# Patient Record
Sex: Female | Born: 1935
Health system: Southern US, Community
[De-identification: ages and names within clinical notes are randomized; demographics above are authoritative.]

## PROBLEM LIST (undated history)

## (undated) DIAGNOSIS — I10 Essential (primary) hypertension: Secondary | ICD-10-CM

## (undated) DIAGNOSIS — Z9289 Personal history of other medical treatment: Secondary | ICD-10-CM

## (undated) DIAGNOSIS — R001 Bradycardia, unspecified: Secondary | ICD-10-CM

## (undated) DIAGNOSIS — Z8719 Personal history of other diseases of the digestive system: Secondary | ICD-10-CM

## (undated) DIAGNOSIS — E785 Hyperlipidemia, unspecified: Secondary | ICD-10-CM

## (undated) DIAGNOSIS — J8410 Pulmonary fibrosis, unspecified: Secondary | ICD-10-CM

## (undated) DIAGNOSIS — D649 Anemia, unspecified: Secondary | ICD-10-CM

## (undated) DIAGNOSIS — Z7982 Long term (current) use of aspirin: Secondary | ICD-10-CM

## (undated) DIAGNOSIS — R0600 Dyspnea, unspecified: Secondary | ICD-10-CM

## (undated) DIAGNOSIS — I517 Cardiomegaly: Secondary | ICD-10-CM

## (undated) DIAGNOSIS — T8859XA Other complications of anesthesia, initial encounter: Secondary | ICD-10-CM

## (undated) DIAGNOSIS — I700 Atherosclerosis of aorta: Secondary | ICD-10-CM

## (undated) DIAGNOSIS — Z8616 Personal history of COVID-19: Secondary | ICD-10-CM

## (undated) DIAGNOSIS — K449 Diaphragmatic hernia without obstruction or gangrene: Secondary | ICD-10-CM

## (undated) DIAGNOSIS — R0902 Hypoxemia: Secondary | ICD-10-CM

## (undated) DIAGNOSIS — R911 Solitary pulmonary nodule: Secondary | ICD-10-CM

## (undated) DIAGNOSIS — U071 COVID-19: Secondary | ICD-10-CM

## (undated) DIAGNOSIS — I272 Pulmonary hypertension, unspecified: Secondary | ICD-10-CM

## (undated) DIAGNOSIS — H409 Unspecified glaucoma: Secondary | ICD-10-CM

## (undated) DIAGNOSIS — L570 Actinic keratosis: Secondary | ICD-10-CM

## (undated) DIAGNOSIS — K219 Gastro-esophageal reflux disease without esophagitis: Secondary | ICD-10-CM

## (undated) DIAGNOSIS — Z9889 Other specified postprocedural states: Secondary | ICD-10-CM

## (undated) DIAGNOSIS — I5189 Other ill-defined heart diseases: Secondary | ICD-10-CM

## (undated) DIAGNOSIS — K5790 Diverticulosis of intestine, part unspecified, without perforation or abscess without bleeding: Secondary | ICD-10-CM

## (undated) DIAGNOSIS — D151 Benign neoplasm of heart: Secondary | ICD-10-CM

## (undated) DIAGNOSIS — T7840XA Allergy, unspecified, initial encounter: Secondary | ICD-10-CM

## (undated) DIAGNOSIS — I639 Cerebral infarction, unspecified: Secondary | ICD-10-CM

## (undated) DIAGNOSIS — R112 Nausea with vomiting, unspecified: Secondary | ICD-10-CM

## (undated) HISTORY — DX: Hypoxemia: R09.02

## (undated) HISTORY — DX: Cerebral infarction, unspecified: I63.9

## (undated) HISTORY — DX: Diaphragmatic hernia without obstruction or gangrene: K44.9

## (undated) HISTORY — PX: TONSILLECTOMY: SUR1361

## (undated) HISTORY — DX: Actinic keratosis: L57.0

## (undated) HISTORY — DX: Unspecified glaucoma: H40.9

## (undated) HISTORY — DX: Personal history of other medical treatment: Z92.89

## (undated) HISTORY — PX: RECTOCELE REPAIR: SHX761

## (undated) HISTORY — PX: BUNIONECTOMY: SHX129

## (undated) HISTORY — DX: Personal history of other diseases of the digestive system: Z87.19

## (undated) HISTORY — DX: COVID-19: U07.1

## (undated) HISTORY — DX: Allergy, unspecified, initial encounter: T78.40XA

## (undated) HISTORY — PX: EYE SURGERY: SHX253

## (undated) HISTORY — DX: Hyperlipidemia, unspecified: E78.5

---

## 1980-01-09 HISTORY — PX: ABDOMINAL HYSTERECTOMY: SHX81

## 1980-01-09 HISTORY — PX: TOTAL ABDOMINAL HYSTERECTOMY: SHX209

## 1988-01-09 DIAGNOSIS — I619 Nontraumatic intracerebral hemorrhage, unspecified: Secondary | ICD-10-CM

## 1988-01-09 DIAGNOSIS — I639 Cerebral infarction, unspecified: Secondary | ICD-10-CM

## 1988-01-09 HISTORY — DX: Cerebral infarction, unspecified: I63.9

## 1988-01-09 HISTORY — DX: Nontraumatic intracerebral hemorrhage, unspecified: I61.9

## 2013-03-06 DIAGNOSIS — H4011X0 Primary open-angle glaucoma, stage unspecified: Secondary | ICD-10-CM | POA: Diagnosis not present

## 2013-03-06 DIAGNOSIS — H2510 Age-related nuclear cataract, unspecified eye: Secondary | ICD-10-CM | POA: Diagnosis not present

## 2013-04-21 DIAGNOSIS — H4011X0 Primary open-angle glaucoma, stage unspecified: Secondary | ICD-10-CM | POA: Diagnosis not present

## 2013-04-23 DIAGNOSIS — Z1231 Encounter for screening mammogram for malignant neoplasm of breast: Secondary | ICD-10-CM | POA: Diagnosis not present

## 2013-05-06 DIAGNOSIS — H43399 Other vitreous opacities, unspecified eye: Secondary | ICD-10-CM | POA: Diagnosis not present

## 2013-05-06 DIAGNOSIS — H4011X0 Primary open-angle glaucoma, stage unspecified: Secondary | ICD-10-CM | POA: Diagnosis not present

## 2013-05-06 DIAGNOSIS — H2510 Age-related nuclear cataract, unspecified eye: Secondary | ICD-10-CM | POA: Diagnosis not present

## 2013-06-25 DIAGNOSIS — Z79899 Other long term (current) drug therapy: Secondary | ICD-10-CM | POA: Diagnosis not present

## 2013-06-25 DIAGNOSIS — E7800 Pure hypercholesterolemia, unspecified: Secondary | ICD-10-CM | POA: Diagnosis not present

## 2013-06-25 DIAGNOSIS — E559 Vitamin D deficiency, unspecified: Secondary | ICD-10-CM | POA: Diagnosis not present

## 2013-06-25 DIAGNOSIS — D509 Iron deficiency anemia, unspecified: Secondary | ICD-10-CM | POA: Diagnosis not present

## 2013-08-13 DIAGNOSIS — H2510 Age-related nuclear cataract, unspecified eye: Secondary | ICD-10-CM | POA: Diagnosis not present

## 2013-08-13 DIAGNOSIS — H4011X0 Primary open-angle glaucoma, stage unspecified: Secondary | ICD-10-CM | POA: Diagnosis not present

## 2013-10-06 DIAGNOSIS — Z23 Encounter for immunization: Secondary | ICD-10-CM | POA: Diagnosis not present

## 2013-10-07 DIAGNOSIS — E538 Deficiency of other specified B group vitamins: Secondary | ICD-10-CM | POA: Diagnosis not present

## 2013-10-07 DIAGNOSIS — E7800 Pure hypercholesterolemia, unspecified: Secondary | ICD-10-CM | POA: Diagnosis not present

## 2013-10-21 DIAGNOSIS — D509 Iron deficiency anemia, unspecified: Secondary | ICD-10-CM | POA: Diagnosis not present

## 2013-11-17 DIAGNOSIS — H4010X0 Unspecified open-angle glaucoma, stage unspecified: Secondary | ICD-10-CM | POA: Diagnosis not present

## 2013-11-17 DIAGNOSIS — H4089 Other specified glaucoma: Secondary | ICD-10-CM | POA: Diagnosis not present

## 2013-11-17 DIAGNOSIS — H269 Unspecified cataract: Secondary | ICD-10-CM | POA: Diagnosis not present

## 2013-11-17 DIAGNOSIS — H2512 Age-related nuclear cataract, left eye: Secondary | ICD-10-CM | POA: Diagnosis not present

## 2013-11-17 DIAGNOSIS — H4011X2 Primary open-angle glaucoma, moderate stage: Secondary | ICD-10-CM | POA: Diagnosis not present

## 2013-11-17 HISTORY — PX: GLAUCOMA SURGERY: SHX656

## 2014-02-12 DIAGNOSIS — H26492 Other secondary cataract, left eye: Secondary | ICD-10-CM | POA: Diagnosis not present

## 2014-03-24 DIAGNOSIS — D649 Anemia, unspecified: Secondary | ICD-10-CM | POA: Diagnosis not present

## 2014-03-24 DIAGNOSIS — E559 Vitamin D deficiency, unspecified: Secondary | ICD-10-CM | POA: Diagnosis not present

## 2014-03-24 DIAGNOSIS — E785 Hyperlipidemia, unspecified: Secondary | ICD-10-CM | POA: Diagnosis not present

## 2014-03-24 DIAGNOSIS — E7219 Other disorders of sulfur-bearing amino-acid metabolism: Secondary | ICD-10-CM | POA: Diagnosis not present

## 2014-04-23 DIAGNOSIS — Z1231 Encounter for screening mammogram for malignant neoplasm of breast: Secondary | ICD-10-CM | POA: Diagnosis not present

## 2014-04-23 LAB — HM MAMMOGRAPHY

## 2014-05-20 DIAGNOSIS — H2513 Age-related nuclear cataract, bilateral: Secondary | ICD-10-CM | POA: Diagnosis not present

## 2014-05-20 DIAGNOSIS — H4011X1 Primary open-angle glaucoma, mild stage: Secondary | ICD-10-CM | POA: Diagnosis not present

## 2014-06-24 DIAGNOSIS — H4011X2 Primary open-angle glaucoma, moderate stage: Secondary | ICD-10-CM | POA: Diagnosis not present

## 2014-06-24 DIAGNOSIS — H4011X1 Primary open-angle glaucoma, mild stage: Secondary | ICD-10-CM | POA: Diagnosis not present

## 2014-06-24 DIAGNOSIS — H40033 Anatomical narrow angle, bilateral: Secondary | ICD-10-CM | POA: Diagnosis not present

## 2014-09-16 DIAGNOSIS — Z961 Presence of intraocular lens: Secondary | ICD-10-CM | POA: Diagnosis not present

## 2014-09-16 DIAGNOSIS — H43393 Other vitreous opacities, bilateral: Secondary | ICD-10-CM | POA: Diagnosis not present

## 2014-09-16 DIAGNOSIS — H4011X2 Primary open-angle glaucoma, moderate stage: Secondary | ICD-10-CM | POA: Diagnosis not present

## 2014-09-16 DIAGNOSIS — H2511 Age-related nuclear cataract, right eye: Secondary | ICD-10-CM | POA: Diagnosis not present

## 2014-09-16 DIAGNOSIS — H40031 Anatomical narrow angle, right eye: Secondary | ICD-10-CM | POA: Diagnosis not present

## 2014-09-21 DIAGNOSIS — H60392 Other infective otitis externa, left ear: Secondary | ICD-10-CM | POA: Diagnosis not present

## 2014-09-21 DIAGNOSIS — E559 Vitamin D deficiency, unspecified: Secondary | ICD-10-CM | POA: Diagnosis not present

## 2014-09-21 DIAGNOSIS — D649 Anemia, unspecified: Secondary | ICD-10-CM | POA: Diagnosis not present

## 2014-09-21 DIAGNOSIS — Z789 Other specified health status: Secondary | ICD-10-CM | POA: Diagnosis not present

## 2014-09-21 DIAGNOSIS — H6123 Impacted cerumen, bilateral: Secondary | ICD-10-CM | POA: Diagnosis not present

## 2014-09-21 DIAGNOSIS — E785 Hyperlipidemia, unspecified: Secondary | ICD-10-CM | POA: Diagnosis not present

## 2014-09-21 DIAGNOSIS — E782 Mixed hyperlipidemia: Secondary | ICD-10-CM | POA: Diagnosis not present

## 2014-09-21 DIAGNOSIS — E7219 Other disorders of sulfur-bearing amino-acid metabolism: Secondary | ICD-10-CM | POA: Diagnosis not present

## 2014-09-21 DIAGNOSIS — H6522 Chronic serous otitis media, left ear: Secondary | ICD-10-CM | POA: Diagnosis not present

## 2014-09-21 DIAGNOSIS — R7982 Elevated C-reactive protein (CRP): Secondary | ICD-10-CM | POA: Diagnosis not present

## 2014-09-24 DIAGNOSIS — H60391 Other infective otitis externa, right ear: Secondary | ICD-10-CM | POA: Diagnosis not present

## 2014-09-24 DIAGNOSIS — Z789 Other specified health status: Secondary | ICD-10-CM | POA: Diagnosis not present

## 2014-09-24 DIAGNOSIS — E237 Disorder of pituitary gland, unspecified: Secondary | ICD-10-CM | POA: Diagnosis not present

## 2014-09-30 DIAGNOSIS — Z789 Other specified health status: Secondary | ICD-10-CM | POA: Diagnosis not present

## 2014-09-30 DIAGNOSIS — H6191 Disorder of right external ear, unspecified: Secondary | ICD-10-CM | POA: Diagnosis not present

## 2014-09-30 DIAGNOSIS — H60391 Other infective otitis externa, right ear: Secondary | ICD-10-CM | POA: Diagnosis not present

## 2014-09-30 DIAGNOSIS — L089 Local infection of the skin and subcutaneous tissue, unspecified: Secondary | ICD-10-CM | POA: Diagnosis not present

## 2014-11-08 DIAGNOSIS — Z23 Encounter for immunization: Secondary | ICD-10-CM | POA: Diagnosis not present

## 2014-11-24 DIAGNOSIS — Z23 Encounter for immunization: Secondary | ICD-10-CM | POA: Diagnosis not present

## 2014-12-23 DIAGNOSIS — H401132 Primary open-angle glaucoma, bilateral, moderate stage: Secondary | ICD-10-CM | POA: Diagnosis not present

## 2014-12-23 DIAGNOSIS — H2511 Age-related nuclear cataract, right eye: Secondary | ICD-10-CM | POA: Diagnosis not present

## 2014-12-23 DIAGNOSIS — H43393 Other vitreous opacities, bilateral: Secondary | ICD-10-CM | POA: Diagnosis not present

## 2014-12-23 DIAGNOSIS — H40031 Anatomical narrow angle, right eye: Secondary | ICD-10-CM | POA: Diagnosis not present

## 2015-02-08 DIAGNOSIS — E559 Vitamin D deficiency, unspecified: Secondary | ICD-10-CM | POA: Diagnosis not present

## 2015-02-08 DIAGNOSIS — E785 Hyperlipidemia, unspecified: Secondary | ICD-10-CM | POA: Diagnosis not present

## 2015-02-08 DIAGNOSIS — R739 Hyperglycemia, unspecified: Secondary | ICD-10-CM | POA: Diagnosis not present

## 2015-02-08 DIAGNOSIS — D649 Anemia, unspecified: Secondary | ICD-10-CM | POA: Diagnosis not present

## 2015-02-08 DIAGNOSIS — E7219 Other disorders of sulfur-bearing amino-acid metabolism: Secondary | ICD-10-CM | POA: Diagnosis not present

## 2015-02-08 DIAGNOSIS — E782 Mixed hyperlipidemia: Secondary | ICD-10-CM | POA: Diagnosis not present

## 2015-02-08 DIAGNOSIS — R5383 Other fatigue: Secondary | ICD-10-CM | POA: Diagnosis not present

## 2015-02-08 LAB — TSH: TSH: 1.91 u[IU]/mL (ref <=5.90)

## 2015-02-08 LAB — LIPID PANEL
Cholesterol: 151 mg/dL (ref 0–200)
HDL: 64 mg/dL (ref 35–70)
LDl/HDL Ratio: 1.2
TRIGLYCERIDES: 56 mg/dL (ref 40–160)

## 2015-02-08 LAB — BASIC METABOLIC PANEL
CREATININE: 0.8 mg/dL (ref <=1.1)
Potassium: 4.3 mmol/L (ref 3.4–5.3)

## 2015-02-08 LAB — HEPATIC FUNCTION PANEL
ALT: 27 U/L (ref 7–35)
AST: 24 U/L (ref 13–35)

## 2015-02-08 LAB — HEMOGLOBIN A1C: HEMOGLOBIN A1C: 5.6

## 2015-03-15 ENCOUNTER — Ambulatory Visit (INDEPENDENT_AMBULATORY_CARE_PROVIDER_SITE_OTHER): Payer: Medicare Other | Admitting: Internal Medicine

## 2015-03-15 ENCOUNTER — Encounter: Payer: Self-pay | Admitting: Internal Medicine

## 2015-03-15 VITALS — BP 110/78 | HR 67 | Temp 97.6°F | Ht 61.25 in | Wt 131.0 lb

## 2015-03-15 DIAGNOSIS — H409 Unspecified glaucoma: Secondary | ICD-10-CM | POA: Diagnosis not present

## 2015-03-15 DIAGNOSIS — E785 Hyperlipidemia, unspecified: Secondary | ICD-10-CM | POA: Diagnosis not present

## 2015-03-15 DIAGNOSIS — Z8673 Personal history of transient ischemic attack (TIA), and cerebral infarction without residual deficits: Secondary | ICD-10-CM

## 2015-03-15 MED ORDER — ATORVASTATIN CALCIUM 20 MG PO TABS: 20.0000 mg | ORAL_TABLET | Freq: Every day | ORAL | Status: DC

## 2015-03-15 NOTE — Progress Notes (Signed)
Pre visit review using our clinic review tool, if applicable. No additional management support is needed unless otherwise documented below in the visit note. 

## 2015-03-15 NOTE — Assessment & Plan Note (Signed)
No residual effect ? If she should be taking baby ASA daily- will discuss this at next visit

## 2015-03-15 NOTE — Assessment & Plan Note (Signed)
Continue Lipitor and Zetia Will check lipid profile at wellness exam Encouraged her to consume a low fat diet

## 2015-03-15 NOTE — Progress Notes (Signed)
HPI  Pt presents to the clinic today to establish care and for management of the conditions listed below. She is transferring care from Dr. Glendell Docker in [place].  HLD: She is taking Lipitor daily and Zetia every other day (due to expense). She has been trying to consume a low fat diet.   Stroke: Hemorrhagic. She has not had any long term effects from this. She is taking a baby ASA daily  [place]: On Timolol and Latanoprost. She has not yet established with an eye doctor here. She plans to go to [facility].  Flu: 10/2014 Tetanus: 2012 Zostovax: unsure Pneumovax: < 5 years ago Prevnar: 10/2014 Mammogram: 04/2014 Pap Smear: 12/2013 Colon Screening: 04/2012 Bone Density: 2015, normal Vision Screening: yearly Dentist: as needed   Past Medical History  Diagnosis Date  . Glaucoma   . Hyperlipidemia   . Stroke ([place])   . History of blood transfusion     Current Outpatient Prescriptions  Medication Sig Dispense Refill  . aspirin 81 MG tablet Take 81 mg by mouth daily.    Marland Kitchen atorvastatin (LIPITOR) 20 MG tablet Take 20 mg by mouth daily.    . Calcium Carbonate-Vitamin D (CALCIUM 500 + D) 500-125 MG-UNIT TABS Take 1 tablet by mouth daily.    . Ergocalciferol (VITAMIN D2) 2000 units TABS Take 1 tablet by mouth daily.    Marland Kitchen ezetimibe (ZETIA) 10 MG tablet Take 10 mg by mouth every other day.    . ferrous sulfate 325 (65 FE) MG tablet Take 325 mg by mouth daily with breakfast.    . Flaxseed, Linseed, (FLAX SEED OIL) 1300 MG CAPS Take 1 capsule by mouth daily.    Marland Kitchen latanoprost (XALATAN) 0.005 % ophthalmic solution Place 1 drop into the right eye at bedtime.    . Multiple Vitamin (MULTIVITAMIN) tablet Take 1 tablet by mouth daily.    . Omega-3 Fatty Acids (FISH OIL) 1200 MG CAPS Take 2,400 mg by mouth 2 (two) times daily.    . timolol (BETIMOL) 0.5 % ophthalmic solution Place 1 drop into the right eye daily.     No current facility-administered medications for this visit.    Allergies   Allergen Reactions  . Bee Venom Anaphylaxis and Hives  . Codeine     Altered mental status  . Morphine And Related     Altered mental status  . Shellfish Allergy Anaphylaxis and Hives    Mainly shrimp  . Macrobid [Nitrofurantoin] Rash    Family History  Problem Relation Age of Onset  . Colon cancer Brother   . Colon cancer Maternal Uncle   . Colon cancer Maternal Grandmother     Social History   Social History  . Marital Status: Married    Spouse Name: N/A  . Number of Children: N/A  . Years of Education: N/A   Occupational History  . Not on file.   Social History Main Topics  . Smoking status: Never Smoker   . Smokeless tobacco: Never Used  . Alcohol Use: No  . Drug Use: Not on file  . Sexual Activity: Not on file   Other Topics Concern  . Not on file   Social History Narrative  . No narrative on file    ROS:  Constitutional: Denies fever, malaise, fatigue, headache or abrupt weight changes.  HEENT: Denies eye pain, eye redness, ear pain, ringing in the ears, wax buildup, runny nose, nasal congestion, bloody nose, or sore throat. Respiratory: Denies difficulty breathing, shortness of  breath, cough or sputum production.   Cardiovascular: Denies chest pain, chest tightness, palpitations or swelling in the hands or feet.  Gastrointestinal: Denies abdominal pain, bloating, constipation, diarrhea or blood in the stool.  GU: Denies frequency, urgency, pain with urination, blood in urine, odor or discharge. Musculoskeletal: Denies decrease in range of motion, difficulty with gait, muscle pain or joint pain and swelling.  Skin: Denies redness, rashes, lesions or ulcercations.  Neurological: Denies dizziness, difficulty with memory, difficulty with speech or problems with balance and coordination.  Psych: Denies anxiety, depression, SI/HI.  No other specific complaints in a complete review of systems (except as listed in HPI above).  PE:  BP 110/78 mmHg  Pulse  67  Temp(Src) 97.6 F (36.4 C) (Oral)  Ht 5' 1.25" (1.556 m)  Wt 131 lb (59.421 kg)  BMI 24.54 kg/m2  SpO2 97%  Wt Readings from Last 3 Encounters:  03/15/15 131 lb (59.421 kg)    General: Appears her stated age, chronically ill appearing in NAD. HEENT: Head: normal shape and size; Eyes: sclera white, no icterus, conjunctiva pink, PERRLA and EOMs intact;  Cardiovascular: Normal rate and rhythm. S1,S2 noted.  No murmur, rubs or gallops noted. No JVD or BLE edema. No carotid bruits noted. Pulmonary/Chest: Normal effort and positive vesicular breath sounds. No respiratory distress. No wheezes, rales or ronchi noted.  Neurological: Alert and oriented.  Psychiatric: Mood and affect normal. Behavior is normal. Judgment and thought content normal.     Assessment and Plan:

## 2015-03-15 NOTE — Assessment & Plan Note (Signed)
Make an appt to establish care with [facility] current eye drops

## 2015-03-15 NOTE — Patient Instructions (Signed)

## 2015-04-04 ENCOUNTER — Ambulatory Visit (INDEPENDENT_AMBULATORY_CARE_PROVIDER_SITE_OTHER): Payer: Medicare Other | Admitting: Family Medicine

## 2015-04-04 ENCOUNTER — Encounter: Payer: Self-pay | Admitting: Family Medicine

## 2015-04-04 VITALS — BP 124/66 | HR 62 | Temp 98.1°F | Wt 133.2 lb

## 2015-04-04 DIAGNOSIS — J069 Acute upper respiratory infection, unspecified: Secondary | ICD-10-CM

## 2015-04-04 MED ORDER — BENZONATATE 100 MG PO CAPS: 100.0000 mg | ORAL_CAPSULE | Freq: Two times a day (BID) | ORAL | Status: DC | PRN

## 2015-04-04 MED ORDER — AZITHROMYCIN 250 MG PO TABS: ORAL_TABLET | ORAL | Status: DC

## 2015-04-04 NOTE — Progress Notes (Signed)
SUBJECTIVE:  Wanda Nelson is a 82 y.o. female pt of Webb Silversmith, new to me, who complains of coryza, congestion, sore throat and productive cough for 4 days. She denies a history of anorexia and chest pain and admits to a history of asthma. Patient denies smoke cigarettes.   Current Outpatient Prescriptions on File Prior to Visit  Medication Sig Dispense Refill  . aspirin 81 MG tablet Take 81 mg by mouth daily.    Marland Kitchen atorvastatin (LIPITOR) 20 MG tablet Take 1 tablet (20 mg total) by mouth daily. 90 tablet 1  . Calcium Carbonate-Vitamin D (CALCIUM 500 + D) 500-125 MG-UNIT TABS Take 1 tablet by mouth daily.    . Ergocalciferol (VITAMIN D2) 2000 units TABS Take 1 tablet by mouth daily.    Marland Kitchen ezetimibe (ZETIA) 10 MG tablet Take 10 mg by mouth every other day.    . ferrous sulfate 325 (65 FE) MG tablet Take 325 mg by mouth daily with breakfast.    . Flaxseed, Linseed, (FLAX SEED OIL) 1300 MG CAPS Take 1 capsule by mouth daily.    Marland Kitchen latanoprost (XALATAN) 0.005 % ophthalmic solution Place 1 drop into the right eye at bedtime.    . Multiple Vitamin (MULTIVITAMIN) tablet Take 1 tablet by mouth daily.    . Omega-3 Fatty Acids (FISH OIL) 1200 MG CAPS Take 2,400 mg by mouth 2 (two) times daily.    . timolol (BETIMOL) 0.5 % ophthalmic solution Place 1 drop into the right eye daily.     No current facility-administered medications on file prior to visit.    Allergies  Allergen Reactions  . Bee Venom Anaphylaxis and Hives  . Codeine     Altered mental status  . Morphine And Related     Altered mental status  . Shellfish Allergy Anaphylaxis and Hives    Mainly shrimp  . Macrobid [Nitrofurantoin] Rash    Past Medical History  Diagnosis Date  . Glaucoma   . Hyperlipidemia   . Stroke ([place])   . History of blood transfusion     Past Surgical History  Procedure Laterality Date  . Tonsillectomy    . Abdominal hysterectomy  1982    total  . Bunionectomy Bilateral   . Rectocele repair       Family History  Problem Relation Age of Onset  . Colon cancer Brother   . Colon cancer Maternal Uncle   . Colon cancer Maternal Grandmother     Social History   Social History  . Marital Status: Married    Spouse Name: N/A  . Number of Children: N/A  . Years of Education: N/A   Occupational History  . Not on file.   Social History Main Topics  . Smoking status: Never Smoker   . Smokeless tobacco: Never Used  . Alcohol Use: No  . Drug Use: No  . Sexual Activity: No   Other Topics Concern  . Not on file   Social History Narrative   The PMH, PSH, Social History, Family History, Medications, and allergies have been reviewed in [facility], and have been updated if relevant.  OBJECTIVE: BP 124/66 mmHg  Pulse 62  Temp(Src) 98.1 F (36.7 C) (Oral)  Wt 133 lb 4 oz (60.442 kg)  SpO2 97%  She appears well, vital signs are as noted. Ears normal.  Throat and pharynx normal.  Neck supple. No adenopathy in the neck. Nose is congested. Sinuses non tender. Diffuse left sided wheezes, ronchi  ASSESSMENT:  bronchitis  PLAN:  Zpack, tessalon as needed for cough. Symptomatic therapy suggested: push fluids, rest and return office visit prn if symptoms persist or worsen.Call or return to clinic prn if these symptoms worsen or fail to improve as anticipated.

## 2015-04-04 NOTE — Progress Notes (Signed)
Pre visit review using our clinic review tool, if applicable. No additional management support is needed unless otherwise documented below in the visit note. 

## 2015-05-09 ENCOUNTER — Other Ambulatory Visit: Payer: Self-pay

## 2015-05-09 MED ORDER — EZETIMIBE 10 MG PO TABS: 10.0000 mg | ORAL_TABLET | ORAL | Status: DC

## 2015-05-09 MED ORDER — FERROUS SULFATE 325 (65 FE) MG PO TABS: 325.0000 mg | ORAL_TABLET | Freq: Every day | ORAL | Status: DC

## 2015-05-09 NOTE — Telephone Encounter (Signed)
Pt left v/m requesting refill zetia and ferrous sulfate to [facility]. Refill done per protocol and left v/m letting pt know that refill sent to [facility] as requested. Established care 03/15/15.

## 2015-05-17 DIAGNOSIS — H2511 Age-related nuclear cataract, right eye: Secondary | ICD-10-CM | POA: Diagnosis not present

## 2015-05-17 DIAGNOSIS — H401130 Primary open-angle glaucoma, bilateral, stage unspecified: Secondary | ICD-10-CM | POA: Diagnosis not present

## 2015-06-03 ENCOUNTER — Telehealth: Payer: Self-pay

## 2015-06-03 NOTE — Telephone Encounter (Signed)
PA submitted through [email or web address] through [employer]--awaiting response

## 2015-06-20 ENCOUNTER — Ambulatory Visit: Payer: Medicare Other | Admitting: Internal Medicine

## 2015-06-23 DIAGNOSIS — H401133 Primary open-angle glaucoma, bilateral, severe stage: Secondary | ICD-10-CM | POA: Diagnosis not present

## 2015-06-23 DIAGNOSIS — H2511 Age-related nuclear cataract, right eye: Secondary | ICD-10-CM | POA: Diagnosis not present

## 2015-06-27 ENCOUNTER — Other Ambulatory Visit: Payer: Self-pay

## 2015-06-27 NOTE — Telephone Encounter (Signed)
That is too much. The 325 mg tablet has 65 mg elemental iron in it. The highest recommended dose in 80 mg per day. If she is taking 2 tabs, she is getting 120 mg elemental iron in it daily which is too much.

## 2015-06-27 NOTE — Telephone Encounter (Signed)
Pt request the ferrous sulfate instructions be changed to take 2 tabs daily; pt said has taken 2 daily since 2013. [facility]; pt last seen 03/15/15. Have not changed instructions until approved by Avie Echevaria NP.

## 2015-06-28 NOTE — Telephone Encounter (Signed)
Pt left v/m requesting cb. 

## 2015-06-29 NOTE — Telephone Encounter (Signed)
Pt reports that when she was so sick previously and had the transfusion--per GI Dr told her to take iron supplement BID and to never stop taking it as instructed--I let pt know of risks of taking too much iron and that I would let her know if a lab only appt is needed if you wanted to check CBC---please advise

## 2015-06-30 ENCOUNTER — Other Ambulatory Visit: Payer: Self-pay | Admitting: Internal Medicine

## 2015-06-30 DIAGNOSIS — D509 Iron deficiency anemia, unspecified: Secondary | ICD-10-CM

## 2015-06-30 NOTE — Telephone Encounter (Signed)
Labs ordered.

## 2015-06-30 NOTE — Telephone Encounter (Signed)
Pt scheduled lab only appt for 07/04/15

## 2015-07-04 ENCOUNTER — Other Ambulatory Visit (INDEPENDENT_AMBULATORY_CARE_PROVIDER_SITE_OTHER): Payer: Medicare Other

## 2015-07-04 DIAGNOSIS — D509 Iron deficiency anemia, unspecified: Secondary | ICD-10-CM

## 2015-07-04 LAB — CBC
HCT: 43.1 % (ref 36.0–46.0)
Hemoglobin: 14.3 g/dL (ref 12.0–15.0)
MCHC: 33.2 g/dL (ref 30.0–36.0)
MCV: 92.8 fl (ref 78.0–100.0)
Platelets: 155 K/uL (ref 150.0–400.0)
RBC: 4.64 Mil/uL (ref 3.87–5.11)
RDW: 13.8 % (ref 11.5–15.5)
WBC: 7.7 K/uL (ref 4.0–10.5)

## 2015-07-04 LAB — IBC PANEL
IRON: 93 ug/dL (ref 42–145)
Saturation Ratios: 31 % (ref 20.0–50.0)
Transferrin: 214 mg/dL (ref 212.0–360.0)

## 2015-07-04 LAB — FERRITIN: Ferritin: 162.7 ng/mL (ref 10.0–291.0)

## 2015-07-05 MED ORDER — FERROUS SULFATE 325 (65 FE) MG PO TABS: 650.0000 mg | ORAL_TABLET | Freq: Every day | ORAL | Status: DC

## 2015-07-05 NOTE — Telephone Encounter (Signed)
[name] (DPR signed) is concerned about decreasing iron; [name] said pt has hiatal hernia and there is a scab like area that could cause loss of blood and that is why pt had been taking 2 iron pills daily. [name] said pt signed request to get endoscopy report for Avie Echevaria NP. [name] request cb after lab result reviewed.

## 2015-07-06 DIAGNOSIS — Z885 Allergy status to narcotic agent status: Secondary | ICD-10-CM | POA: Diagnosis not present

## 2015-07-06 DIAGNOSIS — Z780 Asymptomatic menopausal state: Secondary | ICD-10-CM | POA: Diagnosis not present

## 2015-07-06 DIAGNOSIS — Z9889 Other specified postprocedural states: Secondary | ICD-10-CM | POA: Diagnosis not present

## 2015-07-06 DIAGNOSIS — Z888 Allergy status to other drugs, medicaments and biological substances status: Secondary | ICD-10-CM | POA: Diagnosis not present

## 2015-07-06 DIAGNOSIS — H2511 Age-related nuclear cataract, right eye: Secondary | ICD-10-CM | POA: Diagnosis not present

## 2015-07-06 DIAGNOSIS — E785 Hyperlipidemia, unspecified: Secondary | ICD-10-CM | POA: Diagnosis not present

## 2015-07-06 DIAGNOSIS — I10 Essential (primary) hypertension: Secondary | ICD-10-CM | POA: Diagnosis not present

## 2015-07-06 DIAGNOSIS — Z9842 Cataract extraction status, left eye: Secondary | ICD-10-CM | POA: Diagnosis not present

## 2015-07-06 DIAGNOSIS — Z7982 Long term (current) use of aspirin: Secondary | ICD-10-CM | POA: Diagnosis not present

## 2015-07-06 DIAGNOSIS — Z881 Allergy status to other antibiotic agents status: Secondary | ICD-10-CM | POA: Diagnosis not present

## 2015-07-06 DIAGNOSIS — H401113 Primary open-angle glaucoma, right eye, severe stage: Secondary | ICD-10-CM | POA: Diagnosis not present

## 2015-07-06 DIAGNOSIS — Z79899 Other long term (current) drug therapy: Secondary | ICD-10-CM | POA: Diagnosis not present

## 2015-07-06 HISTORY — PX: TRABECULECTOMY: SHX107

## 2015-07-21 DIAGNOSIS — H401133 Primary open-angle glaucoma, bilateral, severe stage: Secondary | ICD-10-CM | POA: Insufficient documentation

## 2015-09-22 ENCOUNTER — Encounter: Payer: Self-pay | Admitting: Internal Medicine

## 2015-09-22 ENCOUNTER — Ambulatory Visit (INDEPENDENT_AMBULATORY_CARE_PROVIDER_SITE_OTHER): Payer: Medicare Other | Admitting: Internal Medicine

## 2015-09-22 VITALS — BP 118/78 | HR 70 | Temp 97.5°F | Ht 61.5 in | Wt 133.2 lb

## 2015-09-22 DIAGNOSIS — H409 Unspecified glaucoma: Secondary | ICD-10-CM

## 2015-09-22 DIAGNOSIS — Z780 Asymptomatic menopausal state: Secondary | ICD-10-CM

## 2015-09-22 DIAGNOSIS — E785 Hyperlipidemia, unspecified: Secondary | ICD-10-CM | POA: Diagnosis not present

## 2015-09-22 DIAGNOSIS — L989 Disorder of the skin and subcutaneous tissue, unspecified: Secondary | ICD-10-CM | POA: Diagnosis not present

## 2015-09-22 DIAGNOSIS — Z23 Encounter for immunization: Secondary | ICD-10-CM | POA: Diagnosis not present

## 2015-09-22 DIAGNOSIS — Z0000 Encounter for general adult medical examination without abnormal findings: Secondary | ICD-10-CM | POA: Diagnosis not present

## 2015-09-22 DIAGNOSIS — I10 Essential (primary) hypertension: Secondary | ICD-10-CM | POA: Diagnosis not present

## 2015-09-22 DIAGNOSIS — Z8673 Personal history of transient ischemic attack (TIA), and cerebral infarction without residual deficits: Secondary | ICD-10-CM

## 2015-09-22 LAB — LIPID PANEL
CHOL/HDL RATIO: 3
CHOLESTEROL: 172 mg/dL (ref 0–200)
HDL: 61.4 mg/dL (ref >39.00)
LDL Cholesterol: 96 mg/dL (ref 0–99)
NonHDL: 110.94
TRIGLYCERIDES: 75 mg/dL (ref 0.0–149.0)
VLDL: 15 mg/dL (ref 0.0–40.0)

## 2015-09-22 LAB — COMPREHENSIVE METABOLIC PANEL
ALBUMIN: 4 g/dL (ref 3.5–5.2)
ALT: 27 U/L (ref 0–35)
AST: 22 U/L (ref 0–37)
Alkaline Phosphatase: 82 U/L (ref 39–117)
BILIRUBIN TOTAL: 0.8 mg/dL (ref 0.2–1.2)
BUN: 18 mg/dL (ref 6–23)
CALCIUM: 9.3 mg/dL (ref 8.4–10.5)
CHLORIDE: 107 meq/L (ref 96–112)
CO2: 28 meq/L (ref 19–32)
CREATININE: 0.82 mg/dL (ref 0.40–1.20)
GFR: 70.85 mL/min (ref >60.00)
Glucose, Bld: 86 mg/dL (ref 70–99)
Potassium: 4.4 mEq/L (ref 3.5–5.1)
Sodium: 141 mEq/L (ref 135–145)
Total Protein: 7.2 g/dL (ref 6.0–8.3)

## 2015-09-22 LAB — CBC
HCT: 45 % (ref 36.0–46.0)
HEMOGLOBIN: 15.4 g/dL — AB (ref 12.0–15.0)
MCHC: 34.3 g/dL (ref 30.0–36.0)
MCV: 92.6 fl (ref 78.0–100.0)
PLATELETS: 164 10*3/uL (ref 150.0–400.0)
RBC: 4.86 Mil/uL (ref 3.87–5.11)
RDW: 13.3 % (ref 11.5–15.5)
WBC: 5.8 10*3/uL (ref 4.0–10.5)

## 2015-09-22 LAB — VITAMIN D 25 HYDROXY (VIT D DEFICIENCY, FRACTURES): VITD: 88.33 ng/mL (ref 30.00–100.00)

## 2015-09-22 MED ORDER — AZITHROMYCIN 250 MG PO TABS: ORAL_TABLET | ORAL | 0 refills | Status: DC

## 2015-09-22 MED ORDER — CIPROFLOXACIN HCL 250 MG PO TABS: 250.0000 mg | ORAL_TABLET | Freq: Two times a day (BID) | ORAL | 0 refills | Status: DC

## 2015-09-22 NOTE — Assessment & Plan Note (Signed)
Will check CMET and Lipid Profile today Will refill Lipitor after labs are back If she needs something for triglycerides, consider Fenofibrate as Zetia was too expensive Encouraged her to consume a low fat diet

## 2015-09-22 NOTE — Progress Notes (Signed)
Pre visit review using our clinic review tool, if applicable. No additional management support is needed unless otherwise documented below in the visit note. 

## 2015-09-22 NOTE — Assessment & Plan Note (Signed)
I questioned if she should take baby ASA s/p hemorraghic stroke but she reports they advised her in the hospital to take this

## 2015-09-22 NOTE — Progress Notes (Addendum)
HPI:  Pt presents to the clinic today for her Medicare Wellness Exam. She is also due for follow up of chronic conditions.  HLD: She is taking Lipitor every day. She stopped the Zetia because the insurance would not pay for it. She has been trying to consume a low fat diet.    Stroke: Hemorrhagic. She has not had any long term effects from this. She is taking a baby ASA daily   Glu[place]: She reports she had eye surgery 06/2015 at [facility]. She no longer has to use the eye drops.   She also reports she is going to [place] in November. She always take Cipro and Azithromax with her in case she gets travelers diarrhea or a URI. She is asking for RX for both of these today.  Past Medical History:  Diagnosis Date  . Glaucoma   . History of blood transfusion   . Hyperlipidemia   . Stroke [facility])     Current Outpatient Prescriptions  Medication Sig Dispense Refill  . aspirin 81 MG tablet Take 81 mg by mouth daily.    Marland Kitchen atorvastatin (LIPITOR) 20 MG tablet Take 1 tablet (20 mg total) by mouth daily. 90 tablet 1  . azithromycin (ZITHROMAX) 250 MG tablet 2 tabs by mouth on day 1 followed by 1 tab by mouth daily days 2-5 6 tablet 0  . benzonatate (TESSALON) 100 MG capsule Take 1 capsule (100 mg total) by mouth 2 (two) times daily as needed for cough. 20 capsule 0  . Calcium Carbonate-Vitamin D (CALCIUM 500 + D) 500-125 MG-UNIT TABS Take 1 tablet by mouth daily.    . Ergocalciferol (VITAMIN D2) 2000 units TABS Take 1 tablet by mouth daily.    Marland Kitchen ezetimibe (ZETIA) 10 MG tablet Take 1 tablet (10 mg total) by mouth every other day. 15 tablet 6  . ferrous sulfate 325 (65 FE) MG tablet Take 2 tablets (650 mg total) by mouth daily with breakfast. 180 tablet 1  . Flaxseed, Linseed, (FLAX SEED OIL) 1300 MG CAPS Take 1 capsule by mouth daily.    Marland Kitchen latanoprost (XALATAN) 0.005 % ophthalmic solution Place 1 drop into the right eye at bedtime.    . Multiple Vitamin (MULTIVITAMIN) tablet Take 1 tablet by  mouth daily.    . Omega-3 Fatty Acids (FISH OIL) 1200 MG CAPS Take 2,400 mg by mouth 2 (two) times daily.    . timolol (BETIMOL) 0.5 % ophthalmic solution Place 1 drop into the right eye daily.     No current facility-administered medications for this visit.     Allergies  Allergen Reactions  . Bee Venom Anaphylaxis and Hives  . Codeine     Altered mental status  . Morphine And Related     Altered mental status  . Shellfish Allergy Anaphylaxis and Hives    Mainly shrimp  . Macrobid [Nitrofurantoin] Rash    Family History  Problem Relation Age of Onset  . Colon cancer Brother   . Colon cancer Maternal Uncle   . Colon cancer Maternal Grandmother     Social History   Social History  . Marital status: Married    Spouse name: N/A  . Number of children: N/A  . Years of education: N/A   Occupational History  . Not on file.   Social History Main Topics  . Smoking status: Never Smoker  . Smokeless tobacco: Never Used  . Alcohol use No  . Drug use: No  . Sexual activity: No  Other Topics Concern  . Not on file   Social History Narrative  . No narrative on file    Hospitiliaztions: None  Health Maintenance:    Flu: 10/2014  Tetanus: 09/2010  Zostovax: 09/2007  Pneumovax: 09/2007  Prevnar: 10/2014  Mammogram: 04/2014, no longer wants to screen  Pap Smear: 12/2013, no longer wants to screen  Colon Screening: 04/2012, no longer wants to screen  Bone Density: 2015, normal  Vision Screening: yearly  Dentist: yearly    Providers:   PCP: Webb Silversmith, NP-C  Opthalmologist: Dr. Franchot Mimes   I have personally reviewed and have noted:  1. The patient's medical and social history 2. Their use of alcohol, tobacco or illicit drugs 3. Their current medications and supplements 4. The patient's functional ability including ADL's, fall risks, home  safety risks and hearing or visual impairment. 5. Diet and physical activities 6. Evidence for depression or mood  disorder  Subjective:   Review of Systems:   Constitutional: Denies fever, malaise, fatigue, headache or abrupt weight changes.  HEENT: Denies eye pain, eye redness, ear pain, ringing in the ears, wax buildup, runny nose, nasal congestion, bloody nose, or sore throat. Respiratory: Denies difficulty breathing, shortness of breath, cough or sputum production.   Cardiovascular: Denies chest pain, chest tightness, palpitations or swelling in the hands or feet.  Gastrointestinal: Pt reports occasional constipation. Denies abdominal pain, bloating, diarrhea or blood in the stool.  GU: Denies urgency, frequency, pain with urination, burning sensation, blood in urine, odor or discharge. Musculoskeletal: Denies decrease in range of motion, difficulty with gait, muscle pain or joint pain and swelling.  Skin: Pt reports skin lesion of nose and right upper chest. Denies redness, rashes, or ulcercations.  Neurological: Denies dizziness, difficulty with memory, difficulty with speech or problems with balance and coordination.  Psych: Denies anxiety, depression, SI/HI.  No other specific complaints in a complete review of systems (except as listed in HPI above).  Objective:  PE:   BP 118/78 (BP Location: Left Arm, Patient Position: Sitting, Cuff Size: Normal)   Pulse 70   Temp 97.5 F (36.4 C) (Oral)   Ht 5' 1.5" (1.562 m)   Wt 133 lb 4 oz (60.4 kg)   SpO2 96%   BMI 24.77 kg/m   Wt Readings from Last 3 Encounters:  04/04/15 133 lb 4 oz (60.4 kg)  03/15/15 131 lb (59.4 kg)    General: Appears her stated age,  in NAD. Skin: Warm, dry and intact. Scaliness noted on bridge of nose. 0.5 cm round raised, skin colored lesion of right upper chest.  Cardiovascular: Normal rate and rhythm. S1,S2 noted.  No murmur, rubs or gallops noted. No JVD or BLE edema. No carotid bruits noted. Pulmonary/Chest: Normal effort and positive vesicular breath sounds. No respiratory distress. No wheezes, rales or  ronchi noted.  Abdomen: Soft and nontender. Normal bowel sounds.  Musculoskeletal:  Strength 5/5 BUE/BLE. No signs of joint swelling.  Neurological: Alert and oriented.  Psychiatric: Mood and affect normal. Behavior is normal. Judgment and thought content normal.     BMET    Component Value Date/Time   K 4.3 02/08/2015   CREATININE 0.8 02/08/2015    Lipid Panel     Component Value Date/Time   CHOL 151 02/08/2015   TRIG 56 02/08/2015   HDL 64 02/08/2015    CBC    Component Value Date/Time   WBC 7.7 07/04/2015 1426   RBC 4.64 07/04/2015 1426   HGB 14.3 07/04/2015 1426  HCT 43.1 07/04/2015 1426   PLT 155.0 07/04/2015 1426   MCV 92.8 07/04/2015 1426   MCHC 33.2 07/04/2015 1426   RDW 13.8 07/04/2015 1426    Hgb A1C Lab Results  Component Value Date   HGBA1C 5.6 02/08/2015      Assessment and Plan:   Medicare Annual Wellness Visit:  Diet: She does eat meat. She consumes fuits and veggies. She avoids fried foods. She drinks OJ, flavored water and milk. Physical activity: She walks 3.5 miles daily, and step class 2 x week. Depression/mood screen: Negative Hearing: She wears a hearing aid in her left ear. Visual acuity: Grossly normal, performs annual eye exam  ADLs: Capable Fall risk: None Home safety: Good Cognitive evaluation: Intact to orientation, naming, recall and repetition EOL planning: Adv directives, full code/ I agree  Preventative Medicine: Flu shot today. Tetanus, Pneumovax, Prevnar and Zostovax UTD. She no longer wants to do mammogram, pap smear or colon screenings. Bone density due 2020. Encouraged her to consume a balanced diet and exercise regimen. Advised her to see an eye doctor and dentist annually. Will check CBC, CMET, Lipid and Vit D today.   Next appointment: RTC in 1 year, sooner if needed  Skin lesion of nose and right chest:  Referral to dermatology for further evaluation Someone should call you to set up this appt   Webb Silversmith, NP

## 2015-09-22 NOTE — Assessment & Plan Note (Signed)
Improved s/p surgery No longer taking eye drops She will continue to follow with [facility]

## 2015-09-22 NOTE — Patient Instructions (Signed)

## 2015-10-03 ENCOUNTER — Other Ambulatory Visit: Payer: Self-pay | Admitting: Internal Medicine

## 2015-10-05 NOTE — Telephone Encounter (Signed)
Pt left v/m requesting status of atorvastatin refill;pt established 03/15/15 and to reck 6 months; no future appt scheduled. Refilled x 1 with note to call for appt. In v/m pt said would not be at home today. Per DPR left v/m refill done and call for appt.

## 2015-12-14 ENCOUNTER — Ambulatory Visit (INDEPENDENT_AMBULATORY_CARE_PROVIDER_SITE_OTHER): Payer: Medicare Other | Admitting: Primary Care

## 2015-12-14 ENCOUNTER — Encounter: Payer: Self-pay | Admitting: Primary Care

## 2015-12-14 VITALS — BP 124/74 | HR 70 | Temp 97.5°F | Ht 61.25 in | Wt 136.8 lb

## 2015-12-14 DIAGNOSIS — N390 Urinary tract infection, site not specified: Secondary | ICD-10-CM

## 2015-12-14 DIAGNOSIS — R319 Hematuria, unspecified: Secondary | ICD-10-CM | POA: Diagnosis not present

## 2015-12-14 LAB — POC URINALSYSI DIPSTICK (AUTOMATED)
BILIRUBIN UA: NEGATIVE
GLUCOSE UA: NEGATIVE
KETONES UA: NEGATIVE
Nitrite, UA: NEGATIVE
Protein, UA: NEGATIVE
SPEC GRAV UA: 1.01
Urobilinogen, UA: NEGATIVE
pH, UA: 7.5

## 2015-12-14 MED ORDER — CEPHALEXIN 500 MG PO CAPS: 500.0000 mg | ORAL_CAPSULE | Freq: Two times a day (BID) | ORAL | 0 refills | Status: DC

## 2015-12-14 NOTE — Patient Instructions (Signed)
Start Cephalexin antibiotics. Take 1 capsule by mouth twice daily for 7 days.  Ensure you are staying hydrated with water.  Refrain from using incontinence pads if possible.  It was a pleasure meeting you!

## 2015-12-14 NOTE — Addendum Note (Signed)
Addended by: Jacqualin Combes on: 12/14/2015 12:12 PM   Modules accepted: Orders

## 2015-12-14 NOTE — Progress Notes (Signed)
Subjective:    Patient ID: Manfred Arch, female    DOB: 1931-08-16, 82 y.o.   MRN: [id number]  HPI  Ms. Lombardi is an 82 year old female with a history of UTI who presents today with a chief complaint of urinary frequency. She also reports dysuria and low back pain. Her symptoms began yesterday which have progressed today. She denies hematuria, fevers, vaginal discharge. She does travel often and will wear incontinence pads as sometimes she won't be near a bathroom and cannot wait. She did wear one of these pads on Sunday.  Review of Systems  Constitutional: Negative for chills and fever.  Gastrointestinal: Negative for abdominal pain and nausea.  Genitourinary: Positive for dysuria and frequency. Negative for flank pain, hematuria and vaginal discharge.       Past Medical History:  Diagnosis Date  . Glaucoma   . History of blood transfusion   . Hyperlipidemia   . Stroke [facility])      Social History   Social History  . Marital status: Married    Spouse name: N/A  . Number of children: N/A  . Years of education: N/A   Occupational History  . Not on file.   Social History Main Topics  . Smoking status: Never Smoker  . Smokeless tobacco: Never Used  . Alcohol use No  . Drug use: No  . Sexual activity: No   Other Topics Concern  . Not on file   Social History Narrative  . No narrative on file    Past Surgical History:  Procedure Laterality Date  . ABDOMINAL HYSTERECTOMY  1982   total  . BUNIONECTOMY Bilateral   . RECTOCELE REPAIR    . TONSILLECTOMY      Family History  Problem Relation Age of Onset  . Colon cancer Brother   . Colon cancer Maternal Uncle   . Colon cancer Maternal Grandmother     Allergies  Allergen Reactions  . Bee Venom Anaphylaxis and Hives  . Codeine     Altered mental status  . Morphine And Related     Altered mental status  . Shellfish Allergy Anaphylaxis and Hives    Mainly shrimp  . Macrobid [Nitrofurantoin] Rash     Current Outpatient Prescriptions on File Prior to Visit  Medication Sig Dispense Refill  . aspirin 81 MG tablet Take 81 mg by mouth daily.    Marland Kitchen atorvastatin (LIPITOR) 20 MG tablet TAKE 1 TABLET BY MOUTH DAILY 90 tablet 0  . Calcium Carbonate-Vitamin D (CALCIUM 500 + D) 500-125 MG-UNIT TABS Take 1 tablet by mouth daily.    . Ergocalciferol (VITAMIN D2) 2000 units TABS Take 1 tablet by mouth daily.    . ferrous sulfate 325 (65 FE) MG tablet Take 2 tablets (650 mg total) by mouth daily with breakfast. 180 tablet 1  . Flaxseed, Linseed, (FLAX SEED OIL) 1300 MG CAPS Take 1 capsule by mouth daily.    . Multiple Vitamin (MULTIVITAMIN) tablet Take 1 tablet by mouth daily.    . Omega-3 Fatty Acids (FISH OIL) 1200 MG CAPS Take 2,400 mg by mouth 2 (two) times daily.     No current facility-administered medications on file prior to visit.     BP 124/74   Pulse 70   Temp 97.5 F (36.4 C) (Oral)   Ht 5' 1.25" (1.556 m)   Wt 136 lb 12.8 oz (62.1 kg)   SpO2 99%   BMI 25.64 kg/m    Objective:   Physical Exam  Constitutional: She appears well-nourished.  Neck: Neck supple.  Cardiovascular: Normal rate and regular rhythm.   Pulmonary/Chest: Effort normal and breath sounds normal.  Abdominal: Soft. There is no CVA tenderness.  Skin: Skin is warm and dry.          Assessment & Plan:  Urinary Tract Infection:  Frequency and dysuria x 24 hours, worse this morning. History of UTI's. Suspect cause to be secondary to use of incontinence pads. Exam today unremarkable. Does not appear acutely ill. UA: 3+ leuks, 1+ blood, negative nitrites. Culture sent. Rx for Cephalexin course sent to pharmacy.  Push fluids, change incontinence pads often, reduce use if possible. Follow up PRN.  Sheral Flow, NP

## 2015-12-14 NOTE — Progress Notes (Signed)
Pre visit review using our clinic review tool, if applicable. No additional management support is needed unless otherwise documented below in the visit note. 

## 2015-12-16 LAB — URINE CULTURE

## 2016-01-03 ENCOUNTER — Other Ambulatory Visit: Payer: Self-pay | Admitting: Internal Medicine

## 2016-01-05 ENCOUNTER — Ambulatory Visit (INDEPENDENT_AMBULATORY_CARE_PROVIDER_SITE_OTHER): Payer: Medicare Other | Admitting: Internal Medicine

## 2016-01-05 ENCOUNTER — Encounter: Payer: Self-pay | Admitting: Internal Medicine

## 2016-01-05 VITALS — BP 116/78 | HR 80 | Temp 97.7°F | Wt 134.5 lb

## 2016-01-05 DIAGNOSIS — R05 Cough: Secondary | ICD-10-CM | POA: Diagnosis not present

## 2016-01-05 DIAGNOSIS — R059 Cough, unspecified: Secondary | ICD-10-CM

## 2016-01-05 NOTE — Patient Instructions (Signed)
Cough, Adult Introduction A cough helps to clear your throat and lungs. A cough may last only 2-3 weeks (acute), or it may last longer than 8 weeks (chronic). Many different things can cause a cough. A cough may be a sign of an illness or another medical condition. Follow these instructions at home:  Pay attention to any changes in your cough.  Take medicines only as told by your doctor.  If you were prescribed an antibiotic medicine, take it as told by your doctor. Do not stop taking it even if you start to feel better.  Talk with your doctor before you try using a cough medicine.  Drink enough fluid to keep your pee (urine) clear or pale yellow.  If the air is dry, use a cold steam vaporizer or humidifier in your home.  Stay away from things that make you cough at work or at home.  If your cough is worse at night, try using extra pillows to raise your head up higher while you sleep.  Do not smoke, and try not to be around smoke. If you need help quitting, ask your doctor.  Do not have caffeine.  Do not drink alcohol.  Rest as needed. Contact a doctor if:  You have new problems (symptoms).  You cough up yellow fluid (pus).  Your cough does not get better after 2-3 weeks, or your cough gets worse.  Medicine does not help your cough and you are not sleeping well.  You have pain that gets worse or pain that is not helped with medicine.  You have a fever.  You are losing weight and you do not know why.  You have night sweats. Get help right away if:  You cough up blood.  You have trouble breathing.  Your heartbeat is very fast. This information is not intended to replace advice given to you by your health care provider. Make sure you discuss any questions you have with your health care provider. Document Released: 09/07/2010 Document Revised: 06/02/2015 Document Reviewed: 03/03/2014  2017 Elsevier  

## 2016-01-05 NOTE — Progress Notes (Signed)
Subjective:    Patient ID: Wanda Nelson, female    DOB: 26-Jul-1931, 82 y.o.   MRN: [id number]  HPI  Pt presents to the clinic today with c/o a cough. This started 4-5 weeks ago. The cough is dry and hacking. It is not productive. It seems worse in the early afternoon. She denies runny nose, nasal congestion, or sore throat. She denies fever, chills or body aches. She has not taken anything OTC for this. She has not had sick contacts that she is aware of.  Review of Systems      Past Medical History:  Diagnosis Date  . Glaucoma   . History of blood transfusion   . Hyperlipidemia   . Stroke [facility])     Current Outpatient Prescriptions  Medication Sig Dispense Refill  . aspirin 81 MG tablet Take 81 mg by mouth daily.    Marland Kitchen atorvastatin (LIPITOR) 20 MG tablet TAKE 1 TABLET BY MOUTH DAILY 90 tablet 2  . Calcium Carbonate-Vitamin D (CALCIUM 500 + D) 500-125 MG-UNIT TABS Take 1 tablet by mouth daily.    . Ergocalciferol (VITAMIN D2) 2000 units TABS Take 1 tablet by mouth daily.    . ferrous sulfate 325 (65 FE) MG tablet Take 2 tablets (650 mg total) by mouth daily with breakfast. 180 tablet 1  . Flaxseed, Linseed, (FLAX SEED OIL) 1300 MG CAPS Take 1 capsule by mouth daily.    . Multiple Vitamin (MULTIVITAMIN) tablet Take 1 tablet by mouth daily.    . Omega-3 Fatty Acids (FISH OIL) 1200 MG CAPS Take 2,400 mg by mouth 2 (two) times daily.     No current facility-administered medications for this visit.     Allergies  Allergen Reactions  . Bee Venom Anaphylaxis and Hives  . Codeine     Altered mental status  . Morphine And Related     Altered mental status  . Shellfish Allergy Anaphylaxis and Hives    Mainly shrimp  . Macrobid [Nitrofurantoin] Rash    Family History  Problem Relation Age of Onset  . Colon cancer Brother   . Colon cancer Maternal Uncle   . Colon cancer Maternal Grandmother     Social History   Social History  . Marital status: Married    Spouse name:  N/A  . Number of children: N/A  . Years of education: N/A   Occupational History  . Not on file.   Social History Main Topics  . Smoking status: Never Smoker  . Smokeless tobacco: Never Used  . Alcohol use No  . Drug use: No  . Sexual activity: No   Other Topics Concern  . Not on file   Social History Narrative  . No narrative on file     Constitutional: Denies fever, malaise, fatigue, headache or abrupt weight changes.  HEENT: Denies eye pain, eye redness, ear pain, ringing in the ears, wax buildup, runny nose, nasal congestion, bloody nose, or sore throat. Respiratory: Pt reports cough. Denies difficulty breathing, shortness of breath, or sputum production.   Cardiovascular: Denies chest pain, chest tightness, palpitations or swelling in the hands or feet.  Gastrointestinal: Denies abdominal pain, bloating, constipation, diarrhea or blood in the stool.   No other specific complaints in a complete review of systems (except as listed in HPI above).  Objective:   Physical Exam    BP 116/78   Pulse 80   Temp 97.7 F (36.5 C) (Oral)   Wt 134 lb 8 oz (61 kg)  SpO2 98%   BMI 25.21 kg/m  Wt Readings from Last 3 Encounters:  01/05/16 134 lb 8 oz (61 kg)  12/14/15 136 lb 12.8 oz (62.1 kg)  09/22/15 133 lb 4 oz (60.4 kg)    General: Appears her stated age, in NAD. HEENT: Head: normal shape and size, no sinus tenderness noted; Eyes: sclera white, no icterus, conjunctiva pink; Throat/Mouth: Teeth present, mucosa pink and moist, no exudate, lesions or ulcerations noted.  Neck:  No adenopathy noted.  Cardiovascular: Normal rate and rhythm.  Pulmonary/Chest: Normal effort and positive vesicular breath sounds. No respiratory distress. No wheezes, rales or ronchi noted.   BMET    Component Value Date/Time   NA 141 09/22/2015 0902   K 4.4 09/22/2015 0902   CL 107 09/22/2015 0902   CO2 28 09/22/2015 0902   GLUCOSE 86 09/22/2015 0902   BUN 18 09/22/2015 0902   CREATININE  0.82 09/22/2015 0902   CALCIUM 9.3 09/22/2015 0902    Lipid Panel     Component Value Date/Time   CHOL 172 09/22/2015 0902   TRIG 75.0 09/22/2015 0902   HDL 61.40 09/22/2015 0902   CHOLHDL 3 09/22/2015 0902   VLDL 15.0 09/22/2015 0902   LDLCALC 96 09/22/2015 0902    CBC    Component Value Date/Time   WBC 5.8 09/22/2015 0902   RBC 4.86 09/22/2015 0902   HGB 15.4 (H) 09/22/2015 0902   HCT 45.0 09/22/2015 0902   PLT 164.0 09/22/2015 0902   MCV 92.6 09/22/2015 0902   MCHC 34.3 09/22/2015 0902   RDW 13.3 09/22/2015 0902    Hgb A1C Lab Results  Component Value Date   HGBA1C 5.6 02/08/2015          Assessment & Plan:   Cough:  Likely viral No indication for abx Can try cough drops or cough syrup OTC Return precautions discussed  RTC as needed or if symptoms persist or worsen Brown Dunlap, NP

## 2016-01-16 DIAGNOSIS — H401132 Primary open-angle glaucoma, bilateral, moderate stage: Secondary | ICD-10-CM | POA: Diagnosis not present

## 2016-02-17 ENCOUNTER — Ambulatory Visit (INDEPENDENT_AMBULATORY_CARE_PROVIDER_SITE_OTHER): Payer: Medicare Other | Admitting: Family Medicine

## 2016-02-17 ENCOUNTER — Encounter: Payer: Self-pay | Admitting: Family Medicine

## 2016-02-17 VITALS — BP 132/88 | HR 73 | Temp 97.9°F | Wt 136.8 lb

## 2016-02-17 DIAGNOSIS — R319 Hematuria, unspecified: Secondary | ICD-10-CM

## 2016-02-17 DIAGNOSIS — R300 Dysuria: Secondary | ICD-10-CM

## 2016-02-17 DIAGNOSIS — N390 Urinary tract infection, site not specified: Secondary | ICD-10-CM

## 2016-02-17 LAB — POC URINALSYSI DIPSTICK (AUTOMATED)
Bilirubin, UA: NEGATIVE
Blood, UA: 10
Glucose, UA: NEGATIVE
Ketones, UA: NEGATIVE
NITRITE UA: NEGATIVE
PH UA: 6.5
PROTEIN UA: NEGATIVE
Spec Grav, UA: 1.015
Urobilinogen, UA: 0.2

## 2016-02-17 MED ORDER — CEPHALEXIN 500 MG PO CAPS: 500.0000 mg | ORAL_CAPSULE | Freq: Two times a day (BID) | ORAL | 0 refills | Status: DC

## 2016-02-17 NOTE — Progress Notes (Signed)
Dysuria: yes, similar to prev UTI- burning with urination duration of symptoms: several days abdominal pain: occ mild lower abd pain fevers:no back pain:no vomiting:no U/a d/w pt.  ucx pending.   Meds, vitals, and allergies reviewed.   Per HPI unless specifically indicated in ROS section   GEN: nad, alert and oriented HEENT: mucous membranes moist NECK: supple CV: rrr.  PULM: ctab, no inc wob ABD: soft, +bs, suprapubic area tender EXT: no edema SKIN: no acute rash BACK: no CVA pain

## 2016-02-17 NOTE — Patient Instructions (Signed)
Drink plenty of water and start the antibiotics today.  We'll contact you with your lab report.  Take care.   

## 2016-02-17 NOTE — Progress Notes (Signed)
Pre visit review using our clinic review tool, if applicable. No additional management support is needed unless otherwise documented below in the visit note. 

## 2016-02-19 DIAGNOSIS — R300 Dysuria: Secondary | ICD-10-CM | POA: Insufficient documentation

## 2016-02-19 LAB — URINE CULTURE

## 2016-02-19 NOTE — Assessment & Plan Note (Signed)
Likely uncomplicated UTI. Urine culture pending. Start Keflex. Rationale and labs discussed with patient. She agrees. Okay for outpatient follow-up.

## 2016-02-20 DIAGNOSIS — L820 Inflamed seborrheic keratosis: Secondary | ICD-10-CM | POA: Diagnosis not present

## 2016-02-20 DIAGNOSIS — L578 Other skin changes due to chronic exposure to nonionizing radiation: Secondary | ICD-10-CM | POA: Diagnosis not present

## 2016-02-20 DIAGNOSIS — L570 Actinic keratosis: Secondary | ICD-10-CM | POA: Diagnosis not present

## 2016-02-20 DIAGNOSIS — L812 Freckles: Secondary | ICD-10-CM | POA: Diagnosis not present

## 2016-04-09 ENCOUNTER — Ambulatory Visit (INDEPENDENT_AMBULATORY_CARE_PROVIDER_SITE_OTHER): Payer: Medicare Other | Admitting: Primary Care

## 2016-04-09 ENCOUNTER — Encounter: Payer: Self-pay | Admitting: Primary Care

## 2016-04-09 VITALS — BP 140/86 | HR 76 | Temp 97.4°F | Ht 61.25 in | Wt 137.0 lb

## 2016-04-09 DIAGNOSIS — R350 Frequency of micturition: Secondary | ICD-10-CM | POA: Diagnosis not present

## 2016-04-09 LAB — POC URINALSYSI DIPSTICK (AUTOMATED)
Bilirubin, UA: NEGATIVE
GLUCOSE UA: NEGATIVE
Nitrite, UA: NEGATIVE
Protein, UA: NEGATIVE
RBC UA: NEGATIVE
SPEC GRAV UA: 1.01 (ref 1.030–1.035)
UROBILINOGEN UA: NEGATIVE (ref ?–2.0)
pH, UA: 6.5 (ref 5.0–8.0)

## 2016-04-09 MED ORDER — CIPROFLOXACIN HCL 250 MG PO TABS: 250.0000 mg | ORAL_TABLET | Freq: Two times a day (BID) | ORAL | 0 refills | Status: DC

## 2016-04-09 NOTE — Progress Notes (Signed)
Subjective:    Patient ID: Wanda Nelson, female    DOB: 08/31/1931, 83 y.o.   MRN: [id number]  HPI  Wanda Nelson is an 83 year old female with a history of UTI who presents today with a chief complaint of urinary frequency. She also reports dysuria and bilateral lower abdomen/groin pain. Her symptoms began last night. Her last urinary tract infection was in early February 2018. She was treated with Keflex but didn't feel as though this was as effective as her symptoms didn't improve until day 5. She denies flank pain, fevers, hematuria. She's not taken anything OTC but has increased consumption of fluids. She drinks little fluids during the day, and will mostly drink flavored water.  Review of Systems  Constitutional: Negative for fatigue and fever.  Gastrointestinal: Negative for abdominal pain.  Genitourinary: Positive for dysuria, frequency and pelvic pain. Negative for flank pain, hematuria and vaginal discharge.       Past Medical History:  Diagnosis Date  . Glaucoma   . History of blood transfusion   . Hyperlipidemia   . Stroke [facility])      Social History   Social History  . Marital status: Married    Spouse name: N/A  . Number of children: N/A  . Years of education: N/A   Occupational History  . Not on file.   Social History Main Topics  . Smoking status: Never Smoker  . Smokeless tobacco: Never Used  . Alcohol use No  . Drug use: No  . Sexual activity: No   Other Topics Concern  . Not on file   Social History Narrative  . No narrative on file    Past Surgical History:  Procedure Laterality Date  . ABDOMINAL HYSTERECTOMY  1982   total  . BUNIONECTOMY Bilateral   . RECTOCELE REPAIR    . TONSILLECTOMY      Family History  Problem Relation Age of Onset  . Colon cancer Brother   . Colon cancer Maternal Uncle   . Colon cancer Maternal Grandmother     Allergies  Allergen Reactions  . Bee Venom Anaphylaxis and Hives  . Codeine     Altered mental  status  . Morphine And Related     Altered mental status  . Shellfish Allergy Anaphylaxis and Hives    Mainly shrimp  . Macrobid [Nitrofurantoin] Rash    Current Outpatient Prescriptions on File Prior to Visit  Medication Sig Dispense Refill  . aspirin 81 MG tablet Take 81 mg by mouth daily.    Marland Kitchen atorvastatin (LIPITOR) 20 MG tablet TAKE 1 TABLET BY MOUTH DAILY 90 tablet 2  . Calcium Carbonate-Vitamin D (CALCIUM 500 + D) 500-125 MG-UNIT TABS Take 1 tablet by mouth daily.    . Ergocalciferol (VITAMIN D2) 2000 units TABS Take 1 tablet by mouth daily.    . ferrous sulfate 325 (65 FE) MG tablet Take 2 tablets (650 mg total) by mouth daily with breakfast. 180 tablet 1  . Flaxseed, Linseed, (FLAX SEED OIL) 1300 MG CAPS Take 1 capsule by mouth daily.    . Multiple Vitamin (MULTIVITAMIN) tablet Take 1 tablet by mouth daily.    . Omega-3 Fatty Acids (FISH OIL) 1200 MG CAPS Take 2,400 mg by mouth 2 (two) times daily.     No current facility-administered medications on file prior to visit.     BP 140/86   Pulse 76   Temp 97.4 F (36.3 C) (Oral)   Ht 5' 1.25" (1.556 m)  Wt 137 lb (62.1 kg)   SpO2 98%   BMI 25.68 kg/m    Objective:   Physical Exam  Constitutional: She appears well-nourished.  Neck: Neck supple.  Cardiovascular: Normal rate and regular rhythm.   Pulmonary/Chest: Effort normal and breath sounds normal.  Abdominal: There is no tenderness.  Skin: Skin is warm and dry.          Assessment & Plan:   Urinary Tract Infection:  Urinary frequency with dysuria x 24 hours. History of UTI in February 2018, treated with Keflex. UA today: 3+ leuks, negative nitrites, negative blood. Culture sent.  Discussed UTI prevention as this is her third UTI since December 2017. Rx for Cipro course sent to pharmacy. Consider Urology evaluation for recurrent UTI in the future.  Sheral Flow, NP

## 2016-04-09 NOTE — Patient Instructions (Signed)
Start ciprofloxacin antibiotics for urinary tract infection. Take 1 tablet by mouth twice daily for 5 days.  Increase your consumption of water. You should be getting 8 glasses of water daily.  Try taking Cranberry pills once daily for prevention.  It was a pleasure to see you today!

## 2016-04-09 NOTE — Progress Notes (Signed)
Pre visit review using our clinic review tool, if applicable. No additional management support is needed unless otherwise documented below in the visit note. 

## 2016-04-11 LAB — URINE CULTURE

## 2016-05-07 DIAGNOSIS — H401133 Primary open-angle glaucoma, bilateral, severe stage: Secondary | ICD-10-CM | POA: Diagnosis not present

## 2016-05-07 DIAGNOSIS — H401132 Primary open-angle glaucoma, bilateral, moderate stage: Secondary | ICD-10-CM | POA: Diagnosis not present

## 2016-05-21 DIAGNOSIS — D692 Other nonthrombocytopenic purpura: Secondary | ICD-10-CM | POA: Diagnosis not present

## 2016-05-21 DIAGNOSIS — L812 Freckles: Secondary | ICD-10-CM | POA: Diagnosis not present

## 2016-05-21 DIAGNOSIS — L570 Actinic keratosis: Secondary | ICD-10-CM | POA: Diagnosis not present

## 2016-05-21 DIAGNOSIS — L578 Other skin changes due to chronic exposure to nonionizing radiation: Secondary | ICD-10-CM | POA: Diagnosis not present

## 2016-05-21 DIAGNOSIS — L821 Other seborrheic keratosis: Secondary | ICD-10-CM | POA: Diagnosis not present

## 2016-07-23 ENCOUNTER — Ambulatory Visit (INDEPENDENT_AMBULATORY_CARE_PROVIDER_SITE_OTHER): Payer: Medicare Other | Admitting: Podiatry

## 2016-07-23 ENCOUNTER — Encounter: Payer: Self-pay | Admitting: Podiatry

## 2016-07-23 VITALS — BP 144/76 | HR 87

## 2016-07-23 DIAGNOSIS — M79674 Pain in right toe(s): Secondary | ICD-10-CM

## 2016-07-23 DIAGNOSIS — B351 Tinea unguium: Secondary | ICD-10-CM

## 2016-07-23 NOTE — Progress Notes (Signed)
   Subjective:    Patient ID: Wanda Nelson, female    DOB: November 07, 1931, 83 y.o.   MRN: [id number]  HPI this patient presents the office with chief complaint of thick disfigured discolored big toenail, right foot.  She says this nail has become thick and painful in this as she walks and wears her shoes.  She says that there is pain by the end of the day to her right big toe.  She presents the office today for an evaluation and treatment of this painful thickened nail    Review of Systems  All other systems reviewed and are negative.      Objective:   Physical Exam GENERAL APPEARANCE: Alert, conversant. Appropriately groomed. No acute distress.  VASCULAR: Pedal pulses are  palpable at  [facility] and PT bilateral.  Capillary refill time is immediate to all digits,  Normal temperature gradient.   NEUROLOGIC: sensation is normal to 5.07 monofilament at 5/5 sites bilateral.  Light touch is intact bilateral, Muscle strength normal.  MUSCULOSKELETAL: acceptable muscle strength, tone and stability bilateral.  Intrinsic muscluature intact bilateral.  S/P bunion surgery  B/L. Nails  Thick disfigured discolored nails great toenail right. DERMATOLOGIC: skin color, texture, and turgor are within normal limits.  No preulcerative lesions or ulcers  are seen, no interdigital maceration noted.  No open lesions present.  Digital nails are asymptomatic. No drainage noted.         Assessment & Plan:  Onychomycosis  Right  IE  Debride nails.  Discussed conservative vs. Surgical correction.  RTC 3 months   Gardiner Barefoot DPM

## 2016-08-06 ENCOUNTER — Other Ambulatory Visit: Payer: Self-pay | Admitting: Internal Medicine

## 2016-08-06 NOTE — Telephone Encounter (Signed)
Please advise if okay for pt to continue

## 2016-08-17 DIAGNOSIS — H401123 Primary open-angle glaucoma, left eye, severe stage: Secondary | ICD-10-CM | POA: Diagnosis not present

## 2016-08-17 DIAGNOSIS — H401112 Primary open-angle glaucoma, right eye, moderate stage: Secondary | ICD-10-CM | POA: Diagnosis not present

## 2016-08-17 DIAGNOSIS — Z961 Presence of intraocular lens: Secondary | ICD-10-CM | POA: Diagnosis not present

## 2016-09-25 ENCOUNTER — Ambulatory Visit (INDEPENDENT_AMBULATORY_CARE_PROVIDER_SITE_OTHER): Payer: Medicare Other | Admitting: Internal Medicine

## 2016-09-25 ENCOUNTER — Encounter: Payer: Self-pay | Admitting: Internal Medicine

## 2016-09-25 VITALS — BP 122/84 | HR 82 | Temp 97.5°F | Ht 61.25 in | Wt 137.5 lb

## 2016-09-25 DIAGNOSIS — E559 Vitamin D deficiency, unspecified: Secondary | ICD-10-CM

## 2016-09-25 DIAGNOSIS — Z0000 Encounter for general adult medical examination without abnormal findings: Secondary | ICD-10-CM

## 2016-09-25 DIAGNOSIS — Z8673 Personal history of transient ischemic attack (TIA), and cerebral infarction without residual deficits: Secondary | ICD-10-CM

## 2016-09-25 DIAGNOSIS — E7800 Pure hypercholesterolemia, unspecified: Secondary | ICD-10-CM

## 2016-09-25 LAB — CBC
HCT: 44.5 % (ref 36.0–46.0)
HEMOGLOBIN: 14.9 g/dL (ref 12.0–15.0)
MCHC: 33.6 g/dL (ref 30.0–36.0)
MCV: 95.9 fl (ref 78.0–100.0)
PLATELETS: 171 10*3/uL (ref 150.0–400.0)
RBC: 4.64 Mil/uL (ref 3.87–5.11)
RDW: 13.5 % (ref 11.5–15.5)
WBC: 7.7 10*3/uL (ref 4.0–10.5)

## 2016-09-25 LAB — COMPREHENSIVE METABOLIC PANEL
ALT: 17 U/L (ref 0–35)
AST: 19 U/L (ref 0–37)
Albumin: 4 g/dL (ref 3.5–5.2)
Alkaline Phosphatase: 83 U/L (ref 39–117)
BILIRUBIN TOTAL: 0.4 mg/dL (ref 0.2–1.2)
BUN: 19 mg/dL (ref 6–23)
CALCIUM: 9.9 mg/dL (ref 8.4–10.5)
CHLORIDE: 103 meq/L (ref 96–112)
CO2: 30 mEq/L (ref 19–32)
Creatinine, Ser: 0.87 mg/dL (ref 0.40–1.20)
GFR: 66.01 mL/min (ref >60.00)
Glucose, Bld: 117 mg/dL — ABNORMAL HIGH (ref 70–99)
Potassium: 3.9 mEq/L (ref 3.5–5.1)
Sodium: 139 mEq/L (ref 135–145)
Total Protein: 7.3 g/dL (ref 6.0–8.3)

## 2016-09-25 LAB — LIPID PANEL
CHOL/HDL RATIO: 3
CHOLESTEROL: 169 mg/dL (ref 0–200)
HDL: 60.2 mg/dL (ref >39.00)
LDL CALC: 71 mg/dL (ref 0–99)
NonHDL: 109.24
TRIGLYCERIDES: 191 mg/dL — AB (ref 0.0–149.0)
VLDL: 38.2 mg/dL (ref 0.0–40.0)

## 2016-09-25 LAB — VITAMIN D 25 HYDROXY (VIT D DEFICIENCY, FRACTURES): VITD: 72.91 ng/mL (ref 30.00–100.00)

## 2016-09-25 NOTE — Patient Instructions (Signed)
Health Maintenance for Postmenopausal Women Menopause is a normal process in which your reproductive ability comes to an end. This process happens gradually over a span of months to years, usually between the ages of 22 and 9. Menopause is complete when you have missed 12 consecutive menstrual periods. It is important to talk with your health care provider about some of the most common conditions that affect postmenopausal women, such as heart disease, cancer, and bone loss (osteoporosis). Adopting a healthy lifestyle and getting preventive care can help to promote your health and wellness. Those actions can also lower your chances of developing some of these common conditions. What should I know about menopause? During menopause, you may experience a number of symptoms, such as:  Moderate-to-severe hot flashes.  Night sweats.  Decrease in sex drive.  Mood swings.  Headaches.  Tiredness.  Irritability.  Memory problems.  Insomnia.  Choosing to treat or not to treat menopausal changes is an individual decision that you make with your health care provider. What should I know about hormone replacement therapy and supplements? Hormone therapy products are effective for treating symptoms that are associated with menopause, such as hot flashes and night sweats. Hormone replacement carries certain risks, especially as you become older. If you are thinking about using estrogen or estrogen with progestin treatments, discuss the benefits and risks with your health care provider. What should I know about heart disease and stroke? Heart disease, heart attack, and stroke become more likely as you age. This may be due, in part, to the hormonal changes that your body experiences during menopause. These can affect how your body processes dietary fats, triglycerides, and cholesterol. Heart attack and stroke are both medical emergencies. There are many things that you can do to help prevent heart disease  and stroke:  Have your blood pressure checked at least every 1-2 years. High blood pressure causes heart disease and increases the risk of stroke.  If you are 53-22 years old, ask your health care provider if you should take aspirin to prevent a heart attack or a stroke.  Do not use any tobacco products, including cigarettes, chewing tobacco, or electronic cigarettes. If you need help quitting, ask your health care provider.  It is important to eat a healthy diet and maintain a healthy weight. ? Be sure to include plenty of vegetables, fruits, low-fat dairy products, and lean protein. ? Avoid eating foods that are high in solid fats, added sugars, or salt (sodium).  Get regular exercise. This is one of the most important things that you can do for your health. ? Try to exercise for at least 150 minutes each week. The type of exercise that you do should increase your heart rate and make you sweat. This is known as moderate-intensity exercise. ? Try to do strengthening exercises at least twice each week. Do these in addition to the moderate-intensity exercise.  Know your numbers.Ask your health care provider to check your cholesterol and your blood glucose. Continue to have your blood tested as directed by your health care provider.  What should I know about cancer screening? There are several types of cancer. Take the following steps to reduce your risk and to catch any cancer development as early as possible. Breast Cancer  Practice breast self-awareness. ? This means understanding how your breasts normally appear and feel. ? It also means doing regular breast self-exams. Let your health care provider know about any changes, no matter how small.  If you are 40  or older, have a clinician do a breast exam (clinical breast exam or CBE) every year. Depending on your age, family history, and medical history, it may be recommended that you also have a yearly breast X-ray (mammogram).  If you  have a family history of breast cancer, talk with your health care provider about genetic screening.  If you are at high risk for breast cancer, talk with your health care provider about having an MRI and a mammogram every year.  Breast cancer (BRCA) gene test is recommended for women who have family members with BRCA-related cancers. Results of the assessment will determine the need for genetic counseling and BRCA1 and for BRCA2 testing. BRCA-related cancers include these types: ? Breast. This occurs in males or females. ? Ovarian. ? Tubal. This may also be called fallopian tube cancer. ? Cancer of the abdominal or pelvic lining (peritoneal cancer). ? Prostate. ? Pancreatic.  Cervical, Uterine, and Ovarian Cancer Your health care provider may recommend that you be screened regularly for cancer of the pelvic organs. These include your ovaries, uterus, and vagina. This screening involves a pelvic exam, which includes checking for microscopic changes to the surface of your cervix (Pap test).  For women ages 21-65, health care providers may recommend a pelvic exam and a Pap test every three years. For women ages 79-65, they may recommend the Pap test and pelvic exam, combined with testing for human papilloma virus (HPV), every five years. Some types of HPV increase your risk of cervical cancer. Testing for HPV may also be done on women of any age who have unclear Pap test results.  Other health care providers may not recommend any screening for nonpregnant women who are considered low risk for pelvic cancer and have no symptoms. Ask your health care provider if a screening pelvic exam is right for you.  If you have had past treatment for cervical cancer or a condition that could lead to cancer, you need Pap tests and screening for cancer for at least 20 years after your treatment. If Pap tests have been discontinued for you, your risk factors (such as having a new sexual partner) need to be  reassessed to determine if you should start having screenings again. Some women have medical problems that increase the chance of getting cervical cancer. In these cases, your health care provider may recommend that you have screening and Pap tests more often.  If you have a family history of uterine cancer or ovarian cancer, talk with your health care provider about genetic screening.  If you have vaginal bleeding after reaching menopause, tell your health care provider.  There are currently no reliable tests available to screen for ovarian cancer.  Lung Cancer Lung cancer screening is recommended for adults 69-62 years old who are at high risk for lung cancer because of a history of smoking. A yearly low-dose CT scan of the lungs is recommended if you:  Currently smoke.  Have a history of at least 30 pack-years of smoking and you currently smoke or have quit within the past 15 years. A pack-year is smoking an average of one pack of cigarettes per day for one year.  Yearly screening should:  Continue until it has been 15 years since you quit.  Stop if you develop a health problem that would prevent you from having lung cancer treatment.  Colorectal Cancer  This type of cancer can be detected and can often be prevented.  Routine colorectal cancer screening usually begins at  age 42 and continues through age 45.  If you have risk factors for colon cancer, your health care provider may recommend that you be screened at an earlier age.  If you have a family history of colorectal cancer, talk with your health care provider about genetic screening.  Your health care provider may also recommend using home test kits to check for hidden blood in your stool.  A small camera at the end of a tube can be used to examine your colon directly (sigmoidoscopy or colonoscopy). This is done to check for the earliest forms of colorectal cancer.  Direct examination of the colon should be repeated every  5-10 years until age 71. However, if early forms of precancerous polyps or small growths are found or if you have a family history or genetic risk for colorectal cancer, you may need to be screened more often.  Skin Cancer  Check your skin from head to toe regularly.  Monitor any moles. Be sure to tell your health care provider: ? About any new moles or changes in moles, especially if there is a change in a mole's shape or color. ? If you have a mole that is larger than the size of a pencil eraser.  If any of your family members has a history of skin cancer, especially at a young age, talk with your health care provider about genetic screening.  Always use sunscreen. Apply sunscreen liberally and repeatedly throughout the day.  Whenever you are outside, protect yourself by wearing long sleeves, pants, a wide-brimmed hat, and sunglasses.  What should I know about osteoporosis? Osteoporosis is a condition in which bone destruction happens more quickly than new bone creation. After menopause, you may be at an increased risk for osteoporosis. To help prevent osteoporosis or the bone fractures that can happen because of osteoporosis, the following is recommended:  If you are 46-71 years old, get at least 1,000 mg of calcium and at least 600 mg of vitamin D per day.  If you are older than age 55 but younger than age 65, get at least 1,200 mg of calcium and at least 600 mg of vitamin D per day.  If you are older than age 54, get at least 1,200 mg of calcium and at least 800 mg of vitamin D per day.  Smoking and excessive alcohol intake increase the risk of osteoporosis. Eat foods that are rich in calcium and vitamin D, and do weight-bearing exercises several times each week as directed by your health care provider. What should I know about how menopause affects my mental health? Depression may occur at any age, but it is more common as you become older. Common symptoms of depression  include:  Low or sad mood.  Changes in sleep patterns.  Changes in appetite or eating patterns.  Feeling an overall lack of motivation or enjoyment of activities that you previously enjoyed.  Frequent crying spells.  Talk with your health care provider if you think that you are experiencing depression. What should I know about immunizations? It is important that you get and maintain your immunizations. These include:  Tetanus, diphtheria, and pertussis (Tdap) booster vaccine.  Influenza every year before the flu season begins.  Pneumonia vaccine.  Shingles vaccine.  Your health care provider may also recommend other immunizations. This information is not intended to replace advice given to you by your health care provider. Make sure you discuss any questions you have with your health care provider. Document Released: 02/16/2005  Document Revised: 07/15/2015 Document Reviewed: 09/28/2014 Elsevier Interactive Patient Education  2018 [employer]  

## 2016-09-25 NOTE — Progress Notes (Signed)
HPI:  Pt presents to the clinic today for her Medicare Wellness Exam. She is also due to follow up chronic conditions.  HLD with Hx of Stroke: Her last LDL was 96. She is taking Lipitor, and ASA daily as prescribed. She consumes a low fat diet.  Past Medical History:  Diagnosis Date  . Glaucoma   . History of blood transfusion   . Hyperlipidemia   . Stroke [facility])     Current Outpatient Prescriptions  Medication Sig Dispense Refill  . aspirin 81 MG tablet Take 81 mg by mouth daily.    Marland Kitchen atorvastatin (LIPITOR) 20 MG tablet TAKE 1 TABLET BY MOUTH DAILY 90 tablet 2  . Calcium Carbonate-Vitamin D (CALCIUM 500 + D) 500-125 MG-UNIT TABS Take 1 tablet by mouth daily.    . ciprofloxacin (CIPRO) 250 MG tablet Take 1 tablet (250 mg total) by mouth 2 (two) times daily. 10 tablet 0  . Ergocalciferol (VITAMIN D2) 2000 units TABS Take 1 tablet by mouth daily.    . ferrous sulfate 325 (65 FE) MG tablet TAKE 2 TABLETS BY MOUTH DAILY WITH BREAKFAST 180 tablet 2  . Flaxseed, Linseed, (FLAX SEED OIL) 1300 MG CAPS Take 1 capsule by mouth daily.    . Multiple Vitamin (MULTIVITAMIN) tablet Take 1 tablet by mouth daily.    . Omega-3 Fatty Acids (FISH OIL) 1200 MG CAPS Take 2,400 mg by mouth 2 (two) times daily.     No current facility-administered medications for this visit.     Allergies  Allergen Reactions  . Bee Venom Anaphylaxis and Hives  . Codeine     Altered mental status  . Morphine And Related     Altered mental status  . Shellfish Allergy Anaphylaxis and Hives    Mainly shrimp  . Macrobid [Nitrofurantoin] Rash    Family History  Problem Relation Age of Onset  . Colon cancer Brother   . Colon cancer Maternal Uncle   . Colon cancer Maternal Grandmother     Social History   Social History  . Marital status: Married    Spouse name: N/A  . Number of children: N/A  . Years of education: N/A   Occupational History  . Not on file.   Social History Main Topics  . Smoking status:  Never Smoker  . Smokeless tobacco: Never Used  . Alcohol use No  . Drug use: No  . Sexual activity: No   Other Topics Concern  . Not on file   Social History Narrative  . No narrative on file    Hospitiliaztions: None  Health Maintenance:    Flu: 09/2015  Tetanus: 05/2010  Pneumovax: < 5 years ago  Prevnar: 10/2014  Zostavax: she had this, but can not remember when  Mammogram: 04/2014  Pap Smear 12/2013  Bone Density: 04/2014  Colon Screening: 04/2012  Eye Doctor: 08/2016  Dental Exam: dentures   Providers:   PCP: Webb Silversmith, NP-C  Opthalmologist: Dr. Franchot Mimes    I have personally reviewed and have noted:  1. The patient's medical and social history 2. Their use of alcohol, tobacco or illicit drugs 3. Their current medications and supplements 4. The patient's functional ability including ADL's, fall risks, home safety risks and hearing or visual impairment. 5. Diet and physical activities 6. Evidence for depression or mood disorder  Subjective:   Review of Systems:   Constitutional: Denies fever, malaise, fatigue, headache or abrupt weight changes.  HEENT: Denies eye pain, eye redness, ear pain, ringing in  the ears, wax buildup, runny nose, nasal congestion, bloody nose, or sore throat. Respiratory: Denies difficulty breathing, shortness of breath, cough or sputum production.   Cardiovascular: Denies chest pain, chest tightness, palpitations or swelling in the hands or feet.  Gastrointestinal: Denies abdominal pain, bloating, constipation, diarrhea or blood in the stool.  GU: Denies urgency, frequency, pain with urination, burning sensation, blood in urine, odor or discharge. Musculoskeletal: Denies decrease in range of motion, difficulty with gait, muscle pain or joint pain and swelling.  Skin: Denies redness, rashes, lesions or ulcercations.  Neurological: Denies dizziness, difficulty with memory, difficulty with speech or problems with balance and coordination.   Psych: Denies anxiety, depression, SI/HI.  No other specific complaints in a complete review of systems (except as listed in HPI above).  Objective:  PE:   BP 122/84   Pulse 82   Temp (!) 97.5 F (36.4 C) (Oral)   Ht 5' 1.25" (1.556 m)   Wt 137 lb 8 oz (62.4 kg)   SpO2 98%   BMI 25.77 kg/m   Wt Readings from Last 3 Encounters:  04/09/16 137 lb (62.1 kg)  02/17/16 136 lb 12 oz (62 kg)  01/05/16 134 lb 8 oz (61 kg)    General: Appears her stated age, well developed, well nourished in NAD. Skin: Warm, dry and intact.  HEENT: Head: normal shape and size; Eyes: sclera white, no icterus, conjunctiva pink, PERRLA and EOMs iCardiovascular: Normal rate and rhythm. S1,S2 noted.  No murmur, rubs or gallops noted. No JVD or BLE edema.  Pulmonary/Chest: Normal effort and positive vesicular breath sounds. No respiratory distress. No wheezes, rales or ronchi noted.  Musculoskeletal: Strength 5/5 BUE/BLE.  Neurological: Alert and oriented.   Psychiatric: Mood and affect normal. Behavior is normal. Judgment and thought content normal.     BMET    Component Value Date/Time   NA 141 09/22/2015 0902   K 4.4 09/22/2015 0902   CL 107 09/22/2015 0902   CO2 28 09/22/2015 0902   GLUCOSE 86 09/22/2015 0902   BUN 18 09/22/2015 0902   CREATININE 0.82 09/22/2015 0902   CALCIUM 9.3 09/22/2015 0902    Lipid Panel     Component Value Date/Time   CHOL 172 09/22/2015 0902   TRIG 75.0 09/22/2015 0902   HDL 61.40 09/22/2015 0902   CHOLHDL 3 09/22/2015 0902   VLDL 15.0 09/22/2015 0902   LDLCALC 96 09/22/2015 0902    CBC    Component Value Date/Time   WBC 5.8 09/22/2015 0902   RBC 4.86 09/22/2015 0902   HGB 15.4 (H) 09/22/2015 0902   HCT 45.0 09/22/2015 0902   PLT 164.0 09/22/2015 0902   MCV 92.6 09/22/2015 0902   MCHC 34.3 09/22/2015 0902   RDW 13.3 09/22/2015 0902    Hgb A1C Lab Results  Component Value Date   HGBA1C 5.6 02/08/2015      Assessment and Plan:   Medicare  Annual Wellness Visit:  Diet: She does eat meat. She consumes fruits and veggies. She avoids fried foods. She drinks mostly flavored water. Physical activity: She walks 3 miles every morning. Depression/mood screen: Negative Hearing: Intact to whispered voice Visual acuity: Grossly normal, performs annual eye exam  ADLs: Capable Fall risk: None Home safety: Good Cognitive evaluation: Intact to orientation, naming, recall and repetition EOL planning: Adv directives, full code/ I agree  Preventative Medicine: She gets a flu shot at [facility]. Tetanus, pneumovax, prevnar and zostovax UTD. She declines Shingrix. She no longer wants to do  pap smear, mammogram or colonoscopy. Encouraged her to consume a balanced diet and exercise regimen. Advised her to see an eye doctor and dentist annually. Will check CBC, CMET, Lipid and Vit D today.   Next appointment: 1 year, Medicare Wellness Exam   Webb Silversmith, NP

## 2016-09-26 NOTE — Assessment & Plan Note (Signed)
Continue Lipitor, ASA Encouraged her to consume a low fat diet Will monitor

## 2016-09-26 NOTE — Assessment & Plan Note (Signed)
No residual effect.  Continue Lipitor and ASA Will monitor

## 2016-10-02 ENCOUNTER — Other Ambulatory Visit: Payer: Self-pay | Admitting: Internal Medicine

## 2016-10-16 DIAGNOSIS — Z23 Encounter for immunization: Secondary | ICD-10-CM | POA: Diagnosis not present

## 2016-10-22 ENCOUNTER — Ambulatory Visit (INDEPENDENT_AMBULATORY_CARE_PROVIDER_SITE_OTHER): Payer: Medicare Other | Admitting: Podiatry

## 2016-10-22 DIAGNOSIS — M79674 Pain in right toe(s): Secondary | ICD-10-CM

## 2016-10-22 DIAGNOSIS — B351 Tinea unguium: Secondary | ICD-10-CM

## 2016-10-22 NOTE — Progress Notes (Signed)
Complaint:  Visit Type: Patient returns to my office for continued preventative foot care services. Complaint: Patient states" my nails have grown long and thick and become painful to walk and wear shoes"  The patient presents for preventative foot care services. No changes to ROS  Podiatric Exam: Vascular: dorsalis pedis and posterior tibial pulses are palpable bilateral. Capillary return is immediate. Temperature gradient is WNL. Skin turgor WNL  Sensorium: Normal Semmes Weinstein monofilament test. Normal tactile sensation bilaterally. Nail Exam: Pt has thick disfigured discolored nails with subungual debris noted  entire nail hallux right. Ulcer Exam: There is no evidence of ulcer or pre-ulcerative changes or infection. Orthopedic Exam: Muscle tone and strength are WNL. No limitations in general ROM. No crepitus or effusions noted. Foot type and digits show no abnormalities.Painful bunion left foot. Skin: No Porokeratosis. No infection or ulcers  Diagnosis:  Onychomycosis, , Pain in right toe, pain in left toes  Treatment & Plan Procedures and Treatment: Consent by patient was obtained for treatment procedures.   Debridement of mycotic and hypertrophic toenails hallux right foot. and clearing of subungual debris. No ulceration, no infection noted.  Return Visit-Office Procedure: Patient instructed to return to the office for a follow up visit 3 months for continued evaluation and treatment.    Ulani Degrasse DPM 

## 2016-11-19 DIAGNOSIS — L578 Other skin changes due to chronic exposure to nonionizing radiation: Secondary | ICD-10-CM | POA: Diagnosis not present

## 2016-11-19 DIAGNOSIS — L570 Actinic keratosis: Secondary | ICD-10-CM | POA: Diagnosis not present

## 2016-11-19 DIAGNOSIS — D1800 Hemangioma unspecified site: Secondary | ICD-10-CM | POA: Diagnosis not present

## 2016-11-19 DIAGNOSIS — L820 Inflamed seborrheic keratosis: Secondary | ICD-10-CM | POA: Diagnosis not present

## 2016-11-19 DIAGNOSIS — L812 Freckles: Secondary | ICD-10-CM | POA: Diagnosis not present

## 2016-11-19 DIAGNOSIS — L821 Other seborrheic keratosis: Secondary | ICD-10-CM | POA: Diagnosis not present

## 2016-11-19 DIAGNOSIS — L738 Other specified follicular disorders: Secondary | ICD-10-CM | POA: Diagnosis not present

## 2016-12-19 DIAGNOSIS — H401112 Primary open-angle glaucoma, right eye, moderate stage: Secondary | ICD-10-CM | POA: Diagnosis not present

## 2016-12-19 DIAGNOSIS — H401123 Primary open-angle glaucoma, left eye, severe stage: Secondary | ICD-10-CM | POA: Diagnosis not present

## 2016-12-19 DIAGNOSIS — Z961 Presence of intraocular lens: Secondary | ICD-10-CM | POA: Diagnosis not present

## 2017-01-24 ENCOUNTER — Ambulatory Visit: Payer: Medicare Other | Admitting: Podiatry

## 2017-01-31 ENCOUNTER — Encounter: Payer: Self-pay | Admitting: Podiatry

## 2017-01-31 ENCOUNTER — Ambulatory Visit (INDEPENDENT_AMBULATORY_CARE_PROVIDER_SITE_OTHER): Payer: Medicare Other | Admitting: Podiatry

## 2017-01-31 DIAGNOSIS — M79674 Pain in right toe(s): Secondary | ICD-10-CM | POA: Diagnosis not present

## 2017-01-31 DIAGNOSIS — B351 Tinea unguium: Secondary | ICD-10-CM | POA: Diagnosis not present

## 2017-01-31 NOTE — Progress Notes (Signed)
Complaint:  Visit Type: Patient returns to my office for continued preventative foot care services. Complaint: Patient states" my nails have grown long and thick and become painful to walk and wear shoes"  The patient presents for preventative foot care services. No changes to ROS  Podiatric Exam: Vascular: dorsalis pedis and posterior tibial pulses are palpable bilateral. Capillary return is immediate. Temperature gradient is WNL. Skin turgor WNL  Sensorium: Normal Semmes Weinstein monofilament test. Normal tactile sensation bilaterally. Nail Exam: Pt has thick disfigured discolored nails with subungual debris noted  entire nail hallux right. Ulcer Exam: There is no evidence of ulcer or pre-ulcerative changes or infection. Orthopedic Exam: Muscle tone and strength are WNL. No limitations in general ROM. No crepitus or effusions noted. Foot type and digits show no abnormalities.Painful bunion left foot. Skin: No Porokeratosis. No infection or ulcers  Diagnosis:  Onychomycosis, , Pain in right toe, pain in left toes  Treatment & Plan Procedures and Treatment: Consent by patient was obtained for treatment procedures.   Debridement of mycotic and hypertrophic toenails hallux right foot. and clearing of subungual debris. No ulceration, no infection noted.  Return Visit-Office Procedure: Patient instructed to return to the office for a follow up visit 3 months for continued evaluation and treatment.    Joyelle Siedlecki DPM 

## 2017-03-12 ENCOUNTER — Other Ambulatory Visit: Payer: Self-pay | Admitting: Internal Medicine

## 2017-04-17 DIAGNOSIS — Z961 Presence of intraocular lens: Secondary | ICD-10-CM | POA: Diagnosis not present

## 2017-04-17 DIAGNOSIS — H401123 Primary open-angle glaucoma, left eye, severe stage: Secondary | ICD-10-CM | POA: Diagnosis not present

## 2017-04-17 DIAGNOSIS — H401112 Primary open-angle glaucoma, right eye, moderate stage: Secondary | ICD-10-CM | POA: Diagnosis not present

## 2017-05-02 ENCOUNTER — Ambulatory Visit (INDEPENDENT_AMBULATORY_CARE_PROVIDER_SITE_OTHER): Payer: Medicare Other | Admitting: Podiatry

## 2017-05-02 ENCOUNTER — Encounter: Payer: Self-pay | Admitting: Podiatry

## 2017-05-02 DIAGNOSIS — B351 Tinea unguium: Secondary | ICD-10-CM

## 2017-05-02 DIAGNOSIS — M79674 Pain in right toe(s): Secondary | ICD-10-CM | POA: Diagnosis not present

## 2017-05-02 NOTE — Progress Notes (Signed)
Complaint:  Visit Type: Patient returns to my office for continued preventative foot care services. Complaint: Patient states" my nails have grown long and thick and become painful to walk and wear shoes"  The patient presents for preventative foot care services. No changes to ROS  Podiatric Exam: Vascular: dorsalis pedis and posterior tibial pulses are palpable bilateral. Capillary return is immediate. Temperature gradient is WNL. Skin turgor WNL  Sensorium: Normal Semmes Weinstein monofilament test. Normal tactile sensation bilaterally. Nail Exam: Pt has thick disfigured discolored nails with subungual debris noted  entire nail hallux right. Ulcer Exam: There is no evidence of ulcer or pre-ulcerative changes or infection. Orthopedic Exam: Muscle tone and strength are WNL. No limitations in general ROM. No crepitus or effusions noted. Foot type and digits show no abnormalities.Painful bunion left foot. Skin: No Porokeratosis. No infection or ulcers  Diagnosis:  Onychomycosis, , Pain in right toe, pain in left toes  Treatment & Plan Procedures and Treatment: Consent by patient was obtained for treatment procedures.   Debridement of mycotic and hypertrophic toenails hallux right foot. and clearing of subungual debris. No ulceration, no infection noted.  Return Visit-Office Procedure: Patient instructed to return to the office for a follow up visit 3 months for continued evaluation and treatment.    Jerelyn Trimarco DPM 

## 2017-05-11 ENCOUNTER — Emergency Department: Payer: Medicare Other

## 2017-05-11 ENCOUNTER — Emergency Department
Admission: EM | Admit: 2017-05-11 | Discharge: 2017-05-11 | Disposition: A | Discharge status: Home / Self Care | Payer: Medicare Other | Attending: Emergency Medicine | Admitting: Emergency Medicine

## 2017-05-11 ENCOUNTER — Other Ambulatory Visit: Payer: Self-pay

## 2017-05-11 ENCOUNTER — Encounter: Payer: Self-pay | Admitting: Emergency Medicine

## 2017-05-11 DIAGNOSIS — N390 Urinary tract infection, site not specified: Secondary | ICD-10-CM

## 2017-05-11 DIAGNOSIS — R05 Cough: Secondary | ICD-10-CM | POA: Diagnosis not present

## 2017-05-11 DIAGNOSIS — R410 Disorientation, unspecified: Secondary | ICD-10-CM | POA: Diagnosis present

## 2017-05-11 DIAGNOSIS — I10 Essential (primary) hypertension: Secondary | ICD-10-CM | POA: Insufficient documentation

## 2017-05-11 DIAGNOSIS — R4020 Unspecified coma: Secondary | ICD-10-CM | POA: Diagnosis not present

## 2017-05-11 DIAGNOSIS — Z79899 Other long term (current) drug therapy: Secondary | ICD-10-CM | POA: Diagnosis not present

## 2017-05-11 DIAGNOSIS — Z7982 Long term (current) use of aspirin: Secondary | ICD-10-CM | POA: Diagnosis not present

## 2017-05-11 DIAGNOSIS — R300 Dysuria: Secondary | ICD-10-CM | POA: Diagnosis not present

## 2017-05-11 LAB — TROPONIN I: Troponin I: <0.03 ng/mL (ref <0.03)

## 2017-05-11 LAB — CBC WITH DIFFERENTIAL/PLATELET
BASOS ABS: 0 10*3/uL (ref 0–0.1)
BASOS PCT: 0 %
EOS ABS: 0 10*3/uL (ref 0–0.7)
Eosinophils Relative: 0 %
HCT: 40.2 % (ref 35.0–47.0)
HEMOGLOBIN: 13.4 g/dL (ref 12.0–16.0)
Lymphocytes Relative: 5 %
Lymphs Abs: 0.8 10*3/uL — ABNORMAL LOW (ref 1.0–3.6)
MCH: 30.5 pg (ref 26.0–34.0)
MCHC: 33.4 g/dL (ref 32.0–36.0)
MCV: 91.4 fL (ref 80.0–100.0)
MONOS PCT: 6 %
Monocytes Absolute: 1 10*3/uL — ABNORMAL HIGH (ref 0.2–0.9)
NEUTROS ABS: 15.2 10*3/uL — AB (ref 1.4–6.5)
NEUTROS PCT: 89 %
Platelets: 199 10*3/uL (ref 150–440)
RBC: 4.4 MIL/uL (ref 3.80–5.20)
RDW: 13.6 % (ref 11.5–14.5)
WBC: 17 10*3/uL — AB (ref 3.6–11.0)

## 2017-05-11 LAB — URINALYSIS, COMPLETE (UACMP) WITH MICROSCOPIC
BILIRUBIN URINE: NEGATIVE
Glucose, UA: NEGATIVE mg/dL
KETONES UR: NEGATIVE mg/dL
Nitrite: POSITIVE — AB
PH: 7 (ref 5.0–8.0)
Protein, ur: 30 mg/dL — AB
SPECIFIC GRAVITY, URINE: 1.015 (ref 1.005–1.030)
WBC, UA: >50 WBC/hpf — ABNORMAL HIGH (ref 0–5)

## 2017-05-11 LAB — BASIC METABOLIC PANEL
Anion gap: 8 (ref 5–15)
BUN: 22 mg/dL — ABNORMAL HIGH (ref 6–20)
CALCIUM: 8.9 mg/dL (ref 8.9–10.3)
CHLORIDE: 106 mmol/L (ref 101–111)
CO2: 23 mmol/L (ref 22–32)
CREATININE: 0.79 mg/dL (ref 0.44–1.00)
GFR calc Af Amer: >60 mL/min (ref >60)
GFR calc non Af Amer: >60 mL/min (ref >60)
GLUCOSE: 115 mg/dL — AB (ref 65–99)
Potassium: 3.9 mmol/L (ref 3.5–5.1)
Sodium: 137 mmol/L (ref 135–145)

## 2017-05-11 LAB — BRAIN NATRIURETIC PEPTIDE: B Natriuretic Peptide: 75 pg/mL (ref 0.0–100.0)

## 2017-05-11 MED ORDER — SODIUM CHLORIDE 0.9 % IV BOLUS
500.0000 mL | Freq: Once | INTRAVENOUS | Status: AC
  Administered 2017-05-11: 500 mL via INTRAVENOUS

## 2017-05-11 MED ORDER — CEPHALEXIN 500 MG PO CAPS: 500.0000 mg | ORAL_CAPSULE | Freq: Three times a day (TID) | ORAL | 0 refills | Status: AC

## 2017-05-11 MED ORDER — ACETAMINOPHEN 325 MG PO TABS
650.0000 mg | ORAL_TABLET | Freq: Once | ORAL | Status: DC
  Filled 2017-05-11: qty 2

## 2017-05-11 MED ORDER — SODIUM CHLORIDE 0.9 % IV SOLN
1.0000 g | Freq: Once | INTRAVENOUS | Status: AC
  Administered 2017-05-11: 1 g via INTRAVENOUS
  Filled 2017-05-11: qty 10

## 2017-05-11 NOTE — ED Provider Notes (Signed)
[facility] Emergency Department Provider Note  ____________________________________________   I have reviewed the triage vital signs and the nursing notes. Where available I have reviewed prior notes and, if possible and indicated, outside hospital notes.    HISTORY  Chief Complaint Urinary Tract Infection and Cough    HPI Wanda Nelson is a 84 y.o. female states that she has had confusion with UTIs in the past, states she was confused for a brief period this morning.  Her son who is at bedside states that she had a very brief period of seeming to be confused.  No word finding difficulties per se, no headache no focal neurologic deficit, no change in vision or ability to talk to seem to be somewhat confused.  Patient is back to her baseline at this time and has no complaints except for the fact that she has had 2 issues recently one is a slight cough which she feels is getting better which is nonproductive, she has no shortness of breath or chest pain.  The second and most pressing to her is that she has dysuria for 4 days and feels that she likely has another urinary tract infection.  Review of prior cultures from a year ago does indicate that the patient has pansensitive E. coli infections from time to time.  She states that sometimes he gets a little confused with them.  Patient is adamant that she does not want to be admitted to the hospital.  She has no abdominal pain no flank pain no fevers.   Past Medical History:  Diagnosis Date  . Glaucoma   . History of blood transfusion   . Hyperlipidemia   . Stroke [facility])     Patient Active Problem List   Diagnosis Date Noted  . Primary open angle glaucoma of both eyes, severe stage 07/21/2015  . HLD (hyperlipidemia) 03/15/2015  . History of stroke 03/15/2015  . Glaucoma 03/15/2015    Past Surgical History:  Procedure Laterality Date  . ABDOMINAL HYSTERECTOMY  1982   total  . BUNIONECTOMY Bilateral   .  RECTOCELE REPAIR    . TONSILLECTOMY      Prior to Admission medications   Medication Sig Start Date End Date Taking? Authorizing Provider  aspirin 81 MG tablet Take 81 mg by mouth daily.    [provider]  atorvastatin (LIPITOR) 20 MG tablet TAKE 1 TABLET BY MOUTH DAILY 10/02/16   Jearld Fenton, NP  Calcium Carbonate-Vitamin D (CALCIUM 500 + D) 500-125 MG-UNIT TABS Take 1 tablet by mouth daily.    [provider]  Ergocalciferol (VITAMIN D2) 2000 units TABS Take 1 tablet by mouth daily.    [provider]  FEROSUL 325 (65 Fe) MG tablet TAKE 2 TABLETS BY MOUTH DAILY WITH BREAKFAST 03/13/17   Baity, Coralie Keens, NP  Flaxseed, Linseed, (FLAX SEED OIL) 1300 MG CAPS Take 1 capsule by mouth daily.    [provider]  Multiple Vitamin (MULTIVITAMIN) tablet Take 1 tablet by mouth daily.    [provider]  Omega-3 Fatty Acids (FISH OIL) 1200 MG CAPS Take 2,400 mg by mouth 2 (two) times daily.    [provider]    Allergies Bee venom; Codeine; Morphine and related; Shellfish allergy; and Macrobid [nitrofurantoin]  Family History  Problem Relation Age of Onset  . Colon cancer Brother   . Colon cancer Maternal Uncle   . Colon cancer Maternal Grandmother     Social History Social History  Tobacco Use  . Smoking status: Never Smoker  . Smokeless tobacco: Never Used  Substance Use Topics  . Alcohol use: No    Alcohol/week: 0.0 oz  . Drug use: No    Review of Systems Constitutional: No fever/chills Eyes: No visual changes. ENT: No sore throat. No stiff neck no neck pain Cardiovascular: Denies chest pain. Respiratory: Denies shortness of breath. Gastrointestinal:   no vomiting.  No diarrhea.  No constipation. Genitourinary: + for dysuria. Musculoskeletal: Negative lower extremity swelling Skin: Negative for rash. Neurological: Negative for severe headaches, focal weakness or  numbness.   ____________________________________________   PHYSICAL EXAM:  VITAL SIGNS: ED Triage Vitals [05/11/17 1041]  Enc Vitals Group     BP 108/76     Pulse Rate 90     Resp 18     Temp 97.6 F (36.4 C)     Temp Source Oral     SpO2 96 %     Weight 127 lb (57.6 kg)     Height 5\' 2"  (1.575 m)     Head Circumference      Peak Flow      Pain Score 0     Pain Loc      Pain Edu?      Excl. in [place]?     Constitutional: Alert and oriented. Well appearing and in no acute distress. Eyes: Conjunctivae are normal Head: Atraumatic HEENT: No congestion/rhinnorhea. Mucous membranes are moist.  Oropharynx non-erythematous Neck:   Nontender with no meningismus, no masses, no stridor Cardiovascular: Normal rate, regular rhythm. Grossly normal heart sounds.  Good peripheral circulation. Respiratory: Normal respiratory effort.  No retractions. Lungs CTAB. Abdominal: Soft and nontender. No distention. No guarding no rebound Back:  There is no focal tenderness or step off.  there is no midline tenderness there are no lesions noted. there is no CVA tenderness Musculoskeletal: No lower extremity tenderness, no upper extremity tenderness. No joint effusions, no DVT signs strong distal pulses no edema Neurologic:  Normal speech and language. No gross focal neurologic deficits are appreciated.  Skin:  Skin is warm, dry and intact. No rash noted. Psychiatric: Mood and affect are normal. Speech and behavior are normal.  ____________________________________________   LABS (all labs ordered are listed, but only abnormal results are displayed)  Labs Reviewed  URINALYSIS, COMPLETE (UACMP) WITH MICROSCOPIC - Abnormal; Notable for the following components:      Result Value   Color, Urine AMBER (*)    APPearance TURBID (*)    Hgb urine dipstick SMALL (*)    Protein, ur 30 (*)    Nitrite POSITIVE (*)    Leukocytes, UA LARGE (*)    WBC, UA >50 (*)    Bacteria, UA MANY (*)    All other  components within normal limits  CBC WITH DIFFERENTIAL/PLATELET - Abnormal; Notable for the following components:   WBC 17.0 (*)    Neutro Abs 15.2 (*)    Lymphs Abs 0.8 (*)    Monocytes Absolute 1.0 (*)    All other components within normal limits  BASIC METABOLIC PANEL - Abnormal; Notable for the following components:   Glucose, Bld 115 (*)    BUN 22 (*)    All other components within normal limits  URINE CULTURE  TROPONIN I  BRAIN NATRIURETIC PEPTIDE  POC URINE PREG, ED    Pertinent labs  results that were available during my care of the patient were reviewed by me and considered in my medical decision making (  see chart for details). ____________________________________________  EKG  I personally interpreted any EKGs ordered by me or triage Sinus rhythm rate 80 bpm no acute ST elevation depression normal axis unremarkable EKG ____________________________________________  RADIOLOGY  Pertinent labs & imaging results that were available during my care of the patient were reviewed by me and considered in my medical decision making (see chart for details). If possible, patient and/or family made aware of any abnormal findings.  Dg Chest 2 View  Result Date: 05/11/2017 CLINICAL DATA:  Nonproductive cough for the past 10 days. EXAM: CHEST - 2 VIEW COMPARISON:  None. FINDINGS: Mild cardiomegaly. Mild central bronchitic changes with diffusely coarsened interstitial markings. No focal consolidation, pleural effusion, or pneumothorax. No acute osseous abnormality. Hiatal hernia. IMPRESSION: 1. Bronchitic changes.  No consolidation. 2. Diffusely coarsened interstitial markings may reflect underlying interstitial lung disease. Electronically Signed   By: Titus Dubin M.D.   On: 05/11/2017 11:04   Ct Head Wo Contrast  Result Date: 05/11/2017 CLINICAL DATA:  Altered level of consciousness.  No reported injury. EXAM: CT HEAD WITHOUT CONTRAST TECHNIQUE: Contiguous axial images were obtained  from the base of the skull through the vertex without intravenous contrast. COMPARISON:  None. FINDINGS: Brain: No evidence of parenchymal hemorrhage or extra-axial fluid collection. No mass lesion, mass effect, or midline shift. No CT evidence of acute infarction. Nonspecific mild subcortical and periventricular white matter hypodensity, most in keeping with chronic small vessel ischemic change. Cerebral volume is age appropriate. No ventriculomegaly. Vascular: No acute abnormality. Skull: No evidence of calvarial fracture. Sinuses/Orbits: The visualized paranasal sinuses are essentially clear. Other: Complete left mastoid air cell opacification. Partial inferior right mastoid air cell opacification. IMPRESSION: 1.  No evidence of acute intracranial abnormality. 2. Nonspecific complete left and partial right mastoid effusions. Electronically Signed   By: Ilona Sorrel M.D.   On: 05/11/2017 12:23   ____________________________________________    PROCEDURES  Procedure(s) performed: None  Procedures  Critical Care performed: None  ____________________________________________   INITIAL IMPRESSION / ASSESSMENT AND PLAN / ED COURSE  Pertinent labs & imaging results that were available during my care of the patient were reviewed by me and considered in my medical decision making (see chart for details).  Patient here with UTI and apparently transitory confusion this morning.  She is completely awake and alert nontoxic today.  White count is slightly elevated as expected with a urinary tract infection which the patient has.  I feel that is sufficient to cause all of her symptoms.  I did however nonetheless given her history do a CT scan which is reassuring, she remains neurologically intact with an NIH stroke scale 0 does not appear that this was likely a TIA given clear other inciting factors.  I have given her a dose of Rocephin here, which I will culture account should most likely cover.  Patient and  family are adamant that she should go home.  She has 2 help take care of her husband and refuses admission, I do not think that admission is mandatory in this particular case anyway given how well she looks.  She is tolerating p.o. with no difficulty.  We will give her a small IV fluid bolus and if she continues to look as well as she does we will hopefully be able to discharge    ____________________________________________   FINAL CLINICAL IMPRESSION(S) / ED DIAGNOSES  Final diagnoses:  None      This chart was dictated using voice recognition software.  Despite  best efforts to proofread,  errors can occur which can change meaning.      Schuyler Amor, MD 05/11/17 1257

## 2017-05-11 NOTE — ED Notes (Signed)
Pt to Xray.

## 2017-05-11 NOTE — Discharge Instructions (Signed)
We are giving you IV antibiotics here.  We do have a urine culture pending that will tell us whether or not the antibiotic we are giving you will work.  It is our hope that it does.  However, as you prefer not to be admitted to the hospital we must rely on you to be vigilant about your care.  If you have confusion, high fevers, vomiting, or you start to feel worse in any way including lethargy, please return to the emergency department.  If we do need to change your antibiotics we will call you.  Please follow-up with your primary care doctor first thing on Monday.

## 2017-05-11 NOTE — ED Triage Notes (Signed)
Pt presents to ED via [facility] from independent living at [facility]. EMS report initial call was for increased confusion but confusion resolved prior to coming to ED. Pt reports burning with urination x4 days and non-productive cough x10 days. Denies pain. A&Ox4 at this time.

## 2017-05-11 NOTE — ED Notes (Signed)
Pt verbalizes understanding of discharge instructions.

## 2017-05-14 ENCOUNTER — Ambulatory Visit (INDEPENDENT_AMBULATORY_CARE_PROVIDER_SITE_OTHER): Payer: Medicare Other | Admitting: Internal Medicine

## 2017-05-14 ENCOUNTER — Encounter: Payer: Self-pay | Admitting: Internal Medicine

## 2017-05-14 VITALS — BP 120/74 | HR 84 | Temp 97.8°F | Wt 131.0 lb

## 2017-05-14 DIAGNOSIS — R05 Cough: Secondary | ICD-10-CM | POA: Diagnosis not present

## 2017-05-14 DIAGNOSIS — N3000 Acute cystitis without hematuria: Secondary | ICD-10-CM | POA: Diagnosis not present

## 2017-05-14 DIAGNOSIS — R059 Cough, unspecified: Secondary | ICD-10-CM

## 2017-05-14 DIAGNOSIS — R0982 Postnasal drip: Secondary | ICD-10-CM | POA: Diagnosis not present

## 2017-05-14 LAB — URINE CULTURE: Culture: >=100000 — AB

## 2017-05-14 NOTE — Progress Notes (Signed)
Pt presents to the clinic today for ER follow up. She went to the ER 5/4 with c/o confusion. WBC count was 17. Urinalysis was concerning for UTI. Chest xray showed some bronchitic changes but no infiltrate. CT head was negative for acute findings. She was treated with Rocephin IV and discharged with RX for Keflex. Since discharge, she reports her symptoms have resolved. She has been pushing fluids.  She also reports cough. This started about 6 months ago. The cough is productive of clear mucous. She reports associated mild shortness of breath. The cough seems worse at night but it does not keep her up. She denies runny nose, nasal congestion, ear pain, sore throat or chest pain. She does feel like the pollen is a trigger. She is concerned because her recent chest xray showed bronchitic changes. She denies exposure to second hand smoke, fumes etc.   Review of Systems      Past Medical History:  Diagnosis Date  . Glaucoma   . History of blood transfusion   . Hyperlipidemia   . Stroke [facility])     Current Outpatient Medications  Medication Sig Dispense Refill  . aspirin 81 MG tablet Take 81 mg by mouth daily.    Marland Kitchen atorvastatin (LIPITOR) 20 MG tablet TAKE 1 TABLET BY MOUTH DAILY 90 tablet 3  . Calcium Carbonate-Vitamin D (CALCIUM 500 + D) 500-125 MG-UNIT TABS Take 1 tablet by mouth daily.    . cephALEXin (KEFLEX) 500 MG capsule Take 1 capsule (500 mg total) by mouth 3 (three) times daily for 10 days. 28 capsule 0  . cholecalciferol (VITAMIN D) 1000 units tablet Take 1,000 Units by mouth daily.    . FEROSUL 325 (65 Fe) MG tablet TAKE 2 TABLETS BY MOUTH DAILY WITH BREAKFAST (Patient taking differently: Take 1 tablet (325MG ) by mouth twice daily) 180 tablet 1  . Flaxseed, Linseed, (FLAX SEED OIL) 1300 MG CAPS Take 1,100 mg by mouth daily.     . Multiple Vitamin (MULTIVITAMIN) tablet Take 1 tablet by mouth daily.    . Omega-3 Fatty Acids (FISH OIL) 1200 MG CAPS Take 2,400 mg by mouth 2 (two)  times daily.     No current facility-administered medications for this visit.     Allergies  Allergen Reactions  . Bee Venom Anaphylaxis and Hives  . Codeine Other (See Comments)    Altered mental status Altered mental status  . Morphine And Related     Altered mental status  . Shellfish Allergy Anaphylaxis and Hives    Mainly shrimp  . Macrobid [Nitrofurantoin] Rash    Family History  Problem Relation Age of Onset  . Colon cancer Brother   . Colon cancer Maternal Uncle   . Colon cancer Maternal Grandmother     Social History   Socioeconomic History  . Marital status: Married    Spouse name: Not on file  . Number of children: Not on file  . Years of education: Not on file  . Highest education level: Not on file  Occupational History  . Not on file  Social Needs  . Financial resource strain: Not on file  . Food insecurity:    Worry: Not on file    Inability: Not on file  . Transportation needs:    Medical: Not on file    Non-medical: Not on file  Tobacco Use  . Smoking status: Never Smoker  . Smokeless tobacco: Never Used  Substance and Sexual Activity  . Alcohol use: No  Alcohol/week: 0.0 oz  . Drug use: No  . Sexual activity: Never  Lifestyle  . Physical activity:    Days per week: Not on file    Minutes per session: Not on file  . Stress: Not on file  Relationships  . Social connections:    Talks on phone: Not on file    Gets together: Not on file    Attends religious service: Not on file    Active member of club or organization: Not on file    Attends meetings of clubs or organizations: Not on file    Relationship status: Not on file  . Intimate partner violence:    Fear of current or ex partner: Not on file    Emotionally abused: Not on file    Physically abused: Not on file    Forced sexual activity: Not on file  Other Topics Concern  . Not on file  Social History Narrative  . Not on file     Constitutional: Denies fever, malaise,  fatigue, headache or abrupt weight changes.  HEENT: Denies eye pain, eye redness, ear pain, ringing in the ears, wax buildup, runny nose, nasal congestion, bloody nose, or sore throat. Respiratory: Pt reports cough and mild shortness of breath. Denies difficulty breathing.   Cardiovascular: Denies chest pain, chest tightness, palpitations or swelling in the hands or feet.  Gastrointestinal: Denies abdominal pain, bloating, constipation, diarrhea or blood in the stool.  GU: Denies urgency, frequency, pain with urination, burning sensation, blood in urine, odor or discharge. Neurological: Denies dizziness, difficulty with memory, difficulty with speech or problems with balance and coordination.    No other specific complaints in a complete review of systems (except as listed in HPI above).  Objective:   Physical Exam    BP 120/74   Pulse 84   Temp 97.8 F (36.6 C) (Oral)   Wt 131 lb (59.4 kg)   SpO2 96%   BMI 23.96 kg/m  Wt Readings from Last 3 Encounters:  05/14/17 131 lb (59.4 kg)  05/11/17 127 lb (57.6 kg)  09/25/16 137 lb 8 oz (62.4 kg)    General: Appears her stated age, in NAD. HEENT: Throat/Mouth: Teeth present, mucosa pink and moist, + PND, no exudate, lesions or ulcerations noted.  Neck:  No adenopathy noted  Cardiovascular: Normal rate and rhythm. S1,S2 noted.  No murmur, rubs or gallops noted.  Pulmonary/Chest: Normal effort and positive vesicular breath sounds. No respiratory distress. No wheezes, rales or ronchi noted.  Abdomen: Soft and nontender. Normal bowel sounds. No distention or masses noted. No CVA tenderness noted. Neurological: Alert and oriented.    BMET    Component Value Date/Time   NA 137 05/11/2017 1137   K 3.9 05/11/2017 1137   CL 106 05/11/2017 1137   CO2 23 05/11/2017 1137   GLUCOSE 115 (H) 05/11/2017 1137   BUN 22 (H) 05/11/2017 1137   CREATININE 0.79 05/11/2017 1137   CALCIUM 8.9 05/11/2017 1137   GFRNONAA >60 05/11/2017 1137   GFRAA  >60 05/11/2017 1137    Lipid Panel     Component Value Date/Time   CHOL 169 09/25/2016 1530   TRIG 191.0 (H) 09/25/2016 1530   HDL 60.20 09/25/2016 1530   CHOLHDL 3 09/25/2016 1530   VLDL 38.2 09/25/2016 1530   LDLCALC 71 09/25/2016 1530    CBC    Component Value Date/Time   WBC 17.0 (H) 05/11/2017 1137   RBC 4.40 05/11/2017 1137   HGB 13.4 05/11/2017 1137  HCT 40.2 05/11/2017 1137   PLT 199 05/11/2017 1137   MCV 91.4 05/11/2017 1137   MCH 30.5 05/11/2017 1137   MCHC 33.4 05/11/2017 1137   RDW 13.6 05/11/2017 1137   LYMPHSABS 0.8 (L) 05/11/2017 1137   MONOABS 1.0 (H) 05/11/2017 1137   EOSABS 0.0 05/11/2017 1137   BASOSABS 0.0 05/11/2017 1137    Hgb A1C Lab Results  Component Value Date   HGBA1C 5.6 02/08/2015          Assessment & Plan:   ER Follow Up for UTI:  ER notes, labs and imaging reviewed Symptoms resolved Encouraged fluids Finish you course of Keflex  Cough secondary to PND:  Family concerned about bronchitic changes on recent xray Exam benign- no wheezing, rhonchi or rales Try Claritin 10 mg daily QHS x 2 weeks Consider repeat chest xray in 1 month  Return precautions discussed Webb Silversmith, NP

## 2017-05-14 NOTE — Patient Instructions (Signed)

## 2017-05-26 ENCOUNTER — Other Ambulatory Visit: Payer: Self-pay

## 2017-05-26 ENCOUNTER — Ambulatory Visit
Admission: EM | Admit: 2017-05-26 | Discharge: 2017-05-26 | Disposition: A | Discharge status: Home / Self Care | Payer: Medicare Other | Attending: Family Medicine | Admitting: Family Medicine

## 2017-05-26 DIAGNOSIS — Z7982 Long term (current) use of aspirin: Secondary | ICD-10-CM | POA: Diagnosis not present

## 2017-05-26 DIAGNOSIS — H409 Unspecified glaucoma: Secondary | ICD-10-CM | POA: Diagnosis not present

## 2017-05-26 DIAGNOSIS — Z79899 Other long term (current) drug therapy: Secondary | ICD-10-CM | POA: Insufficient documentation

## 2017-05-26 DIAGNOSIS — Z800 Family history of malignant neoplasm of digestive organs: Secondary | ICD-10-CM | POA: Diagnosis not present

## 2017-05-26 DIAGNOSIS — Z8673 Personal history of transient ischemic attack (TIA), and cerebral infarction without residual deficits: Secondary | ICD-10-CM | POA: Insufficient documentation

## 2017-05-26 DIAGNOSIS — R300 Dysuria: Secondary | ICD-10-CM | POA: Diagnosis present

## 2017-05-26 DIAGNOSIS — E785 Hyperlipidemia, unspecified: Secondary | ICD-10-CM | POA: Insufficient documentation

## 2017-05-26 DIAGNOSIS — N3000 Acute cystitis without hematuria: Secondary | ICD-10-CM | POA: Diagnosis not present

## 2017-05-26 DIAGNOSIS — Z881 Allergy status to other antibiotic agents status: Secondary | ICD-10-CM | POA: Diagnosis not present

## 2017-05-26 DIAGNOSIS — Z885 Allergy status to narcotic agent status: Secondary | ICD-10-CM | POA: Diagnosis not present

## 2017-05-26 HISTORY — DX: Anemia, unspecified: D64.9

## 2017-05-26 LAB — URINALYSIS, COMPLETE (UACMP) WITH MICROSCOPIC
BILIRUBIN URINE: NEGATIVE
GLUCOSE, UA: NEGATIVE mg/dL
Ketones, ur: NEGATIVE mg/dL
NITRITE: NEGATIVE
PH: 6.5 (ref 5.0–8.0)
Protein, ur: NEGATIVE mg/dL
SPECIFIC GRAVITY, URINE: 1.01 (ref 1.005–1.030)
Squamous Epithelial / LPF: NONE SEEN (ref 0–5)
WBC, UA: >50 WBC/hpf (ref 0–5)

## 2017-05-26 MED ORDER — CIPROFLOXACIN HCL 500 MG PO TABS: 500.0000 mg | ORAL_TABLET | Freq: Two times a day (BID) | ORAL | 0 refills | Status: DC

## 2017-05-26 NOTE — ED Triage Notes (Signed)
Pt with recurring UTI. Started again yesterday with burning with urination and frequency

## 2017-05-26 NOTE — Discharge Instructions (Signed)
Antibiotic as prescribed.  Take care  Dr. Queen Abbett  

## 2017-05-26 NOTE — ED Provider Notes (Signed)
[facility]  CSN: [id number] Arrival date & time: 05/26/17  5329  History   Chief Complaint Chief Complaint  Patient presents with  . Dysuria   HPI  84 year old female presents with urinary symptoms.  Patient was recently diagnosed and treated for UTI.  She was placed on Keflex.  She was doing well until yesterday when she developed recurrent symptoms.  She reports dysuria and urinary frequency.  No fever.  No chills.  No back pain or flank pain.  No abdominal pain.  She is feeling well otherwise.  Patient has an allergy to nitrofurantoin.  She states that the Keflex does not seem to help as much as other antibiotic.  Patient requesting ciprofloxacin as she states that this works well for her.  Past Medical History:  Diagnosis Date  . Anemia   . Glaucoma   . History of blood transfusion   . Hyperlipidemia   . Stroke [facility])     Patient Active Problem List   Diagnosis Date Noted  . HLD (hyperlipidemia) 03/15/2015  . History of stroke 03/15/2015  . Glaucoma 03/15/2015    Past Surgical History:  Procedure Laterality Date  . ABDOMINAL HYSTERECTOMY  1982   total  . BUNIONECTOMY Bilateral   . RECTOCELE REPAIR    . TONSILLECTOMY      OB History   None      Home Medications    Prior to Admission medications   Medication Sig Start Date End Date Taking? Authorizing Provider  aspirin 81 MG tablet Take 81 mg by mouth daily.    [provider]  atorvastatin (LIPITOR) 20 MG tablet TAKE 1 TABLET BY MOUTH DAILY 10/02/16   Jearld Fenton, NP  Calcium Carbonate-Vitamin D (CALCIUM 500 + D) 500-125 MG-UNIT TABS Take 1 tablet by mouth daily.    [provider]  cholecalciferol (VITAMIN D) 1000 units tablet Take 1,000 Units by mouth daily.    [provider]  ciprofloxacin (CIPRO) 500 MG tablet Take 1 tablet (500 mg total) by mouth 2 (two) times daily. 05/26/17   Avyn Coate G, DO  FEROSUL 325 (65 Fe) MG tablet TAKE 2 TABLETS BY MOUTH DAILY WITH  BREAKFAST Patient taking differently: Take 1 tablet (325MG ) by mouth twice daily 03/13/17   Baity, Coralie Keens, NP  Flaxseed, Linseed, (FLAX SEED OIL) 1300 MG CAPS Take 1,100 mg by mouth daily.     [provider]  Multiple Vitamin (MULTIVITAMIN) tablet Take 1 tablet by mouth daily.    [provider]  Omega-3 Fatty Acids (FISH OIL) 1200 MG CAPS Take 2,400 mg by mouth 2 (two) times daily.    [provider]    Family History Family History  Problem Relation Age of Onset  . Colon cancer Brother   . Colon cancer Maternal Uncle   . Colon cancer Maternal Grandmother     Social History Social History   Tobacco Use  . Smoking status: Never Smoker  . Smokeless tobacco: Never Used  Substance Use Topics  . Alcohol use: No    Alcohol/week: 0.0 oz  . Drug use: No     Allergies   Bee venom; Codeine; Morphine and related; Shellfish allergy; and Macrobid [nitrofurantoin]   Review of Systems Review of Systems  Constitutional: Negative.   Gastrointestinal: Negative.   Genitourinary: Positive for dysuria and frequency.   Physical Exam Triage Vital Signs ED Triage Vitals  Enc Vitals Group     BP 05/26/17 1007 (!) 136/91  Pulse Rate 05/26/17 1007 87     Resp 05/26/17 1007 16     Temp 05/26/17 1007 97.6 F (36.4 C)     Temp Source 05/26/17 1007 Oral     SpO2 05/26/17 1007 98 %     Weight 05/26/17 1009 131 lb (59.4 kg)     Height 05/26/17 1009 5\' 2"  (1.575 m)     Head Circumference --      Peak Flow --      Pain Score 05/26/17 1009 0     Pain Loc --      Pain Edu? --      Excl. in [place]? --    Updated Vital Signs BP (!) 136/91 (BP Location: Left Arm)   Pulse 87   Temp 97.6 F (36.4 C) (Oral)   Resp 16   Ht 5\' 2"  (1.575 m)   Wt 131 lb (59.4 kg)   SpO2 98%   BMI 23.96 kg/m   Physical Exam  Constitutional: She is oriented to person, place, and time. She appears well-developed. No distress.  Cardiovascular: Normal rate and regular rhythm.    Pulmonary/Chest: Effort normal and breath sounds normal. She has no wheezes. She has no rales.  Abdominal: Soft. She exhibits no distension. There is no tenderness.  Neurological: She is alert and oriented to person, place, and time.  Psychiatric: She has a normal mood and affect. Her behavior is normal.  Nursing note and vitals reviewed.  UC Treatments / Results  Labs (all labs ordered are listed, but only abnormal results are displayed) Labs Reviewed  URINALYSIS, COMPLETE (UACMP) WITH MICROSCOPIC - Abnormal; Notable for the following components:      Result Value   Hgb urine dipstick SMALL (*)    Leukocytes, UA LARGE (*)    Bacteria, UA RARE (*)    All other components within normal limits  URINE CULTURE    EKG None  Radiology No results found.  Procedures Procedures (including critical care time)  Medications Ordered in UC Medications - No data to display  Initial Impression / Assessment and Plan / UC Course  I have reviewed the triage vital signs and the nursing notes.  Pertinent labs & imaging results that were available during my care of the patient were reviewed by me and considered in my medical decision making (see chart for details).    84 year old female presents with recurrent UTI.  Given recurrence after recent diagnosis and treatment as well as allergy to nitrofurantoin, I am placing her on ciprofloxacin.  Sending culture.  Final Clinical Impressions(s) / UC Diagnoses   Final diagnoses:  Acute cystitis without hematuria     Discharge Instructions     Antibiotic as prescribed.  Take care  Dr. Lacinda Axon    ED Prescriptions    Medication Sig Dispense Auth. Provider   ciprofloxacin (CIPRO) 500 MG tablet Take 1 tablet (500 mg total) by mouth 2 (two) times daily. 14 tablet Coral Spikes, DO     Controlled Substance Prescriptions [place] Controlled Substance Registry consulted? Not Applicable   Coral Spikes, DO 05/26/17 1102

## 2017-05-27 DIAGNOSIS — L719 Rosacea, unspecified: Secondary | ICD-10-CM | POA: Diagnosis not present

## 2017-05-27 DIAGNOSIS — L570 Actinic keratosis: Secondary | ICD-10-CM | POA: Diagnosis not present

## 2017-05-27 DIAGNOSIS — D692 Other nonthrombocytopenic purpura: Secondary | ICD-10-CM | POA: Diagnosis not present

## 2017-05-27 DIAGNOSIS — Z1283 Encounter for screening for malignant neoplasm of skin: Secondary | ICD-10-CM | POA: Diagnosis not present

## 2017-05-27 DIAGNOSIS — L821 Other seborrheic keratosis: Secondary | ICD-10-CM | POA: Diagnosis not present

## 2017-05-27 DIAGNOSIS — L578 Other skin changes due to chronic exposure to nonionizing radiation: Secondary | ICD-10-CM | POA: Diagnosis not present

## 2017-05-27 DIAGNOSIS — D229 Melanocytic nevi, unspecified: Secondary | ICD-10-CM | POA: Diagnosis not present

## 2017-05-27 DIAGNOSIS — D1801 Hemangioma of skin and subcutaneous tissue: Secondary | ICD-10-CM | POA: Diagnosis not present

## 2017-05-29 LAB — URINE CULTURE

## 2017-07-31 ENCOUNTER — Other Ambulatory Visit: Payer: Self-pay | Admitting: Internal Medicine

## 2017-08-01 ENCOUNTER — Ambulatory Visit: Payer: Medicare Other | Admitting: Podiatry

## 2017-08-05 ENCOUNTER — Ambulatory Visit (INDEPENDENT_AMBULATORY_CARE_PROVIDER_SITE_OTHER): Payer: Medicare Other | Admitting: Podiatry

## 2017-08-05 ENCOUNTER — Encounter: Payer: Self-pay | Admitting: Podiatry

## 2017-08-05 DIAGNOSIS — B351 Tinea unguium: Secondary | ICD-10-CM | POA: Diagnosis not present

## 2017-08-05 DIAGNOSIS — M79674 Pain in right toe(s): Principal | ICD-10-CM

## 2017-08-05 DIAGNOSIS — M79676 Pain in unspecified toe(s): Secondary | ICD-10-CM

## 2017-08-05 NOTE — Progress Notes (Signed)
Complaint:  Visit Type: Patient returns to my office for continued preventative foot care services. Complaint: Patient states" my nails have grown long and thick and become painful to walk and wear shoes"  The patient presents for preventative foot care services. No changes to ROS  Podiatric Exam: Vascular: dorsalis pedis and posterior tibial pulses are palpable bilateral. Capillary return is immediate. Temperature gradient is WNL. Skin turgor WNL  Sensorium: Normal Semmes Weinstein monofilament test. Normal tactile sensation bilaterally. Nail Exam: Pt has thick disfigured discolored nails with subungual debris noted  entire nail hallux right. Ulcer Exam: There is no evidence of ulcer or pre-ulcerative changes or infection. Orthopedic Exam: Muscle tone and strength are WNL. No limitations in general ROM. No crepitus or effusions noted. Foot type and digits show no abnormalities.Painful bunion left foot. Skin: No Porokeratosis. No infection or ulcers  Diagnosis:  Onychomycosis, , Pain in right toe, pain in left toes  Treatment & Plan Procedures and Treatment: Consent by patient was obtained for treatment procedures.   Debridement of mycotic and hypertrophic toenails hallux right foot. and clearing of subungual debris. No ulceration, no infection noted.  Return Visit-Office Procedure: Patient instructed to return to the office for a follow up visit 3 months for continued evaluation and treatment.    Gardiner Barefoot DPM

## 2017-08-21 DIAGNOSIS — H401123 Primary open-angle glaucoma, left eye, severe stage: Secondary | ICD-10-CM | POA: Diagnosis not present

## 2017-08-21 DIAGNOSIS — H401112 Primary open-angle glaucoma, right eye, moderate stage: Secondary | ICD-10-CM | POA: Diagnosis not present

## 2017-08-21 DIAGNOSIS — Z961 Presence of intraocular lens: Secondary | ICD-10-CM | POA: Diagnosis not present

## 2017-09-30 ENCOUNTER — Other Ambulatory Visit: Payer: Self-pay | Admitting: Internal Medicine

## 2017-10-24 DIAGNOSIS — Z23 Encounter for immunization: Secondary | ICD-10-CM | POA: Diagnosis not present

## 2017-10-31 ENCOUNTER — Other Ambulatory Visit: Payer: Self-pay | Admitting: Internal Medicine

## 2017-11-04 ENCOUNTER — Encounter: Payer: Self-pay | Admitting: Podiatry

## 2017-11-04 ENCOUNTER — Ambulatory Visit (INDEPENDENT_AMBULATORY_CARE_PROVIDER_SITE_OTHER): Payer: Medicare Other | Admitting: Podiatry

## 2017-11-04 DIAGNOSIS — B351 Tinea unguium: Secondary | ICD-10-CM

## 2017-11-04 DIAGNOSIS — M79674 Pain in right toe(s): Secondary | ICD-10-CM | POA: Diagnosis not present

## 2017-11-04 NOTE — Progress Notes (Signed)
Complaint:  Visit Type: Patient returns to my office for continued preventative foot care services. Complaint: Patient states" my nails have grown long and thick and become painful to walk and wear shoes"  The patient presents for preventative foot care services. No changes to ROS  Podiatric Exam: Vascular: dorsalis pedis and posterior tibial pulses are palpable bilateral. Capillary return is immediate. Temperature gradient is WNL. Skin turgor WNL  Sensorium: Normal Semmes Weinstein monofilament test. Normal tactile sensation bilaterally. Nail Exam: Pt has thick disfigured discolored nails with subungual debris noted  entire nail hallux right. Ulcer Exam: There is no evidence of ulcer or pre-ulcerative changes or infection. Orthopedic Exam: Muscle tone and strength are WNL. No limitations in general ROM. No crepitus or effusions noted. Foot type and digits show no abnormalities.Painful bunion left foot. Skin: No Porokeratosis. No infection or ulcers  Diagnosis:  Onychomycosis, , Pain in right toe, pain in left toes  Treatment & Plan Procedures and Treatment: Consent by patient was obtained for treatment procedures.   Debridement of mycotic and hypertrophic toenails hallux right foot. and clearing of subungual debris. No ulceration, no infection noted.  Return Visit-Office Procedure: Patient instructed to return to the office for a follow up visit 3 months for continued evaluation and treatment.    Gardiner Barefoot DPM

## 2017-11-27 ENCOUNTER — Other Ambulatory Visit: Payer: Self-pay

## 2017-11-28 ENCOUNTER — Other Ambulatory Visit: Payer: Self-pay | Admitting: Internal Medicine

## 2017-12-02 DIAGNOSIS — L570 Actinic keratosis: Secondary | ICD-10-CM | POA: Diagnosis not present

## 2017-12-02 DIAGNOSIS — B009 Herpesviral infection, unspecified: Secondary | ICD-10-CM | POA: Diagnosis not present

## 2017-12-02 DIAGNOSIS — L578 Other skin changes due to chronic exposure to nonionizing radiation: Secondary | ICD-10-CM | POA: Diagnosis not present

## 2017-12-11 ENCOUNTER — Ambulatory Visit (INDEPENDENT_AMBULATORY_CARE_PROVIDER_SITE_OTHER): Payer: Medicare Other | Admitting: Internal Medicine

## 2017-12-11 ENCOUNTER — Encounter: Payer: Self-pay | Admitting: Internal Medicine

## 2017-12-11 VITALS — BP 124/68 | HR 78 | Temp 97.6°F | Ht 60.5 in | Wt 128.0 lb

## 2017-12-11 DIAGNOSIS — R3915 Urgency of urination: Secondary | ICD-10-CM

## 2017-12-11 DIAGNOSIS — Z8673 Personal history of transient ischemic attack (TIA), and cerebral infarction without residual deficits: Secondary | ICD-10-CM

## 2017-12-11 DIAGNOSIS — R05 Cough: Secondary | ICD-10-CM

## 2017-12-11 DIAGNOSIS — Z0000 Encounter for general adult medical examination without abnormal findings: Secondary | ICD-10-CM | POA: Diagnosis not present

## 2017-12-11 DIAGNOSIS — R0982 Postnasal drip: Secondary | ICD-10-CM

## 2017-12-11 DIAGNOSIS — E559 Vitamin D deficiency, unspecified: Secondary | ICD-10-CM

## 2017-12-11 DIAGNOSIS — K219 Gastro-esophageal reflux disease without esophagitis: Secondary | ICD-10-CM

## 2017-12-11 DIAGNOSIS — R059 Cough, unspecified: Secondary | ICD-10-CM

## 2017-12-11 DIAGNOSIS — E782 Mixed hyperlipidemia: Secondary | ICD-10-CM

## 2017-12-11 LAB — POC URINALSYSI DIPSTICK (AUTOMATED)
Bilirubin, UA: NEGATIVE
Blood, UA: NEGATIVE
GLUCOSE UA: NEGATIVE
Ketones, UA: NEGATIVE
Leukocytes, UA: NEGATIVE
NITRITE UA: NEGATIVE
PROTEIN UA: NEGATIVE
Spec Grav, UA: 1.015 (ref 1.010–1.025)
UROBILINOGEN UA: 0.2 U/dL
pH, UA: 6 (ref 5.0–8.0)

## 2017-12-11 LAB — COMPREHENSIVE METABOLIC PANEL
ALK PHOS: 88 U/L (ref 39–117)
ALT: 17 U/L (ref 0–35)
AST: 20 U/L (ref 0–37)
Albumin: 3.8 g/dL (ref 3.5–5.2)
BILIRUBIN TOTAL: 0.4 mg/dL (ref 0.2–1.2)
BUN: 19 mg/dL (ref 6–23)
CALCIUM: 9.4 mg/dL (ref 8.4–10.5)
CO2: 28 mEq/L (ref 19–32)
CREATININE: 0.82 mg/dL (ref 0.40–1.20)
Chloride: 102 mEq/L (ref 96–112)
GFR: 70.47 mL/min (ref >60.00)
Glucose, Bld: 120 mg/dL — ABNORMAL HIGH (ref 70–99)
Potassium: 3.7 mEq/L (ref 3.5–5.1)
Sodium: 137 mEq/L (ref 135–145)
TOTAL PROTEIN: 7.8 g/dL (ref 6.0–8.3)

## 2017-12-11 LAB — LIPID PANEL
Cholesterol: 154 mg/dL (ref 0–200)
HDL: 52.8 mg/dL (ref >39.00)
LDL Cholesterol: 86 mg/dL (ref 0–99)
NonHDL: 101.48
TRIGLYCERIDES: 79 mg/dL (ref 0.0–149.0)
Total CHOL/HDL Ratio: 3
VLDL: 15.8 mg/dL (ref 0.0–40.0)

## 2017-12-11 LAB — CBC
HCT: 40.7 % (ref 36.0–46.0)
HEMOGLOBIN: 13.4 g/dL (ref 12.0–15.0)
MCHC: 32.9 g/dL (ref 30.0–36.0)
MCV: 91.6 fl (ref 78.0–100.0)
PLATELETS: 214 10*3/uL (ref 150.0–400.0)
RBC: 4.45 Mil/uL (ref 3.87–5.11)
RDW: 14.3 % (ref 11.5–15.5)
WBC: 8.2 10*3/uL (ref 4.0–10.5)

## 2017-12-11 LAB — VITAMIN D 25 HYDROXY (VIT D DEFICIENCY, FRACTURES): VITD: 88.27 ng/mL (ref 30.00–100.00)

## 2017-12-11 MED ORDER — FERROUS SULFATE 325 (65 FE) MG PO TABS: 650.0000 mg | ORAL_TABLET | Freq: Every day | ORAL | 3 refills | Status: DC

## 2017-12-11 MED ORDER — ATORVASTATIN CALCIUM 20 MG PO TABS: 20.0000 mg | ORAL_TABLET | Freq: Every day | ORAL | 3 refills | Status: DC

## 2017-12-11 MED ORDER — BENZONATATE 200 MG PO CAPS: 200.0000 mg | ORAL_CAPSULE | Freq: Three times a day (TID) | ORAL | 0 refills | Status: DC | PRN

## 2017-12-11 NOTE — Progress Notes (Signed)
HPI:  Pt presents to the clinic today for her Medicare Wellness Exam.   HLD with Hx of Stroke: Her last LDL was 71, triglycerides 191. She is taking Atorvastatin, ASA and Fish Oil as prescribed. She tries to consume a low fat diet.  Past Medical History:  Diagnosis Date  . Anemia   . Glaucoma   . History of blood transfusion   . Hyperlipidemia   . Stroke [facility])     Current Outpatient Medications  Medication Sig Dispense Refill  . aspirin 81 MG tablet Take 81 mg by mouth daily.    Marland Kitchen atorvastatin (LIPITOR) 20 MG tablet TAKE ONE TABLET BY MOUTH EVERY DAY 30 tablet 0  . Calcium Carbonate-Vitamin D (CALCIUM 500 + D) 500-125 MG-UNIT TABS Take 1 tablet by mouth daily.    . cholecalciferol (VITAMIN D) 1000 units tablet Take 1,000 Units by mouth daily.    . ciprofloxacin (CIPRO) 500 MG tablet Take 1 tablet (500 mg total) by mouth 2 (two) times daily. 14 tablet 0  . ferrous sulfate (FEROSUL) 325 (65 FE) MG tablet Take 2 tablets (650 mg total) by mouth daily with breakfast. MUST SCHEDULE ANNUAL PHYSICAL 180 tablet 0  . Flaxseed, Linseed, (FLAX SEED OIL) 1300 MG CAPS Take 1,100 mg by mouth daily.     . Multiple Vitamin (MULTIVITAMIN) tablet Take 1 tablet by mouth daily.    . Omega-3 Fatty Acids (FISH OIL) 1200 MG CAPS Take 2,400 mg by mouth 2 (two) times daily.     No current facility-administered medications for this visit.     Allergies  Allergen Reactions  . Bee Venom Anaphylaxis and Hives  . Codeine Other (See Comments)    Altered mental status Altered mental status  . Morphine And Related     Altered mental status  . Shellfish Allergy Anaphylaxis and Hives    Mainly shrimp  . Macrobid [Nitrofurantoin] Rash    Family History  Problem Relation Age of Onset  . Colon cancer Brother   . Colon cancer Maternal Uncle   . Colon cancer Maternal Grandmother     Social History   Socioeconomic History  . Marital status: Married    Spouse name: Not on file  . Number of children: Not  on file  . Years of education: Not on file  . Highest education level: Not on file  Occupational History  . Not on file  Social Needs  . Financial resource strain: Not on file  . Food insecurity:    Worry: Not on file    Inability: Not on file  . Transportation needs:    Medical: Not on file    Non-medical: Not on file  Tobacco Use  . Smoking status: Never Smoker  . Smokeless tobacco: Never Used  Substance and Sexual Activity  . Alcohol use: No    Alcohol/week: 0.0 standard drinks  . Drug use: No  . Sexual activity: Never  Lifestyle  . Physical activity:    Days per week: Not on file    Minutes per session: Not on file  . Stress: Not on file  Relationships  . Social connections:    Talks on phone: Not on file    Gets together: Not on file    Attends religious service: Not on file    Active member of club or organization: Not on file    Attends meetings of clubs or organizations: Not on file    Relationship status: Not on file  . Intimate partner violence:  Fear of current or ex partner: Not on file    Emotionally abused: Not on file    Physically abused: Not on file    Forced sexual activity: Not on file  Other Topics Concern  . Not on file  Social History Narrative  . Not on file    Hospitiliaztions: None in the last 6 months  Health Maintenance:    Flu: 09/2017  Tetanus: 05/2010  Pneumovax: had it, can't remember the year  Prevnar: 10/2014  Zostavax: had it, can't remember the year  Shingrix: 09/2017  Mammogram: 04/2014  Pap Smear: 12/2013  Bone Density: 04/2014  Colon Screening: 04/2012  Eye Doctor: 08/2017  Dental Exam: dentures   Providers:   PCP: Webb Silversmith, NP-C  Ophthalmologist: Dr. Ellin Mayhew   I have personally reviewed and have noted:  1. The patient's medical and social history 2. Their use of alcohol, tobacco or illicit drugs 3. Their current medications and supplements 4. The patient's functional ability including ADL's, fall risks,  home safety risks and hearing or visual impairment. 5. Diet and physical activities 6. Evidence for depression or mood disorder  Subjective:   Review of Systems:   Constitutional: Denies fever, malaise, fatigue, headache or abrupt weight changes.  HEENT: Denies eye pain, eye redness, ear pain, ringing in the ears, wax buildup, runny nose, nasal congestion, bloody nose, or sore throat. Respiratory: Pt reports cough. Denies difficulty breathing, shortness of breath, or sputum production.   Cardiovascular: Denies chest pain, chest tightness, palpitations or swelling in the hands or feet.  Gastrointestinal: Pt reports intermittent reflux. Denies abdominal pain, bloating, constipation, diarrhea or blood in the stool.  GU: Pt reports urinary urgency. Denies frequency, pain with urination, burning sensation, blood in urine, odor or discharge. Musculoskeletal: Denies decrease in range of motion, difficulty with gait, muscle pain or joint pain and swelling.  Skin: Denies redness, rashes, lesions or ulcercations.  Neurological: Denies dizziness, difficulty with memory, difficulty with speech or problems with balance and coordination.  Psych: Denies anxiety, depression, SI/HI.  No other specific complaints in a complete review of systems (except as listed in HPI above).  Objective:  PE:  BP 124/68   Pulse 78   Temp 97.6 F (36.4 C) (Oral)   Ht 5' 0.5" (1.537 m)   Wt 128 lb (58.1 kg)   SpO2 97%   BMI 24.59 kg/m   Wt Readings from Last 3 Encounters:  05/26/17 131 lb (59.4 kg)  05/14/17 131 lb (59.4 kg)  05/11/17 127 lb (57.6 kg)    General: Appears her stated age,  in NAD. Skin: Warm, dry and intact.  HEENT: Head: normal shape and size; Eyes: sclera white, no icterus, conjunctiva pink, PERRLA and EOMs intact; Ears: Tm's gray and intact, normal light reflex; Throat/Mouth: Teeth present, mucosa pink and moist, + PND, no exudate, lesions or ulcerations noted.  Neck: Neck supple, trachea  midline. No masses, lumps or thyromegaly present.  Cardiovascular: Normal rate and rhythm. S1,S2 noted.  No murmur, rubs or gallops noted. No JVD or BLE edema. No carotid bruits noted. Pulmonary/Chest: Normal effort and positive vesicular breath sounds. No respiratory distress. No wheezes, rales or ronchi noted.  Abdomen: Soft and nontender. Normal bowel sounds. No distention or masses noted. Liver, spleen and kidneys non palpable. No CVA tenderness noted. Musculoskeletal: Strength 5/5 BUE/BLE. No signs of joint swelling.  Neurological: Alert and oriented. Cranial nerves II-XII grossly intact. Coordination normal.  Psychiatric: Mood and affect normal. Behavior is normal. Judgment and thought  content normal.   BMET    Component Value Date/Time   NA 137 05/11/2017 1137   K 3.9 05/11/2017 1137   CL 106 05/11/2017 1137   CO2 23 05/11/2017 1137   GLUCOSE 115 (H) 05/11/2017 1137   BUN 22 (H) 05/11/2017 1137   CREATININE 0.79 05/11/2017 1137   CALCIUM 8.9 05/11/2017 1137   GFRNONAA >60 05/11/2017 1137   GFRAA >60 05/11/2017 1137    Lipid Panel     Component Value Date/Time   CHOL 169 09/25/2016 1530   TRIG 191.0 (H) 09/25/2016 1530   HDL 60.20 09/25/2016 1530   CHOLHDL 3 09/25/2016 1530   VLDL 38.2 09/25/2016 1530   LDLCALC 71 09/25/2016 1530    CBC    Component Value Date/Time   WBC 17.0 (H) 05/11/2017 1137   RBC 4.40 05/11/2017 1137   HGB 13.4 05/11/2017 1137   HCT 40.2 05/11/2017 1137   PLT 199 05/11/2017 1137   MCV 91.4 05/11/2017 1137   MCH 30.5 05/11/2017 1137   MCHC 33.4 05/11/2017 1137   RDW 13.6 05/11/2017 1137   LYMPHSABS 0.8 (L) 05/11/2017 1137   MONOABS 1.0 (H) 05/11/2017 1137   EOSABS 0.0 05/11/2017 1137   BASOSABS 0.0 05/11/2017 1137    Hgb A1C Lab Results  Component Value Date   HGBA1C 5.6 02/08/2015      Assessment and Plan:   Medicare Annual Wellness Visit:  Diet: She does eat meat. She consumes fruits and veggies daily. She tries to avoid  fried foods. She drinks mostly coffee and water. Physical activity: Walks 3 miles daily Depression/mood screen: Negative Hearing: Intact to whispered voice Visual acuity: Grossly normal, performs annual eye exam  ADLs: Capable Fall risk: None Home safety: Good Cognitive evaluation: Intact to orientation, naming, recall and repetition EOL planning: Adv directives, full code/ I agree  Preventative Medicine: Flu, tetanus, pneumovax, prevnar, zostovax and shingrix are UTD. She no longer needs pap smears, mammogram, or colon cancer screening. She declines bone density at this time. Encouraged her to consume a balanced diet and exercise regimen. Advised her to see an eye doctor and dentist annually. Will check CBC, CMET, Lipid and Vit D today.  Urinary Urgency:  Urinalysis: normal Will send urine culture Avoid caffeine Push fluids  GERD:  Intermittent Can take Tums if needed Advised her to let me know if persistent or worse  Cough secondary to PND:  Try Allegra OTC RX for Tessalon 200 mg TID prn  Next appointment: 1 year, Medicare Wellness Exam   Webb Silversmith, NP

## 2017-12-11 NOTE — Assessment & Plan Note (Signed)
Will check CBC, CMET and Lipid profile today Encouraged her to consume a low fat diet Atorvastatin refilled Continue ASA and Fish Oil

## 2017-12-11 NOTE — Patient Instructions (Signed)
Health Maintenance for Postmenopausal Women Menopause is a normal process in which your reproductive ability comes to an end. This process happens gradually over a span of months to years, usually between the ages of 22 and 9. Menopause is complete when you have missed 12 consecutive menstrual periods. It is important to talk with your health care provider about some of the most common conditions that affect postmenopausal women, such as heart disease, cancer, and bone loss (osteoporosis). Adopting a healthy lifestyle and getting preventive care can help to promote your health and wellness. Those actions can also lower your chances of developing some of these common conditions. What should I know about menopause? During menopause, you may experience a number of symptoms, such as:  Moderate-to-severe hot flashes.  Night sweats.  Decrease in sex drive.  Mood swings.  Headaches.  Tiredness.  Irritability.  Memory problems.  Insomnia.  Choosing to treat or not to treat menopausal changes is an individual decision that you make with your health care provider. What should I know about hormone replacement therapy and supplements? Hormone therapy products are effective for treating symptoms that are associated with menopause, such as hot flashes and night sweats. Hormone replacement carries certain risks, especially as you become older. If you are thinking about using estrogen or estrogen with progestin treatments, discuss the benefits and risks with your health care provider. What should I know about heart disease and stroke? Heart disease, heart attack, and stroke become more likely as you age. This may be due, in part, to the hormonal changes that your body experiences during menopause. These can affect how your body processes dietary fats, triglycerides, and cholesterol. Heart attack and stroke are both medical emergencies. There are many things that you can do to help prevent heart disease  and stroke:  Have your blood pressure checked at least every 1-2 years. High blood pressure causes heart disease and increases the risk of stroke.  If you are 53-22 years old, ask your health care provider if you should take aspirin to prevent a heart attack or a stroke.  Do not use any tobacco products, including cigarettes, chewing tobacco, or electronic cigarettes. If you need help quitting, ask your health care provider.  It is important to eat a healthy diet and maintain a healthy weight. ? Be sure to include plenty of vegetables, fruits, low-fat dairy products, and lean protein. ? Avoid eating foods that are high in solid fats, added sugars, or salt (sodium).  Get regular exercise. This is one of the most important things that you can do for your health. ? Try to exercise for at least 150 minutes each week. The type of exercise that you do should increase your heart rate and make you sweat. This is known as moderate-intensity exercise. ? Try to do strengthening exercises at least twice each week. Do these in addition to the moderate-intensity exercise.  Know your numbers.Ask your health care provider to check your cholesterol and your blood glucose. Continue to have your blood tested as directed by your health care provider.  What should I know about cancer screening? There are several types of cancer. Take the following steps to reduce your risk and to catch any cancer development as early as possible. Breast Cancer  Practice breast self-awareness. ? This means understanding how your breasts normally appear and feel. ? It also means doing regular breast self-exams. Let your health care provider know about any changes, no matter how small.  If you are 40  or older, have a clinician do a breast exam (clinical breast exam or CBE) every year. Depending on your age, family history, and medical history, it may be recommended that you also have a yearly breast X-ray (mammogram).  If you  have a family history of breast cancer, talk with your health care provider about genetic screening.  If you are at high risk for breast cancer, talk with your health care provider about having an MRI and a mammogram every year.  Breast cancer (BRCA) gene test is recommended for women who have family members with BRCA-related cancers. Results of the assessment will determine the need for genetic counseling and BRCA1 and for BRCA2 testing. BRCA-related cancers include these types: ? Breast. This occurs in males or females. ? Ovarian. ? Tubal. This may also be called fallopian tube cancer. ? Cancer of the abdominal or pelvic lining (peritoneal cancer). ? Prostate. ? Pancreatic.  Cervical, Uterine, and Ovarian Cancer Your health care provider may recommend that you be screened regularly for cancer of the pelvic organs. These include your ovaries, uterus, and vagina. This screening involves a pelvic exam, which includes checking for microscopic changes to the surface of your cervix (Pap test).  For women ages 21-65, health care providers may recommend a pelvic exam and a Pap test every three years. For women ages 79-65, they may recommend the Pap test and pelvic exam, combined with testing for human papilloma virus (HPV), every five years. Some types of HPV increase your risk of cervical cancer. Testing for HPV may also be done on women of any age who have unclear Pap test results.  Other health care providers may not recommend any screening for nonpregnant women who are considered low risk for pelvic cancer and have no symptoms. Ask your health care provider if a screening pelvic exam is right for you.  If you have had past treatment for cervical cancer or a condition that could lead to cancer, you need Pap tests and screening for cancer for at least 20 years after your treatment. If Pap tests have been discontinued for you, your risk factors (such as having a new sexual partner) need to be  reassessed to determine if you should start having screenings again. Some women have medical problems that increase the chance of getting cervical cancer. In these cases, your health care provider may recommend that you have screening and Pap tests more often.  If you have a family history of uterine cancer or ovarian cancer, talk with your health care provider about genetic screening.  If you have vaginal bleeding after reaching menopause, tell your health care provider.  There are currently no reliable tests available to screen for ovarian cancer.  Lung Cancer Lung cancer screening is recommended for adults 69-62 years old who are at high risk for lung cancer because of a history of smoking. A yearly low-dose CT scan of the lungs is recommended if you:  Currently smoke.  Have a history of at least 30 pack-years of smoking and you currently smoke or have quit within the past 15 years. A pack-year is smoking an average of one pack of cigarettes per day for one year.  Yearly screening should:  Continue until it has been 15 years since you quit.  Stop if you develop a health problem that would prevent you from having lung cancer treatment.  Colorectal Cancer  This type of cancer can be detected and can often be prevented.  Routine colorectal cancer screening usually begins at  age 42 and continues through age 45.  If you have risk factors for colon cancer, your health care provider may recommend that you be screened at an earlier age.  If you have a family history of colorectal cancer, talk with your health care provider about genetic screening.  Your health care provider may also recommend using home test kits to check for hidden blood in your stool.  A small camera at the end of a tube can be used to examine your colon directly (sigmoidoscopy or colonoscopy). This is done to check for the earliest forms of colorectal cancer.  Direct examination of the colon should be repeated every  5-10 years until age 71. However, if early forms of precancerous polyps or small growths are found or if you have a family history or genetic risk for colorectal cancer, you may need to be screened more often.  Skin Cancer  Check your skin from head to toe regularly.  Monitor any moles. Be sure to tell your health care provider: ? About any new moles or changes in moles, especially if there is a change in a mole's shape or color. ? If you have a mole that is larger than the size of a pencil eraser.  If any of your family members has a history of skin cancer, especially at a young age, talk with your health care provider about genetic screening.  Always use sunscreen. Apply sunscreen liberally and repeatedly throughout the day.  Whenever you are outside, protect yourself by wearing long sleeves, pants, a wide-brimmed hat, and sunglasses.  What should I know about osteoporosis? Osteoporosis is a condition in which bone destruction happens more quickly than new bone creation. After menopause, you may be at an increased risk for osteoporosis. To help prevent osteoporosis or the bone fractures that can happen because of osteoporosis, the following is recommended:  If you are 46-71 years old, get at least 1,000 mg of calcium and at least 600 mg of vitamin D per day.  If you are older than age 55 but younger than age 65, get at least 1,200 mg of calcium and at least 600 mg of vitamin D per day.  If you are older than age 54, get at least 1,200 mg of calcium and at least 800 mg of vitamin D per day.  Smoking and excessive alcohol intake increase the risk of osteoporosis. Eat foods that are rich in calcium and vitamin D, and do weight-bearing exercises several times each week as directed by your health care provider. What should I know about how menopause affects my mental health? Depression may occur at any age, but it is more common as you become older. Common symptoms of depression  include:  Low or sad mood.  Changes in sleep patterns.  Changes in appetite or eating patterns.  Feeling an overall lack of motivation or enjoyment of activities that you previously enjoyed.  Frequent crying spells.  Talk with your health care provider if you think that you are experiencing depression. What should I know about immunizations? It is important that you get and maintain your immunizations. These include:  Tetanus, diphtheria, and pertussis (Tdap) booster vaccine.  Influenza every year before the flu season begins.  Pneumonia vaccine.  Shingles vaccine.  Your health care provider may also recommend other immunizations. This information is not intended to replace advice given to you by your health care provider. Make sure you discuss any questions you have with your health care provider. Document Released: 02/16/2005  Document Revised: 07/15/2015 Document Reviewed: 09/28/2014 Elsevier Interactive Patient Education  2018 [employer]  

## 2017-12-11 NOTE — Assessment & Plan Note (Signed)
No residual effect Continue Atorvastatin, Fish Oil and ASA

## 2017-12-11 NOTE — Addendum Note (Signed)
Addended by: Lurlean Nanny on: 12/11/2017 03:16 PM   Modules accepted: Orders

## 2017-12-13 ENCOUNTER — Other Ambulatory Visit: Payer: Self-pay | Admitting: Internal Medicine

## 2017-12-13 LAB — URINE CULTURE
MICRO NUMBER:: 91452274
SPECIMEN QUALITY:: ADEQUATE

## 2017-12-13 MED ORDER — VANCOMYCIN HCL 250 MG PO CAPS: 250.0000 mg | ORAL_CAPSULE | Freq: Two times a day (BID) | ORAL | 0 refills | Status: DC

## 2017-12-24 ENCOUNTER — Ambulatory Visit (INDEPENDENT_AMBULATORY_CARE_PROVIDER_SITE_OTHER): Payer: Medicare Other | Admitting: Internal Medicine

## 2017-12-24 ENCOUNTER — Encounter: Payer: Self-pay | Admitting: Internal Medicine

## 2017-12-24 VITALS — BP 126/74 | HR 78 | Temp 97.7°F | Wt 132.0 lb

## 2017-12-24 DIAGNOSIS — N390 Urinary tract infection, site not specified: Secondary | ICD-10-CM | POA: Diagnosis not present

## 2017-12-24 DIAGNOSIS — R3915 Urgency of urination: Secondary | ICD-10-CM

## 2017-12-24 LAB — POC URINALSYSI DIPSTICK (AUTOMATED)
BILIRUBIN UA: NEGATIVE
Glucose, UA: NEGATIVE
Ketones, UA: NEGATIVE
Leukocytes, UA: NEGATIVE
Nitrite, UA: NEGATIVE
Protein, UA: NEGATIVE
RBC UA: NEGATIVE
SPEC GRAV UA: 1.01 (ref 1.010–1.025)
UROBILINOGEN UA: 0.2 U/dL
pH, UA: 6 (ref 5.0–8.0)

## 2017-12-24 MED ORDER — CIPROFLOXACIN HCL 250 MG PO TABS: 250.0000 mg | ORAL_TABLET | Freq: Two times a day (BID) | ORAL | 0 refills | Status: DC

## 2017-12-24 NOTE — Patient Instructions (Signed)

## 2017-12-24 NOTE — Progress Notes (Signed)
HPI  Pt presents to the clinic today with c/o urinary urgency. She reports this started 5 days ago. She denies frequency, dysuria or blood in her urine. She was recently treated for Enterococcus with Vancomycin. She reports symptoms improved while on abx, but restarted shortly thereafter. She denies fever, chills, nausea or low back pain.    Review of Systems  Past Medical History:  Diagnosis Date  . Anemia   . Glaucoma   . History of blood transfusion   . Hyperlipidemia   . Stroke [facility])     Family History  Problem Relation Age of Onset  . Colon cancer Brother   . Colon cancer Maternal Uncle   . Colon cancer Maternal Grandmother     Social History   Socioeconomic History  . Marital status: Married    Spouse name: Not on file  . Number of children: Not on file  . Years of education: Not on file  . Highest education level: Not on file  Occupational History  . Not on file  Social Needs  . Financial resource strain: Not on file  . Food insecurity:    Worry: Not on file    Inability: Not on file  . Transportation needs:    Medical: Not on file    Non-medical: Not on file  Tobacco Use  . Smoking status: Never Smoker  . Smokeless tobacco: Never Used  Substance and Sexual Activity  . Alcohol use: No    Alcohol/week: 0.0 standard drinks  . Drug use: No  . Sexual activity: Never  Lifestyle  . Physical activity:    Days per week: Not on file    Minutes per session: Not on file  . Stress: Not on file  Relationships  . Social connections:    Talks on phone: Not on file    Gets together: Not on file    Attends religious service: Not on file    Active member of club or organization: Not on file    Attends meetings of clubs or organizations: Not on file    Relationship status: Not on file  . Intimate partner violence:    Fear of current or ex partner: Not on file    Emotionally abused: Not on file    Physically abused: Not on file    Forced sexual activity: Not on  file  Other Topics Concern  . Not on file  Social History Narrative  . Not on file    Allergies  Allergen Reactions  . Bee Venom Anaphylaxis and Hives  . Codeine Other (See Comments)    Altered mental status Altered mental status  . Morphine And Related     Altered mental status  . Shellfish Allergy Anaphylaxis and Hives    Mainly shrimp  . Macrobid [Nitrofurantoin] Rash     Constitutional: Denies fever, malaise, fatigue, headache or abrupt weight changes.   GU: Pt reports urgency. Denies frequency, dysuria, burning sensation, blood in urine, odor or discharge.    No other specific complaints in a complete review of systems (except as listed in HPI above).    Objective:   Physical Exam  BP 126/74   Pulse 78   Temp 97.7 F (36.5 C) (Oral)   Wt 132 lb (59.9 kg)   SpO2 97%   BMI 25.36 kg/m   Wt Readings from Last 3 Encounters:  12/11/17 128 lb (58.1 kg)  05/26/17 131 lb (59.4 kg)  05/14/17 131 lb (59.4 kg)    General: Appears  her stated age, well developed, well nourished in NAD. Abdomen: Soft. Normal bowel sounds. No distention or masses noted.  Tender to palpation over the bladder area. No CVA tenderness.        Assessment & Plan:   Urinary Urgency due to Recurrent UTI:  Urinalysis: normal Will send urine culture eRx sent if for Cipro 250 mg BID x 5 days OK to take AZO OTC Drink plenty of fluids  RTC as needed or if symptoms persist. Webb Silversmith, NP

## 2017-12-26 LAB — URINE CULTURE
MICRO NUMBER:: 91508733
SPECIMEN QUALITY:: ADEQUATE

## 2018-02-03 ENCOUNTER — Ambulatory Visit (INDEPENDENT_AMBULATORY_CARE_PROVIDER_SITE_OTHER): Payer: Medicare Other | Admitting: Podiatry

## 2018-02-03 ENCOUNTER — Encounter: Payer: Self-pay | Admitting: Podiatry

## 2018-02-03 DIAGNOSIS — B351 Tinea unguium: Secondary | ICD-10-CM

## 2018-02-03 DIAGNOSIS — M79674 Pain in right toe(s): Secondary | ICD-10-CM

## 2018-02-03 NOTE — Progress Notes (Signed)
Complaint:  Visit Type: Patient returns to my office for continued preventative foot care services. Complaint: Patient states" my nails have grown long and thick and become painful to walk and wear shoes"  The patient presents for preventative foot care services. No changes to ROS  Podiatric Exam: Vascular: dorsalis pedis and posterior tibial pulses are palpable bilateral. Capillary return is immediate. Temperature gradient is WNL. Skin turgor WNL  Sensorium: Normal Semmes Weinstein monofilament test. Normal tactile sensation bilaterally. Nail Exam: Pt has thick disfigured discolored nails with subungual debris noted  entire nail hallux right. Ulcer Exam: There is no evidence of ulcer or pre-ulcerative changes or infection. Orthopedic Exam: Muscle tone and strength are WNL. No limitations in general ROM. No crepitus or effusions noted. Foot type and digits show no abnormalities.Painful bunion left foot. Prominence 5th metabase left foot. Skin: No Porokeratosis. No infection or ulcers  Diagnosis:  Onychomycosis, , Pain in right toe, pain in left toes  Treatment & Plan Procedures and Treatment: Consent by patient was obtained for treatment procedures.   Debridement of mycotic and hypertrophic toenails hallux right foot. and clearing of subungual debris. No ulceration, no infection noted.  Return Visit-Office Procedure: Patient instructed to return to the office for a follow up visit 3 months for continued evaluation and treatment.    Gardiner Barefoot DPM

## 2018-02-14 ENCOUNTER — Ambulatory Visit (INDEPENDENT_AMBULATORY_CARE_PROVIDER_SITE_OTHER): Payer: Medicare Other

## 2018-02-14 ENCOUNTER — Encounter: Payer: Self-pay | Admitting: Internal Medicine

## 2018-02-14 ENCOUNTER — Other Ambulatory Visit: Payer: Self-pay | Admitting: Internal Medicine

## 2018-02-14 ENCOUNTER — Ambulatory Visit (INDEPENDENT_AMBULATORY_CARE_PROVIDER_SITE_OTHER): Payer: Medicare Other | Admitting: Internal Medicine

## 2018-02-14 ENCOUNTER — Telehealth: Payer: Self-pay | Admitting: Internal Medicine

## 2018-02-14 ENCOUNTER — Ambulatory Visit: Payer: Self-pay | Admitting: *Deleted

## 2018-02-14 VITALS — BP 126/86 | HR 86 | Temp 97.4°F | Ht 60.5 in | Wt 127.8 lb

## 2018-02-14 DIAGNOSIS — R0602 Shortness of breath: Secondary | ICD-10-CM

## 2018-02-14 LAB — CBC WITH DIFFERENTIAL/PLATELET
BASOS ABS: 0.1 10*3/uL (ref 0.0–0.1)
Basophils Relative: 0.8 % (ref 0.0–3.0)
Eosinophils Absolute: 0.5 10*3/uL (ref 0.0–0.7)
Eosinophils Relative: 6.9 % — ABNORMAL HIGH (ref 0.0–5.0)
HEMATOCRIT: 40.2 % (ref 36.0–46.0)
Hemoglobin: 13.2 g/dL (ref 12.0–15.0)
LYMPHS ABS: 1.6 10*3/uL (ref 0.7–4.0)
LYMPHS PCT: 22.7 % (ref 12.0–46.0)
MCHC: 32.9 g/dL (ref 30.0–36.0)
MCV: 91.3 fl (ref 78.0–100.0)
MONOS PCT: 10.4 % (ref 3.0–12.0)
Monocytes Absolute: 0.7 10*3/uL (ref 0.1–1.0)
NEUTROS PCT: 59.2 % (ref 43.0–77.0)
Neutro Abs: 4.1 10*3/uL (ref 1.4–7.7)
Platelets: 209 10*3/uL (ref 150.0–400.0)
RBC: 4.4 Mil/uL (ref 3.87–5.11)
RDW: 14.9 % (ref 11.5–15.5)
WBC: 7 10*3/uL (ref 4.0–10.5)

## 2018-02-14 LAB — D-DIMER, QUANTITATIVE: D-DIMER: 1.39 mg/L FEU — ABNORMAL HIGH (ref 0.00–0.49)

## 2018-02-14 LAB — TROPONIN I: Troponin I: <0.01 ng/mL (ref 0.00–0.04)

## 2018-02-14 LAB — PRO B NATRIURETIC PEPTIDE: NT-PRO BNP: 110 pg/mL (ref 0–738)

## 2018-02-14 MED ORDER — IPRATROPIUM BROMIDE 0.02 % IN SOLN
0.5000 mg | Freq: Once | RESPIRATORY_TRACT | Status: AC
  Administered 2018-02-14: 0.5 mg via RESPIRATORY_TRACT

## 2018-02-14 MED ORDER — ALBUTEROL SULFATE (2.5 MG/3ML) 0.083% IN NEBU
2.5000 mg | INHALATION_SOLUTION | Freq: Once | RESPIRATORY_TRACT | Status: AC
  Administered 2018-02-14: 2.5 mg via RESPIRATORY_TRACT

## 2018-02-14 MED ORDER — ALBUTEROL SULFATE HFA 108 (90 BASE) MCG/ACT IN AERS: 1.0000 | INHALATION_SPRAY | Freq: Four times a day (QID) | RESPIRATORY_TRACT | 0 refills | Status: DC | PRN

## 2018-02-14 NOTE — Telephone Encounter (Signed)
Thanks ok it is not urgent   [facility]

## 2018-02-14 NOTE — Telephone Encounter (Signed)
The next available Monday for her ECHO is feb 24. They state if she needs this done prior to this date then it will need to be put in as STAT and they will try to work her in. I have not let her know anything regarding this appointment yet. Please let me know how you would like me proceed.  Thanks, [employer]

## 2018-02-14 NOTE — Telephone Encounter (Signed)
Our 1315 today.  She is a [facility] patient.

## 2018-02-14 NOTE — Progress Notes (Signed)
No chief complaint on file.  F/u  1. C/o worsening sob x 3 days no known h/o allergies no wheezing. CXR 05/2017 with chronic bronchitis and ILD. She has h/o large hiatal hernia which caused her to be anemic in 2014. Activity is limited due to sob, no CP. Tried Claritin w/o relief.   Review of Systems  Constitutional: Negative for weight loss.  HENT: Negative for hearing loss.   Eyes: Negative for blurred vision.  Respiratory: Positive for shortness of breath. Negative for wheezing.        With exertion    Cardiovascular: Negative for chest pain.  Skin: Negative for rash.  Psychiatric/Behavioral: Negative for memory loss.   Past Medical History:  Diagnosis Date  . Anemia   . Glaucoma   . History of blood transfusion   . Hyperlipidemia   . Stroke [facility]    Past Surgical History:  Procedure Laterality Date  . ABDOMINAL HYSTERECTOMY  1982   total  . BUNIONECTOMY Bilateral   . RECTOCELE REPAIR    . TONSILLECTOMY     Family History  Problem Relation Age of Onset  . Colon cancer Brother   . Colon cancer Maternal Uncle   . Colon cancer Maternal Grandmother    Social History   Socioeconomic History  . Marital status: Married    Spouse name: Not on file  . Number of children: Not on file  . Years of education: Not on file  . Highest education level: Not on file  Occupational History  . Not on file  Social Needs  . Financial resource strain: Not on file  . Food insecurity:    Worry: Not on file    Inability: Not on file  . Transportation needs:    Medical: Not on file    Non-medical: Not on file  Tobacco Use  . Smoking status: Never Smoker  . Smokeless tobacco: Never Used  Substance and Sexual Activity  . Alcohol use: No    Alcohol/week: 0.0 standard drinks  . Drug use: No  . Sexual activity: Never  Lifestyle  . Physical activity:    Days per week: Not on file    Minutes per session: Not on file  . Stress: Not on file  Relationships  . Social connections:     Talks on phone: Not on file    Gets together: Not on file    Attends religious service: Not on file    Active member of club or organization: Not on file    Attends meetings of clubs or organizations: Not on file    Relationship status: Not on file  . Intimate partner violence:    Fear of current or ex partner: Not on file    Emotionally abused: Not on file    Physically abused: Not on file    Forced sexual activity: Not on file  Other Topics Concern  . Not on file  Social History Narrative  . Not on file   No outpatient medications have been marked as taking for the 02/14/18 encounter (Appointment) with McLean-Scocuzza, Nino Glow, MD.   Allergies  Allergen Reactions  . Bee Venom Anaphylaxis and Hives  . Codeine Other (See Comments)    Altered mental status Altered mental status  . Morphine And Related     Altered mental status  . Shellfish Allergy Anaphylaxis and Hives    Mainly shrimp  . Macrobid [Nitrofurantoin] Rash   Recent Results (from the past 2160 hour(s))  CBC  Status: None   Collection Time: 12/11/17  2:36 PM  Result Value Ref Range   WBC 8.2 4.0 - 10.5 K/uL   RBC 4.45 3.87 - 5.11 Mil/uL   Platelets 214.0 150.0 - 400.0 K/uL   Hemoglobin 13.4 12.0 - 15.0 g/dL   HCT 40.7 36.0 - 46.0 %   MCV 91.6 78.0 - 100.0 fl   MCHC 32.9 30.0 - 36.0 g/dL   RDW 14.3 11.5 - 15.5 %  Comprehensive metabolic panel     Status: Abnormal   Collection Time: 12/11/17  2:36 PM  Result Value Ref Range   Sodium 137 135 - 145 mEq/L   Potassium 3.7 3.5 - 5.1 mEq/L   Chloride 102 96 - 112 mEq/L   CO2 28 19 - 32 mEq/L   Glucose, Bld 120 (H) 70 - 99 mg/dL   BUN 19 6 - 23 mg/dL   Creatinine, Ser 0.82 0.40 - 1.20 mg/dL   Total Bilirubin 0.4 0.2 - 1.2 mg/dL   Alkaline Phosphatase 88 39 - 117 U/L   AST 20 0 - 37 U/L   ALT 17 0 - 35 U/L   Total Protein 7.8 6.0 - 8.3 g/dL   Albumin 3.8 3.5 - 5.2 g/dL   Calcium 9.4 8.4 - 10.5 mg/dL   GFR 70.47 >60.00 mL/min  Lipid panel     Status: None    Collection Time: 12/11/17  2:36 PM  Result Value Ref Range   Cholesterol 154 0 - 200 mg/dL    Comment: ATP III Classification       Desirable:  < 200 mg/dL               Borderline High:  200 - 239 mg/dL          High:  > = 240 mg/dL   Triglycerides 79.0 0.0 - 149.0 mg/dL    Comment: Normal:  <150 mg/dLBorderline High:  150 - 199 mg/dL   HDL 52.80 >39.00 mg/dL   VLDL 15.8 0.0 - 40.0 mg/dL   LDL Cholesterol 86 0 - 99 mg/dL   Total CHOL/HDL Ratio 3     Comment:                Men          Women1/2 Average Risk     3.4          3.3Average Risk          5.0          4.42X Average Risk          9.6          7.13X Average Risk          15.0          11.0                       NonHDL 101.48     Comment: NOTE:  Non-HDL goal should be 30 mg/dL higher than patient's LDL goal (i.e. LDL goal of < 70 mg/dL, would have non-HDL goal of < 100 mg/dL)  VITAMIN D 25 Hydroxy (Vit-D Deficiency, Fractures)     Status: None   Collection Time: 12/11/17  2:36 PM  Result Value Ref Range   VITD 88.27 30.00 - 100.00 ng/mL  POCT Urinalysis Dipstick (Automated)     Status: Normal   Collection Time: 12/11/17  3:15 PM  Result Value Ref Range   Color, UA yellow    Clarity,  UA clear    Glucose, UA Negative Negative   Bilirubin, UA neg    Ketones, UA neg    Spec Grav, UA 1.015 1.010 - 1.025   Blood, UA neg    pH, UA 6.0 5.0 - 8.0   Protein, UA Negative Negative   Urobilinogen, UA 0.2 0.2 or 1.0 E.U./dL   Nitrite, UA neg    Leukocytes, UA Negative Negative  Urine Culture     Status: Abnormal   Collection Time: 12/11/17  3:28 PM  Result Value Ref Range   MICRO NUMBER: [id number]    SPECIMEN QUALITY: Adequate    Sample Source NOT GIVEN    STATUS: FINAL    ISOLATE 1: Enterococcus faecalis (A)     Comment: Greater than 100,000 CFU/mL of Enterococcus faecalis      Susceptibility   Enterococcus faecalis - URINE CULTURE POSITIVE 1    AMPICILLIN <=2 Sensitive     VANCOMYCIN 2 Sensitive     NITROFURANTOIN* 32  Sensitive      * Legend:S = Susceptible  I = IntermediateR = Resistant  NS = Not susceptible* = Not tested  NR = Not reported**NN = See antimicrobic comments  Urine Culture     Status: None   Collection Time: 12/24/17 11:32 AM  Result Value Ref Range   MICRO NUMBER: [id number]    SPECIMEN QUALITY: Adequate    Sample Source URINE    STATUS: FINAL    Result:      Upon further incubation: Multiple organisms present, each less than 10,000 CFU/mL. These organisms, commonly found on external and internal genitalia, are considered to be colonizers. No further testing performed.  POCT Urinalysis Dipstick (Automated)     Status: Normal   Collection Time: 12/24/17 11:33 AM  Result Value Ref Range   Color, UA pale    Clarity, UA clear    Glucose, UA Negative Negative   Bilirubin, UA neg    Ketones, UA neg    Spec Grav, UA 1.010 1.010 - 1.025   Blood, UA neg    pH, UA 6.0 5.0 - 8.0   Protein, UA Negative Negative   Urobilinogen, UA 0.2 0.2 or 1.0 E.U./dL   Nitrite, UA neg    Leukocytes, UA Negative Negative   Objective  There is no height or weight on file to calculate BMI. Wt Readings from Last 3 Encounters:  12/24/17 132 lb (59.9 kg)  12/11/17 128 lb (58.1 kg)  05/26/17 131 lb (59.4 kg)   Temp Readings from Last 3 Encounters:  12/24/17 97.7 F (36.5 C) (Oral)  12/11/17 97.6 F (36.4 C) (Oral)  05/26/17 97.6 F (36.4 C) (Oral)   BP Readings from Last 3 Encounters:  12/24/17 126/74  12/11/17 124/68  05/26/17 (!) 136/91   Pulse Readings from Last 3 Encounters:  12/24/17 78  12/11/17 78  05/26/17 87    Physical Exam Vitals signs and nursing note reviewed.  Constitutional:      Appearance: Normal appearance. She is well-developed, well-groomed and normal weight.  HENT:     Head: Normocephalic and atraumatic.     Mouth/Throat:     Mouth: Mucous membranes are moist.     Pharynx: Oropharynx is clear.  Eyes:     Pupils: Pupils are equal, round, and reactive to light.   Cardiovascular:     Rate and Rhythm: Normal rate and regular rhythm.     Heart sounds: Normal heart sounds.  Pulmonary:     Effort: Pulmonary effort is  normal.     Breath sounds: Normal breath sounds. No decreased breath sounds, wheezing, rhonchi or rales.  Skin:    General: Skin is warm and dry.  Neurological:     General: No focal deficit present.     Mental Status: She is alert and oriented to person, place, and time.     Gait: Gait normal.  Psychiatric:        Attention and Perception: Attention and perception normal.        Mood and Affect: Mood and affect normal.        Speech: Speech normal.        Behavior: Behavior normal. Behavior is cooperative.        Thought Content: Thought content normal.        Cognition and Memory: Cognition and memory normal.        Judgment: Judgment normal.     Assessment   1. SOB with exertion )2 90% today sob worsening over last 3 days ddx 2/2 large hiatal hernia, r/o cardiac vs lung has ILD and chronic lung changes noted on CXR today  Plan   1. cxr today labs cbc, trop, d dimer probnp  Ordered echo to look at heart  Consider CT chest to look better at lungs in future  ekg today NSR F/u with PCP Tues.  duoneb today x 1 if helps consider albuterol inhaler   Consider GI for h/o large hiatal hernia in future if symptomatic.   Provider: Dr. Olivia Mackie McLean-Scocuzza-Internal Medicine

## 2018-02-14 NOTE — Progress Notes (Signed)
Pre visit review using our clinic review tool, if applicable. No additional management support is needed unless otherwise documented below in the visit note. 

## 2018-02-14 NOTE — Telephone Encounter (Signed)
Patient is calling to report she has ahd pressure on diaphragm causing SOB with exertion exercise. Patient states she has been diagnosed with hiatal hernia in the past and she uses iron for it. Patient states she has noticed a change over the last 3-4 days. Patient reports no other symptoms. Call to PCP office- they do not have opening - patient scheduled at [facility] Reason for Disposition . [1] MILD difficulty breathing (e.g., minimal/no SOB at rest, SOB with walking, pulse <100) AND [2] NEW-onset or WORSE than normal  Answer Assessment - Initial Assessment Questions 1. RESPIRATORY STATUS: "Describe your breathing?" (e.g., wheezing, shortness of breath, unable to speak, severe coughing)      gets out of breath due to hiatal hernia 2. ONSET: "When did this breathing problem begin?"      3-4 days- patient thought it would get better- but it has not 3. PATTERN "Does the difficult breathing come and go, or has it been constant since it started?"      Comes and goes with exercise- causes cough 4. SEVERITY: "How bad is your breathing?" (e.g., mild, moderate, severe)    - MILD: No SOB at rest, mild SOB with walking, speaks normally in sentences, can lay down, no retractions, pulse < 100.    - MODERATE: SOB at rest, SOB with minimal exertion and prefers to sit, cannot lie down flat, speaks in phrases, mild retractions, audible wheezing, pulse 100-120.    - SEVERE: Very SOB at rest, speaks in single words, struggling to breathe, sitting hunched forward, retractions, pulse > 120      Pressure on diaphragm from hernia, mild 5. RECURRENT SYMPTOM: "Have you had difficulty breathing before?" If so, ask: "When was the last time?" and "What happened that time?"      Iron- yes- when diagnosed 2014 6. CARDIAC HISTORY: "Do you have any history of heart disease?" (e.g., heart attack, angina, bypass surgery, angioplasty)      no 7. LUNG HISTORY: "Do you have any history of lung disease?"  (e.g.,  pulmonary embolus, asthma, emphysema)     no 8. CAUSE: "What do you think is causing the breathing problem?"      Hernia pressure 9. OTHER SYMPTOMS: "Do you have any other symptoms? (e.g., dizziness, runny nose, cough, chest pain, fever)     No- sinus from cold wheather 10. PREGNANCY: "Is there any chance you are pregnant?" "When was your last menstrual period?"       n/a 11. TRAVEL: "Have you traveled out of the country in the last month?" (e.g., travel history, exposures)       no  Protocols used: BREATHING DIFFICULTY-A-AH

## 2018-02-14 NOTE — Patient Instructions (Addendum)
Chronic lung changes noted on Chest Xray   Shortness of Breath, Adult Shortness of breath is when a person has trouble breathing enough air or when a person feels like she or he is having trouble breathing in enough air. Shortness of breath could be a sign of a medical problem. Follow these instructions at home:   Pay attention to any changes in your symptoms.  Do not use any products that contain nicotine or tobacco, such as cigarettes, e-cigarettes, and chewing tobacco.  Do not smoke. Smoking is a common cause of shortness of breath. If you need help quitting, ask your health care provider.  Avoid things that can irritate your airways, such as: ? Mold. ? Dust. ? Air pollution. ? Chemical fumes. ? Things that can cause allergy symptoms (allergens), if you have allergies.  Keep your living space clean and free of mold and dust.  Rest as needed. Slowly return to your usual activities.  Take over-the-counter and prescription medicines only as told by your health care provider. This includes oxygen therapy and inhaled medicines.  Keep all follow-up visits as told by your health care provider. This is important. Contact a health care provider if:  Your condition does not improve as soon as expected.  You have a hard time doing your normal activities, even after you rest.  You have new symptoms. Get help right away if:  Your shortness of breath gets worse.  You have shortness of breath when you are resting.  You feel light-headed or you faint.  You have a cough that is not controlled with medicines.  You cough up blood.  You have pain with breathing.  You have pain in your chest, arms, shoulders, or abdomen.  You have a fever.  You cannot walk up stairs or exercise the way that you normally do. These symptoms may represent a serious problem that is an emergency. Do not wait to see if the symptoms will go away. Get medical help right away. Call your local emergency  services (911 in the [place] Do not drive yourself to the hospital. Summary  Shortness of breath is when a person has trouble breathing enough air. It can be a sign of a medical problem.  Avoid things that irritate your lungs, such as smoking, pollution, mold, and dust.  Pay attention to changes in your symptoms and contact your health care provider if you have a hard time completing daily activities because of shortness of breath. This information is not intended to replace advice given to you by your health care provider. Make sure you discuss any questions you have with your health care provider. Document Released: 09/19/2000 Document Revised: 05/27/2017 Document Reviewed: 05/27/2017 Elsevier Interactive Patient Education  2019 [employer].

## 2018-02-17 ENCOUNTER — Ambulatory Visit
Admission: RE | Admit: 2018-02-17 | Discharge: 2018-02-17 | Disposition: A | Discharge status: Home / Self Care | Payer: Medicare Other | Source: Ambulatory Visit | Attending: Internal Medicine | Admitting: Internal Medicine

## 2018-02-17 ENCOUNTER — Ambulatory Visit: Payer: Medicare Other

## 2018-02-17 DIAGNOSIS — R0602 Shortness of breath: Secondary | ICD-10-CM | POA: Insufficient documentation

## 2018-02-17 DIAGNOSIS — R918 Other nonspecific abnormal finding of lung field: Secondary | ICD-10-CM | POA: Diagnosis not present

## 2018-02-18 ENCOUNTER — Ambulatory Visit (INDEPENDENT_AMBULATORY_CARE_PROVIDER_SITE_OTHER): Payer: Medicare Other | Admitting: Internal Medicine

## 2018-02-18 ENCOUNTER — Encounter: Payer: Self-pay | Admitting: Internal Medicine

## 2018-02-18 VITALS — BP 124/72 | HR 80 | Temp 97.5°F | Wt 127.0 lb

## 2018-02-18 DIAGNOSIS — R0602 Shortness of breath: Secondary | ICD-10-CM | POA: Diagnosis not present

## 2018-02-18 DIAGNOSIS — R05 Cough: Secondary | ICD-10-CM

## 2018-02-18 DIAGNOSIS — R059 Cough, unspecified: Secondary | ICD-10-CM

## 2018-02-18 DIAGNOSIS — J849 Interstitial pulmonary disease, unspecified: Secondary | ICD-10-CM

## 2018-02-18 MED ORDER — SPACER/AERO-HOLDING CHAMBERS DEVI: 1.0000 | 0 refills | Status: DC | PRN

## 2018-02-18 NOTE — Patient Instructions (Signed)
Pulmonary Fibrosis  Pulmonary fibrosis is a type of lung disease that causes scarring. Over time, the scar tissue builds up in the air sacs of your lungs (alveoli). This makes it hard for you to breathe. Less oxygen can get into your blood. Scarring from pulmonary fibrosis gets worse over time. This damage is permanent and may lead to other serious health problems. What are the causes? There are many different causes of pulmonary fibrosis. Sometimes the cause is not known. This is called idiopathic pulmonary fibrosis. Other causes include:  Exposure to chemicals and substances found in agricultural, farm, construction, or factory work. These include mold, asbestos, silica, metal dusts, and toxic fumes.  Sarcoidosis. In this disease, areas of inflammatory cells (granulomas) form and most often affect the lungs.  Autoimmune diseases. These include diseases such as rheumatoid arthritis, systemic sclerosis, or connective tissue disease.  Taking certain medicines. These include drugs used in radiation therapy or used to treat seizures, heart problems, and some infections. What increases the risk? You are more likely to develop this condition if:  You have a family history of the disease.  You are older. The condition is more common in older adults.  You have a history of smoking.  You have a job that exposes you to certain chemicals.  You have gastroesophageal reflux disease (GERD). What are the signs or symptoms? Symptoms of this condition include:  Difficulty breathing that gets worse with activity.  Shortness of breath (dyspnea).  Dry, hacking cough.  Rapid, shallow breathing during exercise or while at rest.  Bluish skin and lips.  Loss of appetite.  Weakness.  Weight loss and fatigue.  Rounded and enlarged fingertips (clubbing). How is this diagnosed? This condition may be diagnosed based on:  Your symptoms and medical history.  A physical exam. You may also have  tests, including:  A test that involves looking inside your lungs with an instrument (bronchoscopy).  Imaging studies of your lungs and heart.  Tests to measure how well you are breathing (pulmonary function tests).  Blood tests.  Tests to see how well your lungs work while you are walking (pulmonary stress test).  A procedure to remove a lung tissue sample to look at it under a microscope (biopsy). How is this treated? There is no cure for pulmonary fibrosis. Treatment focuses on managing symptoms and preventing scarring from getting worse. This may include:  Medicines, such as: ? Steroids to prevent permanent lung changes. ? Medicines to suppress your body's defense system (immune system). ? Medicines to help with lung function by reducing inflammation or scarring.  Ongoing monitoring with X-rays and lab work.  Oxygen therapy.  Pulmonary rehabilitation.  Surgery. In some cases, a lung transplant is possible. Follow these instructions at home:     Medicines  Take over-the-counter and prescription medicines only as told by your health care provider.  Keep your vaccinations up to date as recommended by your health care provider. General instructions  Do not use any products that contain nicotine or tobacco, such as cigarettes and e-cigarettes. If you need help quitting, ask your health care provider.  Get regular exercise, but do not overexert yourself. Ask your health care provider to suggest some activities that are safe for you to do. ? If you have physical limitations, you may get exercise by walking, using a stationary bike, or doing chair exercises. ? Ask your health care provider about using oxygen while exercising.  If you are exposed to chemicals and substances at work,   make sure that you wear a mask or respirator at all times.  Join a pulmonary rehabilitation program or a support group for people with pulmonary fibrosis.  Eat small meals often so you do not  get too full. Overeating can make breathing trouble worse.  Maintain a healthy weight. Lose weight if you need to.  Do breathing exercises as directed by your health care provider.  Keep all follow-up visits as told by your health care provider. This is important. Contact a health care provider if you:  Have symptoms that do not get better with medicines.  Are not able to be as active as usual.  Have trouble taking a deep breath.  Have a fever or chills.  Have blue lips or skin.  Have clubbing of your fingers. Get help right away if you:  Have a sudden worsening of your symptoms.  Have chest pain.  Cough up mucus that is dark in color.  Have a lot of headaches.  Get very confused or sleepy. Summary  Pulmonary fibrosis is a type of lung disease that causes scar tissue to build up in the air sacs of your lungs (alveoli) over time. Less oxygen can get into your blood. This makes it hard for you to breathe.  Scarring from pulmonary fibrosis gets worse over time. This damage is permanent and may lead to other serious health problems.  You are more likely to develop this condition if you have a family history of the condition or a job that exposes you to certain chemicals.  There is no cure for pulmonary fibrosis. Treatment focuses on managing symptoms and preventing scarring from getting worse. This information is not intended to replace advice given to you by your health care provider. Make sure you discuss any questions you have with your health care provider. Document Released: 03/17/2003 Document Revised: 01/30/2017 Document Reviewed: 01/30/2017 Elsevier Interactive Patient Education  2019 Elsevier Inc.  

## 2018-02-18 NOTE — Progress Notes (Signed)
Subjective:    Patient ID: Wanda Nelson, female    DOB: 1931/10/28, 85 y.o.   MRN: [id number]  HPI  Pt presents to the clinic today with c/o cough and shortness of breath. She reports this has been going on 2-3 months, but worsened last week. The cough is dry and non productive. She only has SOB with coughing or with exertion. She denies runny nose, nasal congestion, ear pain or sore throat. She denies chest pain or pressure. She does not smoke and was not around second hand smoke. 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 She has no family history of lung cancer. She saw Dr. Terese Door for the same 2/7. Chest xray showed ILD. CT chest showed large hiatal hernia and ILD. She was given an Albuterol inhaler and advised to follow up with PCP. D dimer slightly elevated but troponin and other labs unremarkable. She has an echo scheduled in March.  Review of Systems  Past Medical History:  Diagnosis Date  . Anemia   . Glaucoma   . History of blood transfusion   . Hyperlipidemia   . Stroke [facility])     Current Outpatient Medications  Medication Sig Dispense Refill  . albuterol (PROVENTIL HFA;VENTOLIN HFA) 108 (90 Base) MCG/ACT inhaler Inhale 1-2 puffs into the lungs every 6 (six) hours as needed for wheezing or shortness of breath. 1 Inhaler 0  . aspirin 81 MG tablet Take 81 mg by mouth daily.    Marland Kitchen atorvastatin (LIPITOR) 20 MG tablet Take 1 tablet (20 mg total) by mouth daily. 90 tablet 3  . benzonatate (TESSALON) 200 MG capsule Take 1 capsule (200 mg total) by mouth 3 (three) times daily as needed for cough. 30 capsule 0  . Calcium Carbonate-Vitamin D (CALCIUM 500 + D) 500-125 MG-UNIT TABS Take 1 tablet by mouth daily.    . cholecalciferol (VITAMIN D) 1000 units tablet Take 1,000 Units by mouth daily.    Mariane Baumgarten Sodium (STOOL SOFTENER) 100 MG capsule Take 100 mg by mouth daily.    . ferrous sulfate (FEROSUL) 325 (65 FE) MG tablet Take 2 tablets (650 mg total) by mouth daily with breakfast. MUST  SCHEDULE ANNUAL PHYSICAL 180 tablet 3  . Flaxseed, Linseed, (FLAX SEED OIL) 1300 MG CAPS Take 1,100 mg by mouth daily.     . Multiple Vitamin (MULTIVITAMIN) tablet Take 1 tablet by mouth daily.    . Multiple Vitamins-Minerals (MULTIVITAMIN ADULTS PO) Take by mouth.    . Omega-3 Fatty Acids (FISH OIL) 1200 MG CAPS Take 2,400 mg by mouth 2 (two) times daily.    . psyllium (REGULOID) 0.52 g capsule Take 0.52 g by mouth daily.    Marland Kitchen UNABLE TO FIND Take by mouth.    Marland Kitchen UNABLE TO FIND Take by mouth.    Marland Kitchen UNABLE TO FIND Take by mouth.    . Spacer/Aero-Holding Chambers DEVI 1 Device by Does not apply route as needed. 1 each 0   No current facility-administered medications for this visit.     Allergies  Allergen Reactions  . Bee Venom Anaphylaxis and Hives  . Codeine Other (See Comments)    Altered mental status Altered mental status  . Morphine And Related     Altered mental status  . Shellfish Allergy Anaphylaxis and Hives    Mainly shrimp  . Macrobid [Nitrofurantoin] Rash    Family History  Problem Relation Age of Onset  . Colon cancer Brother   . Colon cancer Maternal Uncle   . Colon  cancer Maternal Grandmother     Social History   Socioeconomic History  . Marital status: Married    Spouse name: Not on file  . Number of children: Not on file  . Years of education: Not on file  . Highest education level: Not on file  Occupational History  . Not on file  Social Needs  . Financial resource strain: Not on file  . Food insecurity:    Worry: Not on file    Inability: Not on file  . Transportation needs:    Medical: Not on file    Non-medical: Not on file  Tobacco Use  . Smoking status: Never Smoker  . Smokeless tobacco: Never Used  Substance and Sexual Activity  . Alcohol use: No    Alcohol/week: 0.0 standard drinks  . Drug use: No  . Sexual activity: Never  Lifestyle  . Physical activity:    Days per week: Not on file    Minutes per session: Not on file  . Stress:  Not on file  Relationships  . Social connections:    Talks on phone: Not on file    Gets together: Not on file    Attends religious service: Not on file    Active member of club or organization: Not on file    Attends meetings of clubs or organizations: Not on file    Relationship status: Not on file  . Intimate partner violence:    Fear of current or ex partner: Not on file    Emotionally abused: Not on file    Physically abused: Not on file    Forced sexual activity: Not on file  Other Topics Concern  . Not on file  Social History Narrative   [place]     Constitutional: Denies fever, malaise, fatigue, headache or abrupt weight changes.  HEENT: Denies eye pain, eye redness, ear pain, ringing in the ears, wax buildup, runny nose, nasal congestion, bloody nose, or sore throat. Respiratory: Pt reports cough and shortness of breath. Denies difficulty breathing, or sputum production.   Cardiovascular: Denies chest pain, chest tightness, palpitations or swelling in the hands or feet.  Gastrointestinal: Denies abdominal pain, bloating, constipation, diarrhea or blood in the stool.   No other specific complaints in a complete review of systems (except as listed in HPI above).     Objective:   Physical Exam   BP 124/72   Pulse 80   Temp (!) 97.5 F (36.4 C) (Oral)   Wt 127 lb (57.6 kg)   SpO2 97%   BMI 24.39 kg/m  Wt Readings from Last 3 Encounters:  02/18/18 127 lb (57.6 kg)  02/14/18 127 lb 12.8 oz (58 kg)  12/24/17 132 lb (59.9 kg)    General: Appears her stated age, well developed, well nourished in NAD. HEENT: Throat/Mouth: Teeth present, mucosa pink and moist, no exudate, lesions or ulcerations noted.  Cardiovascular: Normal rate and rhythm. S1,S2 noted.  No murmur, rubs or gallops noted.  Pulmonary/Chest: Normal effort with diminished breath sounds. No respiratory distress. No wheezes, rales or ronchi noted.  Abdomen: Soft and nontender. Normal bowel  sounds. No distention or masses noted.  Neurological: Alert and oriented.    BMET    Component Value Date/Time   NA 137 12/11/2017 1436   K 3.7 12/11/2017 1436   CL 102 12/11/2017 1436   CO2 28 12/11/2017 1436   GLUCOSE 120 (H) 12/11/2017 1436   BUN 19 12/11/2017 1436   CREATININE 0.82  12/11/2017 1436   CALCIUM 9.4 12/11/2017 1436   GFRNONAA >60 05/11/2017 1137   GFRAA >60 05/11/2017 1137    Lipid Panel     Component Value Date/Time   CHOL 154 12/11/2017 1436   TRIG 79.0 12/11/2017 1436   HDL 52.80 12/11/2017 1436   CHOLHDL 3 12/11/2017 1436   VLDL 15.8 12/11/2017 1436   LDLCALC 86 12/11/2017 1436    CBC    Component Value Date/Time   WBC 7.0 02/14/2018 1356   RBC 4.40 02/14/2018 1356   HGB 13.2 02/14/2018 1356   HCT 40.2 02/14/2018 1356   PLT 209.0 02/14/2018 1356   MCV 91.3 02/14/2018 1356   MCH 30.5 05/11/2017 1137   MCHC 32.9 02/14/2018 1356   RDW 14.9 02/14/2018 1356   LYMPHSABS 1.6 02/14/2018 1356   MONOABS 0.7 02/14/2018 1356   EOSABS 0.5 02/14/2018 1356   BASOSABS 0.1 02/14/2018 1356    Hgb A1C Lab Results  Component Value Date   HGBA1C 5.6 02/08/2015           Assessment & Plan:   Cough, SOB, ILD:  Previous notes, labs and imaging reviewed Continue Albuterol Will give RX for spacer Referral to pulmonology placed Keep appt for echo Will defer to pulm about putting pt on PPI for large hiatal hernia. She is asymptomatic but could be beneficial given breathing issues  Return precautions discussed Webb Silversmith, NP

## 2018-02-24 ENCOUNTER — Institutional Professional Consult (permissible substitution): Payer: Medicare Other | Admitting: Internal Medicine

## 2018-02-24 NOTE — Progress Notes (Signed)
[facility] Consultation      Assessment and Plan:  Pulmonary fibrosis.  --Uncertain etiology. Her CT findings are not typical of IPF. This may be contributed to by reflux given her large hiatal hernia.  --Will check serology.    Cough and dyspnea.  --Discussed that this is likely because of underlying pulmonary fibrosis, and well as physical compression of lungs by large hiatal hernia, as well as possible reflux.  --Start empirically on Breo inhaler. Continue albuterol as needed. Use tessalon as needed.   Hiatal hernia.  --Large hiatal hernia which may be contributory to her symptoms.  --Start prilosec daily. Discussed lifestyle modifications.   Orders Placed This Encounter  Procedures  . Rheumatoid factor  . Angiotensin converting enzyme  . ANA+ENA+DNA/DS+Scl 70+SjoSSA/B  . Spirometry with Graph   Meds ordered this encounter  Medications  . fluticasone furoate-vilanterol (BREO ELLIPTA) 200-25 MCG/INH AEPB    Sig: Inhale 1 puff into the lungs daily. Rinse mouth after use.    Dispense:  1 each    Refill:  5  . omeprazole (PRILOSEC) 20 MG capsule    Sig: Take 1 capsule (20 mg total) by mouth daily.    Dispense:  30 capsule    Refill:  11  . fluticasone furoate-vilanterol (BREO ELLIPTA) 200-25 MCG/INH AEPB    Sig: Inhale 1 puff into the lungs daily.    Dispense:  28 each    Refill:  0    Order Specific Question:   Lot Number?    Answer:   [id number]    Order Specific Question:   Manufacturer?    Answer:   [employer] [[id number]]    Order Specific Question:   Quantity    Answer:   1   Return in about 3 months (around 05/26/2018).   Date: 02/25/2018  MRN# [id number] Wanda Nelson 11-12-1931    Wanda Nelson is a 85 y.o. old female seen in consultation for chief complaint of:    Chief Complaint  Patient presents with  . Consult    referred by Avie Echevaria, NP  . Cough    dry cough, productive in evening  . Shortness of Breath    increased in last 6  months     HPI:  She has been having dyspnea and cough. She is very active, she walks 3 miles per day every day in one hour in [place]. Sometimes she walks an addition 4-5 miles per day. She found that about 3 weeks ago she could not go as quickly as she used to.  She has had a cough for about a year, it has gotten worse over the past few months. She also feels that when she gets a cold which goes into bronchitis.  She tried is not taking tessalon much now as her cough is ok.  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 She never smoked, her husband smoked for about 3-4 years.  No hemoptysis.  She has reflux which happens when she eats fast or too much. She takes tums which helps. She was diagnosed with a hiatal hernia in 2014.  She has a dog, sleep in bedroom, has had it for about 8 months.  Denies sinus drainage.   **CT chest 02/17/2018>> imaging personally reviewed, there are subpleural reticulation changes, no definitive honeycombing.  Change with traction bronchiectasis are seen.  Overall these changes appear suggestive of pulmonary fibrosis.  There is a large hiatal hernia/intrathoracic stomach.  Large hiatal hernia was not  seen on previous scan on 05/11/2017. **Spirometry 02/25/18>>FVC 26%, FEV118%, ratio 52%. Exp time of 5.2s. Over this test showed severe obstruction and restriction but was a suboptimal test due to inadequate coordination and expiratory time.   PMHX:   Past Medical History:  Diagnosis Date  . Anemia   . Glaucoma   . History of blood transfusion   . Hyperlipidemia   . Stroke [facility])    Surgical Hx:  Past Surgical History:  Procedure Laterality Date  . ABDOMINAL HYSTERECTOMY  1982   total  . BUNIONECTOMY Bilateral   . RECTOCELE REPAIR    . TONSILLECTOMY     Family Hx:  Family History  Problem Relation Age of Onset  . Colon cancer Brother   . Colon cancer Maternal Uncle   . Colon cancer Maternal Grandmother    Social Hx:   Social History    Tobacco Use  . Smoking status: Never Smoker  . Smokeless tobacco: Never Used  Substance Use Topics  . Alcohol use: No    Alcohol/week: 0.0 standard drinks  . Drug use: No   Medication:    Current Outpatient Medications:  .  albuterol (PROVENTIL HFA;VENTOLIN HFA) 108 (90 Base) MCG/ACT inhaler, Inhale 1-2 puffs into the lungs every 6 (six) hours as needed for wheezing or shortness of breath., Disp: 1 Inhaler, Rfl: 0 .  aspirin 81 MG tablet, Take 81 mg by mouth daily., Disp: , Rfl:  .  atorvastatin (LIPITOR) 20 MG tablet, Take 1 tablet (20 mg total) by mouth daily., Disp: 90 tablet, Rfl: 3 .  benzonatate (TESSALON) 200 MG capsule, Take 1 capsule (200 mg total) by mouth 3 (three) times daily as needed for cough., Disp: 30 capsule, Rfl: 0 .  Calcium Carbonate-Vitamin D (CALCIUM 500 + D) 500-125 MG-UNIT TABS, Take 1 tablet by mouth daily., Disp: , Rfl:  .  cholecalciferol (VITAMIN D) 1000 units tablet, Take 1,000 Units by mouth daily., Disp: , Rfl:  .  Docusate Sodium (STOOL SOFTENER) 100 MG capsule, Take 100 mg by mouth daily., Disp: , Rfl:  .  ferrous sulfate (FEROSUL) 325 (65 FE) MG tablet, Take 2 tablets (650 mg total) by mouth daily with breakfast. MUST SCHEDULE ANNUAL PHYSICAL, Disp: 180 tablet, Rfl: 3 .  Flaxseed, Linseed, (FLAX SEED OIL) 1300 MG CAPS, Take 1,100 mg by mouth daily. , Disp: , Rfl:  .  Multiple Vitamin (MULTIVITAMIN) tablet, Take 1 tablet by mouth daily., Disp: , Rfl:  .  Multiple Vitamins-Minerals (MULTIVITAMIN ADULTS PO), Take by mouth., Disp: , Rfl:  .  Omega-3 Fatty Acids (FISH OIL) 1200 MG CAPS, Take 2,400 mg by mouth 2 (two) times daily., Disp: , Rfl:  .  psyllium (REGULOID) 0.52 g capsule, Take 0.52 g by mouth daily., Disp: , Rfl:  .  Spacer/Aero-Holding Chambers DEVI, 1 Device by Does not apply route as needed., Disp: 1 each, Rfl: 0 .  UNABLE TO FIND, Take by mouth., Disp: , Rfl:  .  UNABLE TO FIND, Take by mouth., Disp: , Rfl:  .  UNABLE TO FIND, Take by  mouth., Disp: , Rfl:    Allergies:  Bee venom; Codeine; Morphine and related; Shellfish allergy; and Macrobid [nitrofurantoin]  Review of Systems: Gen:  Denies  fever, sweats, chills HEENT: Denies blurred vision, double vision. bleeds, sore throat Cvc:  No dizziness, chest pain. Resp:   Denies cough or sputum production, shortness of breath Gi: Denies swallowing difficulty, stomach pain. Gu:  Denies bladder incontinence, burning urine Ext:  No Joint pain, stiffness. Skin: No skin rash,  hives  Endoc:  No polyuria, polydipsia. Psych: No depression, insomnia. Other:  All other systems were reviewed with the patient and were negative other that what is mentioned in the HPI.   Physical Examination:   VS: BP 112/64 (BP Location: Left Arm, Cuff Size: Normal)   Pulse 89   Ht 5\' 4"  (1.626 m)   Wt 127 lb 12.8 oz (58 kg)   SpO2 94%   BMI 21.94 kg/m   General Appearance: No distress  Neuro:without focal findings,  speech normal,  HEENT: PERRLA, EOM intact.   Pulmonary: normal breath sounds, No wheezing.  CardiovascularNormal S1,S2.  No m/r/g.   Abdomen: Benign, Soft, non-tender. Renal:  No costovertebral tenderness  GU:  No performed at this time. Endoc: No evident thyromegaly, no signs of acromegaly. Skin:   warm, no rashes, no ecchymosis  Extremities: normal, no cyanosis, clubbing.  Other findings:    LABORATORY PANEL:   CBC No results for input(s): WBC, HGB, HCT, PLT in the last 168 hours. ------------------------------------------------------------------------------------------------------------------  Chemistries  No results for input(s): NA, K, CL, CO2, GLUCOSE, BUN, CREATININE, CALCIUM, MG, AST, ALT, ALKPHOS, BILITOT in the last 168 hours.  Invalid input(s): GFRCGP ------------------------------------------------------------------------------------------------------------------  Cardiac Enzymes No results for input(s): TROPONINI in the last 168  hours. ------------------------------------------------------------  RADIOLOGY:  No results found.     Thank  you for the consultation and for allowing [facility] to assist in the care of your patient. Our recommendations are noted above.  Please contact us if we can be of further service.   Marda Stalker, M.D., F.C.C.P.  Board Certified in Internal Medicine, Pulmonary Medicine, [place], and Sleep Medicine.  [facility] Office Number: [phone]   02/25/2018

## 2018-02-25 ENCOUNTER — Ambulatory Visit (INDEPENDENT_AMBULATORY_CARE_PROVIDER_SITE_OTHER): Payer: Medicare Other | Admitting: Internal Medicine

## 2018-02-25 ENCOUNTER — Other Ambulatory Visit
Admission: RE | Admit: 2018-02-25 | Discharge: 2018-02-25 | Disposition: A | Discharge status: Home / Self Care | Payer: Medicare Other | Source: Ambulatory Visit | Attending: Internal Medicine | Admitting: Internal Medicine

## 2018-02-25 ENCOUNTER — Encounter: Payer: Self-pay | Admitting: Internal Medicine

## 2018-02-25 VITALS — BP 112/64 | HR 89 | Ht 64.0 in | Wt 127.8 lb

## 2018-02-25 DIAGNOSIS — R0600 Dyspnea, unspecified: Secondary | ICD-10-CM

## 2018-02-25 DIAGNOSIS — J84112 Idiopathic pulmonary fibrosis: Secondary | ICD-10-CM

## 2018-02-25 MED ORDER — FLUTICASONE FUROATE-VILANTEROL 200-25 MCG/INH IN AEPB: 1.0000 | INHALATION_SPRAY | Freq: Every day | RESPIRATORY_TRACT | 0 refills | Status: DC

## 2018-02-25 MED ORDER — OMEPRAZOLE 20 MG PO CPDR: 20.0000 mg | DELAYED_RELEASE_CAPSULE | Freq: Every day | ORAL | 11 refills | Status: DC

## 2018-02-25 MED ORDER — FLUTICASONE FUROATE-VILANTEROL 200-25 MCG/INH IN AEPB: 1.0000 | INHALATION_SPRAY | Freq: Every day | RESPIRATORY_TRACT | 5 refills | Status: DC

## 2018-02-25 NOTE — Patient Instructions (Addendum)
Start Breo inhaler once daily. Rinse mouth after use.  Start omeprazole once daily.  Continue tessalon (benzonatate) 2 tablets as needed for cough.    --10 REFLUX RULES TO LIVE BY---  1. Limit liquids to no more than 6oz/hour. (A liquid includes smoothies, yogurt, soup, ice cream, etc).   2. STOP ALL LIQUIDS 3 hours before bed.   3. Stop all solid food 4 hours before bedtime.   4. Move bedtimes meds to evening meal (except for sleeping medications).   5. High fat foods slow gastric movement, if you are going to eat something with high fat, eat it for lunch, and not dinner.   6. Sleep elevated, preferably on an adjustable bed with upper body elevated between 30-45 degree and the knees slightly elevated and flexed. (Sleep on a few pillows is not nearly enough!).   7. NEVER sleep on your right side or stomach-- switch sides of the bed if necessary so you wont sleep on your right side.   8. Avoid these products as tey ALL will make silent reflux worse for several hours: Alcohol, caffeine, carobated beverages, chocolate, fatty foods, tomato sauces.   9. Remember that there is NO medication that stops reflux! Medications only lower the acid content in the stomach juices.   10. Only CARBOHYDRATES, GRAINS, AND STARCHES absorb the stomach liquids so they will not easily reflux high enough to be aspirated.

## 2018-02-25 NOTE — Progress Notes (Signed)
Patient seen in the office today and instructed on use of Breo 200.  Patient expressed understanding and demonstrated technique.  

## 2018-02-26 LAB — RHEUMATOID FACTOR: Rheumatoid fact SerPl-aCnc: 33.8 IU/mL — ABNORMAL HIGH (ref 0.0–13.9)

## 2018-02-26 LAB — ANGIOTENSIN CONVERTING ENZYME: Angiotensin-Converting Enzyme: 26 U/L (ref 14–82)

## 2018-03-03 ENCOUNTER — Ambulatory Visit: Payer: Medicare Other

## 2018-03-10 ENCOUNTER — Ambulatory Visit: Payer: Medicare Other

## 2018-03-17 ENCOUNTER — Emergency Department
Admission: EM | Admit: 2018-03-17 | Discharge: 2018-03-17 | Disposition: A | Discharge status: Home / Self Care | Payer: Medicare Other | Attending: Emergency Medicine | Admitting: Emergency Medicine

## 2018-03-17 ENCOUNTER — Other Ambulatory Visit: Payer: Self-pay

## 2018-03-17 ENCOUNTER — Telehealth: Payer: Self-pay | Admitting: *Deleted

## 2018-03-17 ENCOUNTER — Ambulatory Visit (HOSPITAL_BASED_OUTPATIENT_CLINIC_OR_DEPARTMENT_OTHER)
Admission: RE | Admit: 2018-03-17 | Discharge: 2018-03-17 | Disposition: A | Discharge status: Home / Self Care | Payer: Medicare Other | Source: Ambulatory Visit | Attending: Internal Medicine | Admitting: Internal Medicine

## 2018-03-17 DIAGNOSIS — E785 Hyperlipidemia, unspecified: Secondary | ICD-10-CM

## 2018-03-17 DIAGNOSIS — R0600 Dyspnea, unspecified: Secondary | ICD-10-CM | POA: Insufficient documentation

## 2018-03-17 DIAGNOSIS — R0602 Shortness of breath: Secondary | ICD-10-CM | POA: Insufficient documentation

## 2018-03-17 DIAGNOSIS — D151 Benign neoplasm of heart: Secondary | ICD-10-CM

## 2018-03-17 HISTORY — DX: Benign neoplasm of heart: D15.1

## 2018-03-17 LAB — CBC
HCT: 44.5 % (ref 36.0–46.0)
HEMOGLOBIN: 14.6 g/dL (ref 12.0–15.0)
MCH: 29.9 pg (ref 26.0–34.0)
MCHC: 32.8 g/dL (ref 30.0–36.0)
MCV: 91.2 fL (ref 80.0–100.0)
Platelets: 209 10*3/uL (ref 150–400)
RBC: 4.88 MIL/uL (ref 3.87–5.11)
RDW: 14.2 % (ref 11.5–15.5)
WBC: 8 10*3/uL (ref 4.0–10.5)
nRBC: 0 % (ref 0.0–0.2)

## 2018-03-17 LAB — BASIC METABOLIC PANEL
Anion gap: 9 (ref 5–15)
BUN: 18 mg/dL (ref 8–23)
CO2: 25 mmol/L (ref 22–32)
Calcium: 9.5 mg/dL (ref 8.9–10.3)
Chloride: 106 mmol/L (ref 98–111)
Creatinine, Ser: 0.78 mg/dL (ref 0.44–1.00)
GFR calc Af Amer: >60 mL/min (ref >60)
GFR calc non Af Amer: >60 mL/min (ref >60)
Glucose, Bld: 115 mg/dL — ABNORMAL HIGH (ref 70–99)
Potassium: 3.8 mmol/L (ref 3.5–5.1)
Sodium: 140 mmol/L (ref 135–145)

## 2018-03-17 LAB — TROPONIN I: Troponin I: <0.03 ng/mL (ref <0.03)

## 2018-03-17 NOTE — Progress Notes (Signed)
*  PRELIMINARY RESULTS* Echocardiogram 2D Echocardiogram has been performed.  Wanda Nelson 03/17/2018, 10:42 AM

## 2018-03-17 NOTE — Telephone Encounter (Signed)
Called and got patient scheduled for 03/19/18 at [facility] at 2 pm, arrival of 12 noon.  Message sent to pre-cert.  No answer. Left message to call back on patient's phone.  Called patient's daughter-in-law, ok per DPR.  She verbalized understanding of the below instructions.  Patient has [place]. ROuting to Dr Fletcher Anon to confirm wants to do pre-med.   [address] [place], [place] [address] Dept: [phone] Loc: [place]  03/17/2018  You are scheduled for a Cardiac Catheterization on Wednesday, March 11 with Dr. [name].  1. Please arrive at the [facility] (Main Entrance A) at [facility]: [address] [place], [place] [phone] at 12:00 PM (This time is two hours before your procedure to ensure your preparation). Free valet parking service is available.   Special note: Every effort is made to have your procedure done on time. Please understand that emergencies sometimes delay scheduled procedures.  2. Diet: Do not eat solid foods after midnight.  The patient may have clear liquids until 5am upon the day of the procedure.  3. Labs: You will need to have blood drawn on Monday, March 9 at [facility], Go to 1st desk on your right to register.  Address: [address] [place], [place] [address]  Open: 8am - 5pm  Phone: [phone]. You do not need to be fasting.  4. Medication instructions in preparation for your procedure:   Contrast Allergy: SHELLFISH ALLERGY   On the morning of your procedure, take your Aspirin and any morning medicines NOT listed above.  You may use sips of water.  5. Plan for one night stay--bring personal belongings. 6. Bring a current list of your medications and current insurance cards. 7. You MUST have a responsible person to drive you home. 8. Someone MUST be with you the first 24 hours  after you arrive home or your discharge will be delayed. 9. Please wear clothes that are easy to get on and off and wear slip-on shoes.  Thank you for allowing [place] to care for you!   -- [facility]

## 2018-03-17 NOTE — Consult Note (Signed)
Cardiology Consultation:   Patient ID: [name]; [id number]; 1931-09-19   Admit date: 03/17/2018 Date of Consult: 03/17/2018  Primary Care Provider: Jearld Fenton, NP Primary Cardiologist: new to [facility] - consult by Fletcher Anon   Patient Profile:   Wanda Nelson is a 85 y.o. female with a hx of ILD/pulmonary fibrosis, stroke, aortic atherosclerosis by chest CT, large hiatal hernia, HLD, and glaucoma who is being seen today for the evaluation of atrial mass noted on echo in the office today at the request of Dr. Jimmye Norman.  History of Present Illness:   Wanda Nelson has no previously known cardiac history. She has noted increased SOB and cough dating back to late 01/2018. At baseline, she ambulates 3 miles per day at [facility] in about 1 hour. Starting in late 01/2018, she noticed it was taking her longer to make this walk (65 minutes opposed to her baseline 60 minutes). She noted a cough that had been present for about 1 year.  In this setting, she underwent high resolution chest CT on 02/17/2018 that was consistent with pulmonary fibrosis. She was seen by pulmonology following this with the diagnosis being of uncertain etiology and her CT not being of typical IPF per notes. Patient was in our office this morning for echo as ordered by PCP with preliminary results concerning for a large, mobile atrial myxoma. In this setting, she was referred to the ED.   Patient indicates, since starting inhalers per pulmonology and a PPI, her symptoms have significantly improved and she is back to her baseline. She is now walking her 3 miles in 60 minutes again. This morning she ambulated 2.5 miles without issues. She reports walking around all day on 3/6 with her daughter without issues. No focal weakness, paresthesias, vision changes, or gait instability. Currently, without complaints.   Past Medical History:  Diagnosis Date  . Anemia   . Glaucoma   . History of blood transfusion   . Hyperlipidemia     . Stroke [facility])     Past Surgical History:  Procedure Laterality Date  . ABDOMINAL HYSTERECTOMY  1982   total  . BUNIONECTOMY Bilateral   . RECTOCELE REPAIR    . TONSILLECTOMY       Home Meds: Prior to Admission medications   Medication Sig Start Date End Date Taking? Authorizing Provider  albuterol (PROVENTIL HFA;VENTOLIN HFA) 108 (90 Base) MCG/ACT inhaler Inhale 1-2 puffs into the lungs every 6 (six) hours as needed for wheezing or shortness of breath. 02/14/18   McLean-Scocuzza, Nino Glow, MD  aspirin 81 MG tablet Take 81 mg by mouth daily.    [provider]  atorvastatin (LIPITOR) 20 MG tablet Take 1 tablet (20 mg total) by mouth daily. 12/11/17   Jearld Fenton, NP  benzonatate (TESSALON) 200 MG capsule Take 1 capsule (200 mg total) by mouth 3 (three) times daily as needed for cough. 12/11/17   Jearld Fenton, NP  Calcium Carbonate-Vitamin D (CALCIUM 500 + D) 500-125 MG-UNIT TABS Take 1 tablet by mouth daily.    [provider]  cholecalciferol (VITAMIN D) 1000 units tablet Take 1,000 Units by mouth daily.    [provider]  Docusate Sodium (STOOL SOFTENER) 100 MG capsule Take 100 mg by mouth daily.    [provider]  ferrous sulfate (FEROSUL) 325 (65 FE) MG tablet Take 2 tablets (650 mg total) by mouth daily with breakfast. MUST SCHEDULE ANNUAL PHYSICAL 12/11/17   Jearld Fenton, NP  Flaxseed, Linseed, (FLAX SEED OIL) 1300 MG CAPS Take 1,100 mg by mouth daily.     [provider]  fluticasone furoate-vilanterol (BREO ELLIPTA) 200-25 MCG/INH AEPB Inhale 1 puff into the lungs daily. Rinse mouth after use. 02/25/18   Laverle Hobby, MD  fluticasone furoate-vilanterol (BREO ELLIPTA) 200-25 MCG/INH AEPB Inhale 1 puff into the lungs daily. 02/25/18   Laverle Hobby, MD  Multiple Vitamin (MULTIVITAMIN) tablet Take 1 tablet by mouth daily.    [provider]  Multiple Vitamins-Minerals (MULTIVITAMIN ADULTS PO) Take by mouth.     [provider]  Omega-3 Fatty Acids (FISH OIL) 1200 MG CAPS Take 2,400 mg by mouth 2 (two) times daily.    [provider]  omeprazole (PRILOSEC) 20 MG capsule Take 1 capsule (20 mg total) by mouth daily. 02/25/18   Laverle Hobby, MD  psyllium (REGULOID) 0.52 g capsule Take 0.52 g by mouth daily.    [provider]  Spacer/Aero-Holding Chambers DEVI 1 Device by Does not apply route as needed. 02/18/18   Jearld Fenton, NP  UNABLE TO FIND Take by mouth.    [provider]  UNABLE TO FIND Take by mouth.    [provider]  UNABLE TO FIND Take by mouth.    [provider]    Inpatient Medications: Scheduled Meds:  Continuous Infusions:  PRN Meds:   Allergies:   Allergies  Allergen Reactions  . Bee Venom Anaphylaxis and Hives  . Codeine Other (See Comments)    Altered mental status Altered mental status  . Morphine And Related     Altered mental status  . Shellfish Allergy Anaphylaxis and Hives    Mainly shrimp  . Macrobid [Nitrofurantoin] Rash    Social History:   Social History   Socioeconomic History  . Marital status: Married    Spouse name: Not on file  . Number of children: Not on file  . Years of education: Not on file  . Highest education level: Not on file  Occupational History  . Not on file  Social Needs  . Financial resource strain: Not on file  . Food insecurity:    Worry: Not on file    Inability: Not on file  . Transportation needs:    Medical: Not on file    Non-medical: Not on file  Tobacco Use  . Smoking status: Never Smoker  . Smokeless tobacco: Never Used  Substance and Sexual Activity  . Alcohol use: No    Alcohol/week: 0.0 standard drinks  . Drug use: No  . Sexual activity: Never  Lifestyle  . Physical activity:    Days per week: Not on file    Minutes per session: Not on file  . Stress: Not on file  Relationships  . Social connections:    Talks on phone: Not on file     Gets together: Not on file    Attends religious service: Not on file    Active member of club or organization: Not on file    Attends meetings of clubs or organizations: Not on file    Relationship status: Not on file  . Intimate partner violence:    Fear of current or ex partner: Not on file    Emotionally abused: Not on file    Physically abused: Not on file    Forced sexual activity: Not on file  Other Topics Concern  . Not on file  Social History Narrative   [place]  Family History:   Family History  Problem Relation Age of Onset  . Colon cancer Brother   . Colon cancer Maternal Uncle   . Colon cancer Maternal Grandmother     ROS:  Review of Systems  Constitutional: Negative for chills, diaphoresis, fever, malaise/fatigue and weight loss.  HENT: Negative for congestion.   Eyes: Negative for discharge and redness.  Respiratory: Positive for cough and shortness of breath. Negative for hemoptysis, sputum production and wheezing.        Much improved  Cardiovascular: Negative for chest pain, palpitations, orthopnea, claudication, leg swelling and PND.  Gastrointestinal: Negative for abdominal pain, blood in stool, heartburn, melena, nausea and vomiting.  Genitourinary: Negative for hematuria.  Musculoskeletal: Negative for falls and myalgias.  Skin: Negative for rash.  Neurological: Negative for dizziness, tingling, tremors, sensory change, speech change, focal weakness, loss of consciousness and weakness.  Endo/Heme/Allergies: Does not bruise/bleed easily.  Psychiatric/Behavioral: Negative for substance abuse. The patient is not nervous/anxious.   All other systems reviewed and are negative.     Physical Exam/Data:   Vitals:   03/17/18 1100 03/17/18 1101  BP: (!) 149/83   Pulse: 86   Resp: 18   Temp: (!) 97.5 F (36.4 C)   TempSrc: Oral   SpO2: 100%   Weight:  57 kg  Height:  5\' 4"  (1.626 m)   No intake or output data in the 24 hours ending  03/17/18 1116 Filed Weights   03/17/18 1101  Weight: 57 kg   Body mass index is 21.57 kg/m.   Physical Exam: General: Well developed, well nourished, in no acute distress. Head: Normocephalic, atraumatic, sclera non-icteric, no xanthomas, nares without discharge.  Neck: Negative for carotid bruits. JVD not elevated. Lungs: Clear bilaterally to auscultation without wheezes, rales, or rhonchi. Breathing is unlabored. Heart: RRR with S1 S2. No murmurs, rubs, or gallops appreciated. Abdomen: Soft, non-tender, non-distended with normoactive bowel sounds. No hepatomegaly. No rebound/guarding. No obvious abdominal masses. Msk:  Strength and tone appear normal for age. Extremities: No clubbing or cyanosis. No edema. Distal pedal pulses are 2+ and equal bilaterally. Neuro: Alert and oriented X 3. No facial asymmetry. No focal deficit. Moves all extremities spontaneously. Psych:  Responds to questions appropriately with a normal affect.   EKG:  The EKG was personally reviewed and demonstrates: NSR, 83 bpm, possible left atrial enlargement, poor R wave progression through the precordial leads, nonspecific st/t changes  Telemetry:  Telemetry was personally reviewed and demonstrates: NSR  Weights: Filed Weights   03/17/18 1101  Weight: 57 kg    Relevant CV Studies: 2D Echo 03/17/2018: 1. The left ventricle has normal systolic function, with an ejection fraction of 55-60%. The cavity size was normal. There is concentric left ventricular hypertrophy. Left ventricular diastolic Doppler parameters are consistent with impaired relaxation.  2. The right ventricle has normal systolic function. The cavity was normal. There is no increase in right ventricular wall thickness.  3. Large ( 2.4 X 2.5 cm ) mobile left atrial mass attached to the fossa ovalis/atrial septum. The mass is pedunculated in shape and solid in texture. The left atrial mass is suggestive of a myxoma. It protrudes into the mitral  inflow.  4. The mitral valve is normal in structure.  5. The tricuspid valve is normal in structure.  6. The pulmonic valve was normal in structure. Pulmonic valve regurgitation is mild by color flow Doppler.  7. The aortic valve is tricuspid.  Laboratory Data:  ChemistryNo results for  input(s): NA, K, CL, CO2, GLUCOSE, BUN, CREATININE, CALCIUM, GFRNONAA, GFRAA, ANIONGAP in the last 168 hours.  No results for input(s): PROT, ALBUMIN, AST, ALT, ALKPHOS, BILITOT in the last 168 hours. HematologyNo results for input(s): WBC, RBC, HGB, HCT, MCV, MCH, MCHC, RDW, PLT in the last 168 hours. Cardiac EnzymesNo results for input(s): TROPONINI in the last 168 hours. No results for input(s): TROPIPOC in the last 168 hours.  BNPNo results for input(s): BNP, PROBNP in the last 168 hours.  DDimer No results for input(s): DDIMER in the last 168 hours.  Radiology/Studies:  No results found.  Assessment and Plan:   1. Atrial mass: -Concerning for atrial myxoma -Asymptomatic, ambulated 2.5 miles this morning (cut short secondary to echo appointment) -Will discuss with MD regarding possible transfer to [facility] vs urgent [facility] and CVTS evaluation as an outpatient   2. Pulmonary fibrosis/dyspnea: -Followed by pulmonology  -PTA Breo and albuterol   3. HLD: -Lipitor  4. Anemia: -CBC stable -PTA iron  5. GERD: -PPI   For questions or updates, please contact [place] Please consult [email or web address] for contact info under Cardiology/STEMI.   Signed, Christell Faith, PA-C [place] Pager: [phone] 03/17/2018, 11:16 AM

## 2018-03-17 NOTE — ED Provider Notes (Addendum)
[facility] Emergency Department Provider Note       Time seen: ----------------------------------------- 11:06 AM on 03/17/2018 -----------------------------------------   I have reviewed the triage vital signs and the nursing notes.  HISTORY   Chief Complaint Shortness of Breath    HPI Wanda Nelson is a 85 y.o. female with a history of anemia, hyperlipidemia, CVA who presents to the ED for possible mass visualized on echocardiogram today.  Patient states she was receiving an echocardiogram due to some shortness of breath she has had for several weeks.  Patient states she already walked 3 miles today.  She is otherwise asymptomatic.  She denies recent illness or other complaints.  Past Medical History:  Diagnosis Date  . Anemia   . Glaucoma   . History of blood transfusion   . Hyperlipidemia   . Stroke [facility])     Patient Active Problem List   Diagnosis Date Noted  . HLD (hyperlipidemia) 03/15/2015  . History of stroke 03/15/2015  . Glaucoma 03/15/2015    Past Surgical History:  Procedure Laterality Date  . ABDOMINAL HYSTERECTOMY  1982   total  . BUNIONECTOMY Bilateral   . RECTOCELE REPAIR    . TONSILLECTOMY      Allergies Bee venom; Codeine; Morphine and related; Shellfish allergy; and Macrobid [nitrofurantoin]  Social History Social History   Tobacco Use  . Smoking status: Never Smoker  . Smokeless tobacco: Never Used  Substance Use Topics  . Alcohol use: No    Alcohol/week: 0.0 standard drinks  . Drug use: No    Review of Systems Constitutional: Negative for fever. Cardiovascular: Negative for chest pain. Respiratory: Positive for shortness of breath Gastrointestinal: Negative for abdominal pain, vomiting and diarrhea. Musculoskeletal: Negative for back pain. Skin: Negative for rash. Neurological: Negative for headaches, focal weakness or numbness.  All systems negative/normal/unremarkable except as stated in the  HPI  ____________________________________________   PHYSICAL EXAM:  VITAL SIGNS: ED Triage Vitals  Enc Vitals Group     BP 03/17/18 1100 (!) 149/83     Pulse Rate 03/17/18 1100 86     Resp 03/17/18 1100 18     Temp 03/17/18 1100 (!) 97.5 F (36.4 C)     Temp Source 03/17/18 1100 Oral     SpO2 03/17/18 1100 100 %     Weight 03/17/18 1101 125 lb 10.6 oz (57 kg)     Height 03/17/18 1101 5\' 4"  (1.626 m)     Head Circumference --      Peak Flow --      Pain Score 03/17/18 1101 0     Pain Loc --      Pain Edu? --      Excl. in [place]? --    Constitutional: Alert and oriented. Well appearing and in no distress. Eyes: Conjunctivae are normal. Normal extraocular movements. ENT      Head: Normocephalic and atraumatic.      Nose: No congestion/rhinnorhea.      Mouth/Throat: Mucous membranes are moist.      Neck: No stridor. Cardiovascular: Normal rate, regular rhythm. No murmurs, rubs, or gallops. Respiratory: Normal respiratory effort without tachypnea nor retractions. Breath sounds are clear and equal bilaterally. No wheezes/rales/rhonchi. Gastrointestinal: Soft and nontender. Normal bowel sounds Musculoskeletal: Nontender with normal range of motion in extremities. No lower extremity tenderness nor edema. Neurologic:  Normal speech and language. No gross focal neurologic deficits are appreciated.  Skin:  Skin is warm, dry and intact. No rash noted. Psychiatric: Mood  and affect are normal. Speech and behavior are normal.  ____________________________________________  EKG: Interpreted by me.  Sinus rhythm with a rate of 83 bpm, normal PR interval, normal QRS, normal QT  ____________________________________________  ED COURSE:  As part of my medical decision making, I reviewed the following data within the [place] History obtained from family if available, nursing notes, old chart and ekg, as well as notes from prior ED visits. Patient presented for shortness of  breath with abnormal echocardiogram, we will assess with labs and imaging as indicated at this time.   Procedures ____________________________________________   LABS (pertinent positives/negatives)  Labs Reviewed  BASIC METABOLIC PANEL - Abnormal; Notable for the following components:      Result Value   Glucose, Bld 115 (*)    All other components within normal limits  CBC  TROPONIN I   ____________________________________________   DIFFERENTIAL DIAGNOSIS   PE, mass, thrombus, myxoma  FINAL ASSESSMENT AND PLAN  Dyspnea, possible cardiac myxoma   Plan: The patient had presented for dyspnea for the past 3 weeks with abnormal echocardiogram today. Patient's labs were reassuring. Patient's imaging reportedly revealed a cardiac myxoma on echocardiogram.  Initially the thought was that she needed urgent transfer to [facility].  She will have outpatient cardiac cath and procedure to remove the myxoma.  Patient and family are agreeable to this plan.   Laurence Aly, MD    Note: This note was generated in part or whole with voice recognition software. Voice recognition is usually quite accurate but there are transcription errors that can and very often do occur. I apologize for any typographical errors that were not detected and corrected.     Earleen [place], MD 03/17/18 1209    Earleen [place], MD 03/17/18 1336

## 2018-03-17 NOTE — Telephone Encounter (Signed)
-----   Message from Wellington Hampshire, MD sent at 03/17/2018  1:53 PM EDT ----- This patient was seen in the ED today with left atrial myxoma.  We need to schedule urgent right and left cardiac catheterization to be done on Wednesday of this week with me at [facility].  I already explained to the patient and family the procedure and she had labs done in the ED and thus she does not require any more labs or x-rays.  Please discuss the instructions with the patient's daughter [name]) who is a previous cardiology nurse at [employer].

## 2018-03-17 NOTE — ED Notes (Signed)
Pt up to toilet at this time. ?

## 2018-03-17 NOTE — ED Notes (Signed)
Dr Fletcher Anon at bedside, family at bedside, pt in no distress at this time.

## 2018-03-17 NOTE — ED Triage Notes (Signed)
SOBX 3 weeks. Had ECHO performed today and showed a mass and referred to ER. Pt reports that she feels fine, has walked 3 miles today, smiling at time of triage. Pt alert and oriented X4, active, cooperative, pt in NAD. RR even and unlabored, color WNL.

## 2018-03-17 NOTE — H&P (View-Only) (Signed)
Cardiology Consultation:   Patient ID: Wanda Nelson; [id number]; September 10, 1931   Admit date: 03/17/2018 Date of Consult: 03/17/2018  Primary Care Provider: Jearld Fenton, NP Primary Cardiologist: new to [facility] - consult by Fletcher Anon   Patient Profile:   Wanda Nelson is a 85 y.o. female with a hx of ILD/pulmonary fibrosis, stroke, aortic atherosclerosis by chest CT, large hiatal hernia, HLD, and glaucoma who is being seen today for the evaluation of atrial mass noted on echo in the office today at the request of Dr. Jimmye Norman.  History of Present Illness:   Wanda Nelson has no previously known cardiac history. She has noted increased SOB and cough dating back to late 01/2018. At baseline, she ambulates 3 miles per day at [facility] in about 1 hour. Starting in late 01/2018, she noticed it was taking her longer to make this walk (65 minutes opposed to her baseline 60 minutes). She noted a cough that had been present for about 1 year.  In this setting, she underwent high resolution chest CT on 02/17/2018 that was consistent with pulmonary fibrosis. She was seen by pulmonology following this with the diagnosis being of uncertain etiology and her CT not being of typical IPF per notes. Patient was in our office this morning for echo as ordered by PCP with preliminary results concerning for a large, mobile atrial myxoma. In this setting, she was referred to the ED.   Patient indicates, since starting inhalers per pulmonology and a PPI, her symptoms have significantly improved and she is back to her baseline. She is now walking her 3 miles in 60 minutes again. This morning she ambulated 2.5 miles without issues. She reports walking around all day on 3/6 with her daughter without issues. No focal weakness, paresthesias, vision changes, or gait instability. Currently, without complaints.   Past Medical History:  Diagnosis Date  . Anemia   . Glaucoma   . History of blood transfusion   . Hyperlipidemia    . Stroke [facility])     Past Surgical History:  Procedure Laterality Date  . ABDOMINAL HYSTERECTOMY  1982   total  . BUNIONECTOMY Bilateral   . RECTOCELE REPAIR    . TONSILLECTOMY       Home Meds: Prior to Admission medications   Medication Sig Start Date End Date Taking? Authorizing Provider  albuterol (PROVENTIL HFA;VENTOLIN HFA) 108 (90 Base) MCG/ACT inhaler Inhale 1-2 puffs into the lungs every 6 (six) hours as needed for wheezing or shortness of breath. 02/14/18   McLean-Scocuzza, Nino Glow, MD  aspirin 81 MG tablet Take 81 mg by mouth daily.    [provider]  atorvastatin (LIPITOR) 20 MG tablet Take 1 tablet (20 mg total) by mouth daily. 12/11/17   Jearld Fenton, NP  benzonatate (TESSALON) 200 MG capsule Take 1 capsule (200 mg total) by mouth 3 (three) times daily as needed for cough. 12/11/17   Jearld Fenton, NP  Calcium Carbonate-Vitamin D (CALCIUM 500 + D) 500-125 MG-UNIT TABS Take 1 tablet by mouth daily.    [provider]  cholecalciferol (VITAMIN D) 1000 units tablet Take 1,000 Units by mouth daily.    [provider]  Docusate Sodium (STOOL SOFTENER) 100 MG capsule Take 100 mg by mouth daily.    [provider]  ferrous sulfate (FEROSUL) 325 (65 FE) MG tablet Take 2 tablets (650 mg total) by mouth daily with breakfast. MUST SCHEDULE ANNUAL PHYSICAL 12/11/17   Jearld Fenton, NP  Flaxseed,  Linseed, (FLAX SEED OIL) 1300 MG CAPS Take 1,100 mg by mouth daily.     [provider]  fluticasone furoate-vilanterol (BREO ELLIPTA) 200-25 MCG/INH AEPB Inhale 1 puff into the lungs daily. Rinse mouth after use. 02/25/18   Laverle Hobby, MD  fluticasone furoate-vilanterol (BREO ELLIPTA) 200-25 MCG/INH AEPB Inhale 1 puff into the lungs daily. 02/25/18   Laverle Hobby, MD  Multiple Vitamin (MULTIVITAMIN) tablet Take 1 tablet by mouth daily.    [provider]  Multiple Vitamins-Minerals (MULTIVITAMIN ADULTS PO) Take by mouth.     [provider]  Omega-3 Fatty Acids (FISH OIL) 1200 MG CAPS Take 2,400 mg by mouth 2 (two) times daily.    [provider]  omeprazole (PRILOSEC) 20 MG capsule Take 1 capsule (20 mg total) by mouth daily. 02/25/18   Laverle Hobby, MD  psyllium (REGULOID) 0.52 g capsule Take 0.52 g by mouth daily.    [provider]  Spacer/Aero-Holding Chambers DEVI 1 Device by Does not apply route as needed. 02/18/18   Jearld Fenton, NP  UNABLE TO FIND Take by mouth.    [provider]  UNABLE TO FIND Take by mouth.    [provider]  UNABLE TO FIND Take by mouth.    [provider]    Inpatient Medications: Scheduled Meds:  Continuous Infusions:  PRN Meds:   Allergies:   Allergies  Allergen Reactions  . Bee Venom Anaphylaxis and Hives  . Codeine Other (See Comments)    Altered mental status Altered mental status  . Morphine And Related     Altered mental status  . Shellfish Allergy Anaphylaxis and Hives    Mainly shrimp  . Macrobid [Nitrofurantoin] Rash    Social History:   Social History   Socioeconomic History  . Marital status: Married    Spouse name: Not on file  . Number of children: Not on file  . Years of education: Not on file  . Highest education level: Not on file  Occupational History  . Not on file  Social Needs  . Financial resource strain: Not on file  . Food insecurity:    Worry: Not on file    Inability: Not on file  . Transportation needs:    Medical: Not on file    Non-medical: Not on file  Tobacco Use  . Smoking status: Never Smoker  . Smokeless tobacco: Never Used  Substance and Sexual Activity  . Alcohol use: No    Alcohol/week: 0.0 standard drinks  . Drug use: No  . Sexual activity: Never  Lifestyle  . Physical activity:    Days per week: Not on file    Minutes per session: Not on file  . Stress: Not on file  Relationships  . Social connections:    Talks on phone: Not on file     Gets together: Not on file    Attends religious service: Not on file    Active member of club or organization: Not on file    Attends meetings of clubs or organizations: Not on file    Relationship status: Not on file  . Intimate partner violence:    Fear of current or ex partner: Not on file    Emotionally abused: Not on file    Physically abused: Not on file    Forced sexual activity: Not on file  Other Topics Concern  . Not on file  Social History Narrative   [place]  Family History:   Family History  Problem Relation Age of Onset  . Colon cancer Brother   . Colon cancer Maternal Uncle   . Colon cancer Maternal Grandmother     ROS:  Review of Systems  Constitutional: Negative for chills, diaphoresis, fever, malaise/fatigue and weight loss.  HENT: Negative for congestion.   Eyes: Negative for discharge and redness.  Respiratory: Positive for cough and shortness of breath. Negative for hemoptysis, sputum production and wheezing.        Much improved  Cardiovascular: Negative for chest pain, palpitations, orthopnea, claudication, leg swelling and PND.  Gastrointestinal: Negative for abdominal pain, blood in stool, heartburn, melena, nausea and vomiting.  Genitourinary: Negative for hematuria.  Musculoskeletal: Negative for falls and myalgias.  Skin: Negative for rash.  Neurological: Negative for dizziness, tingling, tremors, sensory change, speech change, focal weakness, loss of consciousness and weakness.  Endo/Heme/Allergies: Does not bruise/bleed easily.  Psychiatric/Behavioral: Negative for substance abuse. The patient is not nervous/anxious.   All other systems reviewed and are negative.     Physical Exam/Data:   Vitals:   03/17/18 1100 03/17/18 1101  BP: (!) 149/83   Pulse: 86   Resp: 18   Temp: (!) 97.5 F (36.4 C)   TempSrc: Oral   SpO2: 100%   Weight:  57 kg  Height:  5\' 4"  (1.626 m)   No intake or output data in the 24 hours ending  03/17/18 1116 Filed Weights   03/17/18 1101  Weight: 57 kg   Body mass index is 21.57 kg/m.   Physical Exam: General: Well developed, well nourished, in no acute distress. Head: Normocephalic, atraumatic, sclera non-icteric, no xanthomas, nares without discharge.  Neck: Negative for carotid bruits. JVD not elevated. Lungs: Clear bilaterally to auscultation without wheezes, rales, or rhonchi. Breathing is unlabored. Heart: RRR with S1 S2. No murmurs, rubs, or gallops appreciated. Abdomen: Soft, non-tender, non-distended with normoactive bowel sounds. No hepatomegaly. No rebound/guarding. No obvious abdominal masses. Msk:  Strength and tone appear normal for age. Extremities: No clubbing or cyanosis. No edema. Distal pedal pulses are 2+ and equal bilaterally. Neuro: Alert and oriented X 3. No facial asymmetry. No focal deficit. Moves all extremities spontaneously. Psych:  Responds to questions appropriately with a normal affect.   EKG:  The EKG was personally reviewed and demonstrates: NSR, 83 bpm, possible left atrial enlargement, poor R wave progression through the precordial leads, nonspecific st/t changes  Telemetry:  Telemetry was personally reviewed and demonstrates: NSR  Weights: Filed Weights   03/17/18 1101  Weight: 57 kg    Relevant CV Studies: 2D Echo 03/17/2018: 1. The left ventricle has normal systolic function, with an ejection fraction of 55-60%. The cavity size was normal. There is concentric left ventricular hypertrophy. Left ventricular diastolic Doppler parameters are consistent with impaired relaxation.  2. The right ventricle has normal systolic function. The cavity was normal. There is no increase in right ventricular wall thickness.  3. Large ( 2.4 X 2.5 cm ) mobile left atrial mass attached to the fossa ovalis/atrial septum. The mass is pedunculated in shape and solid in texture. The left atrial mass is suggestive of a myxoma. It protrudes into the mitral inflow.   4. The mitral valve is normal in structure.  5. The tricuspid valve is normal in structure.  6. The pulmonic valve was normal in structure. Pulmonic valve regurgitation is mild by color flow Doppler.  7. The aortic valve is tricuspid.  Laboratory Data:  ChemistryNo results for  input(s): NA, K, CL, CO2, GLUCOSE, BUN, CREATININE, CALCIUM, GFRNONAA, GFRAA, ANIONGAP in the last 168 hours.  No results for input(s): PROT, ALBUMIN, AST, ALT, ALKPHOS, BILITOT in the last 168 hours. HematologyNo results for input(s): WBC, RBC, HGB, HCT, MCV, MCH, MCHC, RDW, PLT in the last 168 hours. Cardiac EnzymesNo results for input(s): TROPONINI in the last 168 hours. No results for input(s): TROPIPOC in the last 168 hours.  BNPNo results for input(s): BNP, PROBNP in the last 168 hours.  DDimer No results for input(s): DDIMER in the last 168 hours.  Radiology/Studies:  No results found.  Assessment and Plan:   1. Atrial mass: -Concerning for atrial myxoma -Asymptomatic, ambulated 2.5 miles this morning (cut short secondary to echo appointment) -Will discuss with MD regarding possible transfer to [facility] vs urgent [facility] and CVTS evaluation as an outpatient   2. Pulmonary fibrosis/dyspnea: -Followed by pulmonology  -PTA Breo and albuterol   3. HLD: -Lipitor  4. Anemia: -CBC stable -PTA iron  5. GERD: -PPI   For questions or updates, please contact [facility] Please consult [email or web address] for contact info under Cardiology/STEMI.   Signed, Christell Faith, PA-C [place] Pager: [phone] 03/17/2018, 11:16 AM

## 2018-03-17 NOTE — ED Notes (Signed)
First Nurse Note: Patient to ED from Echo with reported atrial mass.  Patient to Triage, then to go to Rm 2.  Alert and oriented, color good.

## 2018-03-18 NOTE — Telephone Encounter (Signed)
  Called patient's daughter-in-law and she verbalized understanding that nothing was needed for shellfish allergy per Dr Fletcher Anon.   MyChart message with pre-procedural instructions sent to patient.

## 2018-03-18 NOTE — Telephone Encounter (Signed)
Shellfish allergy is no longer an indication to pre-treat for contrast exposure.

## 2018-03-19 ENCOUNTER — Encounter (HOSPITAL_COMMUNITY): Payer: Self-pay | Admitting: General Practice

## 2018-03-19 ENCOUNTER — Inpatient Hospital Stay (HOSPITAL_COMMUNITY)
Admission: RE | Admit: 2018-03-19 | Discharge: 2018-03-29 | DRG: 229 | Disposition: A | Discharge status: Home / Self Care | Payer: Medicare Other | Attending: Thoracic Surgery (Cardiothoracic Vascular Surgery) | Admitting: Thoracic Surgery (Cardiothoracic Vascular Surgery)

## 2018-03-19 ENCOUNTER — Encounter (HOSPITAL_COMMUNITY)
Admission: RE | Disposition: A | Discharge status: Home / Self Care | Payer: Self-pay | Source: Home / Self Care | Attending: Thoracic Surgery (Cardiothoracic Vascular Surgery)

## 2018-03-19 ENCOUNTER — Other Ambulatory Visit: Payer: Self-pay

## 2018-03-19 DIAGNOSIS — I9751 Accidental puncture and laceration of a circulatory system organ or structure during a circulatory system procedure: Secondary | ICD-10-CM | POA: Diagnosis not present

## 2018-03-19 DIAGNOSIS — D696 Thrombocytopenia, unspecified: Secondary | ICD-10-CM | POA: Diagnosis not present

## 2018-03-19 DIAGNOSIS — Z7982 Long term (current) use of aspirin: Secondary | ICD-10-CM

## 2018-03-19 DIAGNOSIS — K5900 Constipation, unspecified: Secondary | ICD-10-CM | POA: Diagnosis not present

## 2018-03-19 DIAGNOSIS — Z9689 Presence of other specified functional implants: Secondary | ICD-10-CM

## 2018-03-19 DIAGNOSIS — Z91013 Allergy to seafood: Secondary | ICD-10-CM | POA: Diagnosis not present

## 2018-03-19 DIAGNOSIS — Z4682 Encounter for fitting and adjustment of non-vascular catheter: Secondary | ICD-10-CM

## 2018-03-19 DIAGNOSIS — K219 Gastro-esophageal reflux disease without esophagitis: Secondary | ICD-10-CM | POA: Diagnosis present

## 2018-03-19 DIAGNOSIS — Z91030 Bee allergy status: Secondary | ICD-10-CM | POA: Diagnosis not present

## 2018-03-19 DIAGNOSIS — K449 Diaphragmatic hernia without obstruction or gangrene: Secondary | ICD-10-CM | POA: Diagnosis not present

## 2018-03-19 DIAGNOSIS — R911 Solitary pulmonary nodule: Secondary | ICD-10-CM | POA: Diagnosis present

## 2018-03-19 DIAGNOSIS — Y658 Other specified misadventures during surgical and medical care: Secondary | ICD-10-CM | POA: Diagnosis not present

## 2018-03-19 DIAGNOSIS — I340 Nonrheumatic mitral (valve) insufficiency: Secondary | ICD-10-CM | POA: Diagnosis not present

## 2018-03-19 DIAGNOSIS — Z885 Allergy status to narcotic agent status: Secondary | ICD-10-CM | POA: Diagnosis not present

## 2018-03-19 DIAGNOSIS — Z881 Allergy status to other antibiotic agents status: Secondary | ICD-10-CM | POA: Diagnosis not present

## 2018-03-19 DIAGNOSIS — Z7951 Long term (current) use of inhaled steroids: Secondary | ICD-10-CM

## 2018-03-19 DIAGNOSIS — I371 Nonrheumatic pulmonary valve insufficiency: Secondary | ICD-10-CM | POA: Diagnosis present

## 2018-03-19 DIAGNOSIS — J810 Acute pulmonary edema: Secondary | ICD-10-CM | POA: Diagnosis not present

## 2018-03-19 DIAGNOSIS — Y713 Surgical instruments, materials and cardiovascular devices (including sutures) associated with adverse incidents: Secondary | ICD-10-CM | POA: Diagnosis not present

## 2018-03-19 DIAGNOSIS — Z452 Encounter for adjustment and management of vascular access device: Secondary | ICD-10-CM | POA: Diagnosis not present

## 2018-03-19 DIAGNOSIS — H409 Unspecified glaucoma: Secondary | ICD-10-CM | POA: Diagnosis present

## 2018-03-19 DIAGNOSIS — Z800 Family history of malignant neoplasm of digestive organs: Secondary | ICD-10-CM

## 2018-03-19 DIAGNOSIS — D62 Acute posthemorrhagic anemia: Secondary | ICD-10-CM | POA: Diagnosis not present

## 2018-03-19 DIAGNOSIS — R0989 Other specified symptoms and signs involving the circulatory and respiratory systems: Secondary | ICD-10-CM | POA: Diagnosis not present

## 2018-03-19 DIAGNOSIS — J90 Pleural effusion, not elsewhere classified: Secondary | ICD-10-CM | POA: Diagnosis not present

## 2018-03-19 DIAGNOSIS — I700 Atherosclerosis of aorta: Secondary | ICD-10-CM | POA: Diagnosis present

## 2018-03-19 DIAGNOSIS — Z8673 Personal history of transient ischemic attack (TIA), and cerebral infarction without residual deficits: Secondary | ICD-10-CM | POA: Diagnosis not present

## 2018-03-19 DIAGNOSIS — Z01810 Encounter for preprocedural cardiovascular examination: Secondary | ICD-10-CM | POA: Diagnosis not present

## 2018-03-19 DIAGNOSIS — D151 Benign neoplasm of heart: Secondary | ICD-10-CM | POA: Diagnosis not present

## 2018-03-19 DIAGNOSIS — I350 Nonrheumatic aortic (valve) stenosis: Secondary | ICD-10-CM | POA: Diagnosis not present

## 2018-03-19 DIAGNOSIS — D219 Benign neoplasm of connective and other soft tissue, unspecified: Secondary | ICD-10-CM | POA: Diagnosis not present

## 2018-03-19 DIAGNOSIS — J9811 Atelectasis: Secondary | ICD-10-CM

## 2018-03-19 DIAGNOSIS — J939 Pneumothorax, unspecified: Secondary | ICD-10-CM | POA: Diagnosis not present

## 2018-03-19 DIAGNOSIS — E785 Hyperlipidemia, unspecified: Secondary | ICD-10-CM | POA: Diagnosis present

## 2018-03-19 DIAGNOSIS — D649 Anemia, unspecified: Secondary | ICD-10-CM | POA: Diagnosis not present

## 2018-03-19 DIAGNOSIS — J8410 Pulmonary fibrosis, unspecified: Secondary | ICD-10-CM | POA: Diagnosis present

## 2018-03-19 DIAGNOSIS — Z9889 Other specified postprocedural states: Secondary | ICD-10-CM

## 2018-03-19 HISTORY — DX: Personal history of other diseases of the digestive system: Z87.19

## 2018-03-19 HISTORY — DX: Solitary pulmonary nodule: R91.1

## 2018-03-19 HISTORY — DX: Benign neoplasm of heart: D15.1

## 2018-03-19 HISTORY — PX: RIGHT/LEFT HEART CATH AND CORONARY ANGIOGRAPHY: CATH118266

## 2018-03-19 LAB — POCT I-STAT 7, (LYTES, BLD GAS, ICA,H+H)
Acid-base deficit: 1 mmol/L (ref 0.0–2.0)
Bicarbonate: 23.8 mmol/L (ref 20.0–28.0)
Calcium, Ion: 1.21 mmol/L (ref 1.15–1.40)
HCT: 37 % (ref 36.0–46.0)
Hemoglobin: 12.6 g/dL (ref 12.0–15.0)
O2 Saturation: 99 %
Potassium: 3.7 mmol/L (ref 3.5–5.1)
SODIUM: 141 mmol/L (ref 135–145)
TCO2: 25 mmol/L (ref 22–32)
pCO2 arterial: 39.4 mmHg (ref 32.0–48.0)
pH, Arterial: 7.388 (ref 7.350–7.450)
pO2, Arterial: 142 mmHg — ABNORMAL HIGH (ref 83.0–108.0)

## 2018-03-19 LAB — POCT I-STAT EG7
Acid-base deficit: 1 mmol/L (ref 0.0–2.0)
Bicarbonate: 24 mmol/L (ref 20.0–28.0)
Calcium, Ion: 1.22 mmol/L (ref 1.15–1.40)
HCT: 38 % (ref 36.0–46.0)
Hemoglobin: 12.9 g/dL (ref 12.0–15.0)
O2 Saturation: 77 %
POTASSIUM: 3.7 mmol/L (ref 3.5–5.1)
Sodium: 141 mmol/L (ref 135–145)
TCO2: 25 mmol/L (ref 22–32)
pCO2, Ven: 40.6 mmHg — ABNORMAL LOW (ref 44.0–60.0)
pH, Ven: 7.379 (ref 7.250–7.430)
pO2, Ven: 42 mmHg (ref 32.0–45.0)

## 2018-03-19 LAB — URINALYSIS, COMPLETE (UACMP) WITH MICROSCOPIC
Bacteria, UA: NONE SEEN
Bilirubin Urine: NEGATIVE
Glucose, UA: NEGATIVE mg/dL
Hgb urine dipstick: NEGATIVE
Ketones, ur: 5 mg/dL — AB
Leukocytes,Ua: NEGATIVE
Nitrite: NEGATIVE
Protein, ur: NEGATIVE mg/dL
Specific Gravity, Urine: 1.031 — ABNORMAL HIGH (ref 1.005–1.030)
pH: 6 (ref 5.0–8.0)

## 2018-03-19 SURGERY — RIGHT/LEFT HEART CATH AND CORONARY ANGIOGRAPHY
Anesthesia: LOCAL

## 2018-03-19 MED ORDER — SODIUM CHLORIDE 0.9 % IV SOLN
INTRAVENOUS | Status: AC
  Administered 2018-03-19: 17:00:00 via INTRAVENOUS

## 2018-03-19 MED ORDER — ASPIRIN 81 MG PO CHEW
81.0000 mg | CHEWABLE_TABLET | ORAL | Status: AC
  Administered 2018-03-19: 81 mg via ORAL

## 2018-03-19 MED ORDER — LIDOCAINE HCL (PF) 1 % IJ SOLN
INTRAMUSCULAR | Status: AC
  Filled 2018-03-19: qty 30

## 2018-03-19 MED ORDER — SODIUM CHLORIDE 0.9 % IV SOLN: 250.0000 mL | INTRAVENOUS | Status: DC | PRN

## 2018-03-19 MED ORDER — VITAMIN D 25 MCG (1000 UNIT) PO TABS
1000.0000 [IU] | ORAL_TABLET | Freq: Every day | ORAL | Status: DC
  Administered 2018-03-20: 1000 [IU] via ORAL
  Filled 2018-03-19 (×2): qty 3

## 2018-03-19 MED ORDER — PSYLLIUM 95 % PO PACK
1.0000 | PACK | Freq: Every day | ORAL | Status: DC
  Administered 2018-03-20: 1 via ORAL
  Filled 2018-03-19: qty 1

## 2018-03-19 MED ORDER — FENTANYL CITRATE (PF) 100 MCG/2ML IJ SOLN
INTRAMUSCULAR | Status: AC
  Filled 2018-03-19: qty 2

## 2018-03-19 MED ORDER — ASPIRIN 81 MG PO CHEW
CHEWABLE_TABLET | ORAL | Status: AC
  Administered 2018-03-19: 81 mg via ORAL
  Filled 2018-03-19: qty 1

## 2018-03-19 MED ORDER — DOCUSATE SODIUM 100 MG PO CAPS
100.0000 mg | ORAL_CAPSULE | Freq: Every day | ORAL | Status: DC
  Administered 2018-03-19 – 2018-03-20 (×2): 100 mg via ORAL
  Filled 2018-03-19 (×2): qty 1

## 2018-03-19 MED ORDER — VERAPAMIL HCL 2.5 MG/ML IV SOLN
INTRAVENOUS | Status: DC | PRN
  Administered 2018-03-19: 10 mL via INTRA_ARTERIAL

## 2018-03-19 MED ORDER — PSYLLIUM 0.52 G PO CAPS: 0.5200 g | ORAL_CAPSULE | Freq: Every day | ORAL | Status: DC

## 2018-03-19 MED ORDER — ACETAMINOPHEN 325 MG PO TABS: 650.0000 mg | ORAL_TABLET | ORAL | Status: DC | PRN

## 2018-03-19 MED ORDER — ALBUTEROL SULFATE (2.5 MG/3ML) 0.083% IN NEBU: 3.0000 mL | INHALATION_SOLUTION | Freq: Four times a day (QID) | RESPIRATORY_TRACT | Status: DC | PRN

## 2018-03-19 MED ORDER — FLUTICASONE FUROATE-VILANTEROL 200-25 MCG/INH IN AEPB: 1.0000 | INHALATION_SPRAY | Freq: Every day | RESPIRATORY_TRACT | Status: DC

## 2018-03-19 MED ORDER — CALCIUM CARBONATE-VITAMIN D 500-125 MG-UNIT PO TABS: 1.0000 | ORAL_TABLET | Freq: Every day | ORAL | Status: DC

## 2018-03-19 MED ORDER — SODIUM CHLORIDE 0.9 % WEIGHT BASED INFUSION
3.0000 mL/kg/h | INTRAVENOUS | Status: DC
  Administered 2018-03-19: 3 mL/kg/h via INTRAVENOUS

## 2018-03-19 MED ORDER — FLUTICASONE FUROATE-VILANTEROL 200-25 MCG/INH IN AEPB
1.0000 | INHALATION_SPRAY | Freq: Every day | RESPIRATORY_TRACT | Status: DC
  Administered 2018-03-20: 1 via RESPIRATORY_TRACT
  Filled 2018-03-19: qty 28

## 2018-03-19 MED ORDER — SODIUM CHLORIDE 0.9% FLUSH: 3.0000 mL | Freq: Two times a day (BID) | INTRAVENOUS | Status: DC

## 2018-03-19 MED ORDER — CALCIUM CARBONATE-VITAMIN D 500-200 MG-UNIT PO TABS: 1.0000 | ORAL_TABLET | Freq: Every day | ORAL | Status: DC

## 2018-03-19 MED ORDER — ASPIRIN EC 81 MG PO TBEC
81.0000 mg | DELAYED_RELEASE_TABLET | Freq: Every day | ORAL | Status: DC
  Administered 2018-03-20: 81 mg via ORAL
  Filled 2018-03-19: qty 1

## 2018-03-19 MED ORDER — SODIUM CHLORIDE 0.9% FLUSH: 3.0000 mL | INTRAVENOUS | Status: DC | PRN

## 2018-03-19 MED ORDER — SODIUM CHLORIDE 0.9% FLUSH
3.0000 mL | INTRAVENOUS | Status: DC | PRN
  Administered 2018-03-20: 3 mL via INTRAVENOUS
  Filled 2018-03-19: qty 3

## 2018-03-19 MED ORDER — HEPARIN (PORCINE) IN NACL 1000-0.9 UT/500ML-% IV SOLN
INTRAVENOUS | Status: AC
  Filled 2018-03-19: qty 1000

## 2018-03-19 MED ORDER — MIDAZOLAM HCL 2 MG/2ML IJ SOLN
INTRAMUSCULAR | Status: DC | PRN
  Administered 2018-03-19: 1 mg via INTRAVENOUS

## 2018-03-19 MED ORDER — HEPARIN (PORCINE) IN NACL 1000-0.9 UT/500ML-% IV SOLN
INTRAVENOUS | Status: DC | PRN
  Administered 2018-03-19 (×2): 500 mL

## 2018-03-19 MED ORDER — IOHEXOL 350 MG/ML SOLN
INTRAVENOUS | Status: DC | PRN
  Administered 2018-03-19: 65 mL via INTRA_ARTERIAL

## 2018-03-19 MED ORDER — PANTOPRAZOLE SODIUM 40 MG PO TBEC
40.0000 mg | DELAYED_RELEASE_TABLET | Freq: Every day | ORAL | Status: DC
  Administered 2018-03-20: 40 mg via ORAL
  Filled 2018-03-19: qty 2

## 2018-03-19 MED ORDER — ATORVASTATIN CALCIUM 10 MG PO TABS
20.0000 mg | ORAL_TABLET | Freq: Every day | ORAL | Status: DC
  Administered 2018-03-19 – 2018-03-20 (×2): 20 mg via ORAL
  Filled 2018-03-19 (×2): qty 2

## 2018-03-19 MED ORDER — SODIUM CHLORIDE 0.9% FLUSH
3.0000 mL | Freq: Two times a day (BID) | INTRAVENOUS | Status: DC
  Administered 2018-03-20: 3 mL via INTRAVENOUS

## 2018-03-19 MED ORDER — HEPARIN SODIUM (PORCINE) 1000 UNIT/ML IJ SOLN
INTRAMUSCULAR | Status: DC | PRN
  Administered 2018-03-19: 2500 [IU] via INTRAVENOUS

## 2018-03-19 MED ORDER — VERAPAMIL HCL 2.5 MG/ML IV SOLN
INTRAVENOUS | Status: AC
  Filled 2018-03-19: qty 2

## 2018-03-19 MED ORDER — BENZONATATE 100 MG PO CAPS: 200.0000 mg | ORAL_CAPSULE | Freq: Three times a day (TID) | ORAL | Status: DC | PRN

## 2018-03-19 MED ORDER — FERROUS SULFATE 325 (65 FE) MG PO TABS: 650.0000 mg | ORAL_TABLET | Freq: Every day | ORAL | Status: DC

## 2018-03-19 MED ORDER — ONDANSETRON HCL 4 MG/2ML IJ SOLN: 4.0000 mg | Freq: Four times a day (QID) | INTRAMUSCULAR | Status: DC | PRN

## 2018-03-19 MED ORDER — SODIUM CHLORIDE 0.9 % WEIGHT BASED INFUSION: 1.0000 mL/kg/h | INTRAVENOUS | Status: DC

## 2018-03-19 MED ORDER — HEPARIN SODIUM (PORCINE) 1000 UNIT/ML IJ SOLN
INTRAMUSCULAR | Status: AC
  Filled 2018-03-19: qty 1

## 2018-03-19 MED ORDER — LIDOCAINE HCL (PF) 1 % IJ SOLN
INTRAMUSCULAR | Status: DC | PRN
  Administered 2018-03-19 (×2): 2 mL via INTRADERMAL

## 2018-03-19 MED ORDER — MIDAZOLAM HCL 2 MG/2ML IJ SOLN
INTRAMUSCULAR | Status: AC
  Filled 2018-03-19: qty 2

## 2018-03-19 MED ORDER — VITAMIN D 1000 UNITS PO TABS: 1000.0000 [IU] | ORAL_TABLET | Freq: Every day | ORAL | Status: DC

## 2018-03-19 SURGICAL SUPPLY — 12 items
CATH BALLN WEDGE 5F 110CM (CATHETERS) ×1 IMPLANT
CATH INFINITI 5FR JK (CATHETERS) ×1 IMPLANT
DEVICE RAD COMP TR BAND LRG (VASCULAR PRODUCTS) ×1 IMPLANT
GLIDESHEATH SLEND SS 6F .021 (SHEATH) ×1 IMPLANT
GUIDEWIRE INQWIRE 1.5J.035X260 (WIRE) IMPLANT
INQWIRE 1.5J .035X260CM (WIRE) ×2
KIT HEART LEFT (KITS) ×2 IMPLANT
PACK CARDIAC CATHETERIZATION (CUSTOM PROCEDURE TRAY) ×2 IMPLANT
SHEATH GLIDE SLENDER 4/5FR (SHEATH) ×1 IMPLANT
SHEATH PROBE COVER 6X72 (BAG) ×1 IMPLANT
TRANSDUCER W/STOPCOCK (MISCELLANEOUS) ×2 IMPLANT
TUBING CIL FLEX 10 FLL-RA (TUBING) ×2 IMPLANT

## 2018-03-19 NOTE — Consult Note (Signed)
[address]       [place] [address]             [phone]          CARDIOTHORACIC SURGERY CONSULTATION REPORT  PCP is Baity, Wanda Keens, NP Referring Provider is Wanda Sacramento, MD Primary Cardiologist is No primary care provider on file.  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  Patient has been remarkably healthy and physically active all of her life.  She exercises on a daily basis.  She reports an intermittent persistent dry nonproductive cough off and on for the past year.  Several months ago she began to experience exertional shortness of breath.  Shortness of breath progressed to the point where she occasionally got breathless while talking.  She was evaluated by a pulmonologist and has been diagnosed with possible pulmonary fibrosis based on CT imaging.  She was recently started on an inhaler.  At the family's insistence an echocardiogram was performed to rule out congestive heart failure.  Echocardiogram revealed a large mass appearing to emanate from the intra-atrial septum consistent with likely atrial myxoma.  The patient was admitted to the hospital for diagnostic cardiac catheterization and cardiothoracic surgical consultation was requested.  Patient is married and lives with her husband at the [facility] community.  The patient's husband is not in good health and she serves as his primary caregiver.  The patient is quite active physically and walks several miles every day.  She participates in regular exercise classes.  She drives an automobile short distances.  She has not had any significant physical limitations until the recent development of symptoms of exertional  shortness of breath.  She denies any history of PND, orthopnea, or lower extremity edema.  She is not had any palpitations, dizzy spells, nor syncope.  Shortness of breath is not positional.  She denies any history of chest pain or chest tightness either with activity or at rest.  Appetite is good.  She has no history of fevers.  She reports no transient monocular blindness or transient visual disturbances.  She has not had any other neurologic symptoms and she has no residual neurologic findings from the stroke she suffered 30 years ago.  Past Medical History:  Diagnosis Date  . Anemia   . Glaucoma   . History of blood transfusion   . History of hiatal hernia   . Hyperlipidemia   . Stroke [facility])     Past Surgical History:  Procedure Laterality Date  . ABDOMINAL HYSTERECTOMY  1982   total  . BUNIONECTOMY Bilateral   . RECTOCELE REPAIR    . TONSILLECTOMY      Family History  Problem Relation Age of Onset  . Colon cancer Brother   . Colon cancer Maternal Uncle   . Colon cancer Maternal Grandmother     Social History   Socioeconomic History  . Marital status: Married    Spouse name: Not on file  . Number of children: Not on file  . Years of education: Not on file  . Highest education level: Not on file  Occupational History  . Not on file  Social Needs  . Financial resource strain: Not on file  . Food insecurity:  Worry: Not on file    Inability: Not on file  . Transportation needs:    Medical: Not on file    Non-medical: Not on file  Tobacco Use  . Smoking status: Never Smoker  . Smokeless tobacco: Never Used  Substance and Sexual Activity  . Alcohol use: No    Alcohol/week: 0.0 standard drinks  . Drug use: No  . Sexual activity: Not Currently  Lifestyle  . Physical activity:    Days per week: Not on file    Minutes per session: Not on file  . Stress: Not on file  Relationships  . Social connections:    Talks on phone: Not on file    Gets together: Not on  file    Attends religious service: Not on file    Active member of club or organization: Not on file    Attends meetings of clubs or organizations: Not on file    Relationship status: Not on file  . Intimate partner violence:    Fear of current or ex partner: Not on file    Emotionally abused: Not on file    Physically abused: Not on file    Forced sexual activity: Not on file  Other Topics Concern  . Not on file  Social History Narrative   [place]    Prior to Admission medications   Medication Sig Start Date End Date Taking? Authorizing Provider  albuterol (PROVENTIL HFA;VENTOLIN HFA) 108 (90 Base) MCG/ACT inhaler Inhale 1-2 puffs into the lungs every 6 (six) hours as needed for wheezing or shortness of breath. 02/14/18  Yes McLean-Scocuzza, Wanda Glow, MD  aspirin 81 MG tablet Take 81 mg by mouth daily.   Yes [provider]  atorvastatin (LIPITOR) 20 MG tablet Take 1 tablet (20 mg total) by mouth daily. 12/11/17  Yes Baity, Wanda Keens, NP  benzonatate (TESSALON) 200 MG capsule Take 1 capsule (200 mg total) by mouth 3 (three) times daily as needed for cough. 12/11/17  Yes Baity, Wanda Keens, NP  Calcium Carbonate-Vitamin D (CALCIUM 500 + D) 500-125 MG-UNIT TABS Take 1 tablet by mouth daily.   Yes [provider]  cholecalciferol (VITAMIN D) 1000 units tablet Take 1,000 Units by mouth daily.   Yes [provider]  Docusate Sodium (STOOL SOFTENER) 100 MG capsule Take 100 mg by mouth daily.   Yes [provider]  ferrous sulfate (FEROSUL) 325 (65 FE) MG tablet Take 2 tablets (650 mg total) by mouth daily with breakfast. MUST SCHEDULE ANNUAL PHYSICAL 12/11/17  Yes Baity, Wanda Keens, NP  Flaxseed, Linseed, (FLAX SEED OIL) 1300 MG CAPS Take 1,100 mg by mouth daily.    Yes [provider]  fluticasone furoate-vilanterol (BREO ELLIPTA) 200-25 MCG/INH AEPB Inhale 1 puff into the lungs daily. Rinse mouth after use. 02/25/18  Yes Laverle Hobby, MD   Multiple Vitamin (MULTIVITAMIN) tablet Take 1 tablet by mouth daily.   Yes [provider]  Omega-3 Fatty Acids (FISH OIL) 1200 MG CAPS Take 2,400 mg by mouth 2 (two) times daily.   Yes [provider]  omeprazole (PRILOSEC) 20 MG capsule Take 1 capsule (20 mg total) by mouth daily. 02/25/18  Yes Laverle Hobby, MD  psyllium (REGULOID) 0.52 g capsule Take 0.52 g by mouth daily.   Yes [provider]  Spacer/Aero-Holding Chambers DEVI 1 Device by Does not apply route as needed. 02/18/18   Jearld Fenton, NP    Current Facility-Administered Medications  Medication Dose Route Frequency  Provider Last Rate Last Dose  . 0.9 %  sodium chloride infusion   Intravenous Continuous Wellington Hampshire, MD 75 mL/hr at 03/19/18 1641    . 0.9 %  sodium chloride infusion  250 mL Intravenous PRN Wellington Hampshire, MD      . acetaminophen (TYLENOL) tablet 650 mg  650 mg Oral Q4H PRN [facility] A, MD      . albuterol (PROVENTIL) (2.5 MG/3ML) 0.083% nebulizer solution 3 mL  3 mL Inhalation Q6H PRN Wellington Hampshire, MD      . Derrill Memo ON 03/20/2018] aspirin EC tablet 81 mg  81 mg Oral Daily Arida, Muhammad A, MD      . atorvastatin (LIPITOR) tablet 20 mg  20 mg Oral Daily [facility] A, MD   20 mg at 03/19/18 1641  . benzonatate (TESSALON) capsule 200 mg  200 mg Oral TID PRN Wellington Hampshire, MD      . Derrill Memo ON 03/20/2018] calcium-vitamin D (OSCAL WITH D) 500-200 MG-UNIT per tablet 1 tablet  1 tablet Oral Q breakfast Wellington Hampshire, MD      . Derrill Memo ON 03/20/2018] cholecalciferol (VITAMIN D3) tablet 1,000 Units  1,000 Units Oral Daily [facility] A, MD      . docusate sodium (COLACE) capsule 100 mg  100 mg Oral Daily [facility] A, MD   100 mg at 03/19/18 1641  . [START ON 03/20/2018] ferrous sulfate tablet 650 mg  650 mg Oral Q breakfast Wellington Hampshire, MD      . Derrill Memo ON 03/20/2018] fluticasone furoate-vilanterol (BREO ELLIPTA) 200-25 MCG/INH 1 puff  1 puff  Inhalation Daily [facility] A, MD      . ondansetron (ZOFRAN) injection 4 mg  4 mg Intravenous Q6H PRN Wellington Hampshire, MD      . Derrill Memo ON 03/20/2018] pantoprazole (PROTONIX) EC tablet 40 mg  40 mg Oral Daily Wellington Hampshire, MD      . Derrill Memo ON 03/20/2018] psyllium (HYDROCIL/METAMUCIL) packet 1 packet  1 packet Oral Daily Arida, Muhammad A, MD      . sodium chloride flush (NS) 0.9 % injection 3 mL  3 mL Intravenous Q12H Arida, Muhammad A, MD      . sodium chloride flush (NS) 0.9 % injection 3 mL  3 mL Intravenous PRN Wellington Hampshire, MD        Allergies  Allergen Reactions  . Bee Venom Anaphylaxis and Hives  . Codeine Other (See Comments)    Altered mental status Altered mental status  . Morphine And Related     Altered mental status  . Shellfish Allergy Anaphylaxis and Hives    Mainly shrimp  . Nitrofurantoin Rash      Review of Systems:   General:  normal appetite, normal energy, no weight gain, slight purposeful weight loss, no fever  Cardiac:  no chest pain with exertion, no chest pain at rest, + SOB with exertion, no resting SOB, no PND, no orthopnea, no palpitations, no arrhythmia, no atrial fibrillation, no LE edema, no dizzy spells, no syncope  Respiratory:  + shortness of breath, no home oxygen, no productive cough, + dry cough, NO bronchitis, NO wheezing, NO hemoptysis, no asthma, no pain with inspiration or cough, no sleep apnea, no CPAP at night  GI:   no difficulty swallowing, no reflux, no frequent heartburn, + hiatal hernia, no abdominal pain, no constipation, no diarrhea, no hematochezia, no hematemesis, no melena  GU:   no dysuria,  no frequency, no urinary tract infection, no hematuria, no kidney stones, no kidney disease  Vascular:  no pain suggestive of claudication, no pain in feet, no leg cramps, no varicose veins, no DVT, no non-healing foot ulcer  Neuro:   + remote h/o hemorrhagic stroke w/out residual, no TIA's, no seizures, no headaches, no  temporary blindness one eye,  no slurred speech, no peripheral neuropathy, no chronic pain, no instability of gait, no memory/cognitive dysfunction  Musculoskeletal: no arthritis , no joint swelling, no myalgias, no difficulty walking, normal mobility   Skin:   no rash, no itching, no skin infections, no pressure sores or ulcerations  Psych:   no anxiety, no depression, no nervousness, no unusual recent stress  Eyes:   no blurry vision, no floaters, no recent vision changes, + wears glasses or contacts  ENT:   no hearing loss, no loose or painful teeth, no dentures, last saw dentist within the past year  Hematologic:  no easy bruising, no abnormal bleeding, no clotting disorder, no frequent epistaxis  Endocrine:  no diabetes, does not check CBG's at home     Physical Exam:   BP 138/80   Pulse 78   Temp 97.7 F (36.5 C) (Oral)   Resp 18   Ht 5\' 2"  (1.575 m)   Wt 55.3 kg   SpO2 98%   BMI 22.31 kg/m   General:  Thin, elderly,  well-appearing  HEENT:  Unremarkable   Neck:   no JVD, no bruits, no adenopathy   Chest:   clear to auscultation, symmetrical breath sounds, no wheezes, no rhonchi   CV:   RRR, no  murmur   Abdomen:  soft, non-tender, no masses   Extremities:  warm, well-perfused, pulses palpable, no lower extremity edema  Rectal/GU  Deferred  Neuro:   Grossly non-focal and symmetrical throughout  Skin:   Clean and dry, no rashes, no breakdown  Diagnostic Tests:  Lab Results: Recent Labs    03/17/18 1127  WBC 8.0  HGB 14.6  HCT 44.5  PLT 209   BMET:  Recent Labs    03/17/18 1127  NA 140  K 3.8  CL 106  CO2 25  GLUCOSE 115*  BUN 18  CREATININE 0.78  CALCIUM 9.5    CBG (last 3)  No results for input(s): GLUCAP in the last 72 hours. PT/INR:  No results for input(s): LABPROT, INR in the last 72 hours.  CXR:  CHEST - 2 VIEW  COMPARISON:  05/11/2017  FINDINGS: Cardiac silhouette is normal in size. Moderate to large hiatal hernia. No mediastinal or  hilar masses. No convincing adenopathy.  Lungs show irregularly thickened bronchovascular markings as well as interstitial thickening in a heterogeneous distribution, unchanged from the prior exam. No evidence of pneumonia or pulmonary edema. No pleural effusion or pneumothorax.  Skeletal structures are demineralized, but intact.  IMPRESSION: 1. No acute cardiopulmonary disease. 2. Chronic lung findings including heterogeneous irregular interstitial thickening suggesting interstitial lung disease.   Electronically Signed   By: Lajean Manes M.D.   On: 02/14/2018 13:30    ECHOCARDIOGRAM REPORT    Patient Name:   Wanda [facility] Date of Exam: 03/17/2018 Medical Rec #:  [id number]        Height:       64.0 in Accession #:    [id number]       Weight:       127.8 lb Date of Birth:  1931-11-01        BSA:  1.62 m Patient Age:    73 years         BP:           112/64 mmHg Patient Gender: F                HR:           79 bpm. Exam Location:  [facility]    Procedure: 2D Echo, Cardiac Doppler and Color Doppler  Indications:     R06.00 Dyspnea   History:         Patient has no prior history of Echocardiogram examinations.                  Stroke; Risk Factors: Dyslipidemia.   Sonographer:     Charmayne Sheer RDCS (AE) Referring Phys:  [place] Diagnosing Phys: Wanda Sacramento MD  IMPRESSIONS    1. The left ventricle has normal systolic function, with an ejection fraction of 55-60%. The cavity size was normal. There is concentric left ventricular hypertrophy. Left ventricular diastolic Doppler parameters are consistent with impaired relaxation.  2. The right ventricle has normal systolic function. The cavity was normal. There is no increase in right ventricular wall thickness.  3. Large ( 2.4 X 2.5 cm ) mobile left atrial mass attached to the fossa ovalis/atrial septum. The mass is pedunculated in shape and solid in texture. The left atrial mass is  suggestive of a myxoma. It protrudes into the mitral inflow.  4. The mitral valve is normal in structure.  5. The tricuspid valve is normal in structure.  6. The pulmonic valve was normal in structure. Pulmonic valve regurgitation is mild by color flow Doppler.  7. The aortic valve is tricuspid.  FINDINGS  Left Ventricle: The left ventricle has normal systolic function, with an ejection fraction of 55-60%. The cavity size was normal. There is concentric left ventricular hypertrophy. Left ventricular diastolic Doppler parameters are consistent with  impaired relaxation Right Ventricle: The right ventricle has normal systolic function. The cavity was normal. There is no increase in right ventricular wall thickness. Left Atrium: left atrial size was normal in size There is a large mobile left atrial mass seen attached to the fossa ovalis/atrial septum. The mass is pedunculated in shape and solid in texture. The left atrial mass is suggestive of a myxoma. Right Atrium: right atrial size was normal in size. Right atrial pressure is estimated at 10 mmHg. Interatrial Septum: No atrial level shunt detected by color flow Doppler. Pericardium: There is no evidence of pericardial effusion. Mitral Valve: The mitral valve is normal in structure. Mitral valve regurgitation is mild by color flow Doppler. Tricuspid Valve: The tricuspid valve is normal in structure. Tricuspid valve regurgitation was not visualized by color flow Doppler. Aortic Valve: The aortic valve is tricuspid Aortic valve regurgitation was not visualized by color flow Doppler. Pulmonic Valve: The pulmonic valve was normal in structure. Pulmonic valve regurgitation is mild by color flow Doppler. Venous: The inferior vena cava is normal in size with greater than 50% respiratory variability.   LEFT VENTRICLE PLAX 2D (Teich) LV EF:          66.7 %   Diastology LVIDd:          4.11 cm  LV e' lateral:   7.07 cm/s LVIDs:          2.61 cm  LV  E/e' lateral: 12.2 LV PW:          0.83 cm  LV e' medial:    4.68 cm/s LV IVS:         0.88 cm  LV E/e' medial:  18.4 LVOT diam:      1.90 cm LV SV:          50 ml LVOT Area:      2.84 cm  LEFT ATRIUM             Index       RIGHT ATRIUM           Index LA diam:        2.10 cm 1.30 cm/m  RA Pressure: 10 mmHg LA Vol (A2C):   41.9 ml 25.91 ml/m RA Area:     11.70 cm LA Vol (A4C):   35.0 ml 21.64 ml/m RA Volume:   23.60 ml  14.59 ml/m LA Biplane Vol: 42.6 ml 26.34 ml/m  AORTIC VALVE                   PULMONIC VALVE AV Area (Vmax):    1.73 cm    PV Vmax:       0.90 m/s AV Area (Vmean):   1.99 cm    PV Vmean:      66.300 cm/s AV Area (VTI):     1.79 cm    PV VTI:        0.187 m AV Vmax:           132.00 cm/s PV Peak grad:  3.3 mmHg AV Vmean:          85.100 cm/s PV Mean grad:  2.0 mmHg AV VTI:            0.242 m AV Peak Grad:      7.0 mmHg AV Mean Grad:      3.0 mmHg LVOT Vmax:         80.50 cm/s LVOT Vmean:        59.600 cm/s LVOT VTI:          0.153 m LVOT/AV VTI ratio: 0.63   AORTA Ao Root diam: 3.10 cm  MITRAL VALVE MV Area (PHT): 6.65 cm MV Peak grad:  6.7 mmHg MV Mean grad:  3.0 mmHg MV Vmax:       1.29 m/s MV Vmean:      86.1 cm/s MV VTI:        0.24 m MV PHT:        33.06 msec MV Decel Time: 114 msec MV E velocity: 86.30 cm/s MV A velocity: 132.00 cm/s MV E/A ratio:  0.65    Wanda Sacramento MD Electronically signed by Wanda Sacramento MD Signature Date/Time: 03/17/2018/11:12:54 AM     RIGHT/LEFT HEART CATH AND CORONARY ANGIOGRAPHY  Conclusion   1.  Normal coronary arteries. 2.  Right heart catheterization showed normal filling pressures, normal pulmonary pressure and normal cardiac output. 3.  Left ventricular angiography was not performed.  EF was normal by echo.  Recommendations: The patient needs urgent surgical evaluation for resection of left atrial mass likely myxoma.  The mass is mobile and high risk for embolization. I consulted CVTS.    Indications   Atrial myxoma [D15.1 (ICD-10-CM)]  Procedural Details   Technical Details Procedural Details: The pre-existing IV in the right antecubital vein was exchanged under sterile fashion to a slender sheath. The right wrist was prepped, draped, and anesthetized with 1% lidocaine. Using the modified Seldinger technique, a 5 French sheath was introduced into the right radial artery. 3 mg  of verapamil was administered through the sheath, weight-based unfractionated heparin was administered intravenously. Right heart catheterization was performed using a 5 French Swan-Ganz catheter. Cardiac output was calculated by the Fick method. A Jackie catheter was used for selective coronary angiography and LV pressure. There were no immediate procedural complications. A TR band was used for radial hemostasis at the completion of the procedure. The patient was transferred to the post catheterization recovery area for further monitoring.  Estimated blood loss <50 mL.   During this procedure medications were administered to achieve and maintain moderate conscious sedation while the patient's heart rate, blood pressure, and oxygen saturation were continuously monitored and I was present face-to-face 100% of this time.  Medications  (Filter: Administrations occurring from 03/19/18 1336 to 03/19/18 1439)  Medication Rate/Dose/Volume Action  Date Time   midazolam (VERSED) injection (mg) 1 mg Given 03/19/18 1353   Total dose as of 03/19/18 1728        1 mg        lidocaine (PF) (XYLOCAINE) 1 % injection (mL) 2 mL Given 03/19/18 1403   Total dose as of 03/19/18 1728 2 mL Given 1403   4 mL        Radial Cocktail/Verapamil only (mL) 10 mL Given 03/19/18 1408   Total dose as of 03/19/18 1728        10 mL        heparin injection (Units) 2,500 Units Given 03/19/18 1416   Total dose as of 03/19/18 1728        2,500 Units        iohexol (OMNIPAQUE) 350 MG/ML injection (mL) 65 mL Given 03/19/18 1428   Total dose  as of 03/19/18 1728        65 mL        Heparin (Porcine) in NaCl 1000-0.9 UT/500ML-% SOLN (mL) 500 mL Given 03/19/18 1428   Total dose as of 03/19/18 1728 500 mL Given 1428   1,000 mL        Sedation Time   Sedation Time Physician-1: 28 minutes 29 seconds  Coronary Findings   Diagnostic  Dominance: Right  Left Main  Vessel is angiographically normal.  Left Anterior Descending  Vessel is angiographically normal.  First Diagonal Branch  Vessel is angiographically normal.  Second Diagonal Branch  Vessel is angiographically normal.  Third Diagonal Branch  Vessel is angiographically normal.  Left Circumflex  Vessel is angiographically normal.  First Obtuse Marginal Branch  Second Obtuse Marginal Branch  Vessel is angiographically normal.  Third Obtuse Marginal Branch  Vessel is angiographically normal.  Right Coronary Artery  Vessel is angiographically normal.  Intervention   No interventions have been documented.  Coronary Diagrams   Diagnostic  Dominance: Right    Intervention   Implants    No implant documentation for this case.  Syngo Images   Show images for CARDIAC CATHETERIZATION  MERGE Images   Show images for CARDIAC CATHETERIZATION   Link to Procedure Log   Procedure Log    Hemo Data    Most Recent Value  Fick Cardiac Output 4.71 L/min  Fick Cardiac Output Index 3.04 (L/min)/BSA  RA A Wave 3 mmHg  RA V Wave 1 mmHg  RA Mean 0 mmHg  RV Systolic Pressure 25 mmHg  RV Diastolic Pressure -1 mmHg  RV EDP 2 mmHg  PA Systolic Pressure 28 mmHg  PA Diastolic Pressure 5 mmHg  PA Mean 16 mmHg  PW A Wave 12 mmHg  PW V  Wave 12 mmHg  PW Mean 10 mmHg  AO Systolic Pressure 287 mmHg  AO Diastolic Pressure 60 mmHg  AO Mean 93 mmHg  LV Systolic Pressure 681 mmHg  LV Diastolic Pressure 1 mmHg  LV EDP 7 mmHg  AOp Systolic Pressure 157 mmHg  AOp Diastolic Pressure 62 mmHg  AOp Mean Pressure 93 mmHg  LVp Systolic Pressure 262 mmHg  LVp Diastolic  Pressure 1 mmHg  LVp EDP Pressure 7 mmHg  QP/QS 1  TPVR Index 5.25 HRUI  TSVR Index 30.51 HRUI  TPVR/TSVR Ratio 0.17      Impression:  Patient presents with symptoms of exertional shortness of breath and persistent dry nonproductive cough.  I personally reviewed the patient's recent transthoracic echocardiogram and diagnostic cardiac catheterization.  She has a large left atrial mass with characteristics pathognomonic for atrial myxoma.  Diagnostic cardiac catheterization reveals normal coronary artery anatomy with no significant coronary artery disease.  Right heart pressures are normal.  She needs prompt elective surgical resection.  She appears to be a likely good candidate for minimally invasive approach for surgery.    Plan:  I have discussed the nature of the patient's clinical problem at length with the patient and her entire family at the bedside.  We discussed the indications, risk, and potential benefits of surgical resection.  They understand that there are no good alternatives to surgery and without surgery there is considerable risk of embolization and stroke much less worsening symptoms of congestive heart failure related to functional mitral stenosis.  We discussed the risks associated with surgery and expectations for the patient's postoperative convalescence.  Alternative surgical approaches have been discussed including a comparison between conventional sternotomy and minimally-invasive techniques.  The relative risks and benefits of each have been reviewed as they pertain to the patient's specific circumstances, and all of their questions have been addressed.  The patient understands and accepts all potential associated risks of surgery including but not limited to risk of death, stroke, myocardial infarction, congestive heart failure, respiratory failure, renal failure, bleeding requiring blood transfusion and/or reexploration, arrhythmia, heart block or bradycardia requiring  permanent pacemaker, pneumonia, pleural effusion, wound infection, pulmonary embolus or other thromboembolic complication, chronic pain or other delayed complications.  All questions answered.  We tentatively plan to proceed with surgery on Friday, March 21, 2018.  Patient will undergo CT angiography to evaluate the feasibility of peripheral cannulation for surgery.    I spent in excess of 120 minutes during the conduct of this hospital consultation and >50% of this time involved direct face-to-face encounter for counseling and/or coordination of the patient's care.    Valentina Gu. Roxy Manns, MD 03/19/2018 5:27 PM

## 2018-03-19 NOTE — Interval H&P Note (Signed)
History and Physical Interval Note:  03/19/2018 1:53 PM  Wanda Nelson  has presented today for surgery, with the diagnosis of shortness of breath.  The various methods of treatment have been discussed with the patient and family. After consideration of risks, benefits and other options for treatment, the patient has consented to  Procedure(s): RIGHT/LEFT HEART CATH AND CORONARY ANGIOGRAPHY (N/A) as a surgical intervention.  The patient's history has been reviewed, patient examined, no change in status, stable for surgery.  I have reviewed the patient's chart and labs.  Questions were answered to the patient's satisfaction.     [name]

## 2018-03-19 NOTE — Progress Notes (Signed)
Patient arrived from cath lab, no complaints or complications. Right radial site level 0, right brachial site level 0. Pulses +2. 3 cc's air removed with no complications. Will continue to monitor.

## 2018-03-19 NOTE — Plan of Care (Signed)
  Problem: Education: Goal: Knowledge of General Education information will improve Description Including pain rating scale, medication(s)/side effects and non-pharmacologic comfort measures Outcome: Progressing   Problem: Health Behavior/Discharge Planning: Goal: Ability to manage health-related needs will improve Outcome: Progressing   

## 2018-03-20 ENCOUNTER — Inpatient Hospital Stay (HOSPITAL_COMMUNITY): Payer: Medicare Other

## 2018-03-20 ENCOUNTER — Encounter (HOSPITAL_COMMUNITY): Payer: Self-pay | Admitting: Cardiovascular Disease

## 2018-03-20 DIAGNOSIS — R911 Solitary pulmonary nodule: Secondary | ICD-10-CM | POA: Diagnosis present

## 2018-03-20 DIAGNOSIS — Z01810 Encounter for preprocedural cardiovascular examination: Secondary | ICD-10-CM

## 2018-03-20 HISTORY — DX: Solitary pulmonary nodule: R91.1

## 2018-03-20 LAB — COMPREHENSIVE METABOLIC PANEL
ALT: 15 U/L (ref 0–44)
AST: 18 U/L (ref 15–41)
Albumin: 2.9 g/dL — ABNORMAL LOW (ref 3.5–5.0)
Alkaline Phosphatase: 70 U/L (ref 38–126)
Anion gap: 8 (ref 5–15)
BUN: 16 mg/dL (ref 8–23)
CO2: 23 mmol/L (ref 22–32)
CREATININE: 0.89 mg/dL (ref 0.44–1.00)
Calcium: 9.1 mg/dL (ref 8.9–10.3)
Chloride: 110 mmol/L (ref 98–111)
GFR calc non Af Amer: 60 mL/min — ABNORMAL LOW (ref >60)
Glucose, Bld: 85 mg/dL (ref 70–99)
Potassium: 4 mmol/L (ref 3.5–5.1)
SODIUM: 141 mmol/L (ref 135–145)
Total Bilirubin: 0.8 mg/dL (ref 0.3–1.2)
Total Protein: 6.7 g/dL (ref 6.5–8.1)

## 2018-03-20 LAB — HEMOGLOBIN A1C
Hgb A1c MFr Bld: 5.3 % (ref 4.8–5.6)
Mean Plasma Glucose: 105.41 mg/dL

## 2018-03-20 LAB — BLOOD GAS, ARTERIAL
Acid-base deficit: 0.3 mmol/L (ref 0.0–2.0)
Bicarbonate: 23.7 mmol/L (ref 20.0–28.0)
Drawn by: 23604
FIO2: 0.21
O2 Saturation: 95.9 %
Patient temperature: 98.6
pCO2 arterial: 37.3 mmHg (ref 32.0–48.0)
pH, Arterial: 7.419 (ref 7.350–7.450)
pO2, Arterial: 78.1 mmHg — ABNORMAL LOW (ref 83.0–108.0)

## 2018-03-20 LAB — CBC
HCT: 41.2 % (ref 36.0–46.0)
Hemoglobin: 13.3 g/dL (ref 12.0–15.0)
MCH: 30 pg (ref 26.0–34.0)
MCHC: 32.3 g/dL (ref 30.0–36.0)
MCV: 92.8 fL (ref 80.0–100.0)
Platelets: 186 10*3/uL (ref 150–400)
RBC: 4.44 MIL/uL (ref 3.87–5.11)
RDW: 14.3 % (ref 11.5–15.5)
WBC: 6.1 10*3/uL (ref 4.0–10.5)
nRBC: 0 % (ref 0.0–0.2)

## 2018-03-20 LAB — PREALBUMIN: Prealbumin: 20.7 mg/dL (ref 18–38)

## 2018-03-20 LAB — PROTIME-INR
INR: 1 (ref 0.8–1.2)
Prothrombin Time: 13.5 seconds (ref 11.4–15.2)

## 2018-03-20 LAB — APTT: aPTT: 29 seconds (ref 24–36)

## 2018-03-20 LAB — ABO/RH: ABO/RH(D): O POS

## 2018-03-20 MED ORDER — GLUTARALDEHYDE 0.625% SOAKING SOLUTION
TOPICAL | Status: AC
  Administered 2018-03-21: 1 via TOPICAL
  Filled 2018-03-20 (×3): qty 50

## 2018-03-20 MED ORDER — INSULIN REGULAR(HUMAN) IN NACL 100-0.9 UT/100ML-% IV SOLN
INTRAVENOUS | Status: AC
  Administered 2018-03-21: 1 [IU]/h via INTRAVENOUS
  Filled 2018-03-20: qty 100

## 2018-03-20 MED ORDER — KENNESTONE BLOOD CARDIOPLEGIA VIAL
13.0000 mL | Status: DC
  Filled 2018-03-20: qty 13

## 2018-03-20 MED ORDER — CALCIUM CARBONATE-VITAMIN D 500-200 MG-UNIT PO TABS
1.0000 | ORAL_TABLET | Freq: Every day | ORAL | Status: DC
  Administered 2018-03-20: 1 via ORAL
  Filled 2018-03-20: qty 1

## 2018-03-20 MED ORDER — POTASSIUM CHLORIDE 2 MEQ/ML IV SOLN
80.0000 meq | INTRAVENOUS | Status: DC
  Filled 2018-03-20: qty 40

## 2018-03-20 MED ORDER — KENNESTONE BLOOD CARDIOPLEGIA (KBC) MANNITOL SYRINGE (20%, 32ML)
32.0000 mL | INTRAVENOUS | Status: DC
  Filled 2018-03-20: qty 32

## 2018-03-20 MED ORDER — FERROUS SULFATE 325 (65 FE) MG PO TABS
650.0000 mg | ORAL_TABLET | Freq: Every day | ORAL | Status: DC
  Administered 2018-03-20: 650 mg via ORAL
  Filled 2018-03-20: qty 2

## 2018-03-20 MED ORDER — TRANEXAMIC ACID 1000 MG/10ML IV SOLN
1.5000 mg/kg/h | INTRAVENOUS | Status: AC
  Administered 2018-03-21: 1.5 mg/kg/h via INTRAVENOUS
  Filled 2018-03-20: qty 25

## 2018-03-20 MED ORDER — DEXMEDETOMIDINE HCL IN NACL 400 MCG/100ML IV SOLN
0.1000 ug/kg/h | INTRAVENOUS | Status: AC
  Administered 2018-03-21: .7 ug/kg/h via INTRAVENOUS
  Filled 2018-03-20: qty 100

## 2018-03-20 MED ORDER — MAGNESIUM SULFATE 50 % IJ SOLN
40.0000 meq | INTRAMUSCULAR | Status: DC
  Filled 2018-03-20: qty 9.85

## 2018-03-20 MED ORDER — NOREPINEPHRINE 4 MG/250ML-% IV SOLN
0.0000 ug/min | INTRAVENOUS | Status: DC
  Filled 2018-03-20: qty 250

## 2018-03-20 MED ORDER — TRANEXAMIC ACID (OHS) BOLUS VIA INFUSION
15.0000 mg/kg | INTRAVENOUS | Status: AC
  Administered 2018-03-21: 823.5 mg via INTRAVENOUS
  Filled 2018-03-20: qty 824

## 2018-03-20 MED ORDER — CHLORHEXIDINE GLUCONATE 4 % EX LIQD
60.0000 mL | Freq: Once | CUTANEOUS | Status: AC
  Administered 2018-03-21: 4 via TOPICAL

## 2018-03-20 MED ORDER — EPINEPHRINE PF 1 MG/ML IJ SOLN
0.0000 ug/min | INTRAVENOUS | Status: DC
  Filled 2018-03-20: qty 4

## 2018-03-20 MED ORDER — METOPROLOL TARTRATE 12.5 MG HALF TABLET
12.5000 mg | ORAL_TABLET | Freq: Once | ORAL | Status: AC
  Administered 2018-03-21: 12.5 mg via ORAL
  Filled 2018-03-20: qty 1

## 2018-03-20 MED ORDER — BISACODYL 5 MG PO TBEC
5.0000 mg | DELAYED_RELEASE_TABLET | Freq: Once | ORAL | Status: AC
  Administered 2018-03-20: 5 mg via ORAL
  Filled 2018-03-20: qty 1

## 2018-03-20 MED ORDER — MILRINONE LACTATE IN DEXTROSE 20-5 MG/100ML-% IV SOLN
0.3000 ug/kg/min | INTRAVENOUS | Status: DC
  Filled 2018-03-20: qty 100

## 2018-03-20 MED ORDER — PHENYLEPHRINE HCL-NACL 20-0.9 MG/250ML-% IV SOLN
30.0000 ug/min | INTRAVENOUS | Status: AC
  Administered 2018-03-21: 15 ug/min via INTRAVENOUS
  Filled 2018-03-20: qty 250

## 2018-03-20 MED ORDER — TRANEXAMIC ACID (OHS) PUMP PRIME SOLUTION
2.0000 mg/kg | INTRAVENOUS | Status: DC
  Filled 2018-03-20: qty 1.1

## 2018-03-20 MED ORDER — CHLORHEXIDINE GLUCONATE 0.12 % MT SOLN
15.0000 mL | Freq: Once | OROMUCOSAL | Status: AC
  Administered 2018-03-21: 15 mL via OROMUCOSAL
  Filled 2018-03-20: qty 15

## 2018-03-20 MED ORDER — TEMAZEPAM 15 MG PO CAPS: 15.0000 mg | ORAL_CAPSULE | Freq: Once | ORAL | Status: DC | PRN

## 2018-03-20 MED ORDER — CHLORHEXIDINE GLUCONATE 4 % EX LIQD
60.0000 mL | Freq: Once | CUTANEOUS | Status: AC
  Administered 2018-03-20: 4 via TOPICAL
  Filled 2018-03-20: qty 60

## 2018-03-20 MED ORDER — FERROUS SULFATE 325 (65 FE) MG PO TABS: 325.0000 mg | ORAL_TABLET | Freq: Two times a day (BID) | ORAL | Status: DC

## 2018-03-20 MED ORDER — SODIUM CHLORIDE 0.9 % IV SOLN
750.0000 mg | INTRAVENOUS | Status: DC
  Filled 2018-03-20: qty 750

## 2018-03-20 MED ORDER — IOHEXOL 350 MG/ML SOLN
100.0000 mL | Freq: Once | INTRAVENOUS | Status: AC | PRN
  Administered 2018-03-20: 100 mL via INTRAVENOUS

## 2018-03-20 MED ORDER — NITROGLYCERIN IN D5W 200-5 MCG/ML-% IV SOLN
2.0000 ug/min | INTRAVENOUS | Status: AC
  Administered 2018-03-21: 10 ug/min via INTRAVENOUS
  Filled 2018-03-20: qty 250

## 2018-03-20 MED ORDER — SODIUM CHLORIDE 0.9 % IV SOLN
INTRAVENOUS | Status: DC
  Filled 2018-03-20: qty 30

## 2018-03-20 MED ORDER — DOPAMINE-DEXTROSE 3.2-5 MG/ML-% IV SOLN
0.0000 ug/kg/min | INTRAVENOUS | Status: DC
  Filled 2018-03-20: qty 250

## 2018-03-20 MED ORDER — VANCOMYCIN HCL 1000 MG IV SOLR
INTRAVENOUS | Status: AC
  Administered 2018-03-21: 1000 mL
  Filled 2018-03-20: qty 1000

## 2018-03-20 MED ORDER — VANCOMYCIN HCL 10 G IV SOLR
1250.0000 mg | INTRAVENOUS | Status: AC
  Administered 2018-03-21: 1250 mg via INTRAVENOUS
  Filled 2018-03-20: qty 1250

## 2018-03-20 MED ORDER — PLASMA-LYTE 148 IV SOLN
INTRAVENOUS | Status: AC
  Administered 2018-03-21: 500 mL
  Filled 2018-03-20: qty 2.5

## 2018-03-20 MED ORDER — SODIUM CHLORIDE 0.9 % IV SOLN
1.5000 g | INTRAVENOUS | Status: AC
  Administered 2018-03-21: .75 g via INTRAVENOUS
  Administered 2018-03-21: 1.5 g via INTRAVENOUS
  Filled 2018-03-20: qty 1.5

## 2018-03-20 NOTE — Progress Notes (Signed)
RN rounded on pt. Pt states she does not need anything at this time. 

## 2018-03-20 NOTE — Progress Notes (Signed)
Pt returned from CT °

## 2018-03-20 NOTE — Progress Notes (Signed)
CARDIAC REHAB PHASE I   PRE:  Rate/Rhythm: 80 SR    BP: sitting 126/69    SaO2: 97 RA  MODE:  Ambulation: 470 ft   POST:  Rate/Rhythm: 94 SR    BP: sitting 128/76     SaO2: 99 RA  Tolerated very well. Loves to walk, no c/o. Steady. Discussed mobility, IS, and timeline for surgery. Gave [facility] booklet and careguide. Pt only able to do 250 mL on IS, which produced a cough each time. Family present and supportive. They will help out at d/c. [place], ACSM 03/20/2018 3:19 PM

## 2018-03-20 NOTE — Progress Notes (Addendum)
Progress Note  Patient Name: Wanda Nelson Date of Encounter: 03/20/2018  Primary Cardiologist: [name], MD  Subjective   Feeling well, no complaints. Cheerful. Daughter is Horald Pollen, retired former Marine scientist of Dr. Haroldine Laws (not currently at bedside).  Very functional prior to admission, walked 2 miles a day even the day of the echo.  Atrial myxoma surgery scheduled for tomorrow with Dr. Roxy Manns.  Inpatient Medications    Scheduled Meds: . aspirin EC  81 mg Oral Daily  . atorvastatin  20 mg Oral Daily  . calcium-vitamin D  1 tablet Oral Q breakfast  . cholecalciferol  1,000 Units Oral Daily  . docusate sodium  100 mg Oral Daily  . ferrous sulfate  650 mg Oral Q breakfast  . fluticasone furoate-vilanterol  1 puff Inhalation Daily  . [START ON 03/21/2018] glutaraldehyde   Topical To OR  . [START ON 03/21/2018] heparin-papaverine-plasmalyte irrigation   Irrigation To OR  . [START ON 03/21/2018] insulin   Intravenous To OR  . [place] Blood Cardioplegia (KBC) lidocaine 2% Syringe (27mL)  13 mL Intracoronary To OR  . [place] Blood Cardioplegia (KBC) lidocaine 2% Syringe (29mL)  13 mL Intracoronary To OR  . [place] Blood Cardioplegia (KBC) mannitol 20% Syringe (1mL)  32 mL Intracoronary To OR  . [place] Blood Cardioplegia (KBC) mannitol 20% Syringe (9mL)  32 mL Intracoronary To OR  . [START ON 03/21/2018] magnesium sulfate  40 mEq Other To OR  . pantoprazole  40 mg Oral Daily  . [START ON 03/21/2018] phenylephrine  30-200 mcg/min Intravenous To OR  . [START ON 03/21/2018] potassium chloride  80 mEq Other To OR  . psyllium  1 packet Oral Daily  . sodium chloride flush  3 mL Intravenous Q12H  . [START ON 03/21/2018] tranexamic acid  15 mg/kg Intravenous To OR  . [START ON 03/21/2018] tranexamic acid  2 mg/kg Intracatheter To OR  . [START ON 03/21/2018] vancomycin 1000 mg in NS (1000 ml) irrigation for Dr. Roxy Manns case   Irrigation To OR   Continuous Infusions: .  sodium chloride    . [START ON 03/21/2018] cefUROXime (ZINACEF)  IV    . [START ON 03/21/2018] cefUROXime (ZINACEF)  IV    . [START ON 03/21/2018] dexmedetomidine    . [START ON 03/21/2018] DOPamine    . [START ON 03/21/2018] epinephrine    . [START ON 03/21/2018] heparin 30,000 units/NS 1000 mL solution for CELLSAVER    . [START ON 03/21/2018] milrinone    . [START ON 03/21/2018] nitroGLYCERIN    . [START ON 03/21/2018] norepinephrine    . [START ON 03/21/2018] tranexamic acid (CYKLOKAPRON) infusion ([facility])    . [START ON 03/21/2018] vancomycin     PRN Meds: sodium chloride, acetaminophen, albuterol, benzonatate, ondansetron (ZOFRAN) IV, sodium chloride flush   Vital Signs    Vitals:   03/19/18 2339 03/20/18 0344 03/20/18 0747 03/20/18 0858  BP: 117/73 126/70  136/66  Pulse: 68 65  79  Resp:    (!) 22  Temp: 97.8 F (36.6 C) (!) 97.5 F (36.4 C)    TempSrc: Oral Oral    SpO2: 97% 97% 97% 100%  Weight:  54.9 kg    Height:        Intake/Output Summary (Last 24 hours) at 03/20/2018 1115 Last data filed at 03/20/2018 0859 Gross per 24 hour  Intake 933.88 ml  Output 1000 ml  Net -66.12 ml   Last 3 Weights 03/20/2018 03/19/2018 03/17/2018  Weight (lbs) 121 lb  122 lb 125 lb 10.6 oz  Weight (kg) 54.885 kg 55.339 kg 57 kg     Telemetry    NSR - Personally Reviewed  Physical Exam   GEN: No acute distress. Thin. HEENT: Normocephalic, atraumatic, sclera non-icteric. Neck: No JVD or bruits. Cardiac: RRR, no murmurs, rubs, or gallops. Radials/DP/PT 1+ and equal bilaterally.  Respiratory: Generally clear to auscultation bilaterally but no wheezing and rhonchi. Breathing is unlabored. GI: Soft, nontender, non-distended, BS +x 4. MS: no deformity. Extremities: No clubbing or cyanosis. No edema. Distal pedal pulses are 2+ and equal bilaterally. Right radial cath site without hematoma or ecchymosis; good pulse. Neuro:  AAOx3. Follows commands. Psych:  Responds to questions appropriately with a  normal affect.  Labs    Chemistry Recent Labs  Lab 03/17/18 1127 03/19/18 1416 03/19/18 1417 03/20/18 0600  NA 140 141 141 141  K 3.8 3.7 3.7 4.0  CL 106  --   --  110  CO2 25  --   --  23  GLUCOSE 115*  --   --  85  BUN 18  --   --  16  CREATININE 0.78  --   --  0.89  CALCIUM 9.5  --   --  9.1  PROT  --   --   --  6.7  ALBUMIN  --   --   --  2.9*  AST  --   --   --  18  ALT  --   --   --  15  ALKPHOS  --   --   --  70  BILITOT  --   --   --  0.8  GFRNONAA >60  --   --  60*  GFRAA >60  --   --  >60  ANIONGAP 9  --   --  8     Hematology Recent Labs  Lab 03/17/18 1127 03/19/18 1416 03/19/18 1417 03/20/18 0600  WBC 8.0  --   --  6.1  RBC 4.88  --   --  4.44  HGB 14.6 12.9 12.6 13.3  HCT 44.5 38.0 37.0 41.2  MCV 91.2  --   --  92.8  MCH 29.9  --   --  30.0  MCHC 32.8  --   --  32.3  RDW 14.2  --   --  14.3  PLT 209  --   --  186    Cardiac Enzymes Recent Labs  Lab 03/17/18 1127  TROPONINI <0.03   No results for input(s): TROPIPOC in the last 168 hours.   BNPNo results for input(s): BNP, PROBNP in the last 168 hours.   DDimer No results for input(s): DDIMER in the last 168 hours.   Radiology    No results found.  Patient Profile     85 y.o. female with ILD/pulmonary fibrosis, hemorrhagic stroke in the 1980s of unknown cause, aortic atherosclerosis by chest CT, large hiatal hernia, HLD, glaucoma was recently seen by PCP for mild increase in DOE.  In this setting, she underwent high resolution chest CT on 02/17/2018 that was consistent with pulmonary fibrosis. She was seen by pulmonology following this with the diagnosis being of uncertain etiology and her CT not being of typical IPF per notes. 2D Echo showed a large mobile atrial myxoma, prompting cardiology eval. R/LHC showed normal coronary arteries, normal right heart filling pressures. Plan is for minimally invasive approach surgery to excise myxoma on Friday.  Assessment & Plan  1. Atrial myxoma -  plan for surgery tomorrow by CVTS.  2. Hyperlipidemia - on chronic statin.  3. Recently diagnosed ILD - appears compensated from pulmonary standpoint, relatively few symptoms prior to diagnosis.  For questions or updates, please contact [facility] Please consult [email or web address] for contact info under Cardiology/STEMI.  Signed, Charlie Pitter, PA-C 03/20/2018, 11:15 AM

## 2018-03-20 NOTE — Progress Notes (Signed)
VASCULAR LAB PRELIMINARY  PRELIMINARY  PRELIMINARY  PRELIMINARY  Pre CABG Dopplers completed.    Preliminary report:  See results in CV Proc  Jomayra Novitsky, RVT 03/20/2018, 12:20 PM

## 2018-03-20 NOTE — Progress Notes (Signed)
RN called CT regarding when pt would be seen. CT states they will get pt shortly.

## 2018-03-20 NOTE — Anesthesia Preprocedure Evaluation (Addendum)
Anesthesia Evaluation  Patient identified by MRN, date of birth, ID band Patient awake    Reviewed: Allergy & Precautions, H&P , NPO status , Patient's Chart, lab work & pertinent test results  Airway Mallampati: II  TM Distance: >3 FB Neck ROM: Full    Dental no notable dental hx. (+) Teeth Intact, Dental Advisory Given   Pulmonary neg pulmonary ROS,    Pulmonary exam normal breath sounds clear to auscultation       Cardiovascular Exercise Tolerance: Good negative cardio ROS   Rhythm:Regular Rate:Normal     Neuro/Psych CVA, No Residual Symptoms negative psych ROS   GI/Hepatic Neg liver ROS, hiatal hernia,   Endo/Other  negative endocrine ROS  Renal/GU negative Renal ROS  negative genitourinary   Musculoskeletal   Abdominal   Peds  Hematology  (+) Blood dyscrasia, anemia ,   Anesthesia Other Findings   Reproductive/Obstetrics negative OB ROS                            Anesthesia Physical Anesthesia Plan  ASA: III  Anesthesia Plan: General   Post-op Pain Management:    Induction: Intravenous  PONV Risk Score and Plan: 3 and Ondansetron and Midazolam  Airway Management Planned: Double Lumen EBT  Additional Equipment: Arterial line, CVP, Ultrasound Guidance Line Placement, TEE and PA Cath  Intra-op Plan:   Post-operative Plan: Possible Post-op intubation/ventilation  Informed Consent: I have reviewed the patients History and Physical, chart, labs and discussed the procedure including the risks, benefits and alternatives for the proposed anesthesia with the patient or authorized representative who has indicated his/her understanding and acceptance.     Dental advisory given  Plan Discussed with: CRNA  Anesthesia Plan Comments:        Anesthesia Quick Evaluation

## 2018-03-20 NOTE — Plan of Care (Signed)
  Problem: Education: Goal: Knowledge of General Education information will improve Description: Including pain rating scale, medication(s)/side effects and non-pharmacologic comfort measures Outcome: Progressing   Problem: Health Behavior/Discharge Planning: Goal: Ability to manage health-related needs will improve Outcome: Progressing   Problem: Clinical Measurements: Goal: Ability to maintain clinical measurements within normal limits will improve Outcome: Progressing   Problem: Activity: Goal: Risk for activity intolerance will decrease Outcome: Progressing   Problem: Nutrition: Goal: Adequate nutrition will be maintained Outcome: Progressing   Problem: Safety: Goal: Ability to remain free from injury will improve Outcome: Progressing   Problem: Skin Integrity: Goal: Risk for impaired skin integrity will decrease Outcome: Progressing   

## 2018-03-20 NOTE — Progress Notes (Signed)
[address]       [place] [address]             [phone]     CARDIOTHORACIC SURGERY PROGRESS NOTE  1 Day Post-Op  S/P Procedure(s) (LRB): RIGHT/LEFT HEART CATH AND CORONARY ANGIOGRAPHY (N/A)  Subjective: No complaints  Objective: Vital signs in last 24 hours: Temp:  [97.5 F (36.4 C)-98.1 F (36.7 C)] 98.1 F (36.7 C) (03/12 1204) Pulse Rate:  [65-85] 70 (03/12 1204) Cardiac Rhythm: Normal sinus rhythm (03/12 0728) Resp:  [19-22] 19 (03/12 1204) BP: (90-138)/(61-80) 121/66 (03/12 1204) SpO2:  [95 %-100 %] 97 % (03/12 1204) Weight:  [54.9 kg] 54.9 kg (03/12 0344)  Physical Exam:  Rhythm:   sinus  Breath sounds: clear  Heart sounds:  RRR  Incisions:  n/a  Abdomen:  soft  Extremities:  warm   Intake/Output from previous day: 03/11 0701 - 03/12 0700 In: 570.9 [P.O.:240; I.V.:330.9] Out: 700 [Urine:700] Intake/Output this shift: Total I/O In: 363 [P.O.:360; I.V.:3] Out: 300 [Urine:300]  Lab Results: Recent Labs    03/19/18 1417 03/20/18 0600  WBC  --  6.1  HGB 12.6 13.3  HCT 37.0 41.2  PLT  --  186   BMET:  Recent Labs    03/19/18 1417 03/20/18 0600  NA 141 141  K 3.7 4.0  CL  --  110  CO2  --  23  GLUCOSE  --  85  BUN  --  16  CREATININE  --  0.89  CALCIUM  --  9.1    CBG (last 3)  No results for input(s): GLUCAP in the last 72 hours. PT/INR:   Recent Labs    03/20/18 0600  LABPROT 13.5  INR 1.0    CTA: CT ANGIOGRAPHY CHEST, ABDOMEN AND PELVIS  TECHNIQUE: Multidetector CT imaging through the chest, abdomen and pelvis was performed using the standard protocol during bolus administration of intravenous contrast. Multiplanar reconstructed images and MIPs were obtained and reviewed to evaluate the vascular anatomy.  CONTRAST:  155mL OMNIPAQUE IOHEXOL 350 MG/ML SOLN  COMPARISON:  None.  FINDINGS: CTA CHEST FINDINGS  Cardiovascular: Atherosclerosis of thoracic aorta is noted without aneurysm or  dissection. Normal cardiac size. No pericardial effusion. 2.9 x 2.6 cm mass is noted in left atrium most consistent with myxoma.  Mediastinum/Nodes: Large hiatal hernia is noted. Thyroid gland is unremarkable. No adenopathy is noted.  Lungs/Pleura: No pneumothorax or pleural effusion is noted. 6 mm nodule is noted in right upper lobe best seen on image number 67 of series 8. Minimal scarring or subsegmental atelectasis is noted in both lung bases.  Musculoskeletal: No chest wall abnormality. No acute or significant osseous findings.  Review of the MIP images confirms the above findings.  CTA ABDOMEN AND PELVIS FINDINGS  VASCULAR  Aorta: Normal caliber aorta without aneurysm, dissection, vasculitis or significant stenosis.  Celiac: Patent without evidence of aneurysm, dissection, vasculitis or significant stenosis.  SMA: Patent without evidence of aneurysm, dissection, vasculitis or significant stenosis.  Renals: Both renal arteries are patent without evidence of aneurysm, dissection, vasculitis, fibromuscular dysplasia or significant stenosis.  IMA: Patent without evidence of aneurysm, dissection, vasculitis or significant stenosis.  Inflow: Patent without evidence of aneurysm, dissection, vasculitis or significant stenosis.  Veins: No obvious venous abnormality within the limitations of this arterial phase study.  Review of the MIP images confirms the above findings.  NON-VASCULAR  Hepatobiliary: No focal liver abnormality is seen. No gallstones, gallbladder wall  thickening, or biliary dilatation.  Pancreas: Unremarkable. No pancreatic ductal dilatation or surrounding inflammatory changes.  Spleen: Normal in size without focal abnormality.  Adrenals/Urinary Tract: Adrenal glands are unremarkable. Kidneys are normal, without renal calculi, focal lesion, or hydronephrosis. Bladder is unremarkable.  Stomach/Bowel: Stomach is within normal  limits. Appendix appears normal. No evidence of bowel wall thickening, distention, or inflammatory changes.  Lymphatic: No significant vascular findings are present. No enlarged abdominal or pelvic lymph nodes.  Reproductive: Status post hysterectomy. No adnexal masses.  Other: No abdominal wall hernia or abnormality. No abdominopelvic ascites.  Musculoskeletal: No acute or significant osseous findings.  Review of the MIP images confirms the above findings.  IMPRESSION: No evidence of thoracic or abdominal aortic dissection or aneurysm.  No evidence of significant mesenteric or renal artery stenosis. Iliac arteries are widely patent.  2.9 cm pedunculated mass is noted in left atrium most consistent with myxoma.  Large hiatal hernia is noted.  6 mm nodule is noted in right lower lobe. Non-contrast chest CT at 6-12 months is recommended. If the nodule is stable at time of repeat CT, then future CT at 18-24 months (from today's scan) is considered optional for low-risk patients, but is recommended for high-risk patients. This recommendation follows the consensus statement: Guidelines for Management of Incidental Pulmonary Nodules Detected on CT Images: From the Fleischner Society 2017; Radiology 2017; 284:228-243.  Aortic Atherosclerosis (ICD10-I70.0).   Electronically Signed   By: Marijo Conception, M.D.   On: 03/20/2018 14:28   Assessment/Plan: S/P Procedure(s) (LRB): RIGHT/LEFT HEART CATH AND CORONARY ANGIOGRAPHY (N/A)  Results of CTA noted.  No contraindications to peripheral cannulation for surgery.  Small benign-appearing nodule right lung will need f/u imaging in 6 months.  I have again reviewed the indications, risks and potential benefits of surgery with the patient and her family.  Expectations for her recovery discussed.  For OR tomorrow.  All questions answered.   I spent in excess of 15 minutes during the conduct of this hospital encounter  and >50% of this time involved direct face-to-face encounter with the patient for counseling and/or coordination of their care.   Rexene Alberts, MD 03/20/2018 3:29 PM

## 2018-03-21 ENCOUNTER — Inpatient Hospital Stay (HOSPITAL_COMMUNITY): Payer: Medicare Other | Admitting: Anesthesiology

## 2018-03-21 ENCOUNTER — Other Ambulatory Visit: Payer: Self-pay

## 2018-03-21 ENCOUNTER — Encounter (HOSPITAL_COMMUNITY)
Admission: RE | Disposition: A | Discharge status: Home / Self Care | Payer: Self-pay | Source: Home / Self Care | Attending: Thoracic Surgery (Cardiothoracic Vascular Surgery)

## 2018-03-21 ENCOUNTER — Encounter (HOSPITAL_COMMUNITY): Payer: Self-pay | Admitting: *Deleted

## 2018-03-21 ENCOUNTER — Inpatient Hospital Stay (HOSPITAL_COMMUNITY): Payer: Medicare Other

## 2018-03-21 DIAGNOSIS — D219 Benign neoplasm of connective and other soft tissue, unspecified: Secondary | ICD-10-CM

## 2018-03-21 DIAGNOSIS — D151 Benign neoplasm of heart: Secondary | ICD-10-CM

## 2018-03-21 DIAGNOSIS — Z9889 Other specified postprocedural states: Secondary | ICD-10-CM

## 2018-03-21 HISTORY — PX: EXCISION OF ATRIAL MYXOMA: SHX5821

## 2018-03-21 HISTORY — PX: CLIPPING OF ATRIAL APPENDAGE: SHX5773

## 2018-03-21 HISTORY — PX: TEE WITHOUT CARDIOVERSION: SHX5443

## 2018-03-21 HISTORY — DX: Benign neoplasm of heart: D15.1

## 2018-03-21 HISTORY — DX: Other specified postprocedural states: Z98.890

## 2018-03-21 LAB — CBC
HCT: 41.9 % (ref 36.0–46.0)
HCT: 32 % — ABNORMAL LOW (ref 36.0–46.0)
Hemoglobin: 13.5 g/dL (ref 12.0–15.0)
Hemoglobin: 10.3 g/dL — ABNORMAL LOW (ref 12.0–15.0)
MCH: 29.2 pg (ref 26.0–34.0)
MCH: 28.3 pg (ref 26.0–34.0)
MCHC: 32.2 g/dL (ref 30.0–36.0)
MCHC: 32.2 g/dL (ref 30.0–36.0)
MCV: 90.7 fL (ref 80.0–100.0)
MCV: 87.9 fL (ref 80.0–100.0)
Platelets: 183 10*3/uL (ref 150–400)
Platelets: 133 K/uL — ABNORMAL LOW (ref 150–400)
RBC: 4.62 MIL/uL (ref 3.87–5.11)
RBC: 3.64 MIL/uL — ABNORMAL LOW (ref 3.87–5.11)
RDW: 14.2 % (ref 11.5–15.5)
RDW: 14.8 % (ref 11.5–15.5)
WBC: 6.8 10*3/uL (ref 4.0–10.5)
WBC: 15.1 K/uL — ABNORMAL HIGH (ref 4.0–10.5)
nRBC: 0 % (ref 0.0–0.2)
nRBC: 0 % (ref 0.0–0.2)

## 2018-03-21 LAB — POCT I-STAT 7, (LYTES, BLD GAS, ICA,H+H)
Acid-base deficit: 4 mmol/L — ABNORMAL HIGH (ref 0.0–2.0)
Acid-base deficit: 5 mmol/L — ABNORMAL HIGH (ref 0.0–2.0)
Bicarbonate: 20.6 mmol/L (ref 20.0–28.0)
Bicarbonate: 20.5 mmol/L (ref 20.0–28.0)
Calcium, Ion: 0.95 mmol/L — ABNORMAL LOW (ref 1.15–1.40)
Calcium, Ion: 1.01 mmol/L — ABNORMAL LOW (ref 1.15–1.40)
HCT: 26 % — ABNORMAL LOW (ref 36.0–46.0)
HCT: 32 % — ABNORMAL LOW (ref 36.0–46.0)
Hemoglobin: 8.8 g/dL — ABNORMAL LOW (ref 12.0–15.0)
Hemoglobin: 10.9 g/dL — ABNORMAL LOW (ref 12.0–15.0)
O2 Saturation: 99 %
O2 Saturation: 100 %
PCO2 ART: 39.2 mmHg (ref 32.0–48.0)
POTASSIUM: 3.8 mmol/L (ref 3.5–5.1)
Patient temperature: 33.7
Patient temperature: 37
Potassium: 4.7 mmol/L (ref 3.5–5.1)
Sodium: 141 mmol/L (ref 135–145)
Sodium: 142 mmol/L (ref 135–145)
TCO2: 22 mmol/L (ref 22–32)
TCO2: 22 mmol/L (ref 22–32)
pCO2 arterial: 28.3 mmHg — ABNORMAL LOW (ref 32.0–48.0)
pH, Arterial: 7.456 — ABNORMAL HIGH (ref 7.350–7.450)
pH, Arterial: 7.327 — ABNORMAL LOW (ref 7.350–7.450)
pO2, Arterial: 143 mmHg — ABNORMAL HIGH (ref 83.0–108.0)
pO2, Arterial: 246 mmHg — ABNORMAL HIGH (ref 83.0–108.0)

## 2018-03-21 LAB — HEMOGLOBIN AND HEMATOCRIT, BLOOD
HCT: 29.7 % — ABNORMAL LOW (ref 36.0–46.0)
HCT: 25.3 % — ABNORMAL LOW (ref 36.0–46.0)
Hemoglobin: 9.3 g/dL — ABNORMAL LOW (ref 12.0–15.0)
Hemoglobin: 8.3 g/dL — ABNORMAL LOW (ref 12.0–15.0)

## 2018-03-21 LAB — ECHO INTRAOPERATIVE TEE
Height: 62 in
Weight: 1918.88 oz

## 2018-03-21 LAB — BASIC METABOLIC PANEL
Anion gap: 9 (ref 5–15)
BUN: 19 mg/dL (ref 8–23)
CO2: 23 mmol/L (ref 22–32)
Calcium: 9.2 mg/dL (ref 8.9–10.3)
Chloride: 106 mmol/L (ref 98–111)
Creatinine, Ser: 0.87 mg/dL (ref 0.44–1.00)
GFR calc Af Amer: >60 mL/min (ref >60)
GFR calc non Af Amer: >60 mL/min (ref >60)
Glucose, Bld: 93 mg/dL (ref 70–99)
Potassium: 3.9 mmol/L (ref 3.5–5.1)
SODIUM: 138 mmol/L (ref 135–145)

## 2018-03-21 LAB — PREPARE RBC (CROSSMATCH)

## 2018-03-21 LAB — GLUCOSE, CAPILLARY
Glucose-Capillary: 121 mg/dL — ABNORMAL HIGH (ref 70–99)
Glucose-Capillary: 125 mg/dL — ABNORMAL HIGH (ref 70–99)
Glucose-Capillary: 133 mg/dL — ABNORMAL HIGH (ref 70–99)
Glucose-Capillary: 140 mg/dL — ABNORMAL HIGH (ref 70–99)

## 2018-03-21 LAB — PLATELET COUNT
PLATELETS: 131 10*3/uL — AB (ref 150–400)
Platelets: 76 10*3/uL — ABNORMAL LOW (ref 150–400)

## 2018-03-21 LAB — FIBRINOGEN: Fibrinogen: 150 mg/dL — ABNORMAL LOW (ref 210–475)

## 2018-03-21 LAB — SURGICAL PCR SCREEN
MRSA, PCR: NEGATIVE
Staphylococcus aureus: NEGATIVE

## 2018-03-21 SURGERY — ECHOCARDIOGRAM, TRANSESOPHAGEAL
Anesthesia: General | Site: Chest

## 2018-03-21 MED ORDER — LIDOCAINE 2% (20 MG/ML) 5 ML SYRINGE
INTRAMUSCULAR | Status: AC
  Filled 2018-03-21: qty 5

## 2018-03-21 MED ORDER — PROTAMINE SULFATE 10 MG/ML IV SOLN
INTRAVENOUS | Status: DC | PRN
  Administered 2018-03-21: 10 mg via INTRAVENOUS
  Administered 2018-03-21: 240 mg via INTRAVENOUS
  Administered 2018-03-21: 190 mg via INTRAVENOUS
  Administered 2018-03-21: 10 mg via INTRAVENOUS

## 2018-03-21 MED ORDER — EPHEDRINE SULFATE 50 MG/ML IJ SOLN
INTRAMUSCULAR | Status: DC | PRN
  Administered 2018-03-21 (×3): 5 mg via INTRAVENOUS

## 2018-03-21 MED ORDER — SODIUM CHLORIDE 0.9 % IV SOLN: INTRAVENOUS | Status: AC

## 2018-03-21 MED ORDER — INSULIN REGULAR(HUMAN) IN NACL 100-0.9 UT/100ML-% IV SOLN
INTRAVENOUS | Status: DC
  Administered 2018-03-21: 0.8 [IU]/h via INTRAVENOUS

## 2018-03-21 MED ORDER — ALBUMIN HUMAN 5 % IV SOLN
250.0000 mL | INTRAVENOUS | Status: AC | PRN
  Administered 2018-03-21 – 2018-03-22 (×4): 12.5 g via INTRAVENOUS
  Filled 2018-03-21 (×2): qty 250

## 2018-03-21 MED ORDER — SODIUM CHLORIDE 0.9 % IV SOLN
1.5000 g | Freq: Two times a day (BID) | INTRAVENOUS | Status: AC
  Administered 2018-03-21 – 2018-03-23 (×4): 1.5 g via INTRAVENOUS
  Filled 2018-03-21 (×4): qty 1.5

## 2018-03-21 MED ORDER — PHENYLEPHRINE 40 MCG/ML (10ML) SYRINGE FOR IV PUSH (FOR BLOOD PRESSURE SUPPORT)
PREFILLED_SYRINGE | INTRAVENOUS | Status: AC
  Filled 2018-03-21: qty 40

## 2018-03-21 MED ORDER — ACETAMINOPHEN 160 MG/5ML PO SOLN: 650.0000 mg | Freq: Once | ORAL | Status: AC

## 2018-03-21 MED ORDER — BISACODYL 10 MG RE SUPP: 10.0000 mg | Freq: Every day | RECTAL | Status: DC

## 2018-03-21 MED ORDER — PHENYLEPHRINE HCL-NACL 20-0.9 MG/250ML-% IV SOLN: 0.0000 ug/min | INTRAVENOUS | Status: DC

## 2018-03-21 MED ORDER — MIDAZOLAM HCL 2 MG/2ML IJ SOLN: 2.0000 mg | INTRAMUSCULAR | Status: DC | PRN

## 2018-03-21 MED ORDER — SODIUM CHLORIDE 0.9 % IV SOLN
250.0000 mL | INTRAVENOUS | Status: DC
  Administered 2018-03-22: 250 mL via INTRAVENOUS

## 2018-03-21 MED ORDER — FENTANYL CITRATE (PF) 250 MCG/5ML IJ SOLN
INTRAMUSCULAR | Status: DC | PRN
  Administered 2018-03-21: 50 ug via INTRAVENOUS
  Administered 2018-03-21: 100 ug via INTRAVENOUS
  Administered 2018-03-21: 50 ug via INTRAVENOUS
  Administered 2018-03-21: 125 ug via INTRAVENOUS
  Administered 2018-03-21 (×2): 50 ug via INTRAVENOUS
  Administered 2018-03-21: 125 ug via INTRAVENOUS
  Administered 2018-03-21 (×3): 50 ug via INTRAVENOUS
  Administered 2018-03-21: 100 ug via INTRAVENOUS
  Administered 2018-03-21: 125 ug via INTRAVENOUS
  Administered 2018-03-21: 25 ug via INTRAVENOUS
  Administered 2018-03-21 (×4): 50 ug via INTRAVENOUS
  Administered 2018-03-21: 100 ug via INTRAVENOUS

## 2018-03-21 MED ORDER — 0.9 % SODIUM CHLORIDE (POUR BTL) OPTIME
TOPICAL | Status: DC | PRN
  Administered 2018-03-21: 5000 mL

## 2018-03-21 MED ORDER — SODIUM CHLORIDE 0.9% IV SOLUTION: Freq: Once | INTRAVENOUS | Status: DC

## 2018-03-21 MED ORDER — DOCUSATE SODIUM 100 MG PO CAPS
200.0000 mg | ORAL_CAPSULE | Freq: Every day | ORAL | Status: DC
  Administered 2018-03-22 – 2018-03-29 (×8): 200 mg via ORAL
  Filled 2018-03-21 (×8): qty 2

## 2018-03-21 MED ORDER — SODIUM CHLORIDE (PF) 0.9 % IJ SOLN
OROMUCOSAL | Status: DC | PRN
  Administered 2018-03-21 (×6): 4 mL via TOPICAL

## 2018-03-21 MED ORDER — POTASSIUM CHLORIDE 10 MEQ/50ML IV SOLN
10.0000 meq | INTRAVENOUS | Status: AC
  Administered 2018-03-21 (×3): 10 meq via INTRAVENOUS

## 2018-03-21 MED ORDER — ALBUMIN HUMAN 5 % IV SOLN
INTRAVENOUS | Status: DC | PRN
  Administered 2018-03-21 (×2): via INTRAVENOUS

## 2018-03-21 MED ORDER — ASPIRIN EC 325 MG PO TBEC
325.0000 mg | DELAYED_RELEASE_TABLET | Freq: Every day | ORAL | Status: DC
  Administered 2018-03-23 – 2018-03-29 (×7): 325 mg via ORAL
  Filled 2018-03-21 (×7): qty 1

## 2018-03-21 MED ORDER — TRAMADOL HCL 50 MG PO TABS
50.0000 mg | ORAL_TABLET | ORAL | Status: DC | PRN
  Administered 2018-03-22 – 2018-03-25 (×7): 100 mg via ORAL
  Administered 2018-03-26 – 2018-03-27 (×3): 50 mg via ORAL
  Filled 2018-03-21 (×2): qty 2
  Filled 2018-03-21: qty 1
  Filled 2018-03-21 (×2): qty 2
  Filled 2018-03-21: qty 1
  Filled 2018-03-21 (×3): qty 2
  Filled 2018-03-21: qty 1

## 2018-03-21 MED ORDER — LACTATED RINGERS IV SOLN: INTRAVENOUS | Status: DC

## 2018-03-21 MED ORDER — SODIUM CHLORIDE 0.9% FLUSH: 3.0000 mL | INTRAVENOUS | Status: DC | PRN

## 2018-03-21 MED ORDER — METOPROLOL TARTRATE 25 MG/10 ML ORAL SUSPENSION
12.5000 mg | Freq: Two times a day (BID) | ORAL | Status: DC
  Administered 2018-03-21 – 2018-03-22 (×2): 12.5 mg
  Filled 2018-03-21 (×2): qty 5

## 2018-03-21 MED ORDER — LACTATED RINGERS IV SOLN
INTRAVENOUS | Status: DC
  Administered 2018-03-21: 21:00:00 via INTRAVENOUS

## 2018-03-21 MED ORDER — ACETAMINOPHEN 500 MG PO TABS: 1000.0000 mg | ORAL_TABLET | Freq: Once | ORAL | Status: DC

## 2018-03-21 MED ORDER — BUPIVACAINE HCL (PF) 0.5 % IJ SOLN
INTRAMUSCULAR | Status: DC | PRN
  Administered 2018-03-21: 10 mL

## 2018-03-21 MED ORDER — ACETAMINOPHEN 160 MG/5ML PO SOLN
1000.0000 mg | Freq: Four times a day (QID) | ORAL | Status: DC
  Administered 2018-03-21 – 2018-03-22 (×2): 1000 mg
  Filled 2018-03-21 (×2): qty 40.6

## 2018-03-21 MED ORDER — DEXAMETHASONE SODIUM PHOSPHATE 10 MG/ML IJ SOLN
INTRAMUSCULAR | Status: DC | PRN
  Administered 2018-03-21: 10 mg via INTRAVENOUS

## 2018-03-21 MED ORDER — FAMOTIDINE 20 MG IN NS 100 ML IVPB
20.0000 mg | Freq: Two times a day (BID) | INTRAVENOUS | Status: AC
  Administered 2018-03-21 – 2018-03-22 (×2): 20 mg via INTRAVENOUS
  Filled 2018-03-21 (×3): qty 100

## 2018-03-21 MED ORDER — ACETAMINOPHEN 500 MG PO TABS
1000.0000 mg | ORAL_TABLET | Freq: Four times a day (QID) | ORAL | Status: AC
  Administered 2018-03-22 – 2018-03-26 (×17): 1000 mg via ORAL
  Filled 2018-03-21 (×17): qty 2

## 2018-03-21 MED ORDER — FENTANYL CITRATE (PF) 100 MCG/2ML IJ SOLN
25.0000 ug | INTRAMUSCULAR | Status: DC | PRN
  Administered 2018-03-22 – 2018-03-23 (×7): 25 ug via INTRAVENOUS
  Filled 2018-03-21 (×7): qty 2

## 2018-03-21 MED ORDER — SODIUM CHLORIDE 0.45 % IV SOLN: INTRAVENOUS | Status: DC | PRN

## 2018-03-21 MED ORDER — LACTATED RINGERS IV SOLN
INTRAVENOUS | Status: DC | PRN
  Administered 2018-03-21 (×2): via INTRAVENOUS

## 2018-03-21 MED ORDER — BUPIVACAINE 0.5 % ON-Q PUMP SINGLE CATH 400 ML
400.0000 mL | INJECTION | Status: AC
  Administered 2018-03-21: 400 mL
  Filled 2018-03-21 (×2): qty 400

## 2018-03-21 MED ORDER — DEXMEDETOMIDINE HCL IN NACL 200 MCG/50ML IV SOLN
INTRAVENOUS | Status: AC
  Filled 2018-03-21: qty 50

## 2018-03-21 MED ORDER — BISACODYL 5 MG PO TBEC
10.0000 mg | DELAYED_RELEASE_TABLET | Freq: Every day | ORAL | Status: DC
  Administered 2018-03-22 – 2018-03-29 (×8): 10 mg via ORAL
  Filled 2018-03-21 (×9): qty 2

## 2018-03-21 MED ORDER — ONDANSETRON HCL 4 MG/2ML IJ SOLN
INTRAMUSCULAR | Status: AC
  Filled 2018-03-21: qty 2

## 2018-03-21 MED ORDER — SODIUM CHLORIDE 0.9% FLUSH
3.0000 mL | Freq: Two times a day (BID) | INTRAVENOUS | Status: DC
  Administered 2018-03-22 – 2018-03-28 (×10): 3 mL via INTRAVENOUS

## 2018-03-21 MED ORDER — HEPARIN SODIUM (PORCINE) 1000 UNIT/ML IJ SOLN
INTRAMUSCULAR | Status: AC
  Filled 2018-03-21: qty 3

## 2018-03-21 MED ORDER — SODIUM CHLORIDE 0.9 % IV SOLN
INTRAVENOUS | Status: DC
  Administered 2018-03-22: 10:00:00 via INTRAVENOUS

## 2018-03-21 MED ORDER — LACTATED RINGERS IV SOLN
INTRAVENOUS | Status: DC | PRN
  Administered 2018-03-21 (×2): via INTRAVENOUS

## 2018-03-21 MED ORDER — PROTAMINE SULFATE 10 MG/ML IV SOLN
INTRAVENOUS | Status: AC
  Filled 2018-03-21: qty 50

## 2018-03-21 MED ORDER — ROCURONIUM BROMIDE 10 MG/ML (PF) SYRINGE
PREFILLED_SYRINGE | INTRAVENOUS | Status: DC | PRN
  Administered 2018-03-21: 30 mg via INTRAVENOUS
  Administered 2018-03-21: 20 mg via INTRAVENOUS
  Administered 2018-03-21: 70 mg via INTRAVENOUS
  Administered 2018-03-21: 30 mg via INTRAVENOUS
  Administered 2018-03-21: 50 mg via INTRAVENOUS

## 2018-03-21 MED ORDER — ORAL CARE MOUTH RINSE
15.0000 mL | OROMUCOSAL | Status: DC
  Administered 2018-03-21 – 2018-03-22 (×7): 15 mL via OROMUCOSAL

## 2018-03-21 MED ORDER — ASPIRIN 81 MG PO CHEW
324.0000 mg | CHEWABLE_TABLET | Freq: Every day | ORAL | Status: DC
  Administered 2018-03-22: 324 mg
  Filled 2018-03-21: qty 4

## 2018-03-21 MED ORDER — PHENYLEPHRINE HCL 10 MG/ML IJ SOLN
INTRAMUSCULAR | Status: DC | PRN
  Administered 2018-03-21: 80 mg via INTRAVENOUS
  Administered 2018-03-21: 200 mg via INTRAVENOUS
  Administered 2018-03-21: 80 mg via INTRAVENOUS
  Administered 2018-03-21: 200 mg via INTRAVENOUS
  Administered 2018-03-21: 80 mg via INTRAVENOUS

## 2018-03-21 MED ORDER — INSULIN REGULAR BOLUS VIA INFUSION
0.0000 [IU] | Freq: Three times a day (TID) | INTRAVENOUS | Status: DC
  Filled 2018-03-21: qty 10

## 2018-03-21 MED ORDER — NITROGLYCERIN IN D5W 200-5 MCG/ML-% IV SOLN
0.0000 ug/min | INTRAVENOUS | Status: DC
  Administered 2018-03-21: 0 ug/min via INTRAVENOUS

## 2018-03-21 MED ORDER — KENNESTONE BLOOD CARDIOPLEGIA (KBC) MANNITOL SYRINGE (20%, 32ML)
32.0000 mL | INTRAVENOUS | Status: DC
  Filled 2018-03-21: qty 32

## 2018-03-21 MED ORDER — PANTOPRAZOLE SODIUM 40 MG PO TBEC
40.0000 mg | DELAYED_RELEASE_TABLET | Freq: Every day | ORAL | Status: DC
  Administered 2018-03-23 – 2018-03-29 (×7): 40 mg via ORAL
  Filled 2018-03-21 (×7): qty 1

## 2018-03-21 MED ORDER — MIDAZOLAM HCL 2 MG/2ML IJ SOLN
INTRAMUSCULAR | Status: AC
  Filled 2018-03-21: qty 2

## 2018-03-21 MED ORDER — LACTATED RINGERS IV SOLN
INTRAVENOUS | Status: DC | PRN
  Administered 2018-03-21 (×2): via INTRAVENOUS

## 2018-03-21 MED ORDER — ONDANSETRON HCL 4 MG/2ML IJ SOLN
4.0000 mg | Freq: Four times a day (QID) | INTRAMUSCULAR | Status: DC | PRN
  Administered 2018-03-21 – 2018-03-22 (×2): 4 mg via INTRAVENOUS
  Filled 2018-03-21 (×2): qty 2

## 2018-03-21 MED ORDER — FENTANYL CITRATE (PF) 100 MCG/2ML IJ SOLN
INTRAMUSCULAR | Status: AC
  Filled 2018-03-21: qty 2

## 2018-03-21 MED ORDER — HEPARIN SODIUM (PORCINE) 1000 UNIT/ML IJ SOLN
INTRAMUSCULAR | Status: DC | PRN
  Administered 2018-03-21: 5000 [IU] via INTRAVENOUS
  Administered 2018-03-21 (×2): 20000 [IU] via INTRAVENOUS

## 2018-03-21 MED ORDER — HEPARIN SODIUM (PORCINE) 1000 UNIT/ML IJ SOLN
INTRAMUSCULAR | Status: AC
  Filled 2018-03-21: qty 1

## 2018-03-21 MED ORDER — CHLORHEXIDINE GLUCONATE 0.12 % MT SOLN
15.0000 mL | OROMUCOSAL | Status: AC
  Administered 2018-03-21: 15 mL via OROMUCOSAL

## 2018-03-21 MED ORDER — ONDANSETRON HCL 4 MG/2ML IJ SOLN
INTRAMUSCULAR | Status: DC | PRN
  Administered 2018-03-21: 4 mg via INTRAVENOUS

## 2018-03-21 MED ORDER — METOPROLOL TARTRATE 5 MG/5ML IV SOLN: 2.5000 mg | INTRAVENOUS | Status: DC | PRN

## 2018-03-21 MED ORDER — PROPOFOL 10 MG/ML IV BOLUS
INTRAVENOUS | Status: AC
  Filled 2018-03-21: qty 20

## 2018-03-21 MED ORDER — KENNESTONE BLOOD CARDIOPLEGIA VIAL
13.0000 mL | Status: DC
  Filled 2018-03-21: qty 13

## 2018-03-21 MED ORDER — LACTATED RINGERS IV SOLN: 500.0000 mL | Freq: Once | INTRAVENOUS | Status: DC | PRN

## 2018-03-21 MED ORDER — PROPOFOL 10 MG/ML IV BOLUS
INTRAVENOUS | Status: DC | PRN
  Administered 2018-03-21: 50 mg via INTRAVENOUS

## 2018-03-21 MED ORDER — DEXMEDETOMIDINE HCL IN NACL 200 MCG/50ML IV SOLN
0.0000 ug/kg/h | INTRAVENOUS | Status: DC
  Administered 2018-03-21: 0.4 ug/kg/h via INTRAVENOUS
  Administered 2018-03-22: 0.7 ug/kg/h via INTRAVENOUS
  Filled 2018-03-21: qty 50

## 2018-03-21 MED ORDER — CHLORHEXIDINE GLUCONATE 0.12% ORAL RINSE (MEDLINE KIT)
15.0000 mL | Freq: Two times a day (BID) | OROMUCOSAL | Status: DC
  Administered 2018-03-21 – 2018-03-22 (×2): 15 mL via OROMUCOSAL

## 2018-03-21 MED ORDER — FENTANYL CITRATE (PF) 250 MCG/5ML IJ SOLN
INTRAMUSCULAR | Status: AC
  Filled 2018-03-21: qty 25

## 2018-03-21 MED ORDER — MIDAZOLAM HCL (PF) 10 MG/2ML IJ SOLN
INTRAMUSCULAR | Status: AC
  Filled 2018-03-21: qty 2

## 2018-03-21 MED ORDER — ROCURONIUM BROMIDE 50 MG/5ML IV SOSY
PREFILLED_SYRINGE | INTRAVENOUS | Status: AC
  Filled 2018-03-21: qty 10

## 2018-03-21 MED ORDER — MIDAZOLAM HCL 5 MG/5ML IJ SOLN
INTRAMUSCULAR | Status: DC | PRN
  Administered 2018-03-21 (×5): 1 mg via INTRAVENOUS
  Administered 2018-03-21: 2 mg via INTRAVENOUS

## 2018-03-21 MED ORDER — ACETAMINOPHEN 650 MG RE SUPP
650.0000 mg | Freq: Once | RECTAL | Status: AC
  Administered 2018-03-21: 650 mg via RECTAL

## 2018-03-21 MED ORDER — BUPIVACAINE HCL (PF) 0.5 % IJ SOLN
INTRAMUSCULAR | Status: AC
  Filled 2018-03-21: qty 10

## 2018-03-21 MED ORDER — SODIUM CHLORIDE 0.9% IV SOLUTION
Freq: Once | INTRAVENOUS | Status: AC
  Administered 2018-03-21: 18:00:00 via INTRAVENOUS

## 2018-03-21 MED ORDER — METOPROLOL TARTRATE 12.5 MG HALF TABLET
12.5000 mg | ORAL_TABLET | Freq: Two times a day (BID) | ORAL | Status: DC
  Administered 2018-03-22: 12.5 mg via ORAL
  Filled 2018-03-21: qty 1

## 2018-03-21 MED ORDER — VANCOMYCIN HCL IN DEXTROSE 1-5 GM/200ML-% IV SOLN
1000.0000 mg | Freq: Once | INTRAVENOUS | Status: AC
  Administered 2018-03-22: 1000 mg via INTRAVENOUS
  Filled 2018-03-21: qty 200

## 2018-03-21 MED ORDER — MAGNESIUM SULFATE 4 GM/100ML IV SOLN
4.0000 g | Freq: Once | INTRAVENOUS | Status: AC
  Administered 2018-03-21: 4 g via INTRAVENOUS
  Filled 2018-03-21: qty 100

## 2018-03-21 MED ORDER — DEXAMETHASONE SODIUM PHOSPHATE 10 MG/ML IJ SOLN
INTRAMUSCULAR | Status: AC
  Filled 2018-03-21: qty 1

## 2018-03-21 SURGICAL SUPPLY — 95 items
ADAPTER CARDIO PERF ANTE/RETRO (ADAPTER) ×4 IMPLANT
ARTICLIP LAA PROCLIP II 45 (Clip) ×4 IMPLANT
ATRICLIP EXCLUSION 45 FLEX HDL (Clip) IMPLANT
BAG DECANTER FOR FLEXI CONT (MISCELLANEOUS) ×4 IMPLANT
BLADE SURG 11 STRL SS (BLADE) ×8 IMPLANT
CANISTER SUCT 3000ML PPV (MISCELLANEOUS) ×8 IMPLANT
CANNULA EZ GLIDE AORTIC 21FR (CANNULA) ×4 IMPLANT
CANNULA FEM VENOUS REMOTE 22FR (CANNULA) ×4 IMPLANT
CANNULA FEMORAL ART 14 SM (MISCELLANEOUS) ×4 IMPLANT
CANNULA GUNDRY RCSP 15FR (MISCELLANEOUS) ×4 IMPLANT
CANNULA SUMP PERICARDIAL (CANNULA) ×8 IMPLANT
CATH CPB KIT OWEN (MISCELLANEOUS) ×4 IMPLANT
CATH KIT ON-Q SILVERSOAK 5IN (CATHETERS) ×4 IMPLANT
CATH ROBINSON RED A/P 18FR (CATHETERS) ×4 IMPLANT
CONN ST 1/4X3/8  BEN (MISCELLANEOUS) ×3
CONN ST 1/4X3/8 BEN (MISCELLANEOUS) ×9 IMPLANT
CONNECTOR 1/2X3/8X1/2 3 WAY (MISCELLANEOUS) ×1
CONNECTOR 1/2X3/8X1/2 3WAY (MISCELLANEOUS) ×3 IMPLANT
CONT SPEC 4OZ CLIKSEAL STRL BL (MISCELLANEOUS) ×4 IMPLANT
COUNTER NEEDLE 20 DBL MAG RED (NEEDLE) ×4 IMPLANT
COVER BACK TABLE 24X17X13 BIG (DRAPES) ×4 IMPLANT
COVER PROBE W GEL 5X96 (DRAPES) ×4 IMPLANT
COVER WAND RF STERILE (DRAPES) ×4 IMPLANT
CRADLE DONUT ADULT HEAD (MISCELLANEOUS) ×4 IMPLANT
DERMABOND ADHESIVE PROPEN (GAUZE/BANDAGES/DRESSINGS) ×1
DERMABOND ADVANCED (GAUZE/BANDAGES/DRESSINGS) ×2
DERMABOND ADVANCED .7 DNX12 (GAUZE/BANDAGES/DRESSINGS) ×6 IMPLANT
DERMABOND ADVANCED .7 DNX6 (GAUZE/BANDAGES/DRESSINGS) ×3 IMPLANT
DEVICE ATRICLIP LAA PRCLPII 45 (Clip) ×3 IMPLANT
DEVICE PMI PUNCTURE CLOSURE (MISCELLANEOUS) ×4 IMPLANT
DEVICE TROCAR PUNCTURE CLOSURE (ENDOMECHANICALS) ×4 IMPLANT
DRAIN CHANNEL 28F RND 3/8 FF (WOUND CARE) ×8 IMPLANT
DRAPE BILATERAL SPLIT (DRAPES) ×4 IMPLANT
DRAPE C-ARM 42X72 X-RAY (DRAPES) ×4 IMPLANT
DRAPE CV SPLIT W-CLR ANES SCRN (DRAPES) ×4 IMPLANT
DRAPE INCISE IOBAN 66X45 STRL (DRAPES) ×8 IMPLANT
DRAPE SLUSH/WARMER DISC (DRAPES) ×4 IMPLANT
DRSG AQUACEL AG ADV 3.5X 6 (GAUZE/BANDAGES/DRESSINGS) ×4 IMPLANT
DRSG AQUACEL AG ADV 3.5X14 (GAUZE/BANDAGES/DRESSINGS) ×4 IMPLANT
DRSG COVADERM 4X8 (GAUZE/BANDAGES/DRESSINGS) ×4 IMPLANT
ELECT BLADE 6.5 EXT (BLADE) ×4 IMPLANT
ELECT REM PT RETURN 9FT ADLT (ELECTROSURGICAL) ×8
ELECTRODE REM PT RTRN 9FT ADLT (ELECTROSURGICAL) ×6 IMPLANT
FELT TEFLON 1X6 (MISCELLANEOUS) ×4 IMPLANT
GAUZE SPONGE 4X4 12PLY STRL (GAUZE/BANDAGES/DRESSINGS) ×4 IMPLANT
GLOVE ORTHO TXT STRL SZ7.5 (GLOVE) ×12 IMPLANT
GOWN STRL REUS W/ TWL LRG LVL3 (GOWN DISPOSABLE) ×12 IMPLANT
GOWN STRL REUS W/TWL LRG LVL3 (GOWN DISPOSABLE) ×4
KIT BASIN OR (CUSTOM PROCEDURE TRAY) ×4 IMPLANT
KIT DILATOR VASC 18G NDL (KITS) ×4 IMPLANT
KIT DRAINAGE VACCUM ASSIST (KITS) ×4 IMPLANT
KIT SUCTION CATH 14FR (SUCTIONS) ×4 IMPLANT
KIT TURNOVER KIT B (KITS) ×4 IMPLANT
LEAD PACING MYOCARDI (MISCELLANEOUS) ×4 IMPLANT
LINE VENT (MISCELLANEOUS) ×4 IMPLANT
NDL SUT 1 .5 CRC FRENCH EYE (NEEDLE) ×3 IMPLANT
NEEDLE AORTIC ROOT 14G 7F (CATHETERS) ×4 IMPLANT
NEEDLE FRENCH EYE (NEEDLE) ×1
NS IRRIG 1000ML POUR BTL (IV SOLUTION) ×28 IMPLANT
PACK OPEN HEART (CUSTOM PROCEDURE TRAY) ×4 IMPLANT
PAD ARMBOARD 7.5X6 YLW CONV (MISCELLANEOUS) ×8 IMPLANT
PAD ELECT DEFIB RADIOL ZOLL (MISCELLANEOUS) ×4 IMPLANT
SET CANNULATION TOURNIQUET (MISCELLANEOUS) ×4 IMPLANT
SET CARDIOPLEGIA MPS 5001102 (MISCELLANEOUS) ×4 IMPLANT
SET IRRIG TUBING LAPAROSCOPIC (IRRIGATION / IRRIGATOR) ×8 IMPLANT
SOLUTION ANTI FOG 6CC (MISCELLANEOUS) ×4 IMPLANT
SPONGE LAP 4X18 RFD (DISPOSABLE) ×4 IMPLANT
SUT BONE WAX W31G (SUTURE) ×4 IMPLANT
SUT E-PACK MINIMALLY INVASIVE (SUTURE) ×4 IMPLANT
SUT ETHIBOND 2 0 SH (SUTURE) ×3
SUT ETHIBOND 2 0 SH 36X2 (SUTURE) ×9 IMPLANT
SUT ETHIBOND X763 2 0 SH 1 (SUTURE) ×8 IMPLANT
SUT GORETEX CV 4 TH 22 36 (SUTURE) ×4 IMPLANT
SUT GORETEX CV4 TH-18 (SUTURE) ×8 IMPLANT
SUT MNCRL AB 3-0 PS2 18 (SUTURE) ×8 IMPLANT
SUT PDS AB 1 CTX 36 (SUTURE) ×8 IMPLANT
SUT PROLENE 3 0 SH DA (SUTURE) ×16 IMPLANT
SUT PROLENE 3 0 SH1 36 (SUTURE) ×64 IMPLANT
SUT PROLENE 4 0 RB 1 (SUTURE) ×5
SUT PROLENE 4 0 SH DA (SUTURE) ×8 IMPLANT
SUT PROLENE 4-0 RB1 .5 CRCL 36 (SUTURE) ×15 IMPLANT
SUT SILK  1 MH (SUTURE) ×2
SUT SILK 1 MH (SUTURE) ×6 IMPLANT
SUT VIC AB 2-0 CTX 27 (SUTURE) ×8 IMPLANT
SYR 10ML LL (SYRINGE) ×4 IMPLANT
SYSTEM SAHARA CHEST DRAIN ATS (WOUND CARE) ×4 IMPLANT
TOWEL GREEN STERILE (TOWEL DISPOSABLE) ×4 IMPLANT
TOWEL GREEN STERILE FF (TOWEL DISPOSABLE) ×4 IMPLANT
TROCAR XCEL BLADELESS 5X75MML (TROCAR) ×4 IMPLANT
TROCAR XCEL NON-BLD 11X100MML (ENDOMECHANICALS) ×8 IMPLANT
TUBE SUCT INTRACARD DLP 20F (MISCELLANEOUS) ×4 IMPLANT
TUNNELER SHEATH ON-Q 11GX8 DSP (PAIN MANAGEMENT) ×4 IMPLANT
UNDERPAD 30X30 (UNDERPADS AND DIAPERS) ×4 IMPLANT
WATER STERILE IRR 1000ML POUR (IV SOLUTION) ×8 IMPLANT
WIRE BENTSON .035X145CM (WIRE) ×4 IMPLANT

## 2018-03-21 NOTE — Anesthesia Procedure Notes (Signed)
Central Venous Catheter Insertion Performed by: [name], MD, anesthesiologist Start/End3/13/2020 8:15 AM, 03/21/2018 8:30 AM Patient location: Pre-op. Preanesthetic checklist: patient identified, IV checked, site marked, risks and benefits discussed, surgical consent, monitors and equipment checked, pre-op evaluation, timeout performed and anesthesia consent Hand hygiene performed  and maximum sterile barriers used  PA cath was placed.Swan type:thermodilution PA Cath depth:50 Procedure performed without using ultrasound guided technique. Attempts: 1 Patient tolerated the procedure well with no immediate complications.

## 2018-03-21 NOTE — Anesthesia Procedure Notes (Signed)
Arterial Line Insertion Start/End3/13/2020 8:10 AM, 03/21/2018 8:12 AM Performed by: Clearnce Sorrel, CRNA  Patient location: Pre-op. Preanesthetic checklist: patient identified, IV checked, site marked, risks and benefits discussed, surgical consent, monitors and equipment checked, pre-op evaluation, timeout performed and anesthesia consent Lidocaine 1% used for infiltration Left, radial was placed Catheter size: 20 Fr Hand hygiene performed  and maximum sterile barriers used   Attempts: 1 Procedure performed without using ultrasound guided technique. Following insertion, dressing applied. Post procedure assessment: normal and unchanged

## 2018-03-21 NOTE — Progress Notes (Signed)
      [address]       [place] [address]             [phone]     CARDIOTHORACIC SURGERY PROGRESS NOTE  Subjective: Wanda Nelson has been scheduled for MINIMALLY INVASIVE RESECTION OF LEFT ATRIAL MASS (MYXOMA)  today.   Objective: Vital signs in last 24 hours: Temp:  [97.7 F (36.5 C)-98.1 F (36.7 C)] 97.7 F (36.5 C) (03/13 0512) Pulse Rate:  [63-79] 66 (03/13 0512) Cardiac Rhythm: Normal sinus rhythm (03/12 1910) Resp:  [17-22] 17 (03/13 0512) BP: (121-145)/(66-73) 142/68 (03/13 0512) SpO2:  [97 %-100 %] 97 % (03/13 0512) Weight:  [54.5 kg] 54.5 kg (03/13 0508)  Physical Exam: Unchanged from previously   Intake/Output from previous day: 03/12 0701 - 03/13 0700 In: 723 [P.O.:720; I.V.:3] Out: 2050 [Urine:2050] Intake/Output this shift: Total I/O In: -  Out: 450 [Urine:450]  Lab Results: Recent Labs    03/20/18 0600 03/21/18 0427  WBC 6.1 6.8  HGB 13.3 13.5  HCT 41.2 41.9  PLT 186 183   BMET:  Recent Labs    03/20/18 0600 03/21/18 0427  NA 141 138  K 4.0 3.9  CL 110 106  CO2 23 23  GLUCOSE 85 93  BUN 16 19  CREATININE 0.89 0.87  CALCIUM 9.1 9.2    CBG (last 3)  No results for input(s): GLUCAP in the last 72 hours. PT/INR:   Recent Labs    03/20/18 0600  LABPROT 13.5  INR 1.0    Assessment/Plan:   The various methods of treatment have been discussed with the patient. After consideration of the risks, benefits and treatment options the patient has consented to the planned procedure.   The patient has been seen and labs reviewed. There are no changes in the patient's condition to prevent proceeding with the planned procedure today.   Rexene Alberts, MD 03/21/2018 6:04 AM

## 2018-03-21 NOTE — Progress Notes (Signed)
Called lab to verify status of post op labs, they said they didn't have our samples and would need new ones. Pt has pRBC infusing, so will need to wait until 2 hours post infusion to draw new sample.

## 2018-03-21 NOTE — Op Note (Signed)
CARDIOTHORACIC SURGERY OPERATIVE NOTE  Date of Procedure:  03/21/2018  Preoperative Diagnosis: Left Atrial Mass  Postoperative Diagnosis: Same  Procedure:    Resection of Left Atrial Mass  Autologous pericardial patch reconstruction of interatrial septum  Clipping of left atrial appendage (Atricure Pro245 left atrial clip)   Surgeon: Valentina Gu. Roxy Manns, MD  Assistant: Nicholes Rough, PA-C  Anesthesia: Arabella Merles, MD  Operative Findings:  Large benign-appearing left atrial mass eminating from interatrial septum c/w atrial myxoma  Intial minimally-invasive approach via right mini-thoracotomy converted to median sternotomy to repair bleeding from left atrial appendage              BRIEF CLINICAL NOTE AND INDICATIONS FOR SURGERY  Patient is an 85 year old female with history of anemia, hiatal hernia, hyperlipidemia, and remote history of hemorrhagic stroke who has been referred for surgical consultation to discuss recently discovered left atrial mass consistent with likely atrial myxoma.  Patient has been remarkably healthy and physically active all of her life.  She exercises on a daily basis.  She reports an intermittent persistent dry nonproductive cough off and on for the past year.  Several months ago she began to experience exertional shortness of breath.  Shortness of breath progressed to the point where she occasionally got breathless while talking.  She was evaluated by a pulmonologist and has been diagnosed with possible pulmonary fibrosis based on CT imaging.  She was recently started on an inhaler.  At the family's insistence an echocardiogram was performed to rule out congestive heart failure.  Echocardiogram revealed a large mass appearing to emanate from the intra-atrial septum consistent with likely atrial myxoma.  The patient was admitted to the hospital for diagnostic cardiac catheterization and cardiothoracic surgical consultation was requested.  The  patient has been seen in consultation and counseled at length regarding the indications, risks and potential benefits of surgery.  All questions have been answered, and the patient provides full informed consent for the operation as described.    DETAILS OF THE OPERATIVE PROCEDURE  Preparation:  The patient is brought to the operating room on the above mentioned date and central monitoring was established by the anesthesia team including placement of Swan-Ganz catheter through the right internal jugular vein.  A radial arterial line is placed. The patient is placed in the supine position on the operating table.  Intravenous antibiotics are administered. General endotracheal anesthesia is induced uneventfully. The patient is initially intubated using a dual lumen endotracheal tube.  A Foley catheter is placed.  Baseline transesophageal echocardiogram was performed.  Findings were notable for an obvious large benign appearing mass emanating from the intra-atrial septum and extending through most of the left atrium.  Anatomical characteristics appear pathognomonic for benign atrial myxoma.  There was mild mitral regurgitation.  There was normal left ventricular size and systolic function.  The aortic valve is normal.  Right atrium appeared normal.  A soft roll is placed behind the patient's left scapula and the neck gently extended and turned to the left.   The patient's right neck, chest, abdomen, both groins, and both lower extremities are prepared and draped in a sterile manner. A time out procedure is performed.   Percutaneous Vascular Access:  Percutaneous arterial and venous access were obtained on the right side.  Using ultrasound guidance the right common femoral vein was cannulated using the Seldinger technique and single Perclose vascular closure devise was placed, after which time an 8 French sheath inserted.  The right common femoral artery  was cannulated using a micropuncture wire and  sheath.  A pair of Perclose vascular closure devices were placed at opposing 30 degree angles in the femoral artery, and a 8 French sheath inserted.  The right internal jugular vein was cannulated  using ultrasound guidance and an 8 French sheath inserted.     Surgical Approach:  A right miniature anterolateral thoracotomy incision is performed. The incision is placed just lateral to and superior to the right nipple. The pectoralis major muscle is retracted medially and completely preserved. The right pleural space is entered through the 3rd intercostal space. A soft tissue retractor is placed.  Two 11 mm ports are placed through separate stab incisions inferiorly. The right pleural space is insufflated continuously with carbon dioxide gas through the posterior port during the remainder of the operation.  A pledgeted sutures placed through the dome of the right hemidiaphragm and retracted inferiorly to facilitate exposure.  A longitudinal incision is made in the pericardium 3 cm anterior to the phrenic nerve and silk traction sutures are placed on either side of the incision for exposure.   Extracorporeal Cardiopulmonary Bypass and Myocardial Protection:  The patient was heparinized systemically.  The right common femoral vein is cannulated through the venous sheath and a guidewire advanced into the right atrium using TEE guidance.  The femoral vein cannulated using a 22 Fr long femoral venous cannula.  The right common femoral artery is cannulated through the arterial sheath and a guidewire advanced into the descending thoracic aorta using TEE guidance.  Femoral artery is cannulated with a 18 French femoral arterial cannula.  The right internal jugular vein is cannulated through the venous sheath and a guidewire advanced into the right atrium.  The internal jugular vein is cannulated using a 14 French pediatric femoral venous cannula.   Adequate heparinization is verified.   The entire pre-bypass  portion of the operation was notable for stable hemodynamics.  Cardiopulmonary bypass was begun.  Vacuum assist venous drainage is utilized. The incision in the pericardium is extended in both directions.  A large portion of the pericardium is excised and subsequently tanned in 0.625% glutaraldehyde solution for a total of 3 minutes.  The patch is then rinsed in consecutive baths of saline for 10 minutes each.    Venous drainage and exposure are notably excellent.  Umbilical tapes were placed around the superior vena cava and the inferior vena cava.  An antegrade cardioplegia cannula is placed in the ascending aorta.    The patient is cooled to 32C systemic temperature.  The aortic cross clamp is applied and cardioplegia is delivered initially in an antegrade fashion through the aortic root using modified [place] cold blood cardioplegia ([facility] blood cardioplegia protocol).   The initial cardioplegic arrest is rapid with early diastolic arrest.     Resection of Left Atrial Mass:  A small left atriotomy incision was performed through the interatrial groove.  All blood was evacuated from the left atrium and the left atrial side of the interatrial septum is examined.  There is a large mass with gross appearance consistent with benign atrial myxoma.  A sump drain is placed through the left atriotomy incision to serve as a left ventricular vent.  The inferior vena cava cannula is pulled down until the tip is just outside the junction of the inferior vena cava and the right atrium.  The umbilical tapes were snared.  An oblique right atriotomy incision was performed.  Prolene traction sutures are placed to facilitate  exposure.  A right angle clamp is passed through the left atriotomy incision and positioned along the fossa ovalis along the posterior aspect well away from the base of the myxoma.  An 11 blade knife is utilized to incise the fossa ovalis at this location.  The incision in the fossa ovalis  is subsequently extended circumferentially around the entire base of the myxoma and a careful fashion in order to make sure and excise the entire base of the mass and stalk.  Of note, the base of the mass is fairly broad, necessitating a large oval-shaped defect in the atrial septum extending outside of the borders of the fossa ovalis both medially and posteriorly.  The entire mass with its stalk and interatrial tissue are removed and sent to pathology for routine histology.  The large size of the defect in the intra-atrial septum requires a 2 patch closure.  2 separate oval-shaped patches of autologous pericardium are constructed.  The first patch closes the fossa ovalis and the entire endocardial surface of the left atrium.  This patch is sewn in place using running 3-0 Prolene suture.  The second patch extends from the fossa ovalis over the entire defect in the intra-atrial septum from the endocardial surface of the right atrium.  The posterior left atriotomy incision is closed with a 2 layer closure of running 3-0 Prolene suture while leaving the sump drain in place to serve as a left ventricular vent.   Procedure Completion:  One final dose of warm retrograde "reanimation dose" cardioplegia was administered retrograde through the coronary sinus catheter while all air was evacuated through the aortic root.  The aortic cross clamp was removed after a total cross clamp time of 112 minutes.  Epicardial pacing wires are fixed to the inferior wall of the right ventricule and to the right atrial appendage. The patient is rewarmed to 37C temperature. The left ventricular vent is removed.  The patient is ventilated and flow volumes turned down while the aortic valve, the mitral valve and the interatrial septum are inspected using transesophageal echocardiogram.  The aortic valve and the mitral valve are functioning normally with no significant aortic insufficiency or mitral regurgitation.  The interatrial  septum appears intact with no residual communication between the left and the right atrium.  The antegrade cardioplegia cannula is removed. The patient is weaned and disconnected from cardiopulmonary bypass.  The patient's rhythm at separation from bypass was sinus.  The patient was weaned from bypass without any inotropic support. Total cardiopulmonary bypass time for the operation was 169 minutes.  Followup transesophageal echocardiogram performed after separation from bypass revealed normal left ventricular function with trace mitral regurgitation, no aortic insufficiency, and no residual communication between the left atrium and the right atrium.  The femoral arterial and venous cannulas were removed and all Perclose sutures secured.  Manual pressure was maintained while Protamine was administered.  The right internal jugular cannula was removed and manual pressure held for 15 minutes.  Single lung ventilation was begun. The atriotomy closures were inspected for hemostasis. The pericardial sac was drained using a 28 French Bard drain placed through the anterior port incision.   At this juncture some bleeding is noted from the transverse sinus.  With careful exposure there appears to be a small injury to the left atrial appendage, presumably from the aortic cross-clamp.  Attempts to expose this injury to facilitate repair using primary suture repair or left atrial clip causes additional tearing of the left atrial appendage and increased  bleeding.  A median sternotomy incision is performed.  The pericardium is opened widely and stay sutures placed to facilitate exposure.  The left atrial appendage is exposed and there is a large rent with considerable bleeding.  The appendage is quite frail with very thin walls.  A decision is made not to attempt to repair with the heart full and beating.  The patient is heparinized systemically.  The ascending aorta is cannulated using a standard 7 mm straight aortic  cannula.  A two-stage cannula is placed through the right atrium.  Cardiopulmonary bypass is begun.  The table was rotated towards the right to facilitate exposure.  The heart is gently raised in the left atrium exposed.  Left atrial appendage is clipped using an Atricure left atrial clip (Pro245, size 45 mm) applied under direct vision.  This completely controls the bleeding from the appendage.  2 additional pledgeted Prolene sutures are placed.  The patient is weaned from cardiopulmonary bypass without difficulty.  The patient's rhythm at separation from bypass is sinus rhythm.  Atrial pacing is employed to increase heart rate.  The second cardiopulmonary bypass run duration is 23 minutes such that the grand total for the entire operation is 192 minutes.  Follow-up transesophageal echocardiogram performed after separation from bypass revealed no changes.  Left ventricular function remains normal.  Protamine is administered to reverse anticoagulation.  The aortic and venous cannula are both removed uneventfully.  The mediastinum is carefully inspected for meticulous hemostasis.  The right pleural space is irrigated with saline solution and inspected for hemostasis.  There is moderate coagulopathy.  The patient received 4 units packed red blood cells during the procedure due to anemia which was present preoperatively and exacerbated by acute blood loss and hemodilution during cardiopulmonary bypass.  The patient received a total of 2 packs adult platelets and 2 units fresh frozen plasma due to coagulopathy and thrombocytopenia after separation from cardiopulmonary bypass and reversal of heparin with protamine.  Coagulopathy resolves.  The right pleural space was drained using a 28 French Bard drain placed through the posterior port incision. The miniature thoracotomy incision was closed in multiple layers in routine fashion.  The mediastinum was drained using a 36 French straight chest tube placed through  a separate stab incision inferiorly.  The median sternotomy is closed using double strength sternal wire.  Soft tissues anterior to sternum are closed in multiple layers in routine fashion.  All skin incisions are closed with subcuticular skin closures.  The post-bypass portion of the operation was notable for stable rhythm and hemodynamics.   Disposition:  The patient tolerated the procedure well.  The patient was reintubated using a single lumen endotracheal tube and subsequently transported to the surgical intensive care unit in stable condition.  All sponge instrument and needle counts are verified correct at completion of the operation.     Valentina Gu. Roxy Manns MD 03/21/2018 4:40 PM

## 2018-03-21 NOTE — Anesthesia Procedure Notes (Signed)
Central Venous Catheter Insertion Performed by: [name], MD, anesthesiologist Start/End3/13/2020 8:15 AM, 03/21/2018 8:30 AM Patient location: Pre-op. Preanesthetic checklist: patient identified, IV checked, site marked, risks and benefits discussed, surgical consent, monitors and equipment checked, pre-op evaluation, timeout performed and anesthesia consent Position: Trendelenburg Lidocaine 1% used for infiltration and patient sedated Hand hygiene performed , maximum sterile barriers used  and Seldinger technique used Catheter size: 9 Fr Total catheter length 10. Central line was placed.MAC introducer Procedure performed using ultrasound guided technique. Ultrasound Notes:anatomy identified, needle tip was noted to be adjacent to the nerve/plexus identified, no ultrasound evidence of intravascular and/or intraneural injection and image(s) printed for medical record Attempts: 1 Following insertion, line sutured, dressing applied and Biopatch. Post procedure assessment: blood return through all ports, free fluid flow and no air  Patient tolerated the procedure well with no immediate complications.

## 2018-03-21 NOTE — Transfer of Care (Signed)
Immediate Anesthesia Transfer of Care Note  Patient: Wanda Nelson  Procedure(s) Performed: TRANSESOPHAGEAL ECHOCARDIOGRAM (TEE) (N/A ) Excision Of Atrial Myxoma (N/A Chest) Clipping Of Atrial Appendage - using an AtriCure 47mm clip (Chest)  Patient Location: SICU  Anesthesia Type:General  Level of Consciousness: Patient remains intubated per anesthesia plan  Airway & Oxygen Therapy: Patient remains intubated per anesthesia plan and Patient placed on Ventilator (see vital sign flow sheet for setting)  Post-op Assessment: Report given to RN and Post -op Vital signs reviewed and stable  Post vital signs: Reviewed and stable  Last Vitals:  Vitals Value Taken Time  BP    Temp    Pulse 79 03/21/2018  5:27 PM  Resp    SpO2 100 % 03/21/2018  5:27 PM  Vitals shown include unvalidated device data.  Last Pain:  Vitals:   03/21/18 0512  TempSrc: Oral  PainSc:          Complications: No apparent anesthesia complications

## 2018-03-21 NOTE — Anesthesia Procedure Notes (Signed)
Procedure Name: Intubation Date/Time: 03/21/2018 5:06 PM Performed by: Clearnce Sorrel, CRNA Pre-anesthesia Checklist: Patient identified, Emergency Drugs available, Suction available, Patient being monitored and Timeout performed Patient Re-evaluated:Patient Re-evaluated prior to induction Oxygen Delivery Method: Circle system utilized Preoxygenation: Pre-oxygenation with 100% oxygen Laryngoscope Size: Glidescope and 4 Grade View: Grade I Tube size: 8.0 mm Number of attempts: 1 Airway Equipment and Method: Bougie stylet Placement Confirmation: ETT inserted through vocal cords under direct vision,  positive ETCO2 and breath sounds checked- equal and bilateral Secured at: 22 cm Tube secured with: Tape Dental Injury: Teeth and Oropharynx as per pre-operative assessment  Comments: DBLT exchanged at end of case for single lumen 8.0 ett with glidescope 4.

## 2018-03-21 NOTE — Progress Notes (Signed)
Dr. Roxy Manns called unit to get updates on pt. Updated on pt condition, including BP, PAP, CI/CO, and meds.   No new orders at this time. Will continue to monitor.

## 2018-03-21 NOTE — Brief Op Note (Signed)
03/19/2018 - 03/21/2018  4:36 PM  PATIENT:  Wanda Nelson  85 y.o. female  PRE-OPERATIVE DIAGNOSIS:  MYXOMA  POST-OPERATIVE DIAGNOSIS:  MYXOMA  PROCEDURE:  Procedure(s): TRANSESOPHAGEAL ECHOCARDIOGRAM (TEE) (N/A) Excision Of Atrial Myxoma (N/A) Clipping Of Atrial Appendage - using an AtriCure 11mm clip  SURGEON:  Surgeon(s) and Role:    Rexene Alberts, MD - Primary  PHYSICIAN ASSISTANT:  Nicholes Rough, PA-C   ANESTHESIA:   general  EBL:  1300 mL   BLOOD ADMINISTERED:none  DRAINS: ROUTINE   LOCAL MEDICATIONS USED:  OTHER On-Q pump  SPECIMEN:  Source of Specimen:  left atrial mass  DISPOSITION OF SPECIMEN:  PATHOLOGY  COUNTS:  YES  DICTATION: .Dragon Dictation  PLAN OF CARE: Admit to inpatient   PATIENT DISPOSITION:  ICU - intubated and hemodynamically stable.   Delay start of Pharmacological VTE agent (>24hrs) due to surgical blood loss or risk of bleeding: yes

## 2018-03-21 NOTE — Anesthesia Procedure Notes (Signed)
Procedure Name: Intubation Date/Time: 03/21/2018 9:18 AM Performed by: Clearnce Sorrel, CRNA Pre-anesthesia Checklist: Patient identified, Emergency Drugs available, Suction available, Patient being monitored and Timeout performed Patient Re-evaluated:Patient Re-evaluated prior to induction Oxygen Delivery Method: Circle system utilized Preoxygenation: Pre-oxygenation with 100% oxygen Induction Type: IV induction Ventilation: Mask ventilation without difficulty Laryngoscope Size: Mac and 3 Grade View: Grade I Tube type: Oral Endobronchial tube: Double lumen EBT and 37 Fr Number of attempts: 1 Airway Equipment and Method: Stylet Placement Confirmation: ETT inserted through vocal cords under direct vision,  positive ETCO2 and breath sounds checked- equal and bilateral Tube secured with: Tape Dental Injury: Teeth and Oropharynx as per pre-operative assessment

## 2018-03-22 ENCOUNTER — Inpatient Hospital Stay (HOSPITAL_COMMUNITY): Payer: Medicare Other

## 2018-03-22 ENCOUNTER — Other Ambulatory Visit: Payer: Self-pay

## 2018-03-22 LAB — POCT I-STAT 7, (LYTES, BLD GAS, ICA,H+H)
ACID-BASE DEFICIT: 5 mmol/L — AB (ref 0.0–2.0)
Acid-base deficit: 3 mmol/L — ABNORMAL HIGH (ref 0.0–2.0)
Acid-base deficit: 2 mmol/L (ref 0.0–2.0)
BICARBONATE: 20.4 mmol/L (ref 20.0–28.0)
Bicarbonate: 20.3 mmol/L (ref 20.0–28.0)
Bicarbonate: 22.3 mmol/L (ref 20.0–28.0)
Calcium, Ion: 1.04 mmol/L — ABNORMAL LOW (ref 1.15–1.40)
Calcium, Ion: 1.13 mmol/L — ABNORMAL LOW (ref 1.15–1.40)
Calcium, Ion: 1.07 mmol/L — ABNORMAL LOW (ref 1.15–1.40)
HCT: 28 % — ABNORMAL LOW (ref 36.0–46.0)
HCT: 29 % — ABNORMAL LOW (ref 36.0–46.0)
HCT: 28 % — ABNORMAL LOW (ref 36.0–46.0)
HEMOGLOBIN: 9.5 g/dL — AB (ref 12.0–15.0)
Hemoglobin: 9.5 g/dL — ABNORMAL LOW (ref 12.0–15.0)
Hemoglobin: 9.9 g/dL — ABNORMAL LOW (ref 12.0–15.0)
O2 Saturation: 100 %
O2 Saturation: 100 %
O2 Saturation: 99 %
PH ART: 7.363 (ref 7.350–7.450)
PO2 ART: 181 mmHg — AB (ref 83.0–108.0)
Patient temperature: 36.6
Potassium: 4.2 mmol/L (ref 3.5–5.1)
Potassium: 3.8 mmol/L (ref 3.5–5.1)
Potassium: 4.1 mmol/L (ref 3.5–5.1)
Sodium: 142 mmol/L (ref 135–145)
Sodium: 140 mmol/L (ref 135–145)
Sodium: 142 mmol/L (ref 135–145)
TCO2: 21 mmol/L — AB (ref 22–32)
TCO2: 21 mmol/L — ABNORMAL LOW (ref 22–32)
TCO2: 23 mmol/L (ref 22–32)
pCO2 arterial: 35.6 mmHg (ref 32.0–48.0)
pCO2 arterial: 30.7 mmHg — ABNORMAL LOW (ref 32.0–48.0)
pCO2 arterial: 34.7 mmHg (ref 32.0–48.0)
pH, Arterial: 7.431 (ref 7.350–7.450)
pH, Arterial: 7.416 (ref 7.350–7.450)
pO2, Arterial: 225 mmHg — ABNORMAL HIGH (ref 83.0–108.0)
pO2, Arterial: 160 mmHg — ABNORMAL HIGH (ref 83.0–108.0)

## 2018-03-22 LAB — BASIC METABOLIC PANEL
Anion gap: 3 — ABNORMAL LOW (ref 5–15)
Anion gap: 6 (ref 5–15)
Anion gap: 4 — ABNORMAL LOW (ref 5–15)
BUN: 14 mg/dL (ref 8–23)
BUN: 16 mg/dL (ref 8–23)
BUN: 16 mg/dL (ref 8–23)
CO2: 20 mmol/L — ABNORMAL LOW (ref 22–32)
CO2: 20 mmol/L — ABNORMAL LOW (ref 22–32)
CO2: 22 mmol/L (ref 22–32)
Calcium: 6.7 mg/dL — ABNORMAL LOW (ref 8.9–10.3)
Calcium: 7.2 mg/dL — ABNORMAL LOW (ref 8.9–10.3)
Calcium: 7.4 mg/dL — ABNORMAL LOW (ref 8.9–10.3)
Chloride: 117 mmol/L — ABNORMAL HIGH (ref 98–111)
Chloride: 114 mmol/L — ABNORMAL HIGH (ref 98–111)
Chloride: 114 mmol/L — ABNORMAL HIGH (ref 98–111)
Creatinine, Ser: 0.74 mg/dL (ref 0.44–1.00)
Creatinine, Ser: 0.77 mg/dL (ref 0.44–1.00)
Creatinine, Ser: 0.73 mg/dL (ref 0.44–1.00)
GFR calc non Af Amer: >60 mL/min (ref >60)
Glucose, Bld: 135 mg/dL — ABNORMAL HIGH (ref 70–99)
Glucose, Bld: 133 mg/dL — ABNORMAL HIGH (ref 70–99)
Glucose, Bld: 131 mg/dL — ABNORMAL HIGH (ref 70–99)
Potassium: 4.6 mmol/L (ref 3.5–5.1)
Potassium: 4.3 mmol/L (ref 3.5–5.1)
Potassium: 3.8 mmol/L (ref 3.5–5.1)
SODIUM: 140 mmol/L (ref 135–145)
SODIUM: 140 mmol/L (ref 135–145)
Sodium: 140 mmol/L (ref 135–145)

## 2018-03-22 LAB — PREPARE PLATELET PHERESIS
Unit division: 0
Unit division: 0

## 2018-03-22 LAB — BPAM FFP
Blood Product Expiration Date: 202003172359
Blood Product Expiration Date: 202003182359
ISSUE DATE / TIME: 202003131428
ISSUE DATE / TIME: 202003131428
Unit Type and Rh: 600
Unit Type and Rh: 6200

## 2018-03-22 LAB — PREPARE FRESH FROZEN PLASMA: Unit division: 0

## 2018-03-22 LAB — CBC
HCT: 33.6 % — ABNORMAL LOW (ref 36.0–46.0)
HCT: 32.7 % — ABNORMAL LOW (ref 36.0–46.0)
HCT: 29.2 % — ABNORMAL LOW (ref 36.0–46.0)
HEMOGLOBIN: 11.3 g/dL — AB (ref 12.0–15.0)
Hemoglobin: 10.8 g/dL — ABNORMAL LOW (ref 12.0–15.0)
Hemoglobin: 9.7 g/dL — ABNORMAL LOW (ref 12.0–15.0)
MCH: 29.2 pg (ref 26.0–34.0)
MCH: 28.7 pg (ref 26.0–34.0)
MCH: 29.3 pg (ref 26.0–34.0)
MCHC: 33.6 g/dL (ref 30.0–36.0)
MCHC: 33 g/dL (ref 30.0–36.0)
MCHC: 33.2 g/dL (ref 30.0–36.0)
MCV: 86.8 fL (ref 80.0–100.0)
MCV: 87 fL (ref 80.0–100.0)
MCV: 88.2 fL (ref 80.0–100.0)
Platelets: 115 10*3/uL — ABNORMAL LOW (ref 150–400)
Platelets: 101 10*3/uL — ABNORMAL LOW (ref 150–400)
Platelets: 99 10*3/uL — ABNORMAL LOW (ref 150–400)
RBC: 3.87 MIL/uL (ref 3.87–5.11)
RBC: 3.76 MIL/uL — ABNORMAL LOW (ref 3.87–5.11)
RBC: 3.31 MIL/uL — ABNORMAL LOW (ref 3.87–5.11)
RDW: 15.3 % (ref 11.5–15.5)
RDW: 15.5 % (ref 11.5–15.5)
RDW: 15.9 % — ABNORMAL HIGH (ref 11.5–15.5)
WBC: 12.1 10*3/uL — ABNORMAL HIGH (ref 4.0–10.5)
WBC: 9.9 10*3/uL (ref 4.0–10.5)
WBC: 13.7 10*3/uL — ABNORMAL HIGH (ref 4.0–10.5)
nRBC: 0 % (ref 0.0–0.2)
nRBC: 0 % (ref 0.0–0.2)
nRBC: 0 % (ref 0.0–0.2)

## 2018-03-22 LAB — BPAM PLATELET PHERESIS
Blood Product Expiration Date: 202003142359
Blood Product Expiration Date: 202003152359
ISSUE DATE / TIME: 202003131437
ISSUE DATE / TIME: 202003131437
Unit Type and Rh: 6200
Unit Type and Rh: 9500

## 2018-03-22 LAB — GLUCOSE, CAPILLARY
GLUCOSE-CAPILLARY: 113 mg/dL — AB (ref 70–99)
GLUCOSE-CAPILLARY: 147 mg/dL — AB (ref 70–99)
Glucose-Capillary: 110 mg/dL — ABNORMAL HIGH (ref 70–99)
Glucose-Capillary: 117 mg/dL — ABNORMAL HIGH (ref 70–99)
Glucose-Capillary: 117 mg/dL — ABNORMAL HIGH (ref 70–99)
Glucose-Capillary: 119 mg/dL — ABNORMAL HIGH (ref 70–99)
Glucose-Capillary: 120 mg/dL — ABNORMAL HIGH (ref 70–99)
Glucose-Capillary: 121 mg/dL — ABNORMAL HIGH (ref 70–99)
Glucose-Capillary: 122 mg/dL — ABNORMAL HIGH (ref 70–99)
Glucose-Capillary: 125 mg/dL — ABNORMAL HIGH (ref 70–99)
Glucose-Capillary: 129 mg/dL — ABNORMAL HIGH (ref 70–99)
Glucose-Capillary: 132 mg/dL — ABNORMAL HIGH (ref 70–99)
Glucose-Capillary: 135 mg/dL — ABNORMAL HIGH (ref 70–99)
Glucose-Capillary: 154 mg/dL — ABNORMAL HIGH (ref 70–99)
Glucose-Capillary: 97 mg/dL (ref 70–99)

## 2018-03-22 LAB — APTT: aPTT: 46 seconds — ABNORMAL HIGH (ref 24–36)

## 2018-03-22 LAB — PROTIME-INR
INR: 1.4 — ABNORMAL HIGH (ref 0.8–1.2)
Prothrombin Time: 16.8 seconds — ABNORMAL HIGH (ref 11.4–15.2)

## 2018-03-22 LAB — MAGNESIUM
MAGNESIUM: 3 mg/dL — AB (ref 1.7–2.4)
Magnesium: 3.2 mg/dL — ABNORMAL HIGH (ref 1.7–2.4)
Magnesium: 2.5 mg/dL — ABNORMAL HIGH (ref 1.7–2.4)

## 2018-03-22 MED ORDER — SODIUM CHLORIDE 0.9% FLUSH
10.0000 mL | Freq: Two times a day (BID) | INTRAVENOUS | Status: DC
  Administered 2018-03-22: 10 mL
  Administered 2018-03-22: 20 mL
  Administered 2018-03-23 – 2018-03-25 (×4): 10 mL

## 2018-03-22 MED ORDER — ENOXAPARIN SODIUM 30 MG/0.3ML ~~LOC~~ SOLN: 30.0000 mg | Freq: Every day | SUBCUTANEOUS | Status: DC

## 2018-03-22 MED ORDER — ORAL CARE MOUTH RINSE
15.0000 mL | Freq: Two times a day (BID) | OROMUCOSAL | Status: DC
  Administered 2018-03-22 – 2018-03-26 (×5): 15 mL via OROMUCOSAL

## 2018-03-22 MED ORDER — HYDRALAZINE HCL 20 MG/ML IJ SOLN: 5.0000 mg | INTRAMUSCULAR | Status: DC | PRN

## 2018-03-22 MED ORDER — FUROSEMIDE 10 MG/ML IJ SOLN
20.0000 mg | Freq: Two times a day (BID) | INTRAMUSCULAR | Status: AC
  Administered 2018-03-22 – 2018-03-24 (×6): 20 mg via INTRAVENOUS
  Filled 2018-03-22 (×6): qty 2

## 2018-03-22 MED ORDER — CHLORHEXIDINE GLUCONATE CLOTH 2 % EX PADS
6.0000 | MEDICATED_PAD | Freq: Every day | CUTANEOUS | Status: DC
  Administered 2018-03-22 – 2018-03-29 (×5): 6 via TOPICAL

## 2018-03-22 MED ORDER — SODIUM CHLORIDE 0.9% FLUSH: 10.0000 mL | INTRAVENOUS | Status: DC | PRN

## 2018-03-22 MED ORDER — INSULIN ASPART 100 UNIT/ML ~~LOC~~ SOLN
0.0000 [IU] | SUBCUTANEOUS | Status: DC
  Administered 2018-03-22 (×3): 2 [IU] via SUBCUTANEOUS

## 2018-03-22 NOTE — Progress Notes (Signed)
      [address]       [place] [address]             [phone]      POD # 1   Extubated this AM Atrial paced at 80, HR in 40s underneath BP 140/67   Pulse 80   Temp 97.8 F (36.6 C) (Oral)   Resp 18   Ht 5\' 2"  (1.575 m)   Wt 72.2 kg   SpO2 100%   BMI 29.11 kg/m   Intake/Output Summary (Last 24 hours) at 03/22/2018 1724 Last data filed at 03/22/2018 1600 Gross per 24 hour  Intake 4202.72 ml  Output 2885 ml  Net 1317.72 ml   BP elevated- will order PRN hydralazine PM labs pending Ready to get up to chair  Remo Lipps C. Roxan Hockey, MD [facility] [phone]

## 2018-03-22 NOTE — Plan of Care (Signed)
  Problem: Education: Goal: Knowledge of General Education information will improve Description: Including pain rating scale, medication(s)/side effects and non-pharmacologic comfort measures Outcome: Progressing   Problem: Health Behavior/Discharge Planning: Goal: Ability to manage health-related needs will improve Outcome: Progressing   Problem: Clinical Measurements: Goal: Ability to maintain clinical measurements within normal limits will improve Outcome: Progressing Goal: Will remain free from infection Outcome: Progressing Goal: Diagnostic test results will improve Outcome: Progressing Goal: Respiratory complications will improve Outcome: Progressing Goal: Cardiovascular complication will be avoided Outcome: Progressing   Problem: Activity: Goal: Risk for activity intolerance will decrease Outcome: Progressing   Problem: Nutrition: Goal: Adequate nutrition will be maintained Outcome: Progressing   Problem: Coping: Goal: Level of anxiety will decrease Outcome: Progressing   Problem: Elimination: Goal: Will not experience complications related to bowel motility Outcome: Progressing Goal: Will not experience complications related to urinary retention Outcome: Progressing   Problem: Pain Managment: Goal: General experience of comfort will improve Outcome: Progressing   Problem: Safety: Goal: Ability to remain free from injury will improve Outcome: Progressing   Problem: Skin Integrity: Goal: Risk for impaired skin integrity will decrease Outcome: Progressing   Problem: Education: Goal: Understanding of CV disease, CV risk reduction, and recovery process will improve Outcome: Progressing Goal: Individualized Educational Video(s) Outcome: Progressing   Problem: Activity: Goal: Ability to return to baseline activity level will improve Outcome: Progressing   Problem: Cardiovascular: Goal: Ability to achieve and maintain adequate cardiovascular perfusion  will improve Outcome: Progressing Goal: Vascular access site(s) Level 0-1 will be maintained Outcome: Progressing   Problem: Health Behavior/Discharge Planning: Goal: Ability to safely manage health-related needs after discharge will improve Outcome: Progressing   Problem: Education: Goal: Will demonstrate proper wound care and an understanding of methods to prevent future damage Outcome: Progressing Goal: Knowledge of disease or condition will improve Outcome: Progressing Goal: Knowledge of the prescribed therapeutic regimen will improve Outcome: Progressing Goal: Individualized Educational Video(s) Outcome: Progressing   Problem: Activity: Goal: Risk for activity intolerance will decrease Outcome: Progressing   Problem: Cardiac: Goal: Will achieve and/or maintain hemodynamic stability Outcome: Progressing   Problem: Clinical Measurements: Goal: Postoperative complications will be avoided or minimized Outcome: Progressing   Problem: Respiratory: Goal: Respiratory status will improve Outcome: Progressing   Problem: Skin Integrity: Goal: Wound healing without signs and symptoms of infection Outcome: Progressing Goal: Risk for impaired skin integrity will decrease Outcome: Progressing   Problem: Urinary Elimination: Goal: Ability to achieve and maintain adequate renal perfusion and functioning will improve Outcome: Progressing   

## 2018-03-22 NOTE — Procedures (Signed)
Extubation Procedure Note  Patient Details:   Name: Wanda Nelson DOB: 05/13/1931 MRN: [id number]   Airway Documentation:    Vent end date: (not recorded) Vent end time: (not recorded)   Evaluation  O2 sats: stable throughout Complications: No apparent complications Patient did tolerate procedure well. Bilateral Breath Sounds: Diminished, Clear   Yes   RT extubated patient per rapid wean protocol per MD to 4L [place] with RN at bedside. Positive cuff leak noted. NIF -25 and VC 1L. No stridor noted. Patient tolerating well at this time. RN working with patient on IS. RT will continue to monitor as needed.   Vernona Rieger 03/22/2018, 11:33 AM

## 2018-03-22 NOTE — Progress Notes (Signed)
Called Dr. Roxan Hockey regarding concern for pt's very swollen mouth and tongue. MD said to leave her intubated overnight. No further orders. Will continue to monitor closely.

## 2018-03-22 NOTE — Progress Notes (Signed)
1 Day Post-Op Procedure(s) (LRB): TRANSESOPHAGEAL ECHOCARDIOGRAM (TEE) (N/A) Excision Of Atrial Myxoma (N/A) Clipping Of Atrial Appendage - using an AtriCure 52mm clip Subjective: Not extubated last night due to tongue swelling Alert, mild pain  Objective: Vital signs in last 24 hours: Temp:  [92.8 F (33.8 C)-99 F (37.2 C)] 98.6 F (37 C) (03/14 0841) Pulse Rate:  [80-90] 90 (03/14 0841) Cardiac Rhythm: Atrial paced (03/14 0800) Resp:  [10-30] 21 (03/14 0841) BP: (105-133)/(64-92) 115/64 (03/14 0841) SpO2:  [99 %-100 %] 99 % (03/14 0841) Arterial Line BP: (104-139)/(60-88) 111/60 (03/14 0800) FiO2 (%):  [50 %] 50 % (03/14 0841) Weight:  [72.2 kg] 72.2 kg (03/14 0600)  Hemodynamic parameters for last 24 hours: PAP: (3-31)/(0-18) 3/0 CO:  [2 L/min-3.2 L/min] 2.9 L/min CI:  [1.3 L/min/m2-2.1 L/min/m2] 1.9 L/min/m2  Intake/Output from previous day: 03/13 0701 - 03/14 0700 In: 8645 [I.V.:4489; UXLKG:4010; NG/GT:170; IV Piggyback:2050.1] Out: 2725 [DGUYQ:0347; Emesis/NG output:100; Blood:1300; Chest Tube:510] Intake/Output this shift: Total I/O In: 40 [I.V.:40] Out: 90 [Urine:60; Chest Tube:30]  General appearance: alert, cooperative and no distress Neurologic: intact Heart: regular rate and rhythm Lungs: diminished breath sounds bibasilar Abdomen: normal findings: soft, non-tender  Lab Results: Recent Labs    03/21/18 2310 03/22/18 0409 03/22/18 0514  WBC 12.1* 9.9  --   HGB 11.3* 10.8* 9.5*  HCT 33.6* 32.7* 28.0*  PLT 115* 101*  --    BMET:  Recent Labs    03/21/18 2310 03/22/18 0409 03/22/18 0514  NA 140 140 142  K 4.6 4.3 4.2  CL 117* 114*  --   CO2 20* 20*  --   GLUCOSE 135* 133*  --   BUN 14 16  --   CREATININE 0.74 0.77  --   CALCIUM 6.7* 7.2*  --     PT/INR:  Recent Labs    03/21/18 2310  LABPROT 16.8*  INR 1.4*   ABG    Component Value Date/Time   PHART 7.363 03/22/2018 0514   HCO3 20.3 03/22/2018 0514   TCO2 21 (L) 03/22/2018 0514    ACIDBASEDEF 5.0 (H) 03/22/2018 0514   O2SAT 100.0 03/22/2018 0514   CBG (last 3)  Recent Labs    03/22/18 0512 03/22/18 0605 03/22/18 0828  GLUCAP 110* 117* 135*    Assessment/Plan: S/P Procedure(s) (LRB): TRANSESOPHAGEAL ECHOCARDIOGRAM (TEE) (N/A) Excision Of Atrial Myxoma (N/A) Clipping Of Atrial Appendage - using an AtriCure 26mm clip -CV- good hemodynamics, dc Swan, A line RESP- wean vent RENAL- creatinine and lytes OK- diurese ENDO- CBG well controlled- SSI Q4 Anemia- Hct 28 post transfusion Dc mediastinal tube, leave pleural tubes in place Mobilize after extubation    LOS: 3 days    Melrose Nakayama 03/22/2018

## 2018-03-23 ENCOUNTER — Inpatient Hospital Stay (HOSPITAL_COMMUNITY): Payer: Medicare Other

## 2018-03-23 LAB — BASIC METABOLIC PANEL
ANION GAP: 6 (ref 5–15)
BUN: 19 mg/dL (ref 8–23)
CO2: 25 mmol/L (ref 22–32)
Calcium: 7.9 mg/dL — ABNORMAL LOW (ref 8.9–10.3)
Chloride: 110 mmol/L (ref 98–111)
Creatinine, Ser: 0.74 mg/dL (ref 0.44–1.00)
GFR calc Af Amer: >60 mL/min (ref >60)
GFR calc non Af Amer: >60 mL/min (ref >60)
Glucose, Bld: 106 mg/dL — ABNORMAL HIGH (ref 70–99)
Potassium: 3.8 mmol/L (ref 3.5–5.1)
Sodium: 141 mmol/L (ref 135–145)

## 2018-03-23 LAB — CBC
HCT: 28.1 % — ABNORMAL LOW (ref 36.0–46.0)
Hemoglobin: 9.2 g/dL — ABNORMAL LOW (ref 12.0–15.0)
MCH: 29.6 pg (ref 26.0–34.0)
MCHC: 32.7 g/dL (ref 30.0–36.0)
MCV: 90.4 fL (ref 80.0–100.0)
Platelets: 91 10*3/uL — ABNORMAL LOW (ref 150–400)
RBC: 3.11 MIL/uL — ABNORMAL LOW (ref 3.87–5.11)
RDW: 16.1 % — ABNORMAL HIGH (ref 11.5–15.5)
WBC: 15.3 10*3/uL — ABNORMAL HIGH (ref 4.0–10.5)
nRBC: 0 % (ref 0.0–0.2)

## 2018-03-23 LAB — GLUCOSE, CAPILLARY
Glucose-Capillary: 104 mg/dL — ABNORMAL HIGH (ref 70–99)
Glucose-Capillary: 110 mg/dL — ABNORMAL HIGH (ref 70–99)
Glucose-Capillary: 105 mg/dL — ABNORMAL HIGH (ref 70–99)
Glucose-Capillary: 104 mg/dL — ABNORMAL HIGH (ref 70–99)

## 2018-03-23 MED ORDER — INSULIN ASPART 100 UNIT/ML ~~LOC~~ SOLN: 0.0000 [IU] | Freq: Three times a day (TID) | SUBCUTANEOUS | Status: DC

## 2018-03-23 MED ORDER — POTASSIUM CHLORIDE 10 MEQ/50ML IV SOLN
10.0000 meq | INTRAVENOUS | Status: AC
  Administered 2018-03-23 (×3): 10 meq via INTRAVENOUS
  Filled 2018-03-23 (×3): qty 50

## 2018-03-23 NOTE — Progress Notes (Signed)
      [address]       [place] [address]             [phone]      Resting comfortably Ambulated twice today BP (!) 101/55   Pulse 80   Temp 97.9 F (36.6 C)   Resp (!) 24   Ht 5\' 2"  (1.575 m)   Wt 63 kg   SpO2 95%   BMI 25.40 kg/m   I/O 689/1125 so far today  CBG well controlled  Remo Lipps C. Roxan Hockey, MD [facility] [phone]

## 2018-03-23 NOTE — Progress Notes (Signed)
2 Days Post-Op Procedure(s) (LRB): TRANSESOPHAGEAL ECHOCARDIOGRAM (TEE) (N/A) Excision Of Atrial Myxoma (N/A) Clipping Of Atrial Appendage - using an AtriCure 50mm clip Subjective: In good spirits this AM Some nausea, not severe, pain in right shoulder(chest tubes)  Objective: Vital signs in last 24 hours: Temp:  [97.6 F (36.4 C)-99 F (37.2 C)] 97.8 F (36.6 C) (03/15 0300) Pulse Rate:  [80-90] 80 (03/15 0700) Cardiac Rhythm: Atrial paced (03/15 0400) Resp:  [0-40] 0 (03/15 0700) BP: (106-147)/(50-94) 126/62 (03/15 0700) SpO2:  [88 %-100 %] 100 % (03/15 0700) Arterial Line BP: (113-143)/(61-74) 143/74 (03/14 1200) FiO2 (%):  [40 %-50 %] 40 % (03/14 1027) Weight:  [63 kg] 63 kg (03/15 0500)  Hemodynamic parameters for last 24 hours: PAP: (18-20)/(13-14) 18/13  Intake/Output from previous day: 03/14 0701 - 03/15 0700 In: 682.9 [P.O.:120; I.V.:352.2; NG/GT:100; IV Piggyback:110.8] Out: 2930 [Urine:2320; Chest Tube:610] Intake/Output this shift: No intake/output data recorded.  General appearance: alert, cooperative and no distress Neurologic: intact Heart: regular rate and rhythm and Paced Lungs: diminished breath sounds bibasilar Abdomen: normal findings: soft, non-tender  Lab Results: Recent Labs    03/22/18 1656 03/23/18 0322  WBC 13.7* 15.3*  HGB 9.7* 9.2*  HCT 29.2* 28.1*  PLT 99* 91*   BMET:  Recent Labs    03/22/18 1656 03/23/18 0322  NA 140 141  K 3.8 3.8  CL 114* 110  CO2 22 25  GLUCOSE 131* 106*  BUN 16 19  CREATININE 0.73 0.74  CALCIUM 7.4* 7.9*    PT/INR:  Recent Labs    03/21/18 2310  LABPROT 16.8*  INR 1.4*   ABG    Component Value Date/Time   PHART 7.416 03/22/2018 1225   HCO3 22.3 03/22/2018 1225   TCO2 23 03/22/2018 1225   ACIDBASEDEF 2.0 03/22/2018 1225   O2SAT 99.0 03/22/2018 1225   CBG (last 3)  Recent Labs    03/22/18 1957 03/22/18 2341 03/23/18 0309  GLUCAP 147* 132* 104*    Assessment/Plan: S/P Procedure(s)  (LRB): TRANSESOPHAGEAL ECHOCARDIOGRAM (TEE) (N/A) Excision Of Atrial Myxoma (N/A) Clipping Of Atrial Appendage - using an AtriCure 23mm clip -CV- remains bradycardic under pacer- will dc lopressor RESP- continue IS RENAL- creatinine and lytes normal, continue IV lasix today  Leave Foley in for IV diuresis  Supplement K IV  ENDO- CBG well controlled- change to AC and HS Anemia secondary to ABL- mild, stable Thrombocytopenia- PLT down slightly to 91K- will dc enoxaparin, continue SCD Cardiac rehab Keep pleural tubes for now   LOS: 4 days    Wanda Nelson 03/23/2018

## 2018-03-24 ENCOUNTER — Encounter (HOSPITAL_COMMUNITY): Payer: Self-pay | Admitting: Thoracic Surgery (Cardiothoracic Vascular Surgery)

## 2018-03-24 ENCOUNTER — Inpatient Hospital Stay (HOSPITAL_COMMUNITY): Payer: Medicare Other

## 2018-03-24 LAB — CBC
HCT: 26.8 % — ABNORMAL LOW (ref 36.0–46.0)
Hemoglobin: 8.5 g/dL — ABNORMAL LOW (ref 12.0–15.0)
MCH: 28.6 pg (ref 26.0–34.0)
MCHC: 31.7 g/dL (ref 30.0–36.0)
MCV: 90.2 fL (ref 80.0–100.0)
Platelets: 82 10*3/uL — ABNORMAL LOW (ref 150–400)
RBC: 2.97 MIL/uL — ABNORMAL LOW (ref 3.87–5.11)
RDW: 15.8 % — ABNORMAL HIGH (ref 11.5–15.5)
WBC: 12.5 10*3/uL — ABNORMAL HIGH (ref 4.0–10.5)
nRBC: 0 % (ref 0.0–0.2)

## 2018-03-24 LAB — POCT I-STAT 4, (NA,K, GLUC, HGB,HCT)
GLUCOSE: 127 mg/dL — AB (ref 70–99)
Glucose, Bld: 107 mg/dL — ABNORMAL HIGH (ref 70–99)
Glucose, Bld: 113 mg/dL — ABNORMAL HIGH (ref 70–99)
Glucose, Bld: 98 mg/dL (ref 70–99)
Glucose, Bld: 130 mg/dL — ABNORMAL HIGH (ref 70–99)
Glucose, Bld: 126 mg/dL — ABNORMAL HIGH (ref 70–99)
Glucose, Bld: 120 mg/dL — ABNORMAL HIGH (ref 70–99)
Glucose, Bld: 140 mg/dL — ABNORMAL HIGH (ref 70–99)
HCT: 35 % — ABNORMAL LOW (ref 36.0–46.0)
HCT: 30 % — ABNORMAL LOW (ref 36.0–46.0)
HCT: 33 % — ABNORMAL LOW (ref 36.0–46.0)
HCT: 27 % — ABNORMAL LOW (ref 36.0–46.0)
HCT: 25 % — ABNORMAL LOW (ref 36.0–46.0)
HCT: 24 % — ABNORMAL LOW (ref 36.0–46.0)
HCT: 15 % — ABNORMAL LOW (ref 36.0–46.0)
HEMATOCRIT: 21 % — AB (ref 36.0–46.0)
HEMOGLOBIN: 11.9 g/dL — AB (ref 12.0–15.0)
Hemoglobin: 10.2 g/dL — ABNORMAL LOW (ref 12.0–15.0)
Hemoglobin: 11.2 g/dL — ABNORMAL LOW (ref 12.0–15.0)
Hemoglobin: 9.2 g/dL — ABNORMAL LOW (ref 12.0–15.0)
Hemoglobin: 8.5 g/dL — ABNORMAL LOW (ref 12.0–15.0)
Hemoglobin: 8.2 g/dL — ABNORMAL LOW (ref 12.0–15.0)
Hemoglobin: 5.1 g/dL — CL (ref 12.0–15.0)
Hemoglobin: 7.1 g/dL — ABNORMAL LOW (ref 12.0–15.0)
POTASSIUM: 3.8 mmol/L (ref 3.5–5.1)
POTASSIUM: 4.8 mmol/L (ref 3.5–5.1)
POTASSIUM: 4.3 mmol/L (ref 3.5–5.1)
Potassium: 3.9 mmol/L (ref 3.5–5.1)
Potassium: 4.1 mmol/L (ref 3.5–5.1)
Potassium: 4.6 mmol/L (ref 3.5–5.1)
Potassium: 4.3 mmol/L (ref 3.5–5.1)
Potassium: 4.4 mmol/L (ref 3.5–5.1)
SODIUM: 137 mmol/L (ref 135–145)
Sodium: 140 mmol/L (ref 135–145)
Sodium: 139 mmol/L (ref 135–145)
Sodium: 139 mmol/L (ref 135–145)
Sodium: 138 mmol/L (ref 135–145)
Sodium: 136 mmol/L (ref 135–145)
Sodium: 139 mmol/L (ref 135–145)
Sodium: 140 mmol/L (ref 135–145)

## 2018-03-24 LAB — TYPE AND SCREEN
ABO/RH(D): O POS
Antibody Screen: NEGATIVE
Unit division: 0
Unit division: 0
Unit division: 0
Unit division: 0
Unit division: 0
Unit division: 0
Unit division: 0

## 2018-03-24 LAB — POCT I-STAT 7, (LYTES, BLD GAS, ICA,H+H)
Acid-Base Excess: 2 mmol/L (ref 0.0–2.0)
Acid-base deficit: 1 mmol/L (ref 0.0–2.0)
Acid-base deficit: 3 mmol/L — ABNORMAL HIGH (ref 0.0–2.0)
BICARBONATE: 25.5 mmol/L (ref 20.0–28.0)
Bicarbonate: 24.2 mmol/L (ref 20.0–28.0)
Bicarbonate: 21.2 mmol/L (ref 20.0–28.0)
Calcium, Ion: 0.97 mmol/L — ABNORMAL LOW (ref 1.15–1.40)
Calcium, Ion: 0.97 mmol/L — ABNORMAL LOW (ref 1.15–1.40)
Calcium, Ion: 0.85 mmol/L — CL (ref 1.15–1.40)
HCT: 16 % — ABNORMAL LOW (ref 36.0–46.0)
HEMATOCRIT: 29 % — AB (ref 36.0–46.0)
HEMATOCRIT: 22 % — AB (ref 36.0–46.0)
Hemoglobin: 9.9 g/dL — ABNORMAL LOW (ref 12.0–15.0)
Hemoglobin: 7.5 g/dL — ABNORMAL LOW (ref 12.0–15.0)
Hemoglobin: 5.4 g/dL — CL (ref 12.0–15.0)
O2 Saturation: 100 %
O2 Saturation: 100 %
O2 Saturation: 100 %
POTASSIUM: 4.1 mmol/L (ref 3.5–5.1)
Potassium: 4.3 mmol/L (ref 3.5–5.1)
Potassium: 4.3 mmol/L (ref 3.5–5.1)
Sodium: 139 mmol/L (ref 135–145)
Sodium: 136 mmol/L (ref 135–145)
Sodium: 139 mmol/L (ref 135–145)
TCO2: 25 mmol/L (ref 22–32)
TCO2: 26 mmol/L (ref 22–32)
TCO2: 22 mmol/L (ref 22–32)
pCO2 arterial: 41.1 mmHg (ref 32.0–48.0)
pCO2 arterial: 32.3 mmHg (ref 32.0–48.0)
pCO2 arterial: 32.4 mmHg (ref 32.0–48.0)
pH, Arterial: 7.377 (ref 7.350–7.450)
pH, Arterial: 7.506 — ABNORMAL HIGH (ref 7.350–7.450)
pH, Arterial: 7.423 (ref 7.350–7.450)
pO2, Arterial: 438 mmHg — ABNORMAL HIGH (ref 83.0–108.0)
pO2, Arterial: 455 mmHg — ABNORMAL HIGH (ref 83.0–108.0)
pO2, Arterial: 380 mmHg — ABNORMAL HIGH (ref 83.0–108.0)

## 2018-03-24 LAB — BASIC METABOLIC PANEL
Anion gap: 6 (ref 5–15)
BUN: 16 mg/dL (ref 8–23)
CO2: 25 mmol/L (ref 22–32)
Calcium: 7.9 mg/dL — ABNORMAL LOW (ref 8.9–10.3)
Chloride: 107 mmol/L (ref 98–111)
Creatinine, Ser: 0.63 mg/dL (ref 0.44–1.00)
GFR calc Af Amer: >60 mL/min (ref >60)
GFR calc non Af Amer: >60 mL/min (ref >60)
Glucose, Bld: 91 mg/dL (ref 70–99)
Potassium: 3.6 mmol/L (ref 3.5–5.1)
Sodium: 138 mmol/L (ref 135–145)

## 2018-03-24 LAB — BPAM RBC
Blood Product Expiration Date: 202004132359
Blood Product Expiration Date: 202004152359
Blood Product Expiration Date: 202004152359
Blood Product Expiration Date: 202004152359
Blood Product Expiration Date: 202004162359
Blood Product Expiration Date: 202004162359
Blood Product Expiration Date: 202004162359
ISSUE DATE / TIME: 202003131436
ISSUE DATE / TIME: 202003131436
ISSUE DATE / TIME: 202003131528
ISSUE DATE / TIME: 202003131528
ISSUE DATE / TIME: 202003131822
Unit Type and Rh: 5100
Unit Type and Rh: 5100
Unit Type and Rh: 5100
Unit Type and Rh: 5100
Unit Type and Rh: 5100
Unit Type and Rh: 5100
Unit Type and Rh: 5100

## 2018-03-24 LAB — GLUCOSE, CAPILLARY
Glucose-Capillary: 157 mg/dL — ABNORMAL HIGH (ref 70–99)
Glucose-Capillary: 107 mg/dL — ABNORMAL HIGH (ref 70–99)

## 2018-03-24 MED ORDER — MOVING RIGHT ALONG BOOK
Freq: Once | Status: AC
  Administered 2018-03-24: 09:00:00

## 2018-03-24 MED ORDER — FUROSEMIDE 40 MG PO TABS
40.0000 mg | ORAL_TABLET | Freq: Two times a day (BID) | ORAL | Status: DC
  Administered 2018-03-25 – 2018-03-29 (×9): 40 mg via ORAL
  Filled 2018-03-24 (×9): qty 1

## 2018-03-24 MED ORDER — POTASSIUM CHLORIDE CRYS ER 20 MEQ PO TBCR
40.0000 meq | EXTENDED_RELEASE_TABLET | Freq: Two times a day (BID) | ORAL | Status: AC
  Administered 2018-03-24 (×2): 40 meq via ORAL
  Filled 2018-03-24 (×2): qty 2

## 2018-03-24 MED ORDER — POTASSIUM CHLORIDE CRYS ER 20 MEQ PO TBCR
20.0000 meq | EXTENDED_RELEASE_TABLET | Freq: Two times a day (BID) | ORAL | Status: DC
  Administered 2018-03-25 – 2018-03-29 (×9): 20 meq via ORAL
  Filled 2018-03-24 (×9): qty 1

## 2018-03-24 MED ORDER — POTASSIUM CHLORIDE 10 MEQ/50ML IV SOLN: 10.0000 meq | INTRAVENOUS | Status: DC

## 2018-03-24 MED FILL — Glutaral Soln 25%: TOPICAL | Qty: 50 | Status: AC

## 2018-03-24 NOTE — Discharge Summary (Signed)
[address]       [place] [address]             [phone]      Physician Discharge Summary  Patient ID: Wanda Nelson MRN: [id number] DOB/AGE: October 30, 1931 85 y.o.  Admit date: 03/19/2018 Discharge date: 04/03/2018  Admission Diagnoses: Patient Active Problem List   Diagnosis Date Noted   Incidental pulmonary nodule, > 32mm and < 60mm 03/20/2018   s/p resection left atrial myxoma 03/19/2018   HLD (hyperlipidemia) 03/15/2015   History of stroke 03/15/2015   Glaucoma 03/15/2015    Discharge Diagnoses:  Principal Problem:   s/p resection left atrial myxoma Active Problems:   Incidental pulmonary nodule, > 65mm and < 19mm   Discharged Condition: good  HPI:   Patient is an 85 year old female with history of anemia, hiatal hernia, hyperlipidemia, and remote history of hemorrhagic stroke who has been referred for surgical consultation to discuss recently discovered left atrial mass consistent with likely atrial myxoma.  Patient has been remarkably healthy and physically active all of her life.  She exercises on a daily basis.  She reports an intermittent persistent dry nonproductive cough off and on for the past year.  Several months ago she began to experience exertional shortness of breath.  Shortness of breath progressed to the point where she occasionally got breathless while talking.  She was evaluated by a pulmonologist and has been diagnosed with possible pulmonary fibrosis based on CT imaging.  She was recently started on an inhaler.  At the family's insistence an echocardiogram was performed to rule out congestive heart failure.  Echocardiogram revealed a large mass appearing to emanate from the intra-atrial septum consistent with likely atrial myxoma.  The patient was admitted to the hospital for diagnostic cardiac catheterization and cardiothoracic surgical consultation was requested.  Patient is married and lives with her husband at the  [facility] retirement community.  The patient's husband is not in good health and she serves as his primary caregiver.  The patient is quite active physically and walks several miles every day.  She participates in regular exercise classes.  She drives an automobile short distances.  She has not had any significant physical limitations until the recent development of symptoms of exertional shortness of breath.  She denies any history of PND, orthopnea, or lower extremity edema.  She is not had any palpitations, dizzy spells, nor syncope.  Shortness of breath is not positional.  She denies any history of chest pain or chest tightness either with activity or at rest.  Appetite is good.  She has no history of fevers.  She reports no transient monocular blindness or transient visual disturbances.  She has not had any other neurologic symptoms and she has no residual neurologic findings from the stroke she suffered 30 years ago.    Hospital Course:   On 03/21/2018 Ms. Wolgamott underwent a resection of a left atrial mass and clipping of the left atrial appendage with Dr. Roxy Manns. She tolerated the procedure well and was transferred to the surgical ICU. She was extubated in a timely manner, however was delayed slightly due to tongue swelling. POD 2 she did have some nausea and some right shoulder pain likely from the chest tubes. She remains bradycardic under the pacer. Will continue the pacer and discontinue lopressor. POD 3 she is having some chest tube pain. She continues to improve with her respiratory status and knows to use her incentive spirometer. She  is ambulating in the halls. We continued her chest tubes due to output. She was stable to transfer to the stepdown unit for continued care. On the floor, she continued to progress. We were able to wean her from her oxygen and she continued to work on her incentive spirometer. She was ambulating in the halls with assistance. She was switched to a backup rate of 50bpm  and was now in NSR, but bradycardic. We continued her chest tubes due to output. Once her chest tubes reached around 200cc/24 hours of output we discontinued them. Her rhythm remained junctional in the 50s-60s and we discontinued her epicardial pacing wires. She continued to ambulate in the halls with limited assistance. Today, she feels well, her incisions are healing well, she is tolerating room air, and she is ready for discharge home with her family. She will eventually return to her nursing facility.    Consults: None  Significant Diagnostic Studies:   CLINICAL DATA:  85 year old female post cardiac surgery with chest tube in place. Subsequent encounter.  EXAM: PORTABLE CHEST 1 VIEW  COMPARISON:  03/23/2018.  FINDINGS: Post median sternotomy/atrial clipping. Prominence right aspect of the mediastinum unchanged.  Right central line removed.  Right-sided chest tube and possible mediastinal drain remain in place. No pneumothorax identified.  Bilateral pleural effusions greater on left. Pulmonary vascular congestion superimposed upon chronic lung changes. Basilar atelectasis suspected. Not able to exclude left base infiltrate. Appearance similar to prior exam.  Calcified aorta.  Cardiomegaly.  Moderately large hiatal hernia. Gas within the abdomen incompletely assessed. Epicardial leads in place.  IMPRESSION: Right central line has been removed. Remainder of findings similar to prior exam as detailed above.   Electronically Signed   By: Genia Del M.D.   On: 03/24/2018 07:31    Treatments:  CARDIOTHORACIC SURGERY OPERATIVE NOTE  Date of Procedure:                03/21/2018  Preoperative Diagnosis:      Left Atrial Mass  Postoperative Diagnosis:    Same  Procedure:        Resection of Left Atrial Mass             Autologous pericardial patch reconstruction of interatrial septum             Clipping of left atrial appendage (Atricure  Pro245 left atrial clip)              Surgeon:        Valentina Gu. Roxy Manns, MD  Assistant:       Nicholes Rough, PA-C  Anesthesia:    Arabella Merles, MD  Operative Findings:  Large benign-appearing left atrial mass eminating from interatrial septum c/w atrial myxoma  Intial minimally-invasive approach via right mini-thoracotomy converted to median sternotomy to repair bleeding from left atrial appendage  Discharge Exam: Blood pressure (!) 123/46, pulse 65, temperature 98 F (36.7 C), temperature source Oral, resp. rate (!) 34, height 5\' 2"  (1.575 m), weight 119 lb 7.8 oz (54.2 kg), SpO2 98 %.   General appearance: alert, cooperative and no distress Heart: regular rate and rhythm Lungs: minow crackles in bases Abdomen: benign Extremities: no edema Wound: incis healing well, mod echymosis   Disposition: Discharge disposition: 01-Home or Self Care       Discharge Instructions    Amb Referral to Cardiac Rehabilitation   Complete by:  As directed    Atrial myoxma and Left Atrial Appendange clip   Diagnosis:  Other  Discharge patient   Complete by:  As directed    Discharge disposition:  01-Home or Self Care   Discharge patient date:  03/29/2018     Allergies as of 03/29/2018      Reactions   Bee Venom Anaphylaxis, Hives   Codeine Other (See Comments)   Altered mental status Altered mental status   Morphine And Related    Altered mental status   Shellfish Allergy Anaphylaxis, Hives   Mainly shrimp   Nitrofurantoin Rash      Medication List    STOP taking these medications   aspirin 81 MG tablet Replaced by:  aspirin 325 MG EC tablet   benzonatate 200 MG capsule Commonly known as:  TESSALON     TAKE these medications   acetaminophen 325 MG tablet Commonly known as:  TYLENOL Take 2 tablets (650 mg total) by mouth every 6 (six) hours as needed for moderate pain.   albuterol 108 (90 Base) MCG/ACT inhaler Commonly known as:  PROVENTIL HFA;VENTOLIN  HFA Inhale 1-2 puffs into the lungs every 6 (six) hours as needed for wheezing or shortness of breath.   aspirin 325 MG EC tablet Take 1 tablet (325 mg total) by mouth daily. Replaces:  aspirin 81 MG tablet   atorvastatin 20 MG tablet Commonly known as:  LIPITOR Take 1 tablet (20 mg total) by mouth daily.   Calcium 500 + D 500-125 MG-UNIT Tabs Generic drug:  Calcium Carbonate-Vitamin D Take 1 tablet by mouth daily.   cholecalciferol 1000 units tablet Commonly known as:  VITAMIN D Take 1,000 Units by mouth daily.   ferrous sulfate 325 (65 FE) MG tablet Commonly known as:  FeroSul Take 2 tablets (650 mg total) by mouth daily with breakfast. MUST SCHEDULE ANNUAL PHYSICAL   Fish Oil 1200 MG Caps Take 2,400 mg by mouth 2 (two) times daily.   Flax Seed Oil 1300 MG Caps Take 1,100 mg by mouth daily.   fluticasone furoate-vilanterol 200-25 MCG/INH Aepb Commonly known as:  Breo Ellipta Inhale 1 puff into the lungs daily. Rinse mouth after use.   multivitamin tablet Take 1 tablet by mouth daily.   omeprazole 20 MG capsule Commonly known as:  PRILOSEC Take 1 capsule (20 mg total) by mouth daily.   psyllium 0.52 g capsule Commonly known as:  REGULOID Take 0.52 g by mouth daily.   Spacer/Aero-Holding [employer] 1 Device by Does not apply route as needed.   Stool Softener 100 MG capsule Generic drug:  Docusate Sodium Take 100 mg by mouth daily.   traMADol 50 MG tablet Commonly known as:  ULTRAM Take 1-2 tablets (50-100 mg total) by mouth every 6 (six) hours as needed for up to 7 days for moderate pain.      Follow-up Information    Theora Gianotti, NP Follow up.   Specialties:  Nurse Practitioner, Cardiology, Radiology Why:    Murray Hodgkins, NP 4/3 @10  am [facility])    Contact information: [place] [address] [phone]        Rexene Alberts, MD Follow up.   Specialty:  Cardiothoracic Surgery Why:   Your routine follow-up appointment is on 4/13 at 2:00pm. PLease arrive at 1:30pm for a chest xray located at [facility] which is on the first floor of our building. Contact information: [address] [place] [address] [phone]        Jearld Fenton, NP. Call in 1 day(s).  Specialties:  Internal Medicine, Emergency Medicine Contact information: [place] [address] [phone]        Wellington Hampshire, MD .   Specialty:  Cardiology Contact information: [place] [address] [phone]          The patient has been discharged on:   1.Beta Blocker:  Yes [   ]                              No   [ no  ]                              If No, reason: bradycardia  2.Ace Inhibitor/ARB: Yes [   ]                                     No  [  no  ]                                     If No, reason: normotensive  3.Statin:   Yes [   ]                  No  [ no  ]                  If No, reason: no CAD  4.Shela CommonsVelta Addison  [  yes ]                  No   [   ]                  If No, reason:    Signed: Elgie Collard 04/03/2018, 8:20 AM

## 2018-03-24 NOTE — Progress Notes (Signed)
CARDIAC REHAB PHASE I   PRE:  Rate/Rhythm: 70 pacing    BP: sitting 97/49    SaO2: 95 RA  MODE:  Ambulation: 150 ft   POST:  Rate/Rhythm: 70 paced    BP: sitting 96 RA     SaO2: 111/44  Pt feeling weak. Able to slowly get up and walk with RW. Slow, weak pace. C/o SOB. She walked 370 ft this am with EVA and O2. Tired after walk. To recliner. Encouraged more walking and IS (200 mL).  [place], ACSM 03/24/2018 11:49 AM

## 2018-03-24 NOTE — Anesthesia Postprocedure Evaluation (Signed)
Anesthesia Post Note  Patient: Wanda Nelson  Procedure(s) Performed: TRANSESOPHAGEAL ECHOCARDIOGRAM (TEE) (N/A ) Excision Of Atrial Myxoma (N/A Chest) Clipping Of Atrial Appendage - using an AtriCure 37mm clip (Chest)     Patient location during evaluation: Other Anesthesia Type: General Level of consciousness: awake and alert Pain management: pain level controlled Vital Signs Assessment: post-procedure vital signs reviewed and stable Respiratory status: spontaneous breathing, nonlabored ventilation, respiratory function stable and patient connected to nasal cannula oxygen Cardiovascular status: blood pressure returned to baseline and stable Postop Assessment: no apparent nausea or vomiting Anesthetic complications: no    Last Vitals:  Vitals:   03/24/18 0600 03/24/18 0630  BP: (!) 104/55   Pulse: 80   Resp: (!) 23   Temp:  36.8 C  SpO2: 100%     Last Pain:  Vitals:   03/24/18 0630  TempSrc: Oral  PainSc:                  Kaelei Wheeler,W. EDMOND

## 2018-03-24 NOTE — Progress Notes (Signed)
Pt received from [place]. VSS. External pacer w/ rate of 70. CT to 20 of suction.  CHG complete. Pt oriented to room and unit. Will continue to monitor.  Clyde Canterbury, RN

## 2018-03-24 NOTE — Progress Notes (Signed)
0856 Attempted to give report to 4E RN. Nurse is currently with a patient and will call me back

## 2018-03-24 NOTE — Progress Notes (Signed)
      [address]       [place] [address]             [phone]      3 Days Post-Op Procedure(s) (LRB): TRANSESOPHAGEAL ECHOCARDIOGRAM (TEE) (N/A) Excision Of Atrial Myxoma (N/A) Clipping Of Atrial Appendage - using an AtriCure 64mm clip Subjective: Feels okay this morning. She is having some chest tube site pain.   Objective: Vital signs in last 24 hours: Temp:  [97.9 F (36.6 C)-98.3 F (36.8 C)] 98.3 F (36.8 C) (03/16 0630) Pulse Rate:  [76-118] 80 (03/16 0725) Cardiac Rhythm: Atrial paced (03/16 0400) Resp:  [0-27] 27 (03/16 0725) BP: (77-156)/(42-100) 100/42 (03/16 0725) SpO2:  [89 %-100 %] 100 % (03/16 0725) Weight:  [61.3 kg] 61.3 kg (03/16 0500)    Intake/Output from previous day: 03/15 0701 - 03/16 0700 In: 689 [P.O.:600; I.V.:25.2; IV Piggyback:63.8] Out: 1925 [Urine:1615; Chest Tube:310] Intake/Output this shift: No intake/output data recorded.  General appearance: alert, cooperative and no distress Heart: regular rate and rhythm, S1, S2 normal, no murmur, click, rub or gallop Lungs: clear to auscultation bilaterally Abdomen: soft, non-tender; bowel sounds normal; no masses,  no organomegaly Extremities: extremities normal, atraumatic, no cyanosis or edema Wound: clean and dry dressed with a sterile dressing  Lab Results: Recent Labs    03/23/18 0322 03/24/18 0448  WBC 15.3* 12.5*  HGB 9.2* 8.5*  HCT 28.1* 26.8*  PLT 91* 82*   BMET:  Recent Labs    03/23/18 0322 03/24/18 0448  NA 141 138  K 3.8 3.6  CL 110 107  CO2 25 25  GLUCOSE 106* 91  BUN 19 16  CREATININE 0.74 0.63  CALCIUM 7.9* 7.9*    PT/INR:  Recent Labs    03/21/18 2310  LABPROT 16.8*  INR 1.4*   ABG    Component Value Date/Time   PHART 7.416 03/22/2018 1225   HCO3 22.3 03/22/2018 1225   TCO2 23 03/22/2018 1225   ACIDBASEDEF 2.0 03/22/2018 1225   O2SAT 99.0 03/22/2018 1225   CBG (last 3)  Recent Labs    03/23/18 1550 03/23/18 2208 03/24/18  0631  GLUCAP 105* 104* 107*    Assessment/Plan: S/P Procedure(s) (LRB): TRANSESOPHAGEAL ECHOCARDIOGRAM (TEE) (N/A) Excision Of Atrial Myxoma (N/A) Clipping Of Atrial Appendage - using an AtriCure 69mm clip  1. CV- NSR in the 80s. BP well controlled. Continue ASA. Lopressor discontinued due to bradycardia.  2. Pulm-bilateral pleural effusions > on the left. Chest tubes in place with 310cc/24 hours.   3. Renal-creatinine 0.63, electrolytes okay. Continue IV lasix for expected fluid overload.  4. H and H 8.5/26.8, expected acute blood loss anemia.  5. Endo-blood glucose well controlled last CBGs 105, 104, 107. A1C is 5.3. Can discontinue blood sugar checks.   Plan: See progression orders. Transfer to 4E for continued care. Encouraged incentive spirometer and weaning of oxygen. Continue diuresis for fluid overload.    LOS: 5 days    Wanda Nelson 03/24/2018

## 2018-03-25 ENCOUNTER — Inpatient Hospital Stay (HOSPITAL_COMMUNITY): Payer: Medicare Other

## 2018-03-25 ENCOUNTER — Telehealth: Payer: Self-pay | Admitting: *Deleted

## 2018-03-25 LAB — BASIC METABOLIC PANEL
Anion gap: 7 (ref 5–15)
BUN: 20 mg/dL (ref 8–23)
CHLORIDE: 107 mmol/L (ref 98–111)
CO2: 25 mmol/L (ref 22–32)
Calcium: 8.1 mg/dL — ABNORMAL LOW (ref 8.9–10.3)
Creatinine, Ser: 0.58 mg/dL (ref 0.44–1.00)
GFR calc Af Amer: >60 mL/min (ref >60)
GFR calc non Af Amer: >60 mL/min (ref >60)
Glucose, Bld: 92 mg/dL (ref 70–99)
Potassium: 4.2 mmol/L (ref 3.5–5.1)
Sodium: 139 mmol/L (ref 135–145)

## 2018-03-25 LAB — CBC
HCT: 25.3 % — ABNORMAL LOW (ref 36.0–46.0)
HEMOGLOBIN: 8.3 g/dL — AB (ref 12.0–15.0)
MCH: 29.6 pg (ref 26.0–34.0)
MCHC: 32.8 g/dL (ref 30.0–36.0)
MCV: 90.4 fL (ref 80.0–100.0)
Platelets: 104 10*3/uL — ABNORMAL LOW (ref 150–400)
RBC: 2.8 MIL/uL — ABNORMAL LOW (ref 3.87–5.11)
RDW: 15.6 % — ABNORMAL HIGH (ref 11.5–15.5)
WBC: 12.7 10*3/uL — ABNORMAL HIGH (ref 4.0–10.5)
nRBC: 0 % (ref 0.0–0.2)

## 2018-03-25 MED ORDER — FE FUMARATE-B12-VIT C-FA-IFC PO CAPS
1.0000 | ORAL_CAPSULE | Freq: Three times a day (TID) | ORAL | Status: DC
  Administered 2018-03-26 – 2018-03-29 (×10): 1 via ORAL
  Filled 2018-03-25 (×10): qty 1

## 2018-03-25 NOTE — Plan of Care (Signed)
  Problem: Clinical Measurements: Goal: Will remain free from infection Outcome: Progressing Goal: Respiratory complications will improve Outcome: Progressing Goal: Cardiovascular complication will be avoided Outcome: Progressing   Problem: Coping: Goal: Level of anxiety will decrease Outcome: Progressing   Problem: Pain Managment: Goal: General experience of comfort will improve Outcome: Progressing

## 2018-03-25 NOTE — Care Management Important Message (Signed)
Important Message  Patient Details  Name: Wanda Nelson MRN: [id number] Date of Birth: [birth year]   Medicare Important Message Given:  Yes    Mehar Sagen P Rilan Eiland 03/25/2018, 2:00 PM

## 2018-03-25 NOTE — Progress Notes (Addendum)
[address]       [place] [address]             [phone]      4 Days Post-Op Procedure(s) (LRB): TRANSESOPHAGEAL ECHOCARDIOGRAM (TEE) (N/A) Excision Of Atrial Myxoma (N/A) Clipping Of Atrial Appendage - using an AtriCure 52mm clip Subjective: Feels okay this morning. Appetite is coming back slowly  Objective: Vital signs in last 24 hours: Temp:  [97.5 F (36.4 C)-98 F (36.7 C)] 98 F (36.7 C) (03/17 0445) Pulse Rate:  [69-80] 70 (03/17 0445) Cardiac Rhythm: Atrial paced (03/17 0700) Resp:  [17-27] 17 (03/17 0445) BP: (97-133)/(44-93) 119/64 (03/17 0445) SpO2:  [96 %-100 %] 99 % (03/17 0445) Weight:  [60.5 kg] 60.5 kg (03/17 0449)     Intake/Output from previous day: 03/16 0701 - 03/17 0700 In: 240 [P.O.:240] Out: 1245 [Urine:925; Chest Tube:320] Intake/Output this shift: No intake/output data recorded.  General appearance: alert, cooperative and no distress Heart: regular rate and rhythm, S1, S2 normal, paced. Bradycardia under the pacer. Lungs: clear to auscultation bilaterally Abdomen: soft, non-tender; bowel sounds normal; no masses,  no organomegaly Extremities: extremities normal, atraumatic, no cyanosis or edema Wound: clean and dry  Lab Results: Recent Labs    03/24/18 0448 03/25/18 0249  WBC 12.5* 12.7*  HGB 8.5* 8.3*  HCT 26.8* 25.3*  PLT 82* 104*   BMET:  Recent Labs    03/24/18 0448 03/25/18 0249  NA 138 139  K 3.6 4.2  CL 107 107  CO2 25 25  GLUCOSE 91 92  BUN 16 20  CREATININE 0.63 0.58  CALCIUM 7.9* 8.1*    PT/INR: No results for input(s): LABPROT, INR in the last 72 hours. ABG    Component Value Date/Time   PHART 7.416 03/22/2018 1225   HCO3 22.3 03/22/2018 1225   TCO2 23 03/22/2018 1225   ACIDBASEDEF 2.0 03/22/2018 1225   O2SAT 99.0 03/22/2018 1225   CBG (last 3)  Recent Labs    03/23/18 1550 03/23/18 2208 03/24/18 0631  GLUCAP 105* 104* 107*    Assessment/Plan: S/P Procedure(s) (LRB):  TRANSESOPHAGEAL ECHOCARDIOGRAM (TEE) (N/A) Excision Of Atrial Myxoma (N/A) Clipping Of Atrial Appendage - using an AtriCure 41mm clip  1. CV- NSR in the 70s-paced. BP well controlled. Continue ASA. Lopressor discontinued due to bradycardia.  2. Pulm-bilateral pleural effusions > on the left. Chest tubes in place with 320cc/24 hours.   3. Renal-creatinine 0.63, electrolytes okay. Continue IV lasix for expected fluid overload.  4. H and H 8.3/25.3, expected acute blood loss anemia.  5. Endo-A1C is 5.3. Can discontinue blood sugar checks.   Plan: Remains brady (30s) under the pacer.  Will order CXR. Keep chest tubes for now due to output. Continue diuresis for fluid overload. Weaning oxygen as able-mostly needs at night for desaturation.    LOS: 6 days    Elgie Collard 03/25/2018   I have seen and examined the patient and agree with the assessment and plan as outlined.  Sinus node dysfunction persists.  Currently maintaining stable junctional rhythm 50-55 beats/minute with no symptoms and pacer off.  Will put pacer on AAI backup at 50.  There are no indications for PPM placement at this time and I anticipate that we will be able to d/c pacing wires in 1-2 days if rhythm remains stable.  Leave chest tubes in at least one more day and d/c once output < 200 mL/day.  This will likely occur once volume  overload improves further.  She is diuresing and weight down 0.8 kg yesterday but still 5 kg > preop baseline.  Anticipate possible d/c home later this week if rhythm remains stable once chest tubes are out.  Rexene Alberts, MD 03/25/2018 8:16 AM

## 2018-03-25 NOTE — Telephone Encounter (Signed)
Error

## 2018-03-25 NOTE — Telephone Encounter (Signed)
Patient currently admitted. Will reassess for [facility] call tomorrow.

## 2018-03-25 NOTE — Telephone Encounter (Signed)
-----   Message from Candis Schatz sent at 03/24/2018  2:25 PM EDT ----- Regarding: TOC call Pt is post resection of left atrial mass, pt has an appt. Pt will need a TOC call.  Thanks [employer]

## 2018-03-25 NOTE — Progress Notes (Signed)
CARDIAC REHAB PHASE I   PRE:  Rate/Rhythm: 43 JR  BP:  Supine:   Sitting: 103/46  Standing:    SaO2: 95%RA  MODE:  Ambulation: 112 ft   POST:  Rate/Rhythm: 68 JR  BP:  Supine:   Sitting: 96/44  Standing:    SaO2: 97%RA 0835-0915 Pt walked 112 ft on RA with asst x 2 and rolling walker. Stopped to rest due to feeling SOB. Tried to obtain sats in hallway without success. Encouraged pt to walk as far as she could.  To recliner after walk. sats at 97%RA in chair. Chest tube and pacer intact. Call bell in reach. To bathroom prior to walk. Tolerated activity well. Denied dizziness with decreased BP.   Graylon Good, RN BSN  03/25/2018 9:12 AM

## 2018-03-26 ENCOUNTER — Inpatient Hospital Stay (HOSPITAL_COMMUNITY): Payer: Medicare Other

## 2018-03-26 MED FILL — Electrolyte-R (PH 7.4) Solution: INTRAVENOUS | Qty: 4000 | Status: AC

## 2018-03-26 MED FILL — Heparin Sodium (Porcine) Inj 1000 Unit/ML: INTRAMUSCULAR | Qty: 30 | Status: AC

## 2018-03-26 MED FILL — Heparin Sodium (Porcine) Inj 1000 Unit/ML: INTRAMUSCULAR | Qty: 10 | Status: AC

## 2018-03-26 MED FILL — Mannitol IV Soln 20%: INTRAVENOUS | Qty: 500 | Status: AC

## 2018-03-26 MED FILL — Sodium Bicarbonate IV Soln 8.4%: INTRAVENOUS | Qty: 50 | Status: AC

## 2018-03-26 MED FILL — Sodium Chloride IV Soln 0.9%: INTRAVENOUS | Qty: 3000 | Status: AC

## 2018-03-26 MED FILL — Lidocaine HCl(Cardiac) IV PF Soln Pref Syr 100 MG/5ML (2%): INTRAVENOUS | Qty: 5 | Status: AC

## 2018-03-26 MED FILL — Magnesium Sulfate Inj 50%: INTRAMUSCULAR | Qty: 10 | Status: AC

## 2018-03-26 MED FILL — Potassium Chloride Inj 2 mEq/ML: INTRAVENOUS | Qty: 40 | Status: AC

## 2018-03-26 NOTE — Plan of Care (Signed)
  Problem: Clinical Measurements: Goal: Will remain free from infection Outcome: Progressing Goal: Respiratory complications will improve Outcome: Progressing   Problem: Coping: Goal: Level of anxiety will decrease Outcome: Progressing   Problem: Skin Integrity: Goal: Risk for impaired skin integrity will decrease Outcome: Progressing

## 2018-03-26 NOTE — Progress Notes (Signed)
CARDIAC REHAB PHASE I   PRE:  Rate/Rhythm: 28 JR    BP: sitting 119/49    SaO2: 95 RA  MODE:  Ambulation: 320 ft   POST:  Rate/Rhythm: 70 JR    BP: sitting 110/54     SaO2: 96 RA after sitting 2 min  Pt c/o back pain, got pain meds. Able to get up fairly independently and walk with RW, gait belt. She is quite SOB with walking. Encouraged her to rest standing and then walk more, which she was able to do x2. Increased distance. To EOB, faitgued and SOB. SaO2 would not register in hall, 96 RA on EOB after 2 min. Return to bed, encouraged more walking.  [id number]   [place], ACSM 03/26/2018 11:21 AM

## 2018-03-26 NOTE — Telephone Encounter (Signed)
Patient currently admitted at this time. Will reassess for [facility] call tomorrow.

## 2018-03-26 NOTE — Progress Notes (Signed)
      [address]       [place] [address]             [phone]      5 Days Post-Op Procedure(s) (LRB): TRANSESOPHAGEAL ECHOCARDIOGRAM (TEE) (N/A) Excision Of Atrial Myxoma (N/A) Clipping Of Atrial Appendage - using an AtriCure 11mm clip Subjective: Feels okay. She is having some pain at the chest tube site especially after not moving as much.   Objective: Vital signs in last 24 hours: Temp:  [97.9 F (36.6 C)-98.2 F (36.8 C)] 98.1 F (36.7 C) (03/18 0513) Pulse Rate:  [53-115] 54 (03/18 0513) Cardiac Rhythm: Junctional rhythm (03/18 0700) Resp:  [14-26] 23 (03/18 0513) BP: (100-133)/(46-67) 100/67 (03/18 0513) SpO2:  [89 %-100 %] 98 % (03/18 0513) Weight:  [59.1 kg] 59.1 kg (03/18 0513)     Intake/Output from previous day: 03/17 0701 - 03/18 0700 In: 949.8 [P.O.:940; I.V.:9.8] Out: 2640 [Urine:2350; Chest Tube:290] Intake/Output this shift: No intake/output data recorded.  General appearance: alert, cooperative and no distress Heart: regular rate and rhythm, S1, S2 normal, no murmur, click, rub or gallop Lungs: clear to auscultation bilaterally and diminished in the lower lobes Abdomen: soft, non-tender; bowel sounds normal; no masses,  no organomegaly Extremities: extremities normal, atraumatic, no cyanosis or edema Wound: clean and dry  Lab Results: Recent Labs    03/24/18 0448 03/25/18 0249  WBC 12.5* 12.7*  HGB 8.5* 8.3*  HCT 26.8* 25.3*  PLT 82* 104*   BMET:  Recent Labs    03/24/18 0448 03/25/18 0249  NA 138 139  K 3.6 4.2  CL 107 107  CO2 25 25  GLUCOSE 91 92  BUN 16 20  CREATININE 0.63 0.58  CALCIUM 7.9* 8.1*    PT/INR: No results for input(s): LABPROT, INR in the last 72 hours. ABG    Component Value Date/Time   PHART 7.416 03/22/2018 1225   HCO3 22.3 03/22/2018 1225   TCO2 23 03/22/2018 1225   ACIDBASEDEF 2.0 03/22/2018 1225   O2SAT 99.0 03/22/2018 1225   CBG (last 3)  Recent Labs    03/23/18 1550  03/23/18 2208 03/24/18 0631  GLUCAP 105* 104* 107*    Assessment/Plan: S/P Procedure(s) (LRB): TRANSESOPHAGEAL ECHOCARDIOGRAM (TEE) (N/A) Excision Of Atrial Myxoma (N/A) Clipping Of Atrial Appendage - using an AtriCure 53mm clip  1. CV- NSR in the 50s-back up rate of 50. BP well controlled. Continue ASA. Lopressor discontinued due to bradycardia.  2. Pulm-CXR stable. Chest tubes in place with 290cc/24 hours. 3. Renal-creatinine 0.58, electrolytes okay. Continue IV lasix for expected fluid overload. Good urine output.  4. H and H 8.3/25.3, expected acute blood loss anemia.  5. Endo-A1C is 5.3. Can discontinue blood sugar checks.   Plan:  Keep chest tubes for now due to output-will discontinue when output is < 200cc/24 hours. Continue diuresis for fluid overload. Weaning oxygen as able-mostly needs at night for desaturation. She is having difficultly using her incentive spirometer and gets short of breath easily when walking in the halls. Will order a flutter valve for additional pulmonary toilet.      LOS: 7 days    Elgie Collard 03/26/2018

## 2018-03-27 ENCOUNTER — Inpatient Hospital Stay (HOSPITAL_COMMUNITY): Payer: Medicare Other

## 2018-03-27 ENCOUNTER — Telehealth: Payer: Self-pay | Admitting: Physician Assistant

## 2018-03-27 LAB — BASIC METABOLIC PANEL
Anion gap: 7 (ref 5–15)
BUN: 18 mg/dL (ref 8–23)
CO2: 24 mmol/L (ref 22–32)
Calcium: 7.9 mg/dL — ABNORMAL LOW (ref 8.9–10.3)
Chloride: 105 mmol/L (ref 98–111)
Creatinine, Ser: 0.76 mg/dL (ref 0.44–1.00)
GFR calc Af Amer: >60 mL/min (ref >60)
GFR calc non Af Amer: >60 mL/min (ref >60)
Glucose, Bld: 102 mg/dL — ABNORMAL HIGH (ref 70–99)
Potassium: 3.9 mmol/L (ref 3.5–5.1)
SODIUM: 136 mmol/L (ref 135–145)

## 2018-03-27 LAB — MAGNESIUM: MAGNESIUM: 1.9 mg/dL (ref 1.7–2.4)

## 2018-03-27 MED ORDER — LEVALBUTEROL HCL 0.63 MG/3ML IN NEBU
0.6300 mg | INHALATION_SOLUTION | Freq: Three times a day (TID) | RESPIRATORY_TRACT | Status: DC
  Administered 2018-03-27: 0.63 mg via RESPIRATORY_TRACT
  Filled 2018-03-27: qty 3

## 2018-03-27 MED ORDER — ACETAMINOPHEN 325 MG PO TABS
650.0000 mg | ORAL_TABLET | Freq: Four times a day (QID) | ORAL | Status: DC | PRN
  Administered 2018-03-27 – 2018-03-28 (×4): 650 mg via ORAL
  Filled 2018-03-27 (×5): qty 2

## 2018-03-27 MED ORDER — LEVALBUTEROL HCL 0.63 MG/3ML IN NEBU: 0.6300 mg | INHALATION_SOLUTION | Freq: Four times a day (QID) | RESPIRATORY_TRACT | Status: DC | PRN

## 2018-03-27 NOTE — Progress Notes (Signed)
   [facility] will sign off.   Medication Recommendations:  Continue current Other recommendations (labs, testing, etc):  none Follow up as an outpatient:  Pt is scheduled for follow up in our [place] office on 04/11/2018.

## 2018-03-27 NOTE — Progress Notes (Signed)
CARDIAC REHAB PHASE I   PRE:  Rate/Rhythm: 68 JR    BP: sitting 130/89    SaO2: 95 RA  MODE:  Ambulation: 400 ft   POST:  Rate/Rhythm: 80 JR    BP: sitting 132/51     SaO2: 96 RA slow to register  Pt looking better today. Able to stand and walk independently with RW. Steady, slow. Increased DOE with distance and talking, rest x2 with slow pursed lip breathing. Increased distance, return to recliner. [phone]  [place], ACSM 03/27/2018 10:03 AM

## 2018-03-27 NOTE — Progress Notes (Addendum)
      [address]       [place] [address]             [phone]    Chest tube atrium level checked this afternoon. Tubes continued to put out >100cc/6 hours. Will plan to pull chest tubes first thing in the morning and discharge saturday morning if follow-up chest xray looks good. Patient's family at the bedside and the plan of care was reviewed with them. Xopenex nebs ordered as needed for shortness of breath (Patient was on albuterol at home PRN). Continued to encourage ambulation in the halls, incentive spirometer, and flutter valve. Will plan to start the patient's home dose of Breo Ellipta at discharge. Home walker ordered. May need home oxygen at night.   All questions answered at this time. Encouraged patient and family to reach out if additional questions/concerns arise.   Nicholes Rough, PA-C

## 2018-03-27 NOTE — Progress Notes (Addendum)
      [address]       [place] [address]             [phone]      6 Days Post-Op Procedure(s) (LRB): TRANSESOPHAGEAL ECHOCARDIOGRAM (TEE) (N/A) Excision Of Atrial Myxoma (N/A) Clipping Of Atrial Appendage - using an AtriCure 15mm clip Subjective: No issues overnight. She is still having some right shoulder pain which is probably referred from the chest tubes.   Objective: Vital signs in last 24 hours: Temp:  [97 F (36.1 C)-98.1 F (36.7 C)] 98 F (36.7 C) (03/19 0535) Pulse Rate:  [52-75] 55 (03/19 0535) Cardiac Rhythm: Junctional rhythm (03/19 0700) Resp:  [17-29] 23 (03/19 0535) BP: (120-136)/(45-62) 124/62 (03/19 0535) SpO2:  [94 %-99 %] 95 % (03/19 0535) Weight:  [57.8 kg] 57.8 kg (03/19 0535)     Intake/Output from previous day: 03/18 0701 - 03/19 0700 In: 670 [P.O.:670] Out: 895 [Urine:625; Chest Tube:270] Intake/Output this shift: No intake/output data recorded.  General appearance: alert, cooperative and no distress Heart: regular rate and rhythm, S1, S2 normal, no murmur, click, rub or gallop and bradycardia Lungs: clear to auscultation bilaterally Abdomen: soft, non-tender; bowel sounds normal; no masses,  no organomegaly Extremities: extremities normal, atraumatic, no cyanosis or edema Wound: clean and dry  Lab Results: Recent Labs    03/25/18 0249  WBC 12.7*  HGB 8.3*  HCT 25.3*  PLT 104*   BMET:  Recent Labs    03/25/18 0249 03/27/18 0328  NA 139 136  K 4.2 3.9  CL 107 105  CO2 25 24  GLUCOSE 92 102*  BUN 20 18  CREATININE 0.58 0.76  CALCIUM 8.1* 7.9*    PT/INR: No results for input(s): LABPROT, INR in the last 72 hours. ABG    Component Value Date/Time   PHART 7.416 03/22/2018 1225   HCO3 22.3 03/22/2018 1225   TCO2 23 03/22/2018 1225   ACIDBASEDEF 2.0 03/22/2018 1225   O2SAT 99.0 03/22/2018 1225   CBG (last 3)  No results for input(s): GLUCAP in the last 72 hours.  Assessment/Plan: S/P  Procedure(s) (LRB): TRANSESOPHAGEAL ECHOCARDIOGRAM (TEE) (N/A) Excision Of Atrial Myxoma (N/A) Clipping Of Atrial Appendage - using an AtriCure 2mm clip  1. CV- NSR in the50s (junctional)-back up rate of 50. BP well controlled. Continue ASA. Lopressor discontinued due to bradycardia.  2. Pulm-CXR stable. Chest tubes in place with 270cc/24 hours. 3. Renal-creatinine 0.76, electrolytes okay. Continue PO lasix for expected fluid overload. Good urine output. Weight continues to trend down. Remains 3kg over baseline weight.  4. H and H 8.3/25.3, expected acute blood loss anemia.  5. Endo-A1C is 5.3. Can discontinue blood sugar checks.  Plan: Remove EPW this morning-rhythm has been stable and she has not require any backup pacing. Marked the chest tubes and will re-evaluate this afternoon to see if they can be pulled. She is walking in the halls 3x daily-still with some shortness of breath.    LOS: 8 days    Elgie Collard 03/27/2018  On room air walking in hall Adequate junctional rhythm- EPWs out today Chest tubes out in next 24 hrs Surgical incisions clean,dry CXR today with mild vascular congestion, old fibrosis Dc planning - home with family this weekend  patient examined and medical record reviewed,agree with above note. Tharon Aquas Trigt III 03/27/2018

## 2018-03-27 NOTE — Progress Notes (Signed)
      [address]       [place] [address]             [phone]       Ventricular epicardial pacing wires pulled with tips in tact without issue. Patient experienced no pain and is now resting comfortably. Dressed with a 2 x 2 gauze and tape. No current bleeding at the skin. She will remain in bed for an hour per protocol. Call bell within reach.    Nicholes Rough, PA-C

## 2018-03-27 NOTE — Progress Notes (Signed)
Attempted to pull epicardial wires and met resistance with two attempts. PA Tessa paged and will address. Pt resting with call bell within reach.  Will continue to monitor. Frequent vitals being taken. [place], Sandra McClintock, RN   

## 2018-03-28 ENCOUNTER — Inpatient Hospital Stay (HOSPITAL_COMMUNITY): Payer: Medicare Other

## 2018-03-28 LAB — CBC
HCT: 28.6 % — ABNORMAL LOW (ref 36.0–46.0)
Hemoglobin: 9 g/dL — ABNORMAL LOW (ref 12.0–15.0)
MCH: 28.7 pg (ref 26.0–34.0)
MCHC: 31.5 g/dL (ref 30.0–36.0)
MCV: 91.1 fL (ref 80.0–100.0)
Platelets: 204 10*3/uL (ref 150–400)
RBC: 3.14 MIL/uL — ABNORMAL LOW (ref 3.87–5.11)
RDW: 16 % — ABNORMAL HIGH (ref 11.5–15.5)
WBC: 12 10*3/uL — ABNORMAL HIGH (ref 4.0–10.5)
nRBC: 1.3 % — ABNORMAL HIGH (ref 0.0–0.2)

## 2018-03-28 LAB — BASIC METABOLIC PANEL
Anion gap: 9 (ref 5–15)
BUN: 16 mg/dL (ref 8–23)
CO2: 24 mmol/L (ref 22–32)
Calcium: 8.4 mg/dL — ABNORMAL LOW (ref 8.9–10.3)
Chloride: 104 mmol/L (ref 98–111)
Creatinine, Ser: 0.78 mg/dL (ref 0.44–1.00)
GFR calc Af Amer: >60 mL/min (ref >60)
GFR calc non Af Amer: >60 mL/min (ref >60)
Glucose, Bld: 93 mg/dL (ref 70–99)
Potassium: 4.2 mmol/L (ref 3.5–5.1)
Sodium: 137 mmol/L (ref 135–145)

## 2018-03-28 MED ORDER — POLYETHYLENE GLYCOL 3350 17 G PO PACK
17.0000 g | PACK | Freq: Every day | ORAL | Status: DC
  Administered 2018-03-28 – 2018-03-29 (×2): 17 g via ORAL
  Filled 2018-03-28 (×2): qty 1

## 2018-03-28 NOTE — Progress Notes (Addendum)
      [address]       [place] [address]             [phone]      7 Days Post-Op Procedure(s) (LRB): TRANSESOPHAGEAL ECHOCARDIOGRAM (TEE) (N/A) Excision Of Atrial Myxoma (N/A) Clipping Of Atrial Appendage - using an AtriCure 39mm clip Subjective: She is uncomfortable this morning due to constipation.   Objective: Vital signs in last 24 hours: Temp:  [97.7 F (36.5 C)-98 F (36.7 C)] 98 F (36.7 C) (03/20 0415) Pulse Rate:  [52-126] 52 (03/20 0415) Cardiac Rhythm: Junctional rhythm (03/20 0415) Resp:  [19-27] 23 (03/20 0415) BP: (103-121)/(37-53) 115/51 (03/20 0415) SpO2:  [94 %-100 %] 98 % (03/20 0415) Weight:  [56.6 kg] 56.6 kg (03/20 0605)     Intake/Output from previous day: 03/19 0701 - 03/20 0700 In: 240 [P.O.:240] Out: 1740 [Urine:1500; Chest Tube:240] Intake/Output this shift: No intake/output data recorded.  General appearance: alert, cooperative and no distress Heart: regular rate and rhythm, S1, S2 normal, no murmur, click, rub or gallop Lungs: clear to auscultation bilaterally Abdomen: hyperactive bowel sounds Extremities: extremities normal, atraumatic, no cyanosis or edema Wound: clean and dry  Lab Results: Recent Labs    03/28/18 0318  WBC 12.0*  HGB 9.0*  HCT 28.6*  PLT 204   BMET:  Recent Labs    03/27/18 0328 03/28/18 0318  NA 136 137  K 3.9 4.2  CL 105 104  CO2 24 24  GLUCOSE 102* 93  BUN 18 16  CREATININE 0.76 0.78  CALCIUM 7.9* 8.4*    PT/INR: No results for input(s): LABPROT, INR in the last 72 hours. ABG    Component Value Date/Time   PHART 7.416 03/22/2018 1225   HCO3 22.3 03/22/2018 1225   TCO2 23 03/22/2018 1225   ACIDBASEDEF 2.0 03/22/2018 1225   O2SAT 99.0 03/22/2018 1225   CBG (last 3)  No results for input(s): GLUCAP in the last 72 hours.  Assessment/Plan: S/P Procedure(s) (LRB): TRANSESOPHAGEAL ECHOCARDIOGRAM (TEE) (N/A) Excision Of Atrial Myxoma (N/A) Clipping Of Atrial Appendage  - using an AtriCure 39mm clip  1. CV- NSR in the50s-60s (junctional). BP well controlled. Continue ASA. Lopressor discontinued due to bradycardia.  2. Pulm-CXR stable.Chest tubes in place with240cc/24 hours. 3. Renal-creatinine 0.78, electrolytes okay. Continue PO lasix for expected fluid overload.Good urine output.Weight continues to trend down. Remains 2kg over baseline weight.  4. H and H 9.0/28.6, expected acute blood loss anemia.  5. Endo-A1C is 5.3.   Plan: Weight continues to trend down. Will discontinue patient's chest tubes. She is asking for a laxative for constipation so I ordered miralax. She is encouraged to ambulate in the halls this morning. She is using her incentive spirometer and flutter valve. She states she could breathe better after the xopenex treatment yesterday. CXR in the am and possible discharge.     LOS: 9 days    Elgie Collard 03/28/2018   Surgical incisions clean and dry Maintaining satisfactory junctional rhythm Chest tubes out Sats on room air 97% Prob DC in am if she has BM

## 2018-03-28 NOTE — Progress Notes (Signed)
CARDIAC REHAB PHASE I   PRE:  Rate/Rhythm: 68 SR  BP:  Supine:   Sitting: 126/46  Standing:    SaO2: 96 % RA  MODE:  Ambulation: 470 ft   POST:  Rate/Rhythm: 71 SR  BP:  Supine:   Sitting: 134/54  Standing:    SaO2: 96% RA  Pt assisted to ambulate 470 ft with minimal assistance using a walker. Pt tolerated well stopping 3 times for a short rest lasting about 30 second- 1 minute and reported no signs of symptoms. Pt assisted back to bed with no issues. Pt educated on sternal precautions and wound care and exercise instructions post discharge. Pt verbalized understanding. Call bell left within reach and instructed to call for assistance.  [place], BSN 03/28/2018 9:51 AM

## 2018-03-28 NOTE — Progress Notes (Signed)
Patient is able to discharge back to her independent living facility once medically stable.   No further social work intervention is required.   Domenic Schwab, MSW, [place]

## 2018-03-29 ENCOUNTER — Inpatient Hospital Stay (HOSPITAL_COMMUNITY): Payer: Medicare Other

## 2018-03-29 MED ORDER — ACETAMINOPHEN 325 MG PO TABS: 650.0000 mg | ORAL_TABLET | Freq: Four times a day (QID) | ORAL | Status: DC | PRN

## 2018-03-29 MED ORDER — TRAMADOL HCL 50 MG PO TABS: 50.0000 mg | ORAL_TABLET | Freq: Four times a day (QID) | ORAL | 0 refills | Status: AC | PRN

## 2018-03-29 MED ORDER — ASPIRIN 325 MG PO TBEC: 325.0000 mg | DELAYED_RELEASE_TABLET | Freq: Every day | ORAL | Status: DC

## 2018-03-29 NOTE — Progress Notes (Signed)
Discharge AVS meds take and those due reviewed with pt. Follow up appointments and when to call MD reviewed. All questions and concerns addressed. No further questions at this time. D/c IV and TELE, CCMD notified. D/C home per orders. Brought down via wheelchair with family and RN.

## 2018-03-29 NOTE — Progress Notes (Signed)
[address]       [place] [address]             [phone]      8 Days Post-Op Procedure(s) (LRB): TRANSESOPHAGEAL ECHOCARDIOGRAM (TEE) (N/A) Excision Of Atrial Myxoma (N/A) Clipping Of Atrial Appendage - using an AtriCure 46mm clip Subjective: Feels ok, + BM, breathing is comfortable  Objective: Vital signs in last 24 hours: Temp:  [97.6 F (36.4 C)-98.4 F (36.9 C)] 97.8 F (36.6 C) (03/21 0428) Pulse Rate:  [54-89] 54 (03/21 0428) Cardiac Rhythm: Junctional rhythm (03/21 0701) Resp:  [18-34] 34 (03/21 0434) BP: (110-134)/(42-62) 123/46 (03/21 0428) SpO2:  [95 %-99 %] 96 % (03/21 0428) Weight:  [54.2 kg] 54.2 kg (03/21 0434)  Hemodynamic parameters for last 24 hours:    Intake/Output from previous day: 03/20 0701 - 03/21 0700 In: 1000 [P.O.:1000] Out: 1020 [Urine:1000; Chest Tube:20] Intake/Output this shift: No intake/output data recorded.  General appearance: alert, cooperative and no distress Heart: regular rate and rhythm Lungs: minow crackles in bases Abdomen: benign Extremities: no edema Wound: incis healing well, mod echymosis  Lab Results: Recent Labs    03/28/18 0318  WBC 12.0*  HGB 9.0*  HCT 28.6*  PLT 204   BMET:  Recent Labs    03/27/18 0328 03/28/18 0318  NA 136 137  K 3.9 4.2  CL 105 104  CO2 24 24  GLUCOSE 102* 93  BUN 18 16  CREATININE 0.76 0.78  CALCIUM 7.9* 8.4*    PT/INR: No results for input(s): LABPROT, INR in the last 72 hours. ABG    Component Value Date/Time   PHART 7.416 03/22/2018 1225   HCO3 22.3 03/22/2018 1225   TCO2 23 03/22/2018 1225   ACIDBASEDEF 2.0 03/22/2018 1225   O2SAT 99.0 03/22/2018 1225   CBG (last 3)  No results for input(s): GLUCAP in the last 72 hours.  Meds Scheduled Meds: . aspirin EC  325 mg Oral Daily  . bisacodyl  10 mg Oral Daily   Or  . bisacodyl  10 mg Rectal Daily  . Chlorhexidine Gluconate Cloth  6 each Topical Daily  . docusate sodium  200 mg Oral  Daily  . ferrous [id number] C-folic acid  1 capsule Oral TID PC  . furosemide  40 mg Oral BID  . mouth rinse  15 mL Mouth Rinse BID  . pantoprazole  40 mg Oral Daily  . polyethylene glycol  17 g Oral Daily  . potassium chloride  20 mEq Oral BID  . sodium chloride flush  3 mL Intravenous Q12H   Continuous Infusions: . sodium chloride Stopped (03/27/18 2000)   PRN Meds:.acetaminophen, fentaNYL (SUBLIMAZE) injection, levalbuterol, ondansetron (ZOFRAN) IV, sodium chloride flush, traMADol  Xrays Dg Chest 2 View  Result Date: 03/28/2018 CLINICAL DATA:  Open-heart surgery EXAM: CHEST - 2 VIEW COMPARISON:  Yesterday FINDINGS: Chest tubes remain. No visible pneumothorax. Low volume chest with interstitial coarsening. Stable heart size. There is median sternotomy and left atrial clipping. IMPRESSION: 1. Stable postoperative chest.  No visible pneumothorax. 2. Interstitial lung disease. Electronically Signed   By: Monte Fantasia M.D.   On: 03/28/2018 06:58    Assessment/Plan: S/P Procedure(s) (LRB): TRANSESOPHAGEAL ECHOCARDIOGRAM (TEE) (N/A) Excision Of Atrial Myxoma (N/A) Clipping Of Atrial Appendage - using an AtriCure 78mm clip   1 doing well, hemodyn stable, rhythm stable 2 O2 sats good on RA 3 CXR some  interstitial dz- stable, no sig effus 4 no new labs  5 home today, staying with family  LOS: 10 days    John Giovanni [facility] 03/29/2018 Pager [phone]

## 2018-03-29 NOTE — TOC Transition Note (Signed)
Transition of Care [facility]) - CM/SW Discharge Note   Patient Details  Name: Wanda Nelson MRN: [id number] Date of Birth: [birth year]  [facility]) CM/SW Contact:  Eileen [place], LCSW Phone Number: 03/29/2018, 12:07 PM   Clinical Narrative:   Pt will return to [facility] (ILF). Pt states her daughter in law will transport her back. No further needs at this time. Clinical Social Worker will sign off for now as social work intervention is no longer needed. Please consult [place] again if new need arises.      Final next level of care: Home/Self Care(Independent LIving Facility) Barriers to Discharge: No Barriers Identified   Patient Goals and CMS Choice Patient states their goals for this hospitalization and ongoing recovery are:: to return to ILF/home CMS Medicare.gov Compare Post Acute Care list provided to:: (N/A) Choice offered to / list presented to : NA  Discharge Placement              Patient chooses bed at: (Pt will return to ILF) Patient to be transferred to facility by: Daughter in law Name of family member notified: Pt and daughter in law at bedside Patient and family notified of of transfer: 03/29/18  Discharge Plan and Services                          Social Determinants of Health ([place]) Interventions     Readmission Risk Interventions No flowsheet data found.

## 2018-03-29 NOTE — Progress Notes (Signed)
CARDIAC REHAB PHASE I   Offered to walk with pt, pt declining at this time stating she is tired this am and waiting on discharge. Reinforced d/c education with pt. Pt denies any questions or concerns. Will send CRP II referral to [facility].   [id number] Rufina Falco, RN BSN 03/29/2018 10:28 AM

## 2018-03-31 ENCOUNTER — Encounter: Payer: Self-pay | Admitting: Internal Medicine

## 2018-03-31 ENCOUNTER — Encounter: Payer: Self-pay | Admitting: Emergency Medicine

## 2018-03-31 ENCOUNTER — Emergency Department: Payer: Medicare Other

## 2018-03-31 ENCOUNTER — Other Ambulatory Visit: Payer: Self-pay

## 2018-03-31 ENCOUNTER — Emergency Department
Admission: EM | Admit: 2018-03-31 | Discharge: 2018-03-31 | Disposition: A | Discharge status: Home / Self Care | Payer: Medicare Other | Attending: Emergency Medicine | Admitting: Emergency Medicine

## 2018-03-31 DIAGNOSIS — R34 Anuria and oliguria: Secondary | ICD-10-CM | POA: Diagnosis not present

## 2018-03-31 DIAGNOSIS — Z8673 Personal history of transient ischemic attack (TIA), and cerebral infarction without residual deficits: Secondary | ICD-10-CM | POA: Insufficient documentation

## 2018-03-31 DIAGNOSIS — Z7982 Long term (current) use of aspirin: Secondary | ICD-10-CM | POA: Diagnosis not present

## 2018-03-31 DIAGNOSIS — I959 Hypotension, unspecified: Secondary | ICD-10-CM | POA: Diagnosis not present

## 2018-03-31 DIAGNOSIS — Z79899 Other long term (current) drug therapy: Secondary | ICD-10-CM | POA: Insufficient documentation

## 2018-03-31 DIAGNOSIS — R109 Unspecified abdominal pain: Secondary | ICD-10-CM | POA: Diagnosis not present

## 2018-03-31 DIAGNOSIS — E860 Dehydration: Secondary | ICD-10-CM | POA: Diagnosis not present

## 2018-03-31 DIAGNOSIS — J9811 Atelectasis: Secondary | ICD-10-CM | POA: Diagnosis not present

## 2018-03-31 DIAGNOSIS — R1012 Left upper quadrant pain: Secondary | ICD-10-CM | POA: Diagnosis not present

## 2018-03-31 DIAGNOSIS — R5381 Other malaise: Secondary | ICD-10-CM | POA: Diagnosis not present

## 2018-03-31 DIAGNOSIS — R52 Pain, unspecified: Secondary | ICD-10-CM | POA: Diagnosis not present

## 2018-03-31 LAB — URINALYSIS, COMPLETE (UACMP) WITH MICROSCOPIC
BILIRUBIN URINE: NEGATIVE
Bacteria, UA: NONE SEEN
Glucose, UA: NEGATIVE mg/dL
Hgb urine dipstick: NEGATIVE
Ketones, ur: 5 mg/dL — AB
Leukocytes,Ua: NEGATIVE
Nitrite: NEGATIVE
Protein, ur: NEGATIVE mg/dL
Specific Gravity, Urine: 1.023 (ref 1.005–1.030)
Squamous Epithelial / HPF: NONE SEEN (ref 0–5)
pH: 5 (ref 5.0–8.0)

## 2018-03-31 LAB — CBC WITH DIFFERENTIAL/PLATELET
Abs Immature Granulocytes: 0.2 10*3/uL — ABNORMAL HIGH (ref 0.00–0.07)
Basophils Absolute: 0.1 10*3/uL (ref 0.0–0.1)
Basophils Relative: 1 %
Eosinophils Absolute: 1.2 10*3/uL — ABNORMAL HIGH (ref 0.0–0.5)
Eosinophils Relative: 9 %
HCT: 32.4 % — ABNORMAL LOW (ref 36.0–46.0)
Hemoglobin: 10.2 g/dL — ABNORMAL LOW (ref 12.0–15.0)
Immature Granulocytes: 1 %
Lymphocytes Relative: 16 %
Lymphs Abs: 2.3 10*3/uL (ref 0.7–4.0)
MCH: 29.6 pg (ref 26.0–34.0)
MCHC: 31.5 g/dL (ref 30.0–36.0)
MCV: 93.9 fL (ref 80.0–100.0)
MONOS PCT: 9 %
Monocytes Absolute: 1.3 10*3/uL — ABNORMAL HIGH (ref 0.1–1.0)
Neutro Abs: 9 10*3/uL — ABNORMAL HIGH (ref 1.7–7.7)
Neutrophils Relative %: 64 %
Platelets: 282 10*3/uL (ref 150–400)
RBC: 3.45 MIL/uL — ABNORMAL LOW (ref 3.87–5.11)
RDW: 17.2 % — ABNORMAL HIGH (ref 11.5–15.5)
WBC: 14.1 10*3/uL — ABNORMAL HIGH (ref 4.0–10.5)
nRBC: 0.2 % (ref 0.0–0.2)

## 2018-03-31 LAB — COMPREHENSIVE METABOLIC PANEL
ALBUMIN: 3 g/dL — AB (ref 3.5–5.0)
ALT: 36 U/L (ref 0–44)
ANION GAP: 10 (ref 5–15)
AST: 40 U/L (ref 15–41)
Alkaline Phosphatase: 115 U/L (ref 38–126)
BUN: 24 mg/dL — ABNORMAL HIGH (ref 8–23)
CO2: 23 mmol/L (ref 22–32)
Calcium: 9 mg/dL (ref 8.9–10.3)
Chloride: 105 mmol/L (ref 98–111)
Creatinine, Ser: 0.92 mg/dL (ref 0.44–1.00)
GFR calc Af Amer: >60 mL/min (ref >60)
GFR calc non Af Amer: 57 mL/min — ABNORMAL LOW (ref >60)
Glucose, Bld: 101 mg/dL — ABNORMAL HIGH (ref 70–99)
Potassium: 4.3 mmol/L (ref 3.5–5.1)
Sodium: 138 mmol/L (ref 135–145)
Total Bilirubin: 1.2 mg/dL (ref 0.3–1.2)
Total Protein: 6.3 g/dL — ABNORMAL LOW (ref 6.5–8.1)

## 2018-03-31 LAB — LIPASE, BLOOD: Lipase: 33 U/L (ref 11–51)

## 2018-03-31 NOTE — Discharge Instructions (Addendum)
Drink plenty of fluids daily.  You may take your Tramadol as needed for pain.  You will be notified of any positive urine culture requiring antibiotic.  Return to the ER for worsening symptoms, persistent vomiting, fever, difficulty breathing or other concerns.

## 2018-03-31 NOTE — ED Triage Notes (Addendum)
Patient presents to Emergency Department via [facility] from independent living at [facility] with complaints of sharp sudden left flank pain the is now LLQ dull and aching  Pt reports oliguria last evening with 1 x  pink tinged urine at approx 7pm last night     History of Benign tumor removal from the heart on the 13 [facility]

## 2018-03-31 NOTE — ED Notes (Signed)
Peripheral IV discontinued. Catheter intact. No signs of infiltration or redness. Gauze applied to IV site.   Discharge instructions reviewed with patient. Questions fielded by this RN. Patient verbalizes understanding of instructions. Patient discharged home in stable condition per sung. No acute distress noted at time of discharge.    

## 2018-03-31 NOTE — ED Provider Notes (Signed)
[facility] Emergency Department Provider Note   ____________________________________________   First MD Initiated Contact with Patient 03/31/18 [phone]     (approximate)  I have reviewed the triage vital signs and the nursing notes.   HISTORY  Chief Complaint Flank Pain    HPI Wanda Nelson is a 85 y.o. female brought to the ED from home via EMS with a chief complaint of left flank pain.  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  Currently living at [facility] assisted living facility.  Reports sudden onset of left flank pain approximately 7 PM.  Pain resolved and patient was able to rest but was awakened prior to arrival by pain.  Also reports oliguria last evening.  Her daughter-in-law who is a former cardiac surgery nurse reports she was diuresed all week while admitted in the hospital.  Patient was just discharged to assisted living facility 1.5 days ago.  Patient denies fever, chills, chest pain, shortness of breath, abdominal pain, nausea, vomiting or diarrhea.  Denies recent travel or trauma.     Past Medical History:  Diagnosis Date  . Anemia   . Glaucoma   . History of blood transfusion   . History of hiatal hernia   . Hyperlipidemia   . Incidental pulmonary nodule, > 58mm and < 97mm 03/20/2018   Noted on CT scan - needs f/u imaging 6 months  . 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 Stroke [facility])     Patient Active Problem List   Diagnosis Date Noted  . Incidental pulmonary nodule, > 9mm and < 51mm 03/20/2018  . s/p resection left atrial myxoma 03/19/2018  . HLD (hyperlipidemia) 03/15/2015  . History of stroke 03/15/2015  . Glaucoma 03/15/2015    Past Surgical History:  Procedure Laterality Date  . ABDOMINAL HYSTERECTOMY  1982   total  . BUNIONECTOMY Bilateral   . CLIPPING OF ATRIAL APPENDAGE  03/21/2018   Procedure: Clipping Of Atrial Appendage - using an AtriCure 47mm clip;  Surgeon: Rexene Alberts,  MD;  Location: [facility] OR;  Service: Open Heart Surgery;;  . EXCISION OF ATRIAL MYXOMA N/A 03/21/2018   Procedure: Excision Of Atrial Myxoma;  Surgeon: Rexene Alberts, MD;  Location: [place];  Service: Open Heart Surgery;  Laterality: N/A;  . RECTOCELE REPAIR    . RIGHT/LEFT HEART CATH AND CORONARY ANGIOGRAPHY N/A 03/19/2018   Procedure: RIGHT/LEFT HEART CATH AND CORONARY ANGIOGRAPHY;  Surgeon: Wellington Hampshire, MD;  Location: [place] CV LAB;  Service: Cardiovascular;  Laterality: N/A;  . TEE WITHOUT CARDIOVERSION N/A 03/21/2018   Procedure: TRANSESOPHAGEAL ECHOCARDIOGRAM (TEE);  Surgeon: Rexene Alberts, MD;  Location: [place];  Service: Open Heart Surgery;  Laterality: N/A;  . TONSILLECTOMY      Prior to Admission medications   Medication Sig Start Date End Date Taking? Authorizing Provider  acetaminophen (TYLENOL) 325 MG tablet Take 2 tablets (650 mg total) by mouth every 6 (six) hours as needed for moderate pain. 03/29/18   Gold, Wilder Glade, PA-C  albuterol (PROVENTIL HFA;VENTOLIN HFA) 108 (90 Base) MCG/ACT inhaler Inhale 1-2 puffs into the lungs every 6 (six) hours as needed for wheezing or shortness of breath. 02/14/18   McLean-Scocuzza, Nino Glow, MD  aspirin EC 325 MG EC tablet Take 1 tablet (325 mg total) by mouth daily. 03/29/18   Gold, Wayne E, PA-C  atorvastatin (LIPITOR) 20 MG tablet Take 1 tablet (20 mg total) by mouth daily. 12/11/17   Webb Silversmith  W, NP  Calcium Carbonate-Vitamin D (CALCIUM 500 + D) 500-125 MG-UNIT TABS Take 1 tablet by mouth daily.    [provider]  cholecalciferol (VITAMIN D) 1000 units tablet Take 1,000 Units by mouth daily.    [provider]  Docusate Sodium (STOOL SOFTENER) 100 MG capsule Take 100 mg by mouth daily.    [provider]  ferrous sulfate (FEROSUL) 325 (65 FE) MG tablet Take 2 tablets (650 mg total) by mouth daily with breakfast. MUST SCHEDULE ANNUAL PHYSICAL 12/11/17   Jearld Fenton, NP  Flaxseed, Linseed, (FLAX SEED OIL) 1300  MG CAPS Take 1,100 mg by mouth daily.     [provider]  fluticasone furoate-vilanterol (BREO ELLIPTA) 200-25 MCG/INH AEPB Inhale 1 puff into the lungs daily. Rinse mouth after use. 02/25/18   Laverle Hobby, MD  Multiple Vitamin (MULTIVITAMIN) tablet Take 1 tablet by mouth daily.    [provider]  Omega-3 Fatty Acids (FISH OIL) 1200 MG CAPS Take 2,400 mg by mouth 2 (two) times daily.    [provider]  omeprazole (PRILOSEC) 20 MG capsule Take 1 capsule (20 mg total) by mouth daily. 02/25/18   Laverle Hobby, MD  psyllium (REGULOID) 0.52 g capsule Take 0.52 g by mouth daily.    [provider]  Spacer/Aero-Holding Chambers DEVI 1 Device by Does not apply route as needed. 02/18/18   Jearld Fenton, NP  traMADol (ULTRAM) 50 MG tablet Take 1-2 tablets (50-100 mg total) by mouth every 6 (six) hours as needed for up to 7 days for moderate pain. 03/29/18 04/05/18  John Giovanni, PA-C    Allergies Bee venom; Codeine; Morphine and related; Shellfish allergy; and Nitrofurantoin  Family History  Problem Relation Age of Onset  . Colon cancer Brother   . Colon cancer Maternal Uncle   . Colon cancer Maternal Grandmother     Social History Social History   Tobacco Use  . Smoking status: Never Smoker  . Smokeless tobacco: Never Used  Substance Use Topics  . Alcohol use: No    Alcohol/week: 0.0 standard drinks  . Drug use: No    Review of Systems  Constitutional: No fever/chills Eyes: No visual changes. ENT: No sore throat. Cardiovascular: Denies chest pain. Respiratory: Denies shortness of breath. Gastrointestinal: Positive for left flank pain.  No abdominal pain.  No nausea, no vomiting.  No diarrhea.  No constipation. Genitourinary: Positive for oliguria.  Negative for dysuria. Musculoskeletal: Negative for back pain. Skin: Negative for rash. Neurological: Negative for headaches, focal weakness or numbness.    ____________________________________________   PHYSICAL EXAM:  VITAL SIGNS: ED Triage Vitals  Enc Vitals Group     BP 03/31/18 0428 130/82     Pulse Rate 03/31/18 0428 (!) 55     Resp 03/31/18 0428 18     Temp 03/31/18 0428 (!) 97.5 F (36.4 C)     Temp Source 03/31/18 0428 Oral     SpO2 03/31/18 0423 99 %     Weight 03/31/18 0430 122 lb (55.3 kg)     Height 03/31/18 0430 5\' 2"  (1.575 m)     Head Circumference --      Peak Flow --      Pain Score 03/31/18 0428 8     Pain Loc --      Pain Edu? --      Excl. in [place]? --     Constitutional: Alert and oriented.  Frail appearing and in no acute distress. Eyes:  Conjunctivae are normal. PERRL. EOMI. Head: Atraumatic. Nose: No congestion/rhinnorhea. Mouth/Throat: Mucous membranes are moist.  Oropharynx non-erythematous. Neck: No stridor.   Cardiovascular: Normal rate, regular rhythm. Grossly normal heart sounds.  Good peripheral circulation.  Incisions clean/dry/intact.  Ecchymotic right anterior chest which are postoperative changes. Respiratory: Normal respiratory effort.  No retractions. Lungs CTAB. Gastrointestinal: Soft and nontender. No distention. No abdominal bruits. No CVA tenderness. Musculoskeletal: No lower extremity tenderness nor edema.  No joint effusions. Neurologic:  Normal speech and language. No gross focal neurologic deficits are appreciated. No gait instability. Skin:  Skin is warm, dry and intact. No rash noted.  No vesicles. Psychiatric: Mood and affect are normal. Speech and behavior are normal.  ____________________________________________   LABS (all labs ordered are listed, but only abnormal results are displayed)  Labs Reviewed  URINALYSIS, COMPLETE (UACMP) WITH MICROSCOPIC - Abnormal; Notable for the following components:      Result Value   Color, Urine YELLOW (*)    APPearance CLEAR (*)    Ketones, ur 5 (*)    All other components within normal limits  COMPREHENSIVE METABOLIC PANEL - Abnormal;  Notable for the following components:   Glucose, Bld 101 (*)    BUN 24 (*)    Total Protein 6.3 (*)    Albumin 3.0 (*)    GFR calc non Af Amer 57 (*)    All other components within normal limits  CBC WITH DIFFERENTIAL/PLATELET - Abnormal; Notable for the following components:   WBC 14.1 (*)    RBC 3.45 (*)    Hemoglobin 10.2 (*)    HCT 32.4 (*)    RDW 17.2 (*)    Neutro Abs 9.0 (*)    Monocytes Absolute 1.3 (*)    Eosinophils Absolute 1.2 (*)    Abs Immature Granulocytes 0.20 (*)    All other components within normal limits  URINE CULTURE  LIPASE, BLOOD   ____________________________________________  EKG  None ____________________________________________  RADIOLOGY  ED MD interpretation: Stable chest x-ray, unremarkable CT  Official radiology report(s): Dg Chest 2 View  Result Date: 03/31/2018 CLINICAL DATA:  Left-sided flank pain since yesterday EXAM: CHEST - 2 VIEW COMPARISON:  Two days ago FINDINGS: Chronic cardiomegaly. Median sternotomy with left atrial clipping. Generalized interstitial coarsening that is unchanged. Lung volumes are low. Soft tissue emphysema that is decreased. Right apical pneumothorax is no longer seen. IMPRESSION: 1. Chronic lung disease with atelectasis and trace pleural fluid by contemporaneous abdominal CT. 2. Right pneumothorax is no longer seen. Electronically Signed   By: Monte Fantasia M.D.   On: 03/31/2018 06:11   Ct Renal Stone Study  Result Date: 03/31/2018 CLINICAL DATA:  Left flank pain and pink urine. EXAM: CT ABDOMEN AND PELVIS WITHOUT CONTRAST TECHNIQUE: Multidetector CT imaging of the abdomen and pelvis was performed following the standard protocol without IV contrast. COMPARISON:  None similar FINDINGS: Lower chest: Chronic subpleural reticulation accentuated by atelectasis. Trace pleural effusions. Large hiatal hernia. Recent median sternotomy. Gas in the right chest wall where there was a chest tube recently. No visible  pneumothorax. Hepatobiliary: Small cystic density in the upper right liver.No evidence of biliary obstruction or stone. Pancreas: Unremarkable. Spleen: Unremarkable. Adrenals/Urinary Tract: Negative adrenals. No hydronephrosis or stone. Unremarkable bladder. Stomach/Bowel: No obstruction. No appendicitis. Left colonic diverticulosis. Vascular/Lymphatic: No acute vascular abnormality. Atherosclerotic calcification. No mass or adenopathy. Reproductive:Hysterectomy. Other: No ascites or pneumoperitoneum. Musculoskeletal: Generalized disc and facet degeneration. IMPRESSION: 1. No hydronephrosis or urinary calculus. No acute finding throughout the  abdomen. 2. Sequela of recent thoracic surgery. Electronically Signed   By: Monte Fantasia M.D.   On: 03/31/2018 05:49    ____________________________________________   PROCEDURES  Procedure(s) performed (including Critical Care):  Procedures   ____________________________________________   INITIAL IMPRESSION / ASSESSMENT AND PLAN / ED COURSE  As part of my medical decision making, I reviewed the following data within the [place] History obtained from family, Nursing notes reviewed and incorporated, Labs reviewed, Radiograph reviewed and Notes from prior ED visits        85 year old female who presents with left flank pain. Differential diagnosis includes, but is not limited to, acute appendicitis, renal colic, testicular torsion, urinary tract infection/pyelonephritis, prostatitis,  epididymitis, diverticulitis, small bowel obstruction or ileus, colitis, abdominal aortic aneurysm, gastroenteritis, hernia, etc.  Will obtain basic lab work, urinalysis.  Will obtain chest x-ray and CT renal colic study.  Patient currently denies pain or nausea.   Clinical Course as of Mar 30 645  Mon Mar 31, 2018  0625 Updated patient and her daughter-in-law of all test results.  We discussed etiology of pain being possibly dehydration,  musculoskeletal, recently passed stone, pain preceding shingles.  Patient still has tramadol left over from her surgery which she can take.  If worse she will talk to her doctor in the morning.  Strict return precautions given.  Both verbalized understanding and agree with plan of care.   [JS]    Clinical Course User Index [JS] Paulette Blanch, MD     ____________________________________________   FINAL CLINICAL IMPRESSION(S) / ED DIAGNOSES  Final diagnoses:  Left flank pain  Dehydration     ED Discharge Orders    None       Note:  This document was prepared using Dragon voice recognition software and may include unintentional dictation errors.   Paulette Blanch, MD 03/31/18 [phone]

## 2018-04-01 LAB — URINE CULTURE: Culture: NO GROWTH

## 2018-04-01 NOTE — Telephone Encounter (Signed)
Given recent CT surgery, I think she will be best served with w/ an in-person visit w/ either me or Dr. Fletcher Anon.  A Webex visit might be possible if she has access to it.

## 2018-04-01 NOTE — Telephone Encounter (Signed)
To Ignacia Bayley, NP to review. Patient is TOC on your schedule for 04/11/18.  Please review to see if she will need a physical office visit or e-visit candidate.

## 2018-04-02 ENCOUNTER — Other Ambulatory Visit: Payer: Self-pay | Admitting: *Deleted

## 2018-04-02 DIAGNOSIS — J849 Interstitial pulmonary disease, unspecified: Secondary | ICD-10-CM

## 2018-04-03 NOTE — Telephone Encounter (Signed)
Spoke with patient's daughter, ok per DPR to schedule appointment. We discussed at length patient coming for in office visit vs. VIDEO visit. She is hesitant to have her mother come out in office and expose her to potential COVID19 if not necessary. States her mother is doing well, she is walking and working up her endurance. No cardiac symptoms to note at this time.  They would prefer a VIDEO or telephone visit if Dr Fletcher Anon is ok with this. However, are willing to come in if necessary. They have BP cuff and could take patient's vital signs at home. [place] was thinking the only thing that may be needed would be an EKG which they couldn't do.   For now, patient scheduled to come into office to see Dr Fletcher Anon on 04/08/18. Aware they may receive a call to change appointment if appropriate. Routing to Dr Fletcher Anon for advice on what type of appointment he would like.   THey have an iPhone and could do facetime.

## 2018-04-03 NOTE — Telephone Encounter (Signed)
Schedule a video visit

## 2018-04-04 NOTE — Telephone Encounter (Signed)
Spoke with the patient's daughter per the dpr. The patient has agreed to a web visit. Consent and instructions have been sent to the patient via [place].      TELEPHONE CALL NOTE  Wanda Nelson has been deemed a candidate for a follow-up tele-health visit to limit community exposure during the Covid-19 pandemic. I spoke with the patient via phone to ensure availability of phone/video source, confirm preferred email & phone number, and discuss instructions and expectations.  I reminded Wanda Nelson to be prepared with any vital sign and/or heart rhythm information that could potentially be obtained via home monitoring, at the time of her visit. I reminded Wanda Nelson to expect a phone call at the time of her visit if her visit.  Did the patient verbally acknowledge consent to treatment? Consent sent through [place], RN 04/04/2018 4:42 PM  CONSENT FOR TELE-HEALTH VISIT - PLEASE REVIEW  I hereby voluntarily request, consent and authorize [facility] and its employed or contracted physicians, physician assistants, nurse practitioners or other licensed health care professionals (the Practitioner), to provide me with telemedicine health care services (the "Services") as deemed necessary by the treating Practitioner. I acknowledge and consent to receive the Services by the Practitioner via telemedicine. I understand that the telemedicine visit will involve communicating with the Practitioner through live audiovisual communication technology and the disclosure of certain medical information by electronic transmission. I acknowledge that I have been given the opportunity to request an in-person assessment or other available alternative prior to the telemedicine visit and am voluntarily participating in the telemedicine visit.  I understand that I have the right to withhold or withdraw my consent to the use of telemedicine in the course of my care at any time,  without affecting my right to future care or treatment, and that the Practitioner or I may terminate the telemedicine visit at any time. I understand that I have the right to inspect all information obtained and/or recorded in the course of the telemedicine visit and may receive copies of available information for a reasonable fee.  I understand that some of the potential risks of receiving the Services via telemedicine include:  Marland Kitchen Delay or interruption in medical evaluation due to technological equipment failure or disruption; . Information transmitted may not be sufficient (e.g. poor resolution of images) to allow for appropriate medical decision making by the Practitioner; and/or  . In rare instances, security protocols could fail, causing a breach of personal health information.  Furthermore, I acknowledge that it is my responsibility to provide information about my medical history, conditions and care that is complete and accurate to the best of my ability. I acknowledge that Practitioner's advice, recommendations, and/or decision may be based on factors not within their control, such as incomplete or inaccurate data provided by me or distortions of diagnostic images or specimens that may result from electronic transmissions. I understand that the practice of medicine is not an exact science and that Practitioner makes no warranties or guarantees regarding treatment outcomes. I acknowledge that I will receive a copy of this consent concurrently upon execution via email to the email address I last provided but may also request a printed copy by calling the office of [place].    I understand that my insurance will be billed for this visit.   I have read or had this consent read to me. . I understand the contents of this consent, which adequately explains the benefits and risks  of the Services being provided via telemedicine.  . I have been provided ample opportunity to ask questions regarding  this consent and the Services and have had my questions answered to my satisfaction. . I give my informed consent for the services to be provided through the use of telemedicine in my medical care  By participating in this telemedicine visit I agree to the above.

## 2018-04-07 NOTE — Telephone Encounter (Signed)
Consent has been provided via [place]. Please call Ilda Basset prior to the web visit for the pre check in process.

## 2018-04-08 ENCOUNTER — Other Ambulatory Visit: Payer: Self-pay

## 2018-04-08 ENCOUNTER — Telehealth (INDEPENDENT_AMBULATORY_CARE_PROVIDER_SITE_OTHER): Payer: Medicare Other | Admitting: Cardiovascular Disease

## 2018-04-08 ENCOUNTER — Encounter: Payer: Self-pay | Admitting: Cardiovascular Disease

## 2018-04-08 ENCOUNTER — Other Ambulatory Visit: Payer: Self-pay | Admitting: *Deleted

## 2018-04-08 VITALS — BP 147/65 | HR 56 | Ht 62.0 in | Wt 119.0 lb

## 2018-04-08 DIAGNOSIS — D151 Benign neoplasm of heart: Secondary | ICD-10-CM

## 2018-04-08 NOTE — Patient Instructions (Signed)
Medication Instructions:  No change If you need a refill on your cardiac medications before your next appointment, please call your pharmacy.   Lab work: None If you have labs (blood work) drawn today and your tests are completely normal, you will receive your results only by: Marland Kitchen MyChart Message (if you have MyChart) OR . A paper copy in the mail If you have any lab test that is abnormal or we need to change your treatment, we will call you to review the results.  Testing/Procedures: Echocardiogram in 3 months  Follow-Up: At [facility], you and your health needs are our priority.  As part of our continuing mission to provide you with exceptional heart care, we have created designated Provider Care Teams.  These Care Teams include your primary Cardiologist (physician) and Advanced Practice Providers (APPs -  Physician Assistants and Nurse Practitioners) who all work together to provide you with the care you need, when you need it. You will need a follow up appointment in 3 months.  Please call our office 2 months in advance to schedule this appointment.  You may see [name], MD or one of the following Advanced Practice Providers on your designated Care Team:   Murray Hodgkins, NP Christell Faith, PA-C . Marrianne Mood, PA-C  Any Other Special Instructions Will Be Listed Below (If Applicable).

## 2018-04-08 NOTE — Progress Notes (Signed)
Virtual Visit via Video Note    Evaluation Performed:  Follow-up visit  This visit type was conducted due to national recommendations for restrictions regarding the COVID-19 Pandemic (e.g. social distancing).  This format is felt to be most appropriate for this patient at this time.  All issues noted in this document were discussed and addressed.  No physical exam was performed (except for noted visual exam findings with Video Visits).  Please refer to the patient's chart (MyChart message for video visits and phone note for telephone visits) for the patient's consent to telehealth for [facility].  Date:  04/08/2018   ID:  Wanda Nelson, DOB November 14, 1931, MRN [id number]  Patient Location:    Provider location:   [facility]  PCP:  Jearld Fenton, NP  Cardiologist:  [name], MD  Electrophysiologist:  None   Chief Complaint: Follow-up visit regarding recent atrial myxoma resection.  History of Present Illness:    Wanda Nelson is a 85 y.o. female who presents via audio/video conferencing for a telehealth visit today.   She has known history of anemia, hiatal hernia, hyperlipidemia and remote history of hemorrhagic stroke.  The patient started having shortness of breath last year and was diagnosed with possible pulmonary fibrosis based on CT imaging.  She had an echocardiogram done which showed a large left atrial mass originating from the intra-atrial septum suggestive of atrial myxoma.  She underwent cardiac catheterization that showed no evidence of obstructive coronary artery disease.  On March 13, she underwent resection of left atrial mass and clipping of left atrial appendage by Dr. Roxy Manns.  She had postoperative tongue swelling and GI symptoms.  She also had issues with bradycardia that gradually improved. She went to the emergency room last week with left flank pain.  Labs showed slight dehydration with a BUN of 24.  CBC showed mild leukocytosis and anemia.   Lipase was normal and urinalysis was unremarkable.  She is doing well overall with minimal discomfort and stable shortness of breath.  Heart rate continues to be somewhat on the low side in the 50s but she has readings also between 60 to 70 bpm.  Blood pressure today was 147/70 with a heart rate of 56 bpm.  No dizziness, syncope or presyncope. She is trying to increase her activities gradually.  The patient does not have symptoms concerning for COVID-19 infection (fever, chills, cough, or new shortness of breath).    Prior CV studies:   The following studies were reviewed today:    Past Medical History:  Diagnosis Date  . Anemia   . Glaucoma   . History of blood transfusion   . History of hiatal hernia   . Hyperlipidemia   . Incidental pulmonary nodule, > 72mm and < 10mm 03/20/2018   Noted on CT scan - needs f/u imaging 6 months  . 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 Stroke [facility])    Past Surgical History:  Procedure Laterality Date  . ABDOMINAL HYSTERECTOMY  1982   total  . BUNIONECTOMY Bilateral   . CLIPPING OF ATRIAL APPENDAGE  03/21/2018   Procedure: Clipping Of Atrial Appendage - using an AtriCure 59mm clip;  Surgeon: Rexene Alberts, MD;  Location: [facility] OR;  Service: Open Heart Surgery;;  . EXCISION OF ATRIAL MYXOMA N/A 03/21/2018   Procedure: Excision Of Atrial Myxoma;  Surgeon: Rexene Alberts, MD;  Location: [place];  Service: Open Heart Surgery;  Laterality: N/A;  . RECTOCELE REPAIR    .  RIGHT/LEFT HEART CATH AND CORONARY ANGIOGRAPHY N/A 03/19/2018   Procedure: RIGHT/LEFT HEART CATH AND CORONARY ANGIOGRAPHY;  Surgeon: Wellington Hampshire, MD;  Location: [place] CV LAB;  Service: Cardiovascular;  Laterality: N/A;  . TEE WITHOUT CARDIOVERSION N/A 03/21/2018   Procedure: TRANSESOPHAGEAL ECHOCARDIOGRAM (TEE);  Surgeon: Rexene Alberts, MD;  Location: [place];  Service: Open Heart Surgery;  Laterality: N/A;  . TONSILLECTOMY       Current Meds  Medication Sig  .  acetaminophen (TYLENOL) 325 MG tablet Take 2 tablets (650 mg total) by mouth every 6 (six) hours as needed for moderate pain.  Marland Kitchen albuterol (PROVENTIL HFA;VENTOLIN HFA) 108 (90 Base) MCG/ACT inhaler Inhale 1-2 puffs into the lungs every 6 (six) hours as needed for wheezing or shortness of breath.  Marland Kitchen aspirin EC 325 MG EC tablet Take 1 tablet (325 mg total) by mouth daily.  Marland Kitchen atorvastatin (LIPITOR) 20 MG tablet Take 1 tablet (20 mg total) by mouth daily.  . Calcium Carbonate-Vitamin D (CALCIUM 500 + D) 500-125 MG-UNIT TABS Take 1 tablet by mouth daily.  . cholecalciferol (VITAMIN D) 1000 units tablet Take 1,000 Units by mouth daily.  Mariane Baumgarten Sodium (STOOL SOFTENER) 100 MG capsule Take 100 mg by mouth daily.  . ferrous sulfate (FEROSUL) 325 (65 FE) MG tablet Take 2 tablets (650 mg total) by mouth daily with breakfast. MUST SCHEDULE ANNUAL PHYSICAL  . Flaxseed, Linseed, (FLAX SEED OIL) 1300 MG CAPS Take 1,100 mg by mouth daily.   . fluticasone furoate-vilanterol (BREO ELLIPTA) 200-25 MCG/INH AEPB Inhale 1 puff into the lungs daily. Rinse mouth after use.  . Multiple Vitamin (MULTIVITAMIN) tablet Take 1 tablet by mouth daily.  . Omega-3 Fatty Acids (FISH OIL) 1200 MG CAPS Take 2,400 mg by mouth 2 (two) times daily.  Marland Kitchen omeprazole (PRILOSEC) 20 MG capsule Take 1 capsule (20 mg total) by mouth daily.  . psyllium (REGULOID) 0.52 g capsule Take 0.52 g by mouth daily.  Marland Kitchen Spacer/Aero-Holding Chambers DEVI 1 Device by Does not apply route as needed.     Allergies:   Bee venom; Codeine; Morphine and related; Shellfish allergy; and Nitrofurantoin   Social History   Tobacco Use  . Smoking status: Never Smoker  . Smokeless tobacco: Never Used  Substance Use Topics  . Alcohol use: No    Alcohol/week: 0.0 standard drinks  . Drug use: No     Family Hx: The patient's family history includes Colon cancer in her brother, maternal grandmother, and maternal uncle.  ROS:   Please see the history of present  illness.     All other systems reviewed and are negative.   Labs/Other Tests and Data Reviewed:    Recent Labs: 05/11/2017: B Natriuretic Peptide 75.0 02/14/2018: NT-Pro BNP 110 03/27/2018: Magnesium 1.9 03/31/2018: ALT 36; BUN 24; Creatinine, Ser 0.92; Hemoglobin 10.2; Platelets 282; Potassium 4.3; Sodium 138   Recent Lipid Panel Lab Results  Component Value Date/Time   CHOL 154 12/11/2017 02:36 PM   TRIG 79.0 12/11/2017 02:36 PM   HDL 52.80 12/11/2017 02:36 PM   CHOLHDL 3 12/11/2017 02:36 PM   LDLCALC 86 12/11/2017 02:36 PM    Wt Readings from Last 3 Encounters:  04/08/18 119 lb (54 kg)  03/31/18 122 lb (55.3 kg)  03/29/18 119 lb 7.8 oz (54.2 kg)     Objective:    Vital Signs:  BP (!) 147/65   Pulse (!) 56   Ht 5\' 2"  (1.575 m)   Wt 119 lb (54 kg)  BMI 21.77 kg/m    Well nourished, well developed female in no acute distress.  Surgical wounds appear to be healing nicely.  The patient does not appear to be pale.  ASSESSMENT & PLAN:    1.  Status post surgical resection of atrial myxoma: She appears to be progressing well as expected. I am planning to repeat an echocardiogram in 3 months to ensure no recurrent myxoma. I suspect that a lot of her pulmonary symptoms will improve after resection of this atrial myxoma.  2.  Bradycardia post surgery: The patient had junctional rhythm.  She continues to be somewhat bradycardic and below her baseline heart rate, I asked her to monitor blood pressure and heart rate once daily over the next 2 weeks and update [place].  She does not appear to be symptomatic although this might affect her exercise tolerance and we will have to monitor this closely to see if she requires a pacemaker.  COVID-19 Education: The signs and symptoms of COVID-19 were discussed with the patient and how to seek care for testing (follow up with PCP or arrange E-visit).  The importance of social distancing was discussed today.  Patient Risk:   After full review of  this patient's clinical status, I feel that they are at least moderate risk at this time.  Time:   Today, I have spent 23 minutes with the patient with telehealth technology discussing .     Medication Adjustments/Labs and Tests Ordered: Current medicines are reviewed at length with the patient today.  Concerns regarding medicines are outlined above.  Tests Ordered: No orders of the defined types were placed in this encounter.  Medication Changes: No orders of the defined types were placed in this encounter.   Disposition:  Follow up in 3 month(s)  Signed, [name], MD  04/08/2018 2:48 PM    [place]

## 2018-04-10 ENCOUNTER — Telehealth: Payer: Self-pay | Admitting: Cardiovascular Disease

## 2018-04-10 NOTE — Telephone Encounter (Addendum)
DPR on file. Spoke with the pt daughter in law [name] who is a retired Biomedical scientist. [name] sts that she was contacted by the pt yesterday with a complaint of increased sob. On assessment [name] noted that the pt HR was 90bpm and irregular. The pt was in no acute distress but was tachypneic. BP was running around her usual 119/70. On ascultation she did note diminished lung sounds at the right base, however that is where her chest tube was placed.   Today her HR is more regular but still tachy 112bpm. [name] thinks she may be in [place]. [name] sts that she does not feel the pt is in distress and she does not think the risk of possible exposure is needed. Adv her that I will fwd the update to Dr. Fletcher Anon and call back with his recommendation.  [name] is agreeable with the plan

## 2018-04-10 NOTE — Telephone Encounter (Signed)
Spoke with [name] and made her aware of Dr, Arida's recommendation. She should continue to monitor the pt for another day if this continues, HR>100bpm or symptoms worsen we can consider adding short-term Amiodarone. [name] verbalized understanding and is agreeable with the plan. [name] sts that she was out to the pt home today around 11:30am. She will do the same tomorrow and call the office to provide an update.

## 2018-04-10 NOTE — Telephone Encounter (Signed)
° °  Patient c/o Palpitations:  High priority if patient c/o lightheadedness, shortness of breath, or chest pain  1) How long have you had palpitations/irregular HR/ Afib? Are you having the symptoms now?  Yesterday yes  2) Are you currently experiencing lightheadedness, SOB or CP? Sob tired   3) Do you have a history of afib (atrial fibrillation) or irregular heart rhythm? Yes   4) Have you checked your BP or HR? (document readings if available):     Yesterday 120/70 BP  90 Irreg  HR 28-32 rr  Today 119/70 tachy 112 HR regular now    decreased R Base lung sounds sob bur not in distress   5) Are you experiencing any other symptoms? Patient daughter in law calling to let us know what is going on and to see if Dr. Fletcher Anon has any suggestions

## 2018-04-11 ENCOUNTER — Ambulatory Visit: Payer: Medicare Other | Admitting: Nurse Practitioner

## 2018-04-11 NOTE — Telephone Encounter (Signed)
Daughter in law, [name] is calling to give an update regarding pt HR. Please call.

## 2018-04-11 NOTE — Telephone Encounter (Signed)
Spoke with [name] pt daughter in law. [name] was out to the pt home this morning to re-access her. [name] sts that the pt is doing much better daughter. Pt BP 126/69 HR felt regular 70bpm. [name] had the pt ambulate her HR range 76-80 bpm. Pt sob is back to her baseline RR 24.  Tomasa Hose that I will fwd the update to Dr. Fletcher Anon. She continue to monitor the pt using the previous parameters given by Dr. Fletcher Anon. Tomasa Hose that I will fwd an update to Dr. Fletcher Anon, we will call back if he has additional recommendations.  [name] is agreeable with the plan.

## 2018-04-15 ENCOUNTER — Encounter: Payer: Self-pay | Admitting: Thoracic Surgery (Cardiothoracic Vascular Surgery)

## 2018-04-15 ENCOUNTER — Telehealth: Payer: Self-pay | Admitting: Thoracic Surgery (Cardiothoracic Vascular Surgery)

## 2018-04-15 NOTE — Telephone Encounter (Signed)
[address]       [place] [address]             [phone]     CARDIOTHORACIC SURGERY TELEPHONE VIRTUAL OFFICE NOTE  Primary Cardiologist is [name], MD PCP is Jearld Fenton, NP   HPI:  I spoke with Wanda Nelson via telephone on 04/15/2018 at 9:45 AM and verified that I was speaking with the correct person.  Patient is an 85 year old female with history of anemia, hiatal hernia, hyperlipidemia, and remote history of stroke who presented with symptoms of worsening shortness of breath and was found to have a large mass in the left atrium consistent with benign left atrial myxoma.  She underwent surgical resection on March 21, 2018.  Her surgery was initially approach via right minithoracotomy but required conversion to full conventional sternotomy intraoperatively.  Her postoperative recovery in the hospital was uneventful and she was eventually discharged home on the seventh postoperative day.  Several days later she was seen in the emergency department because of transient episode of left flank pain.  Chest x-ray and abdominal CT scan were unremarkable at that time.  Since then she has been home where her daughter checks on her frequently.  Because of the ongoing COVID-19 restrictions I telephoned the patient personally to check on how she is doing.  She reports that she continues to gradually improve and she is pleased with her progress.  She still gets short of breath with exertion but this is stable and improving.  She has not had any significant pain and she has not taking any sort of pain relievers other than Tylenol.  She states that she is sleeping reasonably well when she gets comfortable.  She does note that she cannot lay flat in bed.  However, her weight has been stable since hospital discharge and she has not developed lower extremity edema.  Appetite is slowly improving.  Bowels are working normally.  She is ambulating without much difficulty  and continues to increased on a daily basis.  She has not had any fevers, chills, nor productive cough.  She states that her daughter checks her pulse and blood pressure daily and it has been stable.   Current Outpatient Medications  Medication Sig Dispense Refill  . acetaminophen (TYLENOL) 325 MG tablet Take 2 tablets (650 mg total) by mouth every 6 (six) hours as needed for moderate pain.    Marland Kitchen albuterol (PROVENTIL HFA;VENTOLIN HFA) 108 (90 Base) MCG/ACT inhaler Inhale 1-2 puffs into the lungs every 6 (six) hours as needed for wheezing or shortness of breath. 1 Inhaler 0  . aspirin EC 325 MG EC tablet Take 1 tablet (325 mg total) by mouth daily.    Marland Kitchen atorvastatin (LIPITOR) 20 MG tablet Take 1 tablet (20 mg total) by mouth daily. 90 tablet 3  . Calcium Carbonate-Vitamin D (CALCIUM 500 + D) 500-125 MG-UNIT TABS Take 1 tablet by mouth daily.    . cholecalciferol (VITAMIN D) 1000 units tablet Take 1,000 Units by mouth daily.    Mariane Baumgarten Sodium (STOOL SOFTENER) 100 MG capsule Take 100 mg by mouth daily.    . ferrous sulfate (FEROSUL) 325 (65 FE) MG tablet Take 2 tablets (650 mg total) by mouth daily with breakfast. MUST SCHEDULE ANNUAL PHYSICAL 180 tablet 3  . Flaxseed, Linseed, (FLAX SEED OIL) 1300 MG CAPS Take 1,100 mg by mouth daily.     . fluticasone furoate-vilanterol (BREO ELLIPTA) 200-25 MCG/INH AEPB Inhale  1 puff into the lungs daily. Rinse mouth after use. 1 each 5  . Multiple Vitamin (MULTIVITAMIN) tablet Take 1 tablet by mouth daily.    . Omega-3 Fatty Acids (FISH OIL) 1200 MG CAPS Take 2,400 mg by mouth 2 (two) times daily.    Marland Kitchen omeprazole (PRILOSEC) 20 MG capsule Take 1 capsule (20 mg total) by mouth daily. 30 capsule 11  . psyllium (REGULOID) 0.52 g capsule Take 0.52 g by mouth daily.    Marland Kitchen Spacer/Aero-Holding Chambers DEVI 1 Device by Does not apply route as needed. 1 each 0   No current facility-administered medications for this visit.      Diagnostic Tests:  CHEST - 2 VIEW   COMPARISON:  Two days ago  FINDINGS: Chronic cardiomegaly. Median sternotomy with left atrial clipping. Generalized interstitial coarsening that is unchanged. Lung volumes are low. Soft tissue emphysema that is decreased. Right apical pneumothorax is no longer seen.  IMPRESSION: 1. Chronic lung disease with atelectasis and trace pleural fluid by contemporaneous abdominal CT. 2. Right pneumothorax is no longer seen.   Electronically Signed   By: Monte Fantasia M.D.   On: 03/31/2018 06:11    CT ABDOMEN AND PELVIS WITHOUT CONTRAST  TECHNIQUE: Multidetector CT imaging of the abdomen and pelvis was performed following the standard protocol without IV contrast.  COMPARISON:  None similar  FINDINGS: Lower chest: Chronic subpleural reticulation accentuated by atelectasis. Trace pleural effusions.  Large hiatal hernia.  Recent median sternotomy. Gas in the right chest wall where there was a chest tube recently. No visible pneumothorax.  Hepatobiliary: Small cystic density in the upper right liver.No evidence of biliary obstruction or stone.  Pancreas: Unremarkable.  Spleen: Unremarkable.  Adrenals/Urinary Tract: Negative adrenals. No hydronephrosis or stone. Unremarkable bladder.  Stomach/Bowel: No obstruction. No appendicitis. Left colonic diverticulosis.  Vascular/Lymphatic: No acute vascular abnormality. Atherosclerotic calcification. No mass or adenopathy.  Reproductive:Hysterectomy.  Other: No ascites or pneumoperitoneum.  Musculoskeletal: Generalized disc and facet degeneration.  IMPRESSION: 1. No hydronephrosis or urinary calculus. No acute finding throughout the abdomen. 2. Sequela of recent thoracic surgery.   Electronically Signed   By: Monte Fantasia M.D.   On: 03/31/2018 05:49    Impression:  Patient seems to be doing quite well approximately 3 weeks following resection of left atrial myxoma   Plan:  I have not  recommended any changes to the patient's current medications.  I have encouraged the patient to continue to gradually increase her physical activity as tolerated.  I have reminded her to continue to check her weight on a daily basis and to look for signs of fluid overload such as lower extremity edema.  We will postpone her routine follow-up appointment in our office until the COVID-19 restrictions have been lifted.  We will continue to check on the patient intermittently via telephone, and I have instructed the patient to call us if she has any new problems or complaints.  All of her questions have been answered.   I discussed limitations of evaluation and management via telephone.  The patient was advised to call back or seek an in-person evaluation if the patient's clinical condition changes in any significant manner.    Valentina Gu. Roxy Manns, MD 04/15/2018 9:45 AM

## 2018-04-19 ENCOUNTER — Telehealth: Payer: Self-pay | Admitting: Cardiovascular Disease

## 2018-04-19 ENCOUNTER — Other Ambulatory Visit: Payer: Self-pay | Admitting: Cardiovascular Disease

## 2018-04-19 DIAGNOSIS — R000 Tachycardia, unspecified: Secondary | ICD-10-CM

## 2018-04-19 MED ORDER — PROPRANOLOL HCL 10 MG PO TABS: 10.0000 mg | ORAL_TABLET | Freq: Three times a day (TID) | ORAL | 1 refills | Status: DC | PRN

## 2018-04-19 NOTE — Telephone Encounter (Signed)
Received phone call concerning arrhythmia on Saturday, April 19, 2018 Daughter-in-law reports symptoms concerning for paroxysmal tachycardia Unable to exclude atrial fibrillation or flutter She also reports prior concern for accelerated junctional rhythm Arrhythmia started 1 week ago or more Recent history of bradycardia, currently not on beta-blockers  After long discussion we have sent in propranolol 10 mg 3 times daily as needed for her to take for rate greater than 100 bpm sustained  We we also send in a ZIO monitor to rule out atrial fibrillation, flutter  She can follow-up with Evisit after monitor results are available  Signed, Esmond Plants, MD, Ph.D [facility]

## 2018-04-21 ENCOUNTER — Ambulatory Visit: Payer: Medicare Other | Admitting: Thoracic Surgery (Cardiothoracic Vascular Surgery)

## 2018-04-21 NOTE — Addendum Note (Signed)
Addended by: Vanessa Ralphs on: 04/21/2018 08:42 AM   Modules accepted: Orders

## 2018-04-21 NOTE — Telephone Encounter (Signed)
Called patient's daughter, ok per DPR.  Discussed the ZIO monitor process and she verbalized understanding. States patient has not had any other episodes since the weekend.  Registered patient on [facility] website to receive monitor in the mail. Scheduled patient a Virtual visit about 3-4 weeks out.

## 2018-04-23 ENCOUNTER — Ambulatory Visit (INDEPENDENT_AMBULATORY_CARE_PROVIDER_SITE_OTHER): Payer: Medicare Other

## 2018-04-23 DIAGNOSIS — R000 Tachycardia, unspecified: Secondary | ICD-10-CM | POA: Diagnosis not present

## 2018-04-28 NOTE — Telephone Encounter (Signed)
      [address]       [place] [address]             [phone]       No telephone call was made.  Elgie Collard, PA-C

## 2018-05-05 ENCOUNTER — Ambulatory Visit: Payer: Medicare Other | Admitting: Podiatry

## 2018-05-09 ENCOUNTER — Telehealth: Payer: Self-pay

## 2018-05-09 NOTE — Telephone Encounter (Signed)
Daughter called to cancel 5/13 office visit. Wanted to let Dr. Ashby Dawes know the patient had open heart surgery, is doing very well, is not using inhalers, and is following up with cardiology. At this time her shortness of breath has improved.   Dr. Ashby Dawes, this is just an FYI.

## 2018-05-12 DIAGNOSIS — R000 Tachycardia, unspecified: Secondary | ICD-10-CM | POA: Diagnosis not present

## 2018-05-13 ENCOUNTER — Telehealth: Payer: Self-pay | Admitting: Cardiovascular Disease

## 2018-05-13 ENCOUNTER — Other Ambulatory Visit: Payer: Self-pay

## 2018-05-13 DIAGNOSIS — I480 Paroxysmal atrial fibrillation: Secondary | ICD-10-CM

## 2018-05-13 DIAGNOSIS — I4729 Other ventricular tachycardia: Secondary | ICD-10-CM

## 2018-05-13 DIAGNOSIS — I471 Supraventricular tachycardia, unspecified: Secondary | ICD-10-CM

## 2018-05-13 HISTORY — DX: Supraventricular tachycardia, unspecified: I47.10

## 2018-05-13 HISTORY — DX: Other ventricular tachycardia: I47.29

## 2018-05-13 HISTORY — DX: Paroxysmal atrial fibrillation: I48.0

## 2018-05-13 NOTE — Telephone Encounter (Signed)
Received MD alert on monitor report that has been uploaded to site. Notified provider that MD alert received and monitor uploaded and assigned for him to review.

## 2018-05-13 NOTE — Telephone Encounter (Signed)
°  1. Is this related to a heart monitor you are wearing?  (If the patient says no, please ask     if they are caling about ICD/pacemaker.) yes  2. What is your issue??  irhythm on hold for triage tx pam    Please route to covering RN/CMA/RMA for results. Route to monitor technicians or your monitor tech representative for your site for any technical concerns

## 2018-05-14 ENCOUNTER — Telehealth: Payer: Self-pay | Admitting: *Deleted

## 2018-05-14 NOTE — Telephone Encounter (Signed)
Patient made aware of results and verbalized understanding.  The patient stated that she is asymptomatic and has been walking for exercise regularly. She has an appointment on 5/8 with Dr. Fletcher Anon. Her daughter will be present to discuss any next steps.

## 2018-05-14 NOTE — Telephone Encounter (Signed)
-----   Message from Wellington Hampshire, MD sent at 05/13/2018  4:05 PM EDT ----- Inform patient that monitor showed intermittent episodes of A. fib with RVR as well as SVT.  This is the cause of her palpitations and tachycardia.  If her symptoms are improved, we do not need to add any medication.  However, if she continues to have palpitations and tachycardia, I recommend adding short-term amiodarone.  Probably best to discuss with her daughter [name].

## 2018-05-15 ENCOUNTER — Telehealth: Payer: Self-pay

## 2018-05-15 NOTE — Telephone Encounter (Signed)

## 2018-05-16 ENCOUNTER — Telehealth (INDEPENDENT_AMBULATORY_CARE_PROVIDER_SITE_OTHER): Payer: Medicare Other | Admitting: Cardiovascular Disease

## 2018-05-16 ENCOUNTER — Other Ambulatory Visit: Payer: Self-pay

## 2018-05-16 ENCOUNTER — Encounter: Payer: Self-pay | Admitting: Cardiovascular Disease

## 2018-05-16 VITALS — BP 137/85 | HR 81 | Ht 62.0 in | Wt 117.0 lb

## 2018-05-16 DIAGNOSIS — D151 Benign neoplasm of heart: Secondary | ICD-10-CM | POA: Diagnosis not present

## 2018-05-16 NOTE — Progress Notes (Signed)
Virtual Visit via Video Note    Evaluation Performed:  Follow-up visit  This visit type was conducted due to national recommendations for restrictions regarding the COVID-19 Pandemic (e.g. social distancing).  This format is felt to be most appropriate for this patient at this time.  All issues noted in this document were discussed and addressed.  No physical exam was performed (except for noted visual exam findings with Video Visits).  Please refer to the patient's chart (MyChart message for video visits and phone note for telephone visits) for the patient's consent to telehealth for [facility].  Date:  05/16/2018   ID:  Wanda Nelson, DOB 08/30/1931, MRN [id number]  Patient Location:    Provider location:   [facility]  PCP:  Jearld Fenton, NP  Cardiologist:  [name], MD  Electrophysiologist:  None   Chief Complaint: Follow-up visit regarding  atrial myxoma resection and palpitations..  History of Present Illness:    Wanda Nelson is a 85 y.o. female who presents via audio/video conferencing for a telehealth visit today.   She has known history of anemia, hiatal hernia, hyperlipidemia and remote history of hemorrhagic stroke.  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  Cardiac catheterization before surgery showed no evidence of obstructive coronary artery disease.   She had postoperative tongue swelling and GI symptoms.  She also had issues with bradycardia that gradually improved.  She called our office with recurrent tachycardia and palpitations.  I performed a 2-week outpatient monitor which showed normal sinus rhythm with an average heart rate of 63 bpm.  She had 5 short runs of ventricular tachycardia, intermittent atrial fibrillation with RVR with the longest episode lasting 1 hour and 25 minutes.  A. fib burden was less than 1%.  She now reports that her palpitations  have resolved completely and she feels significantly better.  She is able to walk one quarter of a mile with no chest pain or shortness of breath.  She has not had to use her inhalers.  The patient does not have symptoms concerning for COVID-19 infection (fever, chills, cough, or new shortness of breath).    Prior CV studies:   The following studies were reviewed today:    Past Medical History:  Diagnosis Date  . Anemia   . Glaucoma   . History of blood transfusion   . History of hiatal hernia   . Hyperlipidemia   . Incidental pulmonary nodule, > 71mm and < 78mm 03/20/2018   Noted on CT scan - needs f/u imaging 6 months  . 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 Stroke [facility])    Past Surgical History:  Procedure Laterality Date  . ABDOMINAL HYSTERECTOMY  1982   total  . BUNIONECTOMY Bilateral   . CLIPPING OF ATRIAL APPENDAGE  03/21/2018   Procedure: Clipping Of Atrial Appendage - using an AtriCure 68mm clip;  Surgeon: Rexene Alberts, MD;  Location: [facility] OR;  Service: Open Heart Surgery;;  . EXCISION OF ATRIAL MYXOMA N/A 03/21/2018   Procedure: Excision Of Atrial Myxoma;  Surgeon: Rexene Alberts, MD;  Location: [place];  Service: Open Heart Surgery;  Laterality: N/A;  . RECTOCELE REPAIR    . RIGHT/LEFT HEART CATH AND CORONARY ANGIOGRAPHY N/A 03/19/2018   Procedure: RIGHT/LEFT HEART CATH AND CORONARY ANGIOGRAPHY;  Surgeon: Wellington Hampshire, MD;  Location: [place] CV LAB;  Service: Cardiovascular;  Laterality: N/A;  . TEE WITHOUT CARDIOVERSION N/A 03/21/2018   Procedure: TRANSESOPHAGEAL ECHOCARDIOGRAM (TEE);  Surgeon: Rexene Alberts, MD;  Location: [place];  Service: Open Heart Surgery;  Laterality: N/A;  . TONSILLECTOMY       Current Meds  Medication Sig  . acetaminophen (TYLENOL) 325 MG tablet Take 2 tablets (650 mg total) by mouth every 6 (six) hours as needed for moderate pain.  Marland Kitchen albuterol (PROVENTIL HFA;VENTOLIN HFA) 108 (90 Base) MCG/ACT inhaler Inhale 1-2 puffs into  the lungs every 6 (six) hours as needed for wheezing or shortness of breath.  Marland Kitchen aspirin EC 325 MG EC tablet Take 1 tablet (325 mg total) by mouth daily.  Marland Kitchen atorvastatin (LIPITOR) 20 MG tablet Take 1 tablet (20 mg total) by mouth daily.  . Calcium Carbonate-Vitamin D (CALCIUM 500 + D) 500-125 MG-UNIT TABS Take 1 tablet by mouth daily.  . cholecalciferol (VITAMIN D) 1000 units tablet Take 1,000 Units by mouth daily.  Mariane Baumgarten Sodium (STOOL SOFTENER) 100 MG capsule Take 100 mg by mouth daily.  . ferrous sulfate (FEROSUL) 325 (65 FE) MG tablet Take 2 tablets (650 mg total) by mouth daily with breakfast. MUST SCHEDULE ANNUAL PHYSICAL  . Flaxseed, Linseed, (FLAX SEED OIL) 1300 MG CAPS Take 1,100 mg by mouth daily.   . Multiple Vitamin (MULTIVITAMIN) tablet Take 1 tablet by mouth daily.  . Omega-3 Fatty Acids (FISH OIL) 1200 MG CAPS Take 2,400 mg by mouth 2 (two) times daily.  Marland Kitchen omeprazole (PRILOSEC) 20 MG capsule Take 1 capsule (20 mg total) by mouth daily.  . propranolol (INDERAL) 10 MG tablet Take 1 tablet (10 mg total) by mouth 3 (three) times daily as needed.  . psyllium (REGULOID) 0.52 g capsule Take 0.52 g by mouth daily.  Marland Kitchen Spacer/Aero-Holding Chambers DEVI 1 Device by Does not apply route as needed.     Allergies:   Bee venom; Codeine; Morphine and related; Shellfish allergy; and Nitrofurantoin   Social History   Tobacco Use  . Smoking status: Never Smoker  . Smokeless tobacco: Never Used  Substance Use Topics  . Alcohol use: No    Alcohol/week: 0.0 standard drinks  . Drug use: No     Family Hx: The patient's family history includes Colon cancer in her brother, maternal grandmother, and maternal uncle.  ROS:   Please see the history of present illness.     All other systems reviewed and are negative.   Labs/Other Tests and Data Reviewed:    Recent Labs: 02/14/2018: NT-Pro BNP 110 03/27/2018: Magnesium 1.9 03/31/2018: ALT 36; BUN 24; Creatinine, Ser 0.92; Hemoglobin 10.2;  Platelets 282; Potassium 4.3; Sodium 138   Recent Lipid Panel Lab Results  Component Value Date/Time   CHOL 154 12/11/2017 02:36 PM   TRIG 79.0 12/11/2017 02:36 PM   HDL 52.80 12/11/2017 02:36 PM   CHOLHDL 3 12/11/2017 02:36 PM   LDLCALC 86 12/11/2017 02:36 PM    Wt Readings from Last 3 Encounters:  05/16/18 117 lb (53.1 kg)  04/08/18 119 lb (54 kg)  03/31/18 122 lb (55.3 kg)     Objective:    Vital Signs:  BP 137/85   Pulse 81   Ht 5\' 2"  (1.575 m)   Wt 117 lb (53.1 kg)   BMI 21.40 kg/m    Well nourished, well developed female in no acute distress.  Surgical wounds appear to be healing nicely.    ASSESSMENT & PLAN:    1.  Status post surgical resection of  atrial myxoma: She appears to be progressing well as expected. Repeat echocardiogram in July  2.  Postoperative atrial fibrillation: Monitor recently showed intermittent atrial fibrillation with RVR and frequent runs of SVT.  A. fib burden was less than 1%.  Given that the burden was low and she had left atrial appendage resection, I do not really think she needs anticoagulation at the present time.  Continue aspirin.   Symptomatically, she has improved significantly with no recurrent palpitations.  Thus, no need for treatment currently.  If she has recurrent symptoms, I plan on treating her with short-term amiodarone.  COVID-19 Education: The signs and symptoms of COVID-19 were discussed with the patient and how to seek care for testing (follow up with PCP or arrange E-visit).  The importance of social distancing was discussed today.  Patient Risk:   After full review of this patient's clinical status, I feel that they are at least moderate risk at this time.  Time:   Today, I have spent 22 minutes with the patient with telehealth technology discussing .     Medication Adjustments/Labs and Tests Ordered: Current medicines are reviewed at length with the patient today.  Concerns regarding medicines are outlined  above.  Tests Ordered: No orders of the defined types were placed in this encounter.  Medication Changes: No orders of the defined types were placed in this encounter.   Disposition:  Follow up in 3 month(s)  Signed, [name], MD  05/16/2018 1:04 PM    [place]

## 2018-05-16 NOTE — Patient Instructions (Addendum)
.  Medication Instructions:  None If you need a refill on your cardiac medications before your next appointment, please call your pharmacy.   Lab work: None If you have labs (blood work) drawn today and your tests are completely normal, you will receive your results only by: Marland Kitchen MyChart Message (if you have MyChart) OR . A paper copy in the mail If you have any lab test that is abnormal or we need to change your treatment, we will call you to review the results.  Testing/Procedures: Your physician has requested that you have an echocardiogram in July. Echocardiography is a painless test that uses sound waves to create images of your heart. It provides your doctor with information about the size and shape of your heart and how well your heart's chambers and valves are working. You may receive an ultrasound enhancing agent through an IV if needed to better visualize your heart during the echo.This procedure takes approximately one hour. There are no restrictions for this procedure. This will take place at the [facility] clinic.    Follow-Up: At [facility], you and your health needs are our priority.  As part of our continuing mission to provide you with exceptional heart care, we have created designated Provider Care Teams.  These Care Teams include your primary Cardiologist (physician) and Advanced Practice Providers (APPs -  Physician Assistants and Nurse Practitioners) who all work together to provide you with the care you need, when you need it. You will need a follow up appointment in 3 months.  Please call our office 2 months in advance to schedule this appointment.  You may see [name], MD or one of the following Advanced Practice Providers on your designated Care Team:   Murray Hodgkins, NP Christell Faith, PA-C . Marrianne Mood, PA-C

## 2018-05-19 ENCOUNTER — Other Ambulatory Visit: Payer: Self-pay | Admitting: Thoracic Surgery (Cardiothoracic Vascular Surgery)

## 2018-05-19 ENCOUNTER — Ambulatory Visit (INDEPENDENT_AMBULATORY_CARE_PROVIDER_SITE_OTHER): Payer: Self-pay | Admitting: Thoracic Surgery (Cardiothoracic Vascular Surgery)

## 2018-05-19 ENCOUNTER — Ambulatory Visit
Admission: RE | Admit: 2018-05-19 | Discharge: 2018-05-19 | Disposition: A | Discharge status: Home / Self Care | Payer: Medicare Other | Source: Ambulatory Visit | Attending: Thoracic Surgery (Cardiothoracic Vascular Surgery) | Admitting: Thoracic Surgery (Cardiothoracic Vascular Surgery)

## 2018-05-19 ENCOUNTER — Other Ambulatory Visit: Payer: Self-pay

## 2018-05-19 ENCOUNTER — Encounter: Payer: Self-pay | Admitting: Thoracic Surgery (Cardiothoracic Vascular Surgery)

## 2018-05-19 VITALS — BP 160/80 | HR 78 | Temp 97.5°F | Resp 16 | Ht 62.0 in | Wt 118.0 lb

## 2018-05-19 DIAGNOSIS — D151 Benign neoplasm of heart: Secondary | ICD-10-CM

## 2018-05-19 NOTE — Patient Instructions (Signed)
Continue all previous medications without any changes at this time  You may resume unrestricted physical activity without any particular limitations at this time.   

## 2018-05-19 NOTE — Progress Notes (Signed)
[address]       [place] [address]             [phone]     CARDIOTHORACIC SURGERY OFFICE NOTE  Referring Provider is Wellington Hampshire, MD Primary Cardiologist is [name], MD PCP is Jearld Fenton, NP   HPI:  Patient is an 85 year old female with history of anemia, hiatal hernia, hyperlipidemia, and remote history of stroke who presented with symptoms of worsening shortness of breath and was found to have a large mass in the left atrium consistent with benign left atrial myxoma.  She underwent surgical resection on March 21, 2018.  Her surgery was initially approach via right minithoracotomy but required conversion to full conventional sternotomy intraoperatively.  Her postoperative recovery in the hospital was uneventful and she was eventually discharged home on the seventh postoperative day.  Patient was seen in the emergency department on March 31, 2018 because of a transient episode of flank pain.  Chest x-ray at that time looked quite good and abdominal CT scan was unremarkable.  I spoke with the patient over the telephone on April 15, 2018 at which time she was doing well.  Over the next couple weeks she had several episodes of palpitations.  Eventually a ZIO patch was placed.  The results of been monitored by Dr. Fletcher Anon.  Apparently the patient has for the most part remained in sinus rhythm with a few very brief episodes of atrial fibrillation and SVT.  She remains on aspirin 325 mg daily.  Anticoagulation has not been started.  The patient is not currently on a beta-blocker.  She reports that over the past 2 weeks she has not had any more palpitations.  She is quite active physically and is walking every day.  She reports that her breathing is dramatically better than it was prior to surgery.  She no longer has problems with exertional shortness of breath or wheezing.  She has mild soreness in the right lateral chest wall related to her surgical incision.   Appetite is good.  Overall she feels quite well   Current Outpatient Medications  Medication Sig Dispense Refill   acetaminophen (TYLENOL) 325 MG tablet Take 2 tablets (650 mg total) by mouth every 6 (six) hours as needed for moderate pain.     aspirin EC 325 MG EC tablet Take 1 tablet (325 mg total) by mouth daily.     atorvastatin (LIPITOR) 20 MG tablet Take 1 tablet (20 mg total) by mouth daily. 90 tablet 3   Calcium Carbonate-Vitamin D (CALCIUM 500 + D) 500-125 MG-UNIT TABS Take 1 tablet by mouth daily.     cholecalciferol (VITAMIN D) 1000 units tablet Take 1,000 Units by mouth daily.     Docusate Sodium (STOOL SOFTENER) 100 MG capsule Take 100 mg by mouth daily.     ferrous sulfate (FEROSUL) 325 (65 FE) MG tablet Take 2 tablets (650 mg total) by mouth daily with breakfast. MUST SCHEDULE ANNUAL PHYSICAL 180 tablet 3   Flaxseed, Linseed, (FLAX SEED OIL) 1300 MG CAPS Take 1,100 mg by mouth daily.      Multiple Vitamin (MULTIVITAMIN) tablet Take 1 tablet by mouth daily.     Omega-3 Fatty Acids (FISH OIL) 1200 MG CAPS Take 2,400 mg by mouth 2 (two) times daily.     omeprazole (PRILOSEC) 20 MG capsule Take 1 capsule (20 mg total) by mouth daily. 30 capsule 11   propranolol (INDERAL) 10 MG tablet Take  1 tablet (10 mg total) by mouth 3 (three) times daily as needed. 90 tablet 1   psyllium (REGULOID) 0.52 g capsule Take 0.52 g by mouth daily.     No current facility-administered medications for this visit.       Physical Exam:   BP (!) 160/80 (BP Location: Right Arm, Patient Position: Sitting, Cuff Size: Normal)    Pulse 78    Temp (!) 97.5 F (36.4 C) (Skin)    Resp 16    Ht 5\' 2"  (1.575 m)    Wt 118 lb (53.5 kg)    SpO2 99% Comment: RA   BMI 21.58 kg/m   General:  Well-appearing  Chest:   Clear to auscultation with symmetrical breath sounds  CV:   Regular rate and rhythm  Incisions:  Healing nicely sternum is stable  Abdomen:  Soft nontender  Extremities:  Warm and  well-perfused  Diagnostic Tests:  CHEST - 2 VIEW  COMPARISON:  03/31/2018 CT 02/17/2018, chest x-ray 02/14/2018  FINDINGS: Cardiomediastinal silhouette unchanged in size and contour. Double density projecting over the lower mediastinum is unchanged.  Surgical changes of median sternotomy and left atrial clipping/amputation.  Similar pattern of reticulonodular opacities of the bilateral lungs.  No pneumothorax. No pleural effusion. No confluent airspace disease. Degenerative changes of the spine without displaced fracture. Surgical changes of the right chest.  IMPRESSION: Chronic lung changes without evidence of acute cardiopulmonary disease.  Surgical changes of median sternotomy, right chest, and left atrial clipping.  Hiatal hernia   Electronically Signed   By: Corrie Mckusick D.O.   On: 05/19/2018 10:12   Impression:  Patient is doing well approximately 2 months status post resection of left atrial myxoma.  She currently has a ZIO patch in place which is being monitored by Dr. Fletcher Anon  Plan:  We have not recommended any changes to the patient's current medications.  If the patient continues to have episodes of paroxysmal atrial fibrillation or SVT it might be reasonable to consider long-term anticoagulation.  Presuming this is not the case at some point it would be reasonable to cut her dose of aspirin back to 81 mg daily.  We will defer this decision as well as any decision regarding whether or not to start a beta-blocker to the discretion of Dr. Fletcher Anon.  The patient has been instructed that she may continue to gradually increase her physical activity without any particular limitations.  She will return for routine follow-up next February, approximately 1 year following her surgery.  She will call and return sooner should specific problems or questions arise.   Valentina Gu. Roxy Manns, MD 05/19/2018 10:31 AM

## 2018-05-21 ENCOUNTER — Ambulatory Visit: Payer: Medicare Other | Admitting: Internal Medicine

## 2018-06-09 ENCOUNTER — Telehealth: Payer: Self-pay | Admitting: *Deleted

## 2018-06-09 NOTE — Telephone Encounter (Signed)
Copied from [place] [id number]. Topic: Appointment Scheduling - Scheduling Inquiry for Clinic >> Jun 09, 2018 10:44 AM Reyne Dumas L wrote: Reason for CRM:   Pt would like to schedule a new pt appointment with Dr. Aundra Dubin.  States she lives closer to this office.  Tried calling 3x.

## 2018-07-24 ENCOUNTER — Telehealth: Payer: Self-pay

## 2018-07-24 NOTE — Telephone Encounter (Signed)

## 2018-07-25 ENCOUNTER — Ambulatory Visit (INDEPENDENT_AMBULATORY_CARE_PROVIDER_SITE_OTHER): Payer: Medicare Other

## 2018-07-25 ENCOUNTER — Other Ambulatory Visit: Payer: Self-pay

## 2018-07-25 DIAGNOSIS — D151 Benign neoplasm of heart: Secondary | ICD-10-CM | POA: Diagnosis not present

## 2018-07-30 ENCOUNTER — Telehealth: Payer: Self-pay

## 2018-07-30 NOTE — Telephone Encounter (Signed)
Called to give the pt echo results. [email or web address].

## 2018-07-30 NOTE — Telephone Encounter (Signed)
-----   Message from Wellington Hampshire, MD sent at 07/30/2018  7:43 AM EDT ----- Inform patient that echo was fine. Normal EF with no evidence of recurrent atrial myxoma

## 2018-07-31 ENCOUNTER — Ambulatory Visit (INDEPENDENT_AMBULATORY_CARE_PROVIDER_SITE_OTHER): Payer: Medicare Other | Admitting: Internal Medicine

## 2018-07-31 ENCOUNTER — Encounter: Payer: Self-pay | Admitting: Internal Medicine

## 2018-07-31 ENCOUNTER — Telehealth: Payer: Self-pay

## 2018-07-31 ENCOUNTER — Other Ambulatory Visit: Payer: Self-pay

## 2018-07-31 VITALS — BP 126/62 | HR 58

## 2018-07-31 DIAGNOSIS — E785 Hyperlipidemia, unspecified: Secondary | ICD-10-CM | POA: Diagnosis not present

## 2018-07-31 DIAGNOSIS — D151 Benign neoplasm of heart: Secondary | ICD-10-CM

## 2018-07-31 DIAGNOSIS — K449 Diaphragmatic hernia without obstruction or gangrene: Secondary | ICD-10-CM | POA: Diagnosis not present

## 2018-07-31 DIAGNOSIS — D509 Iron deficiency anemia, unspecified: Secondary | ICD-10-CM

## 2018-07-31 DIAGNOSIS — Z86018 Personal history of other benign neoplasm: Secondary | ICD-10-CM

## 2018-07-31 DIAGNOSIS — Z1329 Encounter for screening for other suspected endocrine disorder: Secondary | ICD-10-CM

## 2018-07-31 DIAGNOSIS — I7090 Unspecified atherosclerosis: Secondary | ICD-10-CM | POA: Diagnosis not present

## 2018-07-31 DIAGNOSIS — Z0000 Encounter for general adult medical examination without abnormal findings: Secondary | ICD-10-CM

## 2018-07-31 DIAGNOSIS — K219 Gastro-esophageal reflux disease without esophagitis: Secondary | ICD-10-CM | POA: Diagnosis not present

## 2018-07-31 DIAGNOSIS — D72829 Elevated white blood cell count, unspecified: Secondary | ICD-10-CM

## 2018-07-31 MED ORDER — OMEPRAZOLE 20 MG PO CPDR: 20.0000 mg | DELAYED_RELEASE_CAPSULE | Freq: Every day | ORAL | 3 refills | Status: DC

## 2018-07-31 MED ORDER — FERROUS SULFATE 325 (65 FE) MG PO TABS: 325.0000 mg | ORAL_TABLET | Freq: Two times a day (BID) | ORAL | 3 refills | Status: DC

## 2018-07-31 MED ORDER — ATORVASTATIN CALCIUM 20 MG PO TABS: 20.0000 mg | ORAL_TABLET | Freq: Every day | ORAL | 3 refills | Status: DC

## 2018-07-31 NOTE — Telephone Encounter (Signed)
Patient has scheduled lab and follow up appt.

## 2018-07-31 NOTE — Progress Notes (Signed)
Telephone Note  I connected with Wanda Nelson   on 07/31/18 at  2:00 PM EDT by a telephone application and verified that I am speaking with the correct person using two identifiers.  Location patient: home Location provider:work or home office Persons participating in the virtual visit: patient, provider  I discussed the limitations of evaluation and management by telemedicine and the availability of in person appointments. The patient expressed understanding and agreed to proceed.   HPI: 1. Atrial myoma 2.4 x 2.5 cm and had surgery 03/2018 Dr. Roxy Manns doing well except for right breast is swollen and sore this is the side that she had an incision on. She reports it Is not painful and supposed to completely heal by 6 months from surgery per cardiology.   She is no longer having sob and exercise has increased walking 2 miles per day in 40 minutes also denies cough  2. Large hiatal hernia with chronic anemia she needs refill of iron 325 mg bid and prilosec 20 mg qd 3. Atherosclerosis on lipitor 20 mg qhs doing well last lipid panel 12/11/17 LDL 86 BP today 126/62 and HR 58   ROS: See pertinent positives and negatives per HPI.  Past Medical History:  Diagnosis Date  . Anemia   . Glaucoma   . History of blood transfusion   . History of hiatal hernia   . Hyperlipidemia   . Incidental pulmonary nodule, > 64mm and < 50mm 03/20/2018   Noted on CT scan - needs f/u imaging 6 months  . 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 Stroke [facility])     Past Surgical History:  Procedure Laterality Date  . ABDOMINAL HYSTERECTOMY  1982   total  . BUNIONECTOMY Bilateral   . CLIPPING OF ATRIAL APPENDAGE  03/21/2018   Procedure: Clipping Of Atrial Appendage - using an AtriCure 60mm clip;  Surgeon: Rexene Alberts, MD;  Location: [facility] OR;  Service: Open Heart Surgery;;  . EXCISION OF ATRIAL MYXOMA N/A 03/21/2018   Procedure: Excision Of Atrial Myxoma;  Surgeon: Rexene Alberts, MD;  Location: [place];   Service: Open Heart Surgery;  Laterality: N/A;  . RECTOCELE REPAIR    . RIGHT/LEFT HEART CATH AND CORONARY ANGIOGRAPHY N/A 03/19/2018   Procedure: RIGHT/LEFT HEART CATH AND CORONARY ANGIOGRAPHY;  Surgeon: Wellington Hampshire, MD;  Location: [place] CV LAB;  Service: Cardiovascular;  Laterality: N/A;  . TEE WITHOUT CARDIOVERSION N/A 03/21/2018   Procedure: TRANSESOPHAGEAL ECHOCARDIOGRAM (TEE);  Surgeon: Rexene Alberts, MD;  Location: [place];  Service: Open Heart Surgery;  Laterality: N/A;  . TONSILLECTOMY      Family History  Problem Relation Age of Onset  . Colon cancer Brother   . Colon cancer Maternal Uncle   . 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  acetaminophen (TYLENOL) 325 MG tablet, Take 2 tablets (650 mg total) by mouth every 6 (six) hours as needed for moderate pain., Disp: , Rfl:  .  aspirin EC 325 MG EC tablet, Take 1 tablet (325 mg total) by mouth daily., Disp: , Rfl:  .  atorvastatin (LIPITOR) 20 MG tablet, Take 1 tablet (20 mg total) by mouth daily at 6 PM., Disp: 90 tablet, Rfl: 3 .  Calcium Carbonate-Vitamin D (CALCIUM 500 + D) 500-125 MG-UNIT TABS, Take 1 tablet by mouth daily., Disp: , Rfl:  .  cholecalciferol (VITAMIN D) 1000  units tablet, Take 1,000 Units by mouth daily., Disp: , Rfl:  .  Docusate Sodium (STOOL SOFTENER) 100 MG capsule, Take 100 mg by mouth daily., Disp: , Rfl:  .  ferrous sulfate (FEROSUL) 325 (65 FE) MG tablet, Take 1 tablet (325 mg total) by mouth 2 (two) times daily with a meal., Disp: 180 tablet, Rfl: 3 .  Flaxseed, Linseed, (FLAX SEED OIL) 1300 MG CAPS, Take 1,100 mg by mouth daily. , Disp: , Rfl:  .  Multiple Vitamin (MULTIVITAMIN) tablet, Take 1 tablet by mouth daily., Disp: , Rfl:  .  Omega-3 Fatty Acids (FISH OIL) 1200 MG CAPS, Take 2,400 mg by mouth 2 (two) times daily., Disp: , Rfl:  .  omeprazole (PRILOSEC) 20 MG capsule, Take 1 capsule (20 mg  total) by mouth daily., Disp: 90 capsule, Rfl: 3 .  propranolol (INDERAL) 10 MG tablet, Take 1 tablet (10 mg total) by mouth 3 (three) times daily as needed., Disp: 90 tablet, Rfl: 1 .  psyllium (REGULOID) 0.52 g capsule, Take 0.52 g by mouth daily., Disp: , Rfl:   EXAM:  VITALS per patient if applicable:  GENERAL: alert, oriented, appears well and in no acute distress  PSYCH/NEURO: pleasant and cooperative, no obvious depression or anxiety, speech and thought processing grossly intact  ASSESSMENT AND PLAN:  Discussed the following assessment and plan:  Iron deficiency anemia, unspecified iron deficiency anemia type - Plan: CBC w/Diff, IBC + Ferritin, ferrous sulfate (FEROSUL) 325 (65 FE) MG tablet bid   Hyperlipidemia, atherosclerosis - Plan: Lipid panel, atorvastatin (LIPITOR) 20 MG tablet qhs   Leukocytosis, unspecified type - Plan: CBC w/Diff, IBC + Ferritin  Gastroesophageal reflux disease, esophagitis presence not specified - Plan: omeprazole (PRILOSEC) 20 MG capsule qd  Hiatal hernia, large - Plan: see above   S/p left atrial myxoma 03/2018 doing well no sob/cough F/u cards and CTS  HM Flu shot needed  Tdap consider in future  shingrix 2/2  Consider prevnar and pna 23 in future ask if has had at f/u   Out of age window pap Mammogram had in 2016 last consider b/l dx mammo and [place] will disc with pt 09/2018 right breast red and sore since heart surgery 03/2018 pt wants to wait until 09/2018 before ordering mammo/US if needed  Colonoscopy/EGD in 2014 [place] colonoscopy normal EGD large H/H per pt DEXA 2015/2016 [facility] normal per pt  Skin-left shoulder has rough scaly lesion appt dermatologist 08/2018 [facility] Dr. Nehemiah Massed    I discussed the assessment and treatment plan with the patient. The patient was provided an opportunity to ask questions and all were answered. The patient agreed with the plan and demonstrated an understanding of the  instructions.   The patient was advised to call back or seek an in-person evaluation if the symptoms worsen or if the condition fails to improve as anticipated.  Time spent 25 minutes  Delorise Jackson, MD

## 2018-07-31 NOTE — Telephone Encounter (Signed)
Notes recorded by Vanessa Ralphs, RN on 07/31/2018 at 10:09 AM EDT  No answer. Left detail message with results, ok per DPR, and to call back if any questions.  Patient has also viewed the results on MyChart.

## 2018-07-31 NOTE — Telephone Encounter (Signed)
Results called to [name], ok per DPR, and she verbalized understanding.

## 2018-07-31 NOTE — Telephone Encounter (Signed)
Patient request results be called to daughter in law anita

## 2018-07-31 NOTE — Telephone Encounter (Signed)
Left message for patient to return call back. PEC may give  information. Fasting lab appointment  3 September then follow up with Dr Olivia Mackie afterward.

## 2018-08-14 DIAGNOSIS — D1801 Hemangioma of skin and subcutaneous tissue: Secondary | ICD-10-CM | POA: Diagnosis not present

## 2018-08-14 DIAGNOSIS — L820 Inflamed seborrheic keratosis: Secondary | ICD-10-CM | POA: Diagnosis not present

## 2018-08-14 DIAGNOSIS — Z1283 Encounter for screening for malignant neoplasm of skin: Secondary | ICD-10-CM | POA: Diagnosis not present

## 2018-08-14 DIAGNOSIS — L814 Other melanin hyperpigmentation: Secondary | ICD-10-CM | POA: Diagnosis not present

## 2018-08-14 DIAGNOSIS — L578 Other skin changes due to chronic exposure to nonionizing radiation: Secondary | ICD-10-CM | POA: Diagnosis not present

## 2018-08-14 DIAGNOSIS — L821 Other seborrheic keratosis: Secondary | ICD-10-CM | POA: Diagnosis not present

## 2018-08-14 DIAGNOSIS — L720 Epidermal cyst: Secondary | ICD-10-CM | POA: Diagnosis not present

## 2018-08-14 DIAGNOSIS — D692 Other nonthrombocytopenic purpura: Secondary | ICD-10-CM | POA: Diagnosis not present

## 2018-09-09 ENCOUNTER — Other Ambulatory Visit: Payer: Self-pay

## 2018-09-09 ENCOUNTER — Encounter: Payer: Self-pay | Admitting: Cardiovascular Disease

## 2018-09-09 ENCOUNTER — Ambulatory Visit (INDEPENDENT_AMBULATORY_CARE_PROVIDER_SITE_OTHER): Payer: Medicare Other | Admitting: Cardiovascular Disease

## 2018-09-09 VITALS — BP 130/70 | HR 64 | Ht 62.0 in | Wt 118.0 lb

## 2018-09-09 DIAGNOSIS — E785 Hyperlipidemia, unspecified: Secondary | ICD-10-CM | POA: Diagnosis not present

## 2018-09-09 DIAGNOSIS — D151 Benign neoplasm of heart: Secondary | ICD-10-CM | POA: Diagnosis not present

## 2018-09-09 DIAGNOSIS — I7090 Unspecified atherosclerosis: Secondary | ICD-10-CM

## 2018-09-09 MED ORDER — ASPIRIN EC 81 MG PO TBEC: 81.0000 mg | DELAYED_RELEASE_TABLET | Freq: Every day | ORAL | Status: DC

## 2018-09-09 NOTE — Progress Notes (Signed)
Cardiology Office Note   Date:  09/09/2018   ID:  Wanda, Nelson Apr 02, 1931, MRN [id number]  PCP:  McLean-Scocuzza, Nino Glow, MD  Cardiologist:   [name], MD   Chief Complaint  Patient presents with  . other    3 mo f/u echo in July. Medications reviewed verbally.       History of Present Illness: Wanda Nelson is a 85 y.o. female who presents for a follow-up visit post atrial myxoma resection. She has known history of anemia, hiatal hernia, hyperlipidemia and remote history of hemorrhagic stroke.  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  Cardiac catheterization before surgery showed no evidence of obstructive coronary artery disease.   She had postoperative tongue swelling and GI symptoms.  She also had issues with bradycardia that gradually improved. She had intermittent palpitations and tachycardia shortly after surgery due to short runs of nonsustained ventricular tachycardia and intermittent A. fib with RVR. She did not require treatment for this and her symptoms resolved without intervention.  She has been doing extremely well with no shortness of breath or palpitations.  She continues to have some discomfort at the surgical incision site.  She is walking 2 miles daily and has not had to use any inhalers.  We did an echocardiogram in July which showed no evidence of recurrent myxoma.  EF was normal.   Past Medical History:  Diagnosis Date  . Anemia   . Glaucoma   . History of blood transfusion   . History of hiatal hernia   . Hyperlipidemia   . Incidental pulmonary nodule, > 64mm and < 66mm 03/20/2018   Noted on CT scan - needs f/u imaging 6 months  . 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 Stroke [facility])     Past Surgical History:  Procedure Laterality Date  . ABDOMINAL HYSTERECTOMY  1982   total  . BUNIONECTOMY Bilateral   . CLIPPING OF ATRIAL APPENDAGE   03/21/2018   Procedure: Clipping Of Atrial Appendage - using an AtriCure 51mm clip;  Surgeon: Rexene Alberts, MD;  Location: [facility] OR;  Service: Open Heart Surgery;;  . EXCISION OF ATRIAL MYXOMA N/A 03/21/2018   Procedure: Excision Of Atrial Myxoma;  Surgeon: Rexene Alberts, MD;  Location: [place];  Service: Open Heart Surgery;  Laterality: N/A;  . RECTOCELE REPAIR    . RIGHT/LEFT HEART CATH AND CORONARY ANGIOGRAPHY N/A 03/19/2018   Procedure: RIGHT/LEFT HEART CATH AND CORONARY ANGIOGRAPHY;  Surgeon: Wellington Hampshire, MD;  Location: [place] CV LAB;  Service: Cardiovascular;  Laterality: N/A;  . TEE WITHOUT CARDIOVERSION N/A 03/21/2018   Procedure: TRANSESOPHAGEAL ECHOCARDIOGRAM (TEE);  Surgeon: Rexene Alberts, MD;  Location: [place];  Service: Open Heart Surgery;  Laterality: N/A;  . TONSILLECTOMY       Current Outpatient Medications  Medication Sig Dispense Refill  . aspirin EC 325 MG EC tablet Take 1 tablet (325 mg total) by mouth daily.    Marland Kitchen atorvastatin (LIPITOR) 20 MG tablet Take 1 tablet (20 mg total) by mouth daily at 6 PM. 90 tablet 3  . Calcium Carbonate-Vitamin D (CALCIUM 500 + D) 500-125 MG-UNIT TABS Take 1 tablet by mouth daily.    . cholecalciferol (VITAMIN D) 1000 units tablet Take 1,000 Units by mouth daily.    Mariane Baumgarten Sodium (STOOL SOFTENER) 100 MG capsule Take 100 mg by mouth daily.    Marland Kitchen  ferrous sulfate (FEROSUL) 325 (65 FE) MG tablet Take 1 tablet (325 mg total) by mouth 2 (two) times daily with a meal. 180 tablet 3  . Flaxseed, Linseed, (FLAX SEED OIL) 1300 MG CAPS Take 1,100 mg by mouth daily.     . Multiple Vitamin (MULTIVITAMIN) tablet Take 1 tablet by mouth daily.    . Omega-3 Fatty Acids (FISH OIL) 1200 MG CAPS Take 2,400 mg by mouth 2 (two) times daily.    Marland Kitchen omeprazole (PRILOSEC) 20 MG capsule Take 1 capsule (20 mg total) by mouth daily. 90 capsule 3  . acetaminophen (TYLENOL) 325 MG tablet Take 2 tablets (650 mg total) by mouth every 6 (six) hours as needed for  moderate pain.     No current facility-administered medications for this visit.     Allergies:   Bee venom, Codeine, Morphine and related, Shellfish allergy, and Nitrofurantoin    Social History:  The patient  reports that she has never smoked. She has never used smokeless tobacco. She reports that she does not drink alcohol or use drugs.   Family History:  The patient's family history includes Colon cancer in her brother, maternal grandmother, and maternal uncle.    ROS:  Please see the history of present illness.   Otherwise, review of systems are positive for none.   All other systems are reviewed and negative.    PHYSICAL EXAM: VS:  BP 130/70 (BP Location: Left Arm, Patient Position: Sitting, Cuff Size: Normal)   Pulse 64   Ht 5\' 2"  (1.575 m)   Wt 118 lb (53.5 kg)   SpO2 97%   BMI 21.58 kg/m  , BMI Body mass index is 21.58 kg/m. GEN: Well nourished, well developed, in no acute distress  HEENT: normal  Neck: no JVD, carotid bruits, or masses Cardiac: RRR; no murmurs, rubs, or gallops,no edema  Respiratory:  clear to auscultation bilaterally, normal work of breathing GI: soft, nontender, nondistended, + BS MS: no deformity or atrophy  Skin: warm and dry, no rash Neuro:  Strength and sensation are intact Psych: euthymic mood, full affect   EKG:  EKG is ordered today. The ekg ordered today demonstrates sinus rhythm with left atrial enlargement.   Recent Labs: 02/14/2018: NT-Pro BNP 110 03/27/2018: Magnesium 1.9 03/31/2018: ALT 36; BUN 24; Creatinine, Ser 0.92; Hemoglobin 10.2; Platelets 282; Potassium 4.3; Sodium 138    Lipid Panel    Component Value Date/Time   CHOL 154 12/11/2017 1436   TRIG 79.0 12/11/2017 1436   HDL 52.80 12/11/2017 1436   CHOLHDL 3 12/11/2017 1436   VLDL 15.8 12/11/2017 1436   LDLCALC 86 12/11/2017 1436      Wt Readings from Last 3 Encounters:  09/09/18 118 lb (53.5 kg)  05/19/18 118 lb (53.5 kg)  05/16/18 117 lb (53.1 kg)      No  flowsheet data found.    ASSESSMENT AND PLAN:  1.  Status post surgical resection of atrial myxoma: She is doing extremely well.  No evidence of recurrent atrial myxoma on echocardiogram in July.  Decrease aspirin to 81 mg daily.  2.  Postoperative atrial fibrillation: She reports no further palpitations or tachycardia.  She did not require any medication for this.  3.  Hyperlipidemia: Currently on atorvastatin.    Disposition:   FU with me in 6 months  Signed,  [name], MD  09/09/2018 4:15 PM    [place]

## 2018-09-09 NOTE — Patient Instructions (Signed)
Medication Instructions:  Your physician has recommended you make the following change in your medication:  1) REDUCE Aspirin to 81mg  daily  If you need a refill on your cardiac medications before your next appointment, please call your pharmacy.   Lab work: None ordered If you have labs (blood work) drawn today and your tests are completely normal, you will receive your results only by: Marland Kitchen MyChart Message (if you have MyChart) OR . A paper copy in the mail If you have any lab test that is abnormal or we need to change your treatment, we will call you to review the results.  Testing/Procedures: None ordered  Follow-Up: At [facility], you and your health needs are our priority.  As part of our continuing mission to provide you with exceptional heart care, we have created designated Provider Care Teams.  These Care Teams include your primary Cardiologist (physician) and Advanced Practice Providers (APPs -  Physician Assistants and Nurse Practitioners) who all work together to provide you with the care you need, when you need it. You will need a follow up appointment in 6 months.  Please call our office 2 months in advance to schedule this appointment.  You may see [name], MD or one of the following Advanced Practice Providers on your designated Care Team:   Murray Hodgkins, NP Christell Faith, PA-C . Marrianne Mood, PA-C  Any Other Special Instructions Will Be Listed Below (If Applicable). N/A

## 2018-09-19 DIAGNOSIS — H401112 Primary open-angle glaucoma, right eye, moderate stage: Secondary | ICD-10-CM | POA: Diagnosis not present

## 2018-09-19 DIAGNOSIS — H02883 Meibomian gland dysfunction of right eye, unspecified eyelid: Secondary | ICD-10-CM | POA: Diagnosis not present

## 2018-09-19 DIAGNOSIS — H401123 Primary open-angle glaucoma, left eye, severe stage: Secondary | ICD-10-CM | POA: Diagnosis not present

## 2018-09-19 DIAGNOSIS — H02886 Meibomian gland dysfunction of left eye, unspecified eyelid: Secondary | ICD-10-CM | POA: Diagnosis not present

## 2018-09-19 DIAGNOSIS — Z961 Presence of intraocular lens: Secondary | ICD-10-CM | POA: Diagnosis not present

## 2018-09-24 ENCOUNTER — Other Ambulatory Visit: Payer: Self-pay

## 2018-09-24 ENCOUNTER — Other Ambulatory Visit (INDEPENDENT_AMBULATORY_CARE_PROVIDER_SITE_OTHER): Payer: Medicare Other

## 2018-09-24 DIAGNOSIS — D72829 Elevated white blood cell count, unspecified: Secondary | ICD-10-CM

## 2018-09-24 DIAGNOSIS — Z1329 Encounter for screening for other suspected endocrine disorder: Secondary | ICD-10-CM

## 2018-09-24 DIAGNOSIS — E785 Hyperlipidemia, unspecified: Secondary | ICD-10-CM

## 2018-09-24 DIAGNOSIS — D509 Iron deficiency anemia, unspecified: Secondary | ICD-10-CM

## 2018-09-24 DIAGNOSIS — Z0000 Encounter for general adult medical examination without abnormal findings: Secondary | ICD-10-CM | POA: Diagnosis not present

## 2018-09-24 LAB — COMPREHENSIVE METABOLIC PANEL
ALT: 19 U/L (ref 0–35)
AST: 22 U/L (ref 0–37)
Albumin: 4.1 g/dL (ref 3.5–5.2)
Alkaline Phosphatase: 96 U/L (ref 39–117)
BUN: 17 mg/dL (ref 6–23)
CO2: 28 mEq/L (ref 19–32)
Calcium: 9.8 mg/dL (ref 8.4–10.5)
Chloride: 104 mEq/L (ref 96–112)
Creatinine, Ser: 0.82 mg/dL (ref 0.40–1.20)
GFR: 66.18 mL/min (ref >60.00)
Glucose, Bld: 82 mg/dL (ref 70–99)
Potassium: 3.9 mEq/L (ref 3.5–5.1)
Sodium: 139 mEq/L (ref 135–145)
Total Bilirubin: 0.6 mg/dL (ref 0.2–1.2)
Total Protein: 7.6 g/dL (ref 6.0–8.3)

## 2018-09-24 LAB — CBC WITH DIFFERENTIAL/PLATELET
Basophils Absolute: 0.1 10*3/uL (ref 0.0–0.1)
Basophils Relative: 1 % (ref 0.0–3.0)
Eosinophils Absolute: 0.3 10*3/uL (ref 0.0–0.7)
Eosinophils Relative: 5.1 % — ABNORMAL HIGH (ref 0.0–5.0)
HCT: 46.3 % — ABNORMAL HIGH (ref 36.0–46.0)
Hemoglobin: 15.2 g/dL — ABNORMAL HIGH (ref 12.0–15.0)
Lymphocytes Relative: 23.3 % (ref 12.0–46.0)
Lymphs Abs: 1.4 10*3/uL (ref 0.7–4.0)
MCHC: 32.8 g/dL (ref 30.0–36.0)
MCV: 97.8 fl (ref 78.0–100.0)
Monocytes Absolute: 0.5 10*3/uL (ref 0.1–1.0)
Monocytes Relative: 7.3 % (ref 3.0–12.0)
Neutro Abs: 3.9 10*3/uL (ref 1.4–7.7)
Neutrophils Relative %: 63.3 % (ref 43.0–77.0)
Platelets: 146 10*3/uL — ABNORMAL LOW (ref 150.0–400.0)
RBC: 4.73 Mil/uL (ref 3.87–5.11)
RDW: 14 % (ref 11.5–15.5)
WBC: 6.2 10*3/uL (ref 4.0–10.5)

## 2018-09-24 LAB — TSH: TSH: 1.87 u[IU]/mL (ref 0.35–4.50)

## 2018-09-24 LAB — LIPID PANEL
Cholesterol: 163 mg/dL (ref 0–200)
HDL: 60.5 mg/dL (ref >39.00)
LDL Cholesterol: 89 mg/dL (ref 0–99)
NonHDL: 102.6
Total CHOL/HDL Ratio: 3
Triglycerides: 66 mg/dL (ref 0.0–149.0)
VLDL: 13.2 mg/dL (ref 0.0–40.0)

## 2018-09-24 LAB — IBC + FERRITIN
Ferritin: 113.7 ng/mL (ref 10.0–291.0)
Iron: 90 ug/dL (ref 42–145)
Saturation Ratios: 29.9 % (ref 20.0–50.0)
Transferrin: 215 mg/dL (ref 212.0–360.0)

## 2018-09-26 ENCOUNTER — Ambulatory Visit (INDEPENDENT_AMBULATORY_CARE_PROVIDER_SITE_OTHER): Payer: Medicare Other | Admitting: Internal Medicine

## 2018-09-26 ENCOUNTER — Other Ambulatory Visit: Payer: Self-pay

## 2018-09-26 ENCOUNTER — Encounter: Payer: Self-pay | Admitting: Internal Medicine

## 2018-09-26 VITALS — Ht 62.0 in | Wt 117.0 lb

## 2018-09-26 DIAGNOSIS — D696 Thrombocytopenia, unspecified: Secondary | ICD-10-CM | POA: Diagnosis not present

## 2018-09-26 DIAGNOSIS — D751 Secondary polycythemia: Secondary | ICD-10-CM

## 2018-09-26 DIAGNOSIS — E785 Hyperlipidemia, unspecified: Secondary | ICD-10-CM

## 2018-09-26 MED ORDER — ATORVASTATIN CALCIUM 20 MG PO TABS: 20.0000 mg | ORAL_TABLET | Freq: Every day | ORAL | 3 refills | Status: DC

## 2018-09-26 NOTE — Progress Notes (Addendum)
Telephone Note  I connected with Wanda Nelson   on 09/26/18 at 10:00 AM EDT by a telephone and verified that I am speaking with the correct person using two identifiers.  Location patient: home Location provider:work or home office Persons participating in the virtual visit: patient, provider  I discussed the limitations of evaluation and management by telemedicine and the availability of in person appointments. The patient expressed understanding and agreed to proceed.   HPI: 1. Right breast issue cleared up  2. Reviewed labs cbc off plts low and H/H elevated she is taking iron bid  3. Exercising 3 miles per day doing well     ROS: See pertinent positives and negatives per HPI.  Past Medical History:  Diagnosis Date  . Anemia   . Glaucoma   . History of blood transfusion   . History of hiatal hernia   . Hyperlipidemia   . Incidental pulmonary nodule, > 15mm and < 35mm 03/20/2018   Noted on CT scan - needs f/u imaging 6 months  . 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 Stroke [facility])     Past Surgical History:  Procedure Laterality Date  . ABDOMINAL HYSTERECTOMY  1982   total  . BUNIONECTOMY Bilateral   . CLIPPING OF ATRIAL APPENDAGE  03/21/2018   Procedure: Clipping Of Atrial Appendage - using an AtriCure 35mm clip;  Surgeon: Rexene Alberts, MD;  Location[facility] OR;  Service: Open Heart Surgery;;  . EXCISION OF ATRIAL MYXOMA N/A 03/21/2018   Procedure: Excision Of Atrial Myxoma;  Surgeon: Rexene Alberts, MD;  Location: [place];  Service: Open Heart Surgery;  Laterality: N/A;  . RECTOCELE REPAIR    . RIGHT/LEFT HEART CATH AND CORONARY ANGIOGRAPHY N/A 03/19/2018   Procedure: RIGHT/LEFT HEART CATH AND CORONARY ANGIOGRAPHY;  Surgeon: Wellington Hampshire, MD;  Location: [place] CV LAB;  Service: Cardiovascular;  Laterality: N/A;  . TEE WITHOUT CARDIOVERSION N/A 03/21/2018   Procedure: TRANSESOPHAGEAL ECHOCARDIOGRAM (TEE);  Surgeon: Rexene Alberts, MD;  Location: [place];   Service: Open Heart Surgery;  Laterality: N/A;  . TONSILLECTOMY      Family History  Problem Relation Age of Onset  . Colon cancer Brother   . Colon cancer Maternal Uncle   . Colon cancer Maternal Grandmother     SOCIAL HX:  Lives [place] with husband and she is caregiver Daughter in law Reannon Urban   Current Outpatient Medications:  .  acetaminophen (TYLENOL) 325 MG tablet, Take 2 tablets (650 mg total) by mouth every 6 (six) hours as needed for moderate pain., Disp: , Rfl:  .  aspirin EC 81 MG tablet, Take 1 tablet (81 mg total) by mouth daily., Disp: , Rfl:  .  atorvastatin (LIPITOR) 20 MG tablet, Take 1 tablet (20 mg total) by mouth daily at 6 PM., Disp: 90 tablet, Rfl: 3 .  Calcium Carbonate-Vitamin D (CALCIUM 500 + D) 500-125 MG-UNIT TABS, Take 1 tablet by mouth daily., Disp: , Rfl:  .  cholecalciferol (VITAMIN D) 1000 units tablet, Take 1,000 Units by mouth daily., Disp: , Rfl:  .  Docusate Sodium (STOOL SOFTENER) 100 MG capsule, Take 100 mg by mouth daily., Disp: , Rfl:  .  ferrous sulfate (FEROSUL) 325 (65 FE) MG tablet, Take 1 tablet (325 mg total) by mouth 2 (two) times daily with a meal. (Patient taking differently: Take 325 mg by mouth daily with breakfast. ), Disp: 180 tablet, Rfl: 3 .  Flaxseed, Linseed, (FLAX SEED OIL)  1300 MG CAPS, Take 1,100 mg by mouth daily. , Disp: , Rfl:  .  Multiple Vitamin (MULTIVITAMIN) tablet, Take 1 tablet by mouth daily., Disp: , Rfl:  .  Omega-3 Fatty Acids (FISH OIL) 1200 MG CAPS, Take 2,400 mg by mouth 2 (two) times daily., Disp: , Rfl:  .  omeprazole (PRILOSEC) 20 MG capsule, Take 1 capsule (20 mg total) by mouth daily., Disp: 90 capsule, Rfl: 3  EXAM:  VITALS per patient if applicable:  GENERAL: alert, oriented, appears well and in no acute distress  HEENT: atraumatic, conjunttiva clear, no obvious abnormalities on inspection of external nose and ears  NECK: normal movements of the head and neck  LUNGS: on inspection no signs  of respiratory distress, breathing rate appears normal, no obvious gross SOB, gasping or wheezing  CV: no obvious cyanosis  MS: moves all visible extremities without noticeable abnormality  PSYCH/NEURO: pleasant and cooperative, no obvious depression or anxiety, speech and thought processing grossly intact  ASSESSMENT AND PLAN:  Discussed the following assessment and plan:  Polycythemia - Plan: CBC with Differential/Platelet, Iron, TIBC and Ferritin Panel -if abnormal will refer h/o   Hyperlipidemia, unspecified hyperlipidemia type - Plan: atorvastatin (LIPITOR) 20 MG tablet  Thrombocytopenia (HCC) - Plan: CBC with Differential/Platelet  HM Flu shot needed will get at [place]  Tdap consider in future  shingrix 2/2  Had 11/162016 prevna/r and pna 23 ? If had no record from [place] will need in future -had prevnar in 2016 in [place] of age window pap Mammogram had in 2016 last consider b/l dx mammo and [place] will disc with pt 09/2018 right breast red and sore since heart surgery 03/2018 pt wants to wait until 09/2018 before ordering mammo/US if needed but resolved as of 09/26/18 so no need to order   Colonoscopy/EGD in 2014 [facility]oscopy normal EGD large H/H per pt  DEXA 2015/2016 [facility] normal per pt   Skin-left shoulder has rough scaly lesion appt dermatologist seen  -08/2018 [facility] [place] Dr. Nehemiah Massed   -we discussed possible serious and likely etiologies, options for evaluation and workup, limitations of telemedicine visit vs in person visit, treatment, treatment risks and precautions. Pt prefers to treat via telemedicine empirically rather then risking or undertaking an in person visit at this moment. Patient agrees to seek prompt in person care if worsening, new symptoms arise, or if is not improving with treatment.   I discussed the assessment and treatment plan with the patient. The patient was provided an  opportunity to ask questions and all were answered. The patient agreed with the plan and demonstrated an understanding of the instructions.   The patient was advised to call back or seek an in-person evaluation if the symptoms worsen or if the condition fails to improve as anticipated.  Time spent 15 minutes  Delorise Jackson, MD

## 2018-10-01 NOTE — Progress Notes (Signed)
Patient only has prevnar completed

## 2018-10-01 NOTE — Progress Notes (Signed)
Noted will need pna 23 in the future  [place]

## 2018-10-04 ENCOUNTER — Ambulatory Visit (INDEPENDENT_AMBULATORY_CARE_PROVIDER_SITE_OTHER): Payer: Medicare Other | Admitting: Family Medicine

## 2018-10-04 ENCOUNTER — Other Ambulatory Visit: Payer: Self-pay

## 2018-10-04 DIAGNOSIS — N390 Urinary tract infection, site not specified: Secondary | ICD-10-CM | POA: Insufficient documentation

## 2018-10-04 DIAGNOSIS — N3000 Acute cystitis without hematuria: Secondary | ICD-10-CM

## 2018-10-04 MED ORDER — CIPROFLOXACIN HCL 250 MG PO TABS: 250.0000 mg | ORAL_TABLET | Freq: Two times a day (BID) | ORAL | 0 refills | Status: DC

## 2018-10-04 NOTE — Assessment & Plan Note (Signed)
Symptoms are consistent with UTI.  Patient is requesting ciprofloxacin.  I discussed the short-term and long-term risk of neuropathy, tendinitis, and tendon rupture with use of this medication.  She understood the risk of these and still requested this.  Ciprofloxacin was sent to the pharmacy.  Creatinine clearance should tolerate 250 mg twice daily dosing.  She is given reasons to seek medical attention over the weekend.  Advised if not improving with this she should let us know early next week.

## 2018-10-04 NOTE — Progress Notes (Signed)
Virtual Visit via telephone Note  This visit type was conducted due to national recommendations for restrictions regarding the COVID-19 pandemic (e.g. social distancing).  This format is felt to be most appropriate for this patient at this time.  All issues noted in this document were discussed and addressed.  No physical exam was performed (except for noted visual exam findings with Video Visits).   I connected with Wanda Nelson today at 10:00 AM EDT by telephone and verified that I am speaking with the correct person using two identifiers. Location patient: home Location provider: work  Persons participating in the virtual visit: patient, provider  I discussed the limitations, risks, security and privacy concerns of performing an evaluation and management service by telephone and the availability of in person appointments. I also discussed with the patient that there may be a patient responsible charge related to this service. The patient expressed understanding and agreed to proceed.  Interactive audio and video telecommunications were attempted between this provider and patient, however failed, due to patient having technical difficulties OR patient did not have access to video capability.  We continued and completed visit with audio only.  Reason for visit: same day visit - UTI symptoms  HPI: UTI: Patient notes onset of dysuria, urinary frequency, and urgency starting last night.  She notes no hematuria, abdominal pain, vaginal discharge, or fevers.  She started drinking more water.  She has a history of UTIs and this feels consistent with that.  She is requesting ciprofloxacin for this as she knows this will knock it out.   ROS: See pertinent positives and negatives per HPI.  Past Medical History:  Diagnosis Date  . Anemia   . Glaucoma   . History of blood transfusion   . History of hiatal hernia   . Hyperlipidemia   . Incidental pulmonary nodule, > 27mm and < 33mm 03/20/2018    Noted on CT scan - needs f/u imaging 6 months  . 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 Stroke [facility])     Past Surgical History:  Procedure Laterality Date  . ABDOMINAL HYSTERECTOMY  1982   total  . BUNIONECTOMY Bilateral   . CLIPPING OF ATRIAL APPENDAGE  03/21/2018   Procedure: Clipping Of Atrial Appendage - using an AtriCure 39mm clip;  Surgeon: Rexene Alberts, MD;  [facility];  Service: Open Heart Surgery;;  . EXCISION OF ATRIAL MYXOMA N/A 03/21/2018   Procedure: Excision Of Atrial Myxoma;  Surgeon: Rexene Alberts, MD;  Location: [place];  Service: Open Heart Surgery;  Laterality: N/A;  . RECTOCELE REPAIR    . RIGHT/LEFT HEART CATH AND CORONARY ANGIOGRAPHY N/A 03/19/2018   Procedure: RIGHT/LEFT HEART CATH AND CORONARY ANGIOGRAPHY;  Surgeon: Wellington Hampshire, MD;  Location: [place] CV LAB;  Service: Cardiovascular;  Laterality: N/A;  . TEE WITHOUT CARDIOVERSION N/A 03/21/2018   Procedure: TRANSESOPHAGEAL ECHOCARDIOGRAM (TEE);  Surgeon: Rexene Alberts, MD;  Location: [place];  Service: Open Heart Surgery;  Laterality: N/A;  . TONSILLECTOMY      Family History  Problem Relation Age of Onset  . Colon cancer Brother   . Colon cancer Maternal Uncle   . Colon cancer Maternal Grandmother     SOCIAL HX: Non-smoker.   Current Outpatient Medications:  .  acetaminophen (TYLENOL) 325 MG tablet, Take 2 tablets (650 mg total) by mouth every 6 (six) hours as needed for moderate pain., Disp: , Rfl:  .  aspirin EC 81 MG tablet, Take  1 tablet (81 mg total) by mouth daily., Disp: , Rfl:  .  atorvastatin (LIPITOR) 20 MG tablet, Take 1 tablet (20 mg total) by mouth daily at 6 PM., Disp: 90 tablet, Rfl: 3 .  Calcium Carbonate-Vitamin D (CALCIUM 500 + D) 500-125 MG-UNIT TABS, Take 1 tablet by mouth daily., Disp: , Rfl:  .  cholecalciferol (VITAMIN D) 1000 units tablet, Take 1,000 Units by mouth daily., Disp: , Rfl:  .  Docusate Sodium (STOOL SOFTENER) 100 MG capsule, Take 100 mg by  mouth daily., Disp: , Rfl:  .  ferrous sulfate (FEROSUL) 325 (65 FE) MG tablet, Take 1 tablet (325 mg total) by mouth 2 (two) times daily with a meal. (Patient taking differently: Take 325 mg by mouth daily with breakfast. ), Disp: 180 tablet, Rfl: 3 .  Flaxseed, Linseed, (FLAX SEED OIL) 1300 MG CAPS, Take 1,100 mg by mouth daily. , Disp: , Rfl:  .  Multiple Vitamin (MULTIVITAMIN) tablet, Take 1 tablet by mouth daily., Disp: , Rfl:  .  Omega-3 Fatty Acids (FISH OIL) 1200 MG CAPS, Take 2,400 mg by mouth 2 (two) times daily., Disp: , Rfl:  .  omeprazole (PRILOSEC) 20 MG capsule, Take 1 capsule (20 mg total) by mouth daily., Disp: 90 capsule, Rfl: 3 .  ciprofloxacin (CIPRO) 250 MG tablet, Take 1 tablet (250 mg total) by mouth 2 (two) times daily., Disp: 6 tablet, Rfl: 0  EXAM: This was a telehealth telephone visit and thus no physical exam was completed.  ASSESSMENT AND PLAN:  Discussed the following assessment and plan:  UTI (urinary tract infection) Symptoms are consistent with UTI.  Patient is requesting ciprofloxacin.  I discussed the short-term and long-term risk of neuropathy, tendinitis, and tendon rupture with use of this medication.  She understood the risk of these and still requested this.  Ciprofloxacin was sent to the pharmacy.  Creatinine clearance should tolerate 250 mg twice daily dosing.  She is given reasons to seek medical attention over the weekend.  Advised if not improving with this she should let us know early next week.    I discussed the assessment and treatment plan with the patient. The patient was provided an opportunity to ask questions and all were answered. The patient agreed with the plan and demonstrated an understanding of the instructions.   The patient was advised to call back or seek an in-person evaluation if the symptoms worsen or if the condition fails to improve as anticipated.  I provided 6 minutes of non-face-to-face time during this encounter.   Tommi Rumps, MD

## 2018-10-06 ENCOUNTER — Ambulatory Visit: Payer: Self-pay | Admitting: *Deleted

## 2018-10-06 ENCOUNTER — Other Ambulatory Visit: Payer: Self-pay | Admitting: Internal Medicine

## 2018-10-06 DIAGNOSIS — N3000 Acute cystitis without hematuria: Secondary | ICD-10-CM

## 2018-10-06 NOTE — Telephone Encounter (Signed)
Schedule urine lab please once shes done with antibiotics   [place]

## 2018-10-06 NOTE — Telephone Encounter (Signed)
Summary: .   Patient was taking 3 days of ciprofloxacin (CIPRO) 250 MG tablet [id number] . Today is the patient's last day of the medication. Does she need to schedule an appt to see if the infection is clear? Or drop off a specimen? Please advise.          Pt called to see if she needed to make an appointment or send a urine sample to the office when she is done with her antibiotic. She denies fever or any symptoms. Per notes from the last virtual/audio visit, she is to call if she feels like she still has symptoms or other concerns.  Advise to call for symptoms or concerns. She voiced understanding. Routing to LB at [facility] for review.

## 2018-10-07 ENCOUNTER — Other Ambulatory Visit: Payer: Medicare Other

## 2018-10-07 ENCOUNTER — Other Ambulatory Visit: Payer: Self-pay

## 2018-10-07 DIAGNOSIS — N3000 Acute cystitis without hematuria: Secondary | ICD-10-CM

## 2018-10-07 NOTE — Addendum Note (Signed)
Addended by: Leeanne Rio on: 10/07/2018 03:50 PM   Modules accepted: Orders

## 2018-10-07 NOTE — Telephone Encounter (Signed)
Patient has appointment today

## 2018-10-09 LAB — URINE CULTURE
MICRO NUMBER:: 934251
Result:: NO GROWTH
SPECIMEN QUALITY:: ADEQUATE

## 2018-10-09 LAB — URINALYSIS, ROUTINE W REFLEX MICROSCOPIC

## 2018-10-20 NOTE — Progress Notes (Signed)
Called and spoke with pharmacy staff at [facility] in [place], [place].  Spoke w/ Miranda.  Records show that pt had a Prevnar 13 vaccination on 11/24/2014.  No record of the pna 23.  Jeannetta Nap will fax over pharmacy record of Prevnar 13.

## 2018-10-29 ENCOUNTER — Other Ambulatory Visit: Payer: Self-pay

## 2018-10-29 ENCOUNTER — Other Ambulatory Visit (INDEPENDENT_AMBULATORY_CARE_PROVIDER_SITE_OTHER): Payer: Medicare Other

## 2018-10-29 DIAGNOSIS — D696 Thrombocytopenia, unspecified: Secondary | ICD-10-CM

## 2018-10-29 DIAGNOSIS — D751 Secondary polycythemia: Secondary | ICD-10-CM

## 2018-10-30 LAB — CBC WITH DIFFERENTIAL/PLATELET
Absolute Monocytes: 488 cells/uL (ref 200–950)
Basophils Absolute: 37 cells/uL (ref 0–200)
Basophils Relative: 0.5 %
Eosinophils Absolute: 266 cells/uL (ref 15–500)
Eosinophils Relative: 3.6 %
HCT: 44.8 % (ref 35.0–45.0)
Hemoglobin: 14.9 g/dL (ref 11.7–15.5)
Lymphs Abs: 1561 cells/uL (ref 850–3900)
MCH: 31.7 pg (ref 27.0–33.0)
MCHC: 33.3 g/dL (ref 32.0–36.0)
MCV: 95.3 fL (ref 80.0–100.0)
MPV: 14.2 fL — ABNORMAL HIGH (ref 7.5–12.5)
Monocytes Relative: 6.6 %
Neutro Abs: 5047 cells/uL (ref 1500–7800)
Neutrophils Relative %: 68.2 %
Platelets: 151 10*3/uL (ref 140–400)
RBC: 4.7 10*6/uL (ref 3.80–5.10)
RDW: 12.1 % (ref 11.0–15.0)
Total Lymphocyte: 21.1 %
WBC: 7.4 10*3/uL (ref 3.8–10.8)

## 2018-10-30 LAB — IRON,TIBC AND FERRITIN PANEL
%SAT: 31 % (calc) (ref 16–45)
Ferritin: 109 ng/mL (ref 16–288)
Iron: 87 ug/dL (ref 45–160)
TIBC: 281 mcg/dL (calc) (ref 250–450)

## 2018-11-07 ENCOUNTER — Telehealth: Payer: Self-pay | Admitting: *Deleted

## 2018-11-07 ENCOUNTER — Other Ambulatory Visit: Payer: Self-pay | Admitting: Internal Medicine

## 2018-11-07 DIAGNOSIS — N644 Mastodynia: Secondary | ICD-10-CM

## 2018-11-07 NOTE — Telephone Encounter (Signed)
Right breast sore and swollen where she had the surgery March and feels it stems from this incision. Patient said she had discussed with previously and PCP had suggested a Mammogram if the problem re-appeared.

## 2018-11-07 NOTE — Telephone Encounter (Signed)
Copied from [place] [id number]. Topic: General - Inquiry >> Nov 07, 2018  9:04 AM Richardo Priest, NT wrote: Reason for CRM: Pt called in stating she would like to inform PCP that her breast is slightly swollen and sore and believes it is in regards to the surgery she had in the past. Patient stated she was told to call back and have a mammogram ordered if this issue returned. Please advise.

## 2018-11-07 NOTE — Telephone Encounter (Signed)
Ordered mammogram   Please sch

## 2018-11-25 ENCOUNTER — Telehealth: Payer: Self-pay | Admitting: Cardiovascular Disease

## 2018-11-25 DIAGNOSIS — D151 Benign neoplasm of heart: Secondary | ICD-10-CM

## 2018-11-25 DIAGNOSIS — R0602 Shortness of breath: Secondary | ICD-10-CM

## 2018-11-25 NOTE — Telephone Encounter (Signed)
DPR on file. lmom for Wanda Nelson. Dr. Fletcher Anon is in agreement to order the repeat echo. Order for the echo is in [place]. Message fwd to scheduling to call to schedule the echo.

## 2018-11-25 NOTE — Telephone Encounter (Signed)
Repeat echocardiogram. 

## 2018-11-25 NOTE — Telephone Encounter (Signed)
DPR on file. Spoke with the patient's daughter in law [place].  [name] sts that over the last several weeks the patient has developed a cough and some sob that is similar to the symptoms the patient had prior  to the discovery of her atrial myxoma.  Patient denies weight gain, palpitations, chest pain. Her echo in July 2020 was fine. Normal EF with no evidence of recurrent atrial myxoma. [name] sts that prior the atrial myxoma it was thought that the patient's issues were pulmonary related. Patient was treated with inhaler with no relief. An echo as then ordered to r/o diastolic dysfunction.  [name] and the patient would like to know if an echo can be repeated to r/o recurrence of the tumor. Tomasa Hose that I will fwd an update to Dr. Fletcher Anon nd call back with his response/recommendation.

## 2018-11-25 NOTE — Telephone Encounter (Signed)
Patient family member [name] calling  Patient is experiencing some of the same issues prior to last surgery such as cough - possibly pulmonary related Wanted to check that there were no recurring issues Would like to know if patient could have a possible repeat ECHO Please call to discuss

## 2018-11-26 NOTE — Telephone Encounter (Signed)
Scheduled

## 2018-11-26 NOTE — Telephone Encounter (Signed)
Noted  

## 2018-12-02 ENCOUNTER — Ambulatory Visit
Admission: RE | Admit: 2018-12-02 | Discharge: 2018-12-02 | Disposition: A | Discharge status: Home / Self Care | Payer: Medicare Other | Source: Ambulatory Visit | Attending: Internal Medicine | Admitting: Internal Medicine

## 2018-12-02 DIAGNOSIS — R928 Other abnormal and inconclusive findings on diagnostic imaging of breast: Secondary | ICD-10-CM | POA: Diagnosis not present

## 2018-12-02 DIAGNOSIS — N644 Mastodynia: Secondary | ICD-10-CM | POA: Insufficient documentation

## 2018-12-02 DIAGNOSIS — N6489 Other specified disorders of breast: Secondary | ICD-10-CM | POA: Diagnosis not present

## 2018-12-18 ENCOUNTER — Other Ambulatory Visit: Payer: Self-pay

## 2018-12-18 ENCOUNTER — Telehealth: Payer: Self-pay | Admitting: Cardiovascular Disease

## 2018-12-18 ENCOUNTER — Ambulatory Visit (INDEPENDENT_AMBULATORY_CARE_PROVIDER_SITE_OTHER): Payer: Medicare Other

## 2018-12-18 DIAGNOSIS — D151 Benign neoplasm of heart: Secondary | ICD-10-CM | POA: Diagnosis not present

## 2018-12-18 DIAGNOSIS — Z86018 Personal history of other benign neoplasm: Secondary | ICD-10-CM

## 2018-12-18 DIAGNOSIS — R0602 Shortness of breath: Secondary | ICD-10-CM

## 2018-12-18 NOTE — Telephone Encounter (Signed)
Spoke with the patient's daughter in law [place]. Advised [name] that the patient's echo performed today has not yet been read by the reading physician.  Advised [name] that if something acute or emergent was seen during the echo the tech would have consulted the DOD before the patient would have been allowed to go home.  The patient lost her husband 3 weeks ago and has been under a lot of stress. Karrie Doffing that they will be contacted with the results as soon as they become available. [name] verbalized understanding and voiced appreciation for the call back.  Spoke with Alecia the echo tech that performed the test. Alecia sts that the patient was not given any information regarding the test and was told that she would be contacted soon with the results. Alecia sts that the patient was quite anxious during the exam.

## 2018-12-18 NOTE — Telephone Encounter (Signed)
Patient calling to discuss recent  Echo testing results   Please call daughter asap as patient was told something was wrong with imaging.

## 2018-12-21 NOTE — Telephone Encounter (Signed)
See result note.  

## 2018-12-22 NOTE — Telephone Encounter (Signed)
Patient daughter returning call  °

## 2018-12-22 NOTE — Telephone Encounter (Signed)
Called to give the patients daughter [name] the patient's echo results and Dr. Tyrell Antonio recommendation. [email or web address].

## 2018-12-22 NOTE — Telephone Encounter (Signed)
Patient's daughter [name] made aware of echo results and Dr. Tyrell Antonio recommendation with verbalized understanding. Order in [place] for 6 mo repeat echo. [name] voiced appreciation for the call.

## 2019-01-23 DIAGNOSIS — Z23 Encounter for immunization: Secondary | ICD-10-CM | POA: Diagnosis not present

## 2019-01-29 ENCOUNTER — Other Ambulatory Visit: Payer: Self-pay

## 2019-01-29 ENCOUNTER — Ambulatory Visit (INDEPENDENT_AMBULATORY_CARE_PROVIDER_SITE_OTHER): Payer: Medicare Other | Admitting: Internal Medicine

## 2019-01-29 ENCOUNTER — Encounter: Payer: Self-pay | Admitting: Internal Medicine

## 2019-01-29 VITALS — Ht 62.0 in | Wt 115.0 lb

## 2019-01-29 DIAGNOSIS — E7800 Pure hypercholesterolemia, unspecified: Secondary | ICD-10-CM | POA: Diagnosis not present

## 2019-01-29 DIAGNOSIS — D509 Iron deficiency anemia, unspecified: Secondary | ICD-10-CM

## 2019-01-29 DIAGNOSIS — K219 Gastro-esophageal reflux disease without esophagitis: Secondary | ICD-10-CM | POA: Diagnosis not present

## 2019-01-29 DIAGNOSIS — K449 Diaphragmatic hernia without obstruction or gangrene: Secondary | ICD-10-CM

## 2019-01-29 DIAGNOSIS — N3000 Acute cystitis without hematuria: Secondary | ICD-10-CM | POA: Diagnosis not present

## 2019-01-29 MED ORDER — OMEPRAZOLE 20 MG PO CPDR: 20.0000 mg | DELAYED_RELEASE_CAPSULE | Freq: Every day | ORAL | 3 refills | Status: DC

## 2019-01-29 NOTE — Progress Notes (Signed)
Telephone Note  I connected with Wanda Nelson  on 01/29/19 at 10:30 AM EST by a telephone and verified that I am speaking with the correct person using two identifiers.  Location patient: home Location provider:work or home office Persons participating in the virtual visit: patient, provider  I discussed the limitations of evaluation and management by telemedicine and the availability of in person appointments. The patient expressed understanding and agreed to proceed.   HPI: 1. pts husband died January 02, 2019 she is doing ok  2. Hiatal hernia with iron def anemia she needs refill of ppi and is taking iron 3. Still walking 3 miles qd recent echo s/p cardiac surgery ok and f/u CTS Dr. Roxy Manns sch 03/30/19  4. 1-2 weeks ago had burning with urination but sx's improved after cranberry and drinking more water will check urine   ROS: See pertinent positives and negatives per HPI. Resolved cough pt thinks could have been allergies  Past Medical History:  Diagnosis Date  . Anemia   . Glaucoma   . History of blood transfusion   . History of hiatal hernia   . Hyperlipidemia   . Incidental pulmonary nodule, > 6mm and < 22mm 03/20/2018   Noted on CT scan - needs f/u imaging 6 months  . 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 Stroke [facility])     Past Surgical History:  Procedure Laterality Date  . ABDOMINAL HYSTERECTOMY  1982   total  . BUNIONECTOMY Bilateral   . CLIPPING OF ATRIAL APPENDAGE  03/21/2018   Procedure: Clipping Of Atrial Appendage - using an AtriCure 34mm clip;  Surgeon: Rexene Alberts, MD;  Location: [facility] OR;  Service: Open Heart Surgery;;  . EXCISION OF ATRIAL MYXOMA N/A 03/21/2018   Procedure: Excision Of Atrial Myxoma;  Surgeon: Rexene Alberts, MD;  Location: [facility];  Service: Open Heart Surgery;  Laterality: N/A;  . RECTOCELE REPAIR    . RIGHT/LEFT HEART CATH AND CORONARY ANGIOGRAPHY N/A 03/19/2018   Procedure: RIGHT/LEFT HEART CATH AND CORONARY ANGIOGRAPHY;  Surgeon:  Wellington Hampshire, MD;  Location: [place] CV LAB;  Service: Cardiovascular;  Laterality: N/A;  . TEE WITHOUT CARDIOVERSION N/A 03/21/2018   Procedure: TRANSESOPHAGEAL ECHOCARDIOGRAM (TEE);  Surgeon: Rexene Alberts, MD;  Location: [place];  Service: Open Heart Surgery;  Laterality: N/A;  . TONSILLECTOMY      Family History  Problem Relation Age of Onset  . Colon cancer Brother   . Colon cancer Maternal Uncle   . Colon cancer Maternal Grandmother   . Breast cancer Neg Hx     SOCIAL HX:  [place] husband died Jan 02, 2019 due to brain hemorrhage Daughter in law Jakaylee Hauschildt    Current Outpatient Medications:  .  aspirin EC 81 MG tablet, Take 1 tablet (81 mg total) by mouth daily., Disp: , Rfl:  .  atorvastatin (LIPITOR) 20 MG tablet, Take 1 tablet (20 mg total) by mouth daily at 6 PM., Disp: 90 tablet, Rfl: 3 .  Calcium Carbonate-Vitamin D (CALCIUM 500 + D) 500-125 MG-UNIT TABS, Take 1 tablet by mouth daily., Disp: , Rfl:  .  cholecalciferol (VITAMIN D) 1000 units tablet, Take 1,000 Units by mouth daily., Disp: , Rfl:  .  Docusate Sodium (STOOL SOFTENER) 100 MG capsule, Take 100 mg by mouth daily., Disp: , Rfl:  .  ferrous sulfate (FEROSUL) 325 (65 FE) MG tablet, Take 1 tablet (325 mg total) by mouth 2 (two) times daily with a meal. (Patient taking differently:  Take 325 mg by mouth daily with breakfast. ), Disp: 180 tablet, Rfl: 3 .  Flaxseed, Linseed, (FLAX SEED OIL) 1300 MG CAPS, Take 1,100 mg by mouth daily. , Disp: , Rfl:  .  Multiple Vitamin (MULTIVITAMIN) tablet, Take 1 tablet by mouth daily., Disp: , Rfl:  .  Omega-3 Fatty Acids (FISH OIL) 1200 MG CAPS, Take 2,400 mg by mouth 2 (two) times daily., Disp: , Rfl:  .  omeprazole (PRILOSEC) 20 MG capsule, Take 1 capsule (20 mg total) by mouth daily., Disp: 90 capsule, Rfl: 3  EXAM:  VITALS per patient if applicable:  GENERAL: alert, oriented, appears well and in no acute distress  HEENT: atraumatic, conjunttiva clear, no  obvious abnormalities on inspection of external nose and ears  NECK: normal movements of the head and neck  LUNGS: on inspection no signs of respiratory distress, breathing rate appears normal, no obvious gross SOB, gasping or wheezing  CV: no obvious cyanosis  MS: moves all visible extremities without noticeable abnormality  PSYCH/NEURO: pleasant and cooperative, no obvious depression or anxiety, speech and thought processing grossly intact  ASSESSMENT AND PLAN:  Discussed the following assessment and plan:  Pure hypercholesterolemia - Plan: Comprehensive metabolic panel, Lipid panel Cont lipitor 20 mg qhs   Gastroesophageal reflux disease/HH - Plan: omeprazole (PRILOSEC) 20 MG capsule,   Iron deficiency anemia, unspecified iron deficiency anemia type - Plan: Comprehensive metabolic panel, CBC w/Diff, Iron, TIBC and Ferritin Panel  Acute cystitis without hematuria - Plan: Comprehensive metabolic panel, CBC w/Diff, Urinalysis, Routine w reflex microscopic, Urine Culture  HM Flu shot utd  Tdap consider in future  shingrix 2/2  covid 19 vx 01/23/19 1/2 moderna 2nd dose sch 2/12  Had 11/162016 prevnar [place] [facility]  pna 23 ? If had no record from [place] will need in future  Out of age window pap Mammogram had in 2016 last consider b/l dx mammo and [place] will disc with pt 09/2018 right breast red and sore since heart surgery 03/2018  12/02/18 negative mammo and [place]   Colonoscopy/EGD in 2014 [facility]oscopy normal EGD large H/H per pt  DEXA 2015/2016 [facility] normal per pt   Skin-left shoulder has rough scaly lesion appt dermatologist seen  -08/2018 [facility] [place] Dr. Nehemiah Massed  -we discussed possible serious and likely etiologies, options for evaluation and workup, limitations of telemedicine visit vs in person visit, treatment, treatment risks and precautions. Pt prefers to treat via telemedicine empirically rather then risking  or undertaking an in person visit at this moment. Patient agrees to seek prompt in person care if worsening, new symptoms arise, or if is not improving with treatment.   I discussed the assessment and treatment plan with the patient. The patient was provided an opportunity to ask questions and all were answered. The patient agreed with the plan and demonstrated an understanding of the instructions.   The patient was advised to call back or seek an in-person evaluation if the symptoms worsen or if the condition fails to improve as anticipated.  Time spent 15 minutes  Delorise Jackson, MD

## 2019-02-20 DIAGNOSIS — Z23 Encounter for immunization: Secondary | ICD-10-CM | POA: Diagnosis not present

## 2019-03-12 ENCOUNTER — Ambulatory Visit (INDEPENDENT_AMBULATORY_CARE_PROVIDER_SITE_OTHER): Payer: Medicare Other | Admitting: Cardiovascular Disease

## 2019-03-12 ENCOUNTER — Other Ambulatory Visit: Payer: Self-pay

## 2019-03-12 ENCOUNTER — Encounter: Payer: Self-pay | Admitting: Cardiovascular Disease

## 2019-03-12 VITALS — BP 132/66 | HR 63 | Ht 62.0 in | Wt 123.5 lb

## 2019-03-12 DIAGNOSIS — I7090 Unspecified atherosclerosis: Secondary | ICD-10-CM

## 2019-03-12 DIAGNOSIS — E785 Hyperlipidemia, unspecified: Secondary | ICD-10-CM

## 2019-03-12 DIAGNOSIS — D151 Benign neoplasm of heart: Secondary | ICD-10-CM | POA: Diagnosis not present

## 2019-03-12 NOTE — Progress Notes (Signed)
Cardiology Office Note   Date:  03/12/2019   ID:  Wanda Nelson, DOB 02/06/1931, MRN [id number]  PCP:  McLean-Scocuzza, Nino Glow, MD  Cardiologist:   [name], MD   Chief Complaint  Patient presents with  . office visit    pt has no complaints. Meds verbally reviewed w/ pt.      History of Present Illness: Wanda Nelson is a 86 y.o. female who presents for a follow-up visit post atrial myxoma resection. She has known history of anemia, hiatal hernia, hyperlipidemia and remote history of hemorrhagic stroke.  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  Cardiac catheterization before surgery showed no evidence of obstructive coronary artery disease.  She had intermittent palpitations and tachycardia shortly after surgery due to short runs of nonsustained ventricular tachycardia and intermittent A. fib with RVR. She did not require treatment for this and her symptoms resolved without intervention.  Most recent echocardiogram in December showed normal LV systolic function with no significant valvular abnormalities.  The intra-atrial septum appeared prominent but but I was not convinced that there was a recurrent mass in that area. She is doing very well with no recent chest pain, shortness of breath or palpitations.  She walks 3-1/2 miles daily for exercise.     Past Medical History:  Diagnosis Date  . Anemia   . Glaucoma   . History of blood transfusion   . History of hiatal hernia   . Hyperlipidemia   . Incidental pulmonary nodule, > 42mm and < 30mm 03/20/2018   Noted on CT scan - needs f/u imaging 6 months  . 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 Stroke [facility])     Past Surgical History:  Procedure Laterality Date  . ABDOMINAL HYSTERECTOMY  1982   total  . BUNIONECTOMY Bilateral   . CLIPPING OF ATRIAL APPENDAGE  03/21/2018   Procedure: Clipping Of Atrial Appendage - using an  AtriCure 49mm clip;  Surgeon: Rexene Alberts, MD;  [facility];  Service: Open Heart Surgery;;  . EXCISION OF ATRIAL MYXOMA N/A 03/21/2018   Procedure: Excision Of Atrial Myxoma;  Surgeon: Rexene Alberts, MD;  Location: [place];  Service: Open Heart Surgery;  Laterality: N/A;  . RECTOCELE REPAIR    . RIGHT/LEFT HEART CATH AND CORONARY ANGIOGRAPHY N/A 03/19/2018   Procedure: RIGHT/LEFT HEART CATH AND CORONARY ANGIOGRAPHY;  Surgeon: Wellington Hampshire, MD;  Location: [place] CV LAB;  Service: Cardiovascular;  Laterality: N/A;  . TEE WITHOUT CARDIOVERSION N/A 03/21/2018   Procedure: TRANSESOPHAGEAL ECHOCARDIOGRAM (TEE);  Surgeon: Rexene Alberts, MD;  Location: [place];  Service: Open Heart Surgery;  Laterality: N/A;  . TONSILLECTOMY       Current Outpatient Medications  Medication Sig Dispense Refill  . aspirin EC 81 MG tablet Take 1 tablet (81 mg total) by mouth daily.    Marland Kitchen atorvastatin (LIPITOR) 20 MG tablet Take 1 tablet (20 mg total) by mouth daily at 6 PM. 90 tablet 3  . Calcium Carbonate-Vitamin D (CALCIUM 500 + D) 500-125 MG-UNIT TABS Take 1 tablet by mouth daily.    . cholecalciferol (VITAMIN D) 1000 units tablet Take 1,000 Units by mouth daily.    Mariane Baumgarten Sodium (STOOL SOFTENER) 100 MG capsule Take 100 mg by mouth daily.    . ferrous sulfate (FEROSUL) 325 (65 FE) MG tablet Take 1 tablet (325 mg total) by mouth 2 (  two) times daily with a meal. (Patient taking differently: Take 325 mg by mouth daily with breakfast. ) 180 tablet 3  . Flaxseed, Linseed, (FLAX SEED OIL) 1300 MG CAPS Take 1,100 mg by mouth daily.     . Multiple Vitamin (MULTIVITAMIN) tablet Take 1 tablet by mouth daily.    . Omega-3 Fatty Acids (FISH OIL) 1200 MG CAPS Take 2,400 mg by mouth 2 (two) times daily.    Marland Kitchen omeprazole (PRILOSEC) 20 MG capsule Take 1 capsule (20 mg total) by mouth daily. 90 capsule 3   No current facility-administered medications for this visit.    Allergies:   Bee venom, Codeine,  Morphine and related, Shellfish allergy, and Nitrofurantoin    Social History:  The patient  reports that she has never smoked. She has never used smokeless tobacco. She reports that she does not drink alcohol or use drugs.   Family History:  The patient's family history includes Colon cancer in her brother, maternal grandmother, and maternal uncle.    ROS:  Please see the history of present illness.   Otherwise, review of systems are positive for none.   All other systems are reviewed and negative.    PHYSICAL EXAM: VS:  BP 132/66 (BP Location: Left Arm, Patient Position: Sitting, Cuff Size: Normal)   Pulse 63   Ht 5\' 2"  (1.575 m)   Wt 123 lb 8 oz (56 kg)   SpO2 97%   BMI 22.59 kg/m  , BMI Body mass index is 22.59 kg/m. GEN: Well nourished, well developed, in no acute distress  HEENT: normal  Neck: no JVD, carotid bruits, or masses Cardiac: RRR; no murmurs, rubs, or gallops,no edema  Respiratory:  clear to auscultation bilaterally, normal work of breathing GI: soft, nontender, nondistended, + BS MS: no deformity or atrophy  Skin: warm and dry, no rash Neuro:  Strength and sensation are intact Psych: euthymic mood, full affect   EKG:  EKG is ordered today. The ekg ordered today demonstrates sinus normal sinus rhythm with possible left atrial enlargement   Recent Labs: 03/27/2018: Magnesium 1.9 09/24/2018: ALT 19; BUN 17; Creatinine, Ser 0.82; Potassium 3.9; Sodium 139; TSH 1.87 10/29/2018: Hemoglobin 14.9; Platelets 151    Lipid Panel    Component Value Date/Time   CHOL 163 09/24/2018 0845   TRIG 66.0 09/24/2018 0845   HDL 60.50 09/24/2018 0845   CHOLHDL 3 09/24/2018 0845   VLDL 13.2 09/24/2018 0845   LDLCALC 89 09/24/2018 0845      Wt Readings from Last 3 Encounters:  03/12/19 123 lb 8 oz (56 kg)  01/29/19 115 lb (52.2 kg)  09/26/18 117 lb (53.1 kg)      No flowsheet data found.    ASSESSMENT AND PLAN:  1.  Status post surgical resection of atrial  myxoma: She is doing extremely well.  There was a concern about a possible recurrent mass on most recent echocardiogram in December.  However, I reviewed the study and I was not convinced.  I think that septum appeared prominent with some scarring in that area.  I am planning to repeat echocardiogram in June just to be on the safe side.  2.  Postoperative atrial fibrillation: She reports no further palpitations or tachycardia.  She did not require any medication for this.  3.  Hyperlipidemia: Currently on atorvastatin.    Disposition:   FU with me in 6 months  Signed,  [name], MD  03/12/2019 11:18 AM    [facility]

## 2019-03-12 NOTE — Patient Instructions (Signed)
Medication Instructions:  Your physician recommends that you continue on your current medications as directed. Please refer to the Current Medication list given to you today.  *If you need a refill on your cardiac medications before your next appointment, please call your pharmacy*   Lab Work: None ordered If you have labs (blood work) drawn today and your tests are completely normal, you will receive your results only by: Marland Kitchen MyChart Message (if you have MyChart) OR . A paper copy in the mail If you have any lab test that is abnormal or we need to change your treatment, we will call you to review the results.   Testing/Procedures: Your physician has requested that you have an echocardiogram. Echocardiography is a painless test that uses sound waves to create images of your heart. It provides your doctor with information about the size and shape of your heart and how well your heart's chambers and valves are working. This procedure takes approximately one hour. There are no restrictions for this procedure. (To be scheduled in June 2021)    Follow-Up: At [facility], you and your health needs are our priority.  As part of our continuing mission to provide you with exceptional heart care, we have created designated Provider Care Teams.  These Care Teams include your primary Cardiologist (physician) and Advanced Practice Providers (APPs -  Physician Assistants and Nurse Practitioners) who all work together to provide you with the care you need, when you need it.  We recommend signing up for the patient portal called "MyChart".  Sign up information is provided on this After Visit Summary.  MyChart is used to connect with patients for Virtual Visits (Telemedicine).  Patients are able to view lab/test results, encounter notes, upcoming appointments, etc.  Non-urgent messages can be sent to your provider as well.   To learn more about what you can do with MyChart, go to [email or web address].     Your next appointment:   6 month(s)  The format for your next appointment:   In Person  Provider:    You may see [name], MD or one of the following Advanced Practice Providers on your designated Care Team:    Murray Hodgkins, NP  Christell Faith, PA-C  Marrianne Mood, PA-C    Other Instructions  Echocardiogram An echocardiogram is a procedure that uses painless sound waves (ultrasound) to produce an image of the heart. Images from an echocardiogram can provide important information about:  Signs of coronary artery disease (CAD).  Aneurysm detection. An aneurysm is a weak or damaged part of an artery wall that bulges out from the normal force of blood pumping through the body.  Heart size and shape. Changes in the size or shape of the heart can be associated with certain conditions, including heart failure, aneurysm, and CAD.  Heart muscle function.  Heart valve function.  Signs of a past heart attack.  Fluid buildup around the heart.  Thickening of the heart muscle.  A tumor or infectious growth around the heart valves. Tell a health care provider about:  Any allergies you have.  All medicines you are taking, including vitamins, herbs, eye drops, creams, and over-the-counter medicines.  Any blood disorders you have.  Any surgeries you have had.  Any medical conditions you have.  Whether you are pregnant or may be pregnant. What are the risks? Generally, this is a safe procedure. However, problems may occur, including:  Allergic reaction to dye (contrast) that may be used during the procedure. What  happens before the procedure? No specific preparation is needed. You may eat and drink normally. What happens during the procedure?   An IV tube may be inserted into one of your veins.  You may receive contrast through this tube. A contrast is an injection that improves the quality of the pictures from your heart.  A gel will be applied to your  chest.  A wand-like tool (transducer) will be moved over your chest. The gel will help to transmit the sound waves from the transducer.  The sound waves will harmlessly bounce off of your heart to allow the heart images to be captured in real-time motion. The images will be recorded on a computer. The procedure may vary among health care providers and hospitals. What happens after the procedure?  You may return to your normal, everyday life, including diet, activities, and medicines, unless your health care provider tells you not to do that. Summary  An echocardiogram is a procedure that uses painless sound waves (ultrasound) to produce an image of the heart.  Images from an echocardiogram can provide important information about the size and shape of your heart, heart muscle function, heart valve function, and fluid buildup around your heart.  You do not need to do anything to prepare before this procedure. You may eat and drink normally.  After the echocardiogram is completed, you may return to your normal, everyday life, unless your health care provider tells you not to do that. This information is not intended to replace advice given to you by your health care provider. Make sure you discuss any questions you have with your health care provider. Document Revised: 04/17/2018 Document Reviewed: 01/28/2016 Elsevier Patient Education  [place].

## 2019-03-16 ENCOUNTER — Telehealth: Payer: Medicare Other | Admitting: Thoracic Surgery (Cardiothoracic Vascular Surgery)

## 2019-03-25 ENCOUNTER — Other Ambulatory Visit: Payer: Self-pay

## 2019-03-25 ENCOUNTER — Other Ambulatory Visit (INDEPENDENT_AMBULATORY_CARE_PROVIDER_SITE_OTHER): Payer: Medicare Other

## 2019-03-25 DIAGNOSIS — D509 Iron deficiency anemia, unspecified: Secondary | ICD-10-CM | POA: Diagnosis not present

## 2019-03-25 DIAGNOSIS — N3000 Acute cystitis without hematuria: Secondary | ICD-10-CM

## 2019-03-25 DIAGNOSIS — K219 Gastro-esophageal reflux disease without esophagitis: Secondary | ICD-10-CM

## 2019-03-25 DIAGNOSIS — E7800 Pure hypercholesterolemia, unspecified: Secondary | ICD-10-CM

## 2019-03-25 DIAGNOSIS — K449 Diaphragmatic hernia without obstruction or gangrene: Secondary | ICD-10-CM

## 2019-03-25 LAB — LIPID PANEL
Cholesterol: 160 mg/dL (ref 0–200)
HDL: 52 mg/dL (ref >39.00)
LDL Cholesterol: 89 mg/dL (ref 0–99)
NonHDL: 107.75
Total CHOL/HDL Ratio: 3
Triglycerides: 94 mg/dL (ref 0.0–149.0)
VLDL: 18.8 mg/dL (ref 0.0–40.0)

## 2019-03-25 LAB — COMPREHENSIVE METABOLIC PANEL
ALT: 18 U/L (ref 0–35)
AST: 22 U/L (ref 0–37)
Albumin: 3.9 g/dL (ref 3.5–5.2)
Alkaline Phosphatase: 87 U/L (ref 39–117)
BUN: 17 mg/dL (ref 6–23)
CO2: 28 mEq/L (ref 19–32)
Calcium: 9.3 mg/dL (ref 8.4–10.5)
Chloride: 104 mEq/L (ref 96–112)
Creatinine, Ser: 0.83 mg/dL (ref 0.40–1.20)
GFR: 65.18 mL/min (ref >60.00)
Glucose, Bld: 83 mg/dL (ref 70–99)
Potassium: 3.7 mEq/L (ref 3.5–5.1)
Sodium: 139 mEq/L (ref 135–145)
Total Bilirubin: 0.9 mg/dL (ref 0.2–1.2)
Total Protein: 7.5 g/dL (ref 6.0–8.3)

## 2019-03-25 LAB — CBC WITH DIFFERENTIAL/PLATELET
Basophils Absolute: 0.1 10*3/uL (ref 0.0–0.1)
Basophils Relative: 1.1 % (ref 0.0–3.0)
Eosinophils Absolute: 0.3 10*3/uL (ref 0.0–0.7)
Eosinophils Relative: 4.8 % (ref 0.0–5.0)
HCT: 45.4 % (ref 36.0–46.0)
Hemoglobin: 15.1 g/dL — ABNORMAL HIGH (ref 12.0–15.0)
Lymphocytes Relative: 21.4 % (ref 12.0–46.0)
Lymphs Abs: 1.4 10*3/uL (ref 0.7–4.0)
MCHC: 33.2 g/dL (ref 30.0–36.0)
MCV: 94.7 fl (ref 78.0–100.0)
Monocytes Absolute: 0.5 10*3/uL (ref 0.1–1.0)
Monocytes Relative: 7.6 % (ref 3.0–12.0)
Neutro Abs: 4.4 10*3/uL (ref 1.4–7.7)
Neutrophils Relative %: 65.1 % (ref 43.0–77.0)
Platelets: 136 10*3/uL — ABNORMAL LOW (ref 150.0–400.0)
RBC: 4.79 Mil/uL (ref 3.87–5.11)
RDW: 13.6 % (ref 11.5–15.5)
WBC: 6.8 10*3/uL (ref 4.0–10.5)

## 2019-03-25 NOTE — Addendum Note (Signed)
Addended by: Tor Netters I on: 03/25/2019 10:49 AM   Modules accepted: Orders

## 2019-03-26 LAB — IRON,TIBC AND FERRITIN PANEL
%SAT: 54 % (calc) — ABNORMAL HIGH (ref 16–45)
Ferritin: 132 ng/mL (ref 16–288)
Iron: 159 ug/dL (ref 45–160)
TIBC: 295 mcg/dL (calc) (ref 250–450)

## 2019-03-30 ENCOUNTER — Other Ambulatory Visit: Payer: Self-pay | Admitting: Thoracic Surgery (Cardiothoracic Vascular Surgery)

## 2019-03-30 ENCOUNTER — Other Ambulatory Visit: Payer: Self-pay

## 2019-03-30 ENCOUNTER — Encounter: Payer: Self-pay | Admitting: Thoracic Surgery (Cardiothoracic Vascular Surgery)

## 2019-03-30 ENCOUNTER — Ambulatory Visit
Admission: RE | Admit: 2019-03-30 | Discharge: 2019-03-30 | Disposition: A | Discharge status: Home / Self Care | Payer: Medicare Other | Source: Ambulatory Visit | Attending: Thoracic Surgery (Cardiothoracic Vascular Surgery) | Admitting: Thoracic Surgery (Cardiothoracic Vascular Surgery)

## 2019-03-30 ENCOUNTER — Ambulatory Visit (INDEPENDENT_AMBULATORY_CARE_PROVIDER_SITE_OTHER): Payer: Medicare Other | Admitting: Thoracic Surgery (Cardiothoracic Vascular Surgery)

## 2019-03-30 VITALS — BP 147/69 | HR 57 | Temp 97.7°F | Resp 20 | Ht 62.0 in | Wt 124.0 lb

## 2019-03-30 DIAGNOSIS — D151 Benign neoplasm of heart: Secondary | ICD-10-CM

## 2019-03-30 DIAGNOSIS — R911 Solitary pulmonary nodule: Secondary | ICD-10-CM

## 2019-03-30 DIAGNOSIS — R918 Other nonspecific abnormal finding of lung field: Secondary | ICD-10-CM | POA: Diagnosis not present

## 2019-03-30 NOTE — Progress Notes (Signed)
[address]       [place] [address]             [phone]     CARDIOTHORACIC SURGERY OFFICE NOTE  Referring Provider is Wellington Hampshire, MD PCP is McLean-Scocuzza, Nino Glow, MD   HPI:  Patient is an 86 year old female with history of anemia, hiatal hernia, hyperlipidemia, and remote history of stroke who underwent resection of benign left atrial myxoma on March 21, 2018.  Her early postoperative recovery was notable only for recurrent paroxysmal atrial fibrillation which resolved.  She was last seen here in our office on May 19, 2018 at which time she was doing well.  Since then she has been seen in follow-up on several occasions by Dr. Fletcher Anon, most recently on March 12, 2019.  She returns her office today and reports that she is doing exceptionally well.  She is very active physically for her age, walking 4 or 5 miles on a daily basis.  She reports feeling much improved in comparison with how she felt prior to surgery.  She only gets short of breath with very strenuous exertion and she is not limited in any way.  She no longer has any pain or tightness in her chest and she reports no other physical limitations.   Current Outpatient Medications  Medication Sig Dispense Refill  . aspirin EC 81 MG tablet Take 1 tablet (81 mg total) by mouth daily.    Marland Kitchen atorvastatin (LIPITOR) 20 MG tablet Take 1 tablet (20 mg total) by mouth daily at 6 PM. 90 tablet 3  . Calcium Carbonate-Vitamin D (CALCIUM 500 + D) 500-125 MG-UNIT TABS Take 1 tablet by mouth daily.    . cholecalciferol (VITAMIN D) 1000 units tablet Take 1,000 Units by mouth daily.    Mariane Baumgarten Sodium (STOOL SOFTENER) 100 MG capsule Take 100 mg by mouth daily.    . ferrous sulfate (FEROSUL) 325 (65 FE) MG tablet Take 1 tablet (325 mg total) by mouth 2 (two) times daily with a meal. (Patient taking differently: Take 325 mg by mouth daily with breakfast. ) 180 tablet 3  . Flaxseed, Linseed, (FLAX SEED OIL) 1300 MG  CAPS Take 1,100 mg by mouth daily.     . Multiple Vitamin (MULTIVITAMIN) tablet Take 1 tablet by mouth daily.    . Omega-3 Fatty Acids (FISH OIL) 1200 MG CAPS Take 2,400 mg by mouth 2 (two) times daily.    Marland Kitchen omeprazole (PRILOSEC) 20 MG capsule Take 1 capsule (20 mg total) by mouth daily. 90 capsule 3   No current facility-administered medications for this visit.      Physical Exam:   BP (!) 147/69   Pulse (!) 57   Temp 97.7 F (36.5 C) (Skin)   Resp 20   Ht 5\' 2"  (1.575 m)   Wt 124 lb (56.2 kg)   SpO2 94%   BMI 22.68 kg/m   General:  Well-appearing  Chest:   Clear to auscultation  CV:   Regular rate and rhythm without murmur  Incisions:  Completely healed  Abdomen:  Soft nontender  Extremities:  Warm and well-perfused  Diagnostic Tests:  ECHOCARDIOGRAM REPORT       Patient Name:  [name] Date of Exam: 12/18/2018  Medical Rec #: [id number]       Height:    62.0 in  Accession #:  [id number]      Weight:    117.0 lb  Date of  Birth: Aug 21, 1931       BSA:     1.52 m  Patient Age:  86 years       BP:      120/60 mmHg  Patient Gender: F           HR:      73 bpm.  Exam Location: [place]   Procedure: 2D Echo, Cardiac Doppler and Color Doppler   Indications:  R06.02 SOB    History:    Patient has prior history of Echocardiogram examinations,  most         recent 07/25/2018. Cardiomegaly, Prior Cardiac Surgery,  Stroke;         Risk Factors:Dyslipidemia and Non-Smoker. Patient  indicates she         has intermittent times of dyspnea. Patient states "The  same         symptoms I was having prior to my surgery".    Sonographer:  Salvadore Dom RVT, RDCS (AE), RDMS  Referring Phys: [place]    1. Left ventricular ejection fraction, by visual estimation, is 60 to  65%. The left ventricle has normal function. There  is no left ventricular  hypertrophy.  2. The left ventricle has no regional wall motion abnormalities.  3. Global right ventricle has normal systolic function.The right  ventricular size is normal. No increase in right ventricular wall  thickness.  4. TR signal is inadequate for assessing pulmonary artery systolic  pressure.  5. Left atrial size was normal. Small nonmobile opacity/thickening of the  interatrial septal extending in to teh left atrium noted measuring 0.5 cm  sq. Unable to exclude lipomatus hypertrophy of the septum.   In comparison to the previous echocardiogram(s): 03/21/2018 OR report:  There is a moderate sized mobile left atrial mass seen attached to the  fossa ovalis/atrial septum. The mass is pedunculated in shape. The left  atrial mass is suggestive of a myxoma.  Left Atrial Appendage: Has been surgically closed.  FINDINGS  Left Ventricle: Left ventricular ejection fraction, by visual estimation,  is 60 to 65%. The left ventricle has normal function. The left ventricle  has no regional wall motion abnormalities. There is no left ventricular  hypertrophy. Normal left atrial  pressure.   Right Ventricle: The right ventricular size is normal. No increase in  right ventricular wall thickness. Global RV systolic function is has  normal systolic function.   Left Atrium: Left atrial size was normal in size.   Right Atrium: Right atrial size was normal in size   Pericardium: There is no evidence of pericardial effusion.   Mitral Valve: The mitral valve is normal in structure. Mild mitral valve  regurgitation. No evidence of mitral valve stenosis by observation.   Tricuspid Valve: The tricuspid valve is normal in structure. Tricuspid  valve regurgitation moderate.   Aortic Valve: The aortic valve is normal in structure. Aortic valve  regurgitation is not visualized. The aortic valve is structurally normal,  with no evidence of sclerosis or stenosis.    Pulmonic Valve: The pulmonic valve was normal in structure. Pulmonic valve  regurgitation is trivial. Pulmonic regurgitation is trivial.   Aorta: The aortic root, ascending aorta and aortic arch are all  structurally normal, with no evidence of dilitation or obstruction.   Venous: The inferior vena cava is normal in size with greater than 50%  respiratory variability, suggesting right atrial pressure of 3 mmHg.   IAS/Shunts: No atrial level shunt detected  by color flow Doppler. There is  no evidence of a patent foramen ovale. No ventricular septal defect is  seen or detected. There is no evidence of an atrial septal defect.     LEFT VENTRICLE  PLAX 2D  LVIDd:     4.21 cm  LVIDs:     2.72 cm  LV PW:     0.84 cm  LV IVS:    0.75 cm  LVOT diam:   2.00 cm  LV SV:     52 ml  LV SV Index:  33.75  LVOT Area:   3.14 cm     LEFT ATRIUM       Index  LA diam:    3.50 cm 2.30 cm/m  LA Vol (A2C):      20.40 ml/m  LA Vol (A4C):      23.70 ml/m  LA Biplane Vol:     22.40 ml/m  AORTIC VALVE  LVOT Vmax:  84.20 cm/s  LVOT Vmean: 55.800 cm/s  LVOT VTI:  0.176 m    AORTA  Ao Root diam: 3.60 cm   MITRAL VALVE             TRICUSPID VALVE  MV Area (PHT): 5.02 cm       TV Peak grad:  21.7 mmHg  MV PHT:    43.79 msec      TV Vmax:    2.33 m/s  MV Decel Time: 151 msec  MV E velocity: 109.00 cm/s 103 cm/s SHUNTS  MV A velocity: 63.20 cm/s 70.3 cm/s Systemic VTI: 0.18 m  MV E/A ratio: 1.72    1.5    Systemic Diam: 2.00 cm     Ida Rogue MD  Electronically signed by Ida Rogue MD  Signature Date/Time: 12/19/2018/6:00:19 PM      Impression:  Patient is doing well approximately 1 year status post resection of benign left atrial myxoma.  I have personally reviewed the patient's most recent transthoracic echocardiogram performed December 19, 2018.  There is thickening in the  intra-atrial septum related to the patch closure.  There is no sign of intra-atrial communication.  Left ventricular function appears normal.    Plan:  We have not recommended any changes to the patient's current medications.  We will obtain follow-up noncontrast CT scan of the chest due to a very small benign-appearing nodule seen on CT scan performed last March.  Presuming that no further follow-up will be necessary the patient will call and return to our office in the future only should specific problems or questions arise.  I spent in excess of 15 minutes during the conduct of this office consultation and >50% of this time involved direct face-to-face encounter with the patient for counseling and/or coordination of their care.    Valentina Gu. Roxy Manns, MD 03/30/2019 11:34 AM

## 2019-03-30 NOTE — Patient Instructions (Signed)
Continue all previous medications without any changes at this time  

## 2019-04-14 ENCOUNTER — Other Ambulatory Visit: Payer: Self-pay

## 2019-04-14 ENCOUNTER — Ambulatory Visit (INDEPENDENT_AMBULATORY_CARE_PROVIDER_SITE_OTHER): Payer: Medicare Other

## 2019-04-14 VITALS — Ht 62.0 in | Wt 124.0 lb

## 2019-04-14 DIAGNOSIS — Z0000 Encounter for general adult medical examination without abnormal findings: Secondary | ICD-10-CM | POA: Diagnosis not present

## 2019-04-14 NOTE — Patient Instructions (Addendum)
  Ms. Wichert , Thank you for taking time to come for your Medicare Wellness Visit. I appreciate your ongoing commitment to your health goals. Please review the following plan we discussed and let me know if I can assist you in the future.   These are the goals we discussed: Goals      Patient Stated   . I want to maintain my weight (pt-stated)     Stay hydrated and drink plenty of water       This is a list of the screening recommended for you and due dates:  Health Maintenance  Topic Date Due  . Flu Shot  08/09/2019  . Tetanus Vaccine  01/09/2020  . DEXA scan (bone density measurement)  Addressed  . Pneumonia vaccines  Addressed

## 2019-04-14 NOTE — Progress Notes (Addendum)
Subjective:   Wanda Nelson is a 86 y.o. female who presents for Medicare Annual (Subsequent) preventive examination.  Review of Systems:  No ROS.  Medicare Wellness Virtual Visit.  Visual/audio telehealth visit, UTA vital signs. Ht/Wt provided.   See social history for additional risk factors.       Objective:     Vitals: Ht 5\' 2"  (1.575 m)   Wt 124 lb (56.2 kg)   BMI 22.68 kg/m   Body mass index is 22.68 kg/m.  Advanced Directives 04/14/2019 03/31/2018 03/21/2018 03/19/2018 03/19/2018 03/17/2018 05/26/2017  Does Patient Have a Medical Advance Directive? Yes Yes Yes Yes No No Yes  Type of Paramedic of [place];Living will [place];Living will [place];Living will [place];Living will - - [place];Living will  Does patient want to make changes to medical advance directive? No - Patient declined - No - Patient declined No - Patient declined - - -  Copy of [place] in Chart? Yes - validated most recent copy scanned in chart (See row information) No - copy requested - No - copy requested - - -  Would patient like information on creating a medical advance directive? - - No - Patient declined - No - Patient declined - -    Tobacco Social History   Tobacco Use  Smoking Status Never Smoker  Smokeless Tobacco Never Used     Counseling given: Not Answered   Clinical Intake:  Pre-visit preparation completed: Yes        Diabetes: No  How often do you need to have someone help you when you read instructions, pamphlets, or other written materials from your doctor or pharmacy?: 1 - Never  Interpreter Needed?: No     Past Medical History:  Diagnosis Date  . Anemia   . Glaucoma   . History of blood transfusion   . History of hiatal hernia   . Hyperlipidemia   . Incidental pulmonary nodule, > 21mm and < 25mm 03/20/2018   Noted on CT scan - needs  f/u imaging 6 months  . 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 Stroke [facility])    Past Surgical History:  Procedure Laterality Date  . ABDOMINAL HYSTERECTOMY  1982   total  . BUNIONECTOMY Bilateral   . CLIPPING OF ATRIAL APPENDAGE  03/21/2018   Procedure: Clipping Of Atrial Appendage - using an AtriCure 5mm clip;  Surgeon: Rexene Alberts, MD;  Location: [facility] OR;  Service: Open Heart Surgery;;  . EXCISION OF ATRIAL MYXOMA N/A 03/21/2018   Procedure: Excision Of Atrial Myxoma;  Surgeon: Rexene Alberts, MD;  Location: [facility];  Service: Open Heart Surgery;  Laterality: N/A;  . RECTOCELE REPAIR    . RIGHT/LEFT HEART CATH AND CORONARY ANGIOGRAPHY N/A 03/19/2018   Procedure: RIGHT/LEFT HEART CATH AND CORONARY ANGIOGRAPHY;  Surgeon: Wellington Hampshire, MD;  Location: [facility] CV LAB;  Service: Cardiovascular;  Laterality: N/A;  . TEE WITHOUT CARDIOVERSION N/A 03/21/2018   Procedure: TRANSESOPHAGEAL ECHOCARDIOGRAM (TEE);  Surgeon: Rexene Alberts, MD;  Location: [facility];  Service: Open Heart Surgery;  Laterality: N/A;  . TONSILLECTOMY     Family History  Problem Relation Age of Onset  . Colon cancer Brother   . Colon cancer Maternal Uncle   . Colon cancer Maternal Grandmother   . Breast cancer Neg Hx    Social History   Socioeconomic History  . Marital status: Widowed  Spouse name: Not on file  . Number of children: Not on file  . Years of education: Not on file  . Highest education level: Not on file  Occupational History  . Not on file  Tobacco Use  . Smoking status: Never Smoker  . Smokeless tobacco: Never Used  Substance and Sexual Activity  . Alcohol use: No    Alcohol/week: 0.0 standard drinks  . Drug use: No  . Sexual activity: Not Currently  Other Topics Concern  . Not on file  Social History Narrative   Lives [place],  husband died 23-Dec-2018 due to brain hemorrhage   Daughter in law Idania Ogaz    Social Determinants of Health   Financial Resource Strain:    . Difficulty of Paying Living Expenses:   Food Insecurity:   . Worried About Charity fundraiser in the Last Year:   . Arboriculturist in the Last Year:   Transportation Needs:   . Film/video editor (Medical):   Marland Kitchen Lack of Transportation (Non-Medical):   Physical Activity:   . Days of Exercise per Week:   . Minutes of Exercise per Session:   Stress:   . Feeling of Stress :   Social Connections:   . Frequency of Communication with Friends and Family:   . Frequency of Social Gatherings with Friends and Family:   . Attends Religious Services:   . Active Member of Clubs or Organizations:   . Attends Archivist Meetings:   Marland Kitchen Marital Status:     Outpatient Encounter Medications as of 04/14/2019  Medication Sig  . aspirin EC 81 MG tablet Take 1 tablet (81 mg total) by mouth daily.  Marland Kitchen atorvastatin (LIPITOR) 20 MG tablet Take 1 tablet (20 mg total) by mouth daily at 6 PM.  . Calcium Carbonate-Vitamin D (CALCIUM 500 + D) 500-125 MG-UNIT TABS Take 1 tablet by mouth daily.  . cholecalciferol (VITAMIN D) 1000 units tablet Take 1,000 Units by mouth daily.  Mariane Baumgarten Sodium (STOOL SOFTENER) 100 MG capsule Take 100 mg by mouth daily.  . ferrous sulfate (FEROSUL) 325 (65 FE) MG tablet Take 1 tablet (325 mg total) by mouth 2 (two) times daily with a meal. (Patient taking differently: Take 325 mg by mouth at bedtime. )  . Flaxseed, Linseed, (FLAX SEED OIL) 1300 MG CAPS Take 1,100 mg by mouth daily.   . Multiple Vitamin (MULTIVITAMIN) tablet Take 1 tablet by mouth daily.  . Omega-3 Fatty Acids (FISH OIL) 1200 MG CAPS Take 2,400 mg by mouth 2 (two) times daily.  Marland Kitchen omeprazole (PRILOSEC) 20 MG capsule Take 1 capsule (20 mg total) by mouth daily.   No facility-administered encounter medications on file as of 04/14/2019.    Activities of Daily Living In your present state of health, do you have any difficulty performing the following activities: 04/14/2019  Hearing? Y  Comment Left ear  hearing aid  Vision? N  Difficulty concentrating or making decisions? N  Walking or climbing stairs? N  Dressing or bathing? N  Doing errands, shopping? N  Preparing Food and eating ? N  Using the Toilet? N  In the past six months, have you accidently leaked urine? N  Do you have problems with loss of bowel control? N  Managing your Medications? N  Managing your Finances? N  Housekeeping or managing your Housekeeping? N  Comment Maid assist 1x monthly  Some recent data might be hidden    Patient Care Team: McLean-Scocuzza,  Nino Glow, MD as PCP - General (Internal Medicine) Wellington Hampshire, MD as PCP - Cardiology (Cardiology)    Assessment:   This is a routine wellness examination for [place].  Nurse connected with patient 04/14/19 at  9:30 AM EDT by a telephone enabled telemedicine application and verified that I am speaking with the correct person using two identifiers. Patient stated full name and DOB. Patient gave permission to continue with virtual visit. Patient's location was at home and Nurse's location was at [place] office.   Patient is alert and oriented x3. Patient denies difficulty focusing or concentrating. Patient likes to read, completes her own and families taxes for brain health.  Health Maintenance Due: See completed HM at the end of note.   Eye: Visual acuity not assessed. Virtual visit. Followed by their ophthalmologist.  Hx of Glaucoma; visits every 3 months.  Dental: Visits every 6 months.    Hearing: Hearing aids- L ear only  Safety:  Patient feels safe at home- yes Patient does have smoke detectors at home- yes Patient does wear sunscreen or protective clothing when in direct sunlight - yes Patient does wear seat belt when in a moving vehicle - yes Patient drives- yes Adequate lighting in walkways free from debris- yes Grab bars and handrails used as appropriate- yes Ambulates with an assistive device- no Cell phone on person when  ambulating outside of the home- yes  Social: Alcohol intake - no     Smoking history- never   Smokers in home? none Illicit drug use? none  Medication: Taking as directed and without issues.  Pill box in use -yes  Self managed - yes   Covid-19: Precautions and sickness symptoms discussed. Wears mask, social distancing, hand hygiene as appropriate.   Activities of Daily Living Patient denies needing assistance with: household chores, feeding themselves, getting from bed to chair, getting to the toilet, bathing/showering, dressing, managing money, or preparing meals.  Household chores   Discussed the importance of a healthy diet, water intake and the benefits of aerobic exercise.   Physical activity- balance exercises twice weekly, walks 3 miles daily, 65 minutes  Diet:  Regular Water: fair Caffeine: none  Other Providers Patient Care Team: McLean-Scocuzza, Nino Glow, MD as PCP - General (Internal Medicine) Wellington Hampshire, MD as PCP - Cardiology (Cardiology)  Exercise Activities and Dietary recommendations Current Exercise Habits: Home exercise routine, Type of exercise: walking(Balance exercises), Time (Minutes): > 60, Frequency (Times/Week): 7, Weekly Exercise (Minutes/Week): 0, Intensity: Moderate  Goals      Patient Stated   . I want to maintain my weight (pt-stated)     Stay hydrated and drink plenty of water       Fall Risk Fall Risk  04/14/2019 09/26/2018 12/11/2017 11/27/2017 09/25/2016  Falls in the past year? 0 0 0 0 No  Comment - - - Emmi Telephone Survey: data to providers prior to load -  Number falls in past yr: - - - - -  Injury with Fall? - - - - -  Follow up Falls evaluation completed;Falls prevention discussed Falls evaluation completed - - -   Timed Get Up and Go performed: no, virtual visit  Depression Screen PHQ 2/9 Scores 04/14/2019 09/26/2018 12/11/2017 09/25/2016  PHQ - 2 Score 0 0 0 0  PHQ- 9 Score - - 0 -     Cognitive Function     6CIT  Screen 04/14/2019  What Year? 0 points  What month? 0 points  What time? 0  points  Count back from 20 0 points  Months in reverse 0 points  Repeat phrase 0 points  Total Score 0    Immunization History  Administered Date(s) Administered  . Influenza,inj,Quad PF,6+ Mos 09/22/2015  . Influenza-Unspecified 10/08/2017, 10/27/2018  . Moderna SARS-COVID-2 Vaccination 01/23/2019, 02/20/2019  . Pneumococcal Conjugate-13 03/24/2014  . Zoster Recombinat (Shingrix) 09/30/2017, 12/23/2017   Screening Tests Health Maintenance  Topic Date Due  . INFLUENZA VACCINE  08/09/2019  . TETANUS/TDAP  01/09/2020  . DEXA SCAN  Addressed  . PNA vac Low Risk Adult  Addressed       Plan:   Keep all routine maintenance appointments.   Follow up 05/05/19  Recent lab results read to patient per her request.  Declines work-up with specialist post labs.   Medicare Attestation I have personally reviewed: The patient's medical and social history Their use of alcohol, tobacco or illicit drugs Their current medications and supplements The patient's functional ability including ADLs,fall risks, home safety risks, cognitive, and hearing and visual impairment Diet and physical activities Evidence for depression   I have reviewed and discussed with patient certain preventive protocols, quality metrics, and best practice recommendations.     Varney Biles, LPN  [id number]   Agree TMS

## 2019-05-05 ENCOUNTER — Other Ambulatory Visit: Payer: Self-pay

## 2019-05-05 ENCOUNTER — Ambulatory Visit (INDEPENDENT_AMBULATORY_CARE_PROVIDER_SITE_OTHER): Payer: Medicare Other | Admitting: Internal Medicine

## 2019-05-05 ENCOUNTER — Encounter: Payer: Self-pay | Admitting: Internal Medicine

## 2019-05-05 VITALS — BP 112/56 | HR 58 | Temp 98.3°F | Ht 62.0 in | Wt 120.2 lb

## 2019-05-05 DIAGNOSIS — E785 Hyperlipidemia, unspecified: Secondary | ICD-10-CM | POA: Diagnosis not present

## 2019-05-05 DIAGNOSIS — Z1329 Encounter for screening for other suspected endocrine disorder: Secondary | ICD-10-CM

## 2019-05-05 DIAGNOSIS — Z1389 Encounter for screening for other disorder: Secondary | ICD-10-CM | POA: Diagnosis not present

## 2019-05-05 DIAGNOSIS — D751 Secondary polycythemia: Secondary | ICD-10-CM

## 2019-05-05 DIAGNOSIS — Z0000 Encounter for general adult medical examination without abnormal findings: Secondary | ICD-10-CM

## 2019-05-05 DIAGNOSIS — H6122 Impacted cerumen, left ear: Secondary | ICD-10-CM | POA: Diagnosis not present

## 2019-05-05 DIAGNOSIS — D696 Thrombocytopenia, unspecified: Secondary | ICD-10-CM

## 2019-05-05 DIAGNOSIS — K449 Diaphragmatic hernia without obstruction or gangrene: Secondary | ICD-10-CM | POA: Diagnosis not present

## 2019-05-05 NOTE — Patient Instructions (Addendum)
55 ounces water  HINT water   Seborrheic Keratosis A seborrheic keratosis is a common, noncancerous (benign) skin growth. These growths are velvety, waxy, rough, tan, brown, or black spots that appear on the skin. These skin growths can be flat or raised, and scaly. What are the causes? The cause of this condition is not known. What increases the risk? You are more likely to develop this condition if you:  Have a family history of seborrheic keratosis.  Are 50 or older.  Are pregnant.  Have had estrogen replacement therapy. What are the signs or symptoms? Symptoms of this condition include growths on the face, chest, shoulders, back, or other areas. These growths:  Are usually painless, but may become irritated and itchy.  Can be yellow, brown, black, or other colors.  Are slightly raised or have a flat surface.  Are sometimes rough or wart-like in texture.  Are often velvety or waxy on the surface.  Are round or oval-shaped.  Often occur in groups, but may occur as a single growth. How is this diagnosed? This condition is diagnosed with a medical history and physical exam.  A sample of the growth may be tested (skin biopsy).  You may need to see a skin specialist (dermatologist). How is this treated? Treatment is not usually needed for this condition, unless the growths are irritated or bleed often.  You may also choose to have the growths removed if you do not like their appearance. ? Most commonly, these growths are treated with a procedure in which liquid nitrogen is applied to "freeze" off the growth (cryosurgery). ? They may also be burned off with electricity (electrocautery) or removed by scraping (curettage). Follow these instructions at home:  Watch your growth for any changes.  Keep all follow-up visits as told by your health care provider. This is important.  Do not scratch or pick at the growth or growths. This can cause them to become irritated or  infected. Contact a health care provider if:  You suddenly have many new growths.  Your growth bleeds, itches, or hurts.  Your growth suddenly becomes larger or changes color. Summary  A seborrheic keratosis is a common, noncancerous (benign) skin growth.  Treatment is not usually needed for this condition, unless the growths are irritated or bleed often.  Watch your growth for any changes.  Contact a health care provider if you suddenly have many new growths or your growth suddenly becomes larger or changes color.  Keep all follow-up visits as told by your health care provider. This is important. This information is not intended to replace advice given to you by your health care provider. Make sure you discuss any questions you have with your health care provider. Document Revised: 05/09/2017 Document Reviewed: 05/09/2017 Elsevier Patient Education  [place].  Hiatal Hernia  A hiatal hernia occurs when part of the stomach slides above the muscle that separates the abdomen from the chest (diaphragm). A person can be born with a hiatal hernia (congenital), or it may develop over time. In almost all cases of hiatal hernia, only the top part of the stomach pushes through the diaphragm. Many people have a hiatal hernia with no symptoms. The larger the hernia, the more likely it is that you will have symptoms. In some cases, a hiatal hernia allows stomach acid to flow back into the tube that carries food from your mouth to your stomach (esophagus). This may cause heartburn symptoms. Severe heartburn symptoms may mean that you  have developed a condition called gastroesophageal reflux disease (GERD). What are the causes? This condition is caused by a weakness in the opening (hiatus) where the esophagus passes through the diaphragm to attach to the upper part of the stomach. A person may be born with a weakness in the hiatus, or a weakness can develop over time. What increases the  risk? This condition is more likely to develop in:  Older people. Age is a major risk factor for a hiatal hernia, especially if you are over the age of 50.  Pregnant women.  People who are overweight.  People who have frequent constipation. What are the signs or symptoms? Symptoms of this condition usually develop in the form of GERD symptoms. Symptoms include:  Heartburn.  Belching.  Indigestion.  Trouble swallowing.  Coughing or wheezing.  Sore throat.  Hoarseness.  Chest pain.  Nausea and vomiting. How is this diagnosed? This condition may be diagnosed during testing for GERD. Tests that may be done include:  X-rays of your stomach or chest.  An upper gastrointestinal (GI) series. This is an X-ray exam of your GI tract that is taken after you swallow a chalky liquid that shows up clearly on the X-ray.  Endoscopy. This is a procedure to look into your stomach using a thin, flexible tube that has a tiny camera and light on the end of it. How is this treated? This condition may be treated by:  Dietary and lifestyle changes to help reduce GERD symptoms.  Medicines. These may include: ? Over-the-counter antacids. ? Medicines that make your stomach empty more quickly. ? Medicines that block the production of stomach acid (H2 blockers). ? Stronger medicines to reduce stomach acid (proton pump inhibitors).  Surgery to repair the hernia, if other treatments are not helping. If you have no symptoms, you may not need treatment. Follow these instructions at home: Lifestyle and activity  Do not use any products that contain nicotine or tobacco, such as cigarettes and e-cigarettes. If you need help quitting, ask your health care provider.  Try to achieve and maintain a healthy body weight.  Avoid putting pressure on your abdomen. Anything that puts pressure on your abdomen increases the amount of acid that may be pushed up into your esophagus. ? Avoid bending over,  especially after eating. ? Raise the head of your bed by putting blocks under the legs. This keeps your head and esophagus higher than your stomach. ? Do not wear tight clothing around your chest or stomach. ? Try not to strain when having a bowel movement, when urinating, or when lifting heavy objects. Eating and drinking  Avoid foods that can worsen GERD symptoms. These may include: ? Fatty foods, like fried foods. ? Citrus fruits, like oranges or lemon. ? Other foods and drinks that contain acid, like orange juice or tomatoes. ? Spicy food. ? Chocolate.  Eat frequent small meals instead of three large meals a day. This helps prevent your stomach from getting too full. ? Eat slowly. ? Do not lie down right after eating. ? Do not eat 1-2 hours before bed.  Do not drink beverages with caffeine. These include cola, coffee, cocoa, and tea.  Do not drink alcohol. General instructions  Take over-the-counter and prescription medicines only as told by your health care provider.  Keep all follow-up visits as told by your health care provider. This is important. Contact a health care provider if:  Your symptoms are not controlled with medicines or lifestyle changes.  You are having trouble swallowing.  You have coughing or wheezing that will not go away. Get help right away if:  Your pain is getting worse.  Your pain spreads to your arms, neck, jaw, teeth, or back.  You have shortness of breath.  You sweat for no reason.  You feel sick to your stomach (nauseous) or you vomit.  You vomit blood.  You have bright red blood in your stools.  You have black, tarry stools. This information is not intended to replace advice given to you by your health care provider. Make sure you discuss any questions you have with your health care provider. Document Revised: 12/07/2016 Document Reviewed: 07/30/2016 Elsevier Patient Education  [employer].

## 2019-05-05 NOTE — Progress Notes (Signed)
Chief Complaint  Patient presents with  . Follow-up   F/u  1.large hiatal hernia not particularly bothersome to pt except at times upper chest/mid ab pressure/protrusion per pt she wants to know if should do surgery  2. Ear wax b/l ears refer to ENT she reports h/o chronic ear infections and bleeding in ear at 86 y.o was going to have mastoid surgery but never did  3. Polycythemia and low plts will repeat labs [facility] 10/2019  Exercise able to walk 3 miles daily with friends at [facility]  Review of Systems  Constitutional: Positive for weight loss.  HENT:       +ear wax   Eyes: Negative for blurred vision.  Respiratory: Negative for shortness of breath.   Cardiovascular: Negative for chest pain.  Gastrointestinal: Negative for heartburn.  Musculoskeletal: Negative for falls.  Skin: Positive for itching.       LUB  Psychiatric/Behavioral: Negative for memory loss.   Past Medical History:  Diagnosis Date  . Anemia   . Glaucoma   . History of blood transfusion   . History of hiatal hernia   . Hyperlipidemia   . Incidental pulmonary nodule, > 9mm and < 48mm 03/20/2018   Noted on CT scan - needs f/u imaging 6 months  . 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 Stroke [facility])    Past Surgical History:  Procedure Laterality Date  . ABDOMINAL HYSTERECTOMY  1982   total  . BUNIONECTOMY Bilateral   . CLIPPING OF ATRIAL APPENDAGE  03/21/2018   Procedure: Clipping Of Atrial Appendage - using an AtriCure 53mm clip;  Surgeon: Rexene Alberts, MD;  Location[facility] OR;  Service: Open Heart Surgery;;  . EXCISION OF ATRIAL MYXOMA N/A 03/21/2018   Procedure: Excision Of Atrial Myxoma;  Surgeon: Rexene Alberts, MD;  Location: [facility];  Service: Open Heart Surgery;  Laterality: N/A;  . RECTOCELE REPAIR    . RIGHT/LEFT HEART CATH AND CORONARY ANGIOGRAPHY N/A 03/19/2018   Procedure: RIGHT/LEFT HEART CATH AND CORONARY ANGIOGRAPHY;  Surgeon: Wellington Hampshire, MD;  Location: [place] CV LAB;   Service: Cardiovascular;  Laterality: N/A;  . TEE WITHOUT CARDIOVERSION N/A 03/21/2018   Procedure: TRANSESOPHAGEAL ECHOCARDIOGRAM (TEE);  Surgeon: Rexene Alberts, MD;  Location: [place];  Service: Open Heart Surgery;  Laterality: N/A;  . TONSILLECTOMY     Family History  Problem Relation Age of Onset  . Colon cancer Brother   . Colon cancer Maternal Uncle   . Colon cancer Maternal Grandmother   . Breast cancer Neg Hx    Social History   Socioeconomic History  . Marital status: Widowed    Spouse name: Not on file  . Number of children: Not on file  . Years of education: Not on file  . Highest education level: Not on file  Occupational History  . Not on file  Tobacco Use  . Smoking status: Never Smoker  . Smokeless tobacco: Never Used  Substance and Sexual Activity  . Alcohol use: No    Alcohol/week: 0.0 standard drinks  . Drug use: No  . Sexual activity: Not Currently  Other Topics Concern  . Not on file  Social History Narrative   Lives [facility],  husband died 12/28/18 due to brain hemorrhage   Daughter in law Adesuwa Sturm    widowed   Social Determinants of Health   Financial Resource Strain:   . Difficulty of Paying Living Expenses:   Food Insecurity:   .  Worried About Charity fundraiser in the Last Year:   . Arboriculturist in the Last Year:   Transportation Needs:   . Film/video editor (Medical):   Marland Kitchen Lack of Transportation (Non-Medical):   Physical Activity:   . Days of Exercise per Week:   . Minutes of Exercise per Session:   Stress:   . Feeling of Stress :   Social Connections:   . Frequency of Communication with Friends and Family:   . Frequency of Social Gatherings with Friends and Family:   . Attends Religious Services:   . Active Member of Clubs or Organizations:   . Attends Archivist Meetings:   Marland Kitchen Marital Status:   Intimate Partner Violence:   . Fear of Current or Ex-Partner:   . Emotionally Abused:   Marland Kitchen Physically Abused:   .  Sexually Abused:    Current Meds  Medication Sig  . aspirin EC 81 MG tablet Take 1 tablet (81 mg total) by mouth daily.  Marland Kitchen atorvastatin (LIPITOR) 20 MG tablet Take 1 tablet (20 mg total) by mouth daily at 6 PM.  . Calcium Carbonate-Vitamin D (CALCIUM 500 + D) 500-125 MG-UNIT TABS Take 1 tablet by mouth daily.  . cholecalciferol (VITAMIN D) 1000 units tablet Take 1,000 Units by mouth daily.  Mariane Baumgarten Sodium (STOOL SOFTENER) 100 MG capsule Take 100 mg by mouth daily.  . ferrous sulfate (FEROSUL) 325 (65 FE) MG tablet Take 1 tablet (325 mg total) by mouth 2 (two) times daily with a meal. (Patient taking differently: Take 325 mg by mouth at bedtime. )  . Flaxseed, Linseed, (FLAX SEED OIL) 1300 MG CAPS Take 1,100 mg by mouth daily.   . Multiple Vitamin (MULTIVITAMIN) tablet Take 1 tablet by mouth daily.  . Omega-3 Fatty Acids (FISH OIL) 1200 MG CAPS Take 2,400 mg by mouth 2 (two) times daily.  Marland Kitchen omeprazole (PRILOSEC) 20 MG capsule Take 1 capsule (20 mg total) by mouth daily.   Allergies  Allergen Reactions  . Bee Venom Anaphylaxis and Hives  . Codeine Other (See Comments)    Altered mental status Altered mental status  . Morphine And Related     Altered mental status  . Shellfish Allergy Anaphylaxis and Hives    Mainly shrimp  . Nitrofurantoin Rash   Recent Results (from the past 2160 hour(s))  Iron, TIBC and Ferritin Panel     Status: Abnormal   Collection Time: 03/25/19  8:48 AM  Result Value Ref Range   Iron 159 45 - 160 mcg/dL   TIBC 295 250 - 450 mcg/dL (calc)   %SAT 54 (H) 16 - 45 % (calc)   Ferritin 132 16 - 288 ng/mL  CBC w/Diff     Status: Abnormal   Collection Time: 03/25/19  8:48 AM  Result Value Ref Range   WBC 6.8 4.0 - 10.5 K/uL   RBC 4.79 3.87 - 5.11 Mil/uL   Hemoglobin 15.1 (H) 12.0 - 15.0 g/dL   HCT 45.4 36.0 - 46.0 %   MCV 94.7 78.0 - 100.0 fl   MCHC 33.2 30.0 - 36.0 g/dL   RDW 13.6 11.5 - 15.5 %   Platelets 136.0 (L) 150.0 - 400.0 K/uL   Neutrophils  Relative % 65.1 43.0 - 77.0 %   Lymphocytes Relative 21.4 12.0 - 46.0 %   Monocytes Relative 7.6 3.0 - 12.0 %   Eosinophils Relative 4.8 0.0 - 5.0 %   Basophils Relative 1.1 0.0 - 3.0 %  Neutro Abs 4.4 1.4 - 7.7 K/uL   Lymphs Abs 1.4 0.7 - 4.0 K/uL   Monocytes Absolute 0.5 0.1 - 1.0 K/uL   Eosinophils Absolute 0.3 0.0 - 0.7 K/uL   Basophils Absolute 0.1 0.0 - 0.1 K/uL  Lipid panel     Status: None   Collection Time: 03/25/19  8:48 AM  Result Value Ref Range   Cholesterol 160 0 - 200 mg/dL    Comment: ATP III Classification       Desirable:  < 200 mg/dL               Borderline High:  200 - 239 mg/dL          High:  > = 240 mg/dL   Triglycerides 94.0 0.0 - 149.0 mg/dL    Comment: Normal:  <150 mg/dLBorderline High:  150 - 199 mg/dL   HDL 52.00 >39.00 mg/dL   VLDL 18.8 0.0 - 40.0 mg/dL   LDL Cholesterol 89 0 - 99 mg/dL   Total CHOL/HDL Ratio 3     Comment:                Men          Women1/2 Average Risk     3.4          3.3Average Risk          5.0          4.42X Average Risk          9.6          7.13X Average Risk          15.0          11.0                       NonHDL 107.75     Comment: NOTE:  Non-HDL goal should be 30 mg/dL higher than patient's LDL goal (i.e. LDL goal of < 70 mg/dL, would have non-HDL goal of < 100 mg/dL)  Comprehensive metabolic panel     Status: None   Collection Time: 03/25/19  8:48 AM  Result Value Ref Range   Sodium 139 135 - 145 mEq/L   Potassium 3.7 3.5 - 5.1 mEq/L   Chloride 104 96 - 112 mEq/L   CO2 28 19 - 32 mEq/L   Glucose, Bld 83 70 - 99 mg/dL   BUN 17 6 - 23 mg/dL   Creatinine, Ser 0.83 0.40 - 1.20 mg/dL   Total Bilirubin 0.9 0.2 - 1.2 mg/dL   Alkaline Phosphatase 87 39 - 117 U/L   AST 22 0 - 37 U/L   ALT 18 0 - 35 U/L   Total Protein 7.5 6.0 - 8.3 g/dL   Albumin 3.9 3.5 - 5.2 g/dL   GFR 65.18 >60.00 mL/min   Calcium 9.3 8.4 - 10.5 mg/dL   Objective  Body mass index is 21.98 kg/m. Wt Readings from Last 3 Encounters:  05/05/19 120 lb  3.2 oz (54.5 kg)  04/14/19 124 lb (56.2 kg)  03/30/19 124 lb (56.2 kg)   Temp Readings from Last 3 Encounters:  05/05/19 98.3 F (36.8 C) (Skin)  03/30/19 97.7 F (36.5 C) (Skin)  05/19/18 (!) 97.5 F (36.4 C) (Skin)   BP Readings from Last 3 Encounters:  05/05/19 (!) 112/56  03/30/19 (!) 147/69  03/12/19 132/66   Pulse Readings from Last 3 Encounters:  05/05/19 (!) 58  03/30/19 (!) 57  03/12/19 63  Physical Exam Vitals and nursing note reviewed.  Constitutional:      Appearance: Normal appearance. She is well-developed and well-groomed.  HENT:     Head: Normocephalic and atraumatic.     Ears:     Comments: Ear wax left ear  Eyes:     Conjunctiva/sclera: Conjunctivae normal.     Pupils: Pupils are equal, round, and reactive to light.  Cardiovascular:     Rate and Rhythm: Normal rate and regular rhythm.     Heart sounds: Normal heart sounds.  Pulmonary:     Effort: Pulmonary effort is normal.     Breath sounds: Normal breath sounds.  Skin:    General: Skin is warm and dry.  Neurological:     General: No focal deficit present.     Mental Status: She is alert and oriented to person, place, and time. Mental status is at baseline.     Gait: Gait normal.  Psychiatric:        Attention and Perception: Attention and perception normal.        Mood and Affect: Mood and affect normal.        Speech: Speech normal.        Behavior: Behavior normal. Behavior is cooperative.        Thought Content: Thought content normal.        Cognition and Memory: Cognition and memory normal.        Judgment: Judgment normal.     Assessment  Plan  Thrombocytopenia (HCC)/polycythemia - Plan: CBC with Differential/Platelet Repeat quest labs [facility] 10/2019 given forms today   Excessive ear wax, left - Plan: Ambulatory referral to ENT  Hyperlipidemia, unspecified hyperlipidemia type - Plan: Comprehensive metabolic panel, Lipid panel Cont lipitor 20 mg qhs  Hiatal hernia,  large Will not refer to gen surgery at this time not bothersome or alarm sx's  HM Flu shot utd  Tdap consider in future  shingrix 2/2 covid 19 vx 01/23/19 1/2 moderna 2nd dose sch 2/12  [id number] prevnar [facility] pna 23? If hadno record from [place] will need in future  Out of age window pap Mammogram had in 2016 last consider b/l dx mammo and [place] will disc with pt 09/2018 right breast red and sore since heart surgery 03/2018  12/02/18 negative mammo and [place]  Colonoscopy/EGD in 2014 [facility] colonoscopy normal EGD large H/H per pt  DEXA 2015/2016 [facility] normal per pt  Skin-left shoulder has rough scaly lesion appt dermatologistseen  -08/2018 [facility] [place] Dr. Nehemiah Massed appt sch 11/2019 per pt   Provider: Dr. Olivia Mackie McLean-Scocuzza-Internal Medicine

## 2019-05-05 NOTE — Progress Notes (Signed)
Pre visit review using our clinic review tool, if applicable. No additional management support is needed unless otherwise documented below in the visit note. 

## 2019-05-11 DIAGNOSIS — H6122 Impacted cerumen, left ear: Secondary | ICD-10-CM | POA: Diagnosis not present

## 2019-05-11 DIAGNOSIS — H90A22 Sensorineural hearing loss, unilateral, left ear, with restricted hearing on the contralateral side: Secondary | ICD-10-CM | POA: Diagnosis not present

## 2019-05-11 DIAGNOSIS — H903 Sensorineural hearing loss, bilateral: Secondary | ICD-10-CM | POA: Diagnosis not present

## 2019-05-15 DIAGNOSIS — Z961 Presence of intraocular lens: Secondary | ICD-10-CM | POA: Diagnosis not present

## 2019-05-15 DIAGNOSIS — H401123 Primary open-angle glaucoma, left eye, severe stage: Secondary | ICD-10-CM | POA: Diagnosis not present

## 2019-05-15 DIAGNOSIS — H02886 Meibomian gland dysfunction of left eye, unspecified eyelid: Secondary | ICD-10-CM | POA: Diagnosis not present

## 2019-05-15 DIAGNOSIS — H401112 Primary open-angle glaucoma, right eye, moderate stage: Secondary | ICD-10-CM | POA: Diagnosis not present

## 2019-05-15 DIAGNOSIS — H02883 Meibomian gland dysfunction of right eye, unspecified eyelid: Secondary | ICD-10-CM | POA: Diagnosis not present

## 2019-05-25 ENCOUNTER — Encounter: Payer: Self-pay | Admitting: Nurse Practitioner

## 2019-05-25 ENCOUNTER — Other Ambulatory Visit: Payer: Self-pay

## 2019-05-25 ENCOUNTER — Ambulatory Visit (INDEPENDENT_AMBULATORY_CARE_PROVIDER_SITE_OTHER): Payer: Medicare Other | Admitting: Nurse Practitioner

## 2019-05-25 VITALS — BP 120/64 | HR 77 | Temp 98.2°F | Ht 62.0 in | Wt 121.6 lb

## 2019-05-25 DIAGNOSIS — R300 Dysuria: Secondary | ICD-10-CM

## 2019-05-25 LAB — POCT URINALYSIS DIPSTICK
Bilirubin, UA: NEGATIVE
Glucose, UA: NEGATIVE
Ketones, UA: NEGATIVE
Nitrite, UA: NEGATIVE
Protein, UA: POSITIVE — AB
Spec Grav, UA: 1.015 (ref 1.010–1.025)
Urobilinogen, UA: NEGATIVE E.U./dL — AB
pH, UA: 6 (ref 5.0–8.0)

## 2019-05-25 MED ORDER — CEFDINIR 300 MG PO CAPS: 300.0000 mg | ORAL_CAPSULE | Freq: Two times a day (BID) | ORAL | 0 refills | Status: DC

## 2019-05-25 MED ORDER — SACCHAROMYCES BOULARDII 250 MG PO CAPS
250.0000 mg | ORAL_CAPSULE | Freq: Two times a day (BID) | ORAL | 0 refills | Status: DC
Start: 2019-05-25 — End: 2019-07-02

## 2019-05-25 NOTE — Progress Notes (Signed)
Established Patient Office Visit  Subjective:  Patient ID: Wanda Nelson, female    DOB: [birth year]  Age: 86 y.o. MRN: [id number]  CC:  Chief Complaint  Patient presents with  . Acute Visit    UTI    HPI Wanda Nelson presents for onset Saturday night of urinary frequency. She was up every 2 hours to void. On Sunday, she had burning with the frequency. Today, the burning is worse. She recognizes this as a UTI and is requesting Cipro. She travelled the world and has taken Cipro many times and she has never had diarrhea. She says it is the only antibiotic that works for her UTI. She denies any fever/chills/flank pain. No GI symptoms of n/v. No confusion, weakness,  and she does not feel bad. Se is eating and drinking WNL. No PCN allergy.     Chemistry      Component Value Date/Time   NA 139 03/25/2019 0848   K 3.7 03/25/2019 0848   CL 104 03/25/2019 0848   CO2 28 03/25/2019 0848   BUN 17 03/25/2019 0848   CREATININE 0.83 03/25/2019 0848      Component Value Date/Time   CALCIUM 9.3 03/25/2019 0848   ALKPHOS 87 03/25/2019 0848   AST 22 03/25/2019 0848   ALT 18 03/25/2019 0848   BILITOT 0.9 03/25/2019 0848       Past Medical History:  Diagnosis Date  . Anemia   . Glaucoma   . History of blood transfusion   . History of hiatal hernia   . Hyperlipidemia   . Incidental pulmonary nodule, > 2mm and < 14mm 03/20/2018   Noted on CT scan - needs f/u imaging 6 months  . 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 Stroke [facility])     Past Surgical History:  Procedure Laterality Date  . ABDOMINAL HYSTERECTOMY  1982   total  . BUNIONECTOMY Bilateral   . CLIPPING OF ATRIAL APPENDAGE  03/21/2018   Procedure: Clipping Of Atrial Appendage - using an AtriCure 34mm clip;  Surgeon: Rexene Alberts, MD;  Location: [facility] OR;  Service: Open Heart Surgery;;  . EXCISION OF ATRIAL MYXOMA N/A 03/21/2018   Procedure: Excision Of Atrial Myxoma;  Surgeon: Rexene Alberts, MD;   Location: [place];  Service: Open Heart Surgery;  Laterality: N/A;  . RECTOCELE REPAIR    . RIGHT/LEFT HEART CATH AND CORONARY ANGIOGRAPHY N/A 03/19/2018   Procedure: RIGHT/LEFT HEART CATH AND CORONARY ANGIOGRAPHY;  Surgeon: Wellington Hampshire, MD;  Location: [facility];  Service: Cardiovascular;  Laterality: N/A;  . TEE WITHOUT CARDIOVERSION N/A 03/21/2018   Procedure: TRANSESOPHAGEAL ECHOCARDIOGRAM (TEE);  Surgeon: Rexene Alberts, MD;  Location: [facility];  Service: Open Heart Surgery;  Laterality: N/A;  . TONSILLECTOMY      Family History  Problem Relation Age of Onset  . Colon cancer Brother   . Colon cancer Maternal Uncle   . Colon cancer Maternal Grandmother   . Breast cancer Neg Hx     Social History   Socioeconomic History  . Marital status: Widowed    Spouse name: Not on file  . Number of children: Not on file  . Years of education: Not on file  . Highest education level: Not on file  Occupational History  . Not on file  Tobacco Use  . Smoking status: Never Smoker  . Smokeless tobacco: Never Used  Substance and Sexual Activity  . Alcohol use: No    Alcohol/week:  0.0 standard drinks  . Drug use: No  . Sexual activity: Not Currently  Other Topics Concern  . Not on file  Social History Narrative   Lives [place],  husband died 2018/12/24 due to brain hemorrhage   Daughter in law Wanda Nelson    widowed   Social Determinants of Health   Financial Resource Strain:   . Difficulty of Paying Living Expenses:   Food Insecurity:   . Worried About Charity fundraiser in the Last Year:   . Arboriculturist in the Last Year:   Transportation Needs:   . Film/video editor (Medical):   Wanda Nelson Lack of Transportation (Non-Medical):   Physical Activity:   . Days of Exercise per Week:   . Minutes of Exercise per Session:   Stress:   . Feeling of Stress :   Social Connections:   . Frequency of Communication with Friends and Family:   . Frequency of Social Gatherings with  Friends and Family:   . Attends Religious Services:   . Active Member of Clubs or Organizations:   . Attends Archivist Meetings:   Wanda Nelson Marital Status:   Intimate Partner Violence:   . Fear of Current or Ex-Partner:   . Emotionally Abused:   Wanda Nelson Physically Abused:   . Sexually Abused:     Outpatient Medications Prior to Visit  Medication Sig Dispense Refill  . aspirin EC 81 MG tablet Take 1 tablet (81 mg total) by mouth daily.    Wanda Nelson atorvastatin (LIPITOR) 20 MG tablet Take 1 tablet (20 mg total) by mouth daily at 6 PM. 90 tablet 3  . Calcium Carbonate-Vitamin D (CALCIUM 500 + D) 500-125 MG-UNIT TABS Take 1 tablet by mouth daily.    . cholecalciferol (VITAMIN D) 1000 units tablet Take 1,000 Units by mouth daily.    Wanda Nelson Sodium (STOOL SOFTENER) 100 MG capsule Take 100 mg by mouth daily.    . ferrous sulfate (FEROSUL) 325 (65 FE) MG tablet Take 1 tablet (325 mg total) by mouth 2 (two) times daily with a meal. (Patient taking differently: Take 325 mg by mouth at bedtime. ) 180 tablet 3  . Flaxseed, Linseed, (FLAX SEED OIL) 1300 MG CAPS Take 1,100 mg by mouth daily.     . Multiple Vitamin (MULTIVITAMIN) tablet Take 1 tablet by mouth daily.    . Omega-3 Fatty Acids (FISH OIL) 1200 MG CAPS Take 2,400 mg by mouth 2 (two) times daily.    Wanda Nelson omeprazole (PRILOSEC) 20 MG capsule Take 1 capsule (20 mg total) by mouth daily. 90 capsule 3   No facility-administered medications prior to visit.    Allergies  Allergen Reactions  . Bee Venom Anaphylaxis and Hives  . Codeine Other (See Comments)    Altered mental status Altered mental status  . Morphine And Related     Altered mental status  . Shellfish Allergy Anaphylaxis and Hives    Mainly shrimp  . Nitrofurantoin Rash   Review of Systems Pertinent positives noted history of present illness otherwise negative.   Objective:    Physical Exam  Constitutional: She is oriented to person, place, and time. She appears well-developed  and well-nourished.  HENT:  Head: Normocephalic and atraumatic.  Eyes: Pupils are equal, round, and reactive to light.  Cardiovascular: Normal rate, regular rhythm and normal heart sounds.  Pulmonary/Chest: Effort normal and breath sounds normal.  Abdominal: Soft. There is no abdominal tenderness.  No flank pain to percussion.  Musculoskeletal:        General: No edema. Normal range of motion.     Cervical back: Normal range of motion and neck supple.  Neurological: She is alert and oriented to person, place, and time.  Skin: Skin is warm and dry.  Psychiatric: She has a normal mood and affect. Her behavior is normal. Judgment and thought content normal.  Vitals reviewed.   BP 120/64 (BP Location: Left Arm, Patient Position: Sitting, Cuff Size: Small)   Pulse 77   Temp 98.2 F (36.8 C) (Skin)   Ht 5\' 2"  (1.575 m)   Wt 121 lb 9.6 oz (55.2 kg)   SpO2 96%   BMI 22.24 kg/m  Wt Readings from Last 3 Encounters:  05/25/19 121 lb 9.6 oz (55.2 kg)  05/05/19 120 lb 3.2 oz (54.5 kg)  04/14/19 124 lb (56.2 kg)     There are no preventive care reminders to display for this patient.  There are no preventive care reminders to display for this patient.  Lab Results  Component Value Date   TSH 1.87 09/24/2018   Lab Results  Component Value Date   WBC 6.8 03/25/2019   HGB 15.1 (H) 03/25/2019   HCT 45.4 03/25/2019   MCV 94.7 03/25/2019   PLT 136.0 (L) 03/25/2019   Lab Results  Component Value Date   NA 139 03/25/2019   K 3.7 03/25/2019   CO2 28 03/25/2019   GLUCOSE 83 03/25/2019   BUN 17 03/25/2019   CREATININE 0.83 03/25/2019   BILITOT 0.9 03/25/2019   ALKPHOS 87 03/25/2019   AST 22 03/25/2019   ALT 18 03/25/2019   PROT 7.5 03/25/2019   ALBUMIN 3.9 03/25/2019   CALCIUM 9.3 03/25/2019   ANIONGAP 10 03/31/2018   GFR 65.18 03/25/2019   Lab Results  Component Value Date   CHOL 160 03/25/2019   Lab Results  Component Value Date   HDL 52.00 03/25/2019   Lab Results   Component Value Date   LDLCALC 89 03/25/2019   Lab Results  Component Value Date   TRIG 94.0 03/25/2019   Lab Results  Component Value Date   CHOLHDL 3 03/25/2019   Lab Results  Component Value Date   HGBA1C 5.3 03/20/2018      Assessment & Plan:   Problem List Items Addressed This Visit    None    Visit Diagnoses    Dysuria    -  Primary   Relevant Orders   Urine Culture   Urine Microscopic Only   POCT Urinalysis Dipstick (Completed)      Meds ordered this encounter  Medications  . cefdinir (OMNICEF) 300 MG capsule    Sig: Take 1 capsule (300 mg total) by mouth 2 (two) times daily.    Dispense:  14 capsule    Refill:  0    Order Specific Question:   Supervising Provider    Answer:   Einar Pheasant [id number]  . saccharomyces boulardii (FLORASTOR) 250 MG capsule    Sig: Take 1 capsule (250 mg total) by mouth 2 (two) times daily.    Dispense:  30 capsule    Refill:  0    Order Specific Question:   Supervising Provider    Answer:   Einar Pheasant [id number]   This 86 yo  presents with simple UTI symptoms. She is well appearing. No recent hospitalizations, instrumentation or vaginal problems. Advised her against first line x with Cipro. She walks 3 miles most day. I do not want  her to get the rare SE of tendon rupture. She agrees. I have given her a stronger cephalosporin than Keflex that should not miss many bacterial UTI organisms.   She was advised:  Begin the cefdinir 300 mg twice a day for 7 days. We have sent a urine culture to make  Sure this antibiotic covers your infection.   Be sure to stay hydrated and eat yogurt. Begin a probiotic Flora-stor 1 pill 1-2 day for good bacteri in the colon.   Follow-up: Return if symptoms worsen or fail to improve.   This visit occurred during the SARS-CoV-2 public health emergency.  Safety protocols were in place, including screening questions prior to the visit, additional usage of staff PPE, and extensive cleaning of  exam room while observing appropriate contact time as indicated for disinfecting solutions.    Denice Paradise, NP

## 2019-05-25 NOTE — Patient Instructions (Addendum)
It was a pleasure to meet you today.   Begin the cefdinir 300 mg twice a day for 7 days. We have sent a urine culture to make  Sure this antibiotic covers your infection.   Be sure to stay hydrated and eat yogurt. Begin a probiotic Flora-stor 1 pill 1-2 day for good bacteri in the colon.       Urinary Tract Infection, Adult  A urinary tract infection (UTI) is an infection of any part of the urinary tract. The urinary tract includes the kidneys, ureters, bladder, and urethra. These organs make, store, and get rid of urine in the body. Your health care provider may use other names to describe the infection. An upper UTI affects the ureters and kidneys (pyelonephritis). A lower UTI affects the bladder (cystitis) and urethra (urethritis). What are the causes? Most urinary tract infections are caused by bacteria in your genital area, around the entrance to your urinary tract (urethra). These bacteria grow and cause inflammation of your urinary tract. What increases the risk? You are more likely to develop this condition if:  You have a urinary catheter that stays in place (indwelling).  You are not able to control when you urinate or have a bowel movement (you have incontinence).  You are female and you: ? Use a spermicide or diaphragm for birth control. ? Have low estrogen levels. ? Are pregnant.  You have certain genes that increase your risk (genetics).  You are sexually active.  You take antibiotic medicines.  You have a condition that causes your flow of urine to slow down, such as: ? An enlarged prostate, if you are female. ? Blockage in your urethra (stricture). ? A kidney stone. ? A nerve condition that affects your bladder control (neurogenic bladder). ? Not getting enough to drink, or not urinating often.  You have certain medical conditions, such as: ? Diabetes. ? A weak disease-fighting system (immunesystem). ? Sickle cell disease. ? Gout. ? Spinal cord  injury. What are the signs or symptoms? Symptoms of this condition include:  Needing to urinate right away (urgently).  Frequent urination or passing small amounts of urine frequently.  Pain or burning with urination.  Blood in the urine.  Urine that smells bad or unusual.  Trouble urinating.  Cloudy urine.  Vaginal discharge, if you are female.  Pain in the abdomen or the lower back. You may also have:  Vomiting or a decreased appetite.  Confusion.  Irritability or tiredness.  A fever.  Diarrhea. The first symptom in older adults may be confusion. In some cases, they may not have any symptoms until the infection has worsened. How is this diagnosed? This condition is diagnosed based on your medical history and a physical exam. You may also have other tests, including:  Urine tests.  Blood tests.  Tests for sexually transmitted infections (STIs). If you have had more than one UTI, a cystoscopy or imaging studies may be done to determine the cause of the infections. How is this treated? Treatment for this condition includes:  Antibiotic medicine.  Over-the-counter medicines to treat discomfort.  Drinking enough water to stay hydrated. If you have frequent infections or have other conditions such as a kidney stone, you may need to see a health care provider who specializes in the urinary tract (urologist). In rare cases, urinary tract infections can cause sepsis. Sepsis is a life-threatening condition that occurs when the body responds to an infection. Sepsis is treated in the hospital with IV antibiotics, fluids,  and other medicines. Follow these instructions at home:  Medicines  Take over-the-counter and prescription medicines only as told by your health care provider.  If you were prescribed an antibiotic medicine, take it as told by your health care provider. Do not stop using the antibiotic even if you start to feel better. General instructions  Make  sure you: ? Empty your bladder often and completely. Do not hold urine for long periods of time. ? Empty your bladder after sex. ? Wipe from front to back after a bowel movement if you are female. Use each tissue one time when you wipe.  Drink enough fluid to keep your urine pale yellow.  Keep all follow-up visits as told by your health care provider. This is important. Contact a health care provider if:  Your symptoms do not get better after 1-2 days.  Your symptoms go away and then return. Get help right away if you have:  Severe pain in your back or your lower abdomen.  A fever.  Nausea or vomiting. Summary  A urinary tract infection (UTI) is an infection of any part of the urinary tract, which includes the kidneys, ureters, bladder, and urethra.  Most urinary tract infections are caused by bacteria in your genital area, around the entrance to your urinary tract (urethra).  Treatment for this condition often includes antibiotic medicines.  If you were prescribed an antibiotic medicine, take it as told by your health care provider. Do not stop using the antibiotic even if you start to feel better.  Keep all follow-up visits as told by your health care provider. This is important. This information is not intended to replace advice given to you by your health care provider. Make sure you discuss any questions you have with your health care provider. Document Revised: 12/12/2017 Document Reviewed: 07/04/2017 Elsevier Patient Education  2020 [employer].

## 2019-05-26 LAB — URINALYSIS, MICROSCOPIC ONLY: RBC / HPF: NONE SEEN (ref 0–?)

## 2019-05-27 LAB — URINE CULTURE
MICRO NUMBER:: 10485382
SPECIMEN QUALITY:: ADEQUATE

## 2019-06-12 ENCOUNTER — Ambulatory Visit (INDEPENDENT_AMBULATORY_CARE_PROVIDER_SITE_OTHER): Payer: Medicare Other

## 2019-06-12 ENCOUNTER — Other Ambulatory Visit: Payer: Self-pay

## 2019-06-12 DIAGNOSIS — D151 Benign neoplasm of heart: Secondary | ICD-10-CM

## 2019-06-12 DIAGNOSIS — Z86018 Personal history of other benign neoplasm: Secondary | ICD-10-CM

## 2019-06-24 ENCOUNTER — Ambulatory Visit (INDEPENDENT_AMBULATORY_CARE_PROVIDER_SITE_OTHER): Payer: Medicare Other | Admitting: Internal Medicine

## 2019-06-24 ENCOUNTER — Encounter: Payer: Self-pay | Admitting: Internal Medicine

## 2019-06-24 ENCOUNTER — Other Ambulatory Visit: Payer: Self-pay

## 2019-06-24 VITALS — BP 108/64 | HR 62 | Temp 98.3°F | Ht 62.0 in | Wt 119.4 lb

## 2019-06-24 DIAGNOSIS — R399 Unspecified symptoms and signs involving the genitourinary system: Secondary | ICD-10-CM | POA: Diagnosis not present

## 2019-06-24 DIAGNOSIS — B9620 Unspecified Escherichia coli [E. coli] as the cause of diseases classified elsewhere: Secondary | ICD-10-CM

## 2019-06-24 DIAGNOSIS — N390 Urinary tract infection, site not specified: Secondary | ICD-10-CM

## 2019-06-24 MED ORDER — CIPROFLOXACIN HCL 500 MG PO TABS: 500.0000 mg | ORAL_TABLET | Freq: Two times a day (BID) | ORAL | 0 refills | Status: DC

## 2019-06-24 NOTE — Progress Notes (Signed)
Chief Complaint  Patient presents with  . Urinary Tract Infection   F/u  1. UTI noted 05/25/19 E coli given Cefdinir 300 bid x 1 week and did not help and yesterday 6/15 with burning, pain increased freq increased water and cranberry supplements  She also had lower ab ttp   Review of Systems  Respiratory: Negative for shortness of breath.   Genitourinary: Positive for dysuria, frequency and urgency.   Past Medical History:  Diagnosis Date  . Anemia   . Glaucoma   . History of blood transfusion   . History of hiatal hernia   . Hyperlipidemia   . Incidental pulmonary nodule, > 34mm and < 32mm 03/20/2018   Noted on CT scan - needs f/u imaging 6 months  . 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 Stroke [facility])    Past Surgical History:  Procedure Laterality Date  . ABDOMINAL HYSTERECTOMY  1982   total  . BUNIONECTOMY Bilateral   . CLIPPING OF ATRIAL APPENDAGE  03/21/2018   Procedure: Clipping Of Atrial Appendage - using an AtriCure 51mm clip;  Surgeon: Rexene Alberts, MD;  Location: [facility] OR;  Service: Open Heart Surgery;;  . EXCISION OF ATRIAL MYXOMA N/A 03/21/2018   Procedure: Excision Of Atrial Myxoma;  Surgeon: Rexene Alberts, MD;  Location: [place];  Service: Open Heart Surgery;  Laterality: N/A;  . RECTOCELE REPAIR    . RIGHT/LEFT HEART CATH AND CORONARY ANGIOGRAPHY N/A 03/19/2018   Procedure: RIGHT/LEFT HEART CATH AND CORONARY ANGIOGRAPHY;  Surgeon: Wellington Hampshire, MD;  Location: [place] [facility] LAB;  Service: Cardiovascular;  Laterality: N/A;  . TEE WITHOUT CARDIOVERSION N/A 03/21/2018   Procedure: TRANSESOPHAGEAL ECHOCARDIOGRAM (TEE);  Surgeon: Rexene Alberts, MD;  Location: [place];  Service: Open Heart Surgery;  Laterality: N/A;  . TONSILLECTOMY     Family History  Problem Relation Age of Onset  . Colon cancer Brother   . Colon cancer Maternal Uncle   . Colon cancer Maternal Grandmother   . Breast cancer Neg Hx    Social History   Socioeconomic History  . Marital  status: Widowed    Spouse name: Not on file  . Number of children: Not on file  . Years of education: Not on file  . Highest education level: Not on file  Occupational History  . Not on file  Tobacco Use  . Smoking status: Never Smoker  . Smokeless tobacco: Never Used  Vaping Use  . Vaping Use: Never used  Substance and Sexual Activity  . Alcohol use: No    Alcohol/week: 0.0 standard drinks  . Drug use: No  . Sexual activity: Not Currently  Other Topics Concern  . Not on file  Social History Narrative   Lives [place],  husband died 12-28-2018 due to brain hemorrhage   Daughter in law Delmi Fulfer    widowed   Social Determinants of Health   Financial Resource Strain:   . Difficulty of Paying Living Expenses:   Food Insecurity:   . Worried About Charity fundraiser in the Last Year:   . Arboriculturist in the Last Year:   Transportation Needs:   . Film/video editor (Medical):   Marland Kitchen Lack of Transportation (Non-Medical):   Physical Activity:   . Days of Exercise per Week:   . Minutes of Exercise per Session:   Stress:   . Feeling of Stress :   Social Connections:   . Frequency of Communication with  Friends and Family:   . Frequency of Social Gatherings with Friends and Family:   . Attends Religious Services:   . Active Member of Clubs or Organizations:   . Attends Archivist Meetings:   Marland Kitchen Marital Status:   Intimate Partner Violence:   . Fear of Current or Ex-Partner:   . Emotionally Abused:   Marland Kitchen Physically Abused:   . Sexually Abused:    Current Meds  Medication Sig  . aspirin EC 81 MG tablet Take 1 tablet (81 mg total) by mouth daily.  Marland Kitchen atorvastatin (LIPITOR) 20 MG tablet Take 1 tablet (20 mg total) by mouth daily at 6 PM.  . Calcium Carbonate-Vitamin D (CALCIUM 500 + D) 500-125 MG-UNIT TABS Take 1 tablet by mouth daily.  . cholecalciferol (VITAMIN D) 1000 units tablet Take 1,000 Units by mouth daily.  . ferrous sulfate (FEROSUL) 325 (65 FE) MG  tablet Take 1 tablet (325 mg total) by mouth 2 (two) times daily with a meal.  . Flaxseed, Linseed, (FLAX SEED OIL) 1300 MG CAPS Take 1,100 mg by mouth daily.   . Multiple Vitamin (MULTIVITAMIN) tablet Take 1 tablet by mouth daily.  . Omega-3 Fatty Acids (FISH OIL) 1200 MG CAPS Take 2,400 mg by mouth 2 (two) times daily.  Marland Kitchen omeprazole (PRILOSEC) 20 MG capsule Take 1 capsule (20 mg total) by mouth daily.   Allergies  Allergen Reactions  . Bee Venom Anaphylaxis and Hives  . Codeine Other (See Comments)    Altered mental status Altered mental status  . Morphine And Related     Altered mental status  . Shellfish Allergy Anaphylaxis and Hives    Mainly shrimp  . Bactrim [Sulfamethoxazole-Trimethoprim]     ? Reaction    . Nitrofurantoin Rash   Recent Results (from the past 2160 hour(s))  POCT Urinalysis Dipstick     Status: Abnormal   Collection Time: 05/25/19  2:41 PM  Result Value Ref Range   Color, UA yellow    Clarity, UA cloudy    Glucose, UA Negative Negative   Bilirubin, UA neg    Ketones, UA neg    Spec Grav, UA 1.015 1.010 - 1.025   Blood, UA 3+    pH, UA 6.0 5.0 - 8.0   Protein, UA Positive (A) Negative   Urobilinogen, UA negative (A) 0.2 or 1.0 E.U./dL   Nitrite, UA neg    Leukocytes, UA Large (3+) (A) Negative   Appearance     Odor    Urine Culture     Status: Abnormal   Collection Time: 05/25/19  2:46 PM   Specimen: Urine  Result Value Ref Range   MICRO NUMBER: [id number]    SPECIMEN QUALITY: Adequate    Sample Source URINE    STATUS: FINAL    ISOLATE 1: Escherichia coli (A)     Comment: Greater than 100,000 CFU/mL of Escherichia coli      Susceptibility   Escherichia coli - URINE CULTURE, REFLEX    AMOX/CLAVULANIC <=2 Sensitive     AMPICILLIN 8 Sensitive     AMPICILLIN/SULBACTAM <=2 Sensitive     CEFAZOLIN* <=4 Not Reportable      * For infections other than uncomplicated UTIcaused by E. coli, K. pneumoniae or P. mirabilis:Cefazolin is resistant if MIC >  or = 8 mcg/mL.(Distinguishing susceptible versus intermediatefor isolates with MIC < or = 4 mcg/mL requiresadditional testing.)For uncomplicated UTI caused by E. coli,K. pneumoniae or P. mirabilis: Cefazolin issusceptible if MIC <32 mcg/mL and predictssusceptible  to the oral agents cefaclor, cefdinir,cefpodoxime, cefprozil, cefuroxime, cephalexinand loracarbef.    CEFEPIME <=1 Sensitive     CEFTRIAXONE <=1 Sensitive     CIPROFLOXACIN <=0.25 Sensitive     LEVOFLOXACIN <=0.12 Sensitive     ERTAPENEM <=0.5 Sensitive     GENTAMICIN <=1 Sensitive     IMIPENEM <=0.25 Sensitive     NITROFURANTOIN <=16 Sensitive     PIP/TAZO <=4 Sensitive     TOBRAMYCIN <=1 Sensitive     TRIMETH/SULFA* <=20 Sensitive      * For infections other than uncomplicated UTIcaused by E. coli, K. pneumoniae or P. mirabilis:Cefazolin is resistant if MIC > or = 8 mcg/mL.(Distinguishing susceptible versus intermediatefor isolates with MIC < or = 4 mcg/mL requiresadditional testing.)For uncomplicated UTI caused by E. coli,K. pneumoniae or P. mirabilis: Cefazolin issusceptible if MIC <32 mcg/mL and predictssusceptible to the oral agents cefaclor, cefdinir,cefpodoxime, cefprozil, cefuroxime, cephalexinand loracarbef.Legend:S = Susceptible  I = IntermediateR = Resistant  NS = Not susceptible* = Not tested  NR = Not reported**NN = See antimicrobic comments  Urine Microscopic Only     Status: Abnormal   Collection Time: 05/25/19  2:46 PM  Result Value Ref Range   WBC, UA TNTC(>50/hpf) (A) 0-2/hpf   RBC / HPF none seen 0-2/hpf   Squamous Epithelial / LPF Rare(0-4/hpf) Rare(0-4/hpf)   Bacteria, UA Many(>50/hpf) (A) None   Objective  Body mass index is 21.84 kg/m. Wt Readings from Last 3 Encounters:  06/24/19 119 lb 6.4 oz (54.2 kg)  05/25/19 121 lb 9.6 oz (55.2 kg)  05/05/19 120 lb 3.2 oz (54.5 kg)   Temp Readings from Last 3 Encounters:  06/24/19 98.3 F (36.8 C) (Temporal)  05/25/19 98.2 F (36.8 C) (Skin)  05/05/19 98.3 F  (36.8 C) (Skin)   BP Readings from Last 3 Encounters:  06/24/19 108/64  05/25/19 120/64  05/05/19 (!) 112/56   Pulse Readings from Last 3 Encounters:  06/24/19 62  05/25/19 77  05/05/19 (!) 58    Physical Exam Vitals and nursing note reviewed.  Constitutional:      Appearance: Normal appearance. She is well-developed and well-groomed.  HENT:     Head: Normocephalic and atraumatic.  Eyes:     Conjunctiva/sclera: Conjunctivae normal.     Pupils: Pupils are equal, round, and reactive to light.  Cardiovascular:     Rate and Rhythm: Normal rate and regular rhythm.     Heart sounds: Normal heart sounds. No murmur heard.   Pulmonary:     Effort: Pulmonary effort is normal.     Breath sounds: Normal breath sounds.  Abdominal:     General: There is no distension.     Tenderness: There is no abdominal tenderness. There is no right CVA tenderness or left CVA tenderness.  Skin:    General: Skin is warm and dry.  Neurological:     General: No focal deficit present.     Mental Status: She is alert and oriented to person, place, and time. Mental status is at baseline.     Gait: Gait normal.  Psychiatric:        Attention and Perception: Attention and perception normal.        Mood and Affect: Mood and affect normal.        Speech: Speech normal.        Behavior: Behavior normal. Behavior is cooperative.        Thought Content: Thought content normal.        Cognition and Memory:  Cognition and memory normal.        Judgment: Judgment normal.     Assessment  Plan  UTI symptoms - Plan: Urinalysis, Routine w reflex microscopic, Urine Culture, ciprofloxacin (CIPRO) 500 MG tablet  E. coli UTI - Plan: ciprofloxacin (CIPRO) 500 MG tablet   Going on cruise 08/2020 [place] wants prn cipro and zpack prn   Provider: Dr. Olivia Mackie McLean-[facility]ocuz[facility]

## 2019-06-24 NOTE — Progress Notes (Signed)
Urinary Symptoms such as frequent and painful urination started yesterday, Tuesday morning. Includes burning during urination.

## 2019-06-24 NOTE — Patient Instructions (Signed)
AZO with pyridium over the counter for 2 days as needed for burning  Water increase 55-64 oz daily  Cranberry supplements   Urinary Tract Infection, Adult  A urinary tract infection (UTI) is an infection of any part of the urinary tract. The urinary tract includes the kidneys, ureters, bladder, and urethra. These organs make, store, and get rid of urine in the body. Your health care provider may use other names to describe the infection. An upper UTI affects the ureters and kidneys (pyelonephritis). A lower UTI affects the bladder (cystitis) and urethra (urethritis). What are the causes? Most urinary tract infections are caused by bacteria in your genital area, around the entrance to your urinary tract (urethra). These bacteria grow and cause inflammation of your urinary tract. What increases the risk? You are more likely to develop this condition if:  You have a urinary catheter that stays in place (indwelling).  You are not able to control when you urinate or have a bowel movement (you have incontinence).  You are female and you: ? Use a spermicide or diaphragm for birth control. ? Have low estrogen levels. ? Are pregnant.  You have certain genes that increase your risk (genetics).  You are sexually active.  You take antibiotic medicines.  You have a condition that causes your flow of urine to slow down, such as: ? An enlarged prostate, if you are female. ? Blockage in your urethra (stricture). ? A kidney stone. ? A nerve condition that affects your bladder control (neurogenic bladder). ? Not getting enough to drink, or not urinating often.  You have certain medical conditions, such as: ? Diabetes. ? A weak disease-fighting system (immunesystem). ? Sickle cell disease. ? Gout. ? Spinal cord injury. What are the signs or symptoms? Symptoms of this condition include:  Needing to urinate right away (urgently).  Frequent urination or passing small amounts of urine  frequently.  Pain or burning with urination.  Blood in the urine.  Urine that smells bad or unusual.  Trouble urinating.  Cloudy urine.  Vaginal discharge, if you are female.  Pain in the abdomen or the lower back. You may also have:  Vomiting or a decreased appetite.  Confusion.  Irritability or tiredness.  A fever.  Diarrhea. The first symptom in older adults may be confusion. In some cases, they may not have any symptoms until the infection has worsened. How is this diagnosed? This condition is diagnosed based on your medical history and a physical exam. You may also have other tests, including:  Urine tests.  Blood tests.  Tests for sexually transmitted infections (STIs). If you have had more than one UTI, a cystoscopy or imaging studies may be done to determine the cause of the infections. How is this treated? Treatment for this condition includes:  Antibiotic medicine.  Over-the-counter medicines to treat discomfort.  Drinking enough water to stay hydrated. If you have frequent infections or have other conditions such as a kidney stone, you may need to see a health care provider who specializes in the urinary tract (urologist). In rare cases, urinary tract infections can cause sepsis. Sepsis is a life-threatening condition that occurs when the body responds to an infection. Sepsis is treated in the hospital with IV antibiotics, fluids, and other medicines. Follow these instructions at home:  Medicines  Take over-the-counter and prescription medicines only as told by your health care provider.  If you were prescribed an antibiotic medicine, take it as told by your health care provider. Do  not stop using the antibiotic even if you start to feel better. General instructions  Make sure you: ? Empty your bladder often and completely. Do not hold urine for long periods of time. ? Empty your bladder after sex. ? Wipe from front to back after a bowel movement  if you are female. Use each tissue one time when you wipe.  Drink enough fluid to keep your urine pale yellow.  Keep all follow-up visits as told by your health care provider. This is important. Contact a health care provider if:  Your symptoms do not get better after 1-2 days.  Your symptoms go away and then return. Get help right away if you have:  Severe pain in your back or your lower abdomen.  A fever.  Nausea or vomiting. Summary  A urinary tract infection (UTI) is an infection of any part of the urinary tract, which includes the kidneys, ureters, bladder, and urethra.  Most urinary tract infections are caused by bacteria in your genital area, around the entrance to your urinary tract (urethra).  Treatment for this condition often includes antibiotic medicines.  If you were prescribed an antibiotic medicine, take it as told by your health care provider. Do not stop using the antibiotic even if you start to feel better.  Keep all follow-up visits as told by your health care provider. This is important. This information is not intended to replace advice given to you by your health care provider. Make sure you discuss any questions you have with your health care provider. Document Revised: 12/12/2017 Document Reviewed: 07/04/2017 Elsevier Patient Education  2020 [employer].

## 2019-06-26 LAB — URINALYSIS, ROUTINE W REFLEX MICROSCOPIC
Bilirubin Urine: NEGATIVE
Glucose, UA: NEGATIVE
Hyaline Cast: NONE SEEN /LPF
Ketones, ur: NEGATIVE
Nitrite: POSITIVE — AB
Specific Gravity, Urine: 1.013 (ref 1.001–1.03)
Squamous Epithelial / HPF: NONE SEEN /HPF (ref <=5)
WBC, UA: >=60 /HPF — AB (ref 0–5)
pH: 6.5 (ref 5.0–8.0)

## 2019-06-26 LAB — URINE CULTURE
MICRO NUMBER:: 10598200
SPECIMEN QUALITY:: ADEQUATE

## 2019-06-29 ENCOUNTER — Telehealth: Payer: Self-pay | Admitting: Internal Medicine

## 2019-06-29 DIAGNOSIS — N3000 Acute cystitis without hematuria: Secondary | ICD-10-CM

## 2019-06-29 NOTE — Telephone Encounter (Signed)
Patient scheduled for Urine test tomorrow, 6/21 at 3:30 with the lab.   Patient then scheduled for this Thursday at 3:15 with Dr Caryl Bis to be seen.

## 2019-06-29 NOTE — Telephone Encounter (Signed)
Patient saw Dr. Aundra Dubin last week for a UTI. Patient has finished her antibiotics. She is still having symptoms and would like to do another urine.

## 2019-06-29 NOTE — Telephone Encounter (Signed)
She can have another urine collected and then schedule an appointment to be seen. It looks like this is the second time she has been treated for this UTI so needs to be seen again.

## 2019-06-29 NOTE — Telephone Encounter (Signed)
Okay to order labs or does Patient need an appointment?

## 2019-06-30 ENCOUNTER — Other Ambulatory Visit (INDEPENDENT_AMBULATORY_CARE_PROVIDER_SITE_OTHER): Payer: Medicare Other

## 2019-06-30 ENCOUNTER — Other Ambulatory Visit: Payer: Self-pay

## 2019-06-30 DIAGNOSIS — N3000 Acute cystitis without hematuria: Secondary | ICD-10-CM

## 2019-06-30 LAB — POCT URINALYSIS DIPSTICK
Bilirubin, UA: NEGATIVE
Blood, UA: NEGATIVE
Glucose, UA: NEGATIVE
Ketones, UA: NEGATIVE
Nitrite, UA: NEGATIVE
Protein, UA: NEGATIVE
Spec Grav, UA: 1.02 (ref 1.010–1.025)
Urobilinogen, UA: 0.2 E.U./dL
pH, UA: 6 (ref 5.0–8.0)

## 2019-07-01 LAB — URINE CULTURE
MICRO NUMBER:: 10619848
SPECIMEN QUALITY:: ADEQUATE

## 2019-07-02 ENCOUNTER — Other Ambulatory Visit: Payer: Self-pay

## 2019-07-02 ENCOUNTER — Ambulatory Visit (INDEPENDENT_AMBULATORY_CARE_PROVIDER_SITE_OTHER): Payer: Medicare Other | Admitting: Family Medicine

## 2019-07-02 ENCOUNTER — Encounter: Payer: Self-pay | Admitting: Family Medicine

## 2019-07-02 DIAGNOSIS — N3000 Acute cystitis without hematuria: Secondary | ICD-10-CM

## 2019-07-02 NOTE — Assessment & Plan Note (Signed)
Symptoms have resolved.  She will monitor for recurrence. 

## 2019-07-02 NOTE — Progress Notes (Signed)
  Tommi Rumps, MD Phone: [phone]  Wanda Nelson is a 86 y.o. female who presents today for follow-up.  UTI: Patient was previously treated x2 for UTI.  Recently took Cipro and notes her symptoms seem to persist after she finished antibiotics since she called our office they have resolved.  She notes no dysuria, frequency, hematuria, or vaginal discharge.  No abdominal pain.  She notes her chronic stable urinary urgency.  Urine culture was negative for infection.  Social History   Tobacco Use  Smoking Status Never Smoker  Smokeless Tobacco Never Used     ROS see history of present illness  Objective  Physical Exam Vitals:   07/02/19 1524  BP: 140/70  Pulse: (!) 58  Temp: 97.9 F (36.6 C)  SpO2: 95%    BP Readings from Last 3 Encounters:  07/02/19 140/70  06/24/19 108/64  05/25/19 120/64   Wt Readings from Last 3 Encounters:  07/02/19 121 lb 6.4 oz (55.1 kg)  06/24/19 119 lb 6.4 oz (54.2 kg)  05/25/19 121 lb 9.6 oz (55.2 kg)    Physical Exam Abdominal:     General: Bowel sounds are normal. There is no distension.     Palpations: Abdomen is soft.     Tenderness: There is no abdominal tenderness. There is no guarding or rebound.      Assessment/Plan: Please see individual problem list.  UTI (urinary tract infection) Symptoms have resolved.  She will monitor for recurrence.   No orders of the defined types were placed in this encounter.   No orders of the defined types were placed in this encounter.   This visit occurred during the SARS-CoV-2 public health emergency.  Safety protocols were in place, including screening questions prior to the visit, additional usage of staff PPE, and extensive cleaning of exam room while observing appropriate contact time as indicated for disinfecting solutions.    Tommi Rumps, MD [place]

## 2019-07-02 NOTE — Patient Instructions (Signed)
Nice to see you. If you have recurrent symptoms please let us know.

## 2019-08-14 IMAGING — CR DG CHEST 2V
2 series · 2 of 2 positions shown · non-contrast
Comparison: None.

CLINICAL DATA: Nonproductive cough for the past 10 days.

EXAM:
CHEST - 2 VIEW

[chest pa]
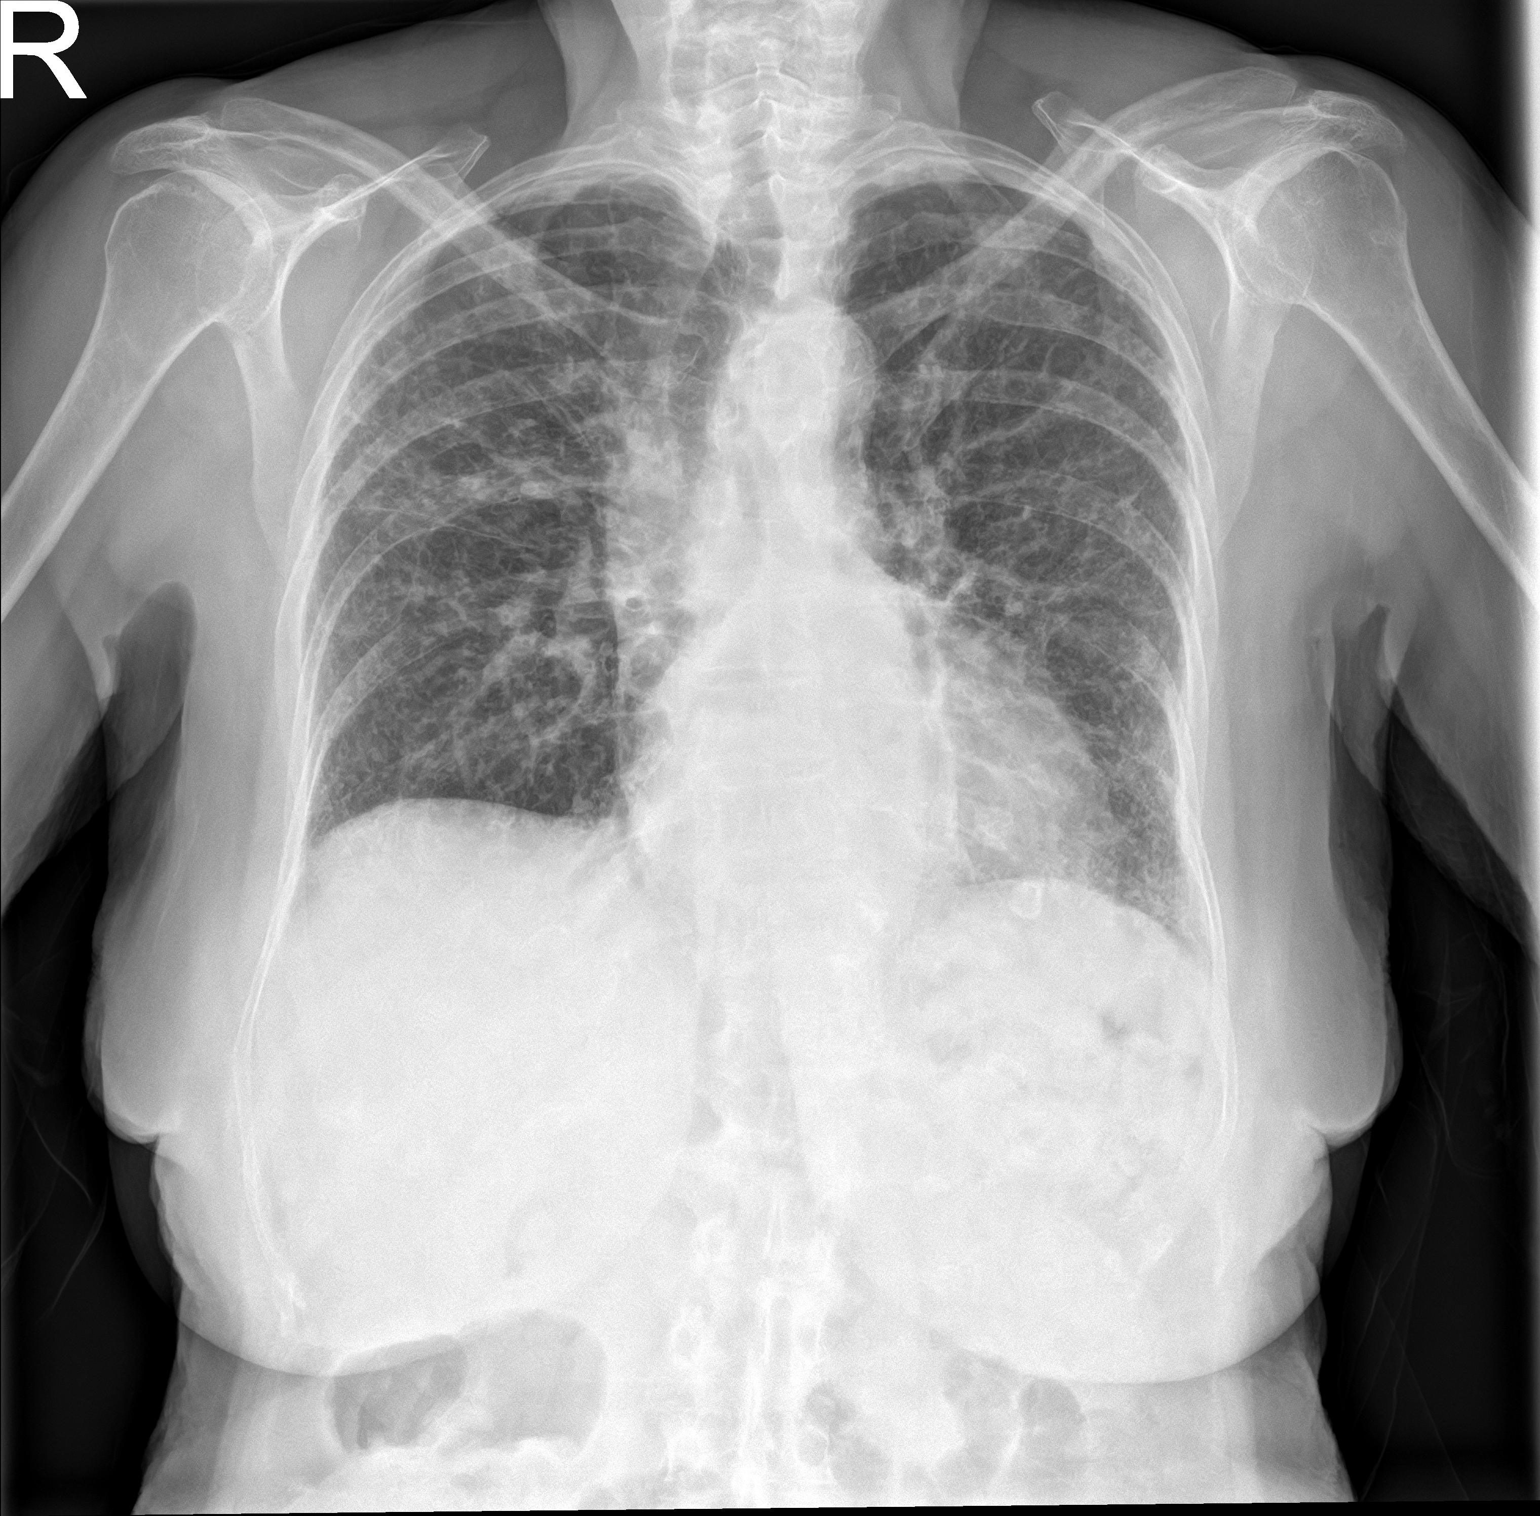

[chest lat]
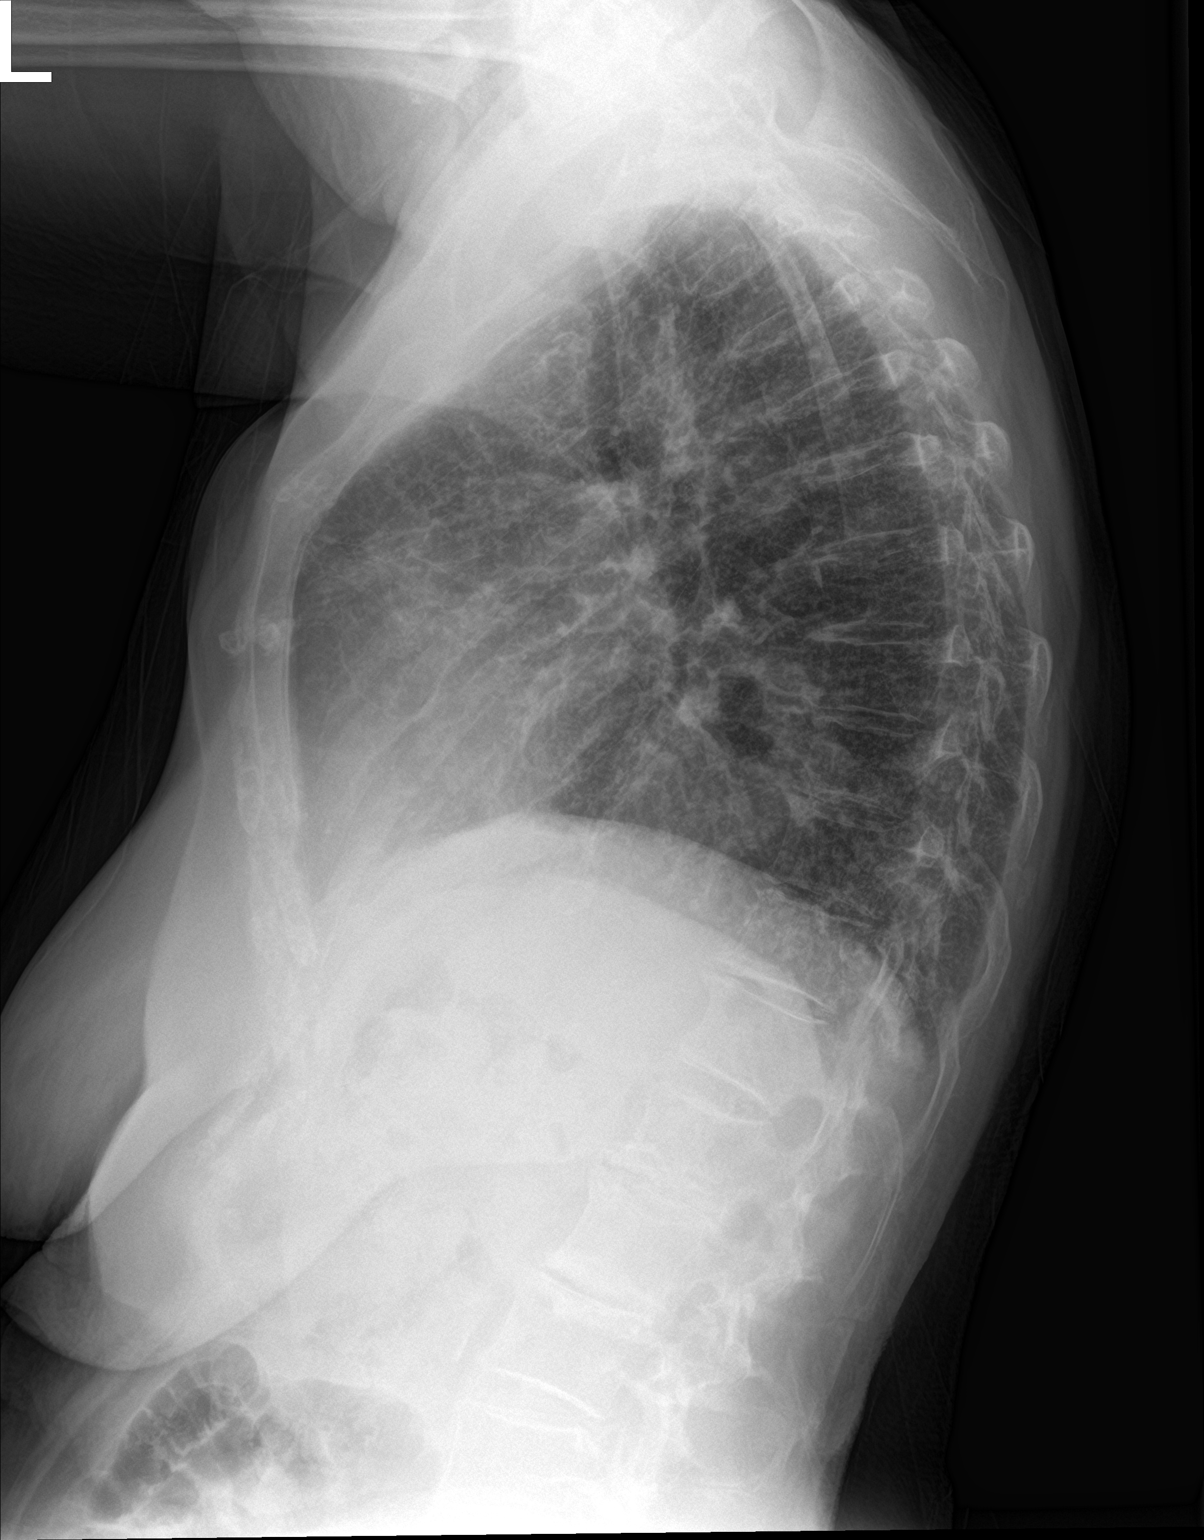

[2 of 2 positions shown; findings below may reference images not displayed]

FINDINGS: Mild cardiomegaly. Mild central bronchitic changes with diffusely
coarsened interstitial markings. No focal consolidation, pleural
effusion, or pneumothorax. No acute osseous abnormality. Hiatal
hernia.
IMPRESSION: 1. Bronchitic changes.  No consolidation.
2. Diffusely coarsened interstitial markings may reflect underlying
interstitial lung disease.

## 2019-10-15 DIAGNOSIS — H02886 Meibomian gland dysfunction of left eye, unspecified eyelid: Secondary | ICD-10-CM | POA: Diagnosis not present

## 2019-10-15 DIAGNOSIS — Z961 Presence of intraocular lens: Secondary | ICD-10-CM | POA: Diagnosis not present

## 2019-10-15 DIAGNOSIS — H02883 Meibomian gland dysfunction of right eye, unspecified eyelid: Secondary | ICD-10-CM | POA: Diagnosis not present

## 2019-10-15 DIAGNOSIS — H401112 Primary open-angle glaucoma, right eye, moderate stage: Secondary | ICD-10-CM | POA: Diagnosis not present

## 2019-10-15 DIAGNOSIS — H401123 Primary open-angle glaucoma, left eye, severe stage: Secondary | ICD-10-CM | POA: Diagnosis not present

## 2019-10-30 DIAGNOSIS — Z23 Encounter for immunization: Secondary | ICD-10-CM | POA: Diagnosis not present

## 2019-11-03 ENCOUNTER — Telehealth: Payer: Self-pay | Admitting: *Deleted

## 2019-11-03 NOTE — Telephone Encounter (Signed)
Please place future orders for lab appt.   Only orders found are for urine, ordered in March

## 2019-11-04 ENCOUNTER — Other Ambulatory Visit: Payer: Self-pay | Admitting: Internal Medicine

## 2019-11-04 DIAGNOSIS — N390 Urinary tract infection, site not specified: Secondary | ICD-10-CM

## 2019-11-04 DIAGNOSIS — Z1329 Encounter for screening for other suspected endocrine disorder: Secondary | ICD-10-CM

## 2019-11-04 DIAGNOSIS — E785 Hyperlipidemia, unspecified: Secondary | ICD-10-CM

## 2019-11-04 DIAGNOSIS — D509 Iron deficiency anemia, unspecified: Secondary | ICD-10-CM

## 2019-11-04 NOTE — Telephone Encounter (Signed)
Lab orders in thanks

## 2019-11-05 ENCOUNTER — Ambulatory Visit (INDEPENDENT_AMBULATORY_CARE_PROVIDER_SITE_OTHER): Payer: Medicare Other | Admitting: Cardiovascular Disease

## 2019-11-05 ENCOUNTER — Other Ambulatory Visit (INDEPENDENT_AMBULATORY_CARE_PROVIDER_SITE_OTHER): Payer: Medicare Other

## 2019-11-05 ENCOUNTER — Encounter: Payer: Self-pay | Admitting: Cardiovascular Disease

## 2019-11-05 ENCOUNTER — Other Ambulatory Visit: Payer: Self-pay

## 2019-11-05 VITALS — BP 160/78 | HR 51 | Ht 62.0 in | Wt 119.0 lb

## 2019-11-05 DIAGNOSIS — N3000 Acute cystitis without hematuria: Secondary | ICD-10-CM

## 2019-11-05 DIAGNOSIS — N390 Urinary tract infection, site not specified: Secondary | ICD-10-CM | POA: Diagnosis not present

## 2019-11-05 DIAGNOSIS — D509 Iron deficiency anemia, unspecified: Secondary | ICD-10-CM

## 2019-11-05 DIAGNOSIS — E785 Hyperlipidemia, unspecified: Secondary | ICD-10-CM | POA: Diagnosis not present

## 2019-11-05 DIAGNOSIS — Z1329 Encounter for screening for other suspected endocrine disorder: Secondary | ICD-10-CM

## 2019-11-05 DIAGNOSIS — D151 Benign neoplasm of heart: Secondary | ICD-10-CM

## 2019-11-05 LAB — LIPID PANEL
Cholesterol: 168 mg/dL (ref 0–200)
HDL: 61.6 mg/dL (ref >39.00)
LDL Cholesterol: 89 mg/dL (ref 0–99)
NonHDL: 106.59
Total CHOL/HDL Ratio: 3
Triglycerides: 90 mg/dL (ref 0.0–149.0)
VLDL: 18 mg/dL (ref 0.0–40.0)

## 2019-11-05 LAB — COMPREHENSIVE METABOLIC PANEL
ALT: 20 U/L (ref 0–35)
AST: 22 U/L (ref 0–37)
Albumin: 4.1 g/dL (ref 3.5–5.2)
Alkaline Phosphatase: 87 U/L (ref 39–117)
BUN: 14 mg/dL (ref 6–23)
CO2: 28 mEq/L (ref 19–32)
Calcium: 9.2 mg/dL (ref 8.4–10.5)
Chloride: 105 mEq/L (ref 96–112)
Creatinine, Ser: 0.88 mg/dL (ref 0.40–1.20)
GFR: 59.42 mL/min — ABNORMAL LOW (ref >60.00)
Glucose, Bld: 76 mg/dL (ref 70–99)
Potassium: 3.9 mEq/L (ref 3.5–5.1)
Sodium: 140 mEq/L (ref 135–145)
Total Bilirubin: 0.8 mg/dL (ref 0.2–1.2)
Total Protein: 6.8 g/dL (ref 6.0–8.3)

## 2019-11-05 LAB — CBC WITH DIFFERENTIAL/PLATELET
Basophils Absolute: 0.1 10*3/uL (ref 0.0–0.1)
Basophils Relative: 0.8 % (ref 0.0–3.0)
Eosinophils Absolute: 0.3 10*3/uL (ref 0.0–0.7)
Eosinophils Relative: 4.3 % (ref 0.0–5.0)
HCT: 45.3 % (ref 36.0–46.0)
Hemoglobin: 15.1 g/dL — ABNORMAL HIGH (ref 12.0–15.0)
Lymphocytes Relative: 22 % (ref 12.0–46.0)
Lymphs Abs: 1.6 10*3/uL (ref 0.7–4.0)
MCHC: 33.3 g/dL (ref 30.0–36.0)
MCV: 94.1 fl (ref 78.0–100.0)
Monocytes Absolute: 0.5 10*3/uL (ref 0.1–1.0)
Monocytes Relative: 7.2 % (ref 3.0–12.0)
Neutro Abs: 4.7 10*3/uL (ref 1.4–7.7)
Neutrophils Relative %: 65.7 % (ref 43.0–77.0)
Platelets: 166 10*3/uL (ref 150.0–400.0)
RBC: 4.82 Mil/uL (ref 3.87–5.11)
RDW: 12.9 % (ref 11.5–15.5)
WBC: 7.1 10*3/uL (ref 4.0–10.5)

## 2019-11-05 LAB — TSH: TSH: 1.91 u[IU]/mL (ref 0.35–4.50)

## 2019-11-05 NOTE — Progress Notes (Signed)
Cardiology Office Note   Date:  11/05/2019   ID:  Wanda Nelson, Wanda Nelson 08-20-1931, MRN [id number]  PCP:  McLean-Scocuzza, Nino Glow, MD  Cardiologist:   [name], MD   Chief Complaint  Patient presents with  . other    6 month follow up. Meds reviewed by the pt. verbally. "doing well."       History of Present Illness: Wanda Nelson is a 86 y.o. female who presents for a follow-up visit post atrial myxoma resection. She has known history of anemia, hiatal hernia, hyperlipidemia and remote history of hemorrhagic stroke.  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  Cardiac catheterization before surgery showed no evidence of obstructive coronary artery disease.  She had intermittent palpitations and tachycardia shortly after surgery due to short runs of nonsustained ventricular tachycardia and intermittent A. fib with RVR. She did not require treatment for this and her symptoms resolved without intervention.  Echocardiogram in December 2020 showed normal LV systolic function with no significant valvular abnormalities.  There was a concern about possible recurrent atrial mass.  However, it appeared to be prominent scarring at the surgical site.  I repeated her echocardiogram in June and that showed no evidence of mass.  EF was 50 to 55%.  She has been doing very well with no recent chest pain, shortness of breath or palpitations.  She continues to walk 3 miles daily.  She is planning to travel to [place] in May and [place] after that.  Past Medical History:  Diagnosis Date  . Anemia   . Glaucoma   . History of blood transfusion   . History of hiatal hernia   . Hyperlipidemia   . Incidental pulmonary nodule, > 4mm and < 61mm 03/20/2018   Noted on CT scan - needs f/u imaging 6 months  . 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 Stroke [facility])     Past Surgical History:   Procedure Laterality Date  . ABDOMINAL HYSTERECTOMY  1982   total  . BUNIONECTOMY Bilateral   . CLIPPING OF ATRIAL APPENDAGE  03/21/2018   Procedure: Clipping Of Atrial Appendage - using an AtriCure 3mm clip;  Surgeon: Rexene Alberts, MD;  Location: [facility] OR;  Service: Open Heart Surgery;;  . EXCISION OF ATRIAL MYXOMA N/A 03/21/2018   Procedure: Excision Of Atrial Myxoma;  Surgeon: Rexene Alberts, MD;  Location: [place];  Service: Open Heart Surgery;  Laterality: N/A;  . RECTOCELE REPAIR    . RIGHT/LEFT HEART CATH AND CORONARY ANGIOGRAPHY N/A 03/19/2018   Procedure: RIGHT/LEFT HEART CATH AND CORONARY ANGIOGRAPHY;  Surgeon: Wellington Hampshire, MD;  Location: [address] CV LAB;  Service: Cardiovascular;  Laterality: N/A;  . TEE WITHOUT CARDIOVERSION N/A 03/21/2018   Procedure: TRANSESOPHAGEAL ECHOCARDIOGRAM (TEE);  Surgeon: Rexene Alberts, MD;  Location: [place];  Service: Open Heart Surgery;  Laterality: N/A;  . TONSILLECTOMY       Current Outpatient Medications  Medication Sig Dispense Refill  . aspirin EC 81 MG tablet Take 1 tablet (81 mg total) by mouth daily.    Marland Kitchen atorvastatin (LIPITOR) 20 MG tablet Take 1 tablet (20 mg total) by mouth daily at 6 PM. 90 tablet 3  . Calcium Carbonate-Vitamin D (CALCIUM 500 + D) 500-125 MG-UNIT TABS Take 1 tablet by mouth daily.    . cholecalciferol (VITAMIN D) 1000 units tablet Take 1,000 Units by mouth daily.    Marland Kitchen  ferrous sulfate (FEROSUL) 325 (65 FE) MG tablet Take 1 tablet (325 mg total) by mouth 2 (two) times daily with a meal. 180 tablet 3  . Flaxseed, Linseed, (FLAX SEED OIL) 1300 MG CAPS Take 1,100 mg by mouth daily.     . Multiple Vitamin (MULTIVITAMIN) tablet Take 1 tablet by mouth daily.    . Omega-3 Fatty Acids (FISH OIL) 1200 MG CAPS Take 2,400 mg by mouth 2 (two) times daily.    Marland Kitchen omeprazole (PRILOSEC) 20 MG capsule Take 1 capsule (20 mg total) by mouth daily. 90 capsule 3   No current facility-administered medications for this visit.     Allergies:   Bee venom, Codeine, Morphine and related, Shellfish allergy, Bactrim [sulfamethoxazole-trimethoprim], and Nitrofurantoin    Social History:  The patient  reports that she has never smoked. She has never used smokeless tobacco. She reports that she does not drink alcohol and does not use drugs.   Family History:  The patient's family history includes Colon cancer in her brother, maternal grandmother, and maternal uncle.    ROS:  Please see the history of present illness.   Otherwise, review of systems are positive for none.   All other systems are reviewed and negative.    PHYSICAL EXAM: VS:  BP (!) 160/78 (BP Location: Left Arm, Patient Position: Sitting, Cuff Size: Normal)   Pulse (!) 51   Ht 5\' 2"  (1.575 m)   Wt 119 lb (54 kg)   SpO2 98%   BMI 21.77 kg/m  , BMI Body mass index is 21.77 kg/m. GEN: Well nourished, well developed, in no acute distress  HEENT: normal  Neck: no JVD, carotid bruits, or masses Cardiac: RRR; no murmurs, rubs, or gallops,no edema  Respiratory:  clear to auscultation bilaterally, normal work of breathing GI: soft, nontender, nondistended, + BS MS: no deformity or atrophy  Skin: warm and dry, no rash Neuro:  Strength and sensation are intact Psych: euthymic mood, full affect   EKG:  EKG is ordered today. The ekg ordered today demonstrates sinus bradycardia with possible left atrial enlargement.   Recent Labs: 11/05/2019: ALT 20; BUN 14; Creatinine, Ser 0.88; Hemoglobin 15.1; Platelets 166.0; Potassium 3.9; Sodium 140; TSH 1.91    Lipid Panel    Component Value Date/Time   CHOL 168 11/05/2019 0832   TRIG 90.0 11/05/2019 0832   HDL 61.60 11/05/2019 0832   CHOLHDL 3 11/05/2019 0832   VLDL 18.0 11/05/2019 0832   LDLCALC 89 11/05/2019 0832      Wt Readings from Last 3 Encounters:  11/05/19 119 lb (54 kg)  07/02/19 121 lb 6.4 oz (55.1 kg)  06/24/19 119 lb 6.4 oz (54.2 kg)      No flowsheet data found.    ASSESSMENT  AND PLAN:  1.  Status post surgical resection of atrial myxoma: She is doing extremely well.  No evidence of recurrent myxoma on most recent echocardiogram.    2.  Postoperative atrial fibrillation: She reports no further palpitations or tachycardia.  She did not require any medication for this.  3.  Hyperlipidemia: Currently on atorvastatin.  I reviewed recent labs which showed LDL below 100.  4.  Elevated blood pressure without history of hypertension: Her blood pressure is usually on the low side.  Continue to monitor for now.    Disposition:   FU with me in 6 months  Signed,  [name], MD  11/05/2019 11:45 AM    [place]

## 2019-11-05 NOTE — Patient Instructions (Signed)
Medication Instructions:  Your physician recommends that you continue on your current medications as directed. Please refer to the Current Medication list given to you today.  *If you need a refill on your cardiac medications before your next appointment, please call your pharmacy*   Lab Work: None ordered If you have labs (blood work) drawn today and your tests are completely normal, you will receive your results only by: MyChart Message (if you have MyChart) OR A paper copy in the mail If you have any lab test that is abnormal or we need to change your treatment, we will call you to review the results.   Testing/Procedures: None ordered   Follow-Up: At [facility], you and your health needs are our priority.  As part of our continuing mission to provide you with exceptional heart care, we have created designated Provider Care Teams.  These Care Teams include your primary Cardiologist (physician) and Advanced Practice Providers (APPs -  Physician Assistants and Nurse Practitioners) who all work together to provide you with the care you need, when you need it.  We recommend signing up for the patient portal called "MyChart".  Sign up information is provided on this After Visit Summary.  MyChart is used to connect with patients for Virtual Visits (Telemedicine).  Patients are able to view lab/test results, encounter notes, upcoming appointments, etc.  Non-urgent messages can be sent to your provider as well.   To learn more about what you can do with MyChart, go to [email or web address].com.    Your next appointment:   Your physician wants you to follow-up in: 6 months You will receive a reminder letter in the mail two months in advance. If you don't receive a letter, please call our office to schedule the follow-up appointment.   The format for your next appointment:   In Person  Provider:   You may see Muhammad Arida, MD or one of the following Advanced Practice Providers on your  designated Care Team:   Christopher Berge, NP Ryan Dunn, PA-C Jacquelyn Visser, PA-C Cadence Furth, PA-C   Other Instructions N/A  

## 2019-11-06 ENCOUNTER — Other Ambulatory Visit: Payer: Self-pay | Admitting: Internal Medicine

## 2019-11-06 DIAGNOSIS — D509 Iron deficiency anemia, unspecified: Secondary | ICD-10-CM

## 2019-11-06 DIAGNOSIS — K219 Gastro-esophageal reflux disease without esophagitis: Secondary | ICD-10-CM

## 2019-11-06 DIAGNOSIS — E785 Hyperlipidemia, unspecified: Secondary | ICD-10-CM

## 2019-11-06 LAB — URINALYSIS, ROUTINE W REFLEX MICROSCOPIC
Bilirubin Urine: NEGATIVE
Glucose, UA: NEGATIVE
Hyaline Cast: NONE SEEN /LPF
Ketones, ur: NEGATIVE
Nitrite: POSITIVE — AB
Specific Gravity, Urine: 1.012 (ref 1.001–1.03)
Squamous Epithelial / HPF: NONE SEEN /HPF (ref <=5)
WBC, UA: >=60 /HPF — AB (ref 0–5)
pH: 6.5 (ref 5.0–8.0)

## 2019-11-06 LAB — IRON,TIBC AND FERRITIN PANEL
%SAT: 26 % (calc) (ref 16–45)
Ferritin: 88 ng/mL (ref 16–288)
Iron: 84 ug/dL (ref 45–160)
TIBC: 323 mcg/dL (calc) (ref 250–450)

## 2019-11-06 LAB — URINE CULTURE

## 2019-11-06 MED ORDER — OMEPRAZOLE 20 MG PO CPDR: 20.0000 mg | DELAYED_RELEASE_CAPSULE | Freq: Every day | ORAL | 3 refills | Status: DC

## 2019-11-06 MED ORDER — ASPIRIN EC 81 MG PO TBEC: 81.0000 mg | DELAYED_RELEASE_TABLET | Freq: Every day | ORAL | 3 refills | Status: DC

## 2019-11-06 MED ORDER — ATORVASTATIN CALCIUM 20 MG PO TABS: 20.0000 mg | ORAL_TABLET | Freq: Every day | ORAL | 3 refills | Status: DC

## 2019-11-06 MED ORDER — FERROUS SULFATE 325 (65 FE) MG PO TABS: 325.0000 mg | ORAL_TABLET | Freq: Every day | ORAL | 3 refills | Status: DC

## 2019-11-11 ENCOUNTER — Other Ambulatory Visit: Payer: Self-pay

## 2019-11-11 ENCOUNTER — Ambulatory Visit (INDEPENDENT_AMBULATORY_CARE_PROVIDER_SITE_OTHER): Payer: Medicare Other | Admitting: Dermatology

## 2019-11-11 ENCOUNTER — Encounter: Payer: Self-pay | Admitting: Dermatology

## 2019-11-11 DIAGNOSIS — C44629 Squamous cell carcinoma of skin of left upper limb, including shoulder: Secondary | ICD-10-CM | POA: Diagnosis not present

## 2019-11-11 DIAGNOSIS — D492 Neoplasm of unspecified behavior of bone, soft tissue, and skin: Secondary | ICD-10-CM

## 2019-11-11 DIAGNOSIS — L821 Other seborrheic keratosis: Secondary | ICD-10-CM

## 2019-11-11 DIAGNOSIS — L820 Inflamed seborrheic keratosis: Secondary | ICD-10-CM | POA: Diagnosis not present

## 2019-11-11 NOTE — Patient Instructions (Signed)

## 2019-11-11 NOTE — Progress Notes (Signed)
Follow-Up Visit   Subjective  Wanda Nelson is a 86 y.o. female who presents for the following: check spot (L arm, new) and check spot (back, comes and goes, itchy).  She has several other areas to be evaluated.  The following portions of the chart were reviewed this encounter and updated as appropriate:  Tobacco  Allergies  Meds  Problems  Med Hx  Surg Hx  Fam Hx     Review of Systems:  No other skin or systemic complaints except as noted in HPI or Assessment and Plan.  Objective  Well appearing patient in no apparent distress; mood and affect are within normal limits.  A focused examination was performed including back, L arm. Relevant physical exam findings are noted in the Assessment and Plan.  Objective  Left distal dorsum forearm: 1.1 cm hyperkeratotic papule  Objective  Back (17): Erythematous keratotic or waxy stuck-on papule or plaque.    Assessment & Plan    Actinic Damage - chronic, secondary to cumulative UV radiation exposure/sun exposure over time - diffuse scaly erythematous macules with underlying dyspigmentation - Recommend daily broad spectrum sunscreen SPF 30+ to sun-exposed areas, reapply every 2 hours as needed.  - Call for new or changing lesions.  Neoplasm of skin Left distal dorsum forearm  Epidermal / dermal shaving  Lesion diameter (cm):  1.1 Informed consent: discussed and consent obtained   Timeout: patient name, date of birth, surgical site, and procedure verified   Procedure prep:  Patient was prepped and draped in usual sterile fashion Prep type:  Isopropyl alcohol Anesthesia: the lesion was anesthetized in a standard fashion   Anesthetic:  1% lidocaine w/ epinephrine 1-100,000 buffered w/ 8.4% NaHCO3 Instrument used: flexible razor blade   Hemostasis achieved with: pressure, aluminum chloride and electrodesiccation   Outcome: patient tolerated procedure well   Post-procedure details: sterile dressing applied and wound  care instructions given   Dressing type: bandage and petrolatum    Destruction of lesion Complexity: extensive   Destruction method: electrodesiccation and curettage   Informed consent: discussed and consent obtained   Timeout:  patient name, date of birth, surgical site, and procedure verified Procedure prep:  Patient was prepped and draped in usual sterile fashion Prep type:  Isopropyl alcohol Anesthesia: the lesion was anesthetized in a standard fashion   Anesthetic:  1% lidocaine w/ epinephrine 1-100,000 buffered w/ 8.4% NaHCO3 Curettage performed in three different directions: Yes   Electrodesiccation performed over the curetted area: Yes   Lesion length (cm):  1.1 Lesion width (cm):  1.1 Margin per side (cm):  0.2 Final wound size (cm):  1.5 Hemostasis achieved with:  pressure, aluminum chloride and electrodesiccation Outcome: patient tolerated procedure well with no complications   Post-procedure details: sterile dressing applied and wound care instructions given   Dressing type: bandage and petrolatum    Specimen 1 - Surgical pathology Differential Diagnosis: SCC vs other  Check Margins: No 1.1 cm hyperkeratotic papule EDC today  Inflamed seborrheic keratosis (17) Back  Destruction of lesion - Back Complexity: simple   Destruction method: cryotherapy   Informed consent: discussed and consent obtained   Timeout:  patient name, date of birth, surgical site, and procedure verified Lesion destroyed using liquid nitrogen: Yes   Region frozen until ice ball extended beyond lesion: Yes   Outcome: patient tolerated procedure well with no complications   Post-procedure details: wound care instructions given    Seborrheic Keratoses - Stuck-on, waxy, tan-brown papules and plaques  -  Discussed benign etiology and prognosis. - Observe - Call for any changes  Return in about 3 months (around 02/11/2020) for ISK follow up and biopsy follow up.  I, Othelia Pulling, RMA, am acting  as scribe for Sarina Ser, MD .  Documentation: I have reviewed the above documentation for accuracy and completeness, and I agree with the above.  Sarina Ser, MD

## 2019-11-12 ENCOUNTER — Other Ambulatory Visit: Payer: Self-pay

## 2019-11-12 ENCOUNTER — Encounter: Payer: Self-pay | Admitting: Internal Medicine

## 2019-11-12 DIAGNOSIS — R300 Dysuria: Secondary | ICD-10-CM

## 2019-11-12 DIAGNOSIS — C4492 Squamous cell carcinoma of skin, unspecified: Secondary | ICD-10-CM

## 2019-11-12 HISTORY — DX: Squamous cell carcinoma of skin, unspecified: C44.92

## 2019-11-12 NOTE — Progress Notes (Signed)
Pt called & is still experiencing UTI sx. She sis scheduled tomorrow to leave a urine.

## 2019-11-13 ENCOUNTER — Other Ambulatory Visit: Payer: Self-pay

## 2019-11-13 ENCOUNTER — Other Ambulatory Visit: Payer: Medicare Other

## 2019-11-13 DIAGNOSIS — H903 Sensorineural hearing loss, bilateral: Secondary | ICD-10-CM | POA: Diagnosis not present

## 2019-11-13 DIAGNOSIS — R300 Dysuria: Secondary | ICD-10-CM

## 2019-11-13 DIAGNOSIS — H6062 Unspecified chronic otitis externa, left ear: Secondary | ICD-10-CM | POA: Diagnosis not present

## 2019-11-13 DIAGNOSIS — H6122 Impacted cerumen, left ear: Secondary | ICD-10-CM | POA: Diagnosis not present

## 2019-11-15 LAB — URINE CULTURE
MICRO NUMBER:: 11166451
SPECIMEN QUALITY:: ADEQUATE

## 2019-11-16 ENCOUNTER — Telehealth: Payer: Self-pay

## 2019-11-16 ENCOUNTER — Telehealth: Payer: Self-pay | Admitting: Internal Medicine

## 2019-11-16 ENCOUNTER — Other Ambulatory Visit: Payer: Self-pay | Admitting: Internal Medicine

## 2019-11-16 DIAGNOSIS — N3000 Acute cystitis without hematuria: Secondary | ICD-10-CM

## 2019-11-16 MED ORDER — CIPROFLOXACIN HCL 500 MG PO TABS: 500.0000 mg | ORAL_TABLET | Freq: Two times a day (BID) | ORAL | 0 refills | Status: DC

## 2019-11-16 NOTE — Telephone Encounter (Addendum)
UTI with symptoms since 11/04/19, recurrent Klebsiella not E coli with sx dysuria  Rx Cipro 500 mg bid x 3-5 days  Time 5-10 minutes service fee for antibiotic Rx   Inform pt   Dr. Olivia Mackie McLean-Scocuzza

## 2019-11-16 NOTE — Telephone Encounter (Signed)
LM on VM to return my call. 

## 2019-11-16 NOTE — Telephone Encounter (Signed)
Pt returned your call about results °

## 2019-11-16 NOTE — Telephone Encounter (Signed)
Tried to reach patient by phone no answer and no voicemail. 

## 2019-11-16 NOTE — Telephone Encounter (Signed)
-----   Message from Ralene Bathe, MD sent at 11/12/2019  5:30 PM EDT ----- Diagnosis Skin , left distal dorsum forearm WELL DIFFERENTIATED SQUAMOUS CELL CARCINOMA  Cancer - SCC Already treated Recheck next visit

## 2019-11-17 ENCOUNTER — Ambulatory Visit: Payer: Medicare Other | Admitting: Internal Medicine

## 2019-11-19 ENCOUNTER — Telehealth: Payer: Self-pay

## 2019-11-19 NOTE — Telephone Encounter (Signed)
-----   Message from Ralene Bathe, MD sent at 11/12/2019  5:30 PM EDT ----- Diagnosis Skin , left distal dorsum forearm WELL DIFFERENTIATED SQUAMOUS CELL CARCINOMA  Cancer - SCC Already treated Recheck next visit

## 2019-11-19 NOTE — Telephone Encounter (Signed)
Left message for patient to call office/hd 

## 2019-11-20 NOTE — Telephone Encounter (Addendum)
From result note:   Thressa Sheller, [facility]  11/17/2019  3:00 PM EST Back to Top    Patient informed and verbalized understanding.    She has the antibiotic.    UTI outside of visit with sx since 11/04/19 recurrent UTI  A/P  Rx cipro 500 mg bid x 3-5 days  Time spent 5-10 minutes telephone fee  Informed pt dx and tx plan, agreeable   Dr. Olivia Mackie McLean-Scocuzza

## 2019-11-23 ENCOUNTER — Telehealth: Payer: Self-pay

## 2019-11-23 NOTE — Telephone Encounter (Signed)
-----   Message from Ralene Bathe, MD sent at 11/12/2019  5:30 PM EDT ----- Diagnosis Skin , left distal dorsum forearm WELL DIFFERENTIATED SQUAMOUS CELL CARCINOMA  Cancer - SCC Already treated Recheck next visit

## 2019-11-23 NOTE — Telephone Encounter (Signed)
Patient advised of biopsy results.

## 2019-11-23 NOTE — Telephone Encounter (Signed)
Left message for patient to return my call.

## 2019-11-24 DIAGNOSIS — Z23 Encounter for immunization: Secondary | ICD-10-CM | POA: Diagnosis not present

## 2019-11-26 ENCOUNTER — Encounter: Payer: Self-pay | Admitting: Internal Medicine

## 2019-11-26 ENCOUNTER — Ambulatory Visit (INDEPENDENT_AMBULATORY_CARE_PROVIDER_SITE_OTHER): Payer: Medicare Other

## 2019-11-26 ENCOUNTER — Ambulatory Visit (INDEPENDENT_AMBULATORY_CARE_PROVIDER_SITE_OTHER): Payer: Medicare Other | Admitting: Internal Medicine

## 2019-11-26 ENCOUNTER — Other Ambulatory Visit: Payer: Self-pay

## 2019-11-26 VITALS — BP 136/80 | HR 76 | Temp 98.0°F | Ht 62.0 in | Wt 121.0 lb

## 2019-11-26 DIAGNOSIS — S50812A Abrasion of left forearm, initial encounter: Secondary | ICD-10-CM | POA: Diagnosis not present

## 2019-11-26 DIAGNOSIS — R1012 Left upper quadrant pain: Secondary | ICD-10-CM | POA: Diagnosis not present

## 2019-11-26 DIAGNOSIS — T148XXA Other injury of unspecified body region, initial encounter: Secondary | ICD-10-CM

## 2019-11-26 DIAGNOSIS — Z1231 Encounter for screening mammogram for malignant neoplasm of breast: Secondary | ICD-10-CM

## 2019-11-26 DIAGNOSIS — N390 Urinary tract infection, site not specified: Secondary | ICD-10-CM

## 2019-11-26 DIAGNOSIS — J40 Bronchitis, not specified as acute or chronic: Secondary | ICD-10-CM

## 2019-11-26 DIAGNOSIS — R109 Unspecified abdominal pain: Secondary | ICD-10-CM | POA: Diagnosis not present

## 2019-11-26 MED ORDER — AZITHROMYCIN 250 MG PO TABS: ORAL_TABLET | ORAL | 0 refills | Status: DC

## 2019-11-26 MED ORDER — MUPIROCIN 2 % EX OINT: 1.0000 "application " | TOPICAL_OINTMENT | Freq: Two times a day (BID) | CUTANEOUS | 0 refills | Status: DC

## 2019-11-26 MED ORDER — CIPROFLOXACIN HCL 250 MG PO TABS: 250.0000 mg | ORAL_TABLET | Freq: Two times a day (BID) | ORAL | 0 refills | Status: DC

## 2019-11-26 NOTE — Patient Instructions (Addendum)
Cranberry with D mannose  AZO for UTI with pyridium helps burning with UTI   gax X chewable   US abdomen will call you  Please call for mammogram scheduling   Urinary Tract Infection, Adult  A urinary tract infection (UTI) is an infection of any part of the urinary tract. The urinary tract includes the kidneys, ureters, bladder, and urethra. These organs make, store, and get rid of urine in the body. Your health care provider may use other names to describe the infection. An upper UTI affects the ureters and kidneys (pyelonephritis). A lower UTI affects the bladder (cystitis) and urethra (urethritis). What are the causes? Most urinary tract infections are caused by bacteria in your genital area, around the entrance to your urinary tract (urethra). These bacteria grow and cause inflammation of your urinary tract. What increases the risk? You are more likely to develop this condition if:  You have a urinary catheter that stays in place (indwelling).  You are not able to control when you urinate or have a bowel movement (you have incontinence).  You are female and you: ? Use a spermicide or diaphragm for birth control. ? Have low estrogen levels. ? Are pregnant.  You have certain genes that increase your risk (genetics).  You are sexually active.  You take antibiotic medicines.  You have a condition that causes your flow of urine to slow down, such as: ? An enlarged prostate, if you are female. ? Blockage in your urethra (stricture). ? A kidney stone. ? A nerve condition that affects your bladder control (neurogenic bladder). ? Not getting enough to drink, or not urinating often.  You have certain medical conditions, such as: ? Diabetes. ? A weak disease-fighting system (immunesystem). ? Sickle cell disease. ? Gout. ? Spinal cord injury. What are the signs or symptoms? Symptoms of this condition include:  Needing to urinate right away (urgently).  Frequent urination or  passing small amounts of urine frequently.  Pain or burning with urination.  Blood in the urine.  Urine that smells bad or unusual.  Trouble urinating.  Cloudy urine.  Vaginal discharge, if you are female.  Pain in the abdomen or the lower back. You may also have:  Vomiting or a decreased appetite.  Confusion.  Irritability or tiredness.  A fever.  Diarrhea. The first symptom in older adults may be confusion. In some cases, they may not have any symptoms until the infection has worsened. How is this diagnosed? This condition is diagnosed based on your medical history and a physical exam. You may also have other tests, including:  Urine tests.  Blood tests.  Tests for sexually transmitted infections (STIs). If you have had more than one UTI, a cystoscopy or imaging studies may be done to determine the cause of the infections. How is this treated? Treatment for this condition includes:  Antibiotic medicine.  Over-the-counter medicines to treat discomfort.  Drinking enough water to stay hydrated. If you have frequent infections or have other conditions such as a kidney stone, you may need to see a health care provider who specializes in the urinary tract (urologist). In rare cases, urinary tract infections can cause sepsis. Sepsis is a life-threatening condition that occurs when the body responds to an infection. Sepsis is treated in the hospital with IV antibiotics, fluids, and other medicines. Follow these instructions at home:  Medicines  Take over-the-counter and prescription medicines only as told by your health care provider.  If you were prescribed an antibiotic medicine,  take it as told by your health care provider. Do not stop using the antibiotic even if you start to feel better. General instructions  Make sure you: ? Empty your bladder often and completely. Do not hold urine for long periods of time. ? Empty your bladder after sex. ? Wipe from front  to back after a bowel movement if you are female. Use each tissue one time when you wipe.  Drink enough fluid to keep your urine pale yellow.  Keep all follow-up visits as told by your health care provider. This is important. Contact a health care provider if:  Your symptoms do not get better after 1-2 days.  Your symptoms go away and then return. Get help right away if you have:  Severe pain in your back or your lower abdomen.  A fever.  Nausea or vomiting. Summary  A urinary tract infection (UTI) is an infection of any part of the urinary tract, which includes the kidneys, ureters, bladder, and urethra.  Most urinary tract infections are caused by bacteria in your genital area, around the entrance to your urinary tract (urethra).  Treatment for this condition often includes antibiotic medicines.  If you were prescribed an antibiotic medicine, take it as told by your health care provider. Do not stop using the antibiotic even if you start to feel better.  Keep all follow-up visits as told by your health care provider. This is important. This information is not intended to replace advice given to you by your health care provider. Make sure you discuss any questions you have with your health care provider. Document Revised: 12/12/2017 Document Reviewed: 07/04/2017 Elsevier Patient Education  2020 [employer].

## 2019-11-26 NOTE — Progress Notes (Signed)
Chief Complaint  Patient presents with  . Follow-up   F/u doing well  1. S/p uti with tx cipro 500 mg bid x 3-5 days took 1 pill and helped  2. C/o LUQ ab pain mild to moderate and intermittent nothing tried and feels like gas with h/o large hiatal hernia drinking 45-50 oz water and no constipation  3 .traveling 05/2020 and 08/2020 and wants Zpack and Cipro to use prn if she gets sick  4. Left forearm abrasion s/p LN2 Rx bactroban   Review of Systems  Constitutional: Negative for weight loss.  HENT: Negative for hearing loss.   Eyes: Negative for blurred vision.  Respiratory: Negative for shortness of breath.   Cardiovascular: Negative for chest pain.  Gastrointestinal: Positive for abdominal pain.  Musculoskeletal: Negative for falls.  Skin: Negative for rash.  Neurological: Negative for headaches.  Psychiatric/Behavioral: Negative for depression.   Past Medical History:  Diagnosis Date  . Actinic keratosis   . Anemia   . Glaucoma   . History of blood transfusion   . History of hiatal hernia   . Hyperlipidemia   . Incidental pulmonary nodule, > 29mm and < 20mm 03/20/2018   Noted on CT scan - needs f/u imaging 6 months  . 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 Squamous cell carcinoma of skin 11/12/2019   L distal dorsum forearm - ED&C   . Stroke [facility])    Past Surgical History:  Procedure Laterality Date  . ABDOMINAL HYSTERECTOMY  1982   total  . BUNIONECTOMY Bilateral   . CLIPPING OF ATRIAL APPENDAGE  03/21/2018   Procedure: Clipping Of Atrial Appendage - using an AtriCure 77mm clip;  Surgeon: Rexene Alberts, MD;  Location: [facility] OR;  Service: Open Heart Surgery;;  . EXCISION OF ATRIAL MYXOMA N/A 03/21/2018   Procedure: Excision Of Atrial Myxoma;  Surgeon: Rexene Alberts, MD;  Location: [place];  Service: Open Heart Surgery;  Laterality: N/A;  . RECTOCELE REPAIR    . RIGHT/LEFT HEART CATH AND CORONARY ANGIOGRAPHY N/A 03/19/2018   Procedure: RIGHT/LEFT HEART CATH AND  CORONARY ANGIOGRAPHY;  Surgeon: Wellington Hampshire, MD;  Location: [place] [facility];  Service: Cardiovascular;  Laterality: N/A;  . TEE WITHOUT CARDIOVERSION N/A 03/21/2018   Procedure: TRANSESOPHAGEAL ECHOCARDIOGRAM (TEE);  Surgeon: Rexene Alberts, MD;  Location: [place];  Service: Open Heart Surgery;  Laterality: N/A;  . TONSILLECTOMY     Family History  Problem Relation Age of Onset  . Colon cancer Brother   . Colon cancer Maternal Uncle   . Colon cancer Maternal Grandmother   . Breast cancer Neg Hx    Social History   Socioeconomic History  . Marital status: Widowed    Spouse name: Not on file  . Number of children: Not on file  . Years of education: Not on file  . Highest education level: Not on file  Occupational History  . Not on file  Tobacco Use  . Smoking status: Never Smoker  . Smokeless tobacco: Never Used  Vaping Use  . Vaping Use: Never used  Substance and Sexual Activity  . Alcohol use: No    Alcohol/week: 0.0 standard drinks  . Drug use: No  . Sexual activity: Not Currently  Other Topics Concern  . Not on file  Social History Narrative   Lives [place],  husband died 12-16-18 due to brain hemorrhage   Daughter in law Linetta Regner    widowed   Social Determinants of  Health   Financial Resource Strain:   . Difficulty of Paying Living Expenses: Not on file  Food Insecurity:   . Worried About Charity fundraiser in the Last Year: Not on file  . Ran Out of Food in the Last Year: Not on file  Transportation Needs:   . Lack of Transportation (Medical): Not on file  . Lack of Transportation (Non-Medical): Not on file  Physical Activity:   . Days of Exercise per Week: Not on file  . Minutes of Exercise per Session: Not on file  Stress:   . Feeling of Stress : Not on file  Social Connections:   . Frequency of Communication with Friends and Family: Not on file  . Frequency of Social Gatherings with Friends and Family: Not on file  . Attends Religious  Services: Not on file  . Active Member of Clubs or Organizations: Not on file  . Attends Archivist Meetings: Not on file  . Marital Status: Not on file  Intimate Partner Violence:   . Fear of Current or Ex-Partner: Not on file  . Emotionally Abused: Not on file  . Physically Abused: Not on file  . Sexually Abused: Not on file   Current Meds  Medication Sig  . Ascorbic Acid (VITAMIN C PO) Take by mouth.  Marland Kitchen aspirin EC 81 MG tablet Take 1 tablet (81 mg total) by mouth daily.  Marland Kitchen atorvastatin (LIPITOR) 20 MG tablet Take 1 tablet (20 mg total) by mouth daily at 6 PM.  . Calcium Carbonate-Vitamin D (CALCIUM 500 + D) 500-125 MG-UNIT TABS Take 1 tablet by mouth daily.  . cholecalciferol (VITAMIN D) 1000 units tablet Take 1,000 Units by mouth daily.  . ferrous sulfate (FEROSUL) 325 (65 FE) MG tablet Take 1 tablet (325 mg total) by mouth daily with breakfast. With vitamin C 500 mg daily  . Flaxseed, Linseed, (FLAX SEED OIL) 1300 MG CAPS Take 1,100 mg by mouth daily.   . Multiple Vitamin (MULTIVITAMIN) tablet Take 1 tablet by mouth daily.  . Omega-3 Fatty Acids (FISH OIL) 1200 MG CAPS Take 2,400 mg by mouth 2 (two) times daily.  Marland Kitchen omeprazole (PRILOSEC) 20 MG capsule Take 1 capsule (20 mg total) by mouth daily. 30 minutes before breakfast prn   Allergies  Allergen Reactions  . Bee Venom Anaphylaxis and Hives  . Codeine Other (See Comments)    Altered mental status Altered mental status  . Morphine And Related     Altered mental status  . Shellfish Allergy Anaphylaxis and Hives    Mainly shrimp  . Bactrim [Sulfamethoxazole-Trimethoprim]     ? Reaction    . Nitrofurantoin Rash   Recent Results (from the past 2160 hour(s))  Iron, TIBC and Ferritin Panel     Status: None   Collection Time: 11/05/19  8:32 AM  Result Value Ref Range   Iron 84 45 - 160 mcg/dL   TIBC 323 250 - 450 mcg/dL (calc)   %SAT 26 16 - 45 % (calc)   Ferritin 88 16 - 288 ng/mL  Urine Culture     Status: None    Collection Time: 11/05/19  8:32 AM   Specimen: Urine  Result Value Ref Range   MICRO NUMBER: CANCELED     Comment: Result canceled by the ancillary.   SPECIMEN QUALITY: CANCELED     Comment: Result canceled by the ancillary.   Sample Source CANCELED     Comment: Result canceled by the ancillary.   STATUS:  CANCELED     Comment: Result canceled by the ancillary.   Result: CANCELED     Comment: Result canceled by the ancillary.  TSH     Status: None   Collection Time: 11/05/19  8:32 AM  Result Value Ref Range   TSH 1.91 0.35 - 4.50 uIU/mL  CBC with Differential/Platelet     Status: Abnormal   Collection Time: 11/05/19  8:32 AM  Result Value Ref Range   WBC 7.1 4.0 - 10.5 K/uL   RBC 4.82 3.87 - 5.11 Mil/uL   Hemoglobin 15.1 (H) 12.0 - 15.0 g/dL   HCT 45.3 36 - 46 %   MCV 94.1 78.0 - 100.0 fl   MCHC 33.3 30.0 - 36.0 g/dL   RDW 12.9 11.5 - 15.5 %   Platelets 166.0 150 - 400 K/uL   Neutrophils Relative % 65.7 43 - 77 %   Lymphocytes Relative 22.0 12 - 46 %   Monocytes Relative 7.2 3 - 12 %   Eosinophils Relative 4.3 0 - 5 %   Basophils Relative 0.8 0 - 3 %   Neutro Abs 4.7 1.4 - 7.7 K/uL   Lymphs Abs 1.6 0.7 - 4.0 K/uL   Monocytes Absolute 0.5 0.1 - 1.0 K/uL   Eosinophils Absolute 0.3 0.0 - 0.7 K/uL   Basophils Absolute 0.1 0.0 - 0.1 K/uL  Comprehensive metabolic panel     Status: Abnormal   Collection Time: 11/05/19  8:32 AM  Result Value Ref Range   Sodium 140 135 - 145 mEq/L   Potassium 3.9 3.5 - 5.1 mEq/L   Chloride 105 96 - 112 mEq/L   CO2 28 19 - 32 mEq/L   Glucose, Bld 76 70 - 99 mg/dL   BUN 14 6 - 23 mg/dL   Creatinine, Ser 0.88 0.40 - 1.20 mg/dL   Total Bilirubin 0.8 0.2 - 1.2 mg/dL   Alkaline Phosphatase 87 39 - 117 U/L   AST 22 0 - 37 U/L   ALT 20 0 - 35 U/L   Total Protein 6.8 6.0 - 8.3 g/dL   Albumin 4.1 3.5 - 5.2 g/dL   GFR 59.42 (L) >60.00 mL/min    Comment: Calculated using the CKD-EPI Creatinine Equation (2021)   Calcium 9.2 8.4 - 10.5 mg/dL   Lipid panel     Status: None   Collection Time: 11/05/19  8:32 AM  Result Value Ref Range   Cholesterol 168 0 - 200 mg/dL    Comment: ATP III Classification       Desirable:  < 200 mg/dL               Borderline High:  200 - 239 mg/dL          High:  > = 240 mg/dL   Triglycerides 90.0 0 - 149 mg/dL    Comment: Normal:  <150 mg/dLBorderline High:  150 - 199 mg/dL   HDL 61.60 >39.00 mg/dL   VLDL 18.0 0.0 - 40.0 mg/dL   LDL Cholesterol 89 0 - 99 mg/dL   Total CHOL/HDL Ratio 3     Comment:                Men          Women1/2 Average Risk     3.4          3.3Average Risk          5.0          4.42X Average Risk  9.6          7.13X Average Risk          15.0          11.0                       NonHDL 106.59     Comment: NOTE:  Non-HDL goal should be 30 mg/dL higher than patient's LDL goal (i.e. LDL goal of < 70 mg/dL, would have non-HDL goal of < 100 mg/dL)  Urinalysis, Routine w reflex microscopic     Status: Abnormal   Collection Time: 11/05/19  8:32 AM  Result Value Ref Range   Color, Urine YELLOW YELLOW   APPearance CLOUDY (A) CLEAR   Specific Gravity, Urine 1.012 1.001 - 1.03   pH 6.5 5.0 - 8.0   Glucose, UA NEGATIVE NEGATIVE   Bilirubin Urine NEGATIVE NEGATIVE   Ketones, ur NEGATIVE NEGATIVE   Hgb urine dipstick 1+ (A) NEGATIVE   Protein, ur TRACE (A) NEGATIVE   Nitrite POSITIVE (A) NEGATIVE   Leukocytes,Ua 3+ (A) NEGATIVE   WBC, UA > OR = 60 (A) 0 - 5 /HPF   RBC / HPF 0-2 0 - 2 /HPF   Squamous Epithelial / LPF NONE SEEN < OR = 5 /HPF   Bacteria, UA MANY (A) NONE SEEN /HPF   Hyaline Cast NONE SEEN NONE SEEN /LPF  Urine Culture     Status: Abnormal   Collection Time: 11/13/19 10:31 AM   Specimen: Urine  Result Value Ref Range   MICRO NUMBER: [id number]    SPECIMEN QUALITY: Adequate    Sample Source NOT GIVEN    STATUS: FINAL    ISOLATE 1: Klebsiella oxytoca (A)     Comment: Greater than 100,000 CFU/mL of Klebsiella oxytoca      Susceptibility   Klebsiella oxytoca -  URINE CULTURE, REFLEX    AMOX/CLAVULANIC <=2 Sensitive     AMPICILLIN 16 Resistant     AMPICILLIN/SULBACTAM 4 Sensitive     CEFAZOLIN* 16 Resistant      * For uncomplicated UTI caused by E. coli,K. pneumoniae or P. mirabilis: Cefazolin issusceptible if MIC <32 mcg/mL and predictssusceptible to the oral agents cefaclor, cefdinir,cefpodoxime, cefprozil, cefuroxime, cephalexinand loracarbef.    CEFEPIME <=1 Sensitive     CEFTRIAXONE <=1 Sensitive     CIPROFLOXACIN <=0.25 Sensitive     LEVOFLOXACIN <=0.12 Sensitive     ERTAPENEM <=0.5 Sensitive     GENTAMICIN <=1 Sensitive     IMIPENEM <=0.25 Sensitive     NITROFURANTOIN <=16 Sensitive     PIP/TAZO <=4 Sensitive     TOBRAMYCIN <=1 Sensitive     TRIMETH/SULFA* <=20 Sensitive      * For uncomplicated UTI caused by E. coli,K. pneumoniae or P. mirabilis: Cefazolin issusceptible if MIC <32 mcg/mL and predictssusceptible to the oral agents cefaclor, cefdinir,cefpodoxime, cefprozil, cefuroxime, cephalexinand loracarbef.Legend:S = Susceptible  I = IntermediateR = Resistant  NS = Not susceptible* = Not tested  NR = Not reported**NN = See antimicrobic comments   Objective  Body mass index is 22.13 kg/m. Wt Readings from Last 3 Encounters:  11/26/19 121 lb (54.9 kg)  11/05/19 119 lb (54 kg)  07/02/19 121 lb 6.4 oz (55.1 kg)   Temp Readings from Last 3 Encounters:  11/26/19 98 F (36.7 C) (Oral)  07/02/19 97.9 F (36.6 C) (Temporal)  06/24/19 98.3 F (36.8 C) (Temporal)   BP Readings from Last 3 Encounters:  11/26/19 136/80  11/05/19 (!) 160/78  07/02/19 140/70   Pulse Readings from Last 3 Encounters:  11/26/19 76  11/05/19 (!) 51  07/02/19 (!) 58    Physical Exam Vitals and nursing note reviewed.  Constitutional:      Appearance: Normal appearance. She is well-developed and well-groomed.  HENT:     Head: Normocephalic and atraumatic.  Eyes:     Conjunctiva/sclera: Conjunctivae normal.     Pupils: Pupils are equal, round, and  reactive to light.  Cardiovascular:     Rate and Rhythm: Normal rate and regular rhythm.     Heart sounds: Normal heart sounds. No murmur heard.   Pulmonary:     Effort: Pulmonary effort is normal.     Breath sounds: Normal breath sounds.  Abdominal:     General: Abdomen is flat. Bowel sounds are normal.     Tenderness: There is no abdominal tenderness.  Skin:    General: Skin is warm and dry.  Neurological:     General: No focal deficit present.     Mental Status: She is alert and oriented to person, place, and time. Mental status is at baseline.     Gait: Gait normal.  Psychiatric:        Attention and Perception: Attention and perception normal.        Mood and Affect: Mood and affect normal.        Speech: Speech normal.        Behavior: Behavior normal. Behavior is cooperative.        Thought Content: Thought content normal.        Cognition and Memory: Cognition and memory normal.        Judgment: Judgment normal.     Assessment  Plan  Recurrent UTI - Plan: ciprofloxacin (CIPRO) 250 MG tablet bid for future  Tx'ed recently and resolved  Bronchitis - Plan: azithromycin (ZITHROMAX) 250 MG tablet for prn in future  LUQ abdominal pain - Plan: DG Abd 1 View, US Abdomen Complete   Abrasion s/p LN2 derm - Plan: mupirocin ointment (BACTROBAN) 2 %   HM Flu shotutd Tdap consider in future  shingrix 2/2 covid 19 3/3 moderna  [id number]/162016 prevnar [facility] pna 23? If hadno record from [place] will need in future  Out of age window pap Mammogram had in 2016 last consider b/l dx mammo and [place] will disc with pt 09/2018 right breast red and sore since heart surgery 03/2018 12/02/18 negative mammo and [place] Referred today 11/2019  Colonoscopy/EGD in 2014 [place] colonoscopy normal EGD large H/H per pt  DEXA 2015/2016 [facility] normal per pt  Skin- Dr. Nehemiah Massed appt sch 11/2019 per pt seen and UTD  Provider: Dr. Olivia Mackie  [facility]

## 2019-12-11 ENCOUNTER — Other Ambulatory Visit: Payer: Self-pay

## 2019-12-11 ENCOUNTER — Ambulatory Visit
Admission: RE | Admit: 2019-12-11 | Discharge: 2019-12-11 | Disposition: A | Discharge status: Home / Self Care | Payer: Medicare Other | Source: Ambulatory Visit | Attending: Internal Medicine | Admitting: Internal Medicine

## 2019-12-11 DIAGNOSIS — R1012 Left upper quadrant pain: Secondary | ICD-10-CM | POA: Insufficient documentation

## 2019-12-23 ENCOUNTER — Encounter: Payer: Self-pay | Admitting: Internal Medicine

## 2020-01-10 ENCOUNTER — Encounter: Payer: Self-pay | Admitting: Internal Medicine

## 2020-01-27 ENCOUNTER — Ambulatory Visit
Admission: RE | Admit: 2020-01-27 | Discharge: 2020-01-27 | Disposition: A | Discharge status: Home / Self Care | Payer: Medicare Other | Source: Ambulatory Visit | Attending: Internal Medicine | Admitting: Internal Medicine

## 2020-01-27 ENCOUNTER — Other Ambulatory Visit: Payer: Self-pay

## 2020-01-27 DIAGNOSIS — Z1231 Encounter for screening mammogram for malignant neoplasm of breast: Secondary | ICD-10-CM | POA: Insufficient documentation

## 2020-02-15 DIAGNOSIS — H401123 Primary open-angle glaucoma, left eye, severe stage: Secondary | ICD-10-CM | POA: Diagnosis not present

## 2020-02-15 DIAGNOSIS — H02886 Meibomian gland dysfunction of left eye, unspecified eyelid: Secondary | ICD-10-CM | POA: Diagnosis not present

## 2020-02-15 DIAGNOSIS — H02883 Meibomian gland dysfunction of right eye, unspecified eyelid: Secondary | ICD-10-CM | POA: Diagnosis not present

## 2020-02-15 DIAGNOSIS — Z961 Presence of intraocular lens: Secondary | ICD-10-CM | POA: Diagnosis not present

## 2020-02-15 DIAGNOSIS — H401112 Primary open-angle glaucoma, right eye, moderate stage: Secondary | ICD-10-CM | POA: Diagnosis not present

## 2020-02-15 DIAGNOSIS — H16223 Keratoconjunctivitis sicca, not specified as Sjogren's, bilateral: Secondary | ICD-10-CM | POA: Diagnosis not present

## 2020-02-22 ENCOUNTER — Ambulatory Visit (INDEPENDENT_AMBULATORY_CARE_PROVIDER_SITE_OTHER): Payer: Medicare Other | Admitting: Dermatology

## 2020-02-22 ENCOUNTER — Other Ambulatory Visit: Payer: Self-pay

## 2020-02-22 DIAGNOSIS — L578 Other skin changes due to chronic exposure to nonionizing radiation: Secondary | ICD-10-CM

## 2020-02-22 DIAGNOSIS — L820 Inflamed seborrheic keratosis: Secondary | ICD-10-CM | POA: Diagnosis not present

## 2020-02-22 DIAGNOSIS — Z85828 Personal history of other malignant neoplasm of skin: Secondary | ICD-10-CM

## 2020-02-22 DIAGNOSIS — L570 Actinic keratosis: Secondary | ICD-10-CM

## 2020-02-22 DIAGNOSIS — L821 Other seborrheic keratosis: Secondary | ICD-10-CM | POA: Diagnosis not present

## 2020-02-22 NOTE — Patient Instructions (Signed)
Cryotherapy Aftercare  . Wash gently with soap and water everyday.   . Apply Vaseline and Band-Aid daily until healed.  

## 2020-02-22 NOTE — Progress Notes (Unsigned)
   Follow-Up Visit   Subjective  Wanda Nelson is a 87 y.o. female who presents for the following: Follow-up (Patient here today for 3 month ISK and biopsy follow up. ISK's at back were treated with LN2 at last visit. Patient advises there is a spot at left side that continues to itch. Biopsy at left distal dorsum forearm that showed SCC and was treated with EDC at last appt. Patient advises that area has healed completely. ). Patient does have some rough spots at arms and hands she would like checked.   The following portions of the chart were reviewed this encounter and updated as appropriate:   Tobacco  Allergies  Meds  Problems  Med Hx  Surg Hx  Fam Hx     Review of Systems:  No other skin or systemic complaints except as noted in HPI or Assessment and Plan.  Objective  Well appearing patient in no apparent distress; mood and affect are within normal limits.  A focused examination was performed including back, legs, arms, hands, face. Relevant physical exam findings are noted in the Assessment and Plan.  Objective  Left Upper Back x 2, hands, arms, legs x 15 (17): Erythematous keratotic or waxy stuck-on papule or plaque.   Objective  face x 3 (3): Erythematous thin papules/macules with gritty scale.    Assessment & Plan  Inflamed seborrheic keratosis (17) Left Upper Back x 2, hands, arms, legs x 15  Destruction of lesion - Left Upper Back x 2, hands, arms, legs x 15 Complexity: simple   Destruction method: cryotherapy   Informed consent: discussed and consent obtained   Timeout:  patient name, date of birth, surgical site, and procedure verified Lesion destroyed using liquid nitrogen: Yes   Region frozen until ice ball extended beyond lesion: Yes   Outcome: patient tolerated procedure well with no complications   Post-procedure details: wound care instructions given    AK (actinic keratosis) (3) face x 3  Destruction of lesion - face x 3 Complexity:  simple   Destruction method: cryotherapy   Informed consent: discussed and consent obtained   Timeout:  patient name, date of birth, surgical site, and procedure verified Lesion destroyed using liquid nitrogen: Yes   Region frozen until ice ball extended beyond lesion: Yes   Outcome: patient tolerated procedure well with no complications   Post-procedure details: wound care instructions given    Actinic Damage - chronic, secondary to cumulative UV radiation exposure/sun exposure over time - diffuse scaly erythematous macules with underlying dyspigmentation - Recommend daily broad spectrum sunscreen SPF 30+ to sun-exposed areas, reapply every 2 hours as needed.  - Call for new or changing lesions.  Seborrheic Keratoses - Stuck-on, waxy, tan-brown papules and plaques  - Discussed benign etiology and prognosis. - Observe - Call for any changes  History of Squamous Cell Carcinoma of the Skin - No evidence of recurrence today - No lymphadenopathy - Recommend regular full body skin exams - Recommend daily broad spectrum sunscreen SPF 30+ to sun-exposed areas, reapply every 2 hours as needed.  - Call if any new or changing lesions are noted between office visits  Return in about 1 year (around 02/21/2021) for TBSE.  Graciella Belton, RMA, am acting as scribe for Sarina Ser, MD . Documentation: I have reviewed the above documentation for accuracy and completeness, and I agree with the above.  Sarina Ser, MD

## 2020-02-23 ENCOUNTER — Encounter: Payer: Self-pay | Admitting: Dermatology

## 2020-04-14 ENCOUNTER — Ambulatory Visit (INDEPENDENT_AMBULATORY_CARE_PROVIDER_SITE_OTHER): Payer: Medicare Other

## 2020-04-14 VITALS — Ht 62.0 in | Wt 121.0 lb

## 2020-04-14 DIAGNOSIS — Z0000 Encounter for general adult medical examination without abnormal findings: Secondary | ICD-10-CM | POA: Diagnosis not present

## 2020-04-14 NOTE — Patient Instructions (Addendum)
[name] , Thank you for taking time to come for your Medicare Wellness Visit. I appreciate your ongoing commitment to your health goals. Please review the following plan we discussed and let me know if I can assist you in the future.   These are the goals we discussed: Goals      Patient Stated   .  I want to maintain my weight (pt-stated)      Stay hydrated and drink plenty of water       This is a list of the screening recommended for you and due dates:  Health Maintenance  Topic Date Due  . Tetanus Vaccine  04/14/2021*  . COVID-19 Vaccine (4 - Booster for Moderna series) 05/23/2020  . Flu Shot  08/08/2020  . DEXA scan (bone density measurement)  Addressed  . Pneumonia vaccines  Addressed  . HPV Vaccine  Aged Out  *Topic was postponed. The date shown is not the original due date.    Immunizations Immunization History  Administered Date(s) Administered  . Influenza, High Dose Seasonal PF 10/27/2019  . Influenza,inj,Quad PF,6+ Mos 09/22/2015  . Influenza-Unspecified 10/08/2017, 10/27/2018  . Moderna Sars-Covid-2 Vaccination 01/23/2019, 02/20/2019, 11/24/2019  . Pneumococcal Conjugate-13 03/24/2014  . Zoster Recombinat (Shingrix) 09/30/2017, 12/23/2017   Keep all routine maintenance appointments.   Follow up 05/13/20 @ 8:30  Advanced directives: on file  Conditions/risks identified: none new  Follow up in one year for your annual wellness visit.   Preventive Care 86 Years and Older, Female Preventive care refers to lifestyle choices and visits with your health care provider that can promote health and wellness. What does preventive care include?  A yearly physical exam. This is also called an annual well check.  Dental exams once or twice a year.  Routine eye exams. Ask your health care provider how often you should have your eyes checked.  Personal lifestyle choices, including:  Daily care of your teeth and gums.  Regular physical activity.  Eating a  healthy diet.  Avoiding tobacco and drug use.  Limiting alcohol use.  Practicing safe sex.  Taking low-dose aspirin every day.  Taking vitamin and mineral supplements as recommended by your health care provider. What happens during an annual well check? The services and screenings done by your health care provider during your annual well check will depend on your age, overall health, lifestyle risk factors, and family history of disease. Counseling  Your health care provider may ask you questions about your:  Alcohol use.  Tobacco use.  Drug use.  Emotional well-being.  Home and relationship well-being.  Sexual activity.  Eating habits.  History of falls.  Memory and ability to understand (cognition).  Work and work Statistician.  Reproductive health. Screening  You may have the following tests or measurements:  Height, weight, and BMI.  Blood pressure.  Lipid and cholesterol levels. These may be checked every 5 years, or more frequently if you are over 9 years old.  Skin check.  Lung cancer screening. You may have this screening every year starting at age 29 if you have a 30-pack-year history of smoking and currently smoke or have quit within the past 15 years.  Fecal occult blood test (FOBT) of the stool. You may have this test every year starting at age 73.  Flexible sigmoidoscopy or colonoscopy. You may have a sigmoidoscopy every 5 years or a colonoscopy every 10 years starting at age 56.  Hepatitis C blood test.  Hepatitis B blood test.  Sexually  transmitted disease (STD) testing.  Diabetes screening. This is done by checking your blood sugar (glucose) after you have not eaten for a while (fasting). You may have this done every 1-3 years.  Bone density scan. This is done to screen for osteoporosis. You may have this done starting at age 75.  Mammogram. This may be done every 1-2 years. Talk to your health care provider about how often you should  have regular mammograms. Talk with your health care provider about your test results, treatment options, and if necessary, the need for more tests. Vaccines  Your health care provider may recommend certain vaccines, such as:  Influenza vaccine. This is recommended every year.  Tetanus, diphtheria, and acellular pertussis (Tdap, Td) vaccine. You may need a Td booster every 10 years.  Zoster vaccine. You may need this after age 75.  Pneumococcal 13-valent conjugate (PCV13) vaccine. One dose is recommended after age 68.  Pneumococcal polysaccharide (PPSV23) vaccine. One dose is recommended after age 15. Talk to your health care provider about which screenings and vaccines you need and how often you need them. This information is not intended to replace advice given to you by your health care provider. Make sure you discuss any questions you have with your health care provider. Document Released: 01/21/2015 Document Revised: 09/14/2015 Document Reviewed: 10/26/2014 Elsevier Interactive Patient Education  2017 [employer] Prevention in the Home Falls can cause injuries. They can happen to people of all ages. There are many things you can do to make your home safe and to help prevent falls. What can I do on the outside of my home?  Regularly fix the edges of walkways and driveways and fix any cracks.  Remove anything that might make you trip as you walk through a door, such as a raised step or threshold.  Trim any bushes or trees on the path to your home.  Use bright outdoor lighting.  Clear any walking paths of anything that might make someone trip, such as rocks or tools.  Regularly check to see if handrails are loose or broken. Make sure that both sides of any steps have handrails.  Any raised decks and porches should have guardrails on the edges.  Have any leaves, snow, or ice cleared regularly.  Use sand or salt on walking paths during winter.  Clean up any spills in  your garage right away. This includes oil or grease spills. What can I do in the bathroom?  Use night lights.  Install grab bars by the toilet and in the tub and shower. Do not use towel bars as grab bars.  Use non-skid mats or decals in the tub or shower.  If you need to sit down in the shower, use a plastic, non-slip stool.  Keep the floor dry. Clean up any water that spills on the floor as soon as it happens.  Remove soap buildup in the tub or shower regularly.  Attach bath mats securely with double-sided non-slip rug tape.  Do not have throw rugs and other things on the floor that can make you trip. What can I do in the bedroom?  Use night lights.  Make sure that you have a light by your bed that is easy to reach.  Do not use any sheets or blankets that are too big for your bed. They should not hang down onto the floor.  Have a firm chair that has side arms. You can use this for support while you get dressed.  Do not have throw rugs and other things on the floor that can make you trip. What can I do in the kitchen?  Clean up any spills right away.  Avoid walking on wet floors.  Keep items that you use a lot in easy-to-reach places.  If you need to reach something above you, use a strong step stool that has a grab bar.  Keep electrical cords out of the way.  Do not use floor polish or wax that makes floors slippery. If you must use wax, use non-skid floor wax.  Do not have throw rugs and other things on the floor that can make you trip. What can I do with my stairs?  Do not leave any items on the stairs.  Make sure that there are handrails on both sides of the stairs and use them. Fix handrails that are broken or loose. Make sure that handrails are as long as the stairways.  Check any carpeting to make sure that it is firmly attached to the stairs. Fix any carpet that is loose or worn.  Avoid having throw rugs at the top or bottom of the stairs. If you do have  throw rugs, attach them to the floor with carpet tape.  Make sure that you have a light switch at the top of the stairs and the bottom of the stairs. If you do not have them, ask someone to add them for you. What else can I do to help prevent falls?  Wear shoes that:  Do not have high heels.  Have rubber bottoms.  Are comfortable and fit you well.  Are closed at the toe. Do not wear sandals.  If you use a stepladder:  Make sure that it is fully opened. Do not climb a closed stepladder.  Make sure that both sides of the stepladder are locked into place.  Ask someone to hold it for you, if possible.  Clearly mark and make sure that you can see:  Any grab bars or handrails.  First and last steps.  Where the edge of each step is.  Use tools that help you move around (mobility aids) if they are needed. These include:  Canes.  Walkers.  Scooters.  Crutches.  Turn on the lights when you go into a dark area. Replace any light bulbs as soon as they burn out.  Set up your furniture so you have a clear path. Avoid moving your furniture around.  If any of your floors are uneven, fix them.  If there are any pets around you, be aware of where they are.  Review your medicines with your doctor. Some medicines can make you feel dizzy. This can increase your chance of falling. Ask your doctor what other things that you can do to help prevent falls. This information is not intended to replace advice given to you by your health care provider. Make sure you discuss any questions you have with your health care provider. Document Released: 10/21/2008 Document Revised: 06/02/2015 Document Reviewed: 01/29/2014 Elsevier Interactive Patient Education  2017 [employer].

## 2020-04-14 NOTE — Progress Notes (Signed)
Subjective:   Danique Hartsough is a 87 y.o. female who presents for Medicare Annual (Subsequent) preventive examination.  Review of Systems    No ROS.  Medicare Wellness Virtual Visit.     Cardiac Risk Factors include: advanced age (>15men, >37 women)     Objective:    Today's Vitals   04/14/20 0934  Weight: 121 lb (54.9 kg)  Height: 5\' 2"  (1.575 m)   Body mass index is 22.13 kg/m.  Advanced Directives 04/14/2020 04/14/2019 03/31/2018 03/21/2018 03/19/2018 03/19/2018 03/17/2018  Does Patient Have a Medical Advance Directive? Yes Yes Yes Yes Yes No No  Type of Paramedic of [place];Living will [place];Living will [place];Living will [place];Living will [place];Living will - -  Does patient want to make changes to medical advance directive? No - Patient declined No - Patient declined - No - Patient declined No - Patient declined - -  Copy of [place] in Chart? Yes - validated most recent copy scanned in chart (See row information) Yes - validated most recent copy scanned in chart (See row information) No - copy requested - No - copy requested - -  Would patient like information on creating a medical advance directive? - - - No - Patient declined - No - Patient declined -    Current Medications (verified) Outpatient Encounter Medications as of 04/14/2020  Medication Sig  . Ascorbic Acid (VITAMIN C PO) Take by mouth.  Marland Kitchen aspirin EC 81 MG tablet Take 1 tablet (81 mg total) by mouth daily.  Marland Kitchen atorvastatin (LIPITOR) 20 MG tablet Take 1 tablet (20 mg total) by mouth daily at 6 PM.  . azithromycin (ZITHROMAX) 250 MG tablet 2 pills day 1 and 1 pill day 2-5 with food  . Calcium Carbonate-Vitamin D 500-125 MG-UNIT TABS Take 1 tablet by mouth daily.  . cholecalciferol (VITAMIN D) 1000 units tablet Take 1,000 Units by mouth daily.  . ciprofloxacin (CIPRO) 250 MG tablet  Take 1 tablet (250 mg total) by mouth 2 (two) times daily. With food  . ferrous sulfate (FEROSUL) 325 (65 FE) MG tablet Take 1 tablet (325 mg total) by mouth daily with breakfast. With vitamin C 500 mg daily  . Flaxseed, Linseed, (FLAX SEED OIL) 1300 MG CAPS Take 1,100 mg by mouth daily.   . Multiple Vitamin (MULTIVITAMIN) tablet Take 1 tablet by mouth daily.  . mupirocin ointment (BACTROBAN) 2 % Apply 1 application topically 2 (two) times daily.  . Omega-3 Fatty Acids (FISH OIL) 1200 MG CAPS Take 2,400 mg by mouth 2 (two) times daily.  Marland Kitchen omeprazole (PRILOSEC) 20 MG capsule Take 1 capsule (20 mg total) by mouth daily. 30 minutes before breakfast prn   No facility-administered encounter medications on file as of 04/14/2020.    Allergies (verified) Bee venom, Codeine, Morphine and related, Shellfish allergy, Bactrim [sulfamethoxazole-trimethoprim], and Nitrofurantoin   History: Past Medical History:  Diagnosis Date  . Actinic keratosis   . Anemia   . Glaucoma   . History of blood transfusion   . History of hiatal hernia   . Hyperlipidemia   . Incidental pulmonary nodule, > 79mm and < 12mm 03/20/2018   Noted on CT scan - needs f/u imaging 6 months  . 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 Squamous cell carcinoma of skin 11/12/2019   L distal dorsum forearm - ED&C   . Stroke [facility])    Past Surgical  History:  Procedure Laterality Date  . ABDOMINAL HYSTERECTOMY  1982   total  . BUNIONECTOMY Bilateral   . CLIPPING OF ATRIAL APPENDAGE  03/21/2018   Procedure: Clipping Of Atrial Appendage - using an AtriCure 59mm clip;  Surgeon: Rexene Alberts, MD;  [facility];  Service: Open Heart Surgery;;  . EXCISION OF ATRIAL MYXOMA N/A 03/21/2018   Procedure: Excision Of Atrial Myxoma;  Surgeon: Rexene Alberts, MD;  Location: [place];  Service: Open Heart Surgery;  Laterality: N/A;  . RECTOCELE REPAIR    . RIGHT/LEFT HEART CATH AND CORONARY ANGIOGRAPHY N/A 03/19/2018   Procedure: RIGHT/LEFT  HEART CATH AND CORONARY ANGIOGRAPHY;  Surgeon: Wellington Hampshire, MD;  Location: [place] [facility];  Service: Cardiovascular;  Laterality: N/A;  . TEE WITHOUT CARDIOVERSION N/A 03/21/2018   Procedure: TRANSESOPHAGEAL ECHOCARDIOGRAM (TEE);  Surgeon: Rexene Alberts, MD;  Location: [place];  Service: Open Heart Surgery;  Laterality: N/A;  . TONSILLECTOMY     Family History  Problem Relation Age of Onset  . Colon cancer Brother   . Colon cancer Maternal Uncle   . Colon cancer Maternal Grandmother   . Breast cancer Neg Hx    Social History   Socioeconomic History  . Marital status: Widowed    Spouse name: Not on file  . Number of children: Not on file  . Years of education: Not on file  . Highest education level: Not on file  Occupational History  . Not on file  Tobacco Use  . Smoking status: Never Smoker  . Smokeless tobacco: Never Used  Vaping Use  . Vaping Use: Never used  Substance and Sexual Activity  . Alcohol use: No    Alcohol/week: 0.0 standard drinks  . Drug use: No  . Sexual activity: Not Currently  Other Topics Concern  . Not on file  Social History Narrative   Lives [place],  husband died 12-17-2018 due to brain hemorrhage   Daughter in law Kailynn Satterly    widowed   Social Determinants of Health   Financial Resource Strain: Low Risk   . Difficulty of Paying Living Expenses: Not hard at all  Food Insecurity: No Food Insecurity  . Worried About Charity fundraiser in the Last Year: Never true  . Ran Out of Food in the Last Year: Never true  Transportation Needs: No Transportation Needs  . Lack of Transportation (Medical): No  . Lack of Transportation (Non-Medical): No  Physical Activity: Sufficiently Active  . Days of Exercise per Week: 5 days  . Minutes of Exercise per Session: 60 min  Stress: No Stress Concern Present  . Feeling of Stress : Not at all  Social Connections: Unknown  . Frequency of Communication with Friends and Family: More than three  times a week  . Frequency of Social Gatherings with Friends and Family: More than three times a week  . Attends Religious Services: Not on file  . Active Member of Clubs or Organizations: Not on file  . Attends Archivist Meetings: Not on file  . Marital Status: Widowed    Tobacco Counseling Counseling given: Not Answered   Clinical Intake:  Pre-visit preparation completed: Yes        Diabetes: No  How often do you need to have someone help you when you read instructions, pamphlets, or other written materials from your doctor or pharmacy?: 1 - Never   Interpreter Needed?: No      Activities of Daily  Living In your present state of health, do you have any difficulty performing the following activities: 04/14/2020  Hearing? Y  Comment L ear hearing loss  Vision? N  Difficulty concentrating or making decisions? N  Walking or climbing stairs? N  Dressing or bathing? N  Doing errands, shopping? N  Preparing Food and eating ? N  Using the Toilet? N  In the past six months, have you accidently leaked urine? N  Do you have problems with loss of bowel control? N  Managing your Medications? N  Managing your Finances? N  Housekeeping or managing your Housekeeping? N  Some recent data might be hidden    Patient Care Team: McLean-Scocuzza, Nino Glow, MD as PCP - General (Internal Medicine) Wellington Hampshire, MD as PCP - Cardiology (Cardiology)  Indicate any recent Medical Services you may have received from other than [facility] providers in the past year (date may be approximate).     Assessment:   This is a routine wellness examination for [place].  I connected with Amiah today by telephone and verified that I am speaking with the correct person using two identifiers. Location patient: home Location provider: work Persons participating in the virtual visit: patient, Marine scientist.    I discussed the limitations, risks, security and privacy concerns of performing an  evaluation and management service by telephone and the availability of in person appointments. The patient expressed understanding and verbally consented to this telephonic visit.    Interactive audio and video telecommunications were attempted between this provider and patient, however failed, due to patient having technical difficulties OR patient did not have access to video capability.  We continued and completed visit with audio only.  Some vital signs may be absent or patient reported.   Dietary issues and exercise activities discussed: Current Exercise Habits: Home exercise routine, Type of exercise: walking, Time (Minutes): 60, Frequency (Times/Week): 5, Weekly Exercise (Minutes/Week): 300, Intensity: Mild  Regular diet Fair water intake  Goals      Patient Stated   .  I want to maintain my weight (pt-stated)      Stay hydrated and drink plenty of water      Depression Screen PHQ 2/9 Scores 04/14/2020 05/05/2019 04/14/2019 09/26/2018 12/11/2017 09/25/2016 09/22/2015  PHQ - 2 Score 0 0 0 0 0 0 0  PHQ- 9 Score - - - - 0 - -    Fall Risk Fall Risk  04/14/2020 11/26/2019 06/24/2019 05/05/2019 04/14/2019  Falls in the past year? 0 0 0 0 0  Comment - - - - -  Number falls in past yr: 0 0 0 - -  Injury with Fall? 0 0 0 - -  Follow up Falls evaluation completed Falls evaluation completed Falls evaluation completed Falls evaluation completed Falls evaluation completed;Falls prevention discussed   FALL RISK PREVENTION PERTAINING TO THE HOME: Handrails in use when climbing stairs? Yes Home free of loose throw rugs in walkways, pet beds, electrical cords, etc? Yes  Adequate lighting in your home to reduce risk of falls? Yes   ASSISTIVE DEVICES UTILIZED TO PREVENT FALLS: Use of a cane, walker or w/c? No   TIMED UP AND GO: Was the test performed? No .   Cognitive Function: Patient is alert and oriented x3.  Denies difficulty focusing, making decisions, memory loss.  Enjoys spending time with  friends and brain challenging activities.  MMSE/6CIT deferred. Normal by communication/observation.      6CIT Screen 04/14/2019  What Year? 0 points  What month?  0 points  What time? 0 points  Count back from 20 0 points  Months in reverse 0 points  Repeat phrase 0 points  Total Score 0    Immunizations Immunization History  Administered Date(s) Administered  . Influenza, High Dose Seasonal PF 10/27/2019  . Influenza,inj,Quad PF,6+ Mos 09/22/2015  . Influenza-Unspecified 10/08/2017, 10/27/2018  . Moderna Sars-Covid-2 Vaccination 01/23/2019, 02/20/2019, 11/24/2019  . Pneumococcal Conjugate-13 03/24/2014  . Zoster Recombinat (Shingrix) 09/30/2017, 12/23/2017    TDAP status: Due, Education has been provided regarding the importance of this vaccine. Advised may receive this vaccine at local pharmacy or Health Dept. Aware to provide a copy of the vaccination record if obtained from local pharmacy or Health Dept. Verbalized acceptance and understanding. Deferred.   Health Maintenance Health Maintenance  Topic Date Due  . TETANUS/TDAP  04/14/2021 (Originally 01/09/2020)  . COVID-19 Vaccine (4 - Booster for Moderna series) 05/23/2020  . INFLUENZA VACCINE  08/08/2020  . DEXA SCAN  Addressed  . PNA vac Low Risk Adult  Addressed  . HPV VACCINES  Aged Out   Mammogram status: Completed 01/27/20. Repeat every year  Lung Cancer Screening: (Low Dose CT Chest recommended if Age 75-80 years, 30 pack-year currently smoking OR have quit w/in 15years.) does not qualify.   Hepatitis C Screening: does not qualify.  Vision Screening: Recommended annual ophthalmology exams for early detection of glaucoma and other disorders of the eye. Is the patient up to date with their annual eye exam?  Yes   Dental Screening: Recommended annual dental exams for proper oral hygiene.  Community Resource Referral / Chronic Care Management: CRR required this visit?  No   CCM required this visit?  No       Plan:   Keep all routine maintenance appointments.   Follow up 05/13/20 @ 8:30  I have personally reviewed and noted the following in the patient's chart:   . Medical and social history . Use of alcohol, tobacco or illicit drugs  . Current medications and supplements . Functional ability and status . Nutritional status . Physical activity . Advanced directives . List of other physicians . Hospitalizations, surgeries, and ER visits in previous 12 months . Vitals . Screenings to include cognitive, depression, and falls . Referrals and appointments  In addition, I have reviewed and discussed with patient certain preventive protocols, quality metrics, and best practice recommendations. A written personalized care plan for preventive services as well as general preventive health recommendations were provided to patient via mychart.     Varney Biles, LPN   01/10/2447

## 2020-05-02 ENCOUNTER — Encounter: Payer: Self-pay | Admitting: Internal Medicine

## 2020-05-03 ENCOUNTER — Ambulatory Visit (INDEPENDENT_AMBULATORY_CARE_PROVIDER_SITE_OTHER): Payer: Medicare Other | Admitting: Cardiovascular Disease

## 2020-05-03 ENCOUNTER — Other Ambulatory Visit: Payer: Self-pay

## 2020-05-03 ENCOUNTER — Encounter: Payer: Self-pay | Admitting: Cardiovascular Disease

## 2020-05-03 VITALS — BP 128/68 | HR 63 | Ht 62.0 in | Wt 117.5 lb

## 2020-05-03 DIAGNOSIS — Z86018 Personal history of other benign neoplasm: Secondary | ICD-10-CM | POA: Diagnosis not present

## 2020-05-03 DIAGNOSIS — I4891 Unspecified atrial fibrillation: Secondary | ICD-10-CM

## 2020-05-03 DIAGNOSIS — E785 Hyperlipidemia, unspecified: Secondary | ICD-10-CM | POA: Diagnosis not present

## 2020-05-03 DIAGNOSIS — R030 Elevated blood-pressure reading, without diagnosis of hypertension: Secondary | ICD-10-CM | POA: Diagnosis not present

## 2020-05-03 DIAGNOSIS — I9789 Other postprocedural complications and disorders of the circulatory system, not elsewhere classified: Secondary | ICD-10-CM | POA: Diagnosis not present

## 2020-05-03 NOTE — Patient Instructions (Signed)

## 2020-05-03 NOTE — Progress Notes (Signed)
Cardiology Office Note   Date:  05/05/2020   ID:  Wanda Nelson, Wanda Nelson Jun 05, 1931, MRN [id number]  PCP:  McLean-Scocuzza, Nino Glow, MD  Cardiologist:   [name], MD   Chief Complaint  Patient presents with  . Other    6 month f/u. Meds reviewed verbally with pt.      History of Present Illness: Wanda Nelson is a 87 y.o. female who presents for a follow-up visit post atrial myxoma resection. She has known history of anemia, hiatal hernia, hyperlipidemia and remote history of hemorrhagic stroke.  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  Cardiac catheterization before surgery showed no evidence of obstructive coronary artery disease.  She had intermittent palpitations and tachycardia shortly after surgery due to short runs of nonsustained ventricular tachycardia and intermittent A. fib with RVR. She did not require treatment for this and her symptoms resolved without intervention.  Echocardiogram in December 2020 showed normal LV systolic function with no significant valvular abnormalities.  There was a concern about possible recurrent atrial mass.  However, it appeared to be prominent scarring at the surgical site.  Most recent echocardiogram in June 2021showed no evidence of mass.  EF was 50 to 55%.  She has been doing very well with no chest pain, shortness of breath or palpitations.  She continues to walk 3 miles every day.  She is very happy living at [facility].  She is planning to travel soon to [facility] and [place].  Past Medical History:  Diagnosis Date  . Actinic keratosis   . Anemia   . Glaucoma   . History of blood transfusion   . History of hiatal hernia   . Hyperlipidemia   . Incidental pulmonary nodule, > 53mm and < 69mm 03/20/2018   Noted on CT scan - needs f/u imaging 6 months  . 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 Squamous cell carcinoma of skin  11/12/2019   L distal dorsum forearm - ED&C   . Stroke [facility])     Past Surgical History:  Procedure Laterality Date  . ABDOMINAL HYSTERECTOMY  1982   total  . BUNIONECTOMY Bilateral   . CLIPPING OF ATRIAL APPENDAGE  03/21/2018   Procedure: Clipping Of Atrial Appendage - using an AtriCure 36mm clip;  Surgeon: Rexene Alberts, MD;  Location: [facility] OR;  Service: Open Heart Surgery;;  . EXCISION OF ATRIAL MYXOMA N/A 03/21/2018   Procedure: Excision Of Atrial Myxoma;  Surgeon: Rexene Alberts, MD;  Location: [place];  Service: Open Heart Surgery;  Laterality: N/A;  . RECTOCELE REPAIR    . RIGHT/LEFT HEART CATH AND CORONARY ANGIOGRAPHY N/A 03/19/2018   Procedure: RIGHT/LEFT HEART CATH AND CORONARY ANGIOGRAPHY;  Surgeon: Wellington Hampshire, MD;  Location: [place] CV LAB;  Service: Cardiovascular;  Laterality: N/A;  . TEE WITHOUT CARDIOVERSION N/A 03/21/2018   Procedure: TRANSESOPHAGEAL ECHOCARDIOGRAM (TEE);  Surgeon: Rexene Alberts, MD;  Location: [place];  Service: Open Heart Surgery;  Laterality: N/A;  . TONSILLECTOMY       Current Outpatient Medications  Medication Sig Dispense Refill  . Ascorbic Acid (VITAMIN C PO) Take by mouth.    Marland Kitchen aspirin EC 81 MG tablet Take 1 tablet (81 mg total) by mouth daily. 90 tablet 3  . atorvastatin (LIPITOR) 20 MG tablet Take 1 tablet (20 mg total) by mouth daily at 6 PM. 90 tablet 3  .  Calcium Carbonate-Vitamin D 500-125 MG-UNIT TABS Take 1 tablet by mouth daily.    . cholecalciferol (VITAMIN D) 1000 units tablet Take 1,000 Units by mouth daily.    . ferrous sulfate (FEROSUL) 325 (65 FE) MG tablet Take 1 tablet (325 mg total) by mouth daily with breakfast. With vitamin C 500 mg daily 90 tablet 3  . Flaxseed, Linseed, (FLAX SEED OIL) 1300 MG CAPS Take 1,100 mg by mouth daily.     . Multiple Vitamin (MULTIVITAMIN) tablet Take 1 tablet by mouth daily.    . Omega-3 Fatty Acids (FISH OIL) 1200 MG CAPS Take 2,400 mg by mouth 2 (two) times daily.    Marland Kitchen omeprazole  (PRILOSEC) 20 MG capsule Take 1 capsule (20 mg total) by mouth daily. 30 minutes before breakfast prn 90 capsule 3   No current facility-administered medications for this visit.    Allergies:   Bee venom, Codeine, Morphine and related, Shellfish allergy, Bactrim [sulfamethoxazole-trimethoprim], and Nitrofurantoin    Social History:  The patient  reports that she has never smoked. She has never used smokeless tobacco. She reports that she does not drink alcohol and does not use drugs.   Family History:  The patient's family history includes Colon cancer in her brother, maternal grandmother, and maternal uncle.    ROS:  Please see the history of present illness.   Otherwise, review of systems are positive for none.   All other systems are reviewed and negative.    PHYSICAL EXAM: VS:  BP 128/68 (BP Location: Left Arm, Patient Position: Sitting, Cuff Size: Normal)   Pulse 63   Ht 5\' 2"  (1.575 m)   Wt 117 lb 8 oz (53.3 kg)   SpO2 97%   BMI 21.49 kg/m  , BMI Body mass index is 21.49 kg/m. GEN: Well nourished, well developed, in no acute distress  HEENT: normal  Neck: no JVD, carotid bruits, or masses Cardiac: RRR; no murmurs, rubs, or gallops,no edema  Respiratory:  clear to auscultation bilaterally, normal work of breathing GI: soft, nontender, nondistended, + BS MS: no deformity or atrophy  Skin: warm and dry, no rash Neuro:  Strength and sensation are intact Psych: euthymic mood, full affect   EKG:  EKG is ordered today. The ekg ordered today demonstrates normal sinus rhythm with nonspecific lateral T wave changes.   Recent Labs: 11/05/2019: ALT 20; BUN 14; Creatinine, Ser 0.88; Hemoglobin 15.1; Platelets 166.0; Potassium 3.9; Sodium 140; TSH 1.91    Lipid Panel    Component Value Date/Time   CHOL 168 11/05/2019 0832   TRIG 90.0 11/05/2019 0832   HDL 61.60 11/05/2019 0832   CHOLHDL 3 11/05/2019 0832   VLDL 18.0 11/05/2019 0832   LDLCALC 89 11/05/2019 0832      Wt  Readings from Last 3 Encounters:  05/03/20 117 lb 8 oz (53.3 kg)  04/14/20 121 lb (54.9 kg)  11/26/19 121 lb (54.9 kg)      No flowsheet data found.    ASSESSMENT AND PLAN:  1.  Status post surgical resection of atrial myxoma: She is doing extremely well.  No evidence of recurrent myxoma on echocardiogram last year.  2.  Postoperative atrial fibrillation: She reports no further palpitations or tachycardia.  She did not require any medication for this.  3.  Hyperlipidemia: Currently on atorvastatin.  Most recent lipid profile showed an LDL of 89.      Disposition:   FU with me in 6 months  Signed,  [name], MD  05/05/2020 7:24 AM    [facility]

## 2020-05-13 ENCOUNTER — Ambulatory Visit (INDEPENDENT_AMBULATORY_CARE_PROVIDER_SITE_OTHER): Payer: Medicare Other | Admitting: Internal Medicine

## 2020-05-13 ENCOUNTER — Encounter: Payer: Self-pay | Admitting: Internal Medicine

## 2020-05-13 ENCOUNTER — Other Ambulatory Visit: Payer: Self-pay

## 2020-05-13 VITALS — BP 142/84 | HR 59 | Temp 97.3°F | Ht 62.0 in | Wt 118.0 lb

## 2020-05-13 DIAGNOSIS — E785 Hyperlipidemia, unspecified: Secondary | ICD-10-CM | POA: Diagnosis not present

## 2020-05-13 DIAGNOSIS — N3000 Acute cystitis without hematuria: Secondary | ICD-10-CM | POA: Diagnosis not present

## 2020-05-13 DIAGNOSIS — D509 Iron deficiency anemia, unspecified: Secondary | ICD-10-CM

## 2020-05-13 DIAGNOSIS — R030 Elevated blood-pressure reading, without diagnosis of hypertension: Secondary | ICD-10-CM

## 2020-05-13 DIAGNOSIS — K219 Gastro-esophageal reflux disease without esophagitis: Secondary | ICD-10-CM | POA: Diagnosis not present

## 2020-05-13 DIAGNOSIS — Z1231 Encounter for screening mammogram for malignant neoplasm of breast: Secondary | ICD-10-CM

## 2020-05-13 DIAGNOSIS — T753XXA Motion sickness, initial encounter: Secondary | ICD-10-CM | POA: Diagnosis not present

## 2020-05-13 LAB — CBC WITH DIFFERENTIAL/PLATELET
Basophils Absolute: 0.1 10*3/uL (ref 0.0–0.1)
Basophils Relative: 0.7 % (ref 0.0–3.0)
Eosinophils Absolute: 0.3 10*3/uL (ref 0.0–0.7)
Eosinophils Relative: 3.8 % (ref 0.0–5.0)
HCT: 43.9 % (ref 36.0–46.0)
Hemoglobin: 14.8 g/dL (ref 12.0–15.0)
Lymphocytes Relative: 18.3 % (ref 12.0–46.0)
Lymphs Abs: 1.5 10*3/uL (ref 0.7–4.0)
MCHC: 33.8 g/dL (ref 30.0–36.0)
MCV: 95.1 fl (ref 78.0–100.0)
Monocytes Absolute: 0.6 10*3/uL (ref 0.1–1.0)
Monocytes Relative: 7.6 % (ref 3.0–12.0)
Neutro Abs: 5.7 10*3/uL (ref 1.4–7.7)
Neutrophils Relative %: 69.6 % (ref 43.0–77.0)
Platelets: 150 10*3/uL (ref 150.0–400.0)
RBC: 4.62 Mil/uL (ref 3.87–5.11)
RDW: 13.4 % (ref 11.5–15.5)
WBC: 8.1 10*3/uL (ref 4.0–10.5)

## 2020-05-13 LAB — LIPID PANEL
Cholesterol: 167 mg/dL (ref 0–200)
HDL: 59.7 mg/dL (ref >39.00)
LDL Cholesterol: 87 mg/dL (ref 0–99)
NonHDL: 107.71
Total CHOL/HDL Ratio: 3
Triglycerides: 103 mg/dL (ref 0.0–149.0)
VLDL: 20.6 mg/dL (ref 0.0–40.0)

## 2020-05-13 LAB — COMPREHENSIVE METABOLIC PANEL
ALT: 22 U/L (ref 0–35)
AST: 21 U/L (ref 0–37)
Albumin: 4.2 g/dL (ref 3.5–5.2)
Alkaline Phosphatase: 86 U/L (ref 39–117)
BUN: 18 mg/dL (ref 6–23)
CO2: 29 mEq/L (ref 19–32)
Calcium: 9.4 mg/dL (ref 8.4–10.5)
Chloride: 103 mEq/L (ref 96–112)
Creatinine, Ser: 0.83 mg/dL (ref 0.40–1.20)
GFR: 63.51 mL/min (ref >60.00)
Glucose, Bld: 86 mg/dL (ref 70–99)
Potassium: 4 mEq/L (ref 3.5–5.1)
Sodium: 139 mEq/L (ref 135–145)
Total Bilirubin: 1 mg/dL (ref 0.2–1.2)
Total Protein: 7.2 g/dL (ref 6.0–8.3)

## 2020-05-13 MED ORDER — ATORVASTATIN CALCIUM 20 MG PO TABS: 20.0000 mg | ORAL_TABLET | Freq: Every day | ORAL | 3 refills | Status: DC

## 2020-05-13 MED ORDER — OMEPRAZOLE 20 MG PO CPDR: 20.0000 mg | DELAYED_RELEASE_CAPSULE | Freq: Every day | ORAL | 3 refills | Status: DC

## 2020-05-13 MED ORDER — SCOPOLAMINE 1 MG/3DAYS TD PT72: 1.0000 | MEDICATED_PATCH | TRANSDERMAL | 0 refills | Status: DC

## 2020-05-13 NOTE — Patient Instructions (Addendum)
Non drowsy chewable dramamine or bonine  Goal <130/<80

## 2020-05-13 NOTE — Progress Notes (Signed)
Chief Complaint  Patient presents with  . Follow-up   F/u  1. BP sl elevated today 150/84 will monitor BP  2. Motion sickness going on cruise and wants scolpalmine patch  3. HLD lipitor 20 mg qhs  4. GERD and large hiatal hernia on prilosec 20 mg qd   Review of Systems  Constitutional: Negative for weight loss.  HENT: Negative for hearing loss.   Eyes: Negative for blurred vision.  Respiratory: Negative for shortness of breath.   Cardiovascular: Negative for chest pain.  Gastrointestinal: Negative for abdominal pain.  Musculoskeletal: Negative for falls and joint pain.  Skin: Negative for rash.  Neurological: Negative for headaches.  Psychiatric/Behavioral: Negative for depression and memory loss.   Past Medical History:  Diagnosis Date  . Actinic keratosis   . Anemia   . Glaucoma   . History of blood transfusion   . History of hiatal hernia   . Hyperlipidemia   . Incidental pulmonary nodule, > 36mm and < 13mm 03/20/2018   Noted on CT scan - needs f/u imaging 6 months  . 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 Squamous cell carcinoma of skin 11/12/2019   L distal dorsum forearm - ED&C   . Stroke [facility])    Past Surgical History:  Procedure Laterality Date  . ABDOMINAL HYSTERECTOMY  1982   total  . BUNIONECTOMY Bilateral   . CLIPPING OF ATRIAL APPENDAGE  03/21/2018   Procedure: Clipping Of Atrial Appendage - using an AtriCure 33mm clip;  Surgeon: Rexene Alberts, MD;  Location: [facility] OR;  Service: Open Heart Surgery;;  . EXCISION OF ATRIAL MYXOMA N/A 03/21/2018   Procedure: Excision Of Atrial Myxoma;  Surgeon: Rexene Alberts, MD;  Location: [place];  Service: Open Heart Surgery;  Laterality: N/A;  . RECTOCELE REPAIR    . RIGHT/LEFT HEART CATH AND CORONARY ANGIOGRAPHY N/A 03/19/2018   Procedure: RIGHT/LEFT HEART CATH AND CORONARY ANGIOGRAPHY;  Surgeon: Wellington Hampshire, MD;  Location: [place] CV LAB;  Service: Cardiovascular;  Laterality: N/A;  . TEE WITHOUT  CARDIOVERSION N/A 03/21/2018   Procedure: TRANSESOPHAGEAL ECHOCARDIOGRAM (TEE);  Surgeon: Rexene Alberts, MD;  Location: [place];  Service: Open Heart Surgery;  Laterality: N/A;  . TONSILLECTOMY     Family History  Problem Relation Age of Onset  . Colon cancer Brother   . Cancer Brother        neck tumor cancerous   . Esophageal varices Brother   . Colon cancer Maternal Uncle   . Colon cancer Maternal Grandmother   . Kidney disease Brother   . Coronary artery disease Brother        with stent, cabg  . Breast cancer Neg Hx    Social History   Socioeconomic History  . Marital status: Widowed    Spouse name: Not on file  . Number of children: Not on file  . Years of education: Not on file  . Highest education level: Not on file  Occupational History  . Not on file  Tobacco Use  . Smoking status: Never Smoker  . Smokeless tobacco: Never Used  Vaping Use  . Vaping Use: Never used  Substance and Sexual Activity  . Alcohol use: No    Alcohol/week: 0.0 standard drinks  . Drug use: No  . Sexual activity: Not Currently  Other Topics Concern  . Not on file  Social History Narrative   Lives [place]  husband died 24-Nov-2018 due to brain hemorrhage   Daughter  in law Tametra Koury    widowed   Social Determinants of Health   Financial Resource Strain: Low Risk   . Difficulty of Paying Living Expenses: Not hard at all  Food Insecurity: No Food Insecurity  . Worried About Charity fundraiser in the Last Year: Never true  . Ran Out of Food in the Last Year: Never true  Transportation Needs: No Transportation Needs  . Lack of Transportation (Medical): No  . Lack of Transportation (Non-Medical): No  Physical Activity: Sufficiently Active  . Days of Exercise per Week: 5 days  . Minutes of Exercise per Session: 60 min  Stress: No Stress Concern Present  . Feeling of Stress : Not at all  Social Connections: Unknown  . Frequency of Communication with Friends and Family: More than  three times a week  . Frequency of Social Gatherings with Friends and Family: More than three times a week  . Attends Religious Services: Not on file  . Active Member of Clubs or Organizations: Not on file  . Attends Archivist Meetings: Not on file  . Marital Status: Widowed  Intimate Partner Violence: Not At Risk  . Fear of Current or Ex-Partner: No  . Emotionally Abused: No  . Physically Abused: No  . Sexually Abused: No   Current Meds  Medication Sig  . Ascorbic Acid (VITAMIN C PO) Take by mouth.  Marland Kitchen aspirin EC 81 MG tablet Take 1 tablet (81 mg total) by mouth daily.  . Calcium Carbonate-Vitamin D 500-125 MG-UNIT TABS Take 1 tablet by mouth daily.  . cholecalciferol (VITAMIN D) 1000 units tablet Take 1,000 Units by mouth daily.  . ferrous sulfate (FEROSUL) 325 (65 FE) MG tablet Take 1 tablet (325 mg total) by mouth daily with breakfast. With vitamin C 500 mg daily  . Flaxseed, Linseed, (FLAX SEED OIL) 1300 MG CAPS Take 1,100 mg by mouth daily.   . Multiple Vitamin (MULTIVITAMIN) tablet Take 1 tablet by mouth daily.  . Omega-3 Fatty Acids (FISH OIL) 1200 MG CAPS Take 2,400 mg by mouth 2 (two) times daily.  Marland Kitchen scopolamine (TRANSDERM-SCOP) 1 MG/3DAYS Place 1 patch (1.5 mg total) onto the skin every 3 (three) days. 30 minutes to 4 hrs before travel  . [DISCONTINUED] atorvastatin (LIPITOR) 20 MG tablet Take 1 tablet (20 mg total) by mouth daily at 6 PM.  . [DISCONTINUED] omeprazole (PRILOSEC) 20 MG capsule Take 1 capsule (20 mg total) by mouth daily. 30 minutes before breakfast prn   Allergies  Allergen Reactions  . Bee Venom Anaphylaxis and Hives  . Codeine Other (See Comments)    Altered mental status Altered mental status  . Morphine And Related     Altered mental status  . Shellfish Allergy Anaphylaxis and Hives    Mainly shrimp  . Bactrim [Sulfamethoxazole-Trimethoprim]     ? Reaction    . Nitrofurantoin Rash   Recent Results (from the past 2160 hour(s))   Comprehensive metabolic panel     Status: None   Collection Time: 05/13/20  9:24 AM  Result Value Ref Range   Sodium 139 135 - 145 mEq/L   Potassium 4.0 3.5 - 5.1 mEq/L   Chloride 103 96 - 112 mEq/L   CO2 29 19 - 32 mEq/L   Glucose, Bld 86 70 - 99 mg/dL   BUN 18 6 - 23 mg/dL   Creatinine, Ser 0.83 0.40 - 1.20 mg/dL   Total Bilirubin 1.0 0.2 - 1.2 mg/dL   Alkaline Phosphatase 86  39 - 117 U/L   AST 21 0 - 37 U/L   ALT 22 0 - 35 U/L   Total Protein 7.2 6.0 - 8.3 g/dL   Albumin 4.2 3.5 - 5.2 g/dL   GFR 63.51 >60.00 mL/min    Comment: Calculated using the CKD-EPI Creatinine Equation (2021)   Calcium 9.4 8.4 - 10.5 mg/dL  Lipid panel     Status: None   Collection Time: 05/13/20  9:24 AM  Result Value Ref Range   Cholesterol 167 0 - 200 mg/dL    Comment: ATP III Classification       Desirable:  < 200 mg/dL               Borderline High:  200 - 239 mg/dL          High:  > = 240 mg/dL   Triglycerides 103.0 0.0 - 149.0 mg/dL    Comment: Normal:  <150 mg/dLBorderline High:  150 - 199 mg/dL   HDL 59.70 >39.00 mg/dL   VLDL 20.6 0.0 - 40.0 mg/dL   LDL Cholesterol 87 0 - 99 mg/dL   Total CHOL/HDL Ratio 3     Comment:                Men          Women1/2 Average Risk     3.4          3.3Average Risk          5.0          4.42X Average Risk          9.6          7.13X Average Risk          15.0          11.0                       NonHDL 107.71     Comment: NOTE:  Non-HDL goal should be 30 mg/dL higher than patient's LDL goal (i.e. LDL goal of < 70 mg/dL, would have non-HDL goal of < 100 mg/dL)  CBC with Differential/Platelet     Status: None   Collection Time: 05/13/20  9:24 AM  Result Value Ref Range   WBC 8.1 4.0 - 10.5 K/uL   RBC 4.62 3.87 - 5.11 Mil/uL   Hemoglobin 14.8 12.0 - 15.0 g/dL   HCT 43.9 36.0 - 46.0 %   MCV 95.1 78.0 - 100.0 fl   MCHC 33.8 30.0 - 36.0 g/dL   RDW 13.4 11.5 - 15.5 %   Platelets 150.0 150.0 - 400.0 K/uL   Neutrophils Relative % 69.6 43.0 - 77.0 %    Lymphocytes Relative 18.3 12.0 - 46.0 %   Monocytes Relative 7.6 3.0 - 12.0 %   Eosinophils Relative 3.8 0.0 - 5.0 %   Basophils Relative 0.7 0.0 - 3.0 %   Neutro Abs 5.7 1.4 - 7.7 K/uL   Lymphs Abs 1.5 0.7 - 4.0 K/uL   Monocytes Absolute 0.6 0.1 - 1.0 K/uL   Eosinophils Absolute 0.3 0.0 - 0.7 K/uL   Basophils Absolute 0.1 0.0 - 0.1 K/uL  Urine Culture     Status: None   Collection Time: 05/13/20 10:16 AM   Specimen: Urine   Urine  Result Value Ref Range   Urine Culture, Routine Final report    Organism ID, Bacteria Comment     Comment: Mixed urogenital flora 50,000-100,000 colony forming units  per mL   Urinalysis, Routine w reflex microscopic     Status: Abnormal   Collection Time: 05/13/20 10:16 AM  Result Value Ref Range   Specific Gravity, UA 1.011 1.005 - 1.030   pH, UA 7.5 5.0 - 7.5   Color, UA Yellow Yellow   Appearance Ur Clear Clear   Leukocytes,UA 2+ (A) Negative   Protein,UA Negative Negative/Trace   Glucose, UA Negative Negative   Ketones, UA Negative Negative   RBC, UA Negative Negative   Bilirubin, UA Negative Negative   Urobilinogen, Ur 0.2 0.2 - 1.0 mg/dL   Nitrite, UA Negative Negative   Microscopic Examination See below:     Comment: Microscopic was indicated and was performed.  Microscopic Examination     Status: Abnormal   Collection Time: 05/13/20 10:16 AM   Urine  Result Value Ref Range   WBC, UA >30 (A) 0 - 5 /hpf   RBC 0-2 0 - 2 /hpf   Epithelial Cells (non renal) 0-10 0 - 10 /hpf   Casts None seen None seen /lpf   Bacteria, UA None seen None seen/Few   Objective  Body mass index is 21.58 kg/m. Wt Readings from Last 3 Encounters:  05/13/20 118 lb (53.5 kg)  05/03/20 117 lb 8 oz (53.3 kg)  04/14/20 121 lb (54.9 kg)   Temp Readings from Last 3 Encounters:  05/13/20 (!) 97.3 F (36.3 C) (Oral)  11/26/19 98 F (36.7 C) (Oral)  07/02/19 97.9 F (36.6 C) (Temporal)   BP Readings from Last 3 Encounters:  05/13/20 (!) 142/84  05/03/20  128/68  11/26/19 136/80   Pulse Readings from Last 3 Encounters:  05/13/20 (!) 59  05/03/20 63  11/26/19 76    Physical Exam Vitals and nursing note reviewed.  Constitutional:      Appearance: Normal appearance. She is well-developed and well-groomed.  HENT:     Head: Normocephalic and atraumatic.  Cardiovascular:     Rate and Rhythm: Normal rate and regular rhythm.     Heart sounds: Normal heart sounds. No murmur heard.   Pulmonary:     Effort: Pulmonary effort is normal.     Breath sounds: Normal breath sounds.  Abdominal:     Tenderness: There is no abdominal tenderness.  Skin:    General: Skin is warm and dry.  Neurological:     General: No focal deficit present.     Mental Status: She is alert and oriented to person, place, and time. Mental status is at baseline.     Gait: Gait normal.  Psychiatric:        Attention and Perception: Attention and perception normal.        Mood and Affect: Mood and affect normal.        Speech: Speech normal.        Behavior: Behavior normal. Behavior is cooperative.        Thought Content: Thought content normal.        Cognition and Memory: Cognition and memory normal.        Judgment: Judgment normal.     Assessment  Plan  Elevated blood pressure reading repeat BP 150/84- Plan: Comprehensive metabolic panel, Lipid panel, CBC with Differential/Platelet Monitor BP if continues to elevated consider norvasc 2.5 mg qd  Motion sickness, initial encounter Scopolamine patch prn before cruise   Acute cystitis without hematuria with h/o UTI - Plan: Urinalysis, Routine w reflex microscopic, Urine Culture, Urine Culture, Urinalysis, Routine w reflex microscopic  Hyperlipidemia,  unspecified hyperlipidemia type - Plan: Lipid panel, atorvastatin (LIPITOR) 20 MG tablet  Gastroesophageal reflux disease - Plan: omeprazole (PRILOSEC) 20 MG capsule   HM Flu shotutd Tdap 05/02/20  shingrix 2/2 covid 19 3/3 moderna consider 4th dose   [id number] [place] [facility] pna 23? If hadno record from [place] will need in future  Out of age window pap Mammogram had in 2016 last consider b/l dx mammo and [place] will disc with pt 09/2018 right breast red and sore since heart surgery 03/2018 12/02/18 negative mammo and [place] mammogram 01/27/20 negative   Colonoscopy/EGD in 2014 [facility] colonoscopy normal EGD large H/H per pt  DEXA 2015/2016 [facility] normal per pt  Skin- Dr. Nehemiah Massed appt sch 11/2019 per ptseen and UTD  Continue walking 3 miles daily and healthy eating   Provider: Dr. Olivia Mackie McLean-Scocuzza-Internal Medicine

## 2020-05-14 DIAGNOSIS — Z23 Encounter for immunization: Secondary | ICD-10-CM | POA: Diagnosis not present

## 2020-05-14 LAB — URINALYSIS, ROUTINE W REFLEX MICROSCOPIC
Bilirubin, UA: NEGATIVE
Glucose, UA: NEGATIVE
Ketones, UA: NEGATIVE
Nitrite, UA: NEGATIVE
Protein,UA: NEGATIVE
RBC, UA: NEGATIVE
Specific Gravity, UA: 1.011 (ref 1.005–1.030)
Urobilinogen, Ur: 0.2 mg/dL (ref 0.2–1.0)
pH, UA: 7.5 (ref 5.0–7.5)

## 2020-05-14 LAB — MICROSCOPIC EXAMINATION
Bacteria, UA: NONE SEEN
Casts: NONE SEEN /lpf
WBC, UA: >30 /hpf — AB (ref 0–5)

## 2020-05-17 LAB — URINE CULTURE

## 2020-05-23 MED ORDER — ASPIRIN EC 81 MG PO TBEC: 81.0000 mg | DELAYED_RELEASE_TABLET | Freq: Every day | ORAL | 3 refills | Status: DC

## 2020-05-23 MED ORDER — FERROUS SULFATE 325 (65 FE) MG PO TABS: 325.0000 mg | ORAL_TABLET | Freq: Every day | ORAL | 3 refills | Status: DC

## 2020-05-30 ENCOUNTER — Telehealth: Payer: Self-pay

## 2020-05-30 ENCOUNTER — Encounter: Payer: Self-pay | Admitting: Internal Medicine

## 2020-05-30 ENCOUNTER — Telehealth: Payer: Self-pay | Admitting: Internal Medicine

## 2020-05-30 ENCOUNTER — Telehealth: Payer: Medicare Other | Admitting: Physician Assistant

## 2020-05-30 DIAGNOSIS — U071 COVID-19: Secondary | ICD-10-CM

## 2020-05-30 MED ORDER — MOLNUPIRAVIR EUA 200MG CAPSULE: 4.0000 | ORAL_CAPSULE | Freq: Two times a day (BID) | ORAL | 0 refills | Status: AC

## 2020-05-30 NOTE — Telephone Encounter (Signed)
Wanda Nelson returned the call for labs. She verbalized understanding and had no further questions. She states that she has been diagnosed with covid and made a virtual appointment with Laverna Peace on 05/31/2020.

## 2020-05-30 NOTE — Telephone Encounter (Signed)
Called and spoke with Wanda Nelson. Wanda Nelson states that she was seen and evaluated by [facility] and she was instructed to keep the virtual visit with Wanda Nelson tomorrow at 8:30am. Wanda Nelson had no further questions and states that she will see [place] on 05/31/2020

## 2020-05-30 NOTE — Telephone Encounter (Signed)
Called and spoke to [name]. Wanda Nelson restated that she has an appointment scheduled on 05/31/20 that it would make her Daughter-in-law less anxious if she were seen today. I informed her that we had no openings for today and gave her the options provided by Dr. Olivia Mackie and Laverna Peace. Wanda Nelson declined going to [facility] urgent care and asks if she could do the Virtual Visit with Wanda Nelson through [name]. Patient stayed on the phone until she accessed the [place] tab from my chart. She declined cancelling her appointment on 05/31/2020 as of now and wants to wait until after she is seen by a provider. Wanda Nelson states that she will call back to cancel once she has been seen.

## 2020-05-30 NOTE — Patient Instructions (Signed)
1. COVID-19  - mild symptoms, 3 days  - will start Molnupiravir   - encouraged close PCP follow-up

## 2020-05-30 NOTE — Telephone Encounter (Signed)
Noted and Reviewed virtual visit as below. She had a  [place] video visit. She should follow up with an in person evaluation at anytime if any symptoms getting worse.   1. COVID-19  - mild symptoms, 3 days  - will start [place]   - encouraged close PCP follow-up    Patient voiced understanding and agreement to plan.     Time:   Today, I have spent 15 minutes with the patient with telehealth technology discussing the above problems, reviewing the chart, previous notes, medications and orders.      Tests Ordered: No orders of the defined types were placed in this encounter.     Medication Changes:     Meds ordered this encounter  Medications   molnupiravir EUA 200 mg CAPS      Sig: Take 4 capsules (800 mg total) by mouth 2 (two) times daily for 5 days.      Dispense:  40 capsule      Refill:  0        Disposition:  Follow up with PCP as needed; to ER for worsening symptoms   Signed, Abigail Butts, PA-C  05/30/2020 1:48 PM

## 2020-05-30 NOTE — Telephone Encounter (Signed)
Pt's daughter-in-law, [name], called due to her father-in-law being a pt here before he passed away. She was inquiring if there were any available apts for the pt. She reports pt was diagnosed with covid this morning and symptom onset was late Friday evening into Saturday morning. She is concerned it wilb e too late by tomorrow for the pt to receive treatment if the provider thinks she is a candidate. Karrie Doffing, that paxlovid window treat is 5 days of less from symptom onset. Tomorrow would still be within those parameters. Advised if any symptoms worsened or pt developed any new symptoms to contact the PCPs office. Advised of ER precautions. [name] verbalized understanding.

## 2020-05-30 NOTE — Telephone Encounter (Signed)
Patient called in stated that she was tested for covid wanted the medication

## 2020-05-30 NOTE — Telephone Encounter (Signed)
Would advise since no appointments today to advise the patient to do a virtual visit with Dale - can check on Mychart or be seen in person at [facility] today may be best for patient since no available appointments today.

## 2020-05-30 NOTE — Progress Notes (Signed)
Wanda Nelson, scheuermann are scheduled for a virtual visit with your provider today.    Just as we do with appointments in the office, we must obtain your consent to participate.  Your consent will be active for this visit and any virtual visit you may have with one of our providers in the next 365 days.    If you have a MyChart account, I can also send a copy of this consent to you electronically.  All virtual visits are billed to your insurance company just like a traditional visit in the office.  As this is a virtual visit, video technology does not allow for your provider to perform a traditional examination.  This may limit your provider's ability to fully assess your condition.  If your provider identifies any concerns that need to be evaluated in person or the need to arrange testing such as labs, EKG, etc, we will make arrangements to do so.    Although advances in technology are sophisticated, we cannot ensure that it will always work on either your end or our end.  If the connection with a video visit is poor, we may have to switch to a telephone visit.  With either a video or telephone visit, we are not always able to ensure that we have a secure connection.   I need to obtain your verbal consent now.   Are you willing to proceed with your visit today?   [name] has provided verbal consent on 05/30/2020 for a virtual visit (video or telephone).   [name] [name], PA-C 05/30/2020  1:39 PM   Date:  05/30/2020   ID:  Wanda Nelson, DOB Sep 18, 1931, MRN [id number]  Patient Location: Home Provider Location: Home Office   Participants: Patient and Provider for Visit and Wrap up  Method of visit: Video  Location of Patient: Home Location of Provider: Home Office Consent was obtain for visit over the video. Services rendered by provider: Visit was performed via video  A video enabled telemedicine application was used and I verified that I am speaking with the correct  person using two identifiers.  PCP:  McLean-Scocuzza, Nino Glow, MD   Chief Complaint:  COVID +  History of Present Illness:    Wanda Nelson is a 87 y.o. female with history as stated below. Presents video telehealth for an acute care visit  Onset of symptoms was 05/27/20 and symptoms have been persistent and include: sore throat, laryngitis, congestion, cough.  Pt went on a trip last week and was around people who tested positive.    Denies having fevers, chills, shortness of breath, chest pain, ear pain.  Last booster: 2 weeks ago   No treatments PTA.  Pt reports she does not feel badly, but was encouraged to start the antiviral.   No other aggravating or relieving factors.  No other c/o.  The patient does have symptoms concerning for COVID-19 infection (fever, chills, cough, or new shortness of breath).  Patient has been tested for COVID during this illness.  She was positive this morning.  Past Medical, Surgical, Social History, Allergies, and Medications have been Reviewed.  Patient Active Problem List   Diagnosis Date Noted  . UTI (urinary tract infection) 10/04/2018  . Iron deficiency anemia 07/31/2018  . Hiatal hernia 07/31/2018  . Gastroesophageal reflux disease 07/31/2018  . Atherosclerosis 07/31/2018  . Incidental pulmonary nodule, > 24mm and < 58mm 03/20/2018  . s/p resection left atrial myxoma 03/19/2018  . HLD (hyperlipidemia) 03/15/2015  .  History of stroke 03/15/2015  . Glaucoma 03/15/2015    Social History   Tobacco Use  . Smoking status: Never Smoker  . Smokeless tobacco: Never Used  Substance Use Topics  . Alcohol use: No    Alcohol/week: 0.0 standard drinks     Current Outpatient Medications:  .  molnupiravir EUA 200 mg CAPS, Take 4 capsules (800 mg total) by mouth 2 (two) times daily for 5 days., Disp: 40 capsule, Rfl: 0 .  Ascorbic Acid (VITAMIN C PO), Take by mouth., Disp: , Rfl:  .  aspirin EC 81 MG tablet, Take 1 tablet (81 mg total)  by mouth daily., Disp: 90 tablet, Rfl: 3 .  atorvastatin (LIPITOR) 20 MG tablet, Take 1 tablet (20 mg total) by mouth daily at 6 PM., Disp: 90 tablet, Rfl: 3 .  Calcium Carbonate-Vitamin D 500-125 MG-UNIT TABS, Take 1 tablet by mouth daily., Disp: , Rfl:  .  cholecalciferol (VITAMIN D) 1000 units tablet, Take 1,000 Units by mouth daily., Disp: , Rfl:  .  ferrous sulfate (FEROSUL) 325 (65 FE) MG tablet, Take 1 tablet (325 mg total) by mouth daily with breakfast. With vitamin C 500 mg daily, Disp: 90 tablet, Rfl: 3 .  Flaxseed, Linseed, (FLAX SEED OIL) 1300 MG CAPS, Take 1,100 mg by mouth daily. , Disp: , Rfl:  .  Multiple Vitamin (MULTIVITAMIN) tablet, Take 1 tablet by mouth daily., Disp: , Rfl:  .  Omega-3 Fatty Acids (FISH OIL) 1200 MG CAPS, Take 2,400 mg by mouth 2 (two) times daily., Disp: , Rfl:  .  omeprazole (PRILOSEC) 20 MG capsule, Take 1 capsule (20 mg total) by mouth daily. 30 minutes before breakfast prn, Disp: 90 capsule, Rfl: 3 .  scopolamine (TRANSDERM-SCOP) 1 MG/3DAYS, Place 1 patch (1.5 mg total) onto the skin every 3 (three) days. 30 minutes to 4 hrs before travel, Disp: 10 patch, Rfl: 0   Allergies  Allergen Reactions  . Bee Venom Anaphylaxis and Hives  . Codeine Other (See Comments)    Altered mental status Altered mental status  . Morphine And Related     Altered mental status  . Shellfish Allergy Anaphylaxis and Hives    Mainly shrimp  . Bactrim [Sulfamethoxazole-Trimethoprim]     ? Reaction    . Nitrofurantoin Rash     Review of Systems  Constitutional: Negative for chills and fever.  HENT: Positive for congestion and sore throat. Negative for ear pain.   Eyes: Negative for blurred vision and double vision.  Respiratory: Positive for cough. Negative for shortness of breath and wheezing.   Cardiovascular: Negative for chest pain, palpitations and leg swelling.  Gastrointestinal: Negative for abdominal pain, diarrhea, nausea and vomiting.  Genitourinary: Negative  for dysuria.  Musculoskeletal: Negative for myalgias.  Skin: Negative for rash.  Neurological: Negative for loss of consciousness, weakness and headaches.  Psychiatric/Behavioral: The patient is not nervous/anxious.    See HPI for history of present illness.  Physical Exam Constitutional:      General: She is not in acute distress.    Appearance: Normal appearance. She is not ill-appearing.  HENT:     Head: Normocephalic and atraumatic.     Nose: No congestion.  Eyes:     Extraocular Movements: Extraocular movements intact.  Pulmonary:     Effort: Pulmonary effort is normal.     Comments: Speaks in full sentences No accessory muscle use Musculoskeletal:        General: Normal range of motion.  Cervical back: Normal range of motion.  Skin:    Coloration: Skin is not pale.  Neurological:     General: No focal deficit present.     Mental Status: She is alert. Mental status is at baseline.  Psychiatric:        Mood and Affect: Mood normal.               A&P  1. COVID-19  - mild symptoms, 3 days  - will start Molnupiravir   - encouraged close PCP follow-up   Patient voiced understanding and agreement to plan.   Time:   Today, I have spent 15 minutes with the patient with telehealth technology discussing the above problems, reviewing the chart, previous notes, medications and orders.    Tests Ordered: No orders of the defined types were placed in this encounter.   Medication Changes: Meds ordered this encounter  Medications  . molnupiravir EUA 200 mg CAPS    Sig: Take 4 capsules (800 mg total) by mouth 2 (two) times daily for 5 days.    Dispense:  40 capsule    Refill:  0     Disposition:  Follow up with PCP as needed; to ER for worsening symptoms  Signed, Abigail Butts, PA-C  05/30/2020 1:48 PM

## 2020-05-30 NOTE — Telephone Encounter (Signed)
I am not available  Can she be seen virtually elsewhere or does anyone in office have appt today?

## 2020-05-31 ENCOUNTER — Encounter: Payer: Self-pay | Admitting: Adult Health

## 2020-05-31 ENCOUNTER — Telehealth (INDEPENDENT_AMBULATORY_CARE_PROVIDER_SITE_OTHER): Payer: Medicare Other | Admitting: Adult Health

## 2020-05-31 VITALS — Ht 62.01 in | Wt 115.0 lb

## 2020-05-31 DIAGNOSIS — U071 COVID-19: Secondary | ICD-10-CM | POA: Insufficient documentation

## 2020-05-31 DIAGNOSIS — J069 Acute upper respiratory infection, unspecified: Secondary | ICD-10-CM | POA: Diagnosis not present

## 2020-05-31 HISTORY — DX: Acute upper respiratory infection, unspecified: J06.9

## 2020-05-31 NOTE — Progress Notes (Signed)
Virtual Visit via Telephone Note  I connected with Wanda Nelson on 05/31/20 at  8:30 AM EDT by telephone and verified that I am speaking with the correct person using two identifiers. Parties involved in visit as below:   Location: Patient: at home Provider: Provider: Provider's office at  [facility], [place].      I discussed the limitations, risks, security and privacy concerns of performing an evaluation and management service by telephone and the availability of in person appointments. I also discussed with the patient that there may be a patient responsible charge related to this service. The patient expressed understanding and agreed to proceed.   History of Present Illness: Patient saw physicians assistant by video,She was started on Molunpiravir 5/ 23/22 she reports she is feeling much better today. She has had one dose and denies any unwanted side effects. She has a scratchy throat that started onset 05/27/20 as well as congestion and cough. Patient was on a trip last week and many on trip tested positive.   She reports her symptoms are very mild and she feels much better today 05/31/20 that she did yesterday.   She did test positive for Covid 05/29/20. Patient  denies any fever, body aches,chills, rash, chest pain, shortness of breath, nausea, vomiting, or diarrhea.  Denies any trouble swallowing.   Denies dizziness, lightheadedness, pre syncopal or syncopal episodes.  She denies any breathing problems.   Observations/Objective:    Patient is alert and oriented and responsive to questions Engages in conversation with provider. Speaks in full sentences without any pauses without any shortness of breath or distress.   Assessment and Plan:  1. COVID-19 Continue antiviral as prescribed on 05/30/20. Report any side effects.   2. Viral URI Warm salt water gargles 3- 4 times a day for sore throat.  Seek care in person at anytime should  any symptoms change or worsen.    Last covid booster was 2 weeks ago.   Advised in person evaluation at anytime is advised if any symptoms do not improve, worsen or change at any given time.  Red Flags discussed. The patient was given clear instructions to go to ER or return to medical center if any red flags develop, symptoms do not improve, worsen or new problems develop. They verbalized understanding. Follow Up Instructions:    I discussed the assessment and treatment plan with the patient. The patient was provided an opportunity to ask questions and all were answered. The patient agreed with the plan and demonstrated an understanding of the instructions.   The patient was advised to call back or seek an in-person evaluation if the symptoms worsen or if the condition fails to improve as anticipated.  I provided 20  minutes of non-face-to-face time during this encounter.   Marcille Buffy, FNP

## 2020-06-15 ENCOUNTER — Encounter: Payer: Self-pay | Admitting: Internal Medicine

## 2020-06-15 ENCOUNTER — Other Ambulatory Visit: Payer: Self-pay

## 2020-06-15 ENCOUNTER — Ambulatory Visit (INDEPENDENT_AMBULATORY_CARE_PROVIDER_SITE_OTHER): Payer: Medicare Other | Admitting: Internal Medicine

## 2020-06-15 VITALS — BP 154/76 | HR 52 | Temp 97.5°F | Ht 62.01 in | Wt 118.8 lb

## 2020-06-15 DIAGNOSIS — R001 Bradycardia, unspecified: Secondary | ICD-10-CM

## 2020-06-15 DIAGNOSIS — I10 Essential (primary) hypertension: Secondary | ICD-10-CM

## 2020-06-15 MED ORDER — AMLODIPINE BESYLATE 2.5 MG PO TABS: 2.5000 mg | ORAL_TABLET | Freq: Every day | ORAL | 3 refills | Status: DC

## 2020-06-15 NOTE — Patient Instructions (Addendum)
Monitor blood pressure and your heart rate (pulse) Goal pulse 60-100   Bradycardia, Adult Bradycardia is a slower-than-normal heartbeat. A normal resting heart rate for an adult ranges from 60 to 100 beats per minute. With bradycardia, the resting heart rate is less than 60 beats per minute. Bradycardia can prevent enough oxygen from reaching certain areas of your body when you are active. It can be serious if it keeps enough oxygen from reaching your brain and other parts of your body. Bradycardia is not a problem for everyone. For some healthy adults, a slow resting heart rate is normal. What are the causes? This condition may be caused by:  A problem with the heart, including: ? A problem with the heart's electrical system, such as a heart block. With a heart block, electrical signals between the chambers of the heart are partially or completely blocked, so they are not able to work as they should. ? A problem with the heart's natural pacemaker (sinus node). ? Heart disease. ? A heart attack. ? Heart damage. ? Lyme disease. ? A heart infection. ? A heart condition that is present at birth (congenital heart defect).  Certain medicines that treat heart conditions.  Certain conditions, such as hypothyroidism and obstructive sleep apnea.  Problems with the balance of chemicals and other substances, like potassium, in the blood.  Trauma.  Radiation therapy.   What increases the risk? You are more likely to develop this condition if you:  Are age 34 or older.  Have high blood pressure (hypertension), high cholesterol (hyperlipidemia), or diabetes.  Drink heavily, use tobacco or nicotine products, or use drugs. What are the signs or symptoms? Symptoms of this condition include:  Light-headedness.  Feeling faint or fainting.  Fatigue and weakness.  Trouble with activity or exercise.  Shortness of breath.  Chest pain (angina).  Drowsiness.  Confusion.  Dizziness. How  is this diagnosed? This condition may be diagnosed based on:  Your symptoms.  Your medical history.  A physical exam. During the exam, your health care provider will listen to your heartbeat and check your pulse. To confirm the diagnosis, your health care provider may order tests, such as:  Blood tests.  An electrocardiogram (ECG). This test records the heart's electrical activity. The test can show how fast your heart is beating and whether the heartbeat is steady.  A test in which you wear a portable device (event recorder or Holter monitor) to record your heart's electrical activity while you go about your day.  Anexercise test. How is this treated? Treatment for this condition depends on the cause of the condition and how severe your symptoms are. Treatment may involve:  Treatment of the underlying condition.  Changing your medicines or how much medicine you take.  Having a small, battery-operated device called a pacemaker implanted under the skin. When bradycardia occurs, this device can be used to increase your heart rate and help your heart beat in a regular rhythm. Follow these instructions at home: Lifestyle  Manage any health conditions that contribute to bradycardia as told by your health care provider.  Follow a heart-healthy diet. A nutrition specialist (dietitian) can help educate you about healthy food options and changes.  Follow an exercise program that is approved by your health care provider.  Maintain a healthy weight.  Try to reduce or manage your stress, such as with yoga or meditation. If you need help reducing stress, ask your health care provider.  Do not use any products that contain  nicotine or tobacco, such as cigarettes, e-cigarettes, and chewing tobacco. If you need help quitting, ask your health care provider.  Do not use illegal drugs.  Limit alcohol intake to no more than 1 drink a day for nonpregnant women and 2 drinks a day for men. Be  aware of how much alcohol is in your drink. In the [place] one drink equals one 12 oz bottle of beer (355 mL), one 5 oz glass of wine (148 mL), or one 1 oz glass of hard liquor (44 mL).   General instructions  Take over-the-counter and prescription medicines only as told by your health care provider.  Keep all follow-up visits as told by your health care provider. This is important. How is this prevented? In some cases, bradycardia may be prevented by:  Treating underlying medical problems.  Stopping behaviors or medicines that can trigger the condition. Contact a health care provider if you:  Feel light-headed or dizzy.  Almost faint.  Feel weak or are easily fatigued during physical activity.  Experience confusion or have memory problems. Get help right away if:  You faint.  You have: ? An irregular heartbeat (palpitations). ? Chest pain. ? Trouble breathing. Summary  Bradycardia is a slower-than-normal heartbeat. With bradycardia, the resting heart rate is less than 60 beats per minute.  Treatment for this condition depends on the cause.  Manage any health conditions that contribute to bradycardia as told by your health care provider.  Do not use any products that contain nicotine or tobacco, such as cigarettes, e-cigarettes, and chewing tobacco, and limit alcohol intake.  Keep all follow-up visits as told by your health care provider. This is important. This information is not intended to replace advice given to you by your health care provider. Make sure you discuss any questions you have with your health care provider. Document Revised: 07/08/2017 Document Reviewed: 06/05/2017 Elsevier Patient Education  2021 [place] Eating Plan DASH stands for Dietary Approaches to Stop Hypertension. The DASH eating plan is a healthy eating plan that has been shown to:  Reduce high blood pressure (hypertension).  Reduce your risk for type 2 diabetes, heart disease,  and stroke.  Help with weight loss. What are tips for following this plan? Reading food labels  Check food labels for the amount of salt (sodium) per serving. Choose foods with less than 5 percent of the Daily Value of sodium. Generally, foods with less than 300 milligrams (mg) of sodium per serving fit into this eating plan.  To find whole grains, look for the word "whole" as the first word in the ingredient list. Shopping  Buy products labeled as "low-sodium" or "no salt added."  Buy fresh foods. Avoid canned foods and pre-made or frozen meals. Cooking  Avoid adding salt when cooking. Use salt-free seasonings or herbs instead of table salt or sea salt. Check with your health care provider or pharmacist before using salt substitutes.  Do not fry foods. Cook foods using healthy methods such as baking, boiling, grilling, roasting, and broiling instead.  Cook with heart-healthy oils, such as olive, canola, avocado, soybean, or sunflower oil. Meal planning  Eat a balanced diet that includes: ? 4 or more servings of fruits and 4 or more servings of vegetables each day. Try to fill one-half of your plate with fruits and vegetables. ? 6-8 servings of whole grains each day. ? Less than 6 oz (170 g) of lean meat, poultry, or fish each day. A 3-oz (85-g) serving of  meat is about the same size as a deck of cards. One egg equals 1 oz (28 g). ? 2-3 servings of low-fat dairy each day. One serving is 1 cup (237 mL). ? 1 serving of nuts, seeds, or beans 5 times each week. ? 2-3 servings of heart-healthy fats. Healthy fats called omega-3 fatty acids are found in foods such as walnuts, flaxseeds, fortified milks, and eggs. These fats are also found in cold-water fish, such as sardines, salmon, and mackerel.  Limit how much you eat of: ? Canned or prepackaged foods. ? Food that is high in trans fat, such as some fried foods. ? Food that is high in saturated fat, such as fatty meat. ? Desserts and  other sweets, sugary drinks, and other foods with added sugar. ? Full-fat dairy products.  Do not salt foods before eating.  Do not eat more than 4 egg yolks a week.  Try to eat at least 2 vegetarian meals a week.  Eat more home-cooked food and less restaurant, buffet, and fast food.   Lifestyle  When eating at a restaurant, ask that your food be prepared with less salt or no salt, if possible.  If you drink alcohol: ? Limit how much you use to:  0-1 drink a day for women who are not pregnant.  0-2 drinks a day for men. ? Be aware of how much alcohol is in your drink. In the [place] one drink equals one 12 oz bottle of beer (355 mL), one 5 oz glass of wine (148 mL), or one 1 oz glass of hard liquor (44 mL). General information  Avoid eating more than 2,300 mg of salt a day. If you have hypertension, you may need to reduce your sodium intake to 1,500 mg a day.  Work with your health care provider to maintain a healthy body weight or to lose weight. Ask what an ideal weight is for you.  Get at least 30 minutes of exercise that causes your heart to beat faster (aerobic exercise) most days of the week. Activities may include walking, swimming, or biking.  Work with your health care provider or dietitian to adjust your eating plan to your individual calorie needs. What foods should I eat? Fruits All fresh, dried, or frozen fruit. Canned fruit in natural juice (without added sugar). Vegetables Fresh or frozen vegetables (raw, steamed, roasted, or grilled). Low-sodium or reduced-sodium tomato and vegetable juice. Low-sodium or reduced-sodium tomato sauce and tomato paste. Low-sodium or reduced-sodium canned vegetables. Grains Whole-grain or whole-wheat bread. Whole-grain or whole-wheat pasta. Brown rice. [place]. Bulgur. Whole-grain and low-sodium cereals. Pita bread. Low-fat, low-sodium crackers. Whole-wheat flour tortillas. Meats and other proteins Skinless chicken or [place].  Ground chicken or [place]. Pork with fat trimmed off. Fish and seafood. Egg whites. Dried beans, peas, or lentils. Unsalted nuts, nut butters, and seeds. Unsalted canned beans. Lean cuts of beef with fat trimmed off. Low-sodium, lean precooked or cured meat, such as sausages or meat loaves. Dairy Low-fat (1%) or fat-free (skim) milk. Reduced-fat, low-fat, or fat-free cheeses. Nonfat, low-sodium ricotta or cottage cheese. Low-fat or nonfat yogurt. Low-fat, low-sodium cheese. Fats and oils Soft margarine without trans fats. Vegetable oil. Reduced-fat, low-fat, or light mayonnaise and salad dressings (reduced-sodium). Canola, safflower, olive, avocado, soybean, and sunflower oils. Avocado. Seasonings and condiments Herbs. Spices. Seasoning mixes without salt. Other foods Unsalted popcorn and pretzels. Fat-free sweets. The items listed above may not be a complete list of foods and beverages you can eat. Contact a  dietitian for more information. What foods should I avoid? Fruits Canned fruit in a light or heavy syrup. Fried fruit. Fruit in cream or butter sauce. Vegetables Creamed or fried vegetables. Vegetables in a cheese sauce. Regular canned vegetables (not low-sodium or reduced-sodium). Regular canned tomato sauce and paste (not low-sodium or reduced-sodium). Regular tomato and vegetable juice (not low-sodium or reduced-sodium). Angie Fava. Olives. Grains Baked goods made with fat, such as croissants, muffins, or some breads. Dry pasta or rice meal packs. Meats and other proteins Fatty cuts of meat. Ribs. Fried meat. Berniece Salines. Bologna, salami, and other precooked or cured meats, such as sausages or meat loaves. Fat from the back of a pig (fatback). Bratwurst. Salted nuts and seeds. Canned beans with added salt. Canned or smoked fish. Whole eggs or egg yolks. Chicken or [place] with skin. Dairy Whole or 2% milk, cream, and half-and-half. Whole or full-fat cream cheese. Whole-fat or sweetened yogurt.  Full-fat cheese. Nondairy creamers. Whipped toppings. Processed cheese and cheese spreads. Fats and oils Butter. Stick margarine. Lard. Shortening. Ghee. Bacon fat. Tropical oils, such as coconut, palm kernel, or palm oil. Seasonings and condiments Onion salt, garlic salt, seasoned salt, table salt, and sea salt. Worcestershire sauce. Tartar sauce. Barbecue sauce. Teriyaki sauce. Soy sauce, including reduced-sodium. Steak sauce. Canned and packaged gravies. Fish sauce. Oyster sauce. Cocktail sauce. Store-bought horseradish. Ketchup. Mustard. Meat flavorings and tenderizers. Bouillon cubes. Hot sauces. Pre-made or packaged marinades. Pre-made or packaged taco seasonings. Relishes. Regular salad dressings. Other foods Salted popcorn and pretzels. The items listed above may not be a complete list of foods and beverages you should avoid. Contact a dietitian for more information. Where to find more information  National Heart, Lung, and Blood Institute: https://wilson-eaton.com/  American Heart Association: www.heart.org  Academy of Nutrition and Dietetics: www.eatright.[place]: www.kidney.org Summary  The DASH eating plan is a healthy eating plan that has been shown to reduce high blood pressure (hypertension). It may also reduce your risk for type 2 diabetes, heart disease, and stroke.  When on the DASH eating plan, aim to eat more fresh fruits and vegetables, whole grains, lean proteins, low-fat dairy, and heart-healthy fats.  With the DASH eating plan, you should limit salt (sodium) intake to 2,300 mg a day. If you have hypertension, you may need to reduce your sodium intake to 1,500 mg a day.  Work with your health care provider or dietitian to adjust your eating plan to your individual calorie needs. This information is not intended to replace advice given to you by your health care provider. Make sure you discuss any questions you have with your health care  provider. Document Revised: 11/28/2018 Document Reviewed: 11/28/2018 Elsevier Patient Education  2021 [place].  Managing Your Hypertension Hypertension, also called high blood pressure, is when the force of the blood pressing against the walls of the arteries is too strong. Arteries are blood vessels that carry blood from your heart throughout your body. Hypertension forces the heart to work harder to pump blood and may cause the arteries to become narrow or stiff. Understanding blood pressure readings Your personal target blood pressure may vary depending on your medical conditions, your age, and other factors. A blood pressure reading includes a higher number over a lower number. Ideally, your blood pressure should be below 120/80. You should know that:  The first, or top, number is called the systolic pressure. It is a measure of the pressure in your arteries as your heart beats.  The  second, or bottom number, is called the diastolic pressure. It is a measure of the pressure in your arteries as the heart relaxes. Blood pressure is classified into four stages. Based on your blood pressure reading, your health care provider may use the following stages to determine what type of treatment you need, if any. Systolic pressure and diastolic pressure are measured in a unit called mmHg. Normal  Systolic pressure: below 409.  Diastolic pressure: below 80. Elevated  Systolic pressure: 811-914.  Diastolic pressure: below 80. Hypertension stage 1  Systolic pressure: 782-956.  Diastolic pressure: 21-30. Hypertension stage 2  Systolic pressure: 865 or above.  Diastolic pressure: 90 or above. How can this condition affect me? Managing your hypertension is an important responsibility. Over time, hypertension can damage the arteries and decrease blood flow to important parts of the body, including the brain, heart, and kidneys. Having untreated or uncontrolled hypertension can lead to:  A  heart attack.  A stroke.  A weakened blood vessel (aneurysm).  Heart failure.  Kidney damage.  Eye damage.  Metabolic syndrome.  Memory and concentration problems.  Vascular dementia. What actions can I take to manage this condition? Hypertension can be managed by making lifestyle changes and possibly by taking medicines. Your health care provider will help you make a plan to bring your blood pressure within a normal range. Nutrition  Eat a diet that is high in fiber and potassium, and low in salt (sodium), added sugar, and fat. An example eating plan is called the Dietary Approaches to Stop Hypertension (DASH) diet. To eat this way: ? Eat plenty of fresh fruits and vegetables. Try to fill one-half of your plate at each meal with fruits and vegetables. ? Eat whole grains, such as whole-wheat pasta, brown rice, or whole-grain bread. Fill about one-fourth of your plate with whole grains. ? Eat low-fat dairy products. ? Avoid fatty cuts of meat, processed or cured meats, and poultry with skin. Fill about one-fourth of your plate with lean proteins such as fish, chicken without skin, beans, eggs, and tofu. ? Avoid pre-made and processed foods. These tend to be higher in sodium, added sugar, and fat.  Reduce your daily sodium intake. Most people with hypertension should eat less than 1,500 mg of sodium a day.   Lifestyle  Work with your health care provider to maintain a healthy body weight or to lose weight. Ask what an ideal weight is for you.  Get at least 30 minutes of exercise that causes your heart to beat faster (aerobic exercise) most days of the week. Activities may include walking, swimming, or biking.  Include exercise to strengthen your muscles (resistance exercise), such as weight lifting, as part of your weekly exercise routine. Try to do these types of exercises for 30 minutes at least 3 days a week.  Do not use any products that contain nicotine or tobacco, such as  cigarettes, e-cigarettes, and chewing tobacco. If you need help quitting, ask your health care provider.  Control any long-term (chronic) conditions you have, such as high cholesterol or diabetes.  Identify your sources of stress and find ways to manage stress. This may include meditation, deep breathing, or making time for fun activities.   Alcohol use  Do not drink alcohol if: ? Your health care provider tells you not to drink. ? You are pregnant, may be pregnant, or are planning to become pregnant.  If you drink alcohol: ? Limit how much you use to:  0-1 drink a  day for women.  0-2 drinks a day for men. ? Be aware of how much alcohol is in your drink. In the [place] one drink equals one 12 oz bottle of beer (355 mL), one 5 oz glass of wine (148 mL), or one 1 oz glass of hard liquor (44 mL). Medicines Your health care provider may prescribe medicine if lifestyle changes are not enough to get your blood pressure under control and if:  Your systolic blood pressure is 130 or higher.  Your diastolic blood pressure is 80 or higher. Take medicines only as told by your health care provider. Follow the directions carefully. Blood pressure medicines must be taken as told by your health care provider. The medicine does not work as well when you skip doses. Skipping doses also puts you at risk for problems. Monitoring Before you monitor your blood pressure:  Do not smoke, drink caffeinated beverages, or exercise within 30 minutes before taking a measurement.  Use the bathroom and empty your bladder (urinate).  Sit quietly for at least 5 minutes before taking measurements. Monitor your blood pressure at home as told by your health care provider. To do this:  Sit with your back straight and supported.  Place your feet flat on the floor. Do not cross your legs.  Support your arm on a flat surface, such as a table. Make sure your upper arm is at heart level.  Each time you measure, take  two or three readings one minute apart and record the results. You may also need to have your blood pressure checked regularly by your health care provider.   General information  Talk with your health care provider about your diet, exercise habits, and other lifestyle factors that may be contributing to hypertension.  Review all the medicines you take with your health care provider because there may be side effects or interactions.  Keep all visits as told by your health care provider. Your health care provider can help you create and adjust your plan for managing your high blood pressure. Where to find more information  National Heart, Lung, and Blood Institute: https://wilson-eaton.com/  American Heart Association: www.heart.org Contact a health care provider if:  You think you are having a reaction to medicines you have taken.  You have repeated (recurrent) headaches.  You feel dizzy.  You have swelling in your ankles.  You have trouble with your vision. Get help right away if:  You develop a severe headache or confusion.  You have unusual weakness or numbness, or you feel faint.  You have severe pain in your chest or abdomen.  You vomit repeatedly.  You have trouble breathing. These symptoms may represent a serious problem that is an emergency. Do not wait to see if the symptoms will go away. Get medical help right away. Call your local emergency services (911 in the [place] Do not drive yourself to the hospital. Summary  Hypertension is when the force of blood pumping through your arteries is too strong. If this condition is not controlled, it may put you at risk for serious complications.  Your personal target blood pressure may vary depending on your medical conditions, your age, and other factors. For most people, a normal blood pressure is less than 120/80.  Hypertension is managed by lifestyle changes, medicines, or both.  Lifestyle changes to help manage hypertension  include losing weight, eating a healthy, low-sodium diet, exercising more, stopping smoking, and limiting alcohol. This information is not intended to replace advice given  to you by your health care provider. Make sure you discuss any questions you have with your health care provider. Document Revised: 01/30/2019 Document Reviewed: 11/25/2018 Elsevier Patient Education  2021 [place].  Amlodipine Tablets What is this medicine? AMLODIPINE (am LOE di peen) is a calcium channel blocker. It relaxes your blood vessels and decreases the amount of work the heart has to do. It treats high blood pressure and/or prevents chest pain (also called angina). This medicine may be used for other purposes; ask your health care provider or pharmacist if you have questions. COMMON BRAND NAME(S): Norvasc What should I tell my health care provider before I take this medicine? They need to know if you have any of these conditions:  heart disease  liver disease  an unusual or allergic reaction to amlodipine, other drugs, foods, dyes, or preservatives  pregnant or trying to get pregnant  breast-feeding How should I use this medicine? Take this medicine by mouth. Take it as directed on the prescription label at the same time every day. You can take it with or without food. If it upsets your stomach, take it with food. Keep taking it unless your health care provider tells you to stop. Talk to your health care provider about the use of this medicine in children. While it may be prescribed for children as young as 6 for selected conditions, precautions do apply. Overdosage: If you think you have taken too much of this medicine contact a poison control center or emergency room at once. NOTE: This medicine is only for you. Do not share this medicine with others. What if I miss a dose? If you miss a dose, take it as soon as you can. If it is almost time for your next dose, take only that dose. Do not take double or  extra doses. What may interact with this medicine? This medicine may interact with the following medications:  clarithromycin  cyclosporine  diltiazem  itraconazole  simvastatin  tacrolimus This list may not describe all possible interactions. Give your health care provider a list of all the medicines, herbs, non-prescription drugs, or dietary supplements you use. Also tell them if you smoke, drink alcohol, or use illegal drugs. Some items may interact with your medicine. What should I watch for while using this medicine? Visit your health care provider for regular checks on your progress. Check your blood pressure as directed. Ask your health care provider what your blood pressure should be. Also, find out when you should contact him or her. Do not treat yourself for coughs, colds, or pain while you are using this medicine without asking your health care provider for advice. Some medicines may increase your blood pressure. You may get drowsy or dizzy. Do not drive, use machinery, or do anything that needs mental alertness until you know how this medicine affects you. Do not stand up or sit up quickly, especially if you are an older patient. This reduces the risk of dizzy or fainting spells. Alcohol can make you more drowsy and dizzy. Avoid alcoholic drinks. What side effects may I notice from receiving this medicine? Side effects that you should report to your doctor or health care provider as soon as possible:  allergic reactions (skin rash, itching or hives; swelling of the face, lips, or tongue)  heart attack (trouble breathing; pain or tightness in the chest, neck, back or arms; unusually weak or tired)  low blood pressure (dizziness; feeling faint or lightheaded, falls; unusually weak or tired) Side  effects that usually do not require medical attention (report these to your doctor or health care provider if they continue or are bothersome):  facial  flushing  nausea  palpitations  stomach pain  sudden weight gain  swelling of the ankles, feet, hands This list may not describe all possible side effects. Call your doctor for medical advice about side effects. You may report side effects to FDA at 1-800-FDA-1088. Where should I keep my medicine? Keep out of the reach of children and pets. Store at room temperature between 20 and 25 degrees C (68 and 77 degrees F). Protect from light and moisture. Keep the container tightly closed. Get rid of any unused medicine after the expiration date. To get rid of medicines that are no longer needed or have expired:  Take the medicine to a medicine take-back program. Check with your pharmacy or law enforcement to find a location.  If you cannot return the medicine, check the label or package insert to see if the medicine should be thrown out in the garbage or flushed down the toilet. If you are not sure, ask your health care provider. If it is safe to put in the trash, empty the medicine out of the container. Mix the medicine with cat litter, dirt, coffee grounds, or other unwanted substance. Seal the mixture in a bag or container. Put it in the trash. NOTE: This sheet is a summary. It may not cover all possible information. If you have questions about this medicine, talk to your doctor, pharmacist, or health care provider.  2021 Elsevier/Gold Standard (2019-11-21 14:59:47)

## 2020-06-15 NOTE — Progress Notes (Signed)
Chief Complaint  Patient presents with  . Blood Pressure Check  . Medication Management   F/u Htn  1BP elevated 120s-170s at home she is monitoring since last visit not on meds  2. HR low 52 w/o sx's  3. Doing better after covid +  Review of Systems  Constitutional: Negative for weight loss.  HENT: Negative for hearing loss.   Eyes: Negative for blurred vision.  Respiratory: Negative for shortness of breath.   Cardiovascular: Negative for chest pain.  Gastrointestinal: Negative for abdominal pain.  Musculoskeletal: Negative for falls and joint pain.  Skin: Negative for rash.  Psychiatric/Behavioral: Negative for depression.   Past Medical History:  Diagnosis Date  . Actinic keratosis   . Anemia   . COVID-19    05/30/20 had covid 19 pill   . Glaucoma   . History of blood transfusion   . History of hiatal hernia   . Hyperlipidemia   . Incidental pulmonary nodule, > 63mm and < 47mm 03/20/2018   Noted on CT scan - needs f/u imaging 6 months  . 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 Squamous cell carcinoma of skin 11/12/2019   L distal dorsum forearm - ED&C   . Stroke [facility])    Past Surgical History:  Procedure Laterality Date  . ABDOMINAL HYSTERECTOMY  1982   total  . BUNIONECTOMY Bilateral   . CLIPPING OF ATRIAL APPENDAGE  03/21/2018   Procedure: Clipping Of Atrial Appendage - using an AtriCure 42mm clip;  Surgeon: Rexene Alberts, MD;  Location: [facility] OR;  Service: Open Heart Surgery;;  . EXCISION OF ATRIAL MYXOMA N/A 03/21/2018   Procedure: Excision Of Atrial Myxoma;  Surgeon: Rexene Alberts, MD;  Location: [place];  Service: Open Heart Surgery;  Laterality: N/A;  . RECTOCELE REPAIR    . RIGHT/LEFT HEART CATH AND CORONARY ANGIOGRAPHY N/A 03/19/2018   Procedure: RIGHT/LEFT HEART CATH AND CORONARY ANGIOGRAPHY;  Surgeon: Wellington Hampshire, MD;  Location: [place] CV LAB;  Service: Cardiovascular;  Laterality: N/A;  . TEE WITHOUT CARDIOVERSION N/A 03/21/2018   Procedure:  TRANSESOPHAGEAL ECHOCARDIOGRAM (TEE);  Surgeon: Rexene Alberts, MD;  Location: [place];  Service: Open Heart Surgery;  Laterality: N/A;  . TONSILLECTOMY     Family History  Problem Relation Age of Onset  . Colon cancer Brother   . Cancer Brother        neck tumor cancerous   . Esophageal varices Brother   . Colon cancer Maternal Uncle   . Colon cancer Maternal Grandmother   . Kidney disease Brother   . Coronary artery disease Brother        with stent, cabg  . Breast cancer Neg Hx    Social History   Socioeconomic History  . Marital status: Widowed    Spouse name: Not on file  . Number of children: Not on file  . Years of education: Not on file  . Highest education level: Not on file  Occupational History  . Not on file  Tobacco Use  . Smoking status: Never Smoker  . Smokeless tobacco: Never Used  Vaping Use  . Vaping Use: Never used  Substance and Sexual Activity  . Alcohol use: No    Alcohol/week: 0.0 standard drinks  . Drug use: No  . Sexual activity: Not Currently  Other Topics Concern  . Not on file  Social History Narrative   Lives [place],  husband died 12/18/2018 due to brain hemorrhage   Daughter in  law Horald Pollen    widowed   Social Determinants of Health   Financial Resource Strain: Low Risk   . Difficulty of Paying Living Expenses: Not hard at all  Food Insecurity: No Food Insecurity  . Worried About Charity fundraiser in the Last Year: Never true  . Ran Out of Food in the Last Year: Never true  Transportation Needs: No Transportation Needs  . Lack of Transportation (Medical): No  . Lack of Transportation (Non-Medical): No  Physical Activity: Sufficiently Active  . Days of Exercise per Week: 5 days  . Minutes of Exercise per Session: 60 min  Stress: No Stress Concern Present  . Feeling of Stress : Not at all  Social Connections: Unknown  . Frequency of Communication with Friends and Family: More than three times a week  . Frequency of Social  Gatherings with Friends and Family: More than three times a week  . Attends Religious Services: Not on file  . Active Member of Clubs or Organizations: Not on file  . Attends Archivist Meetings: Not on file  . Marital Status: Widowed  Intimate Partner Violence: Not At Risk  . Fear of Current or Ex-Partner: No  . Emotionally Abused: No  . Physically Abused: No  . Sexually Abused: No   Current Meds  Medication Sig  . amLODipine (NORVASC) 2.5 MG tablet Take 1 tablet (2.5 mg total) by mouth daily.  . Ascorbic Acid (VITAMIN C PO) Take by mouth.  Marland Kitchen aspirin EC 81 MG tablet Take 1 tablet (81 mg total) by mouth daily.  Marland Kitchen atorvastatin (LIPITOR) 20 MG tablet Take 1 tablet (20 mg total) by mouth daily at 6 PM.  . Calcium Carbonate-Vitamin D 500-125 MG-UNIT TABS Take 1 tablet by mouth daily.  . cholecalciferol (VITAMIN D) 1000 units tablet Take 1,000 Units by mouth daily.  . ferrous sulfate (FEROSUL) 325 (65 FE) MG tablet Take 1 tablet (325 mg total) by mouth daily with breakfast. With vitamin C 500 mg daily  . Flaxseed, Linseed, (FLAX SEED OIL) 1300 MG CAPS Take 1,100 mg by mouth daily.   . Multiple Vitamin (MULTIVITAMIN) tablet Take 1 tablet by mouth daily.  . Omega-3 Fatty Acids (FISH OIL) 1200 MG CAPS Take 2,400 mg by mouth 2 (two) times daily.  Marland Kitchen omeprazole (PRILOSEC) 20 MG capsule Take 1 capsule (20 mg total) by mouth daily. 30 minutes before breakfast prn   Allergies  Allergen Reactions  . Bee Venom Anaphylaxis and Hives  . Codeine Other (See Comments)    Altered mental status Altered mental status  . Morphine And Related     Altered mental status  . Shellfish Allergy Anaphylaxis and Hives    Mainly shrimp  . Bactrim [Sulfamethoxazole-Trimethoprim]     ? Reaction    . Nitrofurantoin Rash   Recent Results (from the past 2160 hour(s))  Comprehensive metabolic panel     Status: None   Collection Time: 05/13/20  9:24 AM  Result Value Ref Range   Sodium 139 135 - 145 mEq/L    Potassium 4.0 3.5 - 5.1 mEq/L   Chloride 103 96 - 112 mEq/L   CO2 29 19 - 32 mEq/L   Glucose, Bld 86 70 - 99 mg/dL   BUN 18 6 - 23 mg/dL   Creatinine, Ser 0.83 0.40 - 1.20 mg/dL   Total Bilirubin 1.0 0.2 - 1.2 mg/dL   Alkaline Phosphatase 86 39 - 117 U/L   AST 21 0 - 37 U/L  ALT 22 0 - 35 U/L   Total Protein 7.2 6.0 - 8.3 g/dL   Albumin 4.2 3.5 - 5.2 g/dL   GFR 63.51 >60.00 mL/min    Comment: Calculated using the CKD-EPI Creatinine Equation (2021)   Calcium 9.4 8.4 - 10.5 mg/dL  Lipid panel     Status: None   Collection Time: 05/13/20  9:24 AM  Result Value Ref Range   Cholesterol 167 0 - 200 mg/dL    Comment: ATP III Classification       Desirable:  < 200 mg/dL               Borderline High:  200 - 239 mg/dL          High:  > = 240 mg/dL   Triglycerides 103.0 0.0 - 149.0 mg/dL    Comment: Normal:  <150 mg/dLBorderline High:  150 - 199 mg/dL   HDL 59.70 >39.00 mg/dL   VLDL 20.6 0.0 - 40.0 mg/dL   LDL Cholesterol 87 0 - 99 mg/dL   Total CHOL/HDL Ratio 3     Comment:                Men          Women1/2 Average Risk     3.4          3.3Average Risk          5.0          4.42X Average Risk          9.6          7.13X Average Risk          15.0          11.0                       NonHDL 107.71     Comment: NOTE:  Non-HDL goal should be 30 mg/dL higher than patient's LDL goal (i.e. LDL goal of < 70 mg/dL, would have non-HDL goal of < 100 mg/dL)  CBC with Differential/Platelet     Status: None   Collection Time: 05/13/20  9:24 AM  Result Value Ref Range   WBC 8.1 4.0 - 10.5 K/uL   RBC 4.62 3.87 - 5.11 Mil/uL   Hemoglobin 14.8 12.0 - 15.0 g/dL   HCT 43.9 36.0 - 46.0 %   MCV 95.1 78.0 - 100.0 fl   MCHC 33.8 30.0 - 36.0 g/dL   RDW 13.4 11.5 - 15.5 %   Platelets 150.0 150.0 - 400.0 K/uL   Neutrophils Relative % 69.6 43.0 - 77.0 %   Lymphocytes Relative 18.3 12.0 - 46.0 %   Monocytes Relative 7.6 3.0 - 12.0 %   Eosinophils Relative 3.8 0.0 - 5.0 %   Basophils Relative 0.7 0.0 -  3.0 %   Neutro Abs 5.7 1.4 - 7.7 K/uL   Lymphs Abs 1.5 0.7 - 4.0 K/uL   Monocytes Absolute 0.6 0.1 - 1.0 K/uL   Eosinophils Absolute 0.3 0.0 - 0.7 K/uL   Basophils Absolute 0.1 0.0 - 0.1 K/uL  Urine Culture     Status: None   Collection Time: 05/13/20 10:16 AM   Specimen: Urine   Urine  Result Value Ref Range   Urine Culture, Routine Final report    Organism ID, Bacteria Comment     Comment: Mixed urogenital flora 50,000-100,000 colony forming units per mL   Urinalysis, Routine w reflex microscopic     Status:  Abnormal   Collection Time: 05/13/20 10:16 AM  Result Value Ref Range   Specific Gravity, UA 1.011 1.005 - 1.030   pH, UA 7.5 5.0 - 7.5   Color, UA Yellow Yellow   Appearance Ur Clear Clear   Leukocytes,UA 2+ (A) Negative   Protein,UA Negative Negative/Trace   Glucose, UA Negative Negative   Ketones, UA Negative Negative   RBC, UA Negative Negative   Bilirubin, UA Negative Negative   Urobilinogen, Ur 0.2 0.2 - 1.0 mg/dL   Nitrite, UA Negative Negative   Microscopic Examination See below:     Comment: Microscopic was indicated and was performed.  Microscopic Examination     Status: Abnormal   Collection Time: 05/13/20 10:16 AM   Urine  Result Value Ref Range   WBC, UA >30 (A) 0 - 5 /hpf   RBC 0-2 0 - 2 /hpf   Epithelial Cells (non renal) 0-10 0 - 10 /hpf   Casts None seen None seen /lpf   Bacteria, UA None seen None seen/Few   Objective  Body mass index is 21.72 kg/m. Wt Readings from Last 3 Encounters:  06/15/20 118 lb 12.8 oz (53.9 kg)  05/31/20 115 lb (52.2 kg)  05/13/20 118 lb (53.5 kg)   Temp Readings from Last 3 Encounters:  06/15/20 (!) 97.5 F (36.4 C) (Oral)  05/13/20 (!) 97.3 F (36.3 C) (Oral)  11/26/19 98 F (36.7 C) (Oral)   BP Readings from Last 3 Encounters:  06/15/20 (!) 154/76  05/13/20 (!) 142/84  05/03/20 128/68   Pulse Readings from Last 3 Encounters:  06/15/20 (!) 52  05/13/20 (!) 59  05/03/20 63    Physical Exam Vitals  and nursing note reviewed.  Constitutional:      Appearance: Normal appearance. She is well-developed and well-groomed.  HENT:     Head: Normocephalic and atraumatic.  Eyes:     Conjunctiva/sclera: Conjunctivae normal.     Pupils: Pupils are equal, round, and reactive to light.  Cardiovascular:     Rate and Rhythm: Regular rhythm. Bradycardia present.     Heart sounds: Normal heart sounds.  Pulmonary:     Effort: Pulmonary effort is normal.     Breath sounds: Normal breath sounds.  Skin:    General: Skin is warm and dry.  Neurological:     General: No focal deficit present.     Mental Status: She is alert and oriented to person, place, and time. Mental status is at baseline.     Gait: Gait normal.  Psychiatric:        Attention and Perception: Attention and perception normal.        Mood and Affect: Mood and affect normal.        Speech: Speech normal.        Behavior: Behavior normal. Behavior is cooperative.        Thought Content: Thought content normal.        Cognition and Memory: Cognition and memory normal.        Judgment: Judgment normal.     Assessment  Plan  Hypertension, unspecified type - Plan: amLODipine (NORVASC) 2.5 MG tablet qd   Sinus bradycardia  Consider cards in future w/o sx's  HM HM Flu shotutd Tdap 05/02/20  shingrix 2/2 covid 193/34moderna consider 4th dose  [id number] prevnar [place] pna 23? If hadno record from [place] will need in future  Out of age window pap Mammogram had in 2016 last consider b/l dx  mammo and [place] will disc with pt 09/2018 right breast red and sore since heart surgery 03/2018 12/02/18 negative mammo and [place] mammogram 01/27/20 negative   Colonoscopy/EGD in 2014 [facility] colonoscopy normal EGD large H/H per pt  DEXA 2015/2016 [facility] normal per pt  Skin- Dr. Nehemiah Massed appt sch 11/2019 per ptseen and UTD  Continue walking 3 miles daily and healthy eating   Provider:  Dr. Olivia Mackie McLean-Scocuzza-Internal Medicine

## 2020-06-20 DIAGNOSIS — Z961 Presence of intraocular lens: Secondary | ICD-10-CM | POA: Diagnosis not present

## 2020-06-20 DIAGNOSIS — H02886 Meibomian gland dysfunction of left eye, unspecified eyelid: Secondary | ICD-10-CM | POA: Diagnosis not present

## 2020-06-20 DIAGNOSIS — H16223 Keratoconjunctivitis sicca, not specified as Sjogren's, bilateral: Secondary | ICD-10-CM | POA: Diagnosis not present

## 2020-06-20 DIAGNOSIS — H02883 Meibomian gland dysfunction of right eye, unspecified eyelid: Secondary | ICD-10-CM | POA: Diagnosis not present

## 2020-06-20 DIAGNOSIS — H401123 Primary open-angle glaucoma, left eye, severe stage: Secondary | ICD-10-CM | POA: Diagnosis not present

## 2020-06-20 DIAGNOSIS — H401112 Primary open-angle glaucoma, right eye, moderate stage: Secondary | ICD-10-CM | POA: Diagnosis not present

## 2020-06-23 ENCOUNTER — Encounter: Payer: Self-pay | Admitting: Cardiovascular Disease

## 2020-06-23 ENCOUNTER — Ambulatory Visit (INDEPENDENT_AMBULATORY_CARE_PROVIDER_SITE_OTHER): Payer: Medicare Other | Admitting: Cardiovascular Disease

## 2020-06-23 ENCOUNTER — Other Ambulatory Visit: Payer: Self-pay

## 2020-06-23 VITALS — BP 130/60 | HR 62 | Ht 62.0 in | Wt 120.5 lb

## 2020-06-23 DIAGNOSIS — I9789 Other postprocedural complications and disorders of the circulatory system, not elsewhere classified: Secondary | ICD-10-CM

## 2020-06-23 DIAGNOSIS — I4891 Unspecified atrial fibrillation: Secondary | ICD-10-CM

## 2020-06-23 DIAGNOSIS — Z86018 Personal history of other benign neoplasm: Secondary | ICD-10-CM | POA: Diagnosis not present

## 2020-06-23 DIAGNOSIS — I10 Essential (primary) hypertension: Secondary | ICD-10-CM | POA: Diagnosis not present

## 2020-06-23 DIAGNOSIS — E785 Hyperlipidemia, unspecified: Secondary | ICD-10-CM

## 2020-06-23 DIAGNOSIS — R0602 Shortness of breath: Secondary | ICD-10-CM | POA: Diagnosis not present

## 2020-06-23 IMAGING — DX PORTABLE CHEST - 1 VIEW
1 series · 1 of 1 positions shown · non-contrast
Comparison: CT chest March 20, 2018

CLINICAL DATA: Atelectasis.

EXAM:
PORTABLE CHEST 1 VIEW

[chest]
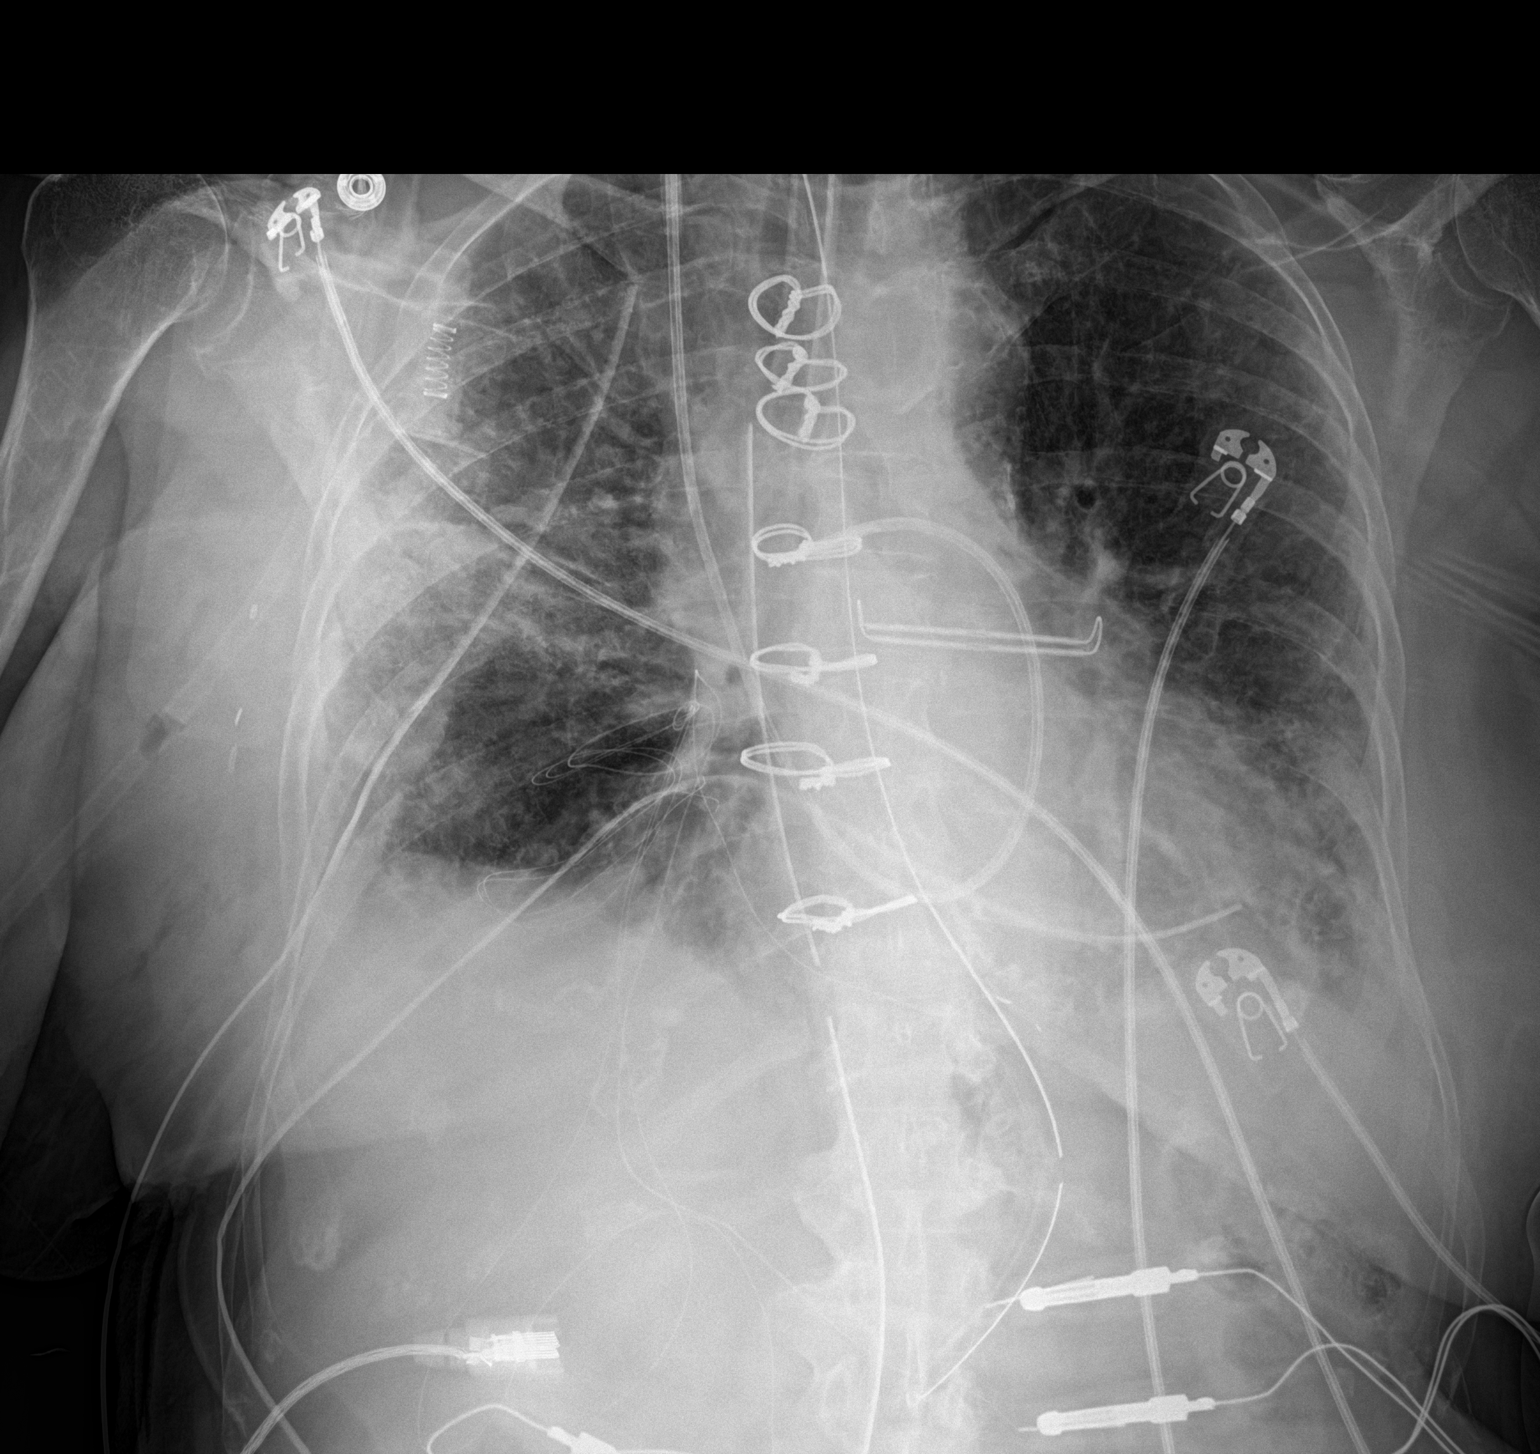

[1 of 1 positions shown; findings below may reference images not displayed]

FINDINGS: Status post median sternotomy for atrial clipping. Mild
cardiomegaly. Calcified aortic arch. Patchy RIGHT mid lung zone
airspace opacity with diffuse interstitial prominence with small
pleural effusions. No pneumothorax. Hiatal hernia better seen on
prior CT.

Endotracheal tube tip projects 15 mm above the carina. Swan-Ganz
catheter via RIGHT internal jugular venous approach with distal tip
projecting main pulmonary artery. Two RIGHT chest tubes and
mediastinal drain. Nasogastric tube tip projects in mid stomach.
Surgical clips mid to distal stomach and subcutaneous gas RIGHT
chest wall.
IMPRESSION: 1. Interval median sternotomy and atrial clipping.
2. Interstitial prominence, potential pulmonary edema with RIGHT mid
lung zone alveolar airspace opacity and small pleural effusions.
3. Endotracheal tube tip projects 15 mm above the carina. Swan-Ganz
catheter via RIGHT internal jugular venous approach with distal tip
projecting main pulmonary artery. Two RIGHT chest tubes and
mediastinal drain. Nasogastric tube tip projects in mid stomach.

## 2020-06-23 NOTE — Progress Notes (Signed)
Cardiology Office Note   Date:  06/23/2020   ID:  Wanda Nelson, DOB 1931/01/27, MRN [id number]  PCP:  McLean-Scocuzza, Nino Glow, MD  Cardiologist:   [name], MD   Chief Complaint  Patient presents with   Other    BP and HR concerns. Meds reviewed verbally with pt.       History of Present Illness: Wanda Nelson is a 87 y.o. female who presents for a follow-up visit post atrial myxoma resection. She has known history of anemia, hiatal hernia, hyperlipidemia and remote history of hemorrhagic stroke.  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  Cardiac catheterization before surgery showed no evidence of obstructive coronary artery disease.  She had intermittent palpitations and tachycardia shortly after surgery due to short runs of nonsustained ventricular tachycardia and intermittent A. fib with RVR. She did not require treatment for this and her symptoms resolved without intervention.  Echocardiogram in December 2020 showed normal LV systolic function with no significant valvular abnormalities.  There was a concern about possible recurrent atrial mass.  However, it appeared to be prominent scarring at the surgical site.  Most recent echocardiogram in June 2021showed no evidence of mass.  EF was 50 to 55%.  She went in May on a group trip to [place] and acquired COVID-19 infection.  She had shortness of breath and fluctuating heart rate since then.  She tested positive on May 23.  She feels better now but she did notice that her blood pressure was increasing and also she had fluctuating heart rate.  She denies any dizziness or presyncope.  She continues to feel dyspneic but denies chest pain.  She is usually very active but she noticed that she is not able to walk as long as before.  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   Squamous cell carcinoma of skin 11/12/2019   L distal dorsum forearm - ED&C    Stroke [facility])     Past Surgical History:  Procedure Laterality Date   ABDOMINAL HYSTERECTOMY  1982   total   BUNIONECTOMY Bilateral    CLIPPING OF ATRIAL APPENDAGE  03/21/2018   Procedure: Clipping Of Atrial Appendage - using an AtriCure 44mm clip;  Surgeon: Rexene Alberts, MD;  Location: [facility] OR;  Service: Open Heart Surgery;;   EXCISION OF ATRIAL MYXOMA N/A 03/21/2018   Procedure: Excision Of Atrial Myxoma;  Surgeon: Rexene Alberts, MD;  Location: [place];  Service: Open Heart Surgery;  Laterality: N/A;   RECTOCELE REPAIR     RIGHT/LEFT HEART CATH AND CORONARY ANGIOGRAPHY N/A 03/19/2018   Procedure: RIGHT/LEFT HEART CATH AND CORONARY ANGIOGRAPHY;  Surgeon: Wellington Hampshire, MD;  Location: [place] CV LAB;  Service: Cardiovascular;  Laterality: N/A;   TEE WITHOUT CARDIOVERSION N/A 03/21/2018   Procedure: TRANSESOPHAGEAL ECHOCARDIOGRAM (TEE);  Surgeon: Rexene Alberts, MD;  Location: [place];  Service: Open Heart Surgery;  Laterality: N/A;   TONSILLECTOMY       Current Outpatient  Medications  Medication Sig Dispense Refill   amLODipine (NORVASC) 2.5 MG tablet Take 1 tablet (2.5 mg total) by mouth daily. 90 tablet 3   Ascorbic Acid (VITAMIN C PO) Take by mouth daily at 12 noon.     aspirin EC 81 MG tablet Take 1 tablet (81 mg total) by mouth daily. 90 tablet 3   atorvastatin (LIPITOR) 20 MG tablet Take 1 tablet (20 mg total) by mouth daily at 6 PM. 90 tablet 3   Calcium Carbonate-Vitamin D 500-125 MG-UNIT TABS Take 1 tablet by mouth daily.     cholecalciferol (VITAMIN D) 1000 units tablet Take 1,000 Units by mouth daily.     ferrous sulfate (FEROSUL) 325 (65 FE) MG  tablet Take 1 tablet (325 mg total) by mouth daily with breakfast. With vitamin C 500 mg daily 90 tablet 3   Flaxseed, Linseed, (FLAX SEED OIL) 1300 MG CAPS Take 1,100 mg by mouth daily.      Multiple Vitamin (MULTIVITAMIN) tablet Take 1 tablet by mouth daily.     Omega-3 Fatty Acids (FISH OIL) 1200 MG CAPS Take by mouth daily at 8 pm.     omeprazole (PRILOSEC) 20 MG capsule Take 1 capsule (20 mg total) by mouth daily. 30 minutes before breakfast prn 90 capsule 3   scopolamine (TRANSDERM-SCOP) 1 MG/3DAYS Place 1 patch (1.5 mg total) onto the skin every 3 (three) days. 30 minutes to 4 hrs before travel 10 patch 0   No current facility-administered medications for this visit.    Allergies:   Bee venom, Codeine, Morphine and related, Shellfish allergy, Bactrim [sulfamethoxazole-trimethoprim], and Nitrofurantoin    Social History:  The patient  reports that she has never smoked. She has never used smokeless tobacco. She reports that she does not drink alcohol and does not use drugs.   Family History:  The patient's family history includes Cancer in her brother; Colon cancer in her brother, maternal grandmother, and maternal uncle; Coronary artery disease in her brother; Esophageal varices in her brother; Kidney disease in her brother.    ROS:  Please see the history of present illness.   Otherwise, review of systems are positive for none.   All other systems are reviewed and negative.    PHYSICAL EXAM: VS:  BP 130/60 (BP Location: Left Arm, Patient Position: Sitting, Cuff Size: Normal)   Pulse 62   Ht 5\' 2"  (1.575 m)   Wt 120 lb 8 oz (54.7 kg)   LMP  (LMP Unknown)   SpO2 98%   BMI 22.04 kg/m  , BMI Body mass index is 22.04 kg/m. GEN: Well nourished, well developed, in no acute distress  HEENT: normal  Neck: no JVD, carotid bruits, or masses Cardiac: RRR; no murmurs, rubs, or gallops,no edema  Respiratory:  clear to auscultation bilaterally, normal work of breathing GI: soft,  nontender, nondistended, + BS MS: no deformity or atrophy  Skin: warm and dry, no rash Neuro:  Strength and sensation are intact Psych: euthymic mood, full affect   EKG:  EKG is ordered today. The ekg ordered today demonstrates sinus rhythm with short PR.  Poor R wave progression in the anterior leads and nonspecific ST changes.   Recent Labs: 11/05/2019: TSH 1.91 05/13/2020: ALT 22; BUN 18; Creatinine, Ser 0.83; Hemoglobin 14.8; Platelets 150.0; Potassium 4.0; Sodium 139    Lipid Panel    Component Value Date/Time   CHOL 167 05/13/2020 0924   TRIG 103.0 05/13/2020 0924   HDL 59.70 05/13/2020 0924  CHOLHDL 3 05/13/2020 0924   VLDL 20.6 05/13/2020 0924   LDLCALC 87 05/13/2020 0924      Wt Readings from Last 3 Encounters:  06/23/20 120 lb 8 oz (54.7 kg)  06/15/20 118 lb 12.8 oz (53.9 kg)  05/31/20 115 lb (52.2 kg)      No flowsheet data found.    ASSESSMENT AND PLAN:  1.  Status post surgical resection of atrial myxoma: Given recurrent symptoms of shortness of breath, I requested an echocardiogram to ensure no recurrence.  2.  Postoperative atrial fibrillation: No evidence of recurrent arrhythmia.  She had recent fluctuations in heart rate but no significant bradycardia or tachycardia.  Her heart rate does not go below 50.  Continue to monitor symptoms for now.  3.  Hyperlipidemia: I reviewed her most recent lipid profile which was done last month.  It showed an LDL of 87 and a triglyceride of 103.  I think target LDL of less than 100 is reasonable and thus I asked her to continue taking same dose of atorvastatin.  4.  Recent COVID-19 infection: Given persistent shortness of breath, I requested an echocardiogram to ensure no cardiomyopathy or pericardial disease.  5.  Essential hypertension: Blood pressure improved with addition of amlodipine.     Disposition:   FU with me in 6 months  Signed,  [name], MD  06/23/2020 3:39 PM    [place]

## 2020-06-23 NOTE — Patient Instructions (Signed)
Medication Instructions:   Your physician recommends that you continue on your current medications as directed. Please refer to the Current Medication list given to you today.  *If you need a refill on your cardiac medications before your next appointment, please call your pharmacy*   Lab Work:  None Ordered  If you have labs (blood work) drawn today and your tests are completely normal, you will receive your results only by: [place] (if you have MyChart) OR A paper copy in the mail If you have any lab test that is abnormal or we need to change your treatment, we will call you to review the results.   Testing/Procedures:  Echocardiogram Please return to [facility] on ______________ at _______________ AM/PM for an Echocardiogram. Your physician has requested that you have an echocardiogram. Echocardiography is a painless test that uses sound waves to create images of your heart. It provides your doctor with information about the size and shape of your heart and how well your heart's chambers and valves are working. This procedure takes approximately one hour. There are no restrictions for this procedure. Please note; depending on visual quality an IV may need to be placed.     Follow-Up: At [facility], you and your health needs are our priority.  As part of our continuing mission to provide you with exceptional heart care, we have created designated Provider Care Teams.  These Care Teams include your primary Cardiologist (physician) and Advanced Practice Providers (APPs -  Physician Assistants and Nurse Practitioners) who all work together to provide you with the care you need, when you need it.  We recommend signing up for the patient portal called "MyChart".  Sign up information is provided on this After Visit Summary.  MyChart is used to connect with patients for Virtual Visits (Telemedicine).  Patients are able to view lab/test results, encounter notes,  upcoming appointments, etc.  Non-urgent messages can be sent to your provider as well.   To learn more about what you can do with MyChart, go to [email or web address].    Your next appointment:   6 month(s)  The format for your next appointment:   In Person  Provider:   You may see [name], MD or one of the following Advanced Practice Providers on your designated Care Team:   Murray Hodgkins, NP Christell Faith, PA-C Marrianne Mood, PA-C Cadence Glendale, [place] Laurann Montana, NP

## 2020-06-25 IMAGING — DX PORTABLE CHEST - 1 VIEW
1 series · 1 of 1 positions shown · non-contrast
Comparison: 03/22/2018

CLINICAL DATA: Myxoma

EXAM:
PORTABLE CHEST 1 VIEW

[chest ap]
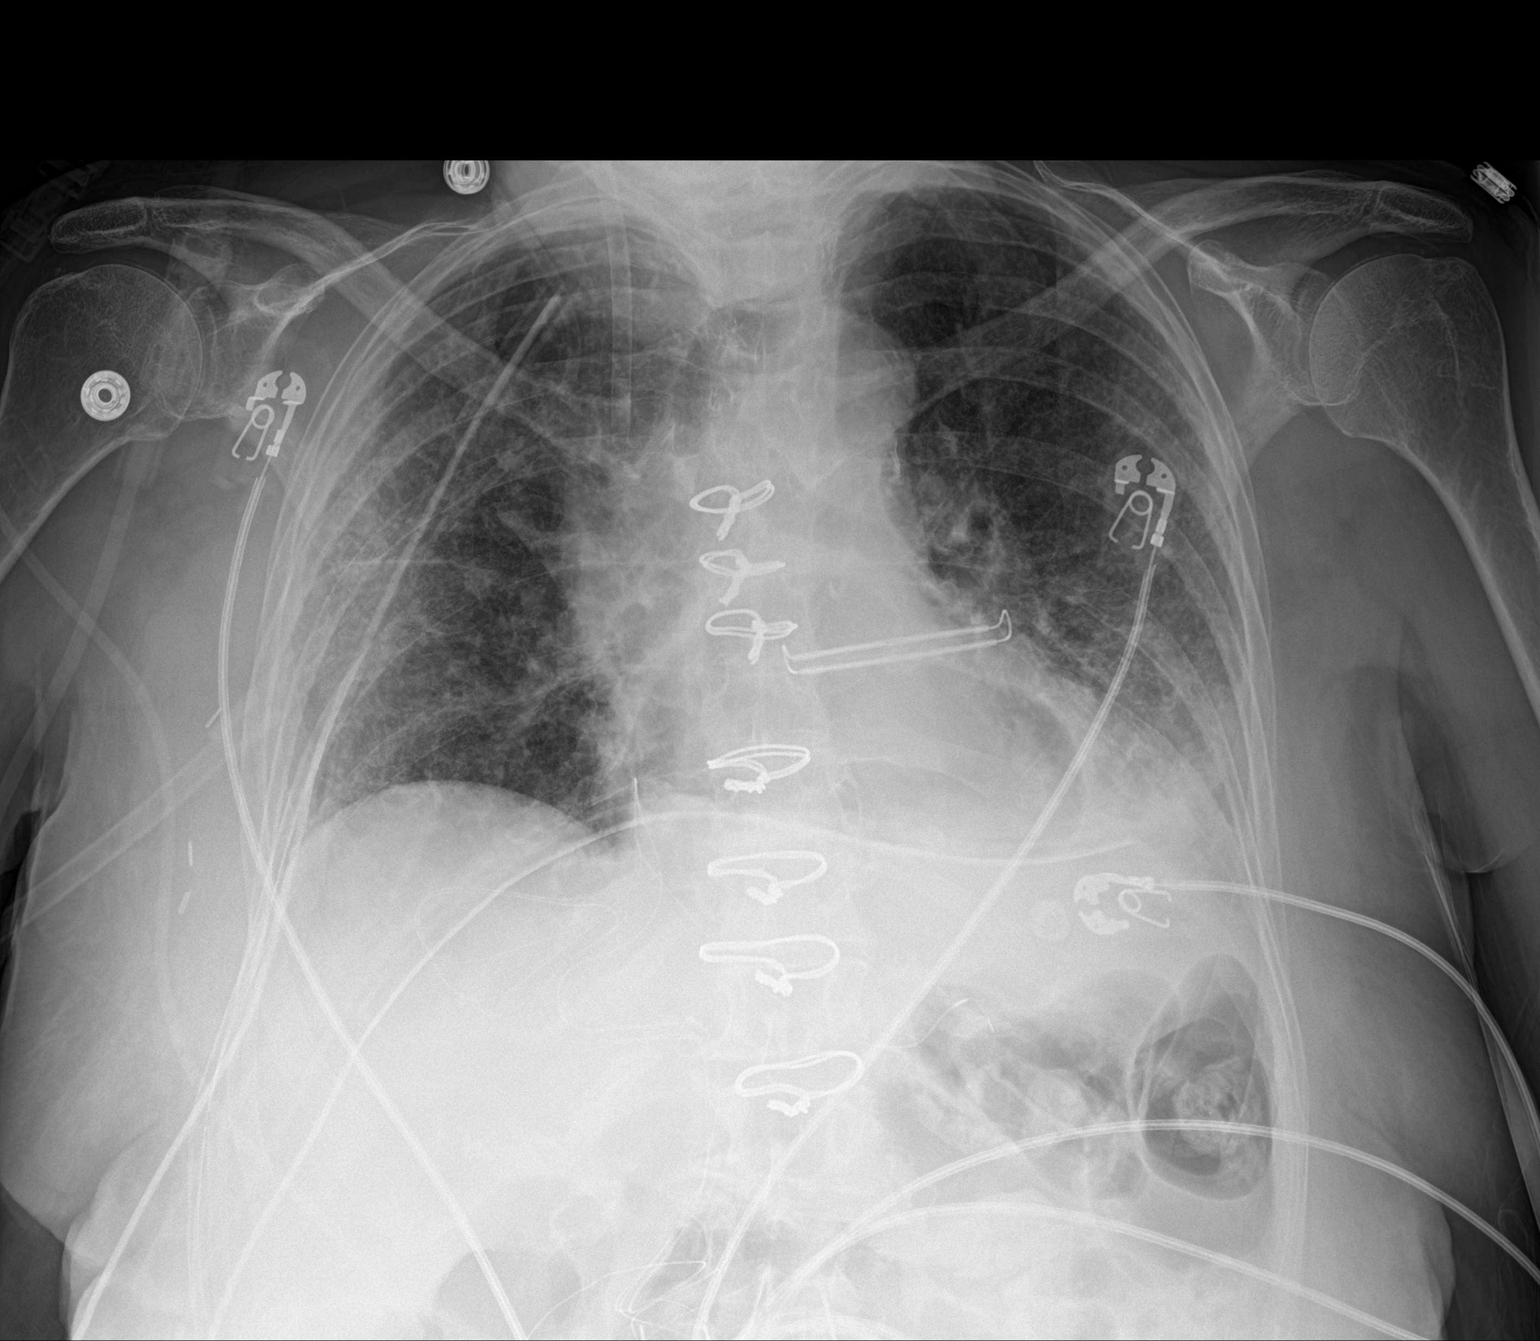

[1 of 1 positions shown; findings below may reference images not displayed]

FINDINGS: Interval extubation, removal of enteric tube, and removal of right
IJ Swan-Ganz catheter.

Stable right IJ venous sheath. Stable right apical chest tube.
Stable left basilar chest tube. Interval removal of mediastinal
drain.

No pneumothorax is seen.

Mild patchy opacities in the lateral right lung and lingula/left
lower lobe. Possible small left pleural effusion.

The heart is normal in size.

Moderate hiatal hernia.

Median sternotomy.
IMPRESSION: Interval extubation.

No pneumothorax is seen.

## 2020-07-06 ENCOUNTER — Encounter: Payer: Self-pay | Admitting: Dermatology

## 2020-07-06 ENCOUNTER — Ambulatory Visit (INDEPENDENT_AMBULATORY_CARE_PROVIDER_SITE_OTHER): Payer: Medicare Other | Admitting: Dermatology

## 2020-07-06 ENCOUNTER — Other Ambulatory Visit: Payer: Self-pay

## 2020-07-06 DIAGNOSIS — L820 Inflamed seborrheic keratosis: Secondary | ICD-10-CM

## 2020-07-06 DIAGNOSIS — L821 Other seborrheic keratosis: Secondary | ICD-10-CM | POA: Diagnosis not present

## 2020-07-06 DIAGNOSIS — L814 Other melanin hyperpigmentation: Secondary | ICD-10-CM

## 2020-07-06 NOTE — Progress Notes (Signed)
   Follow-Up Visit   Subjective  Wanda Nelson is a 87 y.o. female who presents for the following: lesion (Left wrist. Dur: 3-4 days. Patient c/o black spot. Does not itch or bleed, non tender. Started out as a scaly spot. Monday noticed was dark and larger. ).    The following portions of the chart were reviewed this encounter and updated as appropriate:      Review of Systems: No other skin or systemic complaints except as noted in HPI or Assessment and Plan.   Objective  Well appearing patient in no apparent distress; mood and affect are within normal limits.  A focused examination was performed including upper extremities, including the arms, hands, fingers, and fingernails. Relevant physical exam findings are noted in the Assessment and Plan.  Left Wrist - Posterior Erythematous keratotic or waxy stuck-on papule with overlying heme crusting  Assessment & Plan  Inflamed seborrheic keratosis Left Wrist - Posterior  Destruction of lesion - Left Wrist - Posterior  Destruction method: cryotherapy   Informed consent: discussed and consent obtained   Lesion destroyed using liquid nitrogen: Yes   Region frozen until ice ball extended beyond lesion: Yes   Outcome: patient tolerated procedure well with no complications   Post-procedure details: wound care instructions given   Additional details:  Prior to procedure, discussed risks of blister formation, small wound, skin dyspigmentation, or rare scar following cryotherapy. Recommend Vaseline ointment to treated areas while healing.   Seborrheic Keratoses - Stuck-on, waxy, tan-brown papules and/or plaques  - Benign-appearing - Discussed benign etiology and prognosis. - Observe - Call for any changes  Lentigines - Scattered tan macules - Due to sun exposure - Benign-appering, observe - Recommend daily broad spectrum sunscreen SPF 30+ to sun-exposed areas, reapply every 2 hours as needed. - Call for any changes      Return if symptoms worsen or fail to improve.  I, Emelia Salisbury, CMA, am acting as scribe for Brendolyn Patty, MD.  Documentation: I have reviewed the above documentation for accuracy and completeness, and I agree with the above.  Brendolyn Patty MD

## 2020-07-06 NOTE — Patient Instructions (Signed)
Cryotherapy  Cryotherapy is the treatment of lesions with the application of a cold substance.  In most cases, liquid nitrogen is used to destroy the lesion(s).  Liquid nitrogen is so cold, -196 Celsius, it feels like it is burning when it is applied.  After treatment with liquid nitrogen, there may be some burning sensation or pain that can last up to 24 hours.  The area may also be swollen and red.  The discomfort can be relieved with ibuprofen (Advil, Motrin), acetaminophen (Extra Strength Tylenol), or similar pain relief medication.  Within 24 to 48 hours, a blister may form.  Occasionally, these blisters will become filled with blood and become very dark.  This is no cause for concern.  The blister will gradually dry up over a period of several days, eventually separating from the healing skin below in about one to two weeks.  The surrounding skin will become red and the area may become itchy.  This is all part of the normal healing process.  Occasionally, the crusts will last as long as four weeks when certain deeper spots on the skin are treated.  Sometimes a permanent white mark or scar will be left after healing.  You may continue all of your normal activities as long as they do not cause pain in the treated areas.  It is okay to get the area wet.  After therapy or if the blister is still intact - You may treat it like normal skin.  Covering the area with a bandage may offer some comfort and protection from trauma but is not absolutely necessary.  If the blister is uncomfortable - Clean a small needle with rubbing alcohol, then gently make a small hole in the side of the blister to drain the blister fluid.  This often gives immediate relief.  Do not remove the blister roof, as the blister aids in healing.  In time it will fall off on its own.   Once the blister roof falls off or a sore forms -  Clean the blister sites with soap and water.  Rinse the area and pat dry.  Do not force off the  blister roof or crust. Apply an antibiotic ointment such as Polysporin or Bacitracin. A bandage may be applied loosely over the blister until it is healed if desired. Call our office if you are concerned it may be infected.  Some redness, itching and oozing is part of the normal healing process.  Signs of infection include increasing redness, increasing pain, swelling, heat, or yellow discharge.  If you have any questions or concerns for your doctor, please call our main line at [phone] and press option 4 to reach your doctor's medical assistant. If no one answers, please leave a voicemail as directed and we will return your call as soon as possible. Messages left after 4 pm will be answered the following business day.   You may also send [place] a message via [place]. We typically respond to MyChart messages within 1-2 business days.  For prescription refills, please ask your pharmacy to contact our office. Our fax number is [phone].  If you have an urgent issue when the clinic is closed that cannot wait until the next business day, you can page your doctor at the number below.    Please note that while we do our best to be available for urgent issues outside of office hours, we are not available 24/7.   If you have an urgent issue and are unable  to reach [place], you may choose to seek medical care at your doctor's office, retail clinic, urgent care center, or emergency room.  If you have a medical emergency, please immediately call 911 or go to the emergency department.  Pager Numbers  - Dr. Nehemiah Massed: [phone]  - Dr. Laurence Ferrari: [phone]  - Dr. Nicole Kindred: [phone]  In the event of inclement weather, please call our main line at [phone] for an update on the status of any delays or closures.  Dermatology Medication Tips: Please keep the boxes that topical medications come in in order to help keep track of the instructions about where and how to use these. Pharmacies typically  print the medication instructions only on the boxes and not directly on the medication tubes.   If your medication is too expensive, please contact our office at [phone] option 4 or send [place] a message through [place].   We are unable to tell what your co-pay for medications will be in advance as this is different depending on your insurance coverage. However, we may be able to find a substitute medication at lower cost or fill out paperwork to get insurance to cover a needed medication.   If a prior authorization is required to get your medication covered by your insurance company, please allow [place] 1-2 business days to complete this process.  Drug prices often vary depending on where the prescription is filled and some pharmacies may offer cheaper prices.  The website www.goodrx.com contains coupons for medications through different pharmacies. The prices here do not account for what the cost may be with help from insurance (it may be cheaper with your insurance), but the website can give you the price if you did not use any insurance.  - You can print the associated coupon and take it with your prescription to the pharmacy.  - You may also stop by our office during regular business hours and pick up a GoodRx coupon card.  - If you need your prescription sent electronically to a different pharmacy, notify our office through [facility] or by phone at [phone] option 4.

## 2020-07-19 ENCOUNTER — Encounter: Payer: Self-pay | Admitting: Internal Medicine

## 2020-07-20 ENCOUNTER — Other Ambulatory Visit: Payer: Self-pay | Admitting: Internal Medicine

## 2020-07-20 DIAGNOSIS — I10 Essential (primary) hypertension: Secondary | ICD-10-CM

## 2020-07-20 MED ORDER — LOSARTAN POTASSIUM 25 MG PO TABS: 12.5000 mg | ORAL_TABLET | Freq: Every day | ORAL | 3 refills | Status: DC

## 2020-07-22 ENCOUNTER — Other Ambulatory Visit: Payer: Self-pay

## 2020-07-22 ENCOUNTER — Ambulatory Visit (INDEPENDENT_AMBULATORY_CARE_PROVIDER_SITE_OTHER): Payer: Medicare Other

## 2020-07-22 DIAGNOSIS — R0602 Shortness of breath: Secondary | ICD-10-CM | POA: Diagnosis not present

## 2020-07-22 LAB — ECHOCARDIOGRAM COMPLETE
AR max vel: 2.03 cm2
AV Area VTI: 2.06 cm2
AV Area mean vel: 2 cm2
AV Mean grad: 3 mmHg
AV Peak grad: 5 mmHg
Ao pk vel: 1.12 m/s
Area-P 1/2: 4.15 cm2
S' Lateral: 2.9 cm
Single Plane A4C EF: 63.5 %

## 2020-07-28 ENCOUNTER — Encounter: Payer: Self-pay | Admitting: Podiatry

## 2020-07-28 ENCOUNTER — Ambulatory Visit (INDEPENDENT_AMBULATORY_CARE_PROVIDER_SITE_OTHER): Payer: Medicare Other | Admitting: Podiatry

## 2020-07-28 ENCOUNTER — Other Ambulatory Visit: Payer: Self-pay

## 2020-07-28 ENCOUNTER — Encounter: Payer: Self-pay | Admitting: Internal Medicine

## 2020-07-28 DIAGNOSIS — M79675 Pain in left toe(s): Secondary | ICD-10-CM | POA: Diagnosis not present

## 2020-07-28 DIAGNOSIS — B351 Tinea unguium: Secondary | ICD-10-CM | POA: Diagnosis not present

## 2020-07-28 DIAGNOSIS — M79674 Pain in right toe(s): Secondary | ICD-10-CM | POA: Diagnosis not present

## 2020-07-28 HISTORY — DX: Tinea unguium: B35.1

## 2020-07-28 NOTE — Progress Notes (Signed)
This patient returns to the office for evaluation and treatment of long thick painful nails .  This patient is unable to trim his own nails since the patient cannot reach his feet.  Patient says the nails are painful walking and wearing his shoes.  He returns for preventive foot care services.  General Appearance  Alert, conversant and in no acute stress.  Vascular  Dorsalis pedis and posterior tibial  pulses are palpable  bilaterally.  Capillary return is within normal limits  bilaterally. Temperature is within normal limits  bilaterally.  Neurologic  Senn-Weinstein monofilament wire test within normal limits  bilaterally. Muscle power within normal limits bilaterally.  Nails Thick disfigured discolored nails with subungual debris  from hallux to fifth toes bilaterally. No evidence of bacterial infection or drainage bilaterally.  Orthopedic  No limitations of motion  feet .  No crepitus or effusions noted.  No bony pathology or digital deformities noted.  Skin  normotropic skin with no porokeratosis noted bilaterally.  No signs of infections or ulcers noted.     Onychomycosis  Pain in toes right foot  Pain in toes left foot  Debridement  of nails  1-5  B/L with a nail nipper.  Nails were then filed using a dremel tool with no incidents.    RTC  3 months    Dolphus Linch DPM  

## 2020-08-24 ENCOUNTER — Telehealth: Payer: Self-pay | Admitting: Internal Medicine

## 2020-08-24 NOTE — Telephone Encounter (Signed)
Patient informed, Due to the high volume of calls and your symptoms we have to forward your call to our Triage Nurse to expedient your call. Please hold for the transfer.  Patient transferred to Access Nurse. Due to having coughing fits and shortness of breath. She was taking a zpak but is now out. Advise of no apts virtual or in office and transferred.

## 2020-08-25 ENCOUNTER — Other Ambulatory Visit: Payer: Self-pay

## 2020-08-25 ENCOUNTER — Ambulatory Visit
Admission: EM | Admit: 2020-08-25 | Discharge: 2020-08-25 | Disposition: A | Discharge status: Home / Self Care | Payer: Medicare Other | Attending: Emergency Medicine | Admitting: Emergency Medicine

## 2020-08-25 DIAGNOSIS — R0682 Tachypnea, not elsewhere classified: Secondary | ICD-10-CM

## 2020-08-25 DIAGNOSIS — R059 Cough, unspecified: Secondary | ICD-10-CM

## 2020-08-25 DIAGNOSIS — R0602 Shortness of breath: Secondary | ICD-10-CM | POA: Diagnosis not present

## 2020-08-25 MED ORDER — BENZONATATE 100 MG PO CAPS: 100.0000 mg | ORAL_CAPSULE | Freq: Three times a day (TID) | ORAL | 0 refills | Status: DC | PRN

## 2020-08-25 NOTE — Discharge Instructions (Addendum)
Go to the emergency department if you have shortness of breath or other concerning symptoms.  Take the prescribed Tessalon Perles as needed for cough.  Follow-up with your primary care provider today.

## 2020-08-25 NOTE — Telephone Encounter (Signed)
Patient was instructed by access nurse of home care and if patient needed to be seen she is to go to [place].

## 2020-08-25 NOTE — ED Triage Notes (Signed)
Pt presents with chest congestion, dry cough and SOB.  Denies CP. Had bronchitis about a week ago and completed a Zpak.  Was on a [place] ship and given Acetylcysteine by the clinic on the ship.  Pt not always able to complete sentences with pausing for a breath but states she sometimes has this at baseline d/t a hiatal hernia.   Pt states she has always had a decreased lung capacity - is unable to use inhalers or blow balloon due to not being able to take large breaths.

## 2020-08-25 NOTE — ED Provider Notes (Signed)
[name]    CSN: [id number] Arrival date & time: 08/25/20  [id number]      History   Chief Complaint Chief Complaint  Patient presents with   Shortness of Breath   Cough    HPI Wanda Nelson is a 87 y.o. female.  Patient presents with nonproductive cough, congestion, and shortness of breath x1 to 2 weeks.  She recently flew to [place] for a cruise; She flew back yesterday.  She denies fever, chills, chest pain, or other symptoms.  She took a Z-Pak for her symptoms 1 week ago; she had taken this with her on her vacation in case she developed bronchitis.  She states she always travels with Zithromax and Cipro because she frequently gets bronchitis and UTIs.  She also saw the medical provider on the cruise ship and was given an antihistamine.  Her medical history includes stroke.  The history is provided by the patient and medical records.   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   Squamous cell carcinoma of skin 11/12/2019   L [facility] dorsum [facility]    [facility]    Patient Active Problem List   Diagnosis Date Noted   Pain due to onychomycosis of toenails of both feet 07/28/2020   COVID-19 05/31/2020   Viral URI 05/31/2020   UTI (urinary tract infection) 10/04/2018   Iron deficiency anemia 07/31/2018   Hiatal hernia 07/31/2018   Gastroesophageal reflux disease 07/31/2018   Atherosclerosis 07/31/2018   Incidental pulmonary nodule, > 39m69mnd < 8mm52m/12/2018   s/p resection left atrial myxoma 03/19/2018   HLD (hyperlipidemia) 03/15/2015   History of stroke 03/15/2015   Glaucoma 03/15/2015    Past Surgical History:  Procedure  Laterality Date   ABDOMINAL HYSTERECTOMY  1982   total   BUNIONECTOMY Bilateral    CLIPPING OF ATRIAL APPENDAGE  03/21/2018   Procedure: Clipping Of Atrial Appendage - using an AtriCure 45mm39mp;  Surgeon: Owen,Rexene Alberts  [facility];;   EXCISION OF ATRIAL MYXOMA N/A 03/21/2018   Procedure: Excision Of Atrial Myxoma;  Surgeon: Owen,Rexene Alberts  [facility];  Laterality: N/A;   RECTOCELE REPAIR     RIGHT/LEFT HEART CATH AND CORONARY ANGIOGRAPHY N/A 03/19/2018   Procedure: RIGHT/LEFT HEART CATH AND CORONARY ANGIOGRAPHY;  Surgeon: AridaWellington [place]  [facility];  Service: Cardiovascular;  Laterality: N/A;   TEE WITHOUT CARDIOVERSION N/A 03/21/2018   Procedure: TRANSESOPHAGEAL ECHOCARDIOGRAM (TEE);  Surgeon: Owen,Rexene Alberts  [facility];  Laterality: N/A;   TONSILLECTOMY      OB History   No obstetric history on file.      Home Medications    Prior to Admission medications   Medication Sig Start Date End Date Taking? Authorizing Provider  benzonatate (TESSALON) 100 MG capsule Take 1 capsule (100 mg total) by mouth 3 (three) times daily as needed for cough. 08/25/20  Yes Devika Dragovich,Barkley Boards  H, NP  amLODipine (NORVASC) 2.5 MG tablet Take 1 tablet (2.5 mg total) by mouth daily. 06/15/20   McLean-Scocuzza, Nino Glow, MD  Ascorbic Acid (VITAMIN C PO) Take by mouth daily at 12 noon.    [provider]  aspirin EC 81 MG tablet Take 1 tablet (81 mg total) by mouth daily. 05/23/20   McLean-Scocuzza, Nino Glow, MD  atorvastatin (LIPITOR) 20 MG tablet Take 1 tablet (20 mg total) by mouth daily at 6 PM. 05/13/20   McLean-Scocuzza, Nino Glow, MD  Calcium Carbonate-Vitamin D 500-125 MG-UNIT TABS Take 1 tablet by mouth daily.    [provider]  cholecalciferol (VITAMIN D) 1000 units tablet Take 1,000 Units by mouth daily.    [provider]  ferrous sulfate  (FEROSUL) 325 (65 FE) MG tablet Take 1 tablet (325 mg total) by mouth daily with breakfast. With vitamin C 500 mg daily 05/23/20   McLean-Scocuzza, Nino Glow, MD  Flaxseed, Linseed, (FLAX SEED OIL) 1300 MG CAPS Take 1,100 mg by mouth daily.     [provider]  losartan (COZAAR) 25 MG tablet Take 0.5 tablets (12.5 mg total) by mouth daily. Dc norvasc 2.5 mg qd 07/20/20   McLean-Scocuzza, Nino Glow, MD  Multiple Vitamin (MULTIVITAMIN) tablet Take 1 tablet by mouth daily.    [provider]  Omega-3 Fatty Acids (FISH OIL) 1200 MG CAPS Take by mouth daily at 8 pm.    [provider]  omeprazole (PRILOSEC) 20 MG capsule Take 1 capsule (20 mg total) by mouth daily. 30 minutes before breakfast prn 05/13/20   McLean-Scocuzza, Nino Glow, MD  scopolamine (TRANSDERM-SCOP) 1 MG/3DAYS Place 1 patch (1.5 mg total) onto the skin every 3 (three) days. 30 minutes to 4 hrs before travel 05/13/20   McLean-Scocuzza, Nino Glow, MD    Family History Family History  Problem Relation Age of Onset   Colon cancer Brother    Cancer Brother        neck tumor cancerous    Esophageal varices Brother    Colon cancer Maternal Uncle    Colon cancer Maternal Grandmother    Kidney disease Brother    Coronary artery disease Brother        with stent, cabg   Breast cancer Neg Hx     Social History Social History   Tobacco Use   Smoking status: Never   Smokeless tobacco: Never  Vaping Use   Vaping Use: Never used  Substance Use Topics   Alcohol use: No    Alcohol/week: 0.0 standard drinks   Drug use: No     Allergies   Bee venom, Codeine, Morphine and related, Shellfish allergy, Bactrim [sulfamethoxazole-trimethoprim], Norvasc [amlodipine], and Nitrofurantoin   Review of Systems Review of Systems  Constitutional:  Negative for chills and fever.  HENT:  Positive for congestion. Negative for ear pain and sore throat.   Respiratory:  Positive for cough and shortness of breath.   Cardiovascular:   Negative for chest pain and palpitations.  Gastrointestinal:  Negative for abdominal pain, diarrhea and vomiting.  Skin:  Negative for color change and rash.  All other systems reviewed and are negative.   Physical Exam Triage Vital Signs ED Triage Vitals  Enc Vitals Group     BP      Pulse      Resp      Temp      Temp src      SpO2      Weight  Height      Head Circumference      Peak Flow      Pain Score      Pain Loc      Pain Edu?      Excl. in [place]?    No data found.  Updated Vital Signs BP (!) 151/82 (BP Location: Left Arm)   Pulse 72   Temp 98.1 F (36.7 C) (Oral)   Resp (!) 26   LMP  (LMP Unknown)   SpO2 96%   Visual Acuity Right Eye Distance:   Left Eye Distance:   Bilateral Distance:    Right Eye Near:   Left Eye Near:    Bilateral Near:     Physical Exam Vitals and nursing note reviewed.  Constitutional:      General: She is not in acute distress.    Appearance: She is well-developed.  HENT:     Head: Normocephalic and atraumatic.     Right Ear: Tympanic membrane normal.     Left Ear: Tympanic membrane normal.     Nose: Nose normal.     Mouth/Throat:     Mouth: Mucous membranes are moist.     Pharynx: Oropharynx is clear.  Eyes:     Conjunctiva/sclera: Conjunctivae normal.  Cardiovascular:     Rate and Rhythm: Normal rate and regular rhythm.     Heart sounds: No murmur heard. Pulmonary:     Effort: Pulmonary effort is normal. No respiratory distress.     Breath sounds: Normal breath sounds.     Comments: Tachypnic Abdominal:     Palpations: Abdomen is soft.     Tenderness: There is no abdominal tenderness.  Musculoskeletal:     Cervical back: Neck supple.  Skin:    General: Skin is warm and dry.  Neurological:     General: No focal deficit present.     Mental Status: She is alert and oriented to person, place, and time.     Gait: Gait normal.  Psychiatric:        Mood and Affect: Mood normal.        Behavior: Behavior  normal.     UC Treatments / Results  Labs (all labs ordered are listed, but only abnormal results are displayed) Labs Reviewed - No data to display  EKG   Radiology No results found.  Procedures Procedures (including critical care time)  Medications Ordered in UC Medications - No data to display  Initial Impression / Assessment and Plan / UC Course  I have reviewed the triage vital signs and the nursing notes.  Pertinent labs & imaging results that were available during my care of the patient were reviewed by me and considered in my medical decision making (see chart for details).  Cough, shortness of breath, tachypnea.  Patient declined transfer to the ED.  She declines chest CT or chest x-ray.  I discussed the possibility of pulmonary embolism due to her recent long flight from [place], her age, and her medical history.  I discussed that this can be life threatening and result in death.  She still declines transfer to the ED.  She states she just wants some cough medication.  She had a prescription for Tessalon Perles at home but it had expired in 2019.  She would like a new prescription for [employer] and I prescribed this for her today.  Strict ED precautions discussed and instructed patient to follow-up with her PCP today.  She agrees to this.  Final Clinical Impressions(s) / UC Diagnoses   Final diagnoses:  Cough  SOB (shortness of breath)  Tachypnea     Discharge Instructions      Go to the emergency department if you have shortness of breath or other concerning symptoms.  Take the prescribed Tessalon Perles as needed for cough.  Follow-up with your primary care provider today.     ED Prescriptions     Medication Sig Dispense Auth. Provider   benzonatate (TESSALON) 100 MG capsule Take 1 capsule (100 mg total) by mouth 3 (three) times daily as needed for cough. 21 capsule Sharion Balloon, NP      PDMP not reviewed this encounter.   Sharion Balloon,  NP 08/25/20 1015

## 2020-08-26 ENCOUNTER — Encounter: Payer: Self-pay | Admitting: Internal Medicine

## 2020-08-31 ENCOUNTER — Other Ambulatory Visit: Payer: Self-pay

## 2020-08-31 ENCOUNTER — Encounter: Payer: Self-pay | Admitting: Family

## 2020-08-31 ENCOUNTER — Ambulatory Visit (INDEPENDENT_AMBULATORY_CARE_PROVIDER_SITE_OTHER): Payer: Medicare Other | Admitting: Family

## 2020-08-31 ENCOUNTER — Telehealth: Payer: Self-pay | Admitting: Family

## 2020-08-31 ENCOUNTER — Emergency Department: Payer: Medicare Other

## 2020-08-31 ENCOUNTER — Other Ambulatory Visit
Admission: RE | Admit: 2020-08-31 | Discharge: 2020-08-31 | Disposition: A | Discharge status: Home / Self Care | Payer: Medicare Other | Source: Home / Self Care | Attending: Family | Admitting: Family

## 2020-08-31 ENCOUNTER — Ambulatory Visit (INDEPENDENT_AMBULATORY_CARE_PROVIDER_SITE_OTHER): Payer: Medicare Other

## 2020-08-31 ENCOUNTER — Emergency Department
Admission: EM | Admit: 2020-08-31 | Discharge: 2020-08-31 | Disposition: A | Discharge status: Home / Self Care | Payer: Medicare Other | Attending: Student in an Organized Health Care Education/Training Program | Admitting: Student in an Organized Health Care Education/Training Program

## 2020-08-31 VITALS — BP 130/88 | HR 92 | Temp 96.0°F | Ht 62.01 in | Wt 116.0 lb

## 2020-08-31 DIAGNOSIS — R7989 Other specified abnormal findings of blood chemistry: Secondary | ICD-10-CM | POA: Diagnosis not present

## 2020-08-31 DIAGNOSIS — R0602 Shortness of breath: Secondary | ICD-10-CM | POA: Insufficient documentation

## 2020-08-31 DIAGNOSIS — Z7982 Long term (current) use of aspirin: Secondary | ICD-10-CM | POA: Insufficient documentation

## 2020-08-31 DIAGNOSIS — U071 COVID-19: Secondary | ICD-10-CM | POA: Diagnosis not present

## 2020-08-31 DIAGNOSIS — Z8616 Personal history of COVID-19: Secondary | ICD-10-CM | POA: Insufficient documentation

## 2020-08-31 DIAGNOSIS — K449 Diaphragmatic hernia without obstruction or gangrene: Secondary | ICD-10-CM | POA: Diagnosis not present

## 2020-08-31 DIAGNOSIS — R791 Abnormal coagulation profile: Secondary | ICD-10-CM | POA: Insufficient documentation

## 2020-08-31 DIAGNOSIS — R059 Cough, unspecified: Secondary | ICD-10-CM | POA: Diagnosis not present

## 2020-08-31 DIAGNOSIS — Z955 Presence of coronary angioplasty implant and graft: Secondary | ICD-10-CM | POA: Diagnosis not present

## 2020-08-31 DIAGNOSIS — R0609 Other forms of dyspnea: Secondary | ICD-10-CM | POA: Diagnosis not present

## 2020-08-31 DIAGNOSIS — Z85828 Personal history of other malignant neoplasm of skin: Secondary | ICD-10-CM | POA: Insufficient documentation

## 2020-08-31 DIAGNOSIS — R0600 Dyspnea, unspecified: Secondary | ICD-10-CM | POA: Diagnosis not present

## 2020-08-31 LAB — CBC WITH DIFFERENTIAL/PLATELET
Abs Immature Granulocytes: 0.04 10*3/uL (ref 0.00–0.07)
Basophils Absolute: 0.1 10*3/uL (ref 0.0–0.1)
Basophils Relative: 1 %
Eosinophils Absolute: 0.3 10*3/uL (ref 0.0–0.5)
Eosinophils Relative: 3 %
HCT: 44.1 % (ref 36.0–46.0)
Hemoglobin: 14.9 g/dL (ref 12.0–15.0)
Immature Granulocytes: 1 %
Lymphocytes Relative: 24 %
Lymphs Abs: 1.9 10*3/uL (ref 0.7–4.0)
MCH: 32.7 pg (ref 26.0–34.0)
MCHC: 33.8 g/dL (ref 30.0–36.0)
MCV: 96.9 fL (ref 80.0–100.0)
Monocytes Absolute: 0.6 10*3/uL (ref 0.1–1.0)
Monocytes Relative: 7 %
Neutro Abs: 5.2 10*3/uL (ref 1.7–7.7)
Neutrophils Relative %: 64 %
Platelets: 357 10*3/uL (ref 150–400)
RBC: 4.55 MIL/uL (ref 3.87–5.11)
RDW: 12.8 % (ref 11.5–15.5)
WBC: 8 10*3/uL (ref 4.0–10.5)
nRBC: 0 % (ref 0.0–0.2)

## 2020-08-31 LAB — COMPREHENSIVE METABOLIC PANEL
ALT: 25 U/L (ref 0–44)
AST: 19 U/L (ref 15–41)
Albumin: 3.5 g/dL (ref 3.5–5.0)
Alkaline Phosphatase: 128 U/L — ABNORMAL HIGH (ref 38–126)
Anion gap: 10 (ref 5–15)
BUN: 16 mg/dL (ref 8–23)
CO2: 28 mmol/L (ref 22–32)
Calcium: 9.7 mg/dL (ref 8.9–10.3)
Chloride: 100 mmol/L (ref 98–111)
Creatinine, Ser: 0.94 mg/dL (ref 0.44–1.00)
GFR, Estimated: 59 mL/min — ABNORMAL LOW (ref >60)
Glucose, Bld: 96 mg/dL (ref 70–99)
Potassium: 4.3 mmol/L (ref 3.5–5.1)
Sodium: 138 mmol/L (ref 135–145)
Total Bilirubin: 0.9 mg/dL (ref 0.3–1.2)
Total Protein: 8.2 g/dL — ABNORMAL HIGH (ref 6.5–8.1)

## 2020-08-31 LAB — D-DIMER, QUANTITATIVE: D-Dimer, Quant: 0.78 ug/mL-FEU — ABNORMAL HIGH (ref 0.00–0.50)

## 2020-08-31 MED ORDER — IOHEXOL 350 MG/ML SOLN
75.0000 mL | Freq: Once | INTRAVENOUS | Status: AC | PRN
  Administered 2020-08-31: 75 mL via INTRAVENOUS

## 2020-08-31 NOTE — Patient Instructions (Addendum)
Referral to pulmonology  Let us know if you dont hear back within a week in regards to an appointment being scheduled.   we have done your x-ray of your chest here.  I would like for you to have a D-dimer with other labs to look for anemia at [facility].    Please go immediately this morning as I have ordered the D dimer  stat .  Please let me know how you are doing and if shortness of breath worsens in any way

## 2020-08-31 NOTE — Progress Notes (Signed)
Subjective:    Patient ID: Wanda Nelson, female    DOB: [birth year], 87 y.o.   MRN: [id number]  CC: Wanda Nelson is a 87 y.o. female who presents today for follow up.   HPI: Complains of SOB, dry cough 2 weeks, improving.  She walked this morning for 45 miles without difficulty.   Chronic sob however worse after the past 2 weeks. SOB was not exacerbated with covid 3 months ago.   She was seen in urgent care 08/25/20 for cough, congestion and shortness of breath.    She has traveled via cruise [place], [place] and returned one week ago. Landed 08/17/20 and started zpak at that time. She was worried that she developed 'bronchitis' after flight.  reports being seen a medical provider on cruise  and given a 'powder' for cough which was helpful.  She was prescribed a Z-Pak in the last month by PCP ahead of travel.   No recent history immobilization, treatment for malignancy within the last 6 months, history of DVT/PE.  No leg swelling  She is compliant with ferrous sulfate '350mg'$  daily.   Urgent care provider had concerns for pulmonary embolism in the setting of long flight, age.  Patient declined CXR and transfer to emergency room.  She wanted cough medication and was provided a refill for [employer]  Patient has history of iron deficient anemia, stroke  Hypertension-she is no longer on amlodipine 2.5 mg; compliant with losartan 12.5 mg.   Blood pressure readings at home 24/73, 134/46, 130/57, 121/64, 125/65.  Heart rate ranged from 51-78.  H/o covid 19 05/30/20  She lives at [facility]    Consult with Dr Ashby Dawes 02/2018 for dysnea.Started breo. Patient reports she is unable to use inhaler and she tried [place].   She also notes difficultly with doing nebulizer.    Echocardiogram 07/2020.  Read by Dr Fletcher Anon with normal ejection fraction with no significant lobular abnormalities.  No evidence of recurrent atrial myxoma. Noted mild pulmonary hypertension but not  significant enough to require treat Last seen by cardiology, Dr Fletcher Anon 06/23/2020 for dyspnea.  At time of visit she noticed that she was unable to walk as long as before Postoperative atrial fibrillation without evidence of recurrent arrhythmia.  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/21/2018   Squamous cell carcinoma of skin 11/12/2019   L distal dorsum forearm - ED&C    [facility]   Past Surgical History:  Procedure Laterality Date   ABDOMINAL HYSTERECTOMY  1982   total   BUNIONECTOMY Bilateral    CLIPPING OF ATRIAL APPENDAGE  03/21/2018   Procedure: Clipping Of Atrial Appendage - using an AtriCure 4566mlip;  Surgeon: OweRexene AlbertsD;  [facility];  Service: Open Heart Surgery;;   EXCISION OF ATRIAL MYXOMA N/A 03/21/2018   Procedure: Excision Of Atrial Myxoma;  Surgeon: OweRexene AlbertsD;  [facility] [place] [facility];  Laterality: N/A;   RECTOCELE REPAIR     RIGHT/LEFT HEART CATH AND CORONARY ANGIOGRAPHY N/A 03/19/2018  Procedure: RIGHT/LEFT HEART CATH AND CORONARY ANGIOGRAPHY;  Surgeon: Wellington Hampshire, MD;  Location: [place] [facility];  Service: Cardiovascular;  Laterality: N/A;   TEE WITHOUT CARDIOVERSION N/A 03/21/2018   Procedure: TRANSESOPHAGEAL ECHOCARDIOGRAM (TEE);  Surgeon: Rexene Alberts, MD;  Location: [place];  Service: Open Heart Surgery;  Laterality: N/A;   TONSILLECTOMY     Family History  Problem Relation Age of Onset   Colon cancer Brother    Cancer Brother        neck tumor cancerous    Esophageal varices Brother    Colon cancer Maternal Uncle    Colon cancer Maternal Grandmother    Kidney disease Brother     Coronary artery disease Brother        with stent, cabg   Breast cancer Neg Hx     Allergies: Bee venom, Codeine, Morphine and related, Shellfish allergy, Bactrim [sulfamethoxazole-trimethoprim], Norvasc [amlodipine], and Nitrofurantoin Current Outpatient Medications on File Prior to Visit  Medication Sig Dispense Refill   amLODipine (NORVASC) 2.5 MG tablet Take 1 tablet (2.5 mg total) by mouth daily. 90 tablet 3   Ascorbic Acid (VITAMIN C PO) Take by mouth daily at 12 noon.     aspirin EC 81 MG tablet Take 1 tablet (81 mg total) by mouth daily. 90 tablet 3   atorvastatin (LIPITOR) 20 MG tablet Take 1 tablet (20 mg total) by mouth daily at 6 PM. 90 tablet 3   benzonatate (TESSALON) 100 MG capsule Take 1 capsule (100 mg total) by mouth 3 (three) times daily as needed for cough. 21 capsule 0   Calcium Carbonate-Vitamin D 500-125 MG-UNIT TABS Take 1 tablet by mouth daily.     cholecalciferol (VITAMIN D) 1000 units tablet Take 1,000 Units by mouth daily.     ferrous sulfate (FEROSUL) 325 (65 FE) MG tablet Take 1 tablet (325 mg total) by mouth daily with breakfast. With vitamin C 500 mg daily 90 tablet 3   Flaxseed, Linseed, (FLAX SEED OIL) 1300 MG CAPS Take 1,100 mg by mouth daily.      losartan (COZAAR) 25 MG tablet Take 0.5 tablets (12.5 mg total) by mouth daily. Dc norvasc 2.5 mg qd 45 tablet 3   Multiple Vitamin (MULTIVITAMIN) tablet Take 1 tablet by mouth daily.     Omega-3 Fatty Acids (FISH OIL) 1200 MG CAPS Take by mouth daily at 8 pm.     omeprazole (PRILOSEC) 20 MG capsule Take 1 capsule (20 mg total) by mouth daily. 30 minutes before breakfast prn 90 capsule 3   scopolamine (TRANSDERM-SCOP) 1 MG/3DAYS Place 1 patch (1.5 mg total) onto the skin every 3 (three) days. 30 minutes to 4 hrs before travel 10 patch 0   No current facility-administered medications on file prior to visit.    Social History   Tobacco Use   Smoking status: Never   Smokeless tobacco: Never  Vaping Use    Vaping Use: Never used  Substance Use Topics   Alcohol use: No    Alcohol/week: 0.0 standard drinks   Drug use: No    Review of Systems  Constitutional:  Positive for fatigue. Negative for chills and fever.  Respiratory:  Positive for cough and shortness of breath.   Cardiovascular:  Negative for chest pain, palpitations and leg swelling.  Gastrointestinal:  Negative for nausea and vomiting.     Objective:    BP 130/88   Pulse 92   Temp (!) 96 F (35.6 C) Comment: temporal  Ht  5' 2.01" (1.575 m)   Wt 116 lb (52.6 kg)   LMP  (LMP Unknown)   SpO2 95%   BMI 21.21 kg/m  BP Readings from Last 3 Encounters:  08/31/20 130/88  08/25/20 (!) 151/82  06/23/20 130/60   Wt Readings from Last 3 Encounters:  08/31/20 116 lb (52.6 kg)  06/23/20 120 lb 8 oz (54.7 kg)  06/15/20 118 lb 12.8 oz (53.9 kg)    Physical Exam Vitals reviewed.  Constitutional:      Appearance: She is well-developed.  Eyes:     Conjunctiva/sclera: Conjunctivae normal.  Cardiovascular:     Rate and Rhythm: Normal rate and regular rhythm.     Pulses: Normal pulses.     Heart sounds: Normal heart sounds.  Pulmonary:     Effort: Pulmonary effort is normal.     Breath sounds: Normal breath sounds. No wheezing, rhonchi or rales.  Musculoskeletal:     Right lower leg: No edema.     Left lower leg: No edema.  Skin:    General: Skin is warm and dry.  Neurological:     Mental Status: She is alert.  Psychiatric:        Speech: Speech normal.        Behavior: Behavior normal.        Thought Content: Thought content normal.       Assessment & Plan:   Problem List Items Addressed This Visit       Other   SOB (shortness of breath) - Primary    Improved. Walked with patient down hallway and she was not labored in speech. Sa02 remained  96% and HR 86. She was talking freely.  EKG showed SR, no ischemia and no significant changes from prior EKG with Dr Fletcher Anon 06/23/20. Wells criteria PE low risk. Case reviewed  with supervising, Dr Deborra Medina, and she and I jointly agreed to order stat D dimer after patient's long trip overseas. If negative, would feel ressured that no PE. D dimer returned positive and I reviewed this finding with patient on the phone and daughter [name]; I explained my concern that this could mean that she has a pulmonary embolism.  I advised to go [facility] today to be evaluated for this to be ruled out.  Patient verbalized understanding and she was in the car with a friend driving her to [facility] . I also discussed her chest x-ray showed interstitial lung disease.  She was aware of this diagnosis. Report  given to [facility] at [facility] ED.  Referral has been placed to pulmonology. Will follow.       Relevant Orders   EKG 12-Lead (Completed)   Comprehensive metabolic panel (Completed)   CBC with Differential/Platelet (Completed)   DG Chest 2 View (Completed)   D-Dimer, Quantitative (Completed)   Ambulatory referral to Pulmonology     I am having Danyel P. Nelson maintain her multivitamin, Flax Seed Oil, Calcium Carbonate-Vitamin D, cholecalciferol, Ascorbic Acid (VITAMIN C PO), scopolamine, atorvastatin, omeprazole, aspirin EC, ferrous sulfate, amLODipine, Fish Oil, losartan, and benzonatate.   No orders of the defined types were placed in this encounter.   Return precautions given.   Risks, benefits, and alternatives of the medications and treatment plan prescribed today were discussed, and patient expressed understanding.   Education regarding symptom management and diagnosis given to patient on AVS.  Continue to follow with McLean-Scocuzza, Nino Glow, MD for routine health maintenance.   Wanda Nelson and I agreed with plan.  Mable Paris, FNP

## 2020-08-31 NOTE — ED Notes (Signed)
Verbal orders from Dr Charna Archer to follow up with CTA chest.

## 2020-08-31 NOTE — ED Triage Notes (Addendum)
Pt comes pov with abnormal dimer from PCP. Had cbc, bmp, dimer, ekg done this morning for intermittent SOB with exertion. Pt denies any SOB at this time. Pt had recent 10 hour trip to [place] and back.

## 2020-08-31 NOTE — Telephone Encounter (Signed)
[name] calling back in and states she was able to contact the Patient.   The Patient is on her way to the ED per Margaret's advise   For your information

## 2020-08-31 NOTE — Assessment & Plan Note (Addendum)
Improved. Walked with patient down hallway and she was not labored in speech. Sa02 remained  96% and HR 86. She was talking freely.  EKG showed SR, no ischemia and no significant changes from prior EKG with Dr Fletcher Anon 06/23/20. Wells criteria PE low risk. Case reviewed with supervising, Dr Deborra Medina, and she and I jointly agreed to order stat D dimer after patient's long trip overseas. If negative, would feel ressured that no PE. D dimer returned positive and I reviewed this finding with patient on the phone and daughter [name]; I explained my concern that this could mean that she has a pulmonary embolism.  I advised to go [facility] today to be evaluated for this to be ruled out.  Patient verbalized understanding and she was in the car with a friend driving her to [facility] . I also discussed her chest x-ray showed interstitial lung disease.  She was aware of this diagnosis. Report  given to [facility] at [facility] ED.  Referral has been placed to pulmonology. Will follow.

## 2020-08-31 NOTE — ED Provider Notes (Signed)
[facility]-EMERGENCY DEPARTMENT  ____________________________________________  Time seen: Approximately 5:28 PM  I have reviewed the triage vital signs and the nursing notes.   HISTORY  Chief Complaint Abnormal Lab   Historian Patient    HPI Wanda Nelson is a 87 y.o. female presents to the emergency department after being seen and evaluated by her primary care provider with elevated D-dimer.  Patient recently returned from an international trip.  She states that she had cough consistent with prior instances of bronchitis in the past and states that her symptoms are improving on their own.  Patient denies current shortness of breath, chest tightness or chest pain.  No fever or chills at home.  No nausea, vomiting or abdominal pain.   Past Medical History:  Diagnosis Date   Actinic keratosis    Anemia    COVID-19    05/30/20 had covid 19 pill    Glaucoma    History of blood transfusion    History of hiatal hernia    Hyperlipidemia    Incidental pulmonary nodule, > 77m and < 834m3/12/2018   Noted on CT scan - needs f/u imaging 6 months   s/p resection left atrial myxoma 03/21/2018   Squamous cell carcinoma of skin 11/12/2019   L distal dorsum forearm - ED&C    [facility]     Immunizations up to date:  Yes.     Past Medical History:  Diagnosis Date   Actinic keratosis    Anemia    COVID-19    05/30/20 had covid 19 pill    Glaucoma    History of blood transfusion    History of hiatal hernia    Hyperlipidemia    Incidental pulmonary nodule, > 69m9mnd < 8mm74m12/2020   Noted on CT scan - needs f/u imaging 6 months   s/p resection left atrial myxoma 03/21/2018   Squamous cell carcinoma of skin 11/12/2019   L distal dorsum forearm [facility] ED[facility]    [facility]  Patient Active Problem List   Diagnosis Date Noted   SOB (shortness of breath) 08/31/2020   Pain due to onychomycosis of toenails of both feet 07/28/2020   COVID-19 05/31/2020   Viral URI 05/31/2020    UTI (urinary tract infection) 10/04/2018   Iron deficiency anemia 07/31/2018   Hiatal hernia 07/31/2018   Gastroesophageal reflux disease 07/31/2018   Atherosclerosis 07/31/2018   Incidental pulmonary nodule, > 69mm 71m < 8mm 021m2/2020   s/p resection left atrial myxoma 03/19/2018   HLD (hyperlipidemia) 03/15/2015   History of stroke 03/15/2015   Glaucoma 03/15/2015    Past Surgical History:  Procedure Laterality Date   ABDOMINAL HYSTERECTOMY  1982   total   BUNIONECTOMY Bilateral    CLIPPING OF ATRIAL APPENDAGE  03/21/2018   Procedure: Clipping Of Atrial Appendage - using an AtriCure 45mm c67m  Surgeon: Owen, CRexene AlbertsLocation: [facility] OR; [facility];;   EXCISION OF ATRIAL MYXOMA N/A 03/21/2018   Procedure: Excision Of Atrial Myxoma;  Surgeon: Owen, CRexene AlbertsLocation: [facility] OR; [place]: [facility] Surgery;  Laterality: N/A;   RECTOCELE REPAIR     RIGHT/LEFT HEART CATH AND CORONARY ANGIOGRAPHY N/A 03/19/2018   Procedure: RIGHT/LEFT HEART CATH AND CORONARY ANGIOGRAPHY;  Surgeon: Arida, Wellington HampshireLocation[facility] [place];  Service: Cardiovascular;  Laterality: N/A;   TEE WITHOUT CARDIOVERSION N/A 03/21/2018   Procedure: TRANSESOPHAGEAL ECHOCARDIOGRAM (TEE);  Surgeon: Owen, CDarylene Price  H, MD;  Location: [place];  Service: Open Heart Surgery;  Laterality: N/A;   TONSILLECTOMY      Prior to Admission medications   Medication Sig Start Date End Date Taking? Authorizing Provider  amLODipine (NORVASC) 2.5 MG tablet Take 1 tablet (2.5 mg total) by mouth daily. 06/15/20   McLean-Scocuzza, Nino Glow, MD  Ascorbic Acid (VITAMIN C PO) Take by mouth daily at 12 noon.    [provider]  aspirin EC 81 MG tablet Take 1 tablet (81 mg total) by mouth daily. 05/23/20   McLean-Scocuzza, Nino Glow, MD  atorvastatin (LIPITOR) 20 MG tablet Take 1 tablet (20 mg total) by mouth daily at 6 PM. 05/13/20   McLean-Scocuzza, Nino Glow, MD  benzonatate (TESSALON) 100 MG capsule  Take 1 capsule (100 mg total) by mouth 3 (three) times daily as needed for cough. 08/25/20   Sharion Balloon, NP  Calcium Carbonate-Vitamin D 500-125 MG-UNIT TABS Take 1 tablet by mouth daily.    [provider]  cholecalciferol (VITAMIN D) 1000 units tablet Take 1,000 Units by mouth daily.    [provider]  ferrous sulfate (FEROSUL) 325 (65 FE) MG tablet Take 1 tablet (325 mg total) by mouth daily with breakfast. With vitamin C 500 mg daily 05/23/20   McLean-Scocuzza, Nino Glow, MD  Flaxseed, Linseed, (FLAX SEED OIL) 1300 MG CAPS Take 1,100 mg by mouth daily.     [provider]  losartan (COZAAR) 25 MG tablet Take 0.5 tablets (12.5 mg total) by mouth daily. Dc norvasc 2.5 mg qd 07/20/20   McLean-Scocuzza, Nino Glow, MD  Multiple Vitamin (MULTIVITAMIN) tablet Take 1 tablet by mouth daily.    [provider]  Omega-3 Fatty Acids (FISH OIL) 1200 MG CAPS Take by mouth daily at 8 pm.    [provider]  omeprazole (PRILOSEC) 20 MG capsule Take 1 capsule (20 mg total) by mouth daily. 30 minutes before breakfast prn 05/13/20   McLean-Scocuzza, Nino Glow, MD  scopolamine (TRANSDERM-SCOP) 1 MG/3DAYS Place 1 patch (1.5 mg total) onto the skin every 3 (three) days. 30 minutes to 4 hrs before travel 05/13/20   McLean-Scocuzza, Nino Glow, MD    Allergies Bee venom, Codeine, Morphine and related, Shellfish allergy, Bactrim [sulfamethoxazole-trimethoprim], Norvasc [amlodipine], and Nitrofurantoin  Family History  Problem Relation Age of Onset   Colon cancer Brother    Cancer Brother        neck tumor cancerous    Esophageal varices Brother    Colon cancer Maternal Uncle    Colon cancer Maternal Grandmother    Kidney disease Brother    Coronary artery disease Brother        with stent, cabg   Breast cancer Neg Hx     Social History Social History   Tobacco Use   Smoking status: Never   Smokeless tobacco: Never  Vaping Use   Vaping Use: Never used  Substance Use  Topics   Alcohol use: No    Alcohol/week: 0.0 standard drinks   Drug use: No     Review of Systems  Constitutional: No fever/chills Eyes:  No discharge ENT: No upper respiratory complaints. Respiratory: no cough. No SOB/ use of accessory muscles to breath Gastrointestinal:   No nausea, no vomiting.  No diarrhea.  No constipation. Musculoskeletal: Negative for musculoskeletal pain. Skin: Negative for rash, abrasions, lacerations, ecchymosis.    ____________________________________________   PHYSICAL EXAM:  VITAL SIGNS: ED Triage Vitals  Enc Vitals Group  BP 08/31/20 1437 (!) 149/65     Pulse Rate 08/31/20 1437 90     Resp 08/31/20 1437 20     Temp 08/31/20 1437 97.9 F (36.6 C)     Temp Source 08/31/20 1437 Oral     SpO2 08/31/20 1437 95 %     Weight 08/31/20 1438 116 lb (52.6 kg)     Height 08/31/20 1438 5' 2.01" (1.575 m)     Head Circumference --      Peak Flow --      Pain Score 08/31/20 1441 0     Pain Loc --      Pain Edu? --      Excl. in [place]? --      Constitutional: Alert and oriented. Well appearing and in no acute distress. Eyes: Conjunctivae are normal. PERRL. EOMI. Head: Atraumatic. ENT:      Nose: No congestion/rhinnorhea.      Mouth/Throat: Mucous membranes are moist.  Neck: No stridor.  No cervical spine tenderness to palpation. Cardiovascular: Normal rate, regular rhythm. Normal S1 and S2.  Good peripheral circulation. Respiratory: Normal respiratory effort without tachypnea or retractions. Lungs CTAB. Good air entry to the bases with no decreased or absent breath sounds Gastrointestinal: Bowel sounds x 4 quadrants. Soft and nontender to palpation. No guarding or rigidity. No distention. Musculoskeletal: Full range of motion to all extremities. No obvious deformities noted Neurologic:  Normal for age. No gross focal neurologic deficits are appreciated.  Skin:  Skin is warm, dry and intact. No rash noted. Psychiatric: Mood and affect are normal  for age. Speech and behavior are normal.   ____________________________________________   LABS (all labs ordered are listed, but only abnormal results are displayed)  Labs Reviewed - No data to display ____________________________________________  EKG   ____________________________________________  RADIOLOGY Unk Pinto, personally viewed and evaluated these images (plain radiographs) as part of my medical decision making, as well as reviewing the written report by the radiologist.  DG Chest 2 View  Result Date: 08/31/2020 CLINICAL DATA:  Shortness of breath, cough. EXAM: CHEST - 2 VIEW COMPARISON:  05/19/2018 and 03/31/2018 FINDINGS: Chronic interstitial lung densities are again noted in both lungs. No new airspace disease or consolidation. Lung volumes are similar to the previous examination. Heart and mediastinum are stable with previous left atrial appendage clipping. Negative for a pneumothorax. No large pleural effusions. IMPRESSION: Chronic interstitial lung disease.  No acute findings. Electronically Signed   By: Markus Daft M.D.   On: 08/31/2020 10:48   CT Angio Chest PE W and/or Wo Contrast  Result Date: 08/31/2020 CLINICAL DATA:  Positive D-dimer, intermittent dyspnea on exertion, COVID-19 positive 3 months ago EXAM: CT ANGIOGRAPHY CHEST WITH CONTRAST TECHNIQUE: Multidetector CT imaging of the chest was performed using the standard protocol during bolus administration of intravenous contrast. Multiplanar CT image reconstructions and MIPs were obtained to evaluate the vascular anatomy. CONTRAST:  52m OMNIPAQUE IOHEXOL 350 MG/ML SOLN COMPARISON:  03/30/2019, 08/31/2020 FINDINGS: Cardiovascular: This is a technically adequate evaluation of the pulmonary vasculature. No filling defects or pulmonary emboli. The heart is unremarkable without pericardial effusion. Postsurgical changes are seen from prior median sternotomy. No evidence of thoracic aortic aneurysm or dissection.  Moderate atherosclerosis. Mediastinum/Nodes: No enlarged mediastinal, hilar, or axillary lymph nodes. Thyroid gland, trachea, and esophagus demonstrate no significant findings. Stable large hiatal hernia. Lungs/Pleura: Diffuse parenchymal scarring and fibrosis unchanged since prior exam. No acute airspace disease, effusion, or pneumothorax. Stable 5 mm left lower  lobe pulmonary nodule and 6 mm right middle lobe pulmonary nodule are unchanged for greater than 2 years and can be considered benign. Upper Abdomen: No acute abnormality. Musculoskeletal: No acute or destructive bony lesions. Reconstructed images demonstrate no additional findings. Review of the MIP images confirms the above findings. IMPRESSION: 1. No evidence of pulmonary embolus. 2. Large hiatal hernia. 3. Chronic interstitial scarring and fibrosis unchanged. 4.  Aortic Atherosclerosis (ICD10-I70.0). Electronically Signed   By: Randa Ngo M.D.   On: 08/31/2020 16:04    ____________________________________________    PROCEDURES  Procedure(s) performed:     Procedures     Medications  iohexol (OMNIPAQUE) 350 MG/ML injection 75 mL (75 mLs Intravenous Contrast Given 08/31/20 1521)     ____________________________________________   INITIAL IMPRESSION / ASSESSMENT AND PLAN / ED COURSE  Pertinent labs & imaging results that were available during my care of the patient were reviewed by me and considered in my medical decision making (see chart for details).      Assessment and plan Elevated D-dimer 87 year old female presents to the emergency department with concern for elevated D-dimer from primary care.  CTA shows no evidence of PE.  Patient denied current shortness of breath and stated that overall her symptoms were improving.  Patient was advised to follow-up with primary care as needed.  All patient questions were answered.     ____________________________________________  FINAL CLINICAL IMPRESSION(S) / ED  DIAGNOSES  Final diagnoses:  Shortness of breath      NEW MEDICATIONS STARTED DURING THIS VISIT:  ED Discharge Orders     None           This chart was dictated using voice recognition software/Dragon. Despite best efforts to proofread, errors can occur which can change the meaning. Any change was purely unintentional.     Karren Cobble 08/31/20 Jerolyn Shin, MD 08/31/20 [phone]

## 2020-09-01 ENCOUNTER — Encounter: Payer: Self-pay | Admitting: Family

## 2020-09-29 ENCOUNTER — Encounter: Payer: Self-pay | Admitting: Internal Medicine

## 2020-09-29 DIAGNOSIS — Z23 Encounter for immunization: Secondary | ICD-10-CM | POA: Diagnosis not present

## 2020-10-10 DIAGNOSIS — H6123 Impacted cerumen, bilateral: Secondary | ICD-10-CM | POA: Diagnosis not present

## 2020-10-10 DIAGNOSIS — H90A32 Mixed conductive and sensorineural hearing loss, unilateral, left ear with restricted hearing on the contralateral side: Secondary | ICD-10-CM | POA: Diagnosis not present

## 2020-10-10 DIAGNOSIS — H6982 Other specified disorders of Eustachian tube, left ear: Secondary | ICD-10-CM | POA: Diagnosis not present

## 2020-10-10 DIAGNOSIS — H903 Sensorineural hearing loss, bilateral: Secondary | ICD-10-CM | POA: Diagnosis not present

## 2020-10-20 DIAGNOSIS — Z23 Encounter for immunization: Secondary | ICD-10-CM | POA: Diagnosis not present

## 2020-10-24 DIAGNOSIS — H16223 Keratoconjunctivitis sicca, not specified as Sjogren's, bilateral: Secondary | ICD-10-CM | POA: Diagnosis not present

## 2020-10-24 DIAGNOSIS — H401112 Primary open-angle glaucoma, right eye, moderate stage: Secondary | ICD-10-CM | POA: Diagnosis not present

## 2020-10-24 DIAGNOSIS — Z961 Presence of intraocular lens: Secondary | ICD-10-CM | POA: Diagnosis not present

## 2020-10-24 DIAGNOSIS — H02883 Meibomian gland dysfunction of right eye, unspecified eyelid: Secondary | ICD-10-CM | POA: Diagnosis not present

## 2020-10-24 DIAGNOSIS — H02886 Meibomian gland dysfunction of left eye, unspecified eyelid: Secondary | ICD-10-CM | POA: Diagnosis not present

## 2020-10-24 DIAGNOSIS — H401123 Primary open-angle glaucoma, left eye, severe stage: Secondary | ICD-10-CM | POA: Diagnosis not present

## 2020-10-26 ENCOUNTER — Encounter: Payer: Self-pay | Admitting: Internal Medicine

## 2020-10-31 ENCOUNTER — Ambulatory Visit: Payer: Medicare Other | Admitting: Podiatry

## 2020-11-14 ENCOUNTER — Other Ambulatory Visit: Payer: Self-pay | Admitting: Otolaryngology

## 2020-11-14 DIAGNOSIS — H9042 Sensorineural hearing loss, unilateral, left ear, with unrestricted hearing on the contralateral side: Secondary | ICD-10-CM

## 2020-11-14 DIAGNOSIS — J300 Vasomotor rhinitis: Secondary | ICD-10-CM | POA: Diagnosis not present

## 2020-11-14 DIAGNOSIS — H90A32 Mixed conductive and sensorineural hearing loss, unilateral, left ear with restricted hearing on the contralateral side: Secondary | ICD-10-CM | POA: Diagnosis not present

## 2020-11-16 ENCOUNTER — Other Ambulatory Visit: Payer: Self-pay

## 2020-11-16 ENCOUNTER — Encounter: Payer: Self-pay | Admitting: Internal Medicine

## 2020-11-16 ENCOUNTER — Ambulatory Visit (INDEPENDENT_AMBULATORY_CARE_PROVIDER_SITE_OTHER): Payer: Medicare Other | Admitting: Internal Medicine

## 2020-11-16 VITALS — BP 126/80 | HR 50 | Temp 97.6°F | Ht 62.01 in | Wt 120.8 lb

## 2020-11-16 DIAGNOSIS — R918 Other nonspecific abnormal finding of lung field: Secondary | ICD-10-CM | POA: Diagnosis not present

## 2020-11-16 DIAGNOSIS — Z0000 Encounter for general adult medical examination without abnormal findings: Secondary | ICD-10-CM | POA: Diagnosis not present

## 2020-11-16 DIAGNOSIS — I700 Atherosclerosis of aorta: Secondary | ICD-10-CM | POA: Insufficient documentation

## 2020-11-16 DIAGNOSIS — H9192 Unspecified hearing loss, left ear: Secondary | ICD-10-CM | POA: Insufficient documentation

## 2020-11-16 DIAGNOSIS — Z1231 Encounter for screening mammogram for malignant neoplasm of breast: Secondary | ICD-10-CM

## 2020-11-16 DIAGNOSIS — J8410 Pulmonary fibrosis, unspecified: Secondary | ICD-10-CM

## 2020-11-16 DIAGNOSIS — I10 Essential (primary) hypertension: Secondary | ICD-10-CM

## 2020-11-16 DIAGNOSIS — J984 Other disorders of lung: Secondary | ICD-10-CM | POA: Diagnosis not present

## 2020-11-16 DIAGNOSIS — J40 Bronchitis, not specified as acute or chronic: Secondary | ICD-10-CM

## 2020-11-16 DIAGNOSIS — E611 Iron deficiency: Secondary | ICD-10-CM

## 2020-11-16 DIAGNOSIS — R5383 Other fatigue: Secondary | ICD-10-CM | POA: Diagnosis not present

## 2020-11-16 DIAGNOSIS — K449 Diaphragmatic hernia without obstruction or gangrene: Secondary | ICD-10-CM

## 2020-11-16 HISTORY — DX: Pulmonary fibrosis, unspecified: J84.10

## 2020-11-16 LAB — TSH: TSH: 1.4 u[IU]/mL (ref 0.35–5.50)

## 2020-11-16 LAB — COMPREHENSIVE METABOLIC PANEL
ALT: 16 U/L (ref 0–35)
AST: 19 U/L (ref 0–37)
Albumin: 4.2 g/dL (ref 3.5–5.2)
Alkaline Phosphatase: 76 U/L (ref 39–117)
BUN: 16 mg/dL (ref 6–23)
CO2: 29 mEq/L (ref 19–32)
Calcium: 9.1 mg/dL (ref 8.4–10.5)
Chloride: 103 mEq/L (ref 96–112)
Creatinine, Ser: 0.78 mg/dL (ref 0.40–1.20)
GFR: 68.18 mL/min (ref >60.00)
Glucose, Bld: 82 mg/dL (ref 70–99)
Potassium: 4 mEq/L (ref 3.5–5.1)
Sodium: 138 mEq/L (ref 135–145)
Total Bilirubin: 1 mg/dL (ref 0.2–1.2)
Total Protein: 7 g/dL (ref 6.0–8.3)

## 2020-11-16 LAB — CBC WITH DIFFERENTIAL/PLATELET
Basophils Absolute: 0.1 10*3/uL (ref 0.0–0.1)
Basophils Relative: 0.8 % (ref 0.0–3.0)
Eosinophils Absolute: 0.3 10*3/uL (ref 0.0–0.7)
Eosinophils Relative: 5 % (ref 0.0–5.0)
HCT: 44.6 % (ref 36.0–46.0)
Hemoglobin: 14.7 g/dL (ref 12.0–15.0)
Lymphocytes Relative: 26.5 % (ref 12.0–46.0)
Lymphs Abs: 1.6 10*3/uL (ref 0.7–4.0)
MCHC: 32.9 g/dL (ref 30.0–36.0)
MCV: 96.7 fl (ref 78.0–100.0)
Monocytes Absolute: 0.6 10*3/uL (ref 0.1–1.0)
Monocytes Relative: 10.2 % (ref 3.0–12.0)
Neutro Abs: 3.5 10*3/uL (ref 1.4–7.7)
Neutrophils Relative %: 57.5 % (ref 43.0–77.0)
Platelets: 164 10*3/uL (ref 150.0–400.0)
RBC: 4.62 Mil/uL (ref 3.87–5.11)
RDW: 13.8 % (ref 11.5–15.5)
WBC: 6.2 10*3/uL (ref 4.0–10.5)

## 2020-11-16 LAB — LIPID PANEL
Cholesterol: 175 mg/dL (ref 0–200)
HDL: 59 mg/dL (ref >39.00)
LDL Cholesterol: 95 mg/dL (ref 0–99)
NonHDL: 115.66
Total CHOL/HDL Ratio: 3
Triglycerides: 101 mg/dL (ref 0.0–149.0)
VLDL: 20.2 mg/dL (ref 0.0–40.0)

## 2020-11-16 MED ORDER — AZITHROMYCIN 250 MG PO TABS: ORAL_TABLET | ORAL | 0 refills | Status: AC

## 2020-11-16 NOTE — Progress Notes (Signed)
Chief Complaint  Patient presents with   Follow-up   Annual Exam   Annual  1. Htn on norvasc 2.5 mg qd losartan 25 mg qd controlled home readings 110-147/50s-70s mostly <130/<80 hr 50s-70s  2. Traveling [place] 02/2021 wants rx px zpack for bronchiits    Review of Systems  Constitutional:  Negative for weight loss.  HENT:  Negative for hearing loss.   Eyes:  Negative for blurred vision.  Respiratory:  Negative for shortness of breath.   Cardiovascular:  Negative for chest pain.  Gastrointestinal:  Negative for abdominal pain and blood in stool.  Genitourinary:  Negative for dysuria.  Musculoskeletal:  Negative for falls and joint pain.  Skin:  Negative for rash.  Neurological:  Negative for headaches.  Psychiatric/Behavioral:  Negative for depression.   Past Medical History:  Diagnosis Date   Actinic keratosis    Anemia    COVID-19    05/30/20 had covid 19 pill    Glaucoma    History of blood transfusion    History of hiatal hernia    Hyperlipidemia    Incidental pulmonary nodule, > 44mm and < 38mm 03/20/2018   Noted on CT scan - needs f/u imaging 6 months   s/p resection left atrial myxoma 03/21/2018   Squamous cell carcinoma of skin 11/12/2019   L distal dorsum forearm - ED&C    Stroke [facility])    Past Surgical History:  Procedure Laterality Date   ABDOMINAL HYSTERECTOMY  1982   total   BUNIONECTOMY Bilateral    CLIPPING OF ATRIAL APPENDAGE  03/21/2018   Procedure: Clipping Of Atrial Appendage - using an AtriCure 82mm clip;  Surgeon: Rexene Alberts, MD;  Location: [facility] OR;  Service: Open Heart Surgery;;   EXCISION OF ATRIAL MYXOMA N/A 03/21/2018   Procedure: Excision Of Atrial Myxoma;  Surgeon: Rexene Alberts, MD;  Location: [place];  Service: Open Heart Surgery;  Laterality: N/A;   RECTOCELE REPAIR     RIGHT/LEFT HEART CATH AND CORONARY ANGIOGRAPHY N/A 03/19/2018   Procedure: RIGHT/LEFT HEART CATH AND CORONARY ANGIOGRAPHY;  Surgeon: Wellington Hampshire, MD;  Location: [place] [facility];  Service: Cardiovascular;  Laterality: N/A;   TEE WITHOUT CARDIOVERSION N/A 03/21/2018   Procedure: TRANSESOPHAGEAL ECHOCARDIOGRAM (TEE);  Surgeon: Rexene Alberts, MD;  Location: [place];  Service: Open Heart Surgery;  Laterality: N/A;   TONSILLECTOMY     Family History  Problem Relation Age of Onset   Colon cancer Brother    Cancer Brother        neck tumor cancerous    Esophageal varices Brother    Colon cancer Maternal Uncle    Colon cancer Maternal Grandmother    Kidney disease Brother    Coronary artery disease Brother        with stent, cabg   Breast cancer Neg Hx    Social History   Socioeconomic History   Marital status: Widowed    Spouse name: Not on file   Number of children: Not on file   Years of education: Not on file   Highest education level: Not on file  Occupational History   Not on file  Tobacco Use   Smoking status: Never   Smokeless tobacco: Never  Vaping Use   Vaping Use: Never used  Substance and Sexual Activity   Alcohol use: No    Alcohol/week: 0.0 standard drinks   Drug use: No   Sexual activity: Not Currently  Other Topics Concern  Not on file  Social History Narrative   Lives [place],  husband died December 06, 2018 due to brain hemorrhage   Daughter in law Girtha Kilgore    widowed   Social Determinants of Health   Financial Resource Strain: Low Risk    Difficulty of Paying Living Expenses: Not hard at all  Food Insecurity: No Food Insecurity   Worried About Charity fundraiser in the Last Year: Never true   Arboriculturist in the Last Year: Never true  Transportation Needs: No Transportation Needs   Lack of Transportation (Medical): No   Lack of Transportation (Non-Medical): No  Physical Activity: Sufficiently Active   Days of Exercise per Week: 5 days   Minutes of Exercise per Session: 60 min  Stress: No Stress Concern Present   Feeling of Stress : Not at all  Social Connections: Unknown   Frequency of Communication  with Friends and Family: More than three times a week   Frequency of Social Gatherings with Friends and Family: More than three times a week   Attends Religious Services: Not on file   Active Member of Clubs or Organizations: Not on file   Attends Archivist Meetings: Not on file   Marital Status: Widowed  Human resources officer Violence: Not At Risk   Fear of Current or Ex-Partner: No   Emotionally Abused: No   Physically Abused: No   Sexually Abused: No   Current Meds  Medication Sig   amLODipine (NORVASC) 2.5 MG tablet Take 1 tablet (2.5 mg total) by mouth daily.   Ascorbic Acid (VITAMIN C PO) Take by mouth daily at 12 noon.   aspirin EC 81 MG tablet Take 1 tablet (81 mg total) by mouth daily.   atorvastatin (LIPITOR) 20 MG tablet Take 1 tablet (20 mg total) by mouth daily at 6 PM.   azithromycin (ZITHROMAX) 250 MG tablet With food Take 2 tablets on day 1, then 1 tablet daily on days 2 through 5   Calcium Carbonate-Vitamin D 500-125 MG-UNIT TABS Take 1 tablet by mouth daily.   cholecalciferol (VITAMIN D) 1000 units tablet Take 1,000 Units by mouth daily.   ferrous sulfate (FEROSUL) 325 (65 FE) MG tablet Take 1 tablet (325 mg total) by mouth daily with breakfast. With vitamin C 500 mg daily   Flaxseed, Linseed, (FLAX SEED OIL) 1300 MG CAPS Take 1,100 mg by mouth daily.    losartan (COZAAR) 25 MG tablet Take 0.5 tablets (12.5 mg total) by mouth daily. Dc norvasc 2.5 mg qd   Multiple Vitamin (MULTIVITAMIN) tablet Take 1 tablet by mouth daily.   Omega-3 Fatty Acids (FISH OIL) 1200 MG CAPS Take by mouth daily at 8 pm.   omeprazole (PRILOSEC) 20 MG capsule Take 1 capsule (20 mg total) by mouth daily. 30 minutes before breakfast prn   Allergies  Allergen Reactions   Bee Venom Anaphylaxis and Hives   Codeine Other (See Comments)    Altered mental status Altered mental status   Morphine And Related     Altered mental status   Shellfish Allergy Anaphylaxis and Hives    Mainly shrimp    Bactrim [Sulfamethoxazole-Trimethoprim]     ? Reaction     Norvasc [Amlodipine]     2.5 leg edema    Nitrofurantoin Rash   Recent Results (from the past 2160 hour(s))  D-Dimer, Quantitative     Status: Abnormal   Collection Time: 08/31/20 10:15 AM  Result Value Ref Range   D-Dimer, America Brown  0.78 (H) 0.00 - 0.50 ug/mL-FEU    Comment: (NOTE) At the manufacturer cut-off value of 0.5 g/mL FEU, this assay has a negative predictive value of 95-100%.This assay is intended for use in conjunction with a clinical pretest probability (PTP) assessment model to exclude pulmonary embolism (PE) and deep venous thrombosis (DVT) in outpatients suspected of PE or DVT. Results should be correlated with clinical presentation. Performed at [facility], [address] [place], [place] [address]   CBC with Differential/Platelet     Status: None   Collection Time: 08/31/20 10:15 AM  Result Value Ref Range   WBC 8.0 4.0 - 10.5 K/uL   RBC 4.55 3.87 - 5.11 MIL/uL   Hemoglobin 14.9 12.0 - 15.0 g/dL   HCT 44.1 36.0 - 46.0 %   MCV 96.9 80.0 - 100.0 fL   MCH 32.7 26.0 - 34.0 pg   MCHC 33.8 30.0 - 36.0 g/dL   RDW 12.8 11.5 - 15.5 %   Platelets 357 150 - 400 K/uL   nRBC 0.0 0.0 - 0.2 %   Neutrophils Relative % 64 %   Neutro Abs 5.2 1.7 - 7.7 K/uL   Lymphocytes Relative 24 %   Lymphs Abs 1.9 0.7 - 4.0 K/uL   Monocytes Relative 7 %   Monocytes Absolute 0.6 0.1 - 1.0 K/uL   Eosinophils Relative 3 %   Eosinophils Absolute 0.3 0.0 - 0.5 K/uL   Basophils Relative 1 %   Basophils Absolute 0.1 0.0 - 0.1 K/uL   Immature Granulocytes 1 %   Abs Immature Granulocytes 0.04 0.00 - 0.07 K/uL    Comment: Performed at [facility], [address] [place], [place] [address]  Comprehensive metabolic panel     Status: Abnormal   Collection Time: 08/31/20 10:15 AM  Result Value Ref Range   Sodium 138 135 - 145 mmol/L   Potassium 4.3 3.5 - 5.1 mmol/L   Chloride 100 98 - 111 mmol/L   CO2 28 22  - 32 mmol/L   Glucose, Bld 96 70 - 99 mg/dL    Comment: Glucose reference range applies only to samples taken after fasting for at least 8 hours.   BUN 16 8 - 23 mg/dL   Creatinine, Ser 0.94 0.44 - 1.00 mg/dL   Calcium 9.7 8.9 - 10.3 mg/dL   Total Protein 8.2 (H) 6.5 - 8.1 g/dL   Albumin 3.5 3.5 - 5.0 g/dL   AST 19 15 - 41 U/L   ALT 25 0 - 44 U/L   Alkaline Phosphatase 128 (H) 38 - 126 U/L   Total Bilirubin 0.9 0.3 - 1.2 mg/dL   GFR, Estimated 59 (L) >60 mL/min    Comment: (NOTE) Calculated using the CKD-EPI Creatinine Equation (2021)    Anion gap 10 5 - 15    Comment: Performed at [facility], [address] [place], [place] [address]   Objective  Body mass index is 22.09 kg/m. Wt Readings from Last 3 Encounters:  11/16/20 120 lb 12.8 oz (54.8 kg)  08/31/20 116 lb (52.6 kg)  08/31/20 116 lb (52.6 kg)   Temp Readings from Last 3 Encounters:  11/16/20 97.6 F (36.4 C) (Oral)  08/31/20 97.9 F (36.6 C) (Oral)  08/31/20 (!) 96 F (35.6 C)   BP Readings from Last 3 Encounters:  11/16/20 126/80  08/31/20 (!) 149/65  08/31/20 130/88   Pulse Readings from Last 3 Encounters:  11/16/20 (!) 50  08/31/20 90  08/31/20 92  Physical Exam Vitals and nursing note reviewed.  Constitutional:      Appearance: Normal appearance. She is well-developed and well-groomed.  HENT:     Head: Normocephalic and atraumatic.  Eyes:     Conjunctiva/sclera: Conjunctivae normal.     Pupils: Pupils are equal, round, and reactive to light.  Cardiovascular:     Rate and Rhythm: Normal rate and regular rhythm.     Heart sounds: Normal heart sounds. No murmur heard. Pulmonary:     Effort: Pulmonary effort is normal.     Breath sounds: Normal breath sounds.  Abdominal:     General: Abdomen is flat. Bowel sounds are normal.     Tenderness: There is no abdominal tenderness.  Musculoskeletal:        General: No tenderness.  Skin:    General: Skin is warm and dry.  Neurological:      General: No focal deficit present.     Mental Status: She is alert and oriented to person, place, and time. Mental status is at baseline.     Cranial Nerves: Cranial nerves 2-12 are intact.     Gait: Gait is intact.  Psychiatric:        Attention and Perception: Attention and perception normal.        Mood and Affect: Mood and affect normal.        Speech: Speech normal.        Behavior: Behavior normal. Behavior is cooperative.        Thought Content: Thought content normal.        Cognition and Memory: Cognition and memory normal.        Judgment: Judgment normal.    Assessment  Plan  Annual physical exam Flu shot utd  Tdap 05/02/20  shingrix 2/2  covid 19 3/3 moderna consider 4th dose  Had 11/162016 prevnar [facility]  pna 23 ? If had no record from [place] will need in future pt to check date of this   Out of age window pap Mammogram had in 2016 last consider b/l dx mammo and [place] will disc with pt 09/2018 right breast red and sore since heart surgery 03/2018  12/02/18 negative mammo and [place]  mammogram 01/27/20 negative    Colonoscopy/EGD in 2014 [facility]oscopy normal EGD large H/H per pt   DEXA 2015/2016 [facility] normal per pt   Skin- Dr. Nehemiah Massed  appt sch 11/2019 per pt seen and UTD 03/2021    Continue walking 3 miles daily and healthy eating  Bronchitis - Plan: azithromycin (ZITHROMAX) 250 MG tablet  Hearing loss of left ear, unspecified hearing loss type Pending ct temporal f/u Dr. Pryor Ochoa  Hiatal hernia Lung fibrosis ([place]) Scarring of lung Lung nodules Aortic atherosclerosis ([place])   Primary hypertension  controlled- Plan: Comprehensive metabolic panel, Lipid panel, CBC with Differential/Platelet, TSH Norvasc 2.5 mg qd and losartan 25 mg qd     Provider: Dr. Olivia Mackie McLean-Scocuzza-[facility]

## 2020-11-16 NOTE — Patient Instructions (Addendum)
Check date of pneumonia 23 vaccine please and let me know  You can try benadryl cream or over the allergy medications claritin/allegra/zyrtec/xzyal   Hives Hives (urticaria) are itchy, red, swollen areas on the skin. Hives can appear on any part of the body. Hives often fade within 24 hours (acute hives). Sometimes, new hives appear after old ones fade and the cycle can continue for several days or weeks (chronic hives). Hives do not spread from person to person (are not contagious). Hives come from the body's reaction to something a person is allergic to (allergen), something that causes irritation, or various other triggers. When a person is exposed to a trigger, his or her body releases a chemical (histamine) that causes redness, itching, and swelling. Hives can appear right after exposure to a trigger or hours later. What are the causes? This condition may be caused by: Allergies to foods or ingredients. Insect bites or stings. Exposure to pollen or pets. Spending time in sunlight, heat, or cold (exposure). Exercise. Stress. You can also get hives from other medical conditions and treatments, such as: Viruses, including the common cold. Bacterial infections, such as urinary tract infections and strep throat. Certain medicines. Contact with latex or chemicals. Allergy shots. Blood transfusions. Sometimes, the cause of this condition is not known (idiopathic hives). What increases the risk? You are more likely to develop this condition if you: Are a woman. Have food allergies, especially to citrus fruits, milk, eggs, peanuts, tree nuts, or shellfish. Are allergic to: Medicines. Latex. Insects. Animals. Pollen. What are the signs or symptoms? Common symptoms of this condition include raised, itchy, red or white bumps or patches on your skin. These areas may: Become large and swollen (welts). Change in shape and location, quickly and repeatedly. Be separate hives or connect over a  large area of skin. Sting or become painful. Turn white when pressed in the center (blanch). In severe cases, your hands, feet, and face may also become swollen. This may occur if hives develop deeper in your skin. How is this diagnosed? This condition may be diagnosed by your symptoms, medical history, and physical exam. Your skin, urine, or blood may be tested to find out what is causing your hives and to rule out other health issues. Your health care provider may also remove a small sample of skin from the affected area and examine it under a microscope (biopsy). How is this treated? Treatment for this condition depends on the cause and severity of your symptoms. Your health care provider may recommend using cool, wet cloths (cool compresses) or taking cool showers to relieve itching. Treatment may include: Medicines that help: Relieve itching (antihistamines). Reduce swelling (corticosteroids). Treat infection (antibiotics). An injectable medicine (omalizumab). Your health care provider may prescribe this if you have chronic idiopathic hives and you continue to have symptoms even after treatment with antihistamines. Severe cases may require an emergency injection of adrenaline (epinephrine) to prevent a life-threatening allergic reaction (anaphylaxis). Follow these instructions at home: Medicines Take and apply over-the-counter and prescription medicines only as told by your health care provider. If you were prescribed an antibiotic medicine, take it as told by your health care provider. Do not stop using the antibiotic even if you start to feel better. Skin care Apply cool compresses to the affected areas. Do not scratch or rub your skin. General instructions Do not take hot showers or baths. This can make itching worse. Do not wear tight-fitting clothing. Use sunscreen and wear protective clothing when you  are outside. Avoid any substances that cause your hives. Keep a journal to  help track what causes your hives. Write down: What medicines you take. What you eat and drink. What products you use on your skin. Keep all follow-up visits as told by your health care provider. This is important. Contact a health care provider if: Your symptoms are not controlled with medicine. Your joints are painful or swollen. Get help right away if: You have a fever. You have pain in your abdomen. Your tongue or lips are swollen. Your eyelids are swollen. Your chest or throat feels tight. You have trouble breathing or swallowing. These symptoms may represent a serious problem that is an emergency. Do not wait to see if the symptoms will go away. Get medical help right away. Call your local emergency services (911 in the [place] Do not drive yourself to the hospital. Summary Hives (urticaria) are itchy, red, swollen areas on your skin. Hives come from the body's reaction to something a person is allergic to (allergen), something that causes irritation, or various other triggers. Treatment for this condition depends on the cause and severity of your symptoms. Avoid any substances that cause your hives. Keep a journal to help track what causes your hives. Take and apply over-the-counter and prescription medicines only as told by your health care provider. Get help right away if your chest or throat feels tight or if you have trouble breathing or swallowing. This information is not intended to replace advice given to you by your health care provider. Make sure you discuss any questions you have with your health care provider. Document Revised: 02/14/2020 Document Reviewed: 02/14/2020 Elsevier Patient Education  [employer].

## 2020-11-17 ENCOUNTER — Encounter: Payer: Self-pay | Admitting: Internal Medicine

## 2020-11-17 LAB — IRON,TIBC AND FERRITIN PANEL
%SAT: 44 % (calc) (ref 16–45)
Ferritin: 221 ng/mL (ref 16–288)
Iron: 129 ug/dL (ref 45–160)
TIBC: 294 mcg/dL (calc) (ref 250–450)

## 2020-12-09 ENCOUNTER — Other Ambulatory Visit: Payer: Self-pay

## 2020-12-09 ENCOUNTER — Ambulatory Visit
Admission: RE | Admit: 2020-12-09 | Discharge: 2020-12-09 | Disposition: A | Discharge status: Home / Self Care | Payer: Medicare Other | Source: Ambulatory Visit | Attending: Otolaryngology | Admitting: Otolaryngology

## 2020-12-09 DIAGNOSIS — H748X2 Other specified disorders of left middle ear and mastoid: Secondary | ICD-10-CM | POA: Diagnosis not present

## 2020-12-09 DIAGNOSIS — H9042 Sensorineural hearing loss, unilateral, left ear, with unrestricted hearing on the contralateral side: Secondary | ICD-10-CM | POA: Diagnosis not present

## 2020-12-09 DIAGNOSIS — H9192 Unspecified hearing loss, left ear: Secondary | ICD-10-CM | POA: Diagnosis not present

## 2020-12-22 ENCOUNTER — Other Ambulatory Visit: Payer: Self-pay

## 2020-12-22 ENCOUNTER — Encounter: Payer: Self-pay | Admitting: Podiatry

## 2020-12-22 ENCOUNTER — Ambulatory Visit (INDEPENDENT_AMBULATORY_CARE_PROVIDER_SITE_OTHER): Payer: Medicare Other | Admitting: Podiatry

## 2020-12-22 DIAGNOSIS — B351 Tinea unguium: Secondary | ICD-10-CM | POA: Diagnosis not present

## 2020-12-22 DIAGNOSIS — M79675 Pain in left toe(s): Secondary | ICD-10-CM

## 2020-12-22 DIAGNOSIS — M79674 Pain in right toe(s): Secondary | ICD-10-CM | POA: Diagnosis not present

## 2020-12-22 NOTE — Progress Notes (Signed)
This patient returns to the office for evaluation and treatment of long thick painful nails .  This patient is unable to trim his own nails since the patient cannot reach his feet.  Patient says the nails are painful walking and wearing his shoes.  He returns for preventive foot care services.  General Appearance  Alert, conversant and in no acute stress.  Vascular  Dorsalis pedis and posterior tibial  pulses are palpable  bilaterally.  Capillary return is within normal limits  bilaterally. Temperature is within normal limits  bilaterally.  Neurologic  Senn-Weinstein monofilament wire test within normal limits  bilaterally. Muscle power within normal limits bilaterally.  Nails Thick disfigured discolored nails with subungual debris  from hallux to fifth toes bilaterally. No evidence of bacterial infection or drainage bilaterally.  Orthopedic  No limitations of motion  feet .  No crepitus or effusions noted.  No bony pathology or digital deformities noted.  Skin  normotropic skin with no porokeratosis noted bilaterally.  No signs of infections or ulcers noted.     Onychomycosis  Pain in toes right foot  Pain in toes left foot  Debridement  of nails  1-5  B/L with a nail nipper.  Nails were then filed using a dremel tool with no incidents.    RTC 3 months    Boneta Lucks DPM

## 2021-01-19 ENCOUNTER — Other Ambulatory Visit: Payer: Self-pay | Admitting: Internal Medicine

## 2021-01-19 ENCOUNTER — Encounter: Payer: Self-pay | Admitting: Internal Medicine

## 2021-01-19 DIAGNOSIS — N3000 Acute cystitis without hematuria: Secondary | ICD-10-CM

## 2021-01-19 MED ORDER — CIPROFLOXACIN HCL 500 MG PO TABS: 500.0000 mg | ORAL_TABLET | Freq: Two times a day (BID) | ORAL | 0 refills | Status: DC

## 2021-01-19 NOTE — Telephone Encounter (Signed)
Please advise 

## 2021-02-02 ENCOUNTER — Ambulatory Visit (INDEPENDENT_AMBULATORY_CARE_PROVIDER_SITE_OTHER): Payer: Medicare Other | Admitting: Cardiovascular Disease

## 2021-02-02 ENCOUNTER — Encounter: Payer: Self-pay | Admitting: Cardiovascular Disease

## 2021-02-02 ENCOUNTER — Other Ambulatory Visit: Payer: Self-pay

## 2021-02-02 VITALS — BP 138/60 | HR 71 | Ht 62.0 in | Wt 120.5 lb

## 2021-02-02 DIAGNOSIS — I9789 Other postprocedural complications and disorders of the circulatory system, not elsewhere classified: Secondary | ICD-10-CM

## 2021-02-02 DIAGNOSIS — E785 Hyperlipidemia, unspecified: Secondary | ICD-10-CM

## 2021-02-02 DIAGNOSIS — D151 Benign neoplasm of heart: Secondary | ICD-10-CM

## 2021-02-02 DIAGNOSIS — I10 Essential (primary) hypertension: Secondary | ICD-10-CM

## 2021-02-02 DIAGNOSIS — I4891 Unspecified atrial fibrillation: Secondary | ICD-10-CM | POA: Diagnosis not present

## 2021-02-02 NOTE — Progress Notes (Signed)
Cardiology Office Note   Date:  02/02/2021   ID:  Wanda Nelson, Wanda Nelson Sep 27, 1931, MRN [id number]  PCP:  McLean-Scocuzza, Nino Glow, MD  Cardiologist:   [name], MD   Chief Complaint  Patient presents with   Other    6 month f/u no complaints today. Meds reviewed verbally with pt.       History of Present Illness: Wanda Nelson is a 88 y.o. female who presents for a follow-up visit post atrial myxoma resection. She has known history of anemia, hiatal hernia, hyperlipidemia and remote history of hemorrhagic stroke.  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  Cardiac catheterization before surgery showed no evidence of obstructive coronary artery disease.  She had intermittent palpitations and tachycardia shortly after surgery due to short runs of nonsustained ventricular tachycardia and intermittent A. fib with RVR. She did not require treatment for this and her symptoms resolved without intervention.  Echocardiogram in December 2020 showed normal LV systolic function with no significant valvular abnormalities.  There was a concern about possible recurrent atrial mass.  However, it appeared to be prominent scarring at the surgical site.  Most recent echocardiogram in June 2021showed no evidence of mass.  EF was 50 to 55%.  He had COVID-19 infection in May after a trip to [place].  She she had significant shortness of breath but ultimately recovered.   An echocardiogram was done in July 2022 which showed normal LV systolic function, grade 1 diastolic dysfunction and mild pulmonary hypertension.  There was moderate tricuspid regurgitation.  She has been doing well with no recent chest pain, shortness of breath or palpitations.  She continues to walk 3 miles daily.   Past Medical History:  Diagnosis Date   Actinic keratosis    Anemia    COVID-19    05/30/20 had covid 19 pill    Glaucoma     History of blood transfusion    History of hiatal hernia    Hyperlipidemia    Incidental pulmonary nodule, > 58mm and < 12mm 03/20/2018   Noted on CT scan - needs f/u imaging 6 months   s/p resection left atrial myxoma 03/21/2018   Squamous cell carcinoma of skin 11/12/2019   L distal dorsum forearm - ED&C    Stroke [facility])     Past Surgical History:  Procedure Laterality Date   ABDOMINAL HYSTERECTOMY  1982   total   BUNIONECTOMY Bilateral    CLIPPING OF ATRIAL APPENDAGE  03/21/2018   Procedure: Clipping Of Atrial Appendage - using an AtriCure 32mm clip;  Surgeon: Rexene Alberts, MD;  Location: [facility] OR;  Service: Open Heart Surgery;;   EXCISION OF ATRIAL MYXOMA N/A 03/21/2018   Procedure: Excision Of Atrial Myxoma;  Surgeon: Rexene Alberts, MD;  Location: [place];  Service: Open Heart Surgery;  Laterality: N/A;   RECTOCELE REPAIR     RIGHT/LEFT HEART CATH AND CORONARY ANGIOGRAPHY N/A 03/19/2018   Procedure: RIGHT/LEFT HEART CATH AND CORONARY ANGIOGRAPHY;  Surgeon: Wellington Hampshire, MD;  Location: [place] [facility];  Service: Cardiovascular;  Laterality: N/A;   TEE WITHOUT CARDIOVERSION N/A 03/21/2018   Procedure: TRANSESOPHAGEAL ECHOCARDIOGRAM (TEE);  Surgeon: Rexene Alberts, MD;  Location: [place];  Service: Open Heart Surgery;  Laterality: N/A;   TONSILLECTOMY       Current Outpatient Medications  Medication Sig Dispense Refill   amLODipine (NORVASC) 2.5 MG tablet Take  1 tablet (2.5 mg total) by mouth daily. 90 tablet 3   Ascorbic Acid (VITAMIN C PO) Take by mouth daily at 12 noon.     aspirin EC 81 MG tablet Take 1 tablet (81 mg total) by mouth daily. 90 tablet 3   atorvastatin (LIPITOR) 20 MG tablet Take 1 tablet (20 mg total) by mouth daily at 6 PM. 90 tablet 3   Calcium Carbonate-Vitamin D 500-125 MG-UNIT TABS Take 1 tablet by mouth daily.     cholecalciferol (VITAMIN D) 1000 units tablet Take 1,000 Units by mouth daily.     ferrous sulfate (FEROSUL) 325 (65 FE) MG tablet Take 1  tablet (325 mg total) by mouth daily with breakfast. With vitamin C 500 mg daily 90 tablet 3   Flaxseed, Linseed, (FLAX SEED OIL) 1300 MG CAPS Take 1,100 mg by mouth daily.      ipratropium (ATROVENT) 0.03 % nasal spray      losartan (COZAAR) 25 MG tablet Take 0.5 tablets (12.5 mg total) by mouth daily. Dc norvasc 2.5 mg qd 45 tablet 3   Multiple Vitamin (MULTIVITAMIN) tablet Take 1 tablet by mouth daily.     Omega-3 Fatty Acids (FISH OIL) 1200 MG CAPS Take by mouth daily at 8 pm.     omeprazole (PRILOSEC) 20 MG capsule Take 1 capsule (20 mg total) by mouth daily. 30 minutes before breakfast prn 90 capsule 3   No current facility-administered medications for this visit.    Allergies:   Bee venom, Codeine, Morphine and related, Shellfish allergy, Bactrim [sulfamethoxazole-trimethoprim], Norvasc [amlodipine], and Nitrofurantoin    Social History:  The patient  reports that she has never smoked. She has never used smokeless tobacco. She reports that she does not drink alcohol and does not use drugs.   Family History:  The patient's family history includes Cancer in her brother; Colon cancer in her brother, maternal grandmother, and maternal uncle; Coronary artery disease in her brother; Esophageal varices in her brother; Kidney disease in her brother.    ROS:  Please see the history of present illness.   Otherwise, review of systems are positive for none.   All other systems are reviewed and negative.    PHYSICAL EXAM: VS:  BP 138/60 (BP Location: Left Arm, Patient Position: Sitting, Cuff Size: Normal)    Pulse 71    Ht 5\' 2"  (1.575 m)    Wt 120 lb 8 oz (54.7 kg)    LMP  (LMP Unknown)    SpO2 95%    BMI 22.04 kg/m  , BMI Body mass index is 22.04 kg/m. GEN: Well nourished, well developed, in no acute distress  HEENT: normal  Neck: no JVD, carotid bruits, or masses Cardiac: RRR; no murmurs, rubs, or gallops,no edema  Respiratory:  clear to auscultation bilaterally, normal work of  breathing GI: soft, nontender, nondistended, + BS MS: no deformity or atrophy  Skin: warm and dry, no rash Neuro:  Strength and sensation are intact Psych: euthymic mood, full affect   EKG:  EKG is ordered today. The ekg ordered today demonstrates normal sinus rhythm with left atrial enlargement and poor R wave progression in the anterior leads.      Recent Labs: 11/16/2020: ALT 16; BUN 16; Creatinine, Ser 0.78; Hemoglobin 14.7; Platelets 164.0; Potassium 4.0; Sodium 138; TSH 1.40    Lipid Panel    Component Value Date/Time   CHOL 175 11/16/2020 1021   TRIG 101.0 11/16/2020 1021   HDL 59.00 11/16/2020 1021  CHOLHDL 3 11/16/2020 1021   VLDL 20.2 11/16/2020 1021   LDLCALC 95 11/16/2020 1021      Wt Readings from Last 3 Encounters:  02/02/21 120 lb 8 oz (54.7 kg)  11/16/20 120 lb 12.8 oz (54.8 kg)  08/31/20 116 lb (52.6 kg)      No flowsheet data found.    ASSESSMENT AND PLAN:  1.  Status post surgical resection of atrial myxoma: No evidence of recurrent myxoma on most recent echocardiogram in July 2022.  2.  Postoperative atrial fibrillation: No evidence of recurrent arrhythmia.  She is in sinus rhythm today.  3.  Hyperlipidemia: She is currently on atorvastatin 20 mg daily.  I reviewed most recent lipid profile done on November which showed an LDL of 95.  4. Essential hypertension: Blood pressure is controlled on current medications.     Disposition:   FU with me in 6 months  Signed,  [name], MD  02/02/2021 2:53 PM    [place]

## 2021-02-02 NOTE — Patient Instructions (Signed)

## 2021-02-22 ENCOUNTER — Ambulatory Visit: Payer: Medicare Other | Admitting: Dermatology

## 2021-03-01 DIAGNOSIS — H401112 Primary open-angle glaucoma, right eye, moderate stage: Secondary | ICD-10-CM | POA: Diagnosis not present

## 2021-03-01 DIAGNOSIS — Z961 Presence of intraocular lens: Secondary | ICD-10-CM | POA: Diagnosis not present

## 2021-03-01 DIAGNOSIS — H0288B Meibomian gland dysfunction left eye, upper and lower eyelids: Secondary | ICD-10-CM | POA: Diagnosis not present

## 2021-03-01 DIAGNOSIS — H02883 Meibomian gland dysfunction of right eye, unspecified eyelid: Secondary | ICD-10-CM | POA: Diagnosis not present

## 2021-03-01 DIAGNOSIS — H16223 Keratoconjunctivitis sicca, not specified as Sjogren's, bilateral: Secondary | ICD-10-CM | POA: Diagnosis not present

## 2021-03-01 DIAGNOSIS — H401123 Primary open-angle glaucoma, left eye, severe stage: Secondary | ICD-10-CM | POA: Diagnosis not present

## 2021-03-01 DIAGNOSIS — H02886 Meibomian gland dysfunction of left eye, unspecified eyelid: Secondary | ICD-10-CM | POA: Diagnosis not present

## 2021-03-01 DIAGNOSIS — H0288A Meibomian gland dysfunction right eye, upper and lower eyelids: Secondary | ICD-10-CM | POA: Diagnosis not present

## 2021-03-09 ENCOUNTER — Other Ambulatory Visit: Payer: Self-pay

## 2021-03-09 ENCOUNTER — Ambulatory Visit
Admission: RE | Admit: 2021-03-09 | Discharge: 2021-03-09 | Disposition: A | Discharge status: Home / Self Care | Payer: Medicare Other | Source: Ambulatory Visit | Attending: Internal Medicine | Admitting: Internal Medicine

## 2021-03-09 DIAGNOSIS — Z1231 Encounter for screening mammogram for malignant neoplasm of breast: Secondary | ICD-10-CM | POA: Insufficient documentation

## 2021-03-16 ENCOUNTER — Other Ambulatory Visit: Payer: Self-pay

## 2021-03-16 ENCOUNTER — Ambulatory Visit (INDEPENDENT_AMBULATORY_CARE_PROVIDER_SITE_OTHER): Payer: Medicare Other | Admitting: Dermatology

## 2021-03-16 DIAGNOSIS — Z85828 Personal history of other malignant neoplasm of skin: Secondary | ICD-10-CM

## 2021-03-16 DIAGNOSIS — L570 Actinic keratosis: Secondary | ICD-10-CM

## 2021-03-16 DIAGNOSIS — Z1283 Encounter for screening for malignant neoplasm of skin: Secondary | ICD-10-CM

## 2021-03-16 DIAGNOSIS — L820 Inflamed seborrheic keratosis: Secondary | ICD-10-CM | POA: Diagnosis not present

## 2021-03-16 DIAGNOSIS — L578 Other skin changes due to chronic exposure to nonionizing radiation: Secondary | ICD-10-CM

## 2021-03-16 DIAGNOSIS — D229 Melanocytic nevi, unspecified: Secondary | ICD-10-CM | POA: Diagnosis not present

## 2021-03-16 DIAGNOSIS — D492 Neoplasm of unspecified behavior of bone, soft tissue, and skin: Secondary | ICD-10-CM

## 2021-03-16 DIAGNOSIS — L814 Other melanin hyperpigmentation: Secondary | ICD-10-CM

## 2021-03-16 DIAGNOSIS — D1800 Hemangioma unspecified site: Secondary | ICD-10-CM | POA: Diagnosis not present

## 2021-03-16 DIAGNOSIS — L821 Other seborrheic keratosis: Secondary | ICD-10-CM | POA: Diagnosis not present

## 2021-03-16 NOTE — Progress Notes (Unsigned)
Follow-Up Visit   Subjective  Wanda Nelson is a 88 y.o. female who presents for the following: Annual Exam (Mole check ). Hx of SCC on the left distal forearm.  The patient presents for Total-Body Skin Exam (TBSE) for skin cancer screening and mole check.  The patient has spots, moles and lesions to be evaluated, some may be new or changing and the patient has concerns that these could be cancer.    The following portions of the chart were reviewed this encounter and updated as appropriate:       Review of Systems:  No other skin or systemic complaints except as noted in HPI or Assessment and Plan.  Objective  Well appearing patient in no apparent distress; mood and affect are within normal limits.  A full examination was performed including scalp, head, eyes, ears, nose, lips, neck, chest, axillae, abdomen, back, buttocks, bilateral upper extremities, bilateral lower extremities, hands, feet, fingers, toes, fingernails, and toenails. All findings within normal limits unless otherwise noted below.  face x 3 Erythematous thin papules/macules with gritty scale.   Left Upper Back x 1, right anterior shoulder x 2  (3) (3) Stuck-on, waxy, tan-brown papule or plaque --Discussed benign etiology and prognosis.     Assessment & Plan  AK (actinic keratosis) face x 3  Actinic keratoses are precancerous spots that appear secondary to cumulative UV radiation exposure/sun exposure over time. They are chronic with expected duration over 1 year. A portion of actinic keratoses will progress to squamous cell carcinoma of the skin. It is not possible to reliably predict which spots will progress to skin cancer and so treatment is recommended to prevent development of skin cancer.  Recommend daily broad spectrum sunscreen SPF 30+ to sun-exposed areas, reapply every 2 hours as needed.  Recommend staying in the shade or wearing long sleeves, sun glasses (UVA+UVB protection) and wide brim hats  (4-inch brim around the entire circumference of the hat). Call for new or changing lesions.   Destruction of lesion - face x 3 Complexity: simple   Destruction method: cryotherapy   Informed consent: discussed and consent obtained   Timeout:  patient name, date of birth, surgical site, and procedure verified Lesion destroyed using liquid nitrogen: Yes   Region frozen until ice ball extended beyond lesion: Yes   Outcome: patient tolerated procedure well with no complications   Post-procedure details: wound care instructions given    Inflamed seborrheic keratosis (3) Left Upper Back x 1, right anterior shoulder x 2  (3)  .  Neoplasm of skin left upper back medial to mid scapula  Suspect BCC at next office visit    Lentigines - Scattered tan macules - Due to sun exposure - Benign-appearing, observe - Recommend daily broad spectrum sunscreen SPF 30+ to sun-exposed areas, reapply every 2 hours as needed. - Call for any changes  Seborrheic Keratoses - Stuck-on, waxy, tan-brown papules and/or plaques  - Benign-appearing - Discussed benign etiology and prognosis. - Observe - Call for any changes  Melanocytic Nevi - Tan-brown and/or pink-flesh-colored symmetric macules and papules - Benign appearing on exam today - Observation - Call clinic for new or changing moles - Recommend daily use of broad spectrum spf 30+ sunscreen to sun-exposed areas.   Hemangiomas - Red papules - Discussed benign nature - Observe - Call for any changes  Actinic Damage - Chronic condition, secondary to cumulative UV/sun exposure - diffuse scaly erythematous macules with underlying dyspigmentation - Recommend daily broad spectrum sunscreen  SPF 30+ to sun-exposed areas, reapply every 2 hours as needed.  - Staying in the shade or wearing long sleeves, sun glasses (UVA+UVB protection) and wide brim hats (4-inch brim around the entire circumference of the hat) are also recommended for sun  protection.  - Call for new or changing lesions.  History of Squamous Cell Carcinoma of the Skin Left distal forearm 2021 - No evidence of recurrence today - No lymphadenopathy - Recommend regular full body skin exams - Recommend daily broad spectrum sunscreen SPF 30+ to sun-exposed areas, reapply every 2 hours as needed.  - Call if any new or changing lesions are noted between office visits     Skin cancer screening performed today.   Return in about 2 months (around 05/16/2021) for biopsy.  IMarye Round, CMA, am acting as scribe for Sarina Ser, MD .

## 2021-03-16 NOTE — Patient Instructions (Addendum)

## 2021-03-22 ENCOUNTER — Encounter: Payer: Self-pay | Admitting: Dermatology

## 2021-03-30 ENCOUNTER — Ambulatory Visit (INDEPENDENT_AMBULATORY_CARE_PROVIDER_SITE_OTHER): Payer: Medicare Other | Admitting: Podiatry

## 2021-03-30 ENCOUNTER — Other Ambulatory Visit: Payer: Self-pay

## 2021-03-30 ENCOUNTER — Encounter: Payer: Self-pay | Admitting: Podiatry

## 2021-03-30 DIAGNOSIS — M79675 Pain in left toe(s): Secondary | ICD-10-CM | POA: Diagnosis not present

## 2021-03-30 DIAGNOSIS — M79674 Pain in right toe(s): Secondary | ICD-10-CM | POA: Diagnosis not present

## 2021-03-30 DIAGNOSIS — B351 Tinea unguium: Secondary | ICD-10-CM | POA: Diagnosis not present

## 2021-03-30 NOTE — Progress Notes (Signed)
This patient returns to the office for evaluation and treatment of long thick painful nails .  This patient is unable to trim his own nails since the patient cannot reach his feet.  Patient says the nails are painful walking and wearing his shoes.  He returns for preventive foot care services.  General Appearance  Alert, conversant and in no acute stress.  Vascular  Dorsalis pedis and posterior tibial  pulses are palpable  bilaterally.  Capillary return is within normal limits  bilaterally. Temperature is within normal limits  bilaterally.  Neurologic  Senn-Weinstein monofilament wire test within normal limits  bilaterally. Muscle power within normal limits bilaterally.  Nails Thick disfigured discolored nails with subungual debris  from hallux to fifth toes bilaterally. No evidence of bacterial infection or drainage bilaterally.  Orthopedic  No limitations of motion  feet .  No crepitus or effusions noted.  No bony pathology or digital deformities noted.  Skin  normotropic skin with no porokeratosis noted bilaterally.  No signs of infections or ulcers noted.     Onychomycosis  Pain in toes right foot  Pain in toes left foot  Debridement  of nails  1-5  B/L with a nail nipper.  Nails were then filed using a dremel tool with no incidents.    RTC  3 months    Jary Louvier DPM  

## 2021-04-11 DIAGNOSIS — H6522 Chronic serous otitis media, left ear: Secondary | ICD-10-CM | POA: Diagnosis not present

## 2021-04-11 DIAGNOSIS — H6982 Other specified disorders of Eustachian tube, left ear: Secondary | ICD-10-CM | POA: Diagnosis not present

## 2021-04-11 DIAGNOSIS — H90A32 Mixed conductive and sensorineural hearing loss, unilateral, left ear with restricted hearing on the contralateral side: Secondary | ICD-10-CM | POA: Diagnosis not present

## 2021-04-11 DIAGNOSIS — H73892 Other specified disorders of tympanic membrane, left ear: Secondary | ICD-10-CM | POA: Diagnosis not present

## 2021-04-11 DIAGNOSIS — H7192 Unspecified cholesteatoma, left ear: Secondary | ICD-10-CM | POA: Diagnosis not present

## 2021-04-17 ENCOUNTER — Ambulatory Visit (INDEPENDENT_AMBULATORY_CARE_PROVIDER_SITE_OTHER): Payer: Medicare Other

## 2021-04-17 VITALS — BP 127/70 | HR 55 | Ht 62.0 in | Wt 115.0 lb

## 2021-04-17 DIAGNOSIS — Z0000 Encounter for general adult medical examination without abnormal findings: Secondary | ICD-10-CM

## 2021-04-17 NOTE — Progress Notes (Addendum)
Subjective:   Wanda Nelson is a 88 y.o. female who presents for Medicare Annual (Subsequent) preventive examination.  Review of Systems    No ROS.  Medicare Wellness Virtual Visit.  Visual/audio telehealth visit, UTA vital signs.   See social history for additional risk factors.   Cardiac Risk Factors include: advanced age (>77men, >75 women)     Objective:    Today's Vitals   04/17/21 0947  BP: 127/70  Pulse: (!) 55  Weight: 115 lb (52.2 kg)  Height: 5\' 2"  (1.575 m)   Body mass index is 21.03 kg/m.     04/17/2021    9:57 AM 08/31/2020    2:42 PM 04/14/2020   10:16 AM 04/14/2019    9:39 AM 03/31/2018    4:35 AM 03/21/2018    7:41 AM 03/19/2018    3:37 PM  Advanced Directives  Does Patient Have a Medical Advance Directive? Yes No Yes Yes Yes Yes Yes  Type of Estate agent of [place];Living will  Healthcare Power of [place];Living will Healthcare Power of [place];Living will Healthcare Power of [place];Living will Healthcare Power of [place];Living will Healthcare Power of [place];Living will  Does patient want to make changes to medical advance directive? No - Patient declined  No - Patient declined No - Patient declined  No - Patient declined No - Patient declined  Copy of Healthcare Power of Attorney in Chart? Yes - validated most recent copy scanned in chart (See row information)  Yes - validated most recent copy scanned in chart (See row information) Yes - validated most recent copy scanned in chart (See row information) No - copy requested  No - copy requested  Would patient like information on creating a medical advance directive?      No - Patient declined     Current Medications (verified) Outpatient Encounter Medications as of 04/17/2021  Medication Sig   Ascorbic Acid (VITAMIN C PO) Take by mouth daily at 12 noon.   aspirin EC 81 MG tablet Take 1 tablet (81 mg total) by mouth daily.   atorvastatin (LIPITOR) 20 MG tablet Take 1 tablet  (20 mg total) by mouth daily at 6 PM.   Calcium Carbonate-Vitamin D 500-125 MG-UNIT TABS Take 1 tablet by mouth daily.   cholecalciferol (VITAMIN D) 1000 units tablet Take 1,000 Units by mouth daily.   ferrous sulfate (FEROSUL) 325 (65 FE) MG tablet Take 1 tablet (325 mg total) by mouth daily with breakfast. With vitamin C 500 mg daily   Flaxseed, Linseed, (FLAX SEED OIL) 1300 MG CAPS Take 1,100 mg by mouth daily.    losartan (COZAAR) 25 MG tablet Take 0.5 tablets (12.5 mg total) by mouth daily. Dc norvasc 2.5 mg qd   Multiple Vitamin (MULTIVITAMIN) tablet Take 1 tablet by mouth daily.   Omega-3 Fatty Acids (FISH OIL) 1200 MG CAPS Take by mouth daily at 8 pm.   omeprazole (PRILOSEC) 20 MG capsule Take 1 capsule (20 mg total) by mouth daily. 30 minutes before breakfast prn   [DISCONTINUED] amLODipine (NORVASC) 2.5 MG tablet Take 1 tablet (2.5 mg total) by mouth daily.   [DISCONTINUED] ipratropium (ATROVENT) 0.03 % nasal spray    No facility-administered encounter medications on file as of 04/17/2021.    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   Squamous cell carcinoma of skin 11/12/2019   L distal dorsum forearm - ED&C    [facility]roke [facility])    Past Surgical History:  Procedure Laterality Date   ABDOMINAL HYSTERECTOMY  1982   total   BUNIONECTOMY Bilateral    CLIPPING OF ATRIAL APPENDAGE  03/21/2018   Procedure: Clipping Of Atrial Appendage - using an AtriCure 45mm clip;  Surgeon: Purcell Nails, MD;   Location: [facility] OR;  Service: Open Heart Surgery;;   EXCISION OF ATRIAL MYXOMA N/A 03/21/2018   Procedure: Excision Of Atrial Myxoma;  Surgeon: Purcell Nails, MD;  Location: [facility] OR;  Service: Open Heart Surgery;  Laterality: N/A;   RECTOCELE REPAIR     RIGHT/LEFT HEART CATH AND CORONARY ANGIOGRAPHY N/A 03/19/2018   Procedure: RIGHT/LEFT HEART CATH AND CORONARY ANGIOGRAPHY;  Surgeon: Iran Ouch, MD;  Location: [facility] INVASIVE CV LAB;  Service: Cardiovascular;  Laterality: N/A;   TEE WITHOUT CARDIOVERSION N/A 03/21/2018   Procedure: TRANSESOPHAGEAL ECHOCARDIOGRAM (TEE);  Surgeon: Purcell Nails, MD;  Location: [facility] OR;  Service: Open Heart Surgery;  Laterality: N/A;   TONSILLECTOMY     Family History  Problem Relation Age of Onset   Colon cancer Brother    Cancer Brother        neck tumor cancerous    Esophageal varices Brother    Colon cancer Maternal Uncle    Colon cancer Maternal Grandmother    Kidney disease Brother    Coronary artery disease Brother        with stent, cabg   Breast cancer Neg Hx    Social History   Socioeconomic History   Marital status: Widowed    Spouse name: Not on file   Number of children: Not on file   Years of education: Not on file   Highest education level: Not on file  Occupational History   Not on file  Tobacco Use   Smoking status: Never   Smokeless tobacco: Never  Vaping Use   Vaping Use: Never used  Substance and Sexual Activity   Alcohol use: No    Alcohol/week: 0.0 standard drinks   Drug use: No   Sexual activity: Not Currently  Other Topics Concern   Not on file  Social History Narrative   [place],  husband died 12/18/18 due to brain hemorrhage   Daughter in law Ronia Ferry    widowed   Social Determinants of Health   Financial Resource Strain: Low Risk    Difficulty of Paying Living Expenses: Not hard at all  Food Insecurity: No Food Insecurity   Worried About Programme researcher, broadcasting/film/video in the Last Year: Never true   Occupational psychologist in the Last Year: Never true  Transportation Needs: No Transportation Needs   Lack of Transportation (Medical): No   Lack of Transportation (Non-Medical): No  Physical Activity: Sufficiently Active   Days of Exercise per Week: 5 days   Minutes of Exercise per Session: 60 min  Stress: No Stress Concern Present   Feeling of Stress : Not at all  Social Connections: Unknown   Frequency of Communication with Friends and  Family: More than three times a week   Frequency of Social Gatherings with Friends and Family: More than three times a week   Attends Religious Services: Not on file   Active Member of Clubs or Organizations: Not on file   Attends Banker Meetings: Not on file   Marital Status: Widowed   Tobacco Counseling Counseling given: Not Answered  Clinical Intake: Pre-visit preparation completed: Yes        Diabetes: No  How often do you need to have someone help you when you read instructions, pamphlets, or other written materials from your doctor or pharmacy?: 1 - Never  Interpreter Needed?: No      Activities of Daily Living    04/17/2021    9:50 AM  In your present state of health, do you have any difficulty performing the following activities:  Hearing? 1  Comment L ear deafness. Wears L hearing aid.  Comment Difficulty seeing at night. Does not drive after dark. Wears glasses.  Difficulty concentrating or making decisions? 0  Walking or climbing stairs? 0  Dressing or bathing? 0  Doing errands, shopping? 0  Preparing Food and eating ? N  Using the Toilet? N  In the past six months, have you accidently leaked urine? N  Do you have problems with loss of bowel control? N  Managing your Medications? N  Managing your Finances? N  Housekeeping or managing your Housekeeping? N  Comment Maid assist once monthly per preference   Patient Care Team: McLean-Scocuzza, Pasty Spillers, MD as PCP - General (Internal Medicine) Iran Ouch, MD as  PCP - Cardiology (Cardiology)  Indicate any recent Medical Services you may have received from other than [facility] providers in the past year (date may be approximate).     Assessment:   This is a routine wellness examination for [place].  Virtual Visit via Telephone Note  I connected with  [name] on 04/17/21 at  9:45 AM EDT by telephone and verified that I am speaking with the correct person using two identifiers.  Persons participating in the virtual visit: patient/Nurse Health Advisor   I discussed the limitations of performing an evaluation and management service by telehealth. The patient expressed understanding and agreed to proceed. We continued and completed visit with audio only. Some vital signs may be absent or patient reported.   Hearing/Vision screen Hearing Screening - Comments:: Left ear hearing aid Vision Screening - Comments:: Wears corrective lenses Cataract extraction, bilateral Glaucoma; visits every 4 months They have seen their ophthalmologist in the last 12 months.   Dietary issues and exercise activities discussed: Current Exercise Habits: Home exercise routine, Type of exercise: walking, Intensity: Mild Regular diet Good water intake   Goals Addressed               This Visit's Progress     Patient Stated     I want to maintain my weight (pt-stated)   On track     Stay hydrated and drink plenty of water       Depression Screen    04/17/2021    9:49 AM 08/31/2020    8:36 AM 05/31/2020    8:19 AM 04/14/2020    9:38 AM 05/05/2019   10:41 AM 04/14/2019    9:33 AM 09/26/2018   10:05 AM  PHQ 2/9 Scores  PHQ - 2 Score 0 0 0 0 0 0 0    Fall Risk    04/17/2021    9:48 AM  11/16/2020    9:41 AM 08/31/2020    8:36 AM 06/15/2020    8:09 AM 05/31/2020    8:19 AM  Fall Risk   Falls in the past year? 0 0 0 0 0  Number falls in past yr: 0 0 0 0 0  Injury with Fall?  0 0 0 0  Risk for fall due to :  No Fall Risks     Follow up Falls  evaluation completed Falls evaluation completed Falls evaluation completed Falls evaluation completed Falls evaluation completed    FALL RISK PREVENTION PERTAINING TO THE HOME: Home free of loose throw rugs in walkways, pet beds, electrical cords, etc? Yes  Adequate lighting in your home to reduce risk of falls? Yes   ASSISTIVE DEVICES UTILIZED TO PREVENT FALLS: Life alert? No  Use of a cane, walker or w/c? No  Grab bars in the bathroom? No  Shower chair or bench in shower? No  Elevated toilet seat or a handicapped toilet? No   TIMED UP AND GO: Was the test performed? No .   Cognitive Function:  Patient is alert and oriented x3.  Lives alone and manages her own finances and medication.       04/14/2019    9:49 AM  6CIT Screen  What Year? 0 points  What month? 0 points  What time? 0 points  Count back from 20 0 points  Months in reverse 0 points  Repeat phrase 0 points  Total Score 0 points    Immunizations Immunization History  Administered Date(s) Administered   Influenza, High Dose Seasonal PF 10/27/2019, 10/20/2020   Influenza,inj,Quad PF,6+ Mos 09/22/2015   Influenza-Unspecified 10/08/2017, 10/27/2018   Moderna Sars-Covid-2 Vaccination 01/23/2019, 02/20/2019, 11/24/2019, 05/07/2020   Pfizer Covid-19 Vaccine Bivalent Booster 64yrs & up 09/29/2020   Pneumococcal Conjugate-13 03/24/2014   Pneumococcal-Unspecified 11/17/2014   Tdap 05/02/2020   Zoster Recombinat (Shingrix) 09/30/2017, 12/23/2017   PNA vaccine- notes previously completed. Deferred health maintenance update for next office visit.   Screening Tests Health Maintenance  Topic Date Due   Pneumonia Vaccine 42+ Years old (2 - PPSV23 if available, else PCV20) 05/15/2021 (Originally 03/24/2015)   INFLUENZA VACCINE  08/08/2021   TETANUS/TDAP  05/03/2030   COVID-19 Vaccine  Completed   Zoster Vaccines- Shingrix  Completed   DEXA SCAN  Addressed   HPV VACCINES  Aged Out   Health Maintenance There are no  preventive care reminders to display for this patient.  Mammogram- completed 03/09/21. [facility].  Lung Cancer Screening: (Low Dose CT Chest recommended if Age 86-80 years, 30 pack-year currently smoking OR have quit w/in 15years.) does not qualify.   Hepatitis C Screening: does not qualify.  Vision Screening: Recommended annual ophthalmology exams for early detection of glaucoma and other disorders of the eye.  Dental Screening: Recommended annual dental exams for proper oral hygiene  Community Resource Referral / Chronic Care Management: CRR required this visit?  No   CCM required this visit?  No      Plan:   Keep all routine maintenance appointments.   Patient reports becoming more winded or short of breath since return of her trip from out of town a couple of days ago. Notes taking covid test with negative results, plans to test again. Denies chest pain or palpitations and all other symptoms. Walked 3 miles this morning for exercise. Not in distress. Plans to monitor and go to ED if symptoms worsening. Agrees to follow up with PCP as needed prior to  next scheduled office visit 05/16/21.   I have personally reviewed and noted the following in the patient's chart:   Medical and social history Use of alcohol, tobacco or illicit drugs  Current medications and supplements including opioid prescriptions.  Functional ability and status Nutritional status Physical activity Advanced directives List of other physicians Hospitalizations, surgeries, and ER visits in previous 12 months Vitals Screenings to include cognitive, depression, and falls Referrals and appointments  In addition, I have reviewed and discussed with patient certain preventive protocols, quality metrics, and best practice recommendations. A written personalized care plan for preventive services as well as general preventive health recommendations were provided to patient.     OBrien-Blaney, Bernie Fobes L, LPN   1/61/0960      I have reviewed the above information and agree with above.   Duncan Dull, MD

## 2021-04-17 NOTE — Patient Instructions (Addendum)
?  Ms. Hippert , ?Thank you for taking time to come for your Medicare Wellness Visit. I appreciate your ongoing commitment to your health goals. Please review the following plan we discussed and let me know if I can assist you in the future.  ? ?These are the goals we discussed: ? Goals   ? ?  ? Patient Stated  ?   I want to maintain my weight (pt-stated)   ?   Stay hydrated and drink plenty of water ?  ? ?  ?  ?This is a list of the screening recommended for you and due dates:  ?Health Maintenance  ?Topic Date Due  ? Pneumonia Vaccine (2 - PPSV23 if available, else PCV20) 05/15/2021*  ? Flu Shot  08/08/2021  ? Tetanus Vaccine  05/03/2030  ? COVID-19 Vaccine  Completed  ? Zoster (Shingles) Vaccine  Completed  ? DEXA scan (bone density measurement)  Addressed  ? HPV Vaccine  Aged Out  ?*Topic was postponed. The date shown is not the original due date.  ?  ?

## 2021-05-04 DIAGNOSIS — H90A31 Mixed conductive and sensorineural hearing loss, unilateral, right ear with restricted hearing on the contralateral side: Secondary | ICD-10-CM | POA: Diagnosis not present

## 2021-05-08 ENCOUNTER — Emergency Department: Payer: Medicare Other

## 2021-05-08 ENCOUNTER — Observation Stay: Admit: 2021-05-08 | Payer: Medicare Other

## 2021-05-08 ENCOUNTER — Observation Stay
Admission: EM | Admit: 2021-05-08 | Discharge: 2021-05-09 | Disposition: A | Discharge status: Home / Self Care | Payer: Medicare Other | Attending: Student in an Organized Health Care Education/Training Program | Admitting: Student in an Organized Health Care Education/Training Program

## 2021-05-08 ENCOUNTER — Encounter: Payer: Self-pay | Admitting: Internal Medicine

## 2021-05-08 ENCOUNTER — Other Ambulatory Visit: Payer: Self-pay

## 2021-05-08 ENCOUNTER — Observation Stay (HOSPITAL_BASED_OUTPATIENT_CLINIC_OR_DEPARTMENT_OTHER)
Admit: 2021-05-08 | Discharge: 2021-05-08 | Disposition: A | Discharge status: Home / Self Care | Payer: Medicare Other | Attending: Internal Medicine | Admitting: Internal Medicine

## 2021-05-08 DIAGNOSIS — Z8673 Personal history of transient ischemic attack (TIA), and cerebral infarction without residual deficits: Secondary | ICD-10-CM | POA: Diagnosis not present

## 2021-05-08 DIAGNOSIS — J439 Emphysema, unspecified: Secondary | ICD-10-CM | POA: Diagnosis not present

## 2021-05-08 DIAGNOSIS — R0609 Other forms of dyspnea: Secondary | ICD-10-CM | POA: Insufficient documentation

## 2021-05-08 DIAGNOSIS — I517 Cardiomegaly: Secondary | ICD-10-CM | POA: Diagnosis not present

## 2021-05-08 DIAGNOSIS — R262 Difficulty in walking, not elsewhere classified: Secondary | ICD-10-CM | POA: Diagnosis not present

## 2021-05-08 DIAGNOSIS — K449 Diaphragmatic hernia without obstruction or gangrene: Secondary | ICD-10-CM | POA: Diagnosis not present

## 2021-05-08 DIAGNOSIS — R0902 Hypoxemia: Secondary | ICD-10-CM | POA: Diagnosis not present

## 2021-05-08 DIAGNOSIS — Z79899 Other long term (current) drug therapy: Secondary | ICD-10-CM | POA: Diagnosis not present

## 2021-05-08 DIAGNOSIS — Z85828 Personal history of other malignant neoplasm of skin: Secondary | ICD-10-CM | POA: Insufficient documentation

## 2021-05-08 DIAGNOSIS — Z7982 Long term (current) use of aspirin: Secondary | ICD-10-CM | POA: Diagnosis not present

## 2021-05-08 DIAGNOSIS — J8410 Pulmonary fibrosis, unspecified: Secondary | ICD-10-CM | POA: Diagnosis not present

## 2021-05-08 DIAGNOSIS — Z8616 Personal history of COVID-19: Secondary | ICD-10-CM | POA: Diagnosis not present

## 2021-05-08 DIAGNOSIS — R0602 Shortness of breath: Secondary | ICD-10-CM | POA: Diagnosis not present

## 2021-05-08 DIAGNOSIS — I10 Essential (primary) hypertension: Secondary | ICD-10-CM

## 2021-05-08 DIAGNOSIS — R0600 Dyspnea, unspecified: Secondary | ICD-10-CM

## 2021-05-08 DIAGNOSIS — R7989 Other specified abnormal findings of blood chemistry: Secondary | ICD-10-CM | POA: Diagnosis not present

## 2021-05-08 DIAGNOSIS — J84112 Idiopathic pulmonary fibrosis: Secondary | ICD-10-CM | POA: Diagnosis present

## 2021-05-08 DIAGNOSIS — Z8719 Personal history of other diseases of the digestive system: Secondary | ICD-10-CM

## 2021-05-08 LAB — CBC WITH DIFFERENTIAL/PLATELET
Abs Immature Granulocytes: 0.01 10*3/uL (ref 0.00–0.07)
Basophils Absolute: 0.1 10*3/uL (ref 0.0–0.1)
Basophils Relative: 1 %
Eosinophils Absolute: 0.6 10*3/uL — ABNORMAL HIGH (ref 0.0–0.5)
Eosinophils Relative: 8 %
HCT: 48.1 % — ABNORMAL HIGH (ref 36.0–46.0)
Hemoglobin: 15.9 g/dL — ABNORMAL HIGH (ref 12.0–15.0)
Immature Granulocytes: 0 %
Lymphocytes Relative: 29 %
Lymphs Abs: 1.9 10*3/uL (ref 0.7–4.0)
MCH: 31 pg (ref 26.0–34.0)
MCHC: 33.1 g/dL (ref 30.0–36.0)
MCV: 93.8 fL (ref 80.0–100.0)
Monocytes Absolute: 0.7 10*3/uL (ref 0.1–1.0)
Monocytes Relative: 10 %
Neutro Abs: 3.5 10*3/uL (ref 1.7–7.7)
Neutrophils Relative %: 52 %
Platelets: 191 10*3/uL (ref 150–400)
RBC: 5.13 MIL/uL — ABNORMAL HIGH (ref 3.87–5.11)
RDW: 12.6 % (ref 11.5–15.5)
WBC: 6.7 10*3/uL (ref 4.0–10.5)
nRBC: 0 % (ref 0.0–0.2)

## 2021-05-08 LAB — ECHOCARDIOGRAM COMPLETE
AR max vel: 1.53 cm2
AV Area VTI: 1.61 cm2
AV Area mean vel: 1.41 cm2
AV Mean grad: 2 mmHg
AV Peak grad: 4.1 mmHg
Ao pk vel: 1.01 m/s
Area-P 1/2: 5.13 cm2
Height: 62 in
MV VTI: 1.44 cm2
S' Lateral: 2.25 cm
Weight: 1792 oz

## 2021-05-08 LAB — COMPREHENSIVE METABOLIC PANEL
ALT: 25 U/L (ref 0–44)
AST: 29 U/L (ref 15–41)
Albumin: 3.9 g/dL (ref 3.5–5.0)
Alkaline Phosphatase: 97 U/L (ref 38–126)
Anion gap: 7 (ref 5–15)
BUN: 17 mg/dL (ref 8–23)
CO2: 26 mmol/L (ref 22–32)
Calcium: 9.2 mg/dL (ref 8.9–10.3)
Chloride: 104 mmol/L (ref 98–111)
Creatinine, Ser: 0.86 mg/dL (ref 0.44–1.00)
GFR, Estimated: >60 mL/min (ref >60)
Glucose, Bld: 91 mg/dL (ref 70–99)
Potassium: 3.9 mmol/L (ref 3.5–5.1)
Sodium: 137 mmol/L (ref 135–145)
Total Bilirubin: 0.8 mg/dL (ref 0.3–1.2)
Total Protein: 7.9 g/dL (ref 6.5–8.1)

## 2021-05-08 LAB — RESPIRATORY PANEL BY PCR

## 2021-05-08 LAB — TROPONIN I (HIGH SENSITIVITY)
Troponin I (High Sensitivity): 6 ng/L (ref <18)
Troponin I (High Sensitivity): 5 ng/L (ref <18)

## 2021-05-08 LAB — C-REACTIVE PROTEIN: CRP: <0.5 mg/dL (ref <1.0)

## 2021-05-08 LAB — D-DIMER, QUANTITATIVE: D-Dimer, Quant: 0.63 ug/mL-FEU — ABNORMAL HIGH (ref 0.00–0.50)

## 2021-05-08 LAB — BRAIN NATRIURETIC PEPTIDE: B Natriuretic Peptide: 69.7 pg/mL (ref 0.0–100.0)

## 2021-05-08 MED ORDER — ONDANSETRON HCL 4 MG/2ML IJ SOLN: 4.0000 mg | Freq: Four times a day (QID) | INTRAMUSCULAR | Status: DC | PRN

## 2021-05-08 MED ORDER — ATORVASTATIN CALCIUM 20 MG PO TABS
20.0000 mg | ORAL_TABLET | Freq: Every day | ORAL | Status: DC
  Administered 2021-05-08 – 2021-05-09 (×2): 20 mg via ORAL
  Filled 2021-05-08 (×2): qty 1

## 2021-05-08 MED ORDER — ACETAMINOPHEN 325 MG PO TABS
650.0000 mg | ORAL_TABLET | Freq: Four times a day (QID) | ORAL | Status: DC | PRN
  Administered 2021-05-08 – 2021-05-09 (×2): 650 mg via ORAL
  Filled 2021-05-08 (×2): qty 2

## 2021-05-08 MED ORDER — SODIUM CHLORIDE 0.9% FLUSH: 3.0000 mL | INTRAVENOUS | Status: DC | PRN

## 2021-05-08 MED ORDER — LOSARTAN POTASSIUM 25 MG PO TABS
12.5000 mg | ORAL_TABLET | Freq: Every day | ORAL | Status: DC
  Administered 2021-05-08 – 2021-05-09 (×2): 12.5 mg via ORAL
  Filled 2021-05-08: qty 0.5
  Filled 2021-05-08: qty 1

## 2021-05-08 MED ORDER — ONDANSETRON HCL 4 MG PO TABS: 4.0000 mg | ORAL_TABLET | Freq: Four times a day (QID) | ORAL | Status: DC | PRN

## 2021-05-08 MED ORDER — ASCORBIC ACID 500 MG PO TABS
500.0000 mg | ORAL_TABLET | Freq: Every day | ORAL | Status: DC
  Administered 2021-05-08 – 2021-05-09 (×2): 500 mg via ORAL
  Filled 2021-05-08 (×2): qty 1

## 2021-05-08 MED ORDER — SODIUM CHLORIDE 0.9% FLUSH
3.0000 mL | Freq: Two times a day (BID) | INTRAVENOUS | Status: DC
  Administered 2021-05-08 – 2021-05-09 (×2): 3 mL via INTRAVENOUS

## 2021-05-08 MED ORDER — PREDNISONE 20 MG PO TABS
40.0000 mg | ORAL_TABLET | Freq: Every day | ORAL | Status: DC
  Administered 2021-05-09: 40 mg via ORAL
  Filled 2021-05-08: qty 2

## 2021-05-08 MED ORDER — ACETAMINOPHEN 650 MG RE SUPP: 650.0000 mg | Freq: Four times a day (QID) | RECTAL | Status: DC | PRN

## 2021-05-08 MED ORDER — AEROCHAMBER PLUS FLO-VU MISC
1.0000 | Freq: Once | Status: AC
  Administered 2021-05-08: 1
  Filled 2021-05-08 (×3): qty 1

## 2021-05-08 MED ORDER — SODIUM CHLORIDE 0.9 % IV SOLN: 250.0000 mL | INTRAVENOUS | Status: DC | PRN

## 2021-05-08 MED ORDER — IPRATROPIUM-ALBUTEROL 0.5-2.5 (3) MG/3ML IN SOLN: 3.0000 mL | Freq: Four times a day (QID) | RESPIRATORY_TRACT | Status: DC | PRN

## 2021-05-08 MED ORDER — PANTOPRAZOLE SODIUM 40 MG PO TBEC
40.0000 mg | DELAYED_RELEASE_TABLET | Freq: Every day | ORAL | Status: DC
  Administered 2021-05-08: 40 mg via ORAL
  Filled 2021-05-08: qty 1

## 2021-05-08 MED ORDER — OYSTER SHELL CALCIUM/D3 500-5 MG-MCG PO TABS
1.0000 | ORAL_TABLET | Freq: Every day | ORAL | Status: DC
  Administered 2021-05-08 – 2021-05-09 (×2): 1 via ORAL
  Filled 2021-05-08 (×2): qty 1

## 2021-05-08 MED ORDER — FERROUS SULFATE 325 (65 FE) MG PO TABS: 325.0000 mg | ORAL_TABLET | Freq: Every day | ORAL | Status: DC

## 2021-05-08 MED ORDER — ENOXAPARIN SODIUM 40 MG/0.4ML IJ SOSY
40.0000 mg | PREFILLED_SYRINGE | INTRAMUSCULAR | Status: DC
  Administered 2021-05-08: 40 mg via SUBCUTANEOUS
  Filled 2021-05-08: qty 0.4

## 2021-05-08 MED ORDER — MOMETASONE FURO-FORMOTEROL FUM 100-5 MCG/ACT IN AERO
2.0000 | INHALATION_SPRAY | Freq: Two times a day (BID) | RESPIRATORY_TRACT | Status: DC
  Administered 2021-05-08 – 2021-05-09 (×2): 2 via RESPIRATORY_TRACT
  Filled 2021-05-08: qty 8.8

## 2021-05-08 MED ORDER — AZITHROMYCIN 500 MG PO TABS
500.0000 mg | ORAL_TABLET | Freq: Every day | ORAL | Status: DC
  Administered 2021-05-08 – 2021-05-09 (×2): 500 mg via ORAL
  Filled 2021-05-08 (×2): qty 1

## 2021-05-08 MED ORDER — OMEGA-3-ACID ETHYL ESTERS 1 G PO CAPS
1.0000 g | ORAL_CAPSULE | Freq: Every day | ORAL | Status: DC
  Administered 2021-05-08 – 2021-05-09 (×2): 1 g via ORAL
  Filled 2021-05-08 (×2): qty 1

## 2021-05-08 MED ORDER — ASPIRIN EC 81 MG PO TBEC
81.0000 mg | DELAYED_RELEASE_TABLET | Freq: Every day | ORAL | Status: DC
  Administered 2021-05-08 – 2021-05-09 (×2): 81 mg via ORAL
  Filled 2021-05-08 (×2): qty 1

## 2021-05-08 MED ORDER — VITAMIN D 25 MCG (1000 UNIT) PO TABS
1000.0000 [IU] | ORAL_TABLET | Freq: Every day | ORAL | Status: DC
  Administered 2021-05-09: 1000 [IU] via ORAL
  Filled 2021-05-08: qty 1

## 2021-05-08 MED ORDER — ADULT MULTIVITAMIN W/MINERALS CH
1.0000 | ORAL_TABLET | Freq: Every day | ORAL | Status: DC
  Administered 2021-05-08 – 2021-05-09 (×2): 1 via ORAL
  Filled 2021-05-08 (×2): qty 1

## 2021-05-08 MED ORDER — METHYLPREDNISOLONE SODIUM SUCC 40 MG IJ SOLR
40.0000 mg | Freq: Two times a day (BID) | INTRAMUSCULAR | Status: AC
  Administered 2021-05-08 – 2021-05-09 (×2): 40 mg via INTRAVENOUS
  Filled 2021-05-08 (×2): qty 1

## 2021-05-08 MED ORDER — IOHEXOL 350 MG/ML SOLN
75.0000 mL | Freq: Once | INTRAVENOUS | Status: AC | PRN
  Administered 2021-05-08: 75 mL via INTRAVENOUS

## 2021-05-08 MED ORDER — HYDRALAZINE HCL 20 MG/ML IJ SOLN
5.0000 mg | Freq: Four times a day (QID) | INTRAMUSCULAR | Status: DC | PRN
  Administered 2021-05-08: 5 mg via INTRAVENOUS
  Filled 2021-05-08: qty 1

## 2021-05-08 NOTE — ED Provider Notes (Signed)
? ?[facility] ?Provider Note ? ? ? Event Date/Time  ? First MD Initiated Contact with Patient 05/08/21 [phone]   ?  (approximate) ? ? ?History  ? ?Shortness of Breath ? ? ?HPI ? ?Wanda Nelson is a 88 y.o. female with a past history of bronchitis anemia and removal of atrial myxoma reports shortness of breath gradually worsening over the last several weeks.  This past weekend got markedly worse and she was unable to walk without getting very short of breath.  She reports a mild nonproductive cough and no other symptoms.  She has no chest pain or tightness. ? ?  ? ? ?Physical Exam  ? ?Triage Vital Signs: ?ED Triage Vitals  ?Enc Vitals Group  ?   BP 05/08/21 0650 (!) 191/72  ?   Pulse Rate 05/08/21 0650 (!) 58  ?   Resp 05/08/21 0650 20  ?   Temp 05/08/21 0650 97.8 ?F (36.6 ?C)  ?   Temp Source 05/08/21 0650 Oral  ?   SpO2 05/08/21 0650 98 %  ?   Weight 05/08/21 0648 112 lb (50.8 kg)  ?   Height 05/08/21 0648 '5\' 2"'$  (1.575 m)  ?   Head Circumference --   ?   Peak Flow --   ?   Pain Score 05/08/21 0648 0  ?   Pain Loc --   ?   Pain Edu? --   ?   Excl. in [place]? --   ? ? ?Most recent vital signs: ?Vitals:  ? 05/08/21 1000 05/08/21 1100  ?BP: (!) 196/79 (!) 186/67  ?Pulse: 66 (!) 56  ?Resp: 17 (!) 25  ?Temp:    ?SpO2: 94% 95%  ? ? ? ?General: Awake, no distress.  ?CV:  Good peripheral perfusion.  ?Resp:  Normal effort.  Lungs mostly clear there is some crackles in both bases ?Abd:  No distention.  Soft nontender no organomegaly ?Extremities with no edema or pain ? ? ?ED Results / Procedures / Treatments  ? ?Labs ?(all labs ordered are listed, but only abnormal results are displayed) ?Labs Reviewed  ?CBC WITH DIFFERENTIAL/PLATELET - Abnormal; Notable for the following components:  ?    Result Value  ? RBC 5.13 (*)   ? Hemoglobin 15.9 (*)   ? HCT 48.1 (*)   ? Eosinophils Absolute 0.6 (*)   ? All other components within normal limits  ?D-DIMER, QUANTITATIVE - Abnormal; Notable for the following  components:  ? D-Dimer, Quant 0.63 (*)   ? All other components within normal limits  ?COMPREHENSIVE METABOLIC PANEL  ?BRAIN NATRIURETIC PEPTIDE  ?TROPONIN I (HIGH SENSITIVITY)  ?TROPONIN I (HIGH SENSITIVITY)  ? ? ? ?EKG ? ?EKG read interpreted by me shows sinus bradycardia rate of 56 slight rightward axis flipped T's in 1 and L which are new and either biphasic or U waves in 3 and F. ? ? ?RADIOLOGY ?Chest x-ray does not show a whole lot to me although there is some either coarsening of the interstitial findings or possibly increased interstitial markings.  I will add a BNP to see if this could be due to CHF. ? ? ?PROCEDURES: ? ?Critical Care performed:  ? ?Procedures ? ? ?MEDICATIONS ORDERED IN ED: ?Medications  ?iohexol (OMNIPAQUE) 350 MG/ML injection 75 mL (75 mLs Intravenous Contrast Given 05/08/21 0948)  ? ? ? ?IMPRESSION / MDM / ASSESSMENT AND PLAN / ED COURSE  ?I reviewed the triage vital signs and the nursing notes. ? ?Review of old  records shows a note from Dr. Fletcher Anon includes the following: ?An echocardiogram was done in July 2022 which showed normal LV systolic function, grade 1 diastolic dysfunction and mild pulmonary hypertension.  There was moderate tricuspid regurgitation. ? ? ?The patient is on the cardiac monitor to evaluate for evidence of arrhythmia and/or significant heart rate changes.  None were seen.  However we did have the patient on the pulse ox and walker back-and-forth in front of the stretcher while she was on the pulse ox and the monitor.  This resulted in a pulse ox reading that dropped down to 85 from her usual 95.  There was a good waveform. ? ?It is possible that the patient is hypoxic because of the large hiatal hernia however there does appear to be a good deal of intact lung on my review of the patient's chest x-ray.  I listen to the patient's lungs immediately after her exercise and there was no wheezing with good air movement.  CT did not show any PEs or anything else.  I am  wondering if it could possibly be some COPD or emphysema.  I discussed her with Dr. Mariea Clonts.  He agrees that it is likely not cardiac.  He can see her and get another echo however.  More likely a pulmonary consult will be more likely to result in improvement in her situation. ? ? ?FINAL CLINICAL IMPRESSION(S) / ED DIAGNOSES  ? ?Final diagnoses:  ?Dyspnea, unspecified type  ?Hypoxia  ? ? ? ?Rx / DC Orders  ? ?ED Discharge Orders   ? ? None  ? ?  ? ? ? ?Note:  This document was prepared using Dragon voice recognition software and may include unintentional dictation errors. ?  ?Nena Polio, MD ?05/08/21 1123 ? ?

## 2021-05-08 NOTE — ED Notes (Signed)
Lunch tray at bedside. ?

## 2021-05-08 NOTE — H&P (Signed)
?History and Physical  ? ? ?Patient: Wanda Nelson CBJ:[id number] DOB: Sep 02, 1931 ?DOA: 05/08/2021 ?DOS: the patient was seen and examined on 05/08/2021 ?PCP: McLean-Scocuzza, Nino Glow, MD  ?Patient coming from: Home ? ?Chief Complaint:  ?Chief Complaint  ?Patient presents with  ? Shortness of Breath  ? ?HPI: Wanda Nelson is a 88 y.o. female with medical history significant for brain aneurysm, dyslipidemia, pulmonary fibrosis and status post left atrial myxoma resection who presents to the ER for evaluation of worsening exertional shortness of breath. ?Patient at baseline walks 3 miles a day and was able to do that up until 2 days prior to her presentation to the ER.  She used to be able to walk 3 miles an hour but now does that in about 75 minutes. ?On the day of admission she states that she had difficulty ambulating between rooms in her house and this was concerning to her so she came to the ER.  She has a cough that is nonproductive and denies having any fever or chills.  She has no lower extremity swelling, denies having any orthopnea, no paroxysmal nocturnal dyspnea, no palpitations, no abdominal pain, no nausea, no vomiting, no dizziness or lightheadedness. ?She was ambulated in the ER and post ambulation her pulse oximetry was 87%. ?She will be referred to observation status for further evaluation. ?Review of Systems: As mentioned in the history of present illness. All other systems reviewed and are negative. ?Past Medical History:  ?Diagnosis Date  ? Actinic keratosis   ? Anemia   ? COVID-19   ? 05/30/20 had covid 19 pill   ? Glaucoma   ? History of blood transfusion   ? History of hiatal hernia   ? Hyperlipidemia   ? Incidental pulmonary nodule, > 30m and < 828m3/12/2018  ? Noted on CT scan - needs f/u imaging 6 months  ? 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 Squamous cell carcinoma of skin 11/12/2019  ? L distal dorsum forearm - ED&C   ? [facility]  ? ?Past Surgical History:   ?Procedure Laterality Date  ? ABDOMINAL HYSTERECTOMY  1982  ? total  ? BUNIONECTOMY Bilateral   ? CLIPPING OF ATRIAL APPENDAGE  03/21/2018  ? Procedure: Clipping Of Atrial Appendage - using an AtriCure 4578mlip;  Surgeon: OweRexene AlbertsD;  [facility] [place];;  ? EXCISION OF ATRIAL MYXOMA N/A 03/21/2018  ? Procedure: Excision Of Atrial Myxoma;  Surgeon: OweRexene AlbertsD;  [facility] [place] [facility];  Laterality: N/A;  ? RECTOCELE REPAIR    ? RIGHT/LEFT HEART CATH AND CORONARY ANGIOGRAPHY N/A 03/19/2018  ? Procedure: RIGHT/LEFT HEART CATH AND CORONARY ANGIOGRAPHY;  Surgeon: AriWellington HampshireD;  [facility] [place] [facility];  Service: Cardiovascular;  Laterality: N/A;  ? TEE WITHOUT CARDIOVERSION N/A 03/21/2018  ? Procedure: TRANSESOPHAGEAL ECHOCARDIOGRAM (TEE);  Surgeon: OweRexene AlbertsD;  [facility] [place];  Laterality: N/A;  ? TONSILLECTOMY    ? ?Social History:  reports that she has never smoked. She has never used smokeless tobacco. She reports that she does not drink alcohol and does not use drugs. ? ?Allergies  ?Allergen Reactions  ? Bee Venom Anaphylaxis and Hives  ? Codeine Other (See Comments)  ?  Altered mental status ?Altered mental status  ? Morphine And Related   ?  Altered mental status  ? Shellfish Allergy Anaphylaxis and Hives  ?  Mainly shrimp  ? Bactrim [Sulfamethoxazole-Trimethoprim]   ?  ? Reaction  ?  ? Norvasc [Amlodipine]   ?  2.5 leg edema ?  ? Nitrofurantoin Rash  ? ? ?Family History  ?Problem Relation Age of Onset  ? Colon cancer Brother   ? Cancer Brother   ?     neck tumor cancerous   ? Esophageal varices Brother   ? Colon cancer Maternal Uncle   ? Colon cancer Maternal Grandmother   ? Kidney disease Brother   ? Coronary artery disease Brother   ?     with stent, cabg  ? Breast cancer Neg Hx   ? ? ?Prior to Admission medications   ?Medication Sig Start Date End Date Taking? Authorizing Provider   ?Ascorbic Acid (VITAMIN C PO) Take by mouth daily at 12 noon.    [provider]  ?aspirin EC 81 MG tablet Take 1 tablet (81 mg total) by mouth daily. 05/23/20   McLean-Scocuzza, Nino Glow, MD  ?atorvastatin (LIPITOR) 20 MG tablet Take 1 tablet (20 mg total) by mouth daily at 6 PM. 05/13/20   McLean-Scocuzza, Nino Glow, MD  ?Calcium Carbonate-Vitamin D 500-125 MG-UNIT TABS Take 1 tablet by mouth daily.    [provider]  ?cholecalciferol (VITAMIN D) 1000 units tablet Take 1,000 Units by mouth daily.    [provider]  ?ferrous sulfate (FEROSUL) 325 (65 FE) MG tablet Take 1 tablet (325 mg total) by mouth daily with breakfast. With vitamin C 500 mg daily 05/23/20   McLean-Scocuzza, Nino Glow, MD  ?Flaxseed, Linseed, (FLAX SEED OIL) 1300 MG CAPS Take 1,100 mg by mouth daily.     [provider]  ?losartan (COZAAR) 25 MG tablet Take 0.5 tablets (12.5 mg total) by mouth daily. Dc norvasc 2.5 mg qd 07/20/20   McLean-Scocuzza, Nino Glow, MD  ?Multiple Vitamin (MULTIVITAMIN) tablet Take 1 tablet by mouth daily.    [provider]  ?Omega-3 Fatty Acids (FISH OIL) 1200 MG CAPS Take by mouth daily at 8 pm.    [provider]  ?omeprazole (PRILOSEC) 20 MG capsule Take 1 capsule (20 mg total) by mouth daily. 30 minutes before breakfast prn 05/13/20   McLean-Scocuzza, Nino Glow, MD  ? ? ?Physical Exam: ?Vitals:  ? 05/08/21 0803 05/08/21 0900 05/08/21 1000 05/08/21 1100  ?BP: (!) 168/68 (!) 189/74 (!) 196/79 (!) 186/67  ?Pulse: (!) 57 (!) 52 66 (!) 56  ?Resp: 18 (!) 24 17 (!) 25  ?Temp:      ?TempSrc:      ?SpO2: 90% 96% 94% 95%  ?Weight:      ?Height:      ? ?Physical Exam ?Vitals and nursing note reviewed.  ?Constitutional:   ?   Appearance: She is well-developed.  ?   Comments: Looks younger than stated age  ?HENT:  ?   Head: Normocephalic and atraumatic.  ?Eyes:  ?   Pupils: Pupils are equal, round, and reactive to light.  ?Cardiovascular:  ?   Rate and Rhythm: Normal rate and regular  rhythm.  ?Pulmonary:  ?   Effort: Pulmonary effort is normal.  ?   Breath sounds: Examination of the right-lower field reveals rales. Examination of the left-lower field reveals rales. Rales present.  ?Abdominal:  ?   General: Bowel sounds are normal.  ?   Palpations: Abdomen is soft.  ?Musculoskeletal:     ?   General: Normal range of motion.  ?   Cervical back: Normal range of  motion and neck supple.  ?Skin: ?   General: Skin is warm and dry.  ?Neurological:  ?   General: No focal deficit present.  ?   Mental Status: She is alert and oriented to person, place, and time.  ?Psychiatric:     ?   Mood and Affect: Mood normal.  ? ? ?Data Reviewed: ?Relevant notes from primary care and specialist visits, past discharge summaries as available in EHR, including Care Everywhere. ?Prior diagnostic testing as pertinent to current admission diagnoses ?Updated medications and problem lists for reconciliation ?ED course, including vitals, labs, imaging, treatment and response to treatment ?Triage notes, nursing and pharmacy notes and ED provider's notes ?Notable results as noted in HPI ?Labs reviewed.  Troponin 5, BNP 69, D-dimer 0.63, sodium 137, potassium 3.9, chloride 104, bicarb 26, glucose 91, BUN 17, creatinine 0.86, calcium 9.2, total protein 7.9, albumin 3.9, AST 29, ALT 25, alk phos 97, white count 6.7, hemoglobin 15.9, hematocrit 48.1, MCV 93.8, platelet count 191 ?Chest x-ray reviewed by me shows no acute finding when compared to prior. Chronic interstitial lung disease. Large hiatal hernia. ?CT angiogram of the chest shows no CT findings for pulmonary embolism. Normal caliber thoracic aorta.  No dissection or aneurysm. Very large hiatal hernia with most of the stomach up in the ?chest. Organoaxial volvulus noted. Chronic changes of pulmonary fibrosis. No definite acute ?overlying pulmonary process. Aortic Atherosclerosis and Emphysema . ?Twelve-lead EKG reviewed by me shows sinus tachycardia with left posterior  fascicular block ?There are no new results to review at this time. ? ?Assessment and Plan: ?* Pulmonary fibrosis ([place]) ?Patient with a history of pulmonary fibrosis who presents to the ER for evaluation of worsening exerti

## 2021-05-08 NOTE — Consult Note (Signed)
? ? ? ?PULMONOLOGY ? ? ? ? ? ? ? ? ?Date: 05/08/2021,   ?MRN# [id number] Wanda Nelson 08/18/1931 ? ? ?  ?AdmissionWeight: 50.8 kg                 ?CurrentWeight: 50.8 kg ? ?Referring provider: Dr Francine Graven ? ? ?CHIEF COMPLAINT:  ? ?Acute hypoxemic respiratory failure ? ? ?HISTORY OF PRESENT ILLNESS  ? ?This is a 88 yo F with hx of pulmonary fibrosis, chronic asthma, anemia, COVID19, history of hiatal hernia and hx of CVA. She came in with dyspnea which has been progressively worse over few months and finally had to come in due to [place]. She has GERD unable to tolerate soda beverages.  She had accelerated HTN on arrival states this is very unusual. She walks 3 miles daily but has not been able to do that in last few days. She denies sick contacts, lives at [facility].  ? ? ?PAST MEDICAL HISTORY  ? ?Past Medical History:  ?Diagnosis Date  ? Actinic keratosis   ? Anemia   ? COVID-19   ? 05/30/20 had covid 19 pill   ? Glaucoma   ? History of blood transfusion   ? History of hiatal hernia   ? Hyperlipidemia   ? Incidental pulmonary nodule, > 26m and < 862m3/12/2018  ? Noted on CT scan - needs f/u imaging 6 months  ? 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 Squamous cell carcinoma of skin 11/12/2019  ? L distal dorsum forearm - ED[facility]C   [facility]  ? ? ? ?SURGICAL HISTORY  ? ?Past Surgical History:  ?Procedure Laterality Date  ? ABDOMINAL HYSTERECTOMY  1982  ? total  ? BUNIONECTOMY Bilateral   ? CLIPPING OF ATRIAL APPENDAGE  03/21/2018  ? Procedure: Clipping Of Atrial Appendage - using an AtriCure 4545mlip;  Surgeon: OweRexene AlbertsD;  [facility] [place];;  ? EXCISION OF ATRIAL MYXOMA N/A 03/21/2018  ? Procedure: Excision Of Atrial Myxoma;  Surgeon: OweRexene AlbertsD;  [facility] [place];  Laterality: N/A;  ? RECTOCELE REPAIR    ? RIGHT/LEFT HEART CATH AND CORONARY ANGIOGRAPHY N/A 03/19/2018  ? Procedure: RIGHT/LEFT HEART CATH AND CORONARY ANGIOGRAPHY;   Surgeon: AriWellington HampshireD;  [facility] [place] [facility];  Service: Cardiovascular;  Laterality: N/A;  ? TEE WITHOUT CARDIOVERSION N/A 03/21/2018  ? Procedure: TRANSESOPHAGEAL ECHOCARDIOGRAM (TEE);  Surgeon: OweRexene AlbertsD;  [facility] [place];  Laterality: N/A;  ? TONSILLECTOMY    ? ? ? ?FAMILY HISTORY  ? ?Family History  ?Problem Relation Age of Onset  ? Colon cancer Brother   ? Cancer Brother   ?     neck tumor cancerous   ? Esophageal varices Brother   ? Colon cancer Maternal Uncle   ? Colon cancer Maternal Grandmother   ? Kidney disease Brother   ? Coronary artery disease Brother   ?     with stent, cabg  ? Breast cancer Neg Hx   ? ? ? ?SOCIAL HISTORY  ? ?Social History  ? ?Tobacco Use  ? Smoking status: Never  ? Smokeless tobacco: Never  ?Vaping Use  ? Vaping Use: Never used  ?Substance Use Topics  ? Alcohol use: No  ?  Alcohol/week: 0.0 standard drinks  ? Drug use: No  ? ? ? ?MEDICATIONS  ? ? ?Home Medication:  ?  Current Outpatient Rx  ? Order #: [id number] Class: Historical Med  ? Order #: [id number] Class: Normal  ? Order #: [id number] Class: Normal  ? Order #: [id number] Class: Historical Med  ? Order #: [id number] Class: Historical Med  ? Order #: [id number] Class: Normal  ? Order #: [id number] Class: Historical Med  ? Order #: [id number] Class: Normal  ? Order #: [id number] Class: Historical Med  ? Order #: [id number] Class: Historical Med  ? Order #: [id number] Class: Normal  ?  ?Current Medication: ? ?Current Facility-Administered Medications:  ?  0.9 %  sodium chloride infusion, 250 mL, Intravenous, PRN, Agbata, Tochukwu, MD ?  acetaminophen (TYLENOL) tablet 650 mg, 650 mg, Oral, Q6H PRN **OR** acetaminophen (TYLENOL) suppository 650 mg, 650 mg, Rectal, Q6H PRN, Agbata, Tochukwu, MD ?  aspirin EC tablet 81 mg, 81 mg, Oral, Daily, Agbata, Tochukwu, MD ?  atorvastatin (LIPITOR) tablet 20 mg, 20 mg, Oral, q1800, Agbata, Tochukwu, MD ?  Calcium Carbonate-Vitamin D 500-125 MG-UNIT TABS 1  tablet, 1 tablet, Oral, Daily, Agbata, Tochukwu, MD ?  [START ON 05/09/2021] cholecalciferol (VITAMIN D3) tablet 1,000 Units, 1,000 Units, Oral, Daily, Agbata, Tochukwu, MD ?  enoxaparin (LOVENOX) injection 40 mg, 40 mg, Subcutaneous, Q24H, Agbata, Tochukwu, MD ?  [START ON 05/09/2021] ferrous sulfate tablet 325 mg, 325 mg, Oral, Q breakfast, Agbata, Tochukwu, MD ?  ipratropium-albuterol (DUONEB) 0.5-2.5 (3) MG/3ML nebulizer solution 3 mL, 3 mL, Nebulization, Q6H PRN, Agbata, Tochukwu, MD ?  losartan (COZAAR) tablet 12.5 mg, 12.5 mg, Oral, Daily, Agbata, Tochukwu, MD ?  methylPREDNISolone sodium succinate (SOLU-MEDROL) 40 mg/mL injection 40 mg, 40 mg, Intravenous, Q12H **FOLLOWED BY** [START ON 05/09/2021] predniSONE (DELTASONE) tablet 40 mg, 40 mg, Oral, Q breakfast, Agbata, Tochukwu, MD ?  multivitamin tablet 1 tablet, 1 tablet, Oral, Daily, Agbata, Tochukwu, MD ?  omega-3 acid ethyl esters (LOVAZA) capsule 1 g, 1 g, Oral, Daily, Agbata, Tochukwu, MD ?  ondansetron (ZOFRAN) tablet 4 mg, 4 mg, Oral, Q6H PRN **OR** ondansetron (ZOFRAN) injection 4 mg, 4 mg, Intravenous, Q6H PRN, Agbata, Tochukwu, MD ?  pantoprazole (PROTONIX) EC tablet 40 mg, 40 mg, Oral, Daily, Agbata, Tochukwu, MD ?  sodium chloride flush (NS) 0.9 % injection 3 mL, 3 mL, Intravenous, Q12H, Agbata, Tochukwu, MD ?  sodium chloride flush (NS) 0.9 % injection 3 mL, 3 mL, Intravenous, PRN, Agbata, Tochukwu, MD ? ?Current Outpatient Medications:  ?  Ascorbic Acid (VITAMIN C PO), Take by mouth daily at 12 noon., Disp: , Rfl:  ?  aspirin EC 81 MG tablet, Take 1 tablet (81 mg total) by mouth daily., Disp: 90 tablet, Rfl: 3 ?  atorvastatin (LIPITOR) 20 MG tablet, Take 1 tablet (20 mg total) by mouth daily at 6 PM., Disp: 90 tablet, Rfl: 3 ?  Calcium Carbonate-Vitamin D 500-125 MG-UNIT TABS, Take 1 tablet by mouth daily., Disp: , Rfl:  ?  cholecalciferol (VITAMIN D) 1000 units tablet, Take 1,000 Units by mouth daily., Disp: , Rfl:  ?  ferrous sulfate (FEROSUL) 325  (65 FE) MG tablet, Take 1 tablet (325 mg total) by mouth daily with breakfast. With vitamin C 500 mg daily, Disp: 90 tablet, Rfl: 3 ?  Flaxseed, Linseed, (FLAX SEED OIL) 1300 MG CAPS, Take 1,100 mg by mouth daily. , Disp: , Rfl:  ?  losartan (COZAAR) 25 MG tablet, Take 0.5 tablets (12.5 mg total) by mouth daily. Dc norvasc 2.5 mg qd, Disp: 45 tablet, Rfl: 3 ?  Multiple Vitamin (MULTIVITAMIN) tablet, Take 1 tablet by mouth daily., Disp: , Rfl:  ?  Omega-3 Fatty Acids (  FISH OIL) 1200 MG CAPS, Take by mouth daily at 8 pm., Disp: , Rfl:  ?  omeprazole (PRILOSEC) 20 MG capsule, Take 1 capsule (20 mg total) by mouth daily. 30 minutes before breakfast prn, Disp: 90 capsule, Rfl: 3 ? ? ? ?ALLERGIES  ? ?Bee venom, Codeine, Morphine and related, Shellfish allergy, Bactrim [sulfamethoxazole-trimethoprim], Norvasc [amlodipine], and Nitrofurantoin ? ? ? ? ?REVIEW OF SYSTEMS  ? ? ?Review of Systems: ? ?Gen:  Denies  fever, sweats, chills weigh loss  ?HEENT: Denies blurred vision, double vision, ear pain, eye pain, hearing loss, nose bleeds, sore throat ?Cardiac:  No dizziness, chest pain or heaviness, chest tightness,edema ?Resp:   reports dyspnea chronically  ?Gi: Denies swallowing difficulty, stomach pain, nausea or vomiting, diarrhea, constipation, bowel incontinence ?Gu:  Denies bladder incontinence, burning urine ?Ext:   Denies Joint pain, stiffness or swelling ?Skin: Denies  skin rash, easy bruising or bleeding or hives ?Endoc:  Denies polyuria, polydipsia , polyphagia or weight change ?Psych:   Denies depression, insomnia or hallucinations  ? ?Other:  All other systems negative ? ? ?VS: BP (!) 186/67   Pulse (!) 56   Temp 97.8 ?F (36.6 ?C) (Oral)   Resp (!) 25   Ht '5\' 2"'$  (1.575 m)   Wt 50.8 kg   LMP  (LMP Unknown)   SpO2 95%   BMI 20.49 kg/m?   ? ? ? ?PHYSICAL EXAM  ? ? ?GENERAL:NAD, no fevers, chills, no weakness no fatigue ?HEAD: Normocephalic, atraumatic.  ?EYES: Pupils equal, round, reactive to light.  Extraocular muscles intact. No scleral icterus.  ?MOUTH: Moist mucosal membrane. Dentition intact. No abscess noted.  ?EAR, NOSE, THROAT: Clear without exudates. No external lesions.  ?NECK: Supple. No thyromegaly. No nod

## 2021-05-08 NOTE — Consult Note (Signed)
?Cardiology Consultation:  ? ?Patient ID: Wanda Nelson ?MRN: [id number]; DOB: 1931-05-06 ? ?Admit date: 05/08/2021 ?Date of Consult: 05/08/2021 ? ?PCP:  McLean-Scocuzza, Nino Glow, MD ?  ?[facility] Providers ?Cardiologist:  [name], MD      ? ? ?Patient Profile:  ? ?Wanda Nelson is a 88 y.o. female with a hx of bronchitis, chronic anemia, and removal of atrial myxoma who is being seen 05/08/2021 for the evaluation of shortness of breath at the request of Dr. Cinda Quest. ? ?History of Present Illness:  ? ?Ms. [name] has been having worsening shortness of breath that started approximately two weeks ago. She had noticed that on her morning walks of three miles that when she returned home she would have a recovery time of about 10 minutes prior to being able to complete to housework. Over the last week she has continued her walks but at a much slower pace. She has also decreased the amount of activity she has been doing at home and has been reading primarily which keeps her sitting. She decided to come to the emergency department today after increasing difficulty walking the halls at home on yesterday. She stated this morning that walking from her living room to her bathroom was making her more winded. Of note she has traveled to [place] and [place] in February. She denies any chest pain, chest pressure, peripheral edema, abdominal swelling, but does endorse palpitations.  ? ? ?Past Medical History:  ?Diagnosis Date  ? Actinic keratosis   ? Anemia   ? COVID-19   ? 05/30/20 had covid 19 pill   ? Glaucoma   ? History of blood transfusion   ? History of hiatal hernia   ? Hyperlipidemia   ? Incidental pulmonary nodule, > 58m and < 838m3/12/2018  ? Noted on CT scan - needs f/u imaging 6 months  ? 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 Squamous cell carcinoma of skin 11/12/2019  ? L distal dorsum forearm - ED&C   ? [facility]  ? ? ?Past Surgical History:  ?Procedure Laterality Date  ? ABDOMINAL  HYSTERECTOMY  1982  ? total  ? BUNIONECTOMY Bilateral   ? CLIPPING OF ATRIAL APPENDAGE  03/21/2018  ? Procedure: Clipping Of Atrial Appendage - using an AtriCure 4545mlip;  Surgeon: OweRexene AlbertsD;  [facility];;  ? EXCISION OF ATRIAL MYXOMA N/A 03/21/2018  ? Procedure: Excision Of Atrial Myxoma;  Surgeon: OweRexene AlbertsD;  [facility] [place];  Laterality: N/A;  ? RECTOCELE REPAIR    ? RIGHT/LEFT HEART CATH AND CORONARY ANGIOGRAPHY N/A 03/19/2018  ? Procedure: RIGHT/LEFT HEART CATH AND CORONARY ANGIOGRAPHY;  Surgeon: AriWellington HampshireD;  Location[facility] [place] [facility];  Service: Cardiovascular;  Laterality: N/A;  ? TEE WITHOUT CARDIOVERSION N/A 03/21/2018  ? Procedure: TRANSESOPHAGEAL ECHOCARDIOGRAM (TEE);  Surgeon: OweRexene AlbertsD;  [facility] [place];  Laterality: N/A;  ? TONSILLECTOMY    ?  ? ? ? ?Inpatient Medications: ?Scheduled Meds: ? ?Continuous Infusions: ? ?PRN Meds: ? ? ?Allergies:    ?Allergies  ?Allergen Reactions  ? Bee Venom Anaphylaxis and Hives  ? Codeine Other (See Comments)  ?  Altered mental status ?Altered mental status  ? Morphine And Related   ?  Altered mental status  ? Shellfish Allergy Anaphylaxis and Hives  ?  Mainly shrimp  ? Bactrim [Sulfamethoxazole-Trimethoprim]   ?  ?  Reaction  ?  ? Norvasc [Amlodipine]   ?  2.5 leg edema ?  ? Nitrofurantoin Rash  ? ? ?Social History:   ?Social History  ? ?Socioeconomic History  ? Marital status: Widowed  ?  Spouse name: Not on file  ? Number of children: Not on file  ? Years of education: Not on file  ? Highest education level: Not on file  ?Occupational History  ? Not on file  ?Tobacco Use  ? Smoking status: Never  ? Smokeless tobacco: Never  ?Vaping Use  ? Vaping Use: Never used  ?Substance and Sexual Activity  ? Alcohol use: No  ?  Alcohol/week: 0.0 standard drinks  ? Drug use: No  ? Sexual activity: Not Currently  ?Other Topics Concern  ? Not on  file  ?Social History Narrative  ? [place],  husband died 12/18/18 due to brain hemorrhage  ? Daughter in law Sanaa Zilberman   ? widowed  ? ?Social Determinants of Health  ? ?Financial Resource Strain: Not on file  ?Food Insecurity: Not on file  ?Transportation Needs: Not on file  ?Physical Activity: Not on file  ?Stress: Not on file  ?Social Connections: Not on file  ?Intimate Partner Violence: Not on file  ?  ?Family History:   ? ?Family History  ?Problem Relation Age of Onset  ? Colon cancer Brother   ? Cancer Brother   ?     neck tumor cancerous   ? Esophageal varices Brother   ? Colon cancer Maternal Uncle   ? Colon cancer Maternal Grandmother   ? Kidney disease Brother   ? Coronary artery disease Brother   ?     with stent, cabg  ? Breast cancer Neg Hx   ?  ? ?ROS:  ?Please see the history of present illness.  ?Constitutional: Endorses fatigue ?HEENT: Endorses allergic rhinitis, watery eyes ?Cardiovascular: Endorses palpitations ?Pulmonary: Endorses worsening shortness of breath ? ?All other ROS reviewed and negative.    ? ?Physical Exam/Data:  ? ?Vitals:  ? 05/08/21 0803 05/08/21 0900 05/08/21 1000 05/08/21 1100  ?BP: (!) 168/68 (!) 189/74 (!) 196/79 (!) 186/67  ?Pulse: (!) 57 (!) 52 66 (!) 56  ?Resp: 18 (!) 24 17 (!) 25  ?Temp:      ?TempSrc:      ?SpO2: 90% 96% 94% 95%  ?Weight:      ?Height:      ? ?No intake or output data in the 24 hours ending 05/08/21 1208 ? ?  05/08/2021  ?  6:48 AM 04/17/2021  ?  9:47 AM 02/02/2021  ?  2:36 PM  ?Last 3 Weights  ?Weight (lbs) 112 lb 115 lb 120 lb 8 oz  ?Weight (kg) 50.803 kg 52.164 kg 54.658 kg  ?   ?Body mass index is 20.49 kg/m?.  ?General:  Well nourished, well developed, in no acute distress, sitting upright on the stretcher ?HEENT: normal ?Neck: no JVD ?Vascular: No carotid bruits; Distal pulses 2+ bilaterally ?Cardiac:  normal S1, S2; RRR; no murmur, rubs, or gallops ?Lungs:  clear to auscultation bilaterally in upper lobes, crackles noted in the bilateral  bases without wheezing, respirations are unlabored on room air at rest ?Abd: soft, nontender, no hepatomegaly  ?Ext: no edema ?Musculoskeletal:  No deformities, BUE and BLE strength normal and equal ?Skin: warm and dry  ?Neuro:  CNs 2-12 intact, no focal abnormalities noted ?Psych:  Normal affect  ? ?EKG:  The EKG was personally reviewed and demonstrates:  sinus bradycardia rate of 56 ?Telemetry:  Telemetry was personally reviewed and demonstrates:  sinus bradycardia with artifact ? ?Relevant CV Studies: ?Echocardiogram from 07/22/2021 ?1. Left ventricular ejection fraction, by estimation, is 60 to 65%. The  ?left ventricle has normal function. The left ventricle has no regional  ?wall motion abnormalities. Left ventricular diastolic parameters are  ?consistent with Grade I diastolic  ?dysfunction (impaired relaxation).  ? 2. Right ventricular systolic function is normal. The right ventricular  ?size is normal. There is mildly elevated pulmonary artery systolic  ?pressure. The estimated right ventricular systolic pressure is 67.6 mmHg.  ? 3. The mitral valve is normal in structure. Mild mitral valve  ?regurgitation.  ? 4. Tricuspid valve regurgitation is moderate.  ? 5. The inferior vena cava is normal in size with greater than 50%  ?respiratory variability, suggesting right atrial pressure of 3 mmHg.  ? ?Laboratory Data: ? ?High Sensitivity Troponin:   ?Recent Labs  ?Lab 05/08/21 ?0654 05/08/21 ?0851  ?TROPONINIHS 6 5  ?   ?Chemistry ?Recent Labs  ?Lab 05/08/21 ?0654  ?NA 137  ?K 3.9  ?CL 104  ?CO2 26  ?GLUCOSE 91  ?BUN 17  ?CREATININE 0.86  ?CALCIUM 9.2  ?GFRNONAA >60  ?ANIONGAP 7  ?  ?Recent Labs  ?Lab 05/08/21 ?0654  ?PROT 7.9  ?ALBUMIN 3.9  ?AST 29  ?ALT 25  ?ALKPHOS 97  ?BILITOT 0.8  ? ?Lipids No results for input(s): CHOL, TRIG, HDL, LABVLDL, LDLCALC, CHOLHDL in the last 168 hours.  ?Hematology ?Recent Labs  ?Lab 05/08/21 ?0654  ?WBC 6.7  ?RBC 5.13*  ?HGB 15.9*  ?HCT 48.1*  ?MCV 93.8  ?MCH 31.0  ?MCHC 33.1   ?RDW 12.6  ?PLT 191  ? ?Thyroid No results for input(s): TSH, FREET4 in the last 168 hours.  ?BNP ?Recent Labs  ?Lab 05/08/21 ?0654  ?BNP 69.7  ?  ?DDimer  ?Recent Labs  ?Lab 05/08/21 ?0654  ?DDIMER 0.63*

## 2021-05-08 NOTE — Assessment & Plan Note (Signed)
Stable.  Continue PPI. 

## 2021-05-08 NOTE — ED Notes (Signed)
Patient ambulatory in room with RN. Patient's O2 dropped to 87% on RA while ambulating. Patient O2 back to 92% on room air while sitting in stretcher. Cinda Quest, MD aware. ?

## 2021-05-08 NOTE — Assessment & Plan Note (Signed)
Continue Cozaar 

## 2021-05-08 NOTE — ED Notes (Signed)
Echo at bedside

## 2021-05-08 NOTE — Assessment & Plan Note (Signed)
Patient with a history of pulmonary fibrosis who presents to the ER for evaluation of worsening exertional dyspnea and found to have a post ambulatory pulse ox of 87%. ?We will place patient on Solu-Medrol 40 mg IV every 12 ?Place patient on as needed bronchodilator therapy ?Add inhaled steroids ?We will request pulmonology consult ?

## 2021-05-08 NOTE — ED Triage Notes (Signed)
Pt presents via POV with SOB for the last 2-3 days worse with ambulation. Pt has hx of left atrial myxoma resection. Denies CP.  ?

## 2021-05-09 DIAGNOSIS — J8410 Pulmonary fibrosis, unspecified: Secondary | ICD-10-CM | POA: Diagnosis not present

## 2021-05-09 DIAGNOSIS — I10 Essential (primary) hypertension: Secondary | ICD-10-CM | POA: Diagnosis not present

## 2021-05-09 DIAGNOSIS — R0600 Dyspnea, unspecified: Secondary | ICD-10-CM

## 2021-05-09 DIAGNOSIS — R0902 Hypoxemia: Secondary | ICD-10-CM | POA: Diagnosis not present

## 2021-05-09 DIAGNOSIS — Z8719 Personal history of other diseases of the digestive system: Secondary | ICD-10-CM | POA: Diagnosis not present

## 2021-05-09 DIAGNOSIS — J9601 Acute respiratory failure with hypoxia: Secondary | ICD-10-CM | POA: Diagnosis not present

## 2021-05-09 LAB — BASIC METABOLIC PANEL
Anion gap: 10 (ref 5–15)
BUN: 25 mg/dL — ABNORMAL HIGH (ref 8–23)
CO2: 21 mmol/L — ABNORMAL LOW (ref 22–32)
Calcium: 9.7 mg/dL (ref 8.9–10.3)
Chloride: 105 mmol/L (ref 98–111)
Creatinine, Ser: 0.92 mg/dL (ref 0.44–1.00)
GFR, Estimated: 60 mL/min — ABNORMAL LOW (ref >60)
Glucose, Bld: 121 mg/dL — ABNORMAL HIGH (ref 70–99)
Potassium: 4.1 mmol/L (ref 3.5–5.1)
Sodium: 136 mmol/L (ref 135–145)

## 2021-05-09 LAB — CBC
HCT: 48.2 % — ABNORMAL HIGH (ref 36.0–46.0)
Hemoglobin: 16.2 g/dL — ABNORMAL HIGH (ref 12.0–15.0)
MCH: 31.2 pg (ref 26.0–34.0)
MCHC: 33.6 g/dL (ref 30.0–36.0)
MCV: 92.9 fL (ref 80.0–100.0)
Platelets: 222 10*3/uL (ref 150–400)
RBC: 5.19 MIL/uL — ABNORMAL HIGH (ref 3.87–5.11)
RDW: 12.7 % (ref 11.5–15.5)
WBC: 8.9 10*3/uL (ref 4.0–10.5)
nRBC: 0 % (ref 0.0–0.2)

## 2021-05-09 MED ORDER — AZITHROMYCIN 500 MG PO TABS
500.0000 mg | ORAL_TABLET | Freq: Every day | ORAL | 0 refills | Status: AC
Start: 2021-05-10 — End: 2021-05-13

## 2021-05-09 MED ORDER — IPRATROPIUM-ALBUTEROL 0.5-2.5 (3) MG/3ML IN SOLN: 3.0000 mL | Freq: Four times a day (QID) | RESPIRATORY_TRACT | 0 refills | Status: DC | PRN

## 2021-05-09 MED ORDER — PREDNISONE 20 MG PO TABS: 40.0000 mg | ORAL_TABLET | Freq: Every day | ORAL | 0 refills | Status: AC

## 2021-05-09 MED ORDER — ACETAMINOPHEN 325 MG PO TABS: 650.0000 mg | ORAL_TABLET | Freq: Four times a day (QID) | ORAL | Status: AC | PRN

## 2021-05-09 MED ORDER — LORATADINE 10 MG PO TABS: 10.0000 mg | ORAL_TABLET | Freq: Every day | ORAL | 0 refills | Status: DC

## 2021-05-09 MED ORDER — IPRATROPIUM-ALBUTEROL 0.5-2.5 (3) MG/3ML IN SOLN
3.0000 mL | RESPIRATORY_TRACT | Status: DC
  Administered 2021-05-09: 3 mL via RESPIRATORY_TRACT
  Filled 2021-05-09 (×2): qty 3

## 2021-05-09 MED ORDER — MOMETASONE FURO-FORMOTEROL FUM 100-5 MCG/ACT IN AERO: 2.0000 | INHALATION_SPRAY | Freq: Two times a day (BID) | RESPIRATORY_TRACT | 0 refills | Status: DC

## 2021-05-09 MED ORDER — LORATADINE 10 MG PO TABS
10.0000 mg | ORAL_TABLET | Freq: Every day | ORAL | Status: DC
  Administered 2021-05-09: 10 mg via ORAL
  Filled 2021-05-09: qty 1

## 2021-05-09 NOTE — Progress Notes (Signed)
SATURATION QUALIFICATIONS: (This note is used to comply with regulatory documentation for home oxygen)  Patient Saturations on Room Air at Rest = 97%  Patient Saturations on Room Air while Ambulating = 92%   

## 2021-05-09 NOTE — Consult Note (Signed)
? ? ? ?PULMONOLOGY ? ? ? ? ? ? ? ? ?Date: 05/09/2021,   ?MRN# [id number] Wanda Nelson April 28, 1931 ? ? ?  ?AdmissionWeight: 50.8 kg                 ?CurrentWeight: 50.8 kg ? ?Referring provider: Dr Francine Graven ? ? ?CHIEF COMPLAINT:  ? ?Acute hypoxemic respiratory failure ? ? ?HISTORY OF PRESENT ILLNESS  ? ?This is a 88 yo F with hx of pulmonary fibrosis, chronic asthma, anemia, COVID19, history of hiatal hernia and hx of CVA. She came in with dyspnea which has been progressively worse over few months and finally had to come in due to [place]. She has GERD unable to tolerate soda beverages.  She had accelerated HTN on arrival states this is very unusual. She walks 3 miles daily but has not been able to do that in last few days. She denies sick contacts, lives at [facility].  ? ?Patient is stable for dc home.  She is on room air. She is with chronic cough.  She should have additional evaluation with GI for hiatal hernia.  ? ? ?PAST MEDICAL HISTORY  ? ?Past Medical History:  ?Diagnosis Date  ? Actinic keratosis   ? Anemia   ? COVID-19   ? 05/30/20 had covid 19 pill   ? Glaucoma   ? History of blood transfusion   ? History of hiatal hernia   ? Hyperlipidemia   ? Incidental pulmonary nodule, > 36m and < 851m3/12/2018  ? Noted on CT scan - needs f/u imaging 6 months  ? 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 Squamous cell carcinoma of skin 11/12/2019  ? L distal dorsum forearm - ED&C   ? [facility]  ? ? ? ?SURGICAL HISTORY  ? ?Past Surgical History:  ?Procedure Laterality Date  ? ABDOMINAL HYSTERECTOMY  1982  ? total  ? BUNIONECTOMY Bilateral   ? CLIPPING OF ATRIAL APPENDAGE  03/21/2018  ? Procedure: Clipping Of Atrial Appendage - using an AtriCure 4540mlip;  Surgeon: OweRexene AlbertsD;  [facility] [place];;  ? EXCISION OF ATRIAL MYXOMA N/A 03/21/2018  ? Procedure: Excision Of Atrial Myxoma;  Surgeon: OweRexene AlbertsD;  [facility] [place];  Laterality: N/A;   ? RECTOCELE REPAIR    ? RIGHT/LEFT HEART CATH AND CORONARY ANGIOGRAPHY N/A 03/19/2018  ? Procedure: RIGHT/LEFT HEART CATH AND CORONARY ANGIOGRAPHY;  Surgeon: AriWellington HampshireD;  [facility] [place] [facility];  Service: Cardiovascular;  Laterality: N/A;  ? TEE WITHOUT CARDIOVERSION N/A 03/21/2018  ? Procedure: TRANSESOPHAGEAL ECHOCARDIOGRAM (TEE);  Surgeon: OweRexene AlbertsD;  [facility] [place];  Laterality: N/A;  ? TONSILLECTOMY    ? ? ? ?FAMILY HISTORY  ? ?Family History  ?Problem Relation Age of Onset  ? Colon cancer Brother   ? Cancer Brother   ?     neck tumor cancerous   ? Esophageal varices Brother   ? Colon cancer Maternal Uncle   ? Colon cancer Maternal Grandmother   ? Kidney disease Brother   ? Coronary artery disease Brother   ?     with stent, cabg  ? Breast cancer Neg Hx   ? ? ? ?SOCIAL HISTORY  ? ?Social History  ? ?Tobacco Use  ? Smoking status: Never  ? Smokeless tobacco: Never  ?Vaping Use  ? Vaping Use: Never used  ?Substance  Use Topics  ? Alcohol use: No  ?  Alcohol/week: 0.0 standard drinks  ? Drug use: No  ? ? ? ?MEDICATIONS  ? ? ?Home Medication:  ? ?  ?Current Medication: ? ?Current Facility-Administered Medications:  ?  0.9 %  sodium chloride infusion, 250 mL, Intravenous, PRN, Agbata, Tochukwu, MD ?  acetaminophen (TYLENOL) tablet 650 mg, 650 mg, Oral, Q6H PRN, 650 mg at 05/09/21 0832 **OR** acetaminophen (TYLENOL) suppository 650 mg, 650 mg, Rectal, Q6H PRN, Agbata, Tochukwu, MD ?  ascorbic acid (VITAMIN C) tablet 500 mg, 500 mg, Oral, Daily, Agbata, Tochukwu, MD, 500 mg at 05/09/21 4174 ?  aspirin EC tablet 81 mg, 81 mg, Oral, Daily, Agbata, Tochukwu, MD, 81 mg at 05/09/21 0831 ?  atorvastatin (LIPITOR) tablet 20 mg, 20 mg, Oral, q1800, Agbata, Tochukwu, MD, 20 mg at 05/08/21 1717 ?  azithromycin (ZITHROMAX) tablet 500 mg, 500 mg, Oral, Daily, Lanney Gins, Kary Sugrue, MD, 500 mg at 05/09/21 0814 ?  calcium-vitamin D (OSCAL WITH D) 500-5 MG-MCG per tablet 1 tablet, 1  tablet, Oral, Daily, Agbata, Tochukwu, MD, 1 tablet at 05/09/21 4818 ?  cholecalciferol (VITAMIN D3) tablet 1,000 Units, 1,000 Units, Oral, Daily, Agbata, Tochukwu, MD, 1,000 Units at 05/09/21 0832 ?  enoxaparin (LOVENOX) injection 40 mg, 40 mg, Subcutaneous, Q24H, Agbata, Tochukwu, MD, 40 mg at 05/08/21 2130 ?  ipratropium-albuterol (DUONEB) 0.5-2.5 (3) MG/3ML nebulizer solution 3 mL, 3 mL, Nebulization, Q6H PRN, Agbata, Tochukwu, MD ?  losartan (COZAAR) tablet 12.5 mg, 12.5 mg, Oral, Daily, Agbata, Tochukwu, MD, 12.5 mg at 05/09/21 0831 ?  mometasone-formoterol (DULERA) 100-5 MCG/ACT inhaler 2 puff, 2 puff, Inhalation, BID, Agbata, Tochukwu, MD, 2 puff at 05/08/21 1442 ?  multivitamin with minerals tablet 1 tablet, 1 tablet, Oral, Daily, Agbata, Tochukwu, MD, 1 tablet at 05/09/21 0831 ?  omega-3 acid ethyl esters (LOVAZA) capsule 1 g, 1 g, Oral, Daily, Agbata, Tochukwu, MD, 1 g at 05/09/21 0831 ?  ondansetron (ZOFRAN) tablet 4 mg, 4 mg, Oral, Q6H PRN **OR** ondansetron (ZOFRAN) injection 4 mg, 4 mg, Intravenous, Q6H PRN, Agbata, Tochukwu, MD ?  [COMPLETED] methylPREDNISolone sodium succinate (SOLU-MEDROL) 40 mg/mL injection 40 mg, 40 mg, Intravenous, Q12H, 40 mg at 05/09/21 0038 **FOLLOWED BY** predniSONE (DELTASONE) tablet 40 mg, 40 mg, Oral, Q breakfast, Agbata, Tochukwu, MD ?  sodium chloride flush (NS) 0.9 % injection 3 mL, 3 mL, Intravenous, Q12H, Agbata, Tochukwu, MD, 3 mL at 05/09/21 5631 ?  sodium chloride flush (NS) 0.9 % injection 3 mL, 3 mL, Intravenous, PRN, Agbata, Tochukwu, MD ? ? ? ?ALLERGIES  ? ?Bee venom, Codeine, Morphine and related, Shellfish allergy, Bactrim [sulfamethoxazole-trimethoprim], Norvasc [amlodipine], and Nitrofurantoin ? ? ? ? ?REVIEW OF SYSTEMS  ? ? ?Review of Systems: ? ?Gen:  Denies  fever, sweats, chills weigh loss  ?HEENT: Denies blurred vision, double vision, ear pain, eye pain, hearing loss, nose bleeds, sore throat ?Cardiac:  No dizziness, chest pain or heaviness, chest  tightness,edema ?Resp:   reports dyspnea chronically  ?Gi: Denies swallowing difficulty, stomach pain, nausea or vomiting, diarrhea, constipation, bowel incontinence ?Gu:  Denies bladder incontinence, burning urine ?Ext:   Denies Joint pain, stiffness or swelling ?Skin: Denies  skin rash, easy bruising or bleeding or hives ?Endoc:  Denies polyuria, polydipsia , polyphagia or weight change ?Psych:   Denies depression, insomnia or hallucinations  ? ?Other:  All other systems negative ? ? ?VS: BP 121/63 (BP Location: Left Arm)   Pulse 78   Temp 98 ?F (36.7 ?C)   Resp  16   Ht '5\' 2"'$  (1.575 m)   Wt 50.8 kg   LMP  (LMP Unknown)   SpO2 96%   BMI 20.49 kg/m?   ? ? ? ?PHYSICAL EXAM  ? ? ?GENERAL:NAD, no fevers, chills, no weakness no fatigue ?HEAD: Normocephalic, atraumatic.  ?EYES: Pupils equal, round, reactive to light. Extraocular muscles intact. No scleral icterus.  ?MOUTH: Moist mucosal membrane. Dentition intact. No abscess noted.  ?EAR, NOSE, THROAT: Clear without exudates. No external lesions.  ?NECK: Supple. No thyromegaly. No nodules. No JVD.  ?PULMONARY: decreased breath sounds with mild rhonchi worse at bases bilaterally.  ?CARDIOVASCULAR: S1 and S2. Regular rate and rhythm. No murmurs, rubs, or gallops. No edema. Pedal pulses 2+ bilaterally.  ?GASTROINTESTINAL: Soft, nontender, nondistended. No masses. Positive bowel sounds. No hepatosplenomegaly.  ?MUSCULOSKELETAL: No swelling, clubbing, or edema. Range of motion full in all extremities.  ?NEUROLOGIC: Cranial nerves II through XII are intact. No gross focal neurological deficits. Sensation intact. Reflexes intact.  ?SKIN: No ulceration, lesions, rashes, or cyanosis. Skin warm and dry. Turgor intact.  ?PSYCHIATRIC: Mood, affect within normal limits. The patient is awake, alert and oriented x 3. Insight, judgment intact.  ? ? ?  ? ?IMAGING  ? ? ? ?ASSESSMENT/PLAN  ? ? ? ?Pulmonary fibrosis  ?   This is likely related to microaspiration and GERD secondary  to large hiatal hernia ?- speech and swallow evaluation with evaluation of GERD ?Rvp  ?Crp ?Zithromax  ?  ? ? ?Severe GERD  ?   - continue protonix  ? ? ?Accelerated HTN     ?    Improved  ? ? ? ? ?Thank you for

## 2021-05-09 NOTE — Progress Notes (Incomplete)
?PROGRESS NOTE ? ?Wanda Nelson    DOB: 04-05-1931, 88 y.o.  ?HQI:[id number]  ?  Code Status: Full Code   ?DOA: 05/08/2021   LOS: 0  ? ?Brief hospital course  ?[name] is a 88 y.o. female with a PMH significant for ***. ? ?They presented from *** to the ED on 05/08/2021 with *** x *** days. *** ? ?In the ED, it was found that they had ***.  ?Significant findings included ***. ? ?They were treated with ***.  ?Patient was admitted to medicine service for further workup and management of *** as outlined in detail below. ? ?05/09/21 -*** ? ?Assessment & Plan  ?Principal Problem: ?  Pulmonary fibrosis ([place]) ?Active Problems: ?  History of hiatal hernia ?  Essential hypertension ? ?*** ?-  ? ?*** ?-  ? ?*** ?-  ? ?*** ?-  ? ?*** ?-  ? ?Body mass index is 20.49 kg/m?. ? ?VTE ppx: enoxaparin (LOVENOX) injection 40 mg Start: 05/08/21 2200 ? ? ?Diet:  ?   ?Diet  ? Diet Heart Room service appropriate? Yes; Fluid consistency: Thin  ? ?Consultants: ?*** ?Subjective 05/09/21   ? ?Pt reports *** ?  ?Objective  ? ?Vitals:  ? 05/08/21 1845 05/08/21 2059 05/09/21 1324 05/09/21 4010  ?BP: (!) 163/78 124/70 (!) 116/58 125/72  ?Pulse: 92 91 86 76  ?Resp: '18 20 18 16  '$ ?Temp: 98.1 ?F (36.7 ?C) 98.5 ?F (36.9 ?C) 98 ?F (36.7 ?C) 98.1 ?F (36.7 ?C)  ?TempSrc:      ?SpO2: 94% 96% 96% 98%  ?Weight:      ?Height:      ? ?No intake or output data in the 24 hours ending 05/09/21 0735 ?Filed Weights  ? 05/08/21 0648  ?Weight: 50.8 kg  ?  ? ?Physical Exam: *** ?General: awake, alert, NAD ?HEENT: atraumatic, clear conjunctiva, anicteric sclera, MMM, hearing grossly normal ?Respiratory: normal respiratory effort. ?Cardiovascular: quick capillary refill, normal S1/S2, RRR, no JVD, murmurs ?Gastrointestinal: soft, NT, ND ?Nervous: A&O x3. no gross focal neurologic deficits, normal speech ?Extremities: moves all equally, no edema, normal tone ?Skin: dry, intact, normal temperature, normal color. No rashes, lesions or ulcers on exposed  skin ?Psychiatry: normal mood, congruent affect ? ?Labs   ?I have personally reviewed the following labs and imaging studies ?CBC ?   ?Component Value Date/Time  ? WBC 8.9 05/09/2021 0439  ? RBC 5.19 (H) 05/09/2021 0439  ? HGB 16.2 (H) 05/09/2021 0439  ? HCT 48.2 (H) 05/09/2021 0439  ? PLT 222 05/09/2021 0439  ? MCV 92.9 05/09/2021 0439  ? MCH 31.2 05/09/2021 0439  ? MCHC 33.6 05/09/2021 0439  ? RDW 12.7 05/09/2021 0439  ? LYMPHSABS 1.9 05/08/2021 0654  ? MONOABS 0.7 05/08/2021 0654  ? EOSABS 0.6 (H) 05/08/2021 0654  ? BASOSABS 0.1 05/08/2021 0654  ? ? ?  Latest Ref Rng & Units 05/09/2021  ?  4:39 AM 05/08/2021  ?  6:54 AM 11/16/2020  ? 10:21 AM  ?BMP  ?Glucose 70 - 99 mg/dL 121   91   82    ?BUN 8 - 23 mg/dL '25   17   16    '$ ?Creatinine 0.44 - 1.00 mg/dL 0.92   0.86   0.78    ?Sodium 135 - 145 mmol/L 136   137   138    ?Potassium 3.5 - 5.1 mmol/L 4.1   3.9   4.0    ?Chloride 98 - 111 mmol/L 105   104  103    ?CO2 22 - 32 mmol/L '21   26   29    '$ ?Calcium 8.9 - 10.3 mg/dL 9.7   9.2   9.1    ? ? ?DG Chest 2 View ? ?Result Date: 05/08/2021 ?CLINICAL DATA:  Shortness of breath for 2-3 days EXAM: CHEST - 2 VIEW COMPARISON:  08/31/2020 FINDINGS: Chronic lung disease with diffuse reticulation. Stable generous heart size with large hiatal hernia seen on the lateral view. Prior median sternotomy with left atrial clipping. There is no edema, consolidation, effusion, or pneumothorax. IMPRESSION: 1. No acute finding when compared to prior. 2. Chronic interstitial lung disease. 3. Large hiatal hernia. Electronically Signed   By: Jorje Guild M.D.   On: 05/08/2021 07:28  ? ?CT Angio Chest PE W and/or Wo Contrast ? ?Result Date: 05/08/2021 ?CLINICAL DATA:  Shortness of breath for 2-3 days and positive D-dimer. EXAM: CT ANGIOGRAPHY CHEST WITH CONTRAST TECHNIQUE: Multidetector CT imaging of the chest was performed using the standard protocol during bolus administration of intravenous contrast. Multiplanar CT image reconstructions and MIPs  were obtained to evaluate the vascular anatomy. RADIATION DOSE REDUCTION: This exam was performed according to the departmental dose-optimization program which includes automated exposure control, adjustment of the mA and/or kV according to patient size and/or use of iterative reconstruction technique. CONTRAST:  7m OMNIPAQUE IOHEXOL 350 MG/ML SOLN COMPARISON:  08/31/2020 FINDINGS: Cardiovascular: The heart is mildly enlarged but appears stable. The thoracic aorta is normal in caliber. Mild tortuosity and moderate atherosclerotic calcification but no focal aneurysm or dissection. The branch vessels are patent. A left atrial closure device is noted. The pulmonary arterial tree is well opacified. No filling defects to suggest pulmonary embolism. Mediastinum/Nodes: No mediastinal or hilar mass or adenopathy. There is a very large hiatal hernia with most of the stomach up in the chest. This appears stable. Organoaxial volvulus noted. Lungs/Pleura: Chronic changes of pulmonary fibrosis. No definite acute overlying pulmonary process. No pleural effusions. Upper Abdomen: No significant upper abdominal findings. Musculoskeletal: No breast masses, supraclavicular or axillary adenopathy. The bony thorax is intact. Review of the MIP images confirms the above findings. IMPRESSION: 1. No CT findings for pulmonary embolism. 2. Normal caliber thoracic aorta.  No dissection or aneurysm. 3. Very large hiatal hernia with most of the stomach up in the chest. Organoaxial volvulus noted. 4. Chronic changes of pulmonary fibrosis. No definite acute overlying pulmonary process. Aortic Atherosclerosis (ICD10-I70.0) and Emphysema (ICD10-J43.9). Electronically Signed   By: PMarijo SanesM.D.   On: 05/08/2021 11:06  ? ?ECHOCARDIOGRAM COMPLETE ? ?Result Date: 05/08/2021 ?   ECHOCARDIOGRAM REPORT   [facility] Medical Rec #:  [id number]            Height:       62.0 in Accession #:    [id number]            Weight:       112.0 lb Date of Birth:  [birth year]            BSA:          1.494 m? Patient Age:    89years              BP:           172/74 mmHg Patient Gender: F                     HR:  75 bpm. Exam Location:  [facility] Procedure: 2D Echo, Color Doppler and Cardiac Doppler Indications:     I51.7 Cardiomegaly  History:         Patient has prior history of Echocardiogram examinations.                  Resection of left atrium myxoma in 2020; Risk                  Factors:Dyslipidemia and Hypertension.  Sonographer:     Rosalia Hammers Referring Phys:  [id number] MIWOEHOZ AGBATA Diagnosing Phys: [name] MD  Sonographer Comments: No subcostal window and suboptimal apical window. IMPRESSIONS  1. Left ventricular ejection fraction, by estimation, is 60 to 65%. The left ventricle has normal function. The left ventricle has no regional wall motion abnormalities. There is mild left ventricular hypertrophy. Left ventricular diastolic parameters were normal.  2. Right ventricular systolic function is normal. The right ventricular size is normal. Tricuspid regurgitation signal is inadequate for assessing PA pressure.  3. The mitral valve is normal in structure. No evidence of mitral valve regurgitation. No evidence of mitral stenosis.  4. Tricuspid valve regurgitation is mild to moderate.  5. The aortic valve is normal in structure. Aortic valve regurgitation is not visualized. Aortic valve sclerosis/calcification is present, without any evidence of aortic stenosis. FINDINGS  Left Ventricle: Left ventricular ejection fraction, by estimation, is 60 to 65%. The left ventricle has normal function. The left ventricle has no regional wall motion abnormalities. The left ventricular internal cavity size was normal in size. There is  mild left ventricular hypertrophy. Left ventricular diastolic parameters were normal. Right Ventricle: The right ventricular size is normal. No increase in right ventricular wall  thickness. Right ventricular systolic function is normal. Tricuspid regurgitation signal is inadequate for assessing PA pressure. Left Atrium: Left atrial size was normal in size. Right Atrium: Right atrial s

## 2021-05-09 NOTE — TOC Initial Note (Addendum)
Transition of Care (TOC) - Initial/Assessment Note  ? ? ?Patient Details  ?Name: Wanda Nelson ?MRN: [id number] ?Date of Birth: 10-27-1931 ? ?Transition of Care (TOC) CM/SW Contact:    ?Wanda Pelt, RN ?Phone Number: ?05/09/2021, 11:40 AM ? ?Clinical Narrative: Spoke to daughter Wanda Nelson, who is typically caregiver.  Wanda Nelson is currently out of town and patient's son will be staying with her on DC. ? ?Patient lives alone at [facility].  Son will transport her home. ? ?Daughter would like to speak to MD with questions regarding dc.  Dr. Ouida Sills notified. ? ?Patient is anticipated to [facility].                ?  ?Addendum:  patient does not qualify through medicare for a nebulizer as per [employer].  Zack from [employer] to contact patient's daughter to inform her.  If patient's family agrees to self-pay, [employer] can deliver nebulizer to patient's home.  Care team aware. ? ?Expected Discharge Plan:  ([facility]) ?Barriers to Discharge: Barriers Resolved ? ? ?Patient Goals and CMS Choice ?  ?  ?  ? ?Expected Discharge Plan and Services ?Expected Discharge Plan:  ([facility]) ?  ?  ?  ?Living arrangements for the past 2 months: [place] ?                ?  ?  ?  ?  ?  ?  ?  ?  ?  ?  ? ?Prior Living Arrangements/Services ?Living arrangements for the past 2 months: [place] ?Lives with:: Facility Resident ?Patient language and need for interpreter reviewed:: Yes ?Do you feel safe going back to the place where you live?: Yes      ?Need for Family Participation in Patient Care: Yes (Comment) ?Care giver support system in place?: Yes (comment) ?  ?Criminal Activity/Legal Involvement Pertinent to Current Situation/Hospitalization: No - Comment as needed ? ?Activities of Daily Living ?Home Assistive Devices/Equipment: None ?ADL Screening (condition at time of admission) ?Patient's cognitive ability adequate to safely  complete daily activities?: Yes ?Is the patient deaf or have difficulty hearing?: No ?Does the patient have difficulty seeing, even when wearing glasses/contacts?: No ?Does the patient have difficulty concentrating, remembering, or making decisions?: No ?Patient able to express need for assistance with ADLs?: Yes ?Does the patient have difficulty dressing or bathing?: No ?Independently performs ADLs?: Yes (appropriate for developmental age) ?Does the patient have difficulty walking or climbing stairs?: Yes ?Weakness of Legs: Both ?Weakness of Arms/Hands: None ? ?Permission Sought/Granted ?Permission sought to share information with : Case Manager ?Permission granted to share information with : Yes, Verbal Permission Granted ?   ? Permission granted to share info w AGENCY: [place] ?   ?   ? ?Emotional Assessment ?Appearance:: Appears stated age ?Attitude/Demeanor/Rapport:  (Spoke with daughter) ?  ?  ?Alcohol / Substance Use: Not Applicable ?Psych Involvement: No (comment) ? ?Admission diagnosis:  Pulmonary fibrosis ([place]) [J84.10] ?Hypoxia [R09.02] ?Dyspnea, unspecified type [R06.00] ?Patient Active Problem List  ? Diagnosis Date Noted  ? Pulmonary fibrosis ([place]) 05/08/2021  ? History of hiatal hernia   ? Essential hypertension   ? Dyspnea   ? Hearing loss of left ear 11/16/2020  ? Lung fibrosis ([place]) 11/16/2020  ? Aortic atherosclerosis ([place]) 11/16/2020  ? Pain due to onychomycosis of toenails of both feet 07/28/2020  ? Viral URI 05/31/2020  ? UTI (urinary tract infection) 10/04/2018  ?  Iron deficiency anemia 07/31/2018  ? Gastroesophageal reflux disease 07/31/2018  ? Atherosclerosis 07/31/2018  ? Incidental pulmonary nodule, > 61m and < 838m03/12/2018  ? s/p resection left atrial myxoma 03/19/2018  ? HLD (hyperlipidemia) 03/15/2015  ? History of stroke 03/15/2015  ? Glaucoma 03/15/2015  ? ?PCP:  McLean-Scocuzza, TrNino GlowMD ?Pharmacy:   ?[address] [address] [place]Phone: [phone]: [phone] ?[place] [address]Phone: [phone]: [phone] ?[id number] [address]hone: [phone]: [phone]? ? ? ?Social Determinants of Health (SDOH) Interventions ?  ? ?Readmission Risk Interventions ?   ? View : No data to display.  ?  ?  ?  ? ? ? ?

## 2021-05-09 NOTE — Discharge Instructions (Signed)
Please follow up with pulmonology outpatient to review your medications and pursue further evaluation of your lung function if needed.  ?Until then: ?- you have a twice daily inhaler ?- nebulizer treatment to use as needed if having difficulty breathing and/or wheezing ?- 3 days of continued antibiotic and steroid  ? ?Other things to help prevent reoccurrence of symptoms ?- daily allergy treatment such as loratadine ?- acid reflux treatment with things like pepcid and elevated head of bed at night, limiting food in hours leading up to bed time. Avoiding spicy foods.    ? - I do not recommend long term use of omeprazole or other similar medications due to their side effect of decreased bone density and increased risk of infection when used for prolonged periods of time.  ? ?Increase your activity level slowly and take frequent breaks when needed. Call 911 if you feel that you cannot breath ?

## 2021-05-09 NOTE — Evaluation (Signed)
Physical Therapy Evaluation ?Patient Details ?Name: Wanda Nelson ?MRN: [id number] ?DOB: May 24, 1931 ?Today's Date: 05/09/2021 ? ?History of Present Illness ? Pt is an 88 y.o. female with medical history significant for brain aneurysm, dyslipidemia, pulmonary fibrosis and status post left atrial myxoma resection who presents to the ER for evaluation of worsening exertional shortness of breath. ?  ?Clinical Impression ? Pt was pleasant and motivated to participate during the session and put forth good effort throughout. Vitals taken during graded ambulation distances on room air as follows: baseline at rest: SpO2 96%, HR 86 bpm, SOB 0/10; after amb 30 feet SpO2 95%, HR 96, SOB 0/10; after amb 80 feet SpO2 93%, HR 99, SOB 2/10; after amb 175 feet SpO2 91%, HR 102, and SOB 4/10.  Pt reported feeling subjectively better with ambulation than she did this morning and reported no desire for further PT services.  Pt stated participates in balance and exercise classes at [facility] and ambulates with a neighbor up to 3 miles daily.  Will complete PT orders at this time but will reassess pt pending a change in status upon receipt of new PT orders. ?  ?   ?   ? ?Recommendations for follow up therapy are one component of a multi-disciplinary discharge planning process, led by the attending physician.  Recommendations may be updated based on patient status, additional functional criteria and insurance authorization. ? ?Follow Up Recommendations No PT follow up ? ?  ?Assistance Recommended at Discharge None  ?Patient can return home with the following ? Assist for transportation ? ?  ?Equipment Recommendations None recommended by PT  ?Recommendations for Other Services ?    ?  ?Functional Status Assessment Patient has not had a recent decline in their functional status  ? ?  ?Precautions / Restrictions Precautions ?Precautions: Fall ?Restrictions ?Weight Bearing Restrictions: No  ? ?  ? ?Mobility ? Bed Mobility ?Overal bed  mobility: Independent ?  ?  ?  ?  ?  ?  ?  ?  ? ?Transfers ?Overall transfer level: Independent ?  ?  ?  ?  ?  ?  ?  ?  ?General transfer comment: Good eccentric and concentric control and stability ?  ? ?Ambulation/Gait ?Ambulation/Gait assistance: Supervision ?Gait Distance (Feet): 175 Feet x 1, 80 Feet x 1, 30 Feet x 1 ?Assistive device: None ?Gait Pattern/deviations: Drifts right/left, Decreased stride length ?Gait velocity: WNL ?  ?  ?General Gait Details: Mild drifting left/right that pt stated has been chronic for many years since her brain aneurysm ? ?Stairs ?  ?  ?  ?  ?  ? ?Wheelchair Mobility ?  ? ?Modified Rankin (Stroke Patients Only) ?  ? ?  ? ?Balance Overall balance assessment: No apparent balance deficits (not formally assessed) ?  ?  ?  ?  ?  ?  ?  ?  ?  ?  ?  ?  ?  ?  ?  ?  ?  ?  ?   ? ? ? ?Pertinent Vitals/Pain Pain Assessment ?Pain Assessment: No/denies pain  ? ? ?Home Living Family/patient expects to be discharged to:: Private residence ?Living Arrangements: Children ?Available Help at Discharge: Family;Available 24 hours/day ?Type of Home: Independent living facility ?Home Access: Level entry ?  ?  ?  ?Home Layout: One level ?Home Equipment: Conservation officer, nature (2 wheels);Rollator (4 wheels);Cane - single point;Shower seat;Wheelchair - manual ?   ?  ?Prior Function Prior Level of Function : Independent/Modified Independent ?  ?  ?  ?  ?  ?  ?  Mobility Comments: Ind amb without an AD community distances, ambulates 3 miles/day, no fall history ?ADLs Comments: Ind with ADLs ?  ? ? ?Hand Dominance  ?   ? ?  ?Extremity/Trunk Assessment  ? Upper Extremity Assessment ?Upper Extremity Assessment: Overall WFL for tasks assessed ?  ? ?Lower Extremity Assessment ?Lower Extremity Assessment: Overall WFL for tasks assessed ?  ? ?   ?Communication  ? Communication: No difficulties  ?Cognition Arousal/Alertness: Awake/alert ?Behavior During Therapy: [facility] for tasks assessed/performed ?Overall Cognitive Status:  Within Functional Limits for tasks assessed ?  ?  ?  ?  ?  ?  ?  ?  ?  ?  ?  ?  ?  ?  ?  ?  ?  ?  ?  ? ?  ?General Comments   ? ?  ?Exercises    ? ?Assessment/Plan  ?  ?PT Assessment Patient does not need any further PT services  ?PT Problem List   ? ?   ?  ?PT Treatment Interventions     ? ?PT Goals (Current goals can be found in the Care Plan section)  ?Acute Rehab PT Goals ?PT Goal Formulation: All assessment and education complete, DC therapy ? ?  ?Frequency   ?  ? ? ?Co-evaluation   ?  ?  ?  ?  ? ? ?  ?AM-PAC PT "6 Clicks" Mobility  ?Outcome Measure Help needed turning from your back to your side while in a flat bed without using bedrails?: None ?Help needed moving from lying on your back to sitting on the side of a flat bed without using bedrails?: None ?Help needed moving to and from a bed to a chair (including a wheelchair)?: None ?Help needed standing up from a chair using your arms (e.g., wheelchair or bedside chair)?: None ?Help needed to walk in hospital room?: None ?Help needed climbing 3-5 steps with a railing? : None ?6 Click Score: 24 ? ?  ?End of Session Equipment Utilized During Treatment: Gait belt ?Activity Tolerance: Patient tolerated treatment well ?Patient left: in chair;with call bell/phone within reach ?Nurse Communication: Mobility status ?PT Visit Diagnosis: Difficulty in walking, not elsewhere classified (R26.2) ?  ? ?Time: 1332-1400 ?PT Time Calculation (min) (ACUTE ONLY): 28 min ? ? ?Charges:   PT Evaluation ?$PT Eval Low Complexity: 1 Low ?  ?  ?   ? ?D. Royetta Asal PT, DPT ?05/09/21, 2:14 PM ? ? ?

## 2021-05-09 NOTE — Discharge Summary (Signed)
? ? ?Physician Discharge Summary  ?Patient: Wanda Nelson GGE:[id number] DOB: [birth year]   Code Status: Full Code ?Admit date: 05/08/2021 ?Discharge date: 05/09/2021 ?Disposition: Nursing home, PT ?PCP: McLean-Scocuzza, Nino Glow, MD ? ?Recommendations for Outpatient Follow-up:  ?Follow up with PCP within 1-2 weeks ?Monitor metabolic panel, kidney function ?Follow-up with pulmonology ? ?Discharge Diagnoses:  ?Principal Problem: ?  Pulmonary fibrosis ([place]) ?Active Problems: ?  History of hiatal hernia ?  Essential hypertension ? ?Brief Hospital Course Summary: ?Ulyana Pitones is a 88 y.o. female with a PMH significant for brain aneurysm, dyslipidemia, pulmonary fibrosis and status post left atrial myxoma resection. ?  ?They presented from home at ALF to the ED on 05/08/2021 with dyspnea on exertion x 2 days.  At baseline, patient walks at least 3 miles per day and does several exercise classes per week.  But upon presentation she had dyspnea even walking to the bathroom. ?  ?In the ED, it was found that they had hypertension with blood pressure as high as 191/72, bradycardia in mid 50s but had no oxygen desaturation and did not require oxygen supplementation.  ?Significant findings included negative respiratory panel, hemoglobin 15.9, WBC 6.7, normal metabolic panel, negative BNP, negative troponins.   ?Chest x-ray- no acute findings but significant chronic interstitial lung disease and a large hiatal hernia.  CTA was negative for PE. ?  ?They were initially treated with breathing treatments, azithromycin, methylprednisolone and hydralazine for treatment of presumed exacerbation of her chronic pulmonary fibrosis as well as hypertensive emergency. ?Cardiology and pulmonology were both consulted for evaluation. ? ?Dyspnea on exertion-patient had improvement in her symptoms with treatment and was able to ambulate around unit with pulse ox showing oxygen saturations at 92% and above on room air.  Pulmonology  evaluated and recommended increased treatment of acid reflux symptoms which could be contributing to her exacerbation.  I started patient on allergy treatment for other possible exacerbating factor prevention.  Cardiology evaluated and did not think that cardiac cause contributing to her respiratory symptoms.  She was discharged in stable condition with a prescription for completed steroid, antibiotic therapy and nebulizer treatments. ? ?HTN-blood pressure improved significantly on her home regimen.  No medication changes were necessary. ? ?Discharge Condition: Good, improved ?Recommended discharge diet: Regular healthy diet ? ?Consultations: ?Pulmonology ?Cardiology ? ?Procedures/Studies: ?CTA chest ? ?Discharge Instructions   ? ? Discharge patient   Complete by: As directed ?  ? ALF  ? Discharge disposition: [facility] Not Defined  ? Discharge patient date: 05/09/2021  ? For home use only DME Nebulizer machine   Complete by: As directed ?  ? Patient needs a nebulizer to treat with the following condition: Pulmonary fibrosis ([place])  ? Length of Need: 6 Months  ? ?  ? ?Allergies as of 05/09/2021   ? ?   Reactions  ? Bee Venom Anaphylaxis, Hives  ? Codeine Other (See Comments)  ? Altered mental status ?Altered mental status  ? Morphine And Related   ? Altered mental status  ? Shellfish Allergy Anaphylaxis, Hives  ? Mainly shrimp  ? Bactrim [sulfamethoxazole-trimethoprim]   ? ? Reaction   ? Norvasc [amlodipine]   ? 2.5 leg edema  ? Nitrofurantoin Rash  ? ?  ? ?  ?Medication List  ?  ? ?STOP taking these medications   ? ?omeprazole 20 MG capsule ?Commonly known as: PRILOSEC ?  ? ?  ? ?TAKE these medications   ? ?acetaminophen 325 MG tablet ?Commonly known  as: TYLENOL ?Take 2 tablets (650 mg total) by mouth every 6 (six) hours as needed for mild pain (or Fever >/= 101). ?  ?aspirin EC 81 MG tablet ?Take 1 tablet (81 mg total) by mouth daily. ?  ?atorvastatin 20 MG tablet ?Commonly known as:  LIPITOR ?Take 1 tablet (20 mg total) by mouth daily at 6 PM. ?  ?azithromycin 500 MG tablet ?Commonly known as: ZITHROMAX ?Take 1 tablet (500 mg total) by mouth daily for 3 days. ?Start taking on: May 10, 2021 ?  ?Calcium Carbonate-Vitamin D 500-125 MG-UNIT Tabs ?Take 1 tablet by mouth daily. ?  ?cholecalciferol 1000 units tablet ?Commonly known as: VITAMIN D ?Take 1,000 Units by mouth daily. ?  ?ferrous sulfate 325 (65 FE) MG tablet ?Commonly known as: FeroSul ?Take 1 tablet (325 mg total) by mouth daily with breakfast. With vitamin C 500 mg daily ?  ?Fish Oil 1200 MG Caps ?Take by mouth daily at 8 pm. ?  ?Flax Seed Oil 1300 MG Caps ?Take 1,100 mg by mouth daily. ?  ?ipratropium-albuterol 0.5-2.5 (3) MG/3ML Soln ?Commonly known as: DUONEB ?Take 3 mLs by nebulization every 6 (six) hours as needed. ?  ?loratadine 10 MG tablet ?Commonly known as: CLARITIN ?Take 1 tablet (10 mg total) by mouth daily. ?Start taking on: May 10, 2021 ?  ?losartan 25 MG tablet ?Commonly known as: COZAAR ?Take 0.5 tablets (12.5 mg total) by mouth daily. Dc norvasc 2.5 mg qd ?  ?mometasone-formoterol 100-5 MCG/ACT Aero ?Commonly known as: DULERA ?Inhale 2 puffs into the lungs 2 (two) times daily. ?  ?multivitamin tablet ?Take 1 tablet by mouth daily. ?  ?predniSONE 20 MG tablet ?Commonly known as: DELTASONE ?Take 2 tablets (40 mg total) by mouth daily with breakfast for 3 days. ?Start taking on: May 10, 2021 ?  ?VITAMIN C PO ?Take by mouth daily at 12 noon. ?  ? ?  ? ?  ?  ? ? ?  ?Durable Medical Equipment  ?(From admission, onward)  ?  ? ? ?  ? ?  Start     Ordered  ? 05/09/21 0000  For home use only DME Nebulizer machine       ?Question Answer Comment  ?Patient needs a nebulizer to treat with the following condition Pulmonary fibrosis ([place])   ?Length of Need 6 Months   ?  ? 05/09/21 1333  ? ?  ?  ? ?  ? ? ? ?Subjective   ?Pt reports feeling much improved today.  She still feels that she is having some dyspnea when she is walking for more than  a minute or 2 around her room but she is much improved from when she first arrived.  Denies chest pain, lower extremity swelling.  She is looking forward to getting back to her home and understands that she needs to gradually increase her activity level listening to her body's limits. ?Objective  ?Blood pressure 121/63, pulse 78, temperature 98 ?F (36.7 ?C), resp. rate 16, height '5\' 2"'$  (1.575 m), weight 50.8 kg, SpO2 96 %.  ? ?General: Pt is alert, awake, not in acute distress ?Cardiovascular: RRR, S1/S2 +, no rubs, no gallops ?Respiratory: CTA bilaterally, no wheezing, no rhonchi ?Abdominal: Soft, NT, ND, bowel sounds + ?Extremities: no edema, no cyanosis ? ?The results of significant diagnostics from this hospitalization (including imaging, microbiology, ancillary and laboratory) are listed below for reference.  ? ?Imaging studies: ?DG Chest 2 View ? ?Result Date: 05/08/2021 ?CLINICAL DATA:  Shortness of breath  for 2-3 days EXAM: CHEST - 2 VIEW COMPARISON:  08/31/2020 FINDINGS: Chronic lung disease with diffuse reticulation. Stable generous heart size with large hiatal hernia seen on the lateral view. Prior median sternotomy with left atrial clipping. There is no edema, consolidation, effusion, or pneumothorax. IMPRESSION: 1. No acute finding when compared to prior. 2. Chronic interstitial lung disease. 3. Large hiatal hernia. Electronically Signed   By: Jorje Guild M.D.   On: 05/08/2021 07:28  ? ?CT Angio Chest PE W and/or Wo Contrast ? ?Result Date: 05/08/2021 ?CLINICAL DATA:  Shortness of breath for 2-3 days and positive D-dimer. EXAM: CT ANGIOGRAPHY CHEST WITH CONTRAST TECHNIQUE: Multidetector CT imaging of the chest was performed using the standard protocol during bolus administration of intravenous contrast. Multiplanar CT image reconstructions and MIPs were obtained to evaluate the vascular anatomy. RADIATION DOSE REDUCTION: This exam was performed according to the departmental dose-optimization program  which includes automated exposure control, adjustment of the mA and/or kV according to patient size and/or use of iterative reconstruction technique. CONTRAST:  43m OMNIPAQUE IOHEXOL 350 MG/ML SOLN COMPARISON:  08/24/

## 2021-05-16 ENCOUNTER — Encounter: Payer: Self-pay | Admitting: Internal Medicine

## 2021-05-16 ENCOUNTER — Ambulatory Visit (INDEPENDENT_AMBULATORY_CARE_PROVIDER_SITE_OTHER): Payer: Medicare Other | Admitting: Internal Medicine

## 2021-05-16 VITALS — BP 124/70 | HR 78 | Temp 97.5°F | Resp 14 | Ht 62.0 in | Wt 116.2 lb

## 2021-05-16 DIAGNOSIS — J309 Allergic rhinitis, unspecified: Secondary | ICD-10-CM

## 2021-05-16 DIAGNOSIS — K449 Diaphragmatic hernia without obstruction or gangrene: Secondary | ICD-10-CM | POA: Diagnosis not present

## 2021-05-16 DIAGNOSIS — R7989 Other specified abnormal findings of blood chemistry: Secondary | ICD-10-CM

## 2021-05-16 DIAGNOSIS — Z1231 Encounter for screening mammogram for malignant neoplasm of breast: Secondary | ICD-10-CM

## 2021-05-16 DIAGNOSIS — D509 Iron deficiency anemia, unspecified: Secondary | ICD-10-CM

## 2021-05-16 DIAGNOSIS — R053 Chronic cough: Secondary | ICD-10-CM | POA: Diagnosis not present

## 2021-05-16 DIAGNOSIS — I10 Essential (primary) hypertension: Secondary | ICD-10-CM

## 2021-05-16 DIAGNOSIS — J8410 Pulmonary fibrosis, unspecified: Secondary | ICD-10-CM

## 2021-05-16 DIAGNOSIS — E785 Hyperlipidemia, unspecified: Secondary | ICD-10-CM | POA: Diagnosis not present

## 2021-05-16 DIAGNOSIS — N390 Urinary tract infection, site not specified: Secondary | ICD-10-CM

## 2021-05-16 HISTORY — DX: Diaphragmatic hernia without obstruction or gangrene: K44.9

## 2021-05-16 LAB — BASIC METABOLIC PANEL
BUN: 27 mg/dL — ABNORMAL HIGH (ref 6–23)
CO2: 29 mEq/L (ref 19–32)
Calcium: 9.1 mg/dL (ref 8.4–10.5)
Chloride: 103 mEq/L (ref 96–112)
Creatinine, Ser: 0.88 mg/dL (ref 0.40–1.20)
GFR: 58.79 mL/min — ABNORMAL LOW (ref >60.00)
Glucose, Bld: 80 mg/dL (ref 70–99)
Potassium: 4.3 mEq/L (ref 3.5–5.1)
Sodium: 139 mEq/L (ref 135–145)

## 2021-05-16 LAB — LIPID PANEL
Cholesterol: 167 mg/dL (ref 0–200)
HDL: 59.8 mg/dL (ref >39.00)
LDL Cholesterol: 84 mg/dL (ref 0–99)
NonHDL: 106.98
Total CHOL/HDL Ratio: 3
Triglycerides: 113 mg/dL (ref 0.0–149.0)
VLDL: 22.6 mg/dL (ref 0.0–40.0)

## 2021-05-16 LAB — CBC WITH DIFFERENTIAL/PLATELET
Basophils Absolute: 0 10*3/uL (ref 0.0–0.1)
Basophils Relative: 0.4 % (ref 0.0–3.0)
Eosinophils Absolute: 0.9 10*3/uL — ABNORMAL HIGH (ref 0.0–0.7)
Eosinophils Relative: 8.4 % — ABNORMAL HIGH (ref 0.0–5.0)
HCT: 42.8 % (ref 36.0–46.0)
Hemoglobin: 13.9 g/dL (ref 12.0–15.0)
Lymphocytes Relative: 24.4 % (ref 12.0–46.0)
Lymphs Abs: 2.5 10*3/uL (ref 0.7–4.0)
MCHC: 32.6 g/dL (ref 30.0–36.0)
MCV: 97.4 fl (ref 78.0–100.0)
Monocytes Absolute: 0.9 10*3/uL (ref 0.1–1.0)
Monocytes Relative: 8.8 % (ref 3.0–12.0)
Neutro Abs: 6 10*3/uL (ref 1.4–7.7)
Neutrophils Relative %: 58 % (ref 43.0–77.0)
Platelets: 193 10*3/uL (ref 150.0–400.0)
RBC: 4.39 Mil/uL (ref 3.87–5.11)
RDW: 13.4 % (ref 11.5–15.5)
WBC: 10.4 10*3/uL (ref 4.0–10.5)

## 2021-05-16 MED ORDER — ASPIRIN EC 81 MG PO TBEC: 81.0000 mg | DELAYED_RELEASE_TABLET | Freq: Every day | ORAL | 3 refills | Status: AC

## 2021-05-16 MED ORDER — FERROUS SULFATE 325 (65 FE) MG PO TABS: 325.0000 mg | ORAL_TABLET | Freq: Every day | ORAL | 3 refills | Status: DC

## 2021-05-16 MED ORDER — CIPROFLOXACIN HCL 250 MG PO TABS: 250.0000 mg | ORAL_TABLET | Freq: Two times a day (BID) | ORAL | 0 refills | Status: DC

## 2021-05-16 MED ORDER — LOSARTAN POTASSIUM 25 MG PO TABS: 12.5000 mg | ORAL_TABLET | Freq: Two times a day (BID) | ORAL | 3 refills | Status: DC

## 2021-05-16 MED ORDER — LORATADINE 10 MG PO TABS: 10.0000 mg | ORAL_TABLET | Freq: Every day | ORAL | 3 refills | Status: DC

## 2021-05-16 MED ORDER — ATORVASTATIN CALCIUM 20 MG PO TABS: 20.0000 mg | ORAL_TABLET | Freq: Every day | ORAL | 3 refills | Status: DC

## 2021-05-16 MED ORDER — BENZONATATE 200 MG PO CAPS: 200.0000 mg | ORAL_CAPSULE | Freq: Three times a day (TID) | ORAL | 0 refills | Status: DC | PRN

## 2021-05-16 NOTE — Patient Instructions (Addendum)
Dr. Ottie Glazier ? ? ?Phone Fax E-mail Address  ?[phone] Not available [address]  ? [place] [address]  ?   ?Specialties     ?Pulmonary Disease     ?      ? ? ?

## 2021-05-16 NOTE — Progress Notes (Signed)
Chief Complaint  ?Patient presents with  ? Follow-up  ?  6 mon, was admitted at [facility] 1 wk ago due to SOB due to lung scarring. She is feeling much better was given nebulizer tx. Had labs at hospital. Denies any sob currently. Refill lipitor  ? ?HFU [facility] 5/1-05/09/21 for sob noted due to pulmonary fibrosis thought 2/2 to large hiatal hernia and bp was variably elevated in the hospital at home >130s/140s to 162/60s-85 pulse 56 to 86  ?Doing well as far as breathing now walked 2.5 miles today  ?She has used duoneb prn at home as well and better no sob today ? ?Review of Systems  ?Constitutional:  Negative for weight loss.  ?HENT:  Negative for hearing loss.   ?Eyes:  Negative for blurred vision.  ?Respiratory:  Positive for shortness of breath.   ?Cardiovascular:  Negative for chest pain.  ?Gastrointestinal:  Negative for abdominal pain and blood in stool.  ?Genitourinary:  Negative for dysuria.  ?Musculoskeletal:  Negative for falls and joint pain.  ?Skin:  Negative for rash.  ?Neurological:  Negative for headaches.  ?Psychiatric/Behavioral:  Negative for depression.   ?Past Medical History:  ?Diagnosis Date  ? Actinic keratosis   ? Anemia   ? COVID-19   ? 05/30/20 had covid 19 pill   ? Glaucoma   ? History of blood transfusion   ? History of hiatal hernia   ? Hyperlipidemia   ? Incidental pulmonary nodule, > 54m and < 867m3/12/2018  ? Noted on CT scan - needs f/u imaging 6 months  ? 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 Squamous cell carcinoma of skin 11/12/2019  ? L distal dorsum forearm [facility]   [facility]  ? ?Past Surgical History:  ?Procedure Laterality Date  ? ABDOMINAL HYSTERECTOMY  1982  ? total  ? BUNIONECTOMY Bilateral   ? CLIPPING OF ATRIAL APPENDAGE  03/21/2018  ? Procedure: Clipping Of Atrial Appendage - using an AtriCure 4553mlip;  Surgeon: OweRexene AlbertsD;  [facility]  ? EXCISION OF ATRIAL MYXOMA N/A 03/21/2018  ? Procedure: Excision Of Atrial Myxoma;   Surgeon: OweRexene AlbertsD;  [facility] [place];  Laterality: N/A;  ? RECTOCELE REPAIR    ? RIGHT/LEFT HEART CATH AND CORONARY ANGIOGRAPHY N/A 03/19/2018  ? Procedure: RIGHT/LEFT HEART CATH AND CORONARY ANGIOGRAPHY;  Surgeon: AriWellington HampshireD;  [facility] [place] [facility];  Service: Cardiovascular;  Laterality: N/A;  ? TEE WITHOUT CARDIOVERSION N/A 03/21/2018  ? Procedure: TRANSESOPHAGEAL ECHOCARDIOGRAM (TEE);  Surgeon: OweRexene AlbertsD;  [facility] [place];  Laterality: N/A;  ? TONSILLECTOMY    ? ?Family History  ?Problem Relation Age of Onset  ? Colon cancer Brother   ? Cancer Brother   ?     neck tumor cancerous   ? Esophageal varices Brother   ? Colon cancer Maternal Uncle   ? Colon cancer Maternal Grandmother   ? Kidney disease Brother   ? Coronary artery disease Brother   ?     with stent, cabg  ? Breast cancer Neg Hx   ? ?Social History  ? ?Socioeconomic History  ? Marital status: Widowed  ?  Spouse name: Not on file  ? Number of children: Not on file  ? Years of education: Not on file  ? Highest education level: Not on file  ?Occupational History  ?  Not on file  ?Tobacco Use  ? Smoking status: Never  ? Smokeless tobacco: Never  ?Vaping Use  ? Vaping Use: Never used  ?Substance and Sexual Activity  ? Alcohol use: No  ?  Alcohol/week: 0.0 standard drinks  ? Drug use: No  ? Sexual activity: Not Currently  ?Other Topics Concern  ? Not on file  ?Social History Narrative  ? Lives [place],  husband died 12-17-18 due to brain hemorrhage  ? Daughter in law Carmela Piechowski   ? widowed  ? ?Social Determinants of Health  ? ?Financial Resource Strain: Not on file  ?Food Insecurity: Not on file  ?Transportation Needs: Not on file  ?Physical Activity: Not on file  ?Stress: Not on file  ?Social Connections: Not on file  ?Intimate Partner Violence: Not on file  ? ?Current Meds  ?Medication Sig  ? acetaminophen (TYLENOL) 325 MG tablet Take 2 tablets (650 mg  total) by mouth every 6 (six) hours as needed for mild pain (or Fever >/= 101).  ? Ascorbic Acid (VITAMIN C PO) Take by mouth daily at 12 noon.  ? benzonatate (TESSALON) 200 MG capsule Take 1 capsule (200 mg total) by mouth 3 (three) times daily as needed for cough.  ? Calcium Carbonate-Vitamin D 500-125 MG-UNIT TABS Take 1 tablet by mouth daily.  ? cholecalciferol (VITAMIN D) 1000 units tablet Take 1,000 Units by mouth daily.  ? Flaxseed, Linseed, (FLAX SEED OIL) 1300 MG CAPS Take 1,100 mg by mouth daily.   ? ipratropium-albuterol (DUONEB) 0.5-2.5 (3) MG/3ML SOLN Take 3 mLs by nebulization every 6 (six) hours as needed.  ? Multiple Vitamin (MULTIVITAMIN) tablet Take 1 tablet by mouth daily.  ? Omega-3 Fatty Acids (FISH OIL) 1200 MG CAPS Take by mouth daily at 8 pm.  ? [DISCONTINUED] aspirin EC 81 MG tablet Take 1 tablet (81 mg total) by mouth daily.  ? [DISCONTINUED] atorvastatin (LIPITOR) 20 MG tablet Take 1 tablet (20 mg total) by mouth daily at 6 PM.  ? [DISCONTINUED] ferrous sulfate (FEROSUL) 325 (65 FE) MG tablet Take 1 tablet (325 mg total) by mouth daily with breakfast. With vitamin C 500 mg daily  ? [DISCONTINUED] loratadine (CLARITIN) 10 MG tablet Take 1 tablet (10 mg total) by mouth daily.  ? [DISCONTINUED] losartan (COZAAR) 25 MG tablet Take 0.5 tablets (12.5 mg total) by mouth daily. Dc norvasc 2.5 mg qd  ? ?Allergies  ?Allergen Reactions  ? Bee Venom Anaphylaxis and Hives  ? Codeine Other (See Comments)  ?  Altered mental status ?Altered mental status  ? Morphine And Related   ?  Altered mental status  ? Shellfish Allergy Anaphylaxis and Hives  ?  Mainly shrimp  ? Bactrim [Sulfamethoxazole-Trimethoprim]   ?  ? Reaction  ?  ? Norvasc [Amlodipine]   ?  2.5 leg edema ?  ? Nitrofurantoin Rash  ? ?Recent Results (from the past 2160 hour(s))  ?CBC with Differential     Status: Abnormal  ? Collection Time: 05/08/21  6:54 AM  ?Result Value Ref Range  ? WBC 6.7 4.0 - 10.5 K/uL  ? RBC 5.13 (H) 3.87 - 5.11 MIL/uL   ? Hemoglobin 15.9 (H) 12.0 - 15.0 g/dL  ? HCT 48.1 (H) 36.0 - 46.0 %  ? MCV 93.8 80.0 - 100.0 fL  ? MCH 31.0 26.0 - 34.0 pg  ? MCHC 33.1 30.0 - 36.0 g/dL  ? RDW 12.6 11.5 - 15.5 %  ? Platelets 191 150 - 400 K/uL  ?  nRBC 0.0 0.0 - 0.2 %  ? Neutrophils Relative % 52 %  ? Neutro Abs 3.5 1.7 - 7.7 K/uL  ? Lymphocytes Relative 29 %  ? Lymphs Abs 1.9 0.7 - 4.0 K/uL  ? Monocytes Relative 10 %  ? Monocytes Absolute 0.7 0.1 - 1.0 K/uL  ? Eosinophils Relative 8 %  ? Eosinophils Absolute 0.6 (H) 0.0 - 0.5 K/uL  ? Basophils Relative 1 %  ? Basophils Absolute 0.1 0.0 - 0.1 K/uL  ? Immature Granulocytes 0 %  ? Abs Immature Granulocytes 0.01 0.00 - 0.07 K/uL  ?  Comment: Performed at [facility], [address], [place], [place] [address]  ?Comprehensive metabolic panel     Status: None  ? Collection Time: 05/08/21  6:54 AM  ?Result Value Ref Range  ? Sodium 137 135 - 145 mmol/L  ? Potassium 3.9 3.5 - 5.1 mmol/L  ? Chloride 104 98 - 111 mmol/L  ? CO2 26 22 - 32 mmol/L  ? Glucose, Bld 91 70 - 99 mg/dL  ?  Comment: Glucose reference range applies only to samples taken after fasting for at least 8 hours.  ? BUN 17 8 - 23 mg/dL  ? Creatinine, Ser 0.86 0.44 - 1.00 mg/dL  ? Calcium 9.2 8.9 - 10.3 mg/dL  ? Total Protein 7.9 6.5 - 8.1 g/dL  ? Albumin 3.9 3.5 - 5.0 g/dL  ? AST 29 15 - 41 U/L  ? ALT 25 0 - 44 U/L  ? Alkaline Phosphatase 97 38 - 126 U/L  ? Total Bilirubin 0.8 0.3 - 1.2 mg/dL  ? GFR, Estimated >60 >60 mL/min  ?  Comment: (NOTE) ?Calculated using the CKD-EPI Creatinine Equation (2021) ?  ? Anion gap 7 5 - 15  ?  Comment: Performed at [facility], [address] [place], [place] [address]  ?Troponin I (High Sensitivity)     Status: None  ? Collection Time: 05/08/21  6:54 AM  ?Result Value Ref Range  ? Troponin I (High Sensitivity) 6 <18 ng/L  ?  Comment: (NOTE) ?Elevated high sensitivity troponin I (hsTnI) values and significant  ?changes across serial measurements may suggest ACS but many other   ?chronic and acute conditions are known to elevate hsTnI results.  ?Refer to the "Links" section for chest pain algorithms and additional  ?guidance. ?Performed at [facility], [place],

## 2021-05-17 ENCOUNTER — Ambulatory Visit (INDEPENDENT_AMBULATORY_CARE_PROVIDER_SITE_OTHER): Payer: Medicare Other | Admitting: Dermatology

## 2021-05-17 DIAGNOSIS — D492 Neoplasm of unspecified behavior of bone, soft tissue, and skin: Secondary | ICD-10-CM

## 2021-05-17 DIAGNOSIS — L578 Other skin changes due to chronic exposure to nonionizing radiation: Secondary | ICD-10-CM

## 2021-05-17 DIAGNOSIS — L570 Actinic keratosis: Secondary | ICD-10-CM | POA: Diagnosis not present

## 2021-05-17 DIAGNOSIS — L820 Inflamed seborrheic keratosis: Secondary | ICD-10-CM | POA: Diagnosis not present

## 2021-05-17 DIAGNOSIS — C4491 Basal cell carcinoma of skin, unspecified: Secondary | ICD-10-CM

## 2021-05-17 DIAGNOSIS — C44519 Basal cell carcinoma of skin of other part of trunk: Secondary | ICD-10-CM | POA: Diagnosis not present

## 2021-05-17 HISTORY — DX: Basal cell carcinoma of skin, unspecified: C44.91

## 2021-05-17 NOTE — Progress Notes (Signed)
Follow-Up Visit   Subjective  Wanda Nelson is a 88 y.o. female who presents for the following: Follow-up (Pt here to a spot on her back biopsied. ). The patient has spots, moles and lesions to be evaluated, some may be new or changing and the patient has concerns that these could be cancer.   The following portions of the chart were reviewed this encounter and updated as appropriate:   Tobacco  Allergies  Meds  Problems  Med Hx  Surg Hx  Fam Hx     Review of Systems:  No other skin or systemic complaints except as noted in HPI or Assessment and Plan.  Objective  Well appearing patient in no apparent distress; mood and affect are within normal limits.  A focused examination was performed including face,back. Relevant physical exam findings are noted in the Assessment and Plan.  Left mid back x 1 Stuck-on, waxy, tan-brown papules  left upper back 5cm lat to spine 1.5 cm crusted pink papule        Nose x 1 Erythematous thin papules/macules with gritty scale.    Assessment & Plan  Inflamed seborrheic keratosis Left mid back x 1  Destruction of lesion - Left mid back x 1 Complexity: simple   Destruction method: cryotherapy   Informed consent: discussed and consent obtained   Timeout:  patient name, date of birth, surgical site, and procedure verified Lesion destroyed using liquid nitrogen: Yes   Region frozen until ice ball extended beyond lesion: Yes   Outcome: patient tolerated procedure well with no complications   Post-procedure details: wound care instructions given    Neoplasm of skin left upper back 5cm lat to spine  Epidermal / dermal shaving  Lesion diameter (cm):  1.5 Informed consent: discussed and consent obtained   Timeout: patient name, date of birth, surgical site, and procedure verified   Procedure prep:  Patient was prepped and draped in usual sterile fashion Prep type:  Isopropyl alcohol Anesthesia: the lesion was anesthetized in  a standard fashion   Anesthetic:  1% lidocaine w/ epinephrine 1-100,000 buffered w/ 8.4% NaHCO3 Hemostasis achieved with: pressure, aluminum chloride and electrodesiccation   Outcome: patient tolerated procedure well   Post-procedure details: sterile dressing applied and wound care instructions given   Dressing type: bandage and petrolatum    Destruction of lesion  Destruction method: electrodesiccation and curettage   Informed consent: discussed and consent obtained   Timeout:  patient name, date of birth, surgical site, and procedure verified Anesthesia: the lesion was anesthetized in a standard fashion   Anesthetic:  1% lidocaine w/ epinephrine 1-100,000 buffered w/ 8.4% NaHCO3 Curettage performed in three different directions: Yes   Electrodesiccation performed over the curetted area: Yes   Curettage cycles:  3 Final wound size (cm):  2.1 Hemostasis achieved with:  electrodesiccation Outcome: patient tolerated procedure well with no complications   Post-procedure details: sterile dressing applied and wound care instructions given   Dressing type: petrolatum    Specimen 1 - Surgical pathology Differential Diagnosis:R/O BCC  Check Margins: No  AK (actinic keratosis) Nose x 1  Actinic keratoses are precancerous spots that appear secondary to cumulative UV radiation exposure/sun exposure over time. They are chronic with expected duration over 1 year. A portion of actinic keratoses will progress to squamous cell carcinoma of the skin. It is not possible to reliably predict which spots will progress to skin cancer and so treatment is recommended to prevent development of skin cancer.  Recommend  daily broad spectrum sunscreen SPF 30+ to sun-exposed areas, reapply every 2 hours as needed.  Recommend staying in the shade or wearing long sleeves, sun glasses (UVA+UVB protection) and wide brim hats (4-inch brim around the entire circumference of the hat). Call for new or changing lesions.    Destruction of lesion - Nose x 1 Complexity: simple   Destruction method: cryotherapy   Informed consent: discussed and consent obtained   Timeout:  patient name, date of birth, surgical site, and procedure verified Lesion destroyed using liquid nitrogen: Yes   Region frozen until ice ball extended beyond lesion: Yes   Outcome: patient tolerated procedure well with no complications   Post-procedure details: wound care instructions given    Actinic Damage - chronic, secondary to cumulative UV radiation exposure/sun exposure over time - diffuse scaly erythematous macules with underlying dyspigmentation - Recommend daily broad spectrum sunscreen SPF 30+ to sun-exposed areas, reapply every 2 hours as needed.  - Recommend staying in the shade or wearing long sleeves, sun glasses (UVA+UVB protection) and wide brim hats (4-inch brim around the entire circumference of the hat). - Call for new or changing lesions.  Return in about 8 months (around 01/17/2022) for Aks, ISKs.  IMarye Round, CMA, am acting as scribe for Sarina Ser, MD .  Documentation: I have reviewed the above documentation for accuracy and completeness, and I agree with the above.  Sarina Ser, MD

## 2021-05-17 NOTE — Patient Instructions (Addendum)
Cryotherapy Aftercare  Wash gently with soap and water everyday.   Apply Vaseline and Band-Aid daily until healed.    Wound Care Instructions  Cleanse wound gently with soap and water once a day then pat dry with clean gauze. Apply a thing coat of Petrolatum (petroleum jelly, "Vaseline") over the wound (unless you have an allergy to this). We recommend that you use a new, sterile tube of Vaseline. Do not pick or remove scabs. Do not remove the yellow or white "healing tissue" from the base of the wound.  Cover the wound with fresh, clean, nonstick gauze and secure with paper tape. You may use Band-Aids in place of gauze and tape if the would is small enough, but would recommend trimming much of the tape off as there is often too much. Sometimes Band-Aids can irritate the skin.  You should call the office for your biopsy report after 1 week if you have not already been contacted.  If you experience any problems, such as abnormal amounts of bleeding, swelling, significant bruising, significant pain, or evidence of infection, please call the office immediately.  FOR ADULT SURGERY PATIENTS: If you need something for pain relief you may take 1 extra strength Tylenol (acetaminophen) AND 2 Ibuprofen (200mg each) together every 4 hours as needed for pain. (do not take these if you are allergic to them or if you have a reason you should not take them.) Typically, you may only need pain medication for 1 to 3 days.        If You Need Anything After Your Visit  If you have any questions or concerns for your doctor, please call our main line at [phone] and press option 4 to reach your doctor's medical assistant. If no one answers, please leave a voicemail as directed and we will return your call as soon as possible. Messages left after 4 pm will be answered the following business day.   You may also send us a message via MyChart. We typically respond to MyChart messages within 1-2 business  days.  For prescription refills, please ask your pharmacy to contact our office. Our fax number is [phone].  If you have an urgent issue when the clinic is closed that cannot wait until the next business day, you can page your doctor at the number below.    Please note that while we do our best to be available for urgent issues outside of office hours, we are not available 24/7.   If you have an urgent issue and are unable to reach us, you may choose to seek medical care at your doctor's office, retail clinic, urgent care center, or emergency room.  If you have a medical emergency, please immediately call 911 or go to the emergency department.  Pager Numbers  - Dr. Kowalski: [phone]  - Dr. Moye: [phone]  - Dr. Stewart: [phone]  In the event of inclement weather, please call our main line at [phone] for an update on the status of any delays or closures.  Dermatology Medication Tips: Please keep the boxes that topical medications come in in order to help keep track of the instructions about where and how to use these. Pharmacies typically print the medication instructions only on the boxes and not directly on the medication tubes.   If your medication is too expensive, please contact our office at [phone] option 4 or send us a message through MyChart.   We are unable to tell what your co-pay for medications will be   in advance as this is different depending on your insurance coverage. However, we may be able to find a substitute medication at lower cost or fill out paperwork to get insurance to cover a needed medication.   If a prior authorization is required to get your medication covered by your insurance company, please allow us 1-2 business days to complete this process.  Drug prices often vary depending on where the prescription is filled and some pharmacies may offer cheaper prices.  The website www.goodrx.com contains coupons for medications through  different pharmacies. The prices here do not account for what the cost may be with help from insurance (it may be cheaper with your insurance), but the website can give you the price if you did not use any insurance.  - You can print the associated coupon and take it with your prescription to the pharmacy.  - You may also stop by our office during regular business hours and pick up a GoodRx coupon card.  - If you need your prescription sent electronically to a different pharmacy, notify our office through [facility] MyChart or by phone at [phone] option 4.     Si Usted Necesita Algo Despus de Su Visita  Tambin puede enviarnos un mensaje a travs de MyChart. Por lo general respondemos a los mensajes de MyChart en el transcurso de 1 a 2 das hbiles.  Para renovar recetas, por favor pida a su farmacia que se ponga en contacto con nuestra oficina. Nuestro nmero de fax es el [phone].  Si tiene un asunto urgente cuando la clnica est cerrada y que no puede esperar hasta el siguiente da hbil, puede llamar/localizar a su doctor(a) al nmero que aparece a continuacin.   Por favor, tenga en cuenta que aunque hacemos todo lo posible para estar disponibles para asuntos urgentes fuera del horario de oficina, no estamos disponibles las 24 horas del da, los 7 das de la semana.   Si tiene un problema urgente y no puede comunicarse con nosotros, puede optar por buscar atencin mdica  en el consultorio de su doctor(a), en una clnica privada, en un centro de atencin urgente o en una sala de emergencias.  Si tiene una emergencia mdica, por favor llame inmediatamente al 911 o vaya a la sala de emergencias.  Nmeros de bper  - Dr. Kowalski: [phone]  - Dra. Moye: [phone]  - Dra. Stewart: [phone]  En caso de inclemencias del tiempo, por favor llame a nuestra lnea principal al [phone] para una actualizacin sobre el estado de cualquier retraso o cierre.  Consejos  para la medicacin en dermatologa: Por favor, guarde las cajas en las que vienen los medicamentos de uso tpico para ayudarle a seguir las instrucciones sobre dnde y cmo usarlos. Las farmacias generalmente imprimen las instrucciones del medicamento slo en las cajas y no directamente en los tubos del medicamento.   Si su medicamento es muy caro, por favor, pngase en contacto con nuestra oficina llamando al [phone] y presione la opcin 4 o envenos un mensaje a travs de MyChart.   No podemos decirle cul ser su copago por los medicamentos por adelantado ya que esto es diferente dependiendo de la cobertura de su seguro. Sin embargo, es posible que podamos encontrar un medicamento sustituto a menor costo o llenar un formulario para que el seguro cubra el medicamento que se considera necesario.   Si se requiere una autorizacin previa para que su compaa de seguros cubra su medicamento, por favor permtanos de 1 a   2 das hbiles para completar este proceso.  Los precios de los medicamentos varan con frecuencia dependiendo del lugar de dnde se surte la receta y alguna farmacias pueden ofrecer precios ms baratos.  El sitio web www.goodrx.com tiene cupones para medicamentos de diferentes farmacias. Los precios aqu no tienen en cuenta lo que podra costar con la ayuda del seguro (puede ser ms barato con su seguro), pero el sitio web puede darle el precio si no utiliz ningn seguro.  - Puede imprimir el cupn correspondiente y llevarlo con su receta a la farmacia.  - Tambin puede pasar por nuestra oficina durante el horario de atencin regular y recoger una tarjeta de cupones de GoodRx.  - Si necesita que su receta se enve electrnicamente a una farmacia diferente, informe a nuestra oficina a travs de MyChart de [place] o por telfono llamando al [phone] y presione la opcin 4.  

## 2021-05-23 ENCOUNTER — Telehealth: Payer: Self-pay

## 2021-05-23 NOTE — Telephone Encounter (Signed)
Patient informed of pathology results 

## 2021-05-23 NOTE — Telephone Encounter (Signed)
-----   Message from Ralene Bathe, MD sent at 05/22/2021  6:14 PM EDT ----- ?Diagnosis ?Skin , left upper back 5cm lat to spine ?BASAL CELL CARCINOMA WITH FOCAL SCLEROSIS, ULCERATED, SEE DESCRIPTION ? ?Cancer - BCC ?Already treated ?Recheck next visit ?

## 2021-06-02 ENCOUNTER — Encounter: Payer: Self-pay | Admitting: Dermatology

## 2021-06-06 DIAGNOSIS — Z23 Encounter for immunization: Secondary | ICD-10-CM | POA: Diagnosis not present

## 2021-06-27 DIAGNOSIS — H401112 Primary open-angle glaucoma, right eye, moderate stage: Secondary | ICD-10-CM | POA: Diagnosis not present

## 2021-06-27 DIAGNOSIS — Z961 Presence of intraocular lens: Secondary | ICD-10-CM | POA: Diagnosis not present

## 2021-06-27 DIAGNOSIS — H0288A Meibomian gland dysfunction right eye, upper and lower eyelids: Secondary | ICD-10-CM | POA: Diagnosis not present

## 2021-06-27 DIAGNOSIS — H401123 Primary open-angle glaucoma, left eye, severe stage: Secondary | ICD-10-CM | POA: Diagnosis not present

## 2021-06-27 DIAGNOSIS — H0288B Meibomian gland dysfunction left eye, upper and lower eyelids: Secondary | ICD-10-CM | POA: Diagnosis not present

## 2021-06-27 DIAGNOSIS — H16223 Keratoconjunctivitis sicca, not specified as Sjogren's, bilateral: Secondary | ICD-10-CM | POA: Diagnosis not present

## 2021-06-28 DIAGNOSIS — R0602 Shortness of breath: Secondary | ICD-10-CM | POA: Diagnosis not present

## 2021-06-28 DIAGNOSIS — K219 Gastro-esophageal reflux disease without esophagitis: Secondary | ICD-10-CM | POA: Diagnosis not present

## 2021-06-28 DIAGNOSIS — K449 Diaphragmatic hernia without obstruction or gangrene: Secondary | ICD-10-CM | POA: Diagnosis not present

## 2021-06-29 ENCOUNTER — Ambulatory Visit (INDEPENDENT_AMBULATORY_CARE_PROVIDER_SITE_OTHER): Payer: Medicare Other | Admitting: Podiatry

## 2021-06-29 ENCOUNTER — Encounter: Payer: Self-pay | Admitting: Internal Medicine

## 2021-06-29 ENCOUNTER — Other Ambulatory Visit: Payer: Self-pay | Admitting: Family

## 2021-06-29 ENCOUNTER — Encounter: Payer: Self-pay | Admitting: Podiatry

## 2021-06-29 DIAGNOSIS — B351 Tinea unguium: Secondary | ICD-10-CM

## 2021-06-29 DIAGNOSIS — M79674 Pain in right toe(s): Secondary | ICD-10-CM

## 2021-06-29 DIAGNOSIS — M79675 Pain in left toe(s): Secondary | ICD-10-CM

## 2021-06-29 DIAGNOSIS — K449 Diaphragmatic hernia without obstruction or gangrene: Secondary | ICD-10-CM

## 2021-06-29 NOTE — Progress Notes (Signed)

## 2021-07-04 ENCOUNTER — Encounter: Payer: Self-pay | Admitting: Gastroenterology

## 2021-07-07 NOTE — Addendum Note (Signed)
Addended by: Orland Mustard on: 07/07/2021 12:43 PM   Modules accepted: Orders

## 2021-07-17 ENCOUNTER — Encounter: Payer: Medicare Other | Admitting: Gastroenterology

## 2021-07-19 ENCOUNTER — Ambulatory Visit (INDEPENDENT_AMBULATORY_CARE_PROVIDER_SITE_OTHER): Payer: Medicare Other | Admitting: Surgery

## 2021-07-19 ENCOUNTER — Encounter: Payer: Self-pay | Admitting: Surgery

## 2021-07-19 ENCOUNTER — Other Ambulatory Visit: Payer: Self-pay

## 2021-07-19 ENCOUNTER — Telehealth: Payer: Self-pay | Admitting: Cardiovascular Disease

## 2021-07-19 VITALS — BP 124/74 | HR 73 | Temp 97.4°F | Ht 61.0 in | Wt 117.0 lb

## 2021-07-19 DIAGNOSIS — K449 Diaphragmatic hernia without obstruction or gangrene: Secondary | ICD-10-CM | POA: Diagnosis not present

## 2021-07-19 NOTE — Telephone Encounter (Signed)
   Pre-operative Risk Assessment    Patient Name: Wanda Nelson  DOB: Mar 21, 1931 MRN: [id number]      Request for Surgical Clearance    Procedure:   Hiatal Hernia Repair   Date of Surgery:  Clearance TBD                                 Surgeon:  Dr Caroleen Hamman Surgeon's Group or Practice Name:  [facility] Phone number:  [phone] Fax number:  [phone]   Type of Clearance Requested:   - Medical    Type of Anesthesia:  Not Indicated   Additional requests/questions:    Manfred Arch   07/19/2021, 4:00 PM

## 2021-07-19 NOTE — Patient Instructions (Addendum)
I have placed a call to [facility] to see if we can have your EGD done sooner than November 9th. Please listen out for a phone call from their office.     Barium swallow scheduled  on 08/03/21 @ 8:30 am @ [facility]. Nothing to eat/drink 4 hours prior.   Medical Clearance faxed to Dr. Letitia Libra .  Cardiology Clearance faxed to Dr.Christopher End.   Hiatal Hernia  A hiatal hernia occurs when part of the stomach slides above the muscle that separates the abdomen from the chest (diaphragm). A person can be born with a hiatal hernia (congenital), or it may develop over time. In almost all cases of hiatal hernia, only the top part of the stomach pushes through the diaphragm. Many people have a hiatal hernia with no symptoms. The larger the hernia, the more likely it is that you will have symptoms. In some cases, a hiatal hernia allows stomach acid to flow back into the tube that carries food from your mouth to your stomach (esophagus). This may cause heartburn symptoms. Severe heartburn symptoms may mean that you have developed a condition called gastroesophageal reflux disease (GERD). What are the causes? This condition is caused by a weakness in the opening (hiatus) where the esophagus passes through the diaphragm to attach to the upper part of the stomach. A person may be born with a weakness in the hiatus, or a weakness can develop over time. What increases the risk? This condition is more likely to develop in: Older people. Age is a major risk factor for a hiatal hernia, especially if you are over the age of 92. Pregnant women. People who are overweight. People who have frequent constipation. What are the signs or symptoms? Symptoms of this condition usually develop in the form of GERD symptoms. Symptoms include: Heartburn. Belching. Indigestion. Trouble swallowing. Coughing or wheezing. Sore throat. Hoarseness. Chest pain. Nausea and vomiting. How is this diagnosed? This condition may be  diagnosed during testing for GERD. Tests that may be done include: X-rays of your stomach or chest. An upper gastrointestinal (GI) series. This is an X-ray exam of your GI tract that is taken after you swallow a chalky liquid that shows up clearly on the X-ray. Endoscopy. This is a procedure to look into your stomach using a thin, flexible tube that has a tiny camera and light on the end of it. How is this treated? This condition may be treated by: Dietary and lifestyle changes to help reduce GERD symptoms. Medicines. These may include: Over-the-counter antacids. Medicines that make your stomach empty more quickly. Medicines that block the production of stomach acid (H2 blockers). Stronger medicines to reduce stomach acid (proton pump inhibitors). Surgery to repair the hernia, if other treatments are not helping. If you have no symptoms, you may not need treatment. Follow these instructions at home: Lifestyle and activity Do not use any products that contain nicotine or tobacco, such as cigarettes and e-cigarettes. If you need help quitting, ask your health care provider. Try to achieve and maintain a healthy body weight. Avoid putting pressure on your abdomen. Anything that puts pressure on your abdomen increases the amount of acid that may be pushed up into your esophagus. Avoid bending over, especially after eating. Raise the head of your bed by putting blocks under the legs. This keeps your head and esophagus higher than your stomach. Do not wear tight clothing around your chest or stomach. Try not to strain when having a bowel movement, when urinating, or  when lifting heavy objects. Eating and drinking Avoid foods that can worsen GERD symptoms. These may include: Fatty foods, like fried foods. Citrus fruits, like oranges or lemon. Other foods and drinks that contain acid, like orange juice or tomatoes. Spicy food. Chocolate. Eat frequent small meals instead of three large meals a  day. This helps prevent your stomach from getting too full. Eat slowly. Do not lie down right after eating. Do not eat 1-2 hours before bed. Do not drink beverages with caffeine. These include cola, coffee, cocoa, and tea. Do not drink alcohol. General instructions Take over-the-counter and prescription medicines only as told by your health care provider. Keep all follow-up visits as told by your health care provider. This is important. Contact a health care provider if: Your symptoms are not controlled with medicines or lifestyle changes. You are having trouble swallowing. You have coughing or wheezing that will not go away. Get help right away if: Your pain is getting worse. Your pain spreads to your arms, neck, jaw, teeth, or back. You have shortness of breath. You sweat for no reason. You feel sick to your stomach (nauseous) or you vomit. You vomit blood. You have bright red blood in your stools. You have black, tarry stools. Summary A hiatal hernia occurs when part of the stomach slides above the muscle that separates the abdomen from the chest (diaphragm). A person may be born with a weakness in the hiatus, or a weakness can develop over time. Symptoms of hiatal hernia may include heartburn, trouble swallowing, or sore throat. Management of hiatal hernia includes eating frequent small meals instead of three large meals a day. Get help right away if you vomit blood, have bright red blood in your stools, or have black, tarry stools. This information is not intended to replace advice given to you by your health care provider. Make sure you discuss any questions you have with your health care provider. Document Revised: 11/08/2020 Document Reviewed: 11/26/2019 Elsevier Patient Education  [place] After Nissen Fundoplication After a Nissen fundoplication procedure, it is common to have some difficulty swallowing. The part of your body that moves food and liquid  from your mouth to your stomach (esophagus) will be swollen and may feel tight. It will take several weeks or months for your esophagus and stomach to heal. By following a special eating plan, you can prevent problems such as pain, swelling or pressure in the abdomen (bloating), gas, nausea, or diarrhea. What are tips for following this plan? Cooking Cook all foods until they are soft. Remove skins and seeds from fruits and vegetables before eating. Remove skin and gristle from meats. Grind or finely mince meats before eating. Avoid over-cooking meat. Dry, tough meat is more difficult to swallow. Avoid using oil when cooking, or use only a small amount of oil. Avoid using seasoning when cooking, or use only a small amount of seasoning. Toast bread before eating. This makes it easier to swallow. Meal planning  Eat 6-8 small meals throughout the day. Right after the surgery, have a few meals that are only clear liquids. Clear liquids include: Water. Clear fruit juice, no pulp. Chicken, beef, or vegetable broth. Gelatin. Decaffeinated tea or coffee without milk. Popsicles or shaved ice. Depending on your progress, you may move to a full liquid diet as told by your health care provider. This includes clear liquids and the following: Dairy and alternative milks, such as soy milk. Strained creamed soups. Ice cream or sherbet. Pudding.  Nutritional supplement drinks. Yogurt. A few days after surgery, you may be able to start eating a diet of soft foods. You may need to eat according to this plan for several weeks. Do not eat sweets or sweetened drinks at the beginning of a meal. Doing that may cause your stomach to empty faster than it should (dumping syndrome). Lifestyle Always sit upright when eating or drinking. Eat slowly. Take small bites and chew food well before swallowing. Do not lie down after eating. Stay sitting up for 30 minutes or longer after each meal. Sip fluids between  meals. Limit how much you drink at one time. With meals and snacks, have 4-8 oz (120-240 mL). This is equal to  cup-1 cup. Do not mix solid foods and liquids in the same mouthful. Drink enough fluid to keep your urine pale yellow. Do not chew gum or drink fluids through a straw. Doing those things may cause you to swallow extra air. General information Do not drink carbonated drinks or alcohol. Avoid foods and drinks that contain caffeine and chocolate. Avoid foods and drinks that contain citrus or tomato. Allow hot soups and drinks to cool before eating. Avoid foods that cause gas, such as beans, peas, broccoli, or cabbage. If dairy milk products cause diarrhea, avoid them or eat them in small amounts. Recommended foods Fruits Any soft-cooked fruits after skins and seeds are removed. Fruit juice. Vegetables Any soft-cooked vegetables after skins and seeds are removed. Vegetable juice. Grains Cooked cereals. Dry cereals softened with liquid. Cooked pasta, rice, or other grains. Toasted bread. Bland crackers, such as soda or graham crackers. Meats and other protein foods Tender cuts of meat, poultry, or fish after bones, skin, and gristle are removed. Poached, boiled, or scrambled eggs. Canned fish. Tofu. Creamy nut butters. Dairy Milk. Yogurt. Cottage cheese. Mild cheeses. Beverages Nutritional supplement drinks. Decaffeinated tea or coffee. Sports drinks. Fats and oils Butter. Margarine. Mayonnaise. Vegetable oil. Smooth salad dressing. Sweets and desserts Plain hard candy. Marshmallows. Pudding. Ice cream. Gelatin. Sherbet. Seasoning and other foods Salt. Light seasonings. Mustard. Vinegar. The items listed above may not be a complete list of recommended foods and beverages. Contact a dietitian for more information. Foods to avoid Fruits Oranges. Grapefruit. Lemons. Limes. Citrus juices. Dried fruit. Crunchy, raw fruits. Vegetables Tomato sauce. Tomato juice. Broccoli.  Cauliflower. Cabbage. Brussels sprouts. Crunchy, raw vegetables. Grains High-fiber or bran cereal. Cereal with nuts, dried fruit, or coconut. Sweet breads, rolls, coffee cake, or donuts. Chewy or crusty breads. Popcorn. Meats and other protein foods Beans, peas, and lentils. Tough or fatty meats. Fried meats, chicken, or fish. Fried eggs. Nuts and seeds. Crunchy nut butters. Dairy Chocolate milk. Yogurt with chunks of fruit, nuts, seeds, or coconut. Strong cheeses. Beverages Carbonated soft drinks. Alcohol. Cocoa. Hot drinks. Fats and oils Bacon fat. Lard. Sweets and desserts Chocolate. Candy with nuts, coconut, or seeds. Peppermint. Cookies. Cakes. Pie crust. Seasoning and other foods Heavy seasonings. Chili sauce. Ketchup. Barbecue sauce. Angie Fava. Horseradish. The items listed above may not be a complete list of foods and beverages to avoid. Contact a dietitian for more information. Summary Following this eating plan after a Nissen fundoplication is an important part of healing after surgery. After surgery, you will start with a clear liquid diet before you progress to full liquids and soft foods. You may need to eat soft foods for several weeks. Avoid eating foods that cause irritation, gas, nausea, diarrhea, or swelling or pressure in the abdomen (bloating), and avoid foods  that are difficult to swallow. Talk with a dietitian about which dietary choices are best for you. This information is not intended to replace advice given to you by your health care provider. Make sure you discuss any questions you have with your health care provider. Document Revised: 07/12/2019 Document Reviewed: 07/12/2019 Elsevier Patient Education  [place].

## 2021-07-20 ENCOUNTER — Other Ambulatory Visit: Payer: Medicare Other

## 2021-07-20 NOTE — Telephone Encounter (Signed)
   Name: Wanda Nelson  DOB: 1931/10/10  MRN: [id number]  Primary Cardiologist: [name], MD  Chart reviewed as part of pre-operative protocol coverage. Because of [name]'s past medical history and time since last visit, she will require a follow-up in-office visit in order to better assess preoperative cardiovascular risk.  Pre-op covering staff: - Please schedule appointment and call patient to inform them. If patient already had an upcoming appointment within acceptable timeframe, please add "pre-op clearance" to the appointment notes so provider is aware. - Please contact requesting surgeon's office via preferred method (i.e, phone, fax) to inform them of need for appointment prior to surgery.  No medication holds are indicated.  Elgie Collard, PA-C  07/20/2021, 5:15 PM

## 2021-07-20 NOTE — Telephone Encounter (Signed)
Pt has appt with Dr. Fletcher Anon 08/10/21. Will add pre op clearance to appt notes.

## 2021-07-21 ENCOUNTER — Telehealth: Payer: Self-pay

## 2021-07-21 NOTE — Telephone Encounter (Signed)
Received medical clearance from Dr. Lanney Gins. Pt's risk assessment is low and is optimized for surgery.

## 2021-07-22 ENCOUNTER — Encounter: Payer: Self-pay | Admitting: Surgery

## 2021-07-22 NOTE — Progress Notes (Signed)
Surgical Consultation  07/22/2021  Wanda Nelson is an 88 y.o. female.   Chief Complaint  Patient presents with   New Patient (Initial Visit)    Hiatal hernia     HPI: Wanda Nelson is an 88 year old very nice lady seen in consultation at the request of Dr.Aleskerov for a very large paraesophageal hernia that is causing significant symptoms and interfering with her quality of life daily.  Please note that her mind is pristine but she is debilitated and fragile.  She experiences significant reflux and significant bloating as well as chest pain related to the paraesophageal hernia.  She states that she has easy satiety no significant dysphagia.  She did have a CT scan of the chest to rule out PE that I have personally reviewed there is evidence of very large paraesophageal hernia with the entire stomach within the mediastinum.  There is also evidence of organoaxial rotation.  There is some chronic pulmonary issues.  She also has cough and is very miserable related to the paraesophageal hernia.  She is here with both the son and the daughter-in-law who is a Marine scientist.  I was extremely candid today regarding expectations.  He is very functional from [place] but she is still 85.  She walks and more recently because of the hernia her quality of life has had significant impairment.  She was also very conscious about a potential operation and she said she could not live with this hernia and she was okay if she will not make it after surgical procedure.  SHe had a sternotomy for atrial myxoma and was able to tolerate sternotomy and cardiac bypass. INitially it was started w a right thoracotomy and minimally invasive cardiac approach and related to bleeding complications she was converted to open sternotomy w caridiac bypass. She did surprisingly well. Past Medical History:  Diagnosis Date   Actinic keratosis    Anemia    Basal cell carcinoma 05/17/2021   L upper back 5.0 cm lat to spine -  ED&C   COVID-19    05/30/20 had covid 19 pill    Glaucoma    History of blood transfusion    History of hiatal hernia    Hyperlipidemia    Incidental pulmonary nodule, > 62m and < 827m03/12/2018   Noted on CT scan - needs f/u imaging 6 months   s/p resection left atrial myxoma 03/21/2018   Squamous cell carcinoma of skin 11/12/2019   L distal dorsum forearm [facility]    [facility]    Past [facility] History:  Procedure Laterality Date   ABDOMINAL HYSTERECTOMY  1982   total   BUNIONECTOMY Bilateral    CLIPPING OF ATRIAL APPENDAGE  03/21/2018   Procedure: Clipping Of Atrial Appendage - using an AtriCure 4560mlip;  Surgeon: OweRexene AlbertsD;  [facility];  Service: Open Heart Surgery;;   EXCISION OF ATRIAL MYXOMA N/A 03/21/2018   Procedure: Excision Of Atrial Myxoma;  Surgeon: OweRexene AlbertsD;  [facility];  Laterality: N/A;   RECTOCELE REPAIR     RIGHT/LEFT HEART CATH AND CORONARY ANGIOGRAPHY N/A 03/19/2018   Procedure: RIGHT/LEFT HEART CATH AND CORONARY ANGIOGRAPHY;  Surgeon: AriWellington HampshireD;  [facility];  Service: Cardiovascular;  Laterality: N/A;   TEE WITHOUT CARDIOVERSION N/A 03/21/2018   Procedure: TRANSESOPHAGEAL ECHOCARDIOGRAM (TEE);  Surgeon: OweRexene AlbertsD;  [facility];  Laterality: N/A;  TONSILLECTOMY      Family History  Problem Relation Age of Onset   Colon cancer Brother    Cancer Brother        neck tumor cancerous    Esophageal varices Brother    Colon cancer Maternal Uncle    Colon cancer Maternal Grandmother    Kidney disease Brother    Coronary artery disease Brother        with stent, cabg   Breast cancer Neg Hx     Social History:  reports that she has never smoked. She has never used smokeless tobacco. She reports that she does not drink alcohol and does not use drugs.  Allergies:  Allergies  Allergen Reactions   Bee Venom Anaphylaxis and  Hives   Codeine Other (See Comments)    Altered mental status Altered mental status   Morphine And Related     Altered mental status   Shellfish Allergy Anaphylaxis and Hives    Mainly shrimp   Bactrim [Sulfamethoxazole-Trimethoprim]     ? Reaction     Norvasc [Amlodipine]     2.5 leg edema    Nitrofurantoin Rash    Medications reviewed.     ROS Full ROS performed and is otherwise negative other than what is stated in the HPI    BP 124/74   Pulse 73   Temp (!) 97.4 F (36.3 C) (Oral)   Ht '5\' 1"'$  (1.549 m)   Wt 117 lb (53.1 kg)   LMP  (LMP Unknown)   SpO2 97%   BMI 22.11 kg/m   Physical Exam Vitals and nursing note reviewed. Exam conducted with a chaperone present.  Constitutional:      General: She is not in acute distress.    Appearance: Normal appearance. She is not ill-appearing, toxic-appearing or diaphoretic.  HENT:     Mouth/Throat:     Pharynx: No oropharyngeal exudate or posterior oropharyngeal erythema.  Eyes:     General: No scleral icterus.       Right eye: No discharge.        Left eye: No discharge.  Neck:     Vascular: No carotid bruit.     Comments: Significant kyphosis but no major significant range of motion issues Cardiovascular:     Rate and Rhythm: Normal rate and regular rhythm.     Heart sounds: No murmur heard.    No friction rub.  Pulmonary:     Effort: Pulmonary effort is normal. No respiratory distress.     Breath sounds: Normal breath sounds. No stridor. No wheezing or rhonchi.  Abdominal:     General: Abdomen is flat. There is no distension.     Palpations: Abdomen is soft. There is no mass.     Tenderness: There is no abdominal tenderness. There is no guarding or rebound.     Hernia: No hernia is present.  Musculoskeletal:        General: No swelling or tenderness. Normal range of motion.     Cervical back: Normal range of motion and neck supple. No rigidity or tenderness.  Lymphadenopathy:     Cervical: No cervical  adenopathy.  Skin:    General: Skin is warm and dry.     Capillary Refill: Capillary refill takes less than 2 seconds.  Neurological:     General: No focal deficit present.     Mental Status: She is alert and oriented to person, place, and time.  Psychiatric:  Mood and Affect: Mood normal.        Behavior: Behavior normal.        Thought Content: Thought content normal.        Judgment: Judgment normal.     Assessment/Plan: 88 year old female with a large paraesophageal hernia.  She is debilitated but she is extremely aware of her situation.  She wants to pursue further work-up and we will perform barium swallow as well as EGD to assess her anatomy.  With that being said I would like to revisit her 1 more time and make sure we obtain appropriate cardiac optimization.  She might be a candidate for robotic paraesophageal hernia repair.  This situation is very difficult because any potential complication could be devastated and she may not make it.  She is fully aware of the situation.  On the other hand she is miserable and her quality of life is suffering. Please note that I will see her back after she completes her work-up and I will see her once again after she returns from a trip.  Please note that I spent greater than 70 minutes in this encounter including personally reviewing imaging studies, coordination of her care, counseling the patient and performing a proper documentation  Caroleen Hamman, MD [facility]

## 2021-08-03 ENCOUNTER — Ambulatory Visit
Admission: RE | Admit: 2021-08-03 | Discharge: 2021-08-03 | Disposition: A | Discharge status: Home / Self Care | Payer: Medicare Other | Source: Ambulatory Visit | Attending: Surgery | Admitting: Surgery

## 2021-08-03 DIAGNOSIS — K449 Diaphragmatic hernia without obstruction or gangrene: Secondary | ICD-10-CM | POA: Insufficient documentation

## 2021-08-03 DIAGNOSIS — K224 Dyskinesia of esophagus: Secondary | ICD-10-CM | POA: Diagnosis not present

## 2021-08-03 DIAGNOSIS — K219 Gastro-esophageal reflux disease without esophagitis: Secondary | ICD-10-CM | POA: Diagnosis not present

## 2021-08-08 ENCOUNTER — Other Ambulatory Visit: Payer: Self-pay

## 2021-08-08 ENCOUNTER — Ambulatory Visit (INDEPENDENT_AMBULATORY_CARE_PROVIDER_SITE_OTHER): Payer: Medicare Other | Admitting: Gastroenterology

## 2021-08-08 ENCOUNTER — Encounter: Payer: Self-pay | Admitting: Gastroenterology

## 2021-08-08 VITALS — BP 196/74 | HR 61 | Temp 97.3°F | Ht 61.0 in | Wt 119.0 lb

## 2021-08-08 DIAGNOSIS — K449 Diaphragmatic hernia without obstruction or gangrene: Secondary | ICD-10-CM

## 2021-08-08 NOTE — Progress Notes (Signed)
Jonathon Bellows MD, MRCP(U.K) [address]  [place]  [place], [place] [address]  Main: [phone]  Fax: [phone]   Gastroenterology Consultation  Referring Provider:    Dr Dahlia Byes Primary Care Physician:  McLean-Scocuzza, Nino Glow, MD Primary Gastroenterologist:  Dr. Jonathon Bellows  Reason for Consultation:     EGD        HPI:   Wanda Nelson is a 88 y.o. y/o female referred for EGD by Dr Dahlia Byes .   She was seen by Dr. Dahlia Byes on 07/19/2021 to get evaluated for repair of her paraesophageal hernia.  I have been requested to perform an EGD preoperatively.  CT angiogram PE protocol in May 2023 demonstrated very large hiatal hernia with most of the stomach in the chest organoaxial volvulus noted.  Features of chronic pulmonary fibrosis also seen.  A barium swallow on 08/03/2021 showed mild narrowing of the distal esophagus and associated dysmotility possibility of stricture EGD recommended large hiatal hernia GE junction about the esophageal hiatus and inversion of the stomach within the greater curvature with 80 to 90% of stomach herniated into the chest.  No obstructive signs seen.  Hemoglobin 16.2 in May 2023.  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   Squamous cell carcinoma of skin 11/12/2019   [facility] distal dorsum forearm [facility]    [facility]    Past [facility] History:  Procedure Laterality Date   ABDOMINAL HYSTERECTOMY  1982   total   BUNIONECTOMY Bilateral    CLIPPING OF ATRIAL APPENDAGE  03/21/2018   Procedure: Clipping Of Atrial Appendage - using an AtriCure  4588mlip;  Surgeon: OweRexene AlbertsD;  [facility];  Service: Open Heart Surgery;;   EXCISION OF ATRIAL MYXOMA N/A 03/21/2018   Procedure: Excision Of Atrial Myxoma;  Surgeon: OweRexene AlbertsD;  [facility];  Laterality: N/A;   RECTOCELE REPAIR     RIGHT/LEFT HEART CATH AND CORONARY ANGIOGRAPHY N/A 03/19/2018   Procedure: RIGHT/LEFT HEART CATH AND CORONARY ANGIOGRAPHY;  Surgeon: AriWellington HampshireD;  [facility];  Service: Cardiovascular;  Laterality: N/A;   TEE WITHOUT CARDIOVERSION N/A 03/21/2018   Procedure: TRANSESOPHAGEAL ECHOCARDIOGRAM (TEE);  Surgeon: OweRexene AlbertsD;  [facility];  Laterality: N/A;   TONSILLECTOMY      Prior to Admission medications   Medication Sig Start Date End Date Taking? Authorizing Provider  acetaminophen (TYLENOL) 325 MG tablet Take 2 tablets (650 mg total) by mouth every 6 (six) hours as needed for mild pain (or Fever >/= 101). 05/09/21   AndRicharda OsmondD  Ascorbic Acid (VITAMIN C PO) Take by mouth daily at 12 noon.    [provider]  aspirin EC 81 MG tablet Take 1 tablet (81 mg total) by mouth daily. 05/16/21   McLean-Scocuzza, TraNino GlowD  atorvastatin (LIPITOR)  20 MG tablet Take 1 tablet (20 mg total) by mouth daily at 6 PM. 05/16/21   McLean-Scocuzza, Nino Glow, MD  Calcium Carbonate-Vitamin D 500-125 MG-UNIT TABS Take 1 tablet by mouth daily.    [provider]  cholecalciferol (VITAMIN D) 1000 units tablet Take 1,000 Units by mouth daily.    [provider]  ferrous sulfate (FEROSUL) 325 (65 FE) MG tablet Take 1 tablet (325 mg total) by mouth daily with breakfast. With vitamin C 500 mg daily 05/16/21   McLean-Scocuzza, Nino Glow, MD  Flaxseed, Linseed, (FLAX SEED OIL) 1300 MG CAPS Take 1,100 mg by mouth daily.     [provider]  ipratropium-albuterol (DUONEB) 0.5-2.5 (3) MG/3ML SOLN Take 3 mLs by nebulization every 6 (six)  hours as needed. 05/09/21 06/08/21  Richarda Osmond, MD  loratadine (CLARITIN) 10 MG tablet Take 1 tablet (10 mg total) by mouth daily. As needed at night 05/16/21 05/11/22  McLean-Scocuzza, Nino Glow, MD  losartan (COZAAR) 25 MG tablet Take 0.5 tablets (12.5 mg total) by mouth 2 (two) times daily. Take extra pill if BP on top >140/>80 (bottom) 05/16/21   McLean-Scocuzza, Nino Glow, MD  Multiple Vitamin (MULTIVITAMIN) tablet Take 1 tablet by mouth daily.    [provider]  Omega-3 Fatty Acids (FISH OIL) 1200 MG CAPS Take by mouth daily at 8 pm.    [provider]  pantoprazole (PROTONIX) 40 MG tablet Take 40 mg by mouth daily.    [provider]    Family History  Problem Relation Age of Onset   Colon cancer Brother    Cancer Brother        neck tumor cancerous    Esophageal varices Brother    Colon cancer Maternal Uncle    Colon cancer Maternal Grandmother    Kidney disease Brother    Coronary artery disease Brother        with stent, cabg   Breast cancer Neg Hx      Social History   Tobacco Use   Smoking status: Never   Smokeless tobacco: Never  Vaping Use   Vaping Use: Never used  Substance Use Topics   Alcohol use: No    Alcohol/week: 0.0 standard drinks of alcohol   Drug use: No    Allergies as of 08/08/2021 - Review Complete 08/08/2021  Allergen Reaction Noted   Bee venom Anaphylaxis and Hives 03/15/2015   Codeine Other (See Comments) 03/15/2015   Morphine and related  03/15/2015   Shellfish allergy Anaphylaxis and Hives 03/15/2015   Bactrim [sulfamethoxazole-trimethoprim]  06/24/2019   Norvasc [amlodipine]  07/20/2020   Nitrofurantoin Rash 03/15/2015    Review of Systems:    All systems reviewed and negative except where noted in HPI.   Physical Exam:  BP (!) 196/74   Pulse 61   Temp (!) 97.3 F (36.3 C) (Oral)   Ht '5\' 1"'$  (1.549 m)   Wt 119 lb (54 kg)   LMP  (LMP Unknown)   BMI 22.48 kg/m  No LMP recorded (lmp unknown). Patient has  had a hysterectomy. Psych:  Alert and cooperative. Normal mood and affect. General:   Alert,  Well-developed, well-nourished, pleasant and cooperative in NAD Head:  Normocephalic and atraumatic. Eyes:  Sclera clear, no icterus.   Conjunctiva pink. Ears:  Normal auditory acuity. Neurologic:  Alert and oriented x3;  grossly normal neurologically. Psych:  Alert and cooperative. Normal mood and affect.  Imaging Studies: DG ESOPHAGUS W SINGLE CM (SOL OR  THIN BA)  Result Date: 08/03/2021 CLINICAL DATA:  Patient with history of large paraesophageal hernia, severe reflux, bloating, early satiety, chest pain with eating. Request for esophagram prior to consideration for surgical repair. EXAM: ESOPHAGUS/BARIUM SWALLOW/TABLET STUDY TECHNIQUE: Single contrast examination was performed using thin liquid barium. This exam was performed by Candiss Norse, PA-C, and was supervised and interpreted by Zetta Bills, MD FLUOROSCOPY: Radiation Exposure Index (as provided by the fluoroscopic device): 31.30 mGy Kerma COMPARISON:  CTA chest 05/08/2021 and 08/31/2020, CT chest w/o contrast 03/30/2019 FINDINGS: Swallowing: Appears normal. No vestibular penetration or aspiration seen. Pharynx: Unremarkable. Esophagus: Patulous esophagus, particularly in the lower third Esophageal motility: Significant dysmotility with tertiary contractions Hiatal Hernia: Large mixed type hernia, type 3 hernia with gastroesophageal junction just above the esophageal hiatus and approximately 90% of the stomach herniated into the chest with inversion of the stomach such that the greater curvature is directed cephalad. Mild narrowing of the distal esophagus did not impede passage of barium tablet into the stomach. Gastroesophageal reflux: Mild reflux seen in the prone position only. Ingested 13 mm barium tablet: Passed normally Other: Sternotomy wires present IMPRESSION: Patulous esophagus with mild narrowing of the distal esophagus and associated  dysmotility. The possibility of distal esophageal stricture is considered. Endoscopic assessment may be helpful for further evaluation as warranted, this could also be related to mass effect from the mixed type hernia. Large hiatal hernia with mixed type characteristics, GE junction just above esophageal hiatus and inversion of the stomach with greater curvature directed cephalad with approximately 80-90% of the stomach herniated into the chest. Emptying of the intrathoracic stomach into the intra-abdominal stomach showed no signs of obstruction related to the large hiatal hernia. Mild gastroesophageal reflux. Electronically Signed   By: Zetta Bills M.D.   On: 08/03/2021 11:59    Assessment and Plan:   Wanda Nelson is a 88 y.o. y/o female has been referred for EGD prior to repair of a large paraesophageal hernia.  Plan 1.  Proceed with EGD would likely require intubation to protect the airway but we will leave it up to anesthesia to decide on the day of the procedure   I have discussed alternative options, risks & benefits,  which include, but are not limited to, bleeding, infection, perforation,respiratory complication & drug reaction.  The patient agrees with this plan & written consent will be obtained.     Follow up in as needed  Dr Jonathon Bellows MD,MRCP(U.K)

## 2021-08-10 ENCOUNTER — Ambulatory Visit (INDEPENDENT_AMBULATORY_CARE_PROVIDER_SITE_OTHER): Payer: Medicare Other | Admitting: Cardiovascular Disease

## 2021-08-10 ENCOUNTER — Encounter: Payer: Self-pay | Admitting: Cardiovascular Disease

## 2021-08-10 VITALS — BP 120/74 | HR 58 | Ht 61.0 in | Wt 119.4 lb

## 2021-08-10 DIAGNOSIS — I10 Essential (primary) hypertension: Secondary | ICD-10-CM

## 2021-08-10 DIAGNOSIS — D151 Benign neoplasm of heart: Secondary | ICD-10-CM

## 2021-08-10 DIAGNOSIS — E785 Hyperlipidemia, unspecified: Secondary | ICD-10-CM | POA: Diagnosis not present

## 2021-08-10 DIAGNOSIS — I4891 Unspecified atrial fibrillation: Secondary | ICD-10-CM

## 2021-08-10 DIAGNOSIS — Z01810 Encounter for preprocedural cardiovascular examination: Secondary | ICD-10-CM

## 2021-08-10 DIAGNOSIS — I9789 Other postprocedural complications and disorders of the circulatory system, not elsewhere classified: Secondary | ICD-10-CM | POA: Diagnosis not present

## 2021-08-10 NOTE — Progress Notes (Signed)
Cardiology Office Note   Date:  08/10/2021   ID:  Nallely, Yost 1931/05/20, MRN [id number]  PCP:  McLean-Scocuzza, Nino Glow, MD  Cardiologist:   [name], MD   Chief Complaint  Patient presents with   Other    6 Month f/u. Meds reviewed verbally with pt. No new cardiac concerns. Needs clearance for pre-op.       History of Present Illness: Wanda Nelson is a 88 y.o. female who presents for a follow-up visit post atrial myxoma resection. She has known history of anemia, hiatal hernia, hyperlipidemia and remote history of hemorrhagic stroke.  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  Cardiac catheterization before surgery showed no evidence of obstructive coronary artery disease.  She had intermittent palpitations and tachycardia shortly after surgery due to short runs of nonsustained ventricular tachycardia and intermittent A. fib with RVR. She did not require treatment for this and her symptoms resolved without intervention.  Echocardiogram in December 2020 showed normal LV systolic function with no significant valvular abnormalities.  There was a concern about possible recurrent atrial mass.  However, it appeared to be prominent scarring at the surgical site.  Most recent echocardiogram in June 2021showed no evidence of mass.  EF was 50 to 55%.  He had COVID-19 infection in May of 2022 after a trip to [place].  She she had significant shortness of breath but ultimately recovered.   An echocardiogram was done in July 2022 which showed normal LV systolic function, grade 1 diastolic dysfunction and mild pulmonary hypertension.  There was moderate tricuspid regurgitation.  She was hospitalized recently at [facility] with shortness of breath.  [place] in February of this year.  She underwent an echocardiogram which showed normal LV systolic function with mild LVH and  stable mild to moderate tricuspid regurgitation.  CT of the chest showed evidence of pulmonary fibrosis.  I felt that her dyspnea was noncardiac.  She was evaluated by pulmonary and dyspnea was felt to be due to pulmonary fibrosis from large lateral hernia causing chronic reflux.  She is considering surgery. She has been doing reasonably well with no chest pain but she continues to be limited by shortness of breath and reflux symptoms.  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   Squamous cell carcinoma of skin 11/12/2019   L distal dorsum forearm [facility] ED[facility]C    [facility]    Past Surgical History:  Procedure Laterality Date   ABDOMINAL HYSTERECTOMY  1982   total   BUNIONECTOMY Bilateral    CLIPPING OF ATRIAL APPENDAGE  03/21/2018   Procedure: Clipping Of Atrial Appendage - using an AtriCure 456mlip;  Surgeon: OweRexene AlbertsD;  Location[facility];  Service: Open Heart Surgery;;   EXCISION OF ATRIAL MYXOMA N/A 03/21/2018   Procedure: Excision Of Atrial Myxoma;  Surgeon: OweRoxy Manns  Valentina Gu, MD;  Location: [facility];  Service: Open Heart Surgery;  Laterality: N/A;   RECTOCELE REPAIR     RIGHT/LEFT HEART CATH AND CORONARY ANGIOGRAPHY N/A 03/19/2018   Procedure: RIGHT/LEFT HEART CATH AND CORONARY ANGIOGRAPHY;  Surgeon: Wellington Hampshire, MD;  Location: [facility];  Service: Cardiovascular;  Laterality: N/A;   TEE WITHOUT CARDIOVERSION N/A 03/21/2018   Procedure: TRANSESOPHAGEAL ECHOCARDIOGRAM (TEE);  Surgeon: Rexene Alberts, MD;  Location: [facility];  Service: Open Heart Surgery;  Laterality: N/A;    TONSILLECTOMY       Current Outpatient Medications  Medication Sig Dispense Refill   acetaminophen (TYLENOL) 325 MG tablet Take 2 tablets (650 mg total) by mouth every 6 (six) hours as needed for mild pain (or Fever >/= 101).     Ascorbic Acid (VITAMIN C PO) Take by mouth daily at 12 noon.     aspirin EC 81 MG tablet Take 1 tablet (81 mg total) by mouth daily. 90 tablet 3   atorvastatin (LIPITOR) 20 MG tablet Take 1 tablet (20 mg total) by mouth daily at 6 PM. 90 tablet 3   Calcium Carbonate-Vitamin D 500-125 MG-UNIT TABS Take 1 tablet by mouth daily.     cholecalciferol (VITAMIN D) 1000 units tablet Take 1,000 Units by mouth daily.     ferrous sulfate (FEROSUL) 325 (65 FE) MG tablet Take 1 tablet (325 mg total) by mouth daily with breakfast. With vitamin C 500 mg daily 90 tablet 3   Flaxseed, Linseed, (FLAX SEED OIL) 1300 MG CAPS Take 1,100 mg by mouth daily.      ipratropium-albuterol (DUONEB) 0.5-2.5 (3) MG/3ML SOLN Take 3 mLs by nebulization every 6 (six) hours as needed. 360 mL 0   loratadine (CLARITIN) 10 MG tablet Take 1 tablet (10 mg total) by mouth daily. As needed at night 90 tablet 3   losartan (COZAAR) 25 MG tablet Take 0.5 tablets (12.5 mg total) by mouth 2 (two) times daily. Take extra pill if BP on top >140/>80 (bottom) (Patient taking differently: Take 12.5 mg by mouth daily. Take extra pill if BP on top >140/>80 (bottom)) 90 tablet 3   Multiple Vitamin (MULTIVITAMIN) tablet Take 1 tablet by mouth daily.     Omega-3 Fatty Acids (FISH OIL) 1200 MG CAPS Take by mouth daily at 8 pm.     pantoprazole (PROTONIX) 40 MG tablet Take 40 mg by mouth daily.     No current facility-administered medications for this visit.    Allergies:   Bee venom, Codeine, Morphine and related, Shellfish allergy, Bactrim [sulfamethoxazole-trimethoprim], Norvasc [amlodipine], and Nitrofurantoin    Social History:  The patient  reports that she has never smoked. She has never used smokeless tobacco. She  reports that she does not drink alcohol and does not use drugs.   Family History:  The patient's family history includes Cancer in her brother; Colon cancer in her brother, maternal grandmother, and maternal uncle; Coronary artery disease in her brother; Esophageal varices in her brother; Kidney disease in her brother.    ROS:  Please see the history of present illness.   Otherwise, review of systems are positive for none.   All other systems are reviewed and negative.    PHYSICAL EXAM: VS:  BP 120/74 (BP Location: Left Arm, Patient Position: Sitting, Cuff Size: Normal)   Pulse (!) 58   Ht '5\' 1"'$  (1.549 m)   Wt 119 lb 6.4 oz (54.2 kg)   LMP  (LMP Unknown)  SpO2 92%   BMI 22.56 kg/m  , BMI Body mass index is 22.56 kg/m. GEN: Well nourished, well developed, in no acute distress  HEENT: normal  Neck: no JVD, carotid bruits, or masses Cardiac: RRR; no murmurs, rubs, or gallops,no edema  Respiratory:  clear to auscultation bilaterally, normal work of breathing GI: soft, nontender, nondistended, + BS MS: no deformity or atrophy  Skin: warm and dry, no rash Neuro:  Strength and sensation are intact Psych: euthymic mood, full affect   EKG:  EKG is ordered today. The ekg ordered today demonstrates sinus bradycardia with poor R wave progression in the anterior leads.  No significant change compared to most recent EKG.    Recent Labs: 11/16/2020: TSH 1.40 05/08/2021: ALT 25; B Natriuretic Peptide 69.7 05/16/2021: BUN 27; Creatinine, Ser 0.88; Hemoglobin 13.9; Platelets 193.0; Potassium 4.3; Sodium 139    Lipid Panel    Component Value Date/Time   CHOL 167 05/16/2021 1042   TRIG 113.0 05/16/2021 1042   HDL 59.80 05/16/2021 1042   CHOLHDL 3 05/16/2021 1042   VLDL 22.6 05/16/2021 1042   LDLCALC 84 05/16/2021 1042      Wt Readings from Last 3 Encounters:  08/10/21 119 lb 6.4 oz (54.2 kg)  08/08/21 119 lb (54 kg)  07/19/21 117 lb (53.1 kg)          No data to display             ASSESSMENT AND PLAN:  1.  Status post surgical resection of atrial myxoma: No evidence of recurrent myxoma on recent echocardiogram.  2.  Postoperative atrial fibrillation: No evidence of recurrent arrhythmia.  She is in sinus rhythm today.  3.  Hyperlipidemia: She is currently on atorvastatin 20 mg daily.  I reviewed most recent lipid profile done on November which showed an LDL of 95.  4. Essential hypertension: Blood pressure is controlled on current medications.  5.  Preoperative cardiovascular evaluation for large hiatal hernia repair: The patient has excellent exercise capacity for age and recent echocardiogram showed normal LV systolic function with no significant valvular abnormalities.  Cardiac catheterization in 2020 showed no evidence of coronary artery disease.  Based on all of the above, the patient can proceed with surgery at an overall low risk from a cardiac standpoint but obviously given her age and frail status, she is at higher risk for complications overall.  No need for further ischemic cardiac evaluation before surgery.  Aspirin can be held 5 to 7 days before surgery if needed.     Disposition:   FU with me in 6 months  Signed,  [name], MD  08/10/2021 1:38 PM    [place]

## 2021-08-10 NOTE — Patient Instructions (Signed)

## 2021-08-15 ENCOUNTER — Other Ambulatory Visit: Payer: Self-pay | Admitting: Student in an Organized Health Care Education/Training Program

## 2021-08-31 DIAGNOSIS — K449 Diaphragmatic hernia without obstruction or gangrene: Secondary | ICD-10-CM | POA: Diagnosis not present

## 2021-09-06 ENCOUNTER — Ambulatory Visit
Admission: RE | Admit: 2021-09-06 | Discharge: 2021-09-06 | Disposition: A | Discharge status: Home / Self Care | Payer: Medicare Other | Attending: Gastroenterology | Admitting: Gastroenterology

## 2021-09-06 ENCOUNTER — Ambulatory Visit: Payer: Medicare Other | Admitting: Anesthesiology

## 2021-09-06 ENCOUNTER — Encounter
Admission: RE | Disposition: A | Discharge status: Home / Self Care | Payer: Self-pay | Source: Home / Self Care | Attending: Gastroenterology

## 2021-09-06 DIAGNOSIS — D649 Anemia, unspecified: Secondary | ICD-10-CM | POA: Diagnosis not present

## 2021-09-06 DIAGNOSIS — Z01818 Encounter for other preprocedural examination: Secondary | ICD-10-CM | POA: Insufficient documentation

## 2021-09-06 DIAGNOSIS — K449 Diaphragmatic hernia without obstruction or gangrene: Secondary | ICD-10-CM

## 2021-09-06 DIAGNOSIS — E785 Hyperlipidemia, unspecified: Secondary | ICD-10-CM | POA: Diagnosis not present

## 2021-09-06 DIAGNOSIS — J8410 Pulmonary fibrosis, unspecified: Secondary | ICD-10-CM | POA: Insufficient documentation

## 2021-09-06 DIAGNOSIS — J4530 Mild persistent asthma, uncomplicated: Secondary | ICD-10-CM | POA: Diagnosis not present

## 2021-09-06 DIAGNOSIS — K219 Gastro-esophageal reflux disease without esophagitis: Secondary | ICD-10-CM | POA: Diagnosis not present

## 2021-09-06 DIAGNOSIS — I10 Essential (primary) hypertension: Secondary | ICD-10-CM | POA: Diagnosis not present

## 2021-09-06 DIAGNOSIS — I639 Cerebral infarction, unspecified: Secondary | ICD-10-CM | POA: Diagnosis not present

## 2021-09-06 DIAGNOSIS — Z79899 Other long term (current) drug therapy: Secondary | ICD-10-CM | POA: Diagnosis not present

## 2021-09-06 DIAGNOSIS — Z8673 Personal history of transient ischemic attack (TIA), and cerebral infarction without residual deficits: Secondary | ICD-10-CM | POA: Insufficient documentation

## 2021-09-06 HISTORY — PX: ESOPHAGOGASTRODUODENOSCOPY (EGD) WITH PROPOFOL: SHX5813

## 2021-09-06 SURGERY — ESOPHAGOGASTRODUODENOSCOPY (EGD) WITH PROPOFOL
Anesthesia: General

## 2021-09-06 MED ORDER — SODIUM CHLORIDE 0.9 % IV SOLN
INTRAVENOUS | Status: DC
  Administered 2021-09-06: 20 mL/h via INTRAVENOUS

## 2021-09-06 MED ORDER — LIDOCAINE HCL (CARDIAC) PF 100 MG/5ML IV SOSY
PREFILLED_SYRINGE | INTRAVENOUS | Status: DC | PRN
  Administered 2021-09-06: 25 mg via INTRAVENOUS

## 2021-09-06 MED ORDER — DEXAMETHASONE SODIUM PHOSPHATE 10 MG/ML IJ SOLN
INTRAMUSCULAR | Status: DC | PRN
  Administered 2021-09-06: 4 mg via INTRAVENOUS

## 2021-09-06 MED ORDER — ONDANSETRON HCL 4 MG/2ML IJ SOLN
INTRAMUSCULAR | Status: DC | PRN
  Administered 2021-09-06: 4 mg via INTRAVENOUS

## 2021-09-06 MED ORDER — PROPOFOL 10 MG/ML IV BOLUS
INTRAVENOUS | Status: DC | PRN
  Administered 2021-09-06: 100 mg via INTRAVENOUS

## 2021-09-06 MED ORDER — SODIUM CHLORIDE 0.9 % IV SOLN: INTRAVENOUS | Status: DC | PRN

## 2021-09-06 MED ORDER — SUCCINYLCHOLINE CHLORIDE 200 MG/10ML IV SOSY
PREFILLED_SYRINGE | INTRAVENOUS | Status: DC | PRN
  Administered 2021-09-06: 100 mg via INTRAVENOUS

## 2021-09-06 NOTE — Anesthesia Postprocedure Evaluation (Signed)
Anesthesia Post Note  Patient: Wanda Nelson  Procedure(s) Performed: ESOPHAGOGASTRODUODENOSCOPY (EGD) WITH PROPOFOL  Patient location during evaluation: Endoscopy Anesthesia Type: General Level of consciousness: awake and alert Pain management: pain level controlled Vital Signs Assessment: post-procedure vital signs reviewed and stable Respiratory status: spontaneous breathing, nonlabored ventilation and respiratory function stable Cardiovascular status: blood pressure returned to baseline and stable Postop Assessment: no apparent nausea or vomiting Anesthetic complications: no   No notable events documented.   Last Vitals:  Vitals:   09/06/21 1200 09/06/21 1220  BP: (!) 175/89 (!) 168/88  Pulse: 65   Resp: (!) 23 15  Temp:    SpO2: 98% 99%    Last Pain:  Vitals:   09/06/21 1200  TempSrc:   PainSc: 0-No pain                 [place] Ouch

## 2021-09-06 NOTE — Anesthesia Preprocedure Evaluation (Addendum)
Anesthesia Evaluation  Patient identified by MRN, date of birth, ID band Patient awake    Reviewed: Allergy & Precautions, H&P , NPO status , Patient's Chart, lab work & pertinent test results  Airway Mallampati: II  TM Distance: >3 FB Neck ROM: Full    Dental no notable dental hx. (+) Teeth Intact, Dental Advisory Given   Pulmonary asthma (mild persistent) ,  Lung fibrosis     + decreased breath sounds (Left side)      Cardiovascular Exercise Tolerance: Good METS: 3 - Mets hypertension, Pt. on medications  Rhythm:Regular Rate:Normal  s/p resection left atrial myxoma with sternotomy and CPB 2020  ECHO 1. Left ventricular ejection fraction, by estimation, is 60 to 65%. The  left ventricle has normal function. The left ventricle has no regional  wall motion abnormalities. There is mild left ventricular hypertrophy.  Left ventricular diastolic parameters  were normal.  2. Right ventricular systolic function is normal. The right ventricular  size is normal. Tricuspid regurgitation signal is inadequate for assessing  PA pressure.  3. The mitral valve is normal in structure. No evidence of mitral valve  regurgitation. No evidence of mitral stenosis.  4. Tricuspid valve regurgitation is mild to moderate.  5. The aortic valve is normal in structure. Aortic valve regurgitation is  not visualized. Aortic valve sclerosis/calcification is present, without  any evidence of aortic stenosis.    Neuro/Psych CVA (1990), No Residual Symptoms negative psych ROS   GI/Hepatic Neg liver ROS, hiatal hernia, GERD  Medicated, 80 to 90% of stomach herniated into the chest Severe reflux with LPR    Endo/Other  negative endocrine ROS  Renal/GU negative Renal ROS  negative genitourinary   Musculoskeletal   Abdominal Normal abdominal exam  (+)   Peds  Hematology  (+) Blood dyscrasia, anemia ,   Anesthesia Other Findings    Reproductive/Obstetrics negative OB ROS                            Anesthesia Physical  Anesthesia Plan  ASA: III  Anesthesia Plan: General   Post-op Pain Management: Minimal or no pain anticipated   Induction: Intravenous and Rapid sequence  PONV Risk Score and Plan: 3 and Propofol infusion, TIVA and Ondansetron  Airway Management Planned: Oral ETT  Additional Equipment:   Intra-op Plan:   Post-operative Plan: Extubation in OR  Informed Consent: I have reviewed the patients History and Physical, chart, labs and discussed the procedure including the risks, benefits and alternatives for the proposed anesthesia with the patient or authorized representative who has indicated his/her understanding and acceptance.     Dental advisory given  Plan Discussed with: CRNA and Anesthesiologist  Anesthesia Plan Comments:        Anesthesia Quick Evaluation

## 2021-09-06 NOTE — Op Note (Signed)
[facility] Patient Name: Wanda Nelson Procedure Date: 09/06/2021 11:15 AM MRN: [id number] Account #: [id number] Date of Birth: 1931/03/31 Admit Type: Outpatient Age: 88 Room: [facility] ENDO ROOM 3 Gender: Female Note Status: Finalized Instrument Name: Upper Endoscope [id number] Procedure:             Upper GI endoscopy Indications:           Preoperative assessment Providers:             Jonathon Bellows MD, MD Medicines:             Monitored Anesthesia Care Complications:         No immediate complications. Procedure:             Pre-Anesthesia Assessment:                        - Prior to the procedure, a History and Physical was                         performed, and patient medications, allergies and                         sensitivities were reviewed. The patient's tolerance                         of previous anesthesia was reviewed.                        - The risks and benefits of the procedure and the                         sedation options and risks were discussed with the                         patient. All questions were answered and informed                         consent was obtained.                        - After reviewing the risks and benefits, the patient                         was deemed in satisfactory condition to undergo the                         procedure.                        - ASA Grade Assessment: III - A patient with severe                         systemic disease.                        After obtaining informed consent, the endoscope was                         passed under direct vision. Throughout the procedure,  the patient's blood pressure, pulse, and oxygen                         saturations were monitored continuously. The                         Endosonoscope was introduced through the mouth, and                         advanced to the third part of duodenum. The upper GI                          endoscopy was accomplished without difficulty. Findings:      The examined duodenum was normal.      A large hiatal hernia was present.      The esophagus was normal.      The cardia and gastric fundus were normal on retroflexion. Impression:            - Normal examined duodenum.                        - Large hiatal hernia.                        - Normal esophagus.                        - No specimens collected. Recommendation:        - Discharge patient to home (with escort).                        - Resume previous diet.                        - Continue present medications.                        - Return to my office PRN. Procedure Code(s):     --- Professional ---                        [phone], Esophagogastroduodenoscopy, flexible,                         transoral; diagnostic, including collection of                         specimen(s) by brushing or washing, when performed                         (separate procedure) Diagnosis Code(s):     --- Professional ---                        K44.9, Diaphragmatic hernia without obstruction or                         gangrene                        Z01.818, Encounter for other preprocedural examination CPT copyright 2019 [employer] Association. All rights reserved. The codes documented in this report are preliminary  and upon coder review may  be revised to meet current compliance requirements. Jonathon Bellows, MD Jonathon Bellows MD, MD 09/06/2021 11:39:10 AM This report has been signed electronically. Number of Addenda: 0 Note Initiated On: 09/06/2021 11:15 AM Estimated Blood Loss:  Estimated blood loss: none.      [facility]

## 2021-09-06 NOTE — Anesthesia Procedure Notes (Signed)
Procedure Name: Intubation Date/Time: 09/06/2021 11:31 AM  Performed by: Beverely Low, CRNAPre-anesthesia Checklist: Patient identified, Patient being monitored, Timeout performed, Emergency Drugs available and Suction available Patient Re-evaluated:Patient Re-evaluated prior to induction Oxygen Delivery Method: Circle system utilized Preoxygenation: Pre-oxygenation with 100% oxygen Induction Type: IV induction Ventilation: Mask ventilation without difficulty Laryngoscope Size: 3 and McGraph Grade View: Grade I Tube type: Oral Tube size: 7.0 mm Number of attempts: 1 Airway Equipment and Method: Stylet Placement Confirmation: ETT inserted through vocal cords under direct vision, positive ETCO2 and breath sounds checked- equal and bilateral Secured at: 21 cm Tube secured with: Tape Dental Injury: Teeth and Oropharynx as per pre-operative assessment

## 2021-09-06 NOTE — Transfer of Care (Signed)
Immediate Anesthesia Transfer of Care Note  Patient: Wanda Nelson  Procedure(s) Performed: ESOPHAGOGASTRODUODENOSCOPY (EGD) WITH PROPOFOL  Patient Location: PACU  Anesthesia Type:General  Level of Consciousness: awake  Airway & Oxygen Therapy: Patient Spontanous Breathing  Post-op Assessment: Report given to RN and Post -op Vital signs reviewed and stable  Post vital signs: Reviewed and stable  Last Vitals:  Vitals Value Taken Time  BP 103/94 09/06/21 1151  Temp 36.4 C 09/06/21 1150  Pulse 59 09/06/21 1156  Resp 23 09/06/21 1156  SpO2 96 % 09/06/21 1156  Vitals shown include unvalidated device data.  Last Pain:  Vitals:   09/06/21 1150  TempSrc: Temporal  PainSc: 0-No pain         Complications: No notable events documented.

## 2021-09-06 NOTE — H&P (Signed)
Jonathon Bellows, MD [address] [place], [place], [place], [address] [id number], [place], [place], [place], [address] Phone: [phone]  Fax: [phone]  Primary Care Physician:  McLean-Scocuzza, Nino Glow, MD   Pre-Procedure History & Physical: HPI:  Wanda Nelson is a 88 y.o. 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   Squamous cell carcinoma of skin 11/12/2019   L distal dorsum forearm - ED&C    [facility]    Past Surgical History:  Procedure Laterality Date   ABDOMINAL HYSTERECTOMY  1982   total   BUNIONECTOMY Bilateral    CLIPPING OF ATRIAL APPENDAGE  03/21/2018   Procedure: Clipping Of Atrial Appendage - using an AtriCure 4581mlip;  Surgeon: OweRexene AlbertsD;  [facility];  Service: Open Heart Surgery;;   EXCISION OF ATRIAL MYXOMA N/A 03/21/2018   Procedure: Excision Of Atrial Myxoma;  Surgeon: OweRexene AlbertsD;  [facility];  Laterality: N/A;   RECTOCELE REPAIR     RIGHT/LEFT HEART CATH AND CORONARY ANGIOGRAPHY N/A 03/19/2018   Procedure: RIGHT/LEFT HEART CATH AND CORONARY ANGIOGRAPHY;  Surgeon: AriWellington HampshireD;  Location: [facility];  Service: Cardiovascular;  Laterality: N/A;   TEE WITHOUT CARDIOVERSION N/A 03/21/2018   Procedure: TRANSESOPHAGEAL ECHOCARDIOGRAM (TEE);  Surgeon: OweRexene AlbertsD;  [facility];  Laterality: N/A;   TONSILLECTOMY      Prior to Admission medications   Medication Sig Start Date End Date Taking?  Authorizing Provider  acetaminophen (TYLENOL) 325 MG tablet Take 2 tablets (650 mg total) by mouth every 6 (six) hours as needed for mild pain (or Fever >/= 101). 05/09/21  Yes AndRicharda OsmondD  Ascorbic Acid (VITAMIN C PO) Take by mouth daily at 12 noon.   Yes [provider]  aspirin EC 81 MG tablet Take 1 tablet (81 mg total) by mouth daily. 05/16/21  Yes McLean-Scocuzza, TraNino GlowD  atorvastatin (LIPITOR) 20 MG tablet Take 1 tablet (20 mg total) by mouth daily at 6 PM. 05/16/21  Yes McLean-Scocuzza, TraNino GlowD  Calcium Carbonate-Vitamin D 500-125 MG-UNIT TABS Take 1 tablet by mouth daily.   Yes [provider]  cholecalciferol (VITAMIN D) 1000 units tablet Take 1,000 Units by mouth daily.   Yes [provider]  ferrous sulfate (FEROSUL) 325 (65 FE) MG tablet Take 1 tablet (325 mg total) by mouth daily with breakfast. With vitamin C 500 mg daily 05/16/21  Yes McLean-Scocuzza, TraNino GlowD  Flaxseed, Linseed, (FLAX SEED OIL) 1300 MG CAPS Take 1,100 mg by mouth daily.    Yes [provider]  ipratropium-albuterol (DUONEB) 0.5-2.5 (3) MG/3ML SOLN INHALE THE CONTENTS OF ONE (1) VIAL VIA NEBULIZATION EVERY 6 HOURS AS NEEDED 08/15/21  Yes WebDutch Quint FNP  loratadine (CLARITIN) 10 MG tablet Take  1 tablet (10 mg total) by mouth daily. As needed at night 05/16/21 05/11/22 Yes McLean-Scocuzza, Nino Glow, MD  losartan (COZAAR) 25 MG tablet Take 0.5 tablets (12.5 mg total) by mouth 2 (two) times daily. Take extra pill if BP on top >140/>80 (bottom) Patient taking differently: Take 12.5 mg by mouth daily. Take extra pill if BP on top >140/>80 (bottom) 05/16/21  Yes McLean-Scocuzza, Nino Glow, MD  Multiple Vitamin (MULTIVITAMIN) tablet Take 1 tablet by mouth daily.   Yes [provider]  Omega-3 Fatty Acids (FISH OIL) 1200 MG CAPS Take by mouth daily at 8 pm.   Yes [provider]  pantoprazole (PROTONIX) 40 MG tablet Take 40 mg by mouth daily.   Yes [provider]    Allergies as of 08/08/2021 - Review Complete 08/08/2021  Allergen Reaction Noted   Bee venom Anaphylaxis and Hives 03/15/2015   Codeine Other (See Comments) 03/15/2015   Morphine and related  03/15/2015   Shellfish allergy Anaphylaxis and Hives 03/15/2015   Bactrim [sulfamethoxazole-trimethoprim]  06/24/2019   Norvasc [amlodipine]  07/20/2020   Nitrofurantoin Rash 03/15/2015    Family History  Problem Relation Age of Onset   Colon cancer Brother    Cancer Brother        neck tumor cancerous    Esophageal varices Brother    Colon cancer Maternal Uncle    Colon cancer Maternal Grandmother    Kidney disease Brother    Coronary artery disease Brother        with stent, cabg   Breast cancer Neg Hx     Social History   Socioeconomic History   Marital status: Widowed    Spouse name: Not on file   Number of children: Not on file   Years of education: Not on file   Highest education level: Not on file  Occupational History   Not on file  Tobacco Use   Smoking status: Never   Smokeless tobacco: Never  Vaping Use   Vaping Use: Never used  Substance and Sexual Activity   Alcohol use: No    Alcohol/week: 0.0 standard drinks of alcohol   Drug use: No   Sexual activity: Not Currently  Other Topics Concern   Not on file  Social History Narrative   Lives [place],  husband died 2018-12-21 due to brain hemorrhage   Daughter in law Alianny Toelle    widowed   Social Determinants of Health   Financial Resource Strain: Low Risk  (04/14/2020)   Overall Financial Resource Strain (CARDIA)    Difficulty of Paying Living Expenses: Not hard at all  Food Insecurity: No Food Insecurity (04/14/2020)   Hunger Vital Sign    Worried About Running Out of Food in the Last Year: Never true    [place] in the Last Year: Never true  Transportation Needs: No Transportation Needs (04/14/2020)   PRAPARE - Hydrologist (Medical): No    Lack of  Transportation (Non-Medical): No  Physical Activity: Sufficiently Active (04/14/2020)   Exercise Vital Sign    Days of Exercise per Week: 5 days    Minutes of Exercise per Session: 60 min  Stress: No Stress Concern Present (04/14/2020)   [place]    Feeling of Stress : Not at all  Social Connections: Unknown (04/14/2020)   Social Connection and Isolation Panel [NHANES]    Frequency of Communication with Friends and Family:  More than three times a week    Frequency of Social Gatherings with Friends and Family: More than three times a week    Attends Religious Services: Not on file    Active Member of Clubs or Organizations: Not on file    Attends Archivist Meetings: Not on file    Marital Status: Widowed  Intimate Partner Violence: Not At Risk (04/14/2020)   Humiliation, Afraid, Rape, and Kick questionnaire    Fear of Current or Ex-Partner: No    Emotionally Abused: No    Physically Abused: No    Sexually Abused: No    Review of Systems: See HPI, otherwise negative ROS  Physical Exam: BP (!) 174/76   Pulse (!) 58   Temp 97.8 F (36.6 C) (Temporal)   Resp 20   Ht '5\' 1"'$  (1.549 m)   Wt 50.8 kg   LMP  (LMP Unknown)   BMI 21.16 kg/m  General:   Alert,  pleasant and cooperative in NAD Head:  Normocephalic and atraumatic. Neck:  Supple; no masses or thyromegaly. Lungs:  Clear throughout to auscultation, normal respiratory effort.    Heart:  +S1, +S2, Regular rate and rhythm, No edema. Abdomen:  Soft, nontender and nondistended. Normal bowel sounds, without guarding, and without rebound.   Neurologic:  Alert and  oriented x4;  grossly normal neurologically.  Impression/Plan: Wanda Nelson is here for an endoscopy  to be performed for  evaluation of hiatal hernia     Risks, benefits, limitations, and alternatives regarding endoscopy have been reviewed with the patient.  Questions have been answered.   All parties agreeable.   Jonathon Bellows, MD  09/06/2021, 11:21 AM

## 2021-09-07 ENCOUNTER — Encounter: Payer: Self-pay | Admitting: Gastroenterology

## 2021-09-13 ENCOUNTER — Encounter: Payer: Self-pay | Admitting: Surgery

## 2021-09-13 ENCOUNTER — Ambulatory Visit (INDEPENDENT_AMBULATORY_CARE_PROVIDER_SITE_OTHER): Payer: Medicare Other | Admitting: Surgery

## 2021-09-13 VITALS — BP 154/71 | HR 62 | Temp 98.3°F | Ht 61.0 in | Wt 118.0 lb

## 2021-09-13 DIAGNOSIS — K449 Diaphragmatic hernia without obstruction or gangrene: Secondary | ICD-10-CM | POA: Diagnosis not present

## 2021-09-13 NOTE — Patient Instructions (Signed)
We will have you follow up here in about 6 weeks for a final recheck before your surgery on 11/14/21.  We have spoken today about repairing your Hiatal Hernia. Your surgery will be scheduled at [facility] with Dr. Dahlia Byes.  Plan to be in the hospital for 1-2 days if the minimally invasive surgery is completed without having to make a bigger incision. If the bigger incision is made, you will most likely need to be in the hospital 4-6 days. You will be on a liquid diet and need to recover for 2 weeks following your surgery prior to doing any of your normal activities. At the 2 week mark, we will see you in the office and if you are doing ok we will advance your diet and activity level as you tolerate.  Please see your Blue (Pre-care) Sheet for more information regarding your surgery. Our surgery scheduler will call you to verify surgery date and to go over information  You will need to arrange to be out of work for approximately 1-2 weeks and then you may return with a lifting restriction for 4 more weeks. If you have FMLA or Disability paperwork that needs to be filled out, please have your company fax your paperwork to [phone] or you may drop this by either office. This paperwork will be filled out within 3 days after your surgery has been completed.  Please call our office with any questions or concerns that you have regarding your surgery and recovery.    Laparoscopic Nissen Fundoplication Laparoscopic Nissen fundoplication is surgery to relieve heartburn and other problems caused by gastric fluids flowing up into your esophagus. The esophagus is the tube that carries food and liquid from your throat to your stomach. Normally, the muscle that sits between your stomach and your esophagus (lower esophageal sphincter or LES) keeps stomach fluids in your stomach. In some people, the LES does not work properly, and stomach fluids flow up into the esophagus. This can happen when part of the stomach bulges  through the LES (hiatal hernia). The backward flow of stomach fluids can cause a type of severe and long-standing heartburn that is called gastroesophageal reflux disease (GERD). You may need this surgery if other treatments for GERD have not helped. In a laparoscopic Nissen fundoplication, the upper part of your stomach is wrapped around the lower part of your esophagus to strengthen the LES and prevent reflux. If you have a hiatal hernia, it will also be repaired with this surgery. The procedure is done through several small incisions in your abdomen. It is performed using a thin, telescopic instrument (laparoscope) and other instruments that can pass through the scope or through other small incisions. Tell a health care provider about: Any allergies you have. All medicines you are taking, including vitamins, herbs, eye drops, creams, and over-the-counter medicines. Any problems you or family members have had with anesthetic medicines. Any blood disorders you have. Any surgeries you have had. Any medical conditions you have. What are the risks? Generally, this is a safe procedure. However, problems may occur, including: Difficulty swallowing (dysphagia). Bloating. Nausea or vomiting. Damage to the lung, causing a collapsed lung. Infection or bleeding. What happens before the procedure? Ask your health care provider about: Changing or stopping your regular medicines. This is especially important if you are taking diabetes medicines or blood thinners. Taking medicines such as aspirin and ibuprofen. These medicines can thin your blood. Do not take these medicines before your procedure if your health care provider  asks you not to. Follow your health care provider's instructions about eating or drinking restrictions. Plan to have someone take you home after the procedure. What happens during the procedure? An IV tube will be inserted into one of your veins. It will be used to give you fluids and  medicines during the procedure. You will be given a medicine that makes you fall asleep (general anesthetic). Your abdomen will be cleaned with a germ-killing solution (antiseptic). The surgeon will make a small incision in your abdomen and insert a tube through the incision. Your abdomen will be filled with a gas. This helps the surgeon to see your organs more easily and it makes more space to work. The surgeon will insert the laparoscope through the incision. The scope has a camera that will send pictures to a monitor in the operating room. The surgeon will make several other small incisions in your abdomen to insert the other instruments that are needed during the procedure. Another instrument (dilator) will be passed through your mouth and down your esophagus into the upper part of your stomach. The dilator will prevent your LES from being closed too tightly during surgery. The surgeon will pass the top portion of your stomach behind the lower part of your esophagus and wrap it all the way around. This will be stitched into place. If you have a hiatal hernia, it will be repaired during this procedure. All instruments will be removed, and your incisions will be closed under your skin with stitches (sutures). Skin adhesive strips may also be used. A bandage (dressing) will be placed on your skin over the incisions. The procedure may vary among health care providers and hospitals. What happens after the procedure? You will be moved to a recovery area. Your blood pressure, heart rate, breathing rate, and blood oxygen level will be monitored often until the medicines you were given have worn off. You will be given pain medicine as needed. Your IV tube will be kept in until you are able to drink fluids. This information is not intended to replace advice given to you by your health care provider. Make sure you discuss any questions you have with your health care provider. Document Released:  01/15/2014 Document Revised: 06/02/2015 Document Reviewed: 08/26/2013 Elsevier Interactive Patient Education  2017 [employer]   Laparoscopic Nissen Fundoplication, Care After Refer to this sheet in the next few weeks. These instructions provide you with information about caring for yourself after your procedure. Your health care provider may also give you more specific instructions. Your treatment has been planned according to current medical practices, but problems sometimes occur. Call your health care provider if you have any problems or questions after your procedure. What can I expect after the procedure? After the procedure, it is common to have: Difficulty swallowing (dysphagia). Excess gas (bloating). Follow these instructions at home: Medicines  Take medicines only as directed by your health care provider. Do not drive or operate heavy machinery while taking pain medicine. Incision care  There are many different ways to close and cover an incision, including stitches (sutures), skin glue, and adhesive strips. Follow your health care provider's instructions about: Incision care. Bandage (dressing) changes and removal. Incision closure removal. Check your incision areas every day for signs of infection. Watch for: Redness, swelling, or pain. Fluid, blood, or pus. Do not take baths, swim, or use a hot tub until your health care provider approves. Take showers as directed by your health care provider. Eating and drinking  Follow your health care provider's instructions about eating. You may need to follow a liquid-only diet for 2 weeks, followed by a diet of soft foods for 2 weeks. You should return to your usual diet gradually. Drink enough fluid to keep your urine clear or pale yellow. Activity  Return to your normal activities as directed by your health care provider. Ask your health care provider what activities are safe for you. Avoid strenuous exercise. Do not lift  anything that is heavier than 10 lb (4.5 kg). Ask your health care provider when you can: Return to sexual activity. Drive. Go back to work. Contact a health care provider if: You have a fever. Your pain gets worse or is not helped by medicine. You have frequent nausea or vomiting. You have continued abdominal bloating. You have an ongoing (persistent) cough. You have redness, swelling, or pain in any incision areas. You have fluid, blood, or pus coming from any incisions. Get help right away if: You have trouble breathing. You are unable to swallow. You have persistent vomiting. You have blood in your vomit. You have severe abdominal pain. This information is not intended to replace advice given to you by your health care provider. Make sure you discuss any questions you have with your health care provider. Document Released: 08/18/2003 Document Revised: 06/02/2015 Document Reviewed: 08/26/2013 Elsevier Interactive Patient Education  2017 [place].   Diet After Nissen Fundoplication Surgery This diet information is for patients who have recently had Nissen fundoplication surgery to correct reflux disease or to repair various types of hernias, such as hiatal hernia and intrathoracic stomach. This diet may also be used for other gastrointestinal surgeries, such as Heller myotomy and repair of achalasia. The diet will help control diarrhea, excess gas and swallowing problems, which may occur after this type of surgery. Keeping Your Stomach from Stretching Eat small, frequent meals (six to eight per day). This will help you consume the majority of the nutrients you need without causing your stomach to feel full or distended.  Drinking large amounts of fluids with meals can stretch your stomach. You may drink fluids between meals as often as you like, but limit fluids to 1/2 cup (4 fluid ounces) with meals and one cup (8 fluid ounces) with snacks.  Sit upright while eating and stay  upright for 30 minutes after each meal. Gravity can help food move through your digestive tract. Do not lie down after eating. Sit upright for 2 hours after your last meal or snack of the day.  Eat very slowly. Take your time when eating.  Take small bites and chew your food well to help aid in swallowing and digestion.  Avoid crusty breads and sticky, gummy foods, such as bananas, fresh doughy breads, rolls and doughnuts. These types of foods become sticky and difficult to swallow.  Toasted breads tend to be better tolerated.  Lastly, if you eat sweets, consume them at the end of your meal to avoid a group of symptoms referred to as "dumping syndrome". This describes the rapid emptying of foods from the stomach to the small intestine. Sweetened beverages, candy and desserts move more rapidly and dump quickly into the intestines. This can cause symptoms of nausea, weakness, cold sweats, cramps, diarrhea and dizzy spells.  Avoiding Gas Avoid drinking through a straw. Do not chew gum or tobacco. These actions cause you to swallow air, which produces excess gas in your stomach. Chew with your mouth closed.  Avoid any foods that cause  stomach gas and distention. These foods include corn, dried beans, peas, lentils, onions, broccoli, cauliflower and any food from the cabbage family.  Avoid carbonated drinks, alcohol, citrus and tomato products.  When will I be able to eat a soft diet? After Nissen fundoplication surgery, your diet will be advanced slowly by your surgeon. Generally, you will be on a clear liquid diet for the first few meals. Then you will advance to the full liquid diet for a meal or two and eventually to a Nissen soft diet. Please be aware that each patient's tolerance to food is different. Your doctor will advance your diet depending on how well you progress after surgery. Clear Liquid Diet  The first diet after surgery is the clear liquid diet. It includes the following liquids: Apple  juice  Cranberry juice  Grape juice  Chicken broth  Beef broth  Flavored gelatin (Jell-O)  Decaf tea and coffee  Caffeinated beverages are permitted based on tolerance  Popsicles  [place] ice Carbonated drinks (sodas) are not allowed for the first six to eight weeks after surgery. After this time you can try them again in small amounts.  Full Liquid Diet The full liquid diet contains anything on the clear liquid diet, plus: Milk, soy, rice and almond (no chocolate)  Cream of wheat, cream of rice, grits  Strained creamed soups (no tomato or broccoli)  Vanilla and strawberry-flavored ice cream  Sherbet  Blended, custard styled or whipped yogurt (plain or vanilla only)  Vanilla and butterscotch pudding (no chocolate or coconut)  Nutritional drinks including Ensure, Boost, Carnation Instant Breakfast (no chocolate-flavored) Note: Dairy products, such as milk, ice cream and pudding, may cause diarrhea in some people just after surgery. You may need to avoid milk products. If so, substitute them with lactose-free beverages, such as soy, rice, Lactaid or almond milks.  Nissen Soft Diet Food Category Foods to Choose Foods to Avoid  Beverages Milk, such as, whole, 2%, 1%, non-fat, or skim, soy, rice, almond  Caffeinated and decaf tea and coffee  Powdered drink mixes (in moderation)  Non-citrus juices (apple, grape, cranberry or blends of these)  Fruit nectars  Nutritional drinks including Boost, Ensure, Carnation Instant Breakfast Chocolate milk, cocoa or other chocolate-flavored drinks  Carbonated drinks  Alcohol  Citrus juices like orange, grapefruit, lemon and lime  Breads Pancakes, [place] toast and waffles  Crackers (saltine, butter, soda, graham, Goldfish and Cheese Nips)  Toasted bread Untoasted bread, bagels, [place] and hard rolls, English muffins  Crackers with nuts, seeds, fresh or dried fruit, coconut, or highly seasoned, such as garlic or onion-flavored  Sweet  rolls, coffee cake or doughnuts  Cereals Well cooked cereals, such as oatmeal (plain or flavored)  Cold cereal (Cornflakes, Rice Krispies, Cheerios, Special K plain, Rice Chex and puffed rice) Very coarse cereal, such as bran, shredded wheat  Any cereal with fresh or dried fruit, coconut, seeds or nuts  Desserts Eat in moderation and do not eat desserts or sweets by themselves. Plain cakes, cookies and cream-filled pies  Vanilla and butterscotch pudding or custard  Ice cream, ice milk, frozen yogurt and sherbet  Gelatin made from allowed foods  Fruit ices and popsicles Desserts containing chocolate, coconut, nuts, seeds, fresh or dried fruit, peppermint or spearmint  Eggs  Poached, hard boiled or scrambled Fried eggs and highly seasoned eggs (deviled eggs)  Fats Eat in moderation. Butter and margarine  Mayonnaise and vegetable oils  Mildly seasoned cream sauces and gravies  Plain cream cheese  Sour cream Highly seasoned salad dressings, cream sauces and gravies  Bacon, bacon fat, ham fat, lard and salt pork  Fried foods  Nuts  Fruits Fruit juice  Any canned or cooked fruit except those listed in the AVOID column ALL fresh fruits, such as citrus, bananas and pineapple  Canned pineapple  Dried fruits, such as raisins, berries  Fruits with seeds, such as berries, kiwi and figs  Meat, Fish, Poultry, and [employer] may be ground, minced or chopped to ease swallowing and digestion  Tender, well cooked and moist cuts of beef, chicken, [place] and pork  Veal and lamb  Flaky, cooked fish  Canned tuna  Cottage and ricotta cheeses  Mild cheese, such as American, brick, mozzarella and baby Swiss  Creamy peanut butter  Plain custard or blended fruit yogurt  Moist casseroles, such as macaroni & cheese, tuna noodle  Grilled or toasted cheese sandwich Tough meats with a lot of gristle  Fried, highly seasoned, smoked and fatty meat, fish or poultry, such as frankfurters, luncheon  meats, sausage, bacon, spare ribs, beef brisket, sardines, anchovies, duck and goose  Chili and other entrees made with pepper or chili pepper  Shellfish  Strongly flavored cheeses, such as sharp cheese, extra sharp cheddar, cheese containing peppers or other seasonings  Crunchy peanut butter  Any yogurt with nuts, seeds, coconut, strawberries or raspberries  Potatoes and Starches Peeled, mashed or boiled white or sweet potatoes  Oven-baked potatoes without skin  Well cooked white rice, enriched noodles, barley, spaghetti, macaroni and other pastas Fried potatoes, potato skins and potato chips  Hard and soft taco shells  Fried, brown or wild rice  Soups Mildly flavored meat stocks  Cream soups made from allowed foods Highly seasoned soups and tomato based soups, cream soups made with gas producing vegetables, such as broccoli, cauliflower, onion, etc.  Sweets and Snacks Use in moderation and do not eat large amounts of sweets by themselves. Syrup, honey, jelly and seedless jam  Plain hard candies and plain candies made with allowed ingredients  Molasses  Marshmallows  Other candy made from allowed ingredients  Thin pretzels Jam, marmalade and preserves  Chocolate in any form  Any candy containing nuts, coconut, seeds, peppermint, spearmint or dried or fresh fruit  Popcorn, potato chips, tortilla chips  Soft or hard thick pretzels, such as sourdough  Vegetables Well cooked soft vegetables without seeds or skins, such as asparagus tips, beets, carrots, green and wax beans, chopped spinach, tender canned baby peas, squash and pumpkin Raw vegetables, tomatoes, tomato juice, tomato sauce and V-8 juice  Gas producing vegetables, such as broccoli, Brussel sprouts, cabbage, cauliflower, onions, corn, cucumber, green peppers, rutabagas, turnips, radishes and sauerkraut  Dried beans, peas and lentils  Miscellaneous Salt and spices in moderation  Mustard and vinegar in moderation Fried or highly  seasoned foods  Coconut and seeds  Pickles and olives  Chili sauces, ketchup, barbecue sauce, horseradish, black pepper, chili powder and onion and garlic seasonings  Any other strongly flavored seasoning, condiment, spice or herb not tolerated  Any food not tolerated

## 2021-09-15 ENCOUNTER — Telehealth: Payer: Self-pay | Admitting: Surgery

## 2021-09-15 NOTE — Progress Notes (Signed)
Outpatient Surgical Follow Up  09/15/2021  Wanda Nelson is an 88 y.o. female.   Chief Complaint  Patient presents with   Follow-up    HPI:  Wanda Nelson is an 88 year old very nice lady seen following up for a very large paraesophageal hernia that is causing significant symptoms and interfering with her quality of life daily.  Please note that her mind is pristine.  She experiences significant reflux and significant bloating as well as chest pain related to the paraesophageal hernia. Her symptoms have improved reently w PPI but she still symptomatic. She states that she has easy satiety no significant dysphagia.  She did have a CT scan of the chest to rule out PE that I have personally reviewed there is evidence of very large paraesophageal hernia with the entire stomach within the mediastinum.  There is also evidence of organoaxial rotation.  There is some chronic pulmonary issues.  She also has cough and is very miserable related to the paraesophageal hernia.  SHe is very functional from [place] but she is still 89.  She walks and more recently because of the hernia her quality of life has had significant impairment.  She was also very conscious about a potential operation and she said she could not live with this hernia and she was okay if she will not make it after surgical procedure. SHe had a sternotomy for atrial myxoma and was able to tolerate sternotomy and cardiac bypass. INitially it was started w a right thoracotomy and minimally invasive cardiac approach and related to bleeding complications she was converted to open sternotomy w caridiac bypass. She did surprisingly well. She had completed both EGD as well as barium swallow that I have personally reviewed showing giant paraesophageal hernia.  She also has been evaluated by cardiology who thinks she is optimized preoperatively. She wishes for her family to be present and is thinking about getting her surgery schedule for  early November.  Past Medical History:  Diagnosis Date   Actinic keratosis    Anemia    Basal cell carcinoma 05/17/2021   L upper back 5.0 cm lat to spine - ED&C   COVID-19    05/30/20 had covid 19 pill    Glaucoma    History of blood transfusion    History of hiatal hernia    Hyperlipidemia    Incidental pulmonary nodule, > 32m and < 813m03/12/2018   Noted on CT scan - needs f/u imaging 6 months   s/p resection left atrial myxoma 03/21/2018   Squamous cell carcinoma of skin 11/12/2019   L distal dorsum forearm - ED&C    [facility]    Past Surgical History:  Procedure Laterality Date   ABDOMINAL HYSTERECTOMY  1982   total   BUNIONECTOMY Bilateral    CLIPPING OF ATRIAL APPENDAGE  03/21/2018   Procedure: Clipping Of Atrial Appendage - using an AtriCure 458mlip;  Surgeon: OweRexene AlbertsD;  [facility];;   ESOPHAGOGASTRODUODENOSCOPY (EGD) WITH PROPOFOL N/A 09/06/2021   Procedure: ESOPHAGOGASTRODUODENOSCOPY (EGD) WITH PROPOFOL;  Surgeon: AnnJonathon BellowsD;  [facility];  Service: Gastroenterology;  Laterality: N/A;   EXCISION OF ATRIAL MYXOMA N/A 03/21/2018   Procedure: Excision Of Atrial Myxoma;  Surgeon: OweRexene AlbertsD;  [facility];  Laterality: N/A;   RECTOCELE REPAIR     RIGHT/LEFT HEART CATH AND CORONARY ANGIOGRAPHY N/A 03/19/2018   Procedure: RIGHT/LEFT HEART CATH  AND CORONARY ANGIOGRAPHY;  Surgeon: Wellington Hampshire, MD;  Location: [place] [facility];  Service: Cardiovascular;  Laterality: N/A;   TEE WITHOUT CARDIOVERSION N/A 03/21/2018   Procedure: TRANSESOPHAGEAL ECHOCARDIOGRAM (TEE);  Surgeon: Rexene Alberts, MD;  Location: [place];  Service: Open Heart Surgery;  Laterality: N/A;   TONSILLECTOMY      Family History  Problem Relation Age of Onset   Colon cancer Brother    Cancer Brother        neck tumor cancerous    Esophageal varices Brother    Colon cancer Maternal Uncle     Colon cancer Maternal Grandmother    Kidney disease Brother    Coronary artery disease Brother        with stent, cabg   Breast cancer Neg Hx     Social History:  reports that she has never smoked. She has never used smokeless tobacco. She reports that she does not drink alcohol and does not use drugs.  Allergies:  Allergies  Allergen Reactions   Bee Venom Anaphylaxis and Hives   Codeine Other (See Comments)    Altered mental status Altered mental status   Morphine And Related     Altered mental status   Shellfish Allergy Anaphylaxis and Hives    Mainly shrimp   Bactrim [Sulfamethoxazole-Trimethoprim]     ? Reaction     Norvasc [Amlodipine]     2.5 leg edema    Nitrofurantoin Rash    Medications reviewed.    ROS Full ROS performed and is otherwise negative other than what is stated in HPI   BP (!) 154/71   Pulse 62   Temp 98.3 F (36.8 C)   Ht '5\' 1"'$  (1.549 m)   Wt 118 lb (53.5 kg)   LMP  (LMP Unknown)   SpO2 93%   BMI 22.30 kg/m   Physical Exam Vitals and nursing note reviewed. Exam conducted with a chaperone present.  Constitutional:      General: She is not in acute distress.    Appearance: Normal appearance. She is not ill-appearing, toxic-appearing or diaphoretic.  HENT:     Mouth/Throat:     Pharynx: No oropharyngeal exudate or posterior oropharyngeal erythema.  Eyes:     General: No scleral icterus.       Right eye: No discharge.        Left eye: No discharge.  Neck:     Vascular: No carotid bruit.     Comments: Significant kyphosis but no major significant range of motion issues Cardiovascular:     Rate and Rhythm: Normal rate and regular rhythm.     Heart sounds: No murmur heard.    No friction rub.  Pulmonary:     Effort: Pulmonary effort is normal. No respiratory distress.     Breath sounds: Normal breath sounds. No stridor. No wheezing or rhonchi.  Abdominal:     General: Abdomen is flat. There is no distension.     Palpations:  Abdomen is soft. There is no mass.     Tenderness: There is no abdominal tenderness. There is no guarding or rebound.     Hernia: No hernia is present.  Musculoskeletal:        General: No swelling or tenderness. Normal range of motion.     Cervical back: Normal range of motion and neck supple. No rigidity or tenderness.  Lymphadenopathy:     Cervical: No cervical adenopathy.  Skin:    General: Skin is warm and  dry.     Capillary Refill: Capillary refill takes less than 2 seconds.  Neurological:     General: No focal deficit present.     Mental Status: She is alert and oriented to person, place, and time.  Psychiatric:        Mood and Affect: Mood normal.        Behavior: Behavior normal.        Thought Content: Thought content normal.        Judgment: Judgment normal.       Assessment/Plan: 88 year old female with a large paraesophageal hernia.  She is elderly but very functional and she is extremely aware of her situation.  She wants to pursue further surgical intervention.  She would be be a candidate for robotic paraesophageal hernia repair.  She understands that her situation is difficult because any potential complication could be devastated and she may not make it.  She is fully aware of the situation.  On the other hand her quality of life is suffering. We talked about robotic repair, risks, benefits and possible complications as well as postoperative course including diet modification.  Please note that I spent greater than 40 minutes in this encounter including personally reviewing imaging studies, coordination of her care, counseling the patient and performing a proper documentation   Caroleen Hamman, MD [facility]

## 2021-09-15 NOTE — Telephone Encounter (Signed)
Patient has been advised of Pre-Admission date/time, and Surgery date at [facility].  Surgery Date: 11/14/21 Preadmission Testing Date: 11/03/21 (phone 8a-1p)  Patient has been made aware to call [phone], between 1-3:00pm the day before surgery, to find out what time to arrive for surgery.

## 2021-10-10 ENCOUNTER — Telehealth (INDEPENDENT_AMBULATORY_CARE_PROVIDER_SITE_OTHER): Payer: Medicare Other | Admitting: Family Medicine

## 2021-10-10 ENCOUNTER — Encounter: Payer: Self-pay | Admitting: Family Medicine

## 2021-10-10 DIAGNOSIS — U071 COVID-19: Secondary | ICD-10-CM

## 2021-10-10 MED ORDER — MOLNUPIRAVIR EUA 200MG CAPSULE: 4.0000 | ORAL_CAPSULE | Freq: Two times a day (BID) | ORAL | 0 refills | Status: AC

## 2021-10-10 NOTE — Progress Notes (Signed)
[facility] Telemedicine Visit  Patient consented to have virtual visit and was identified by name and date of birth. Method of visit: Video  Encounter participants: Patient: Wanda Nelson - located at home Provider: Carollee Leitz - located at office Others (if applicable): none  Chief Complaint: COVID symptoms  HPI:  Recently on vacation.  Symptoms started yesterday.  Rhinorrhea, dry cough, sore throat ,chills, and intermittent body aches.  Denies any chest pain, worsening shortness of breath, difficulty breathing, fevers or decrease in appetite. Hydrating well.  Appetite good.  Has had previous COVID vaccines.  Previous COVID infection in May 2022.  Did not take any antivirals previously but is interested in taking medications.  ROS: per HPI  Pertinent PMHx:  Pulmonary Fibrosis HTN   Exam:  Ht '5\' 1"'$  (1.549 m)   Wt 118 lb (53.5 kg)   LMP  (LMP Unknown)   BMI 22.30 kg/m   Respiratory: Speaking in full sentences.  No increased work of breathing, shortness of breath or cough during video visit.  Assessment/Plan:  Positive self-administered antigen test for COVID-19 Home COVID test positive. In no acute respiratory distress. -Start Molnupiravir 800 mg BID x 5 days -Symptomatic management for fever, muscle aches and headaches  -Tylenol 325-500 mg every 6 hours as needed -Cepacol lozenges -Honey tea for sore throat -Stay well hydrated -Rest as needed with frequent repositioning and ambulation.  -Increase activity as soon as tolerated to help with recovery. -COVID precautions per CDC guidelines -Strict return/ED precautions provided    Time spent during visit with patient: 20 minutes

## 2021-10-10 NOTE — Patient Instructions (Addendum)
It was a pleasure meeting you today. Thank you for allowing me to take part in your health care.  Our goals for today as we discussed include:  Symptomatic management for fever, muscle aches and headaches  -Tylenol 325-500 mg every 6 hours as needed -Cepacol lozenges -Honey tea for sore throat  Stay well hydrated Rest as needed with frequent repositioning and ambulation.  Increase activity as soon as tolerated to help with recovery.  Continue wearing masks, hand washing and self isolation until symptom free.  If you have worsening symptoms, especially difficulty breathing please call 911 or have someone take you to the emergency department.    Please follow-up with PCP as scheduled or sooner if needed  If you have any questions or concerns, please do not hesitate to call the office at [phone].  I look forward to our next visit and until then take care and stay safe.  Regards,   Carollee Leitz, MD   [place]    Molnupiravir Capsules What is this medication? MOLNUPIRAVIR (MOL nue PIR a vir) treats mild to moderate COVID-19. It may help people who are at high risk of developing severe illness. This medication works by limiting the spread of the virus in your body. The FDA has allowed the emergency use of this medication. This medicine may be used for other purposes; ask your health care provider or pharmacist if you have questions. COMMON BRAND NAME(S): LAGEVRIO What should I tell my care team before I take this medication? They need to know if you have any of these conditions: Any allergies Any serious illness An unusual or allergic reaction to molnupiravir, other medications, foods, dyes, or preservatives Pregnant or trying to get pregnant Breast-feeding How should I use this medication? Take this medication by mouth with water. Take it as directed on the prescription label at the same time every day. Do not cut, crush, or chew this medication. Swallow  the capsules whole. You can take it with or without food. If it upsets your stomach, take it with food. Take all of it unless your care team tells you to stop it early. Keep taking it even if you think you are better. Talk to your care team about the use of this medication in children. Special care may be needed. Overdosage: If you think you have taken too much of this medicine contact a poison control center or emergency room at once. NOTE: This medicine is only for you. Do not share this medicine with others. What if I miss a dose? If you miss a dose, take it as soon as you can unless it is more than 10 hours late. If it is more than 10 hours late, skip the missed dose. Take the next dose at the normal time. Do not take extra or 2 doses at the same time to make up for the missed dose. What may interact with this medication? Interactions have not been studied. This list may not describe all possible interactions. Give your health care provider a list of all the medicines, herbs, non-prescription drugs, or dietary supplements you use. Also tell them if you smoke, drink alcohol, or use illegal drugs. Some items may interact with your medicine. What should I watch for while using this medication? Your condition will be monitored carefully while you are receiving this medication. Visit your care team for regular checkups. Tell your care team if your symptoms do not start to get better or if they get worse. Do not  become pregnant while taking this medication. You may need a pregnancy test before starting this medication. Women must use a reliable form of birth control while taking this medication and for 4 days after stopping the medication. Women should inform their care team if they wish to become pregnant or think they might be pregnant. Men should not father a child while taking this medication and for 3 months after stopping it. There is potential for serious harm to an unborn child. Talk to your care  team for more information. Do not breast-feed an infant while taking this medication and for 4 days after stopping the medication. What side effects may I notice from receiving this medication? Side effects that you should report to your care team as soon as possible: Allergic reactions--skin rash, itching, hives, swelling of the face, lips, tongue, or throat Side effects that usually do not require medical attention (report these to your care team if they continue or are bothersome): Diarrhea Dizziness Nausea This list may not describe all possible side effects. Call your doctor for medical advice about side effects. You may report side effects to FDA at 1-800-FDA-1088. Where should I keep my medication? Keep out of the reach of children and pets. Store at room temperature between 20 and 25 degrees C (68 and 77 degrees F). Get rid of any unused medication after the expiration date. To get rid of medications that are no longer needed or have expired: Take the medication to a medication take-back program. Check with your pharmacy or law enforcement to find a location. If you cannot return the medication, check the label or package insert to see if the medication should be thrown out in the garbage or flushed down the toilet. If you are not sure, ask your care team. If it is safe to put it in the trash, take the medication out of the container. Mix the medication with cat litter, dirt, coffee grounds, or other unwanted substance. Seal the mixture in a bag or container. Put it in the trash. NOTE: This sheet is a summary. It may not cover all possible information. If you have questions about this medicine, talk to your doctor, pharmacist, or health care provider.  2023 Elsevier/Gold Standard (2020-01-04 00:00:00)

## 2021-10-12 ENCOUNTER — Ambulatory Visit: Payer: Medicare Other | Admitting: Podiatry

## 2021-10-17 ENCOUNTER — Telehealth (INDEPENDENT_AMBULATORY_CARE_PROVIDER_SITE_OTHER): Payer: Medicare Other | Admitting: Internal Medicine

## 2021-10-17 ENCOUNTER — Encounter: Payer: Self-pay | Admitting: Internal Medicine

## 2021-10-17 VITALS — BP 167/68 | Ht 61.0 in | Wt 118.0 lb

## 2021-10-17 DIAGNOSIS — E785 Hyperlipidemia, unspecified: Secondary | ICD-10-CM | POA: Diagnosis not present

## 2021-10-17 DIAGNOSIS — J309 Allergic rhinitis, unspecified: Secondary | ICD-10-CM

## 2021-10-17 DIAGNOSIS — I10 Essential (primary) hypertension: Secondary | ICD-10-CM | POA: Diagnosis not present

## 2021-10-17 DIAGNOSIS — D509 Iron deficiency anemia, unspecified: Secondary | ICD-10-CM

## 2021-10-17 DIAGNOSIS — U071 COVID-19: Secondary | ICD-10-CM

## 2021-10-17 DIAGNOSIS — K449 Diaphragmatic hernia without obstruction or gangrene: Secondary | ICD-10-CM | POA: Diagnosis not present

## 2021-10-17 DIAGNOSIS — Z2911 Encounter for prophylactic immunotherapy for respiratory syncytial virus (RSV): Secondary | ICD-10-CM | POA: Diagnosis not present

## 2021-10-17 MED ORDER — LOSARTAN POTASSIUM 25 MG PO TABS: 12.5000 mg | ORAL_TABLET | Freq: Two times a day (BID) | ORAL | 3 refills | Status: DC

## 2021-10-17 MED ORDER — LORATADINE 10 MG PO TABS: 10.0000 mg | ORAL_TABLET | Freq: Every day | ORAL | 3 refills | Status: DC

## 2021-10-17 MED ORDER — RSVPREF3 VAC RECOMB ADJUVANTED 120 MCG/0.5ML IM SUSR: 0.5000 mL | Freq: Once | INTRAMUSCULAR | 0 refills | Status: AC

## 2021-10-17 MED ORDER — ATORVASTATIN CALCIUM 20 MG PO TABS
20.0000 mg | ORAL_TABLET | Freq: Every day | ORAL | 3 refills | Status: DC
Start: 2021-10-17 — End: 2022-11-22

## 2021-10-17 MED ORDER — FERROUS SULFATE 325 (65 FE) MG PO TABS: 325.0000 mg | ORAL_TABLET | Freq: Every day | ORAL | 3 refills | Status: DC

## 2021-10-17 NOTE — Patient Instructions (Signed)
If your blood pressure is >140/>80 please take losartan 25 mg 1 pill daily normally you take 12.5 2 x per day but if needed you can take 25 mg 2x per day of losartan  Your goal blood pressure is <140/<80  If needing prescription strength medication we will need to make an appointment with a provider.  These are over the counter medication options:  Mucinex dm green label for cough or robitussin DM  Multivitamin or below vitamins  Vitamin C 1000 mg daily.  Vitamin D3 4000 Iu (units) daily.  Zinc 100 mg daily.  Quercetin 250-500 mg 2 times per day   Elderberry  Oil of oregano  cepacol or chloroseptic spray Warm salt water gargles +hydrogen peroxide Sugar free cough drops  Warm tea with honey and lemon  Hydration  Try to eat though you dont feel like it   Tylenol or Advil  Nasal saline and Flonase 2 sprays nasal congestion  If sneezing/runny nose over the counter allergy pill claritin,allegra, zyrtec, xyzal Quarantine x 10-14 days 14 days preferred   Monitor pulse oximeter, buy from [facility] if oxygen is less than 90 please go to the hospital.        Are you feeling really sick? Shortness of breath, cough, chest pain?, dizziness? Confusion   If so let me know  If worsening, go to hospital or [facility] clinic Urgent care for further treatment.

## 2021-10-17 NOTE — Progress Notes (Signed)
Virtual Visit via Video Note  I connected with Wanda Nelson   on 10/17/21 at  9:30 AM EDT by a video enabled telemedicine application and verified that I am speaking with the correct person using two identifiers.  Location patient: [place] Location provider:work or home office Persons participating in the virtual visit: patient, provider  I discussed the limitations and requested verbal permission for telemedicine visit. The patient expressed understanding and agreed to proceed.   HPI:  Acute telemedicine visit for : Covid 19 10/10/21 txd molnupiravir still testing faint + but feeling better overall x the last 4-5 days still not able to walk her 3 miles   Hiatal hernia have sob appt for surgery 11/14/21 with Dr. Dahlia Byes to repair disc anemia may improve after hiatal hernia repair   BP variable on losartan 12.5 mg bid BP variable and high sbp 160s-170s   -Pertinent past medical history: see below -Pertinent medication allergies: Allergies  Allergen Reactions   Bee Venom Anaphylaxis and Hives   Codeine Other (See Comments)    Altered mental status Altered mental status   Morphine And Related     Altered mental status   Shellfish Allergy Anaphylaxis and Hives    Mainly shrimp   Bactrim [Sulfamethoxazole-Trimethoprim]     ? Reaction     Norvasc [Amlodipine]     2.5 leg edema    Nitrofurantoin Rash   -COVID-19 vaccine status:  Immunization History  Administered Date(s) Administered   Influenza, High Dose Seasonal PF 10/27/2019, 10/20/2020   Influenza,inj,Quad PF,6+ Mos 09/22/2015   Influenza-Unspecified 10/08/2017, 10/27/2018   Moderna Sars-Covid-2 Vaccination 01/23/2019, 02/20/2019, 11/24/2019, 05/07/2020   Pfizer Covid-19 Vaccine Bivalent Booster 75yr & up 09/29/2020   Pneumococcal Conjugate-13 03/24/2014   Pneumococcal-Unspecified 11/17/2014   Tdap 05/02/2020   Zoster Recombinat (Shingrix) 09/30/2017, 12/23/2017     ROS: See pertinent positives and negatives per  HPI.  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   Squamous cell carcinoma of skin 11/12/2019   L distal dorsum forearm - ED&C    [facility]   Past Surgical History:  Procedure Laterality Date   ABDOMINAL HYSTERECTOMY  1982   total   BUNIONECTOMY Bilateral    CLIPPING OF ATRIAL APPENDAGE  03/21/2018   Procedure: Clipping Of Atrial Appendage - using an AtriCure 54m32mip;  Surgeon: OwenRexene Alberts;  [facility];;   ESOPHAGOGASTRODUODENOSCOPY (EGD) WITH PROPOFOL N/A 09/06/2021   Procedure: ESOPHAGOGASTRODUODENOSCOPY (EGD) WITH PROPOFOL;  Surgeon: AnnaJonathon Bellows;  [facility];  Service: Gastroenterology;  Laterality: N/A;   EXCISION OF ATRIAL MYXOMA N/A 03/21/2018   Procedure: Excision Of Atrial Myxoma;  Surgeon: OwenRexene Alberts;  [facility];  Laterality: N/A;   RECTOCELE REPAIR     RIGHT/LEFT HEART CATH AND CORONARY ANGIOGRAPHY N/A 03/19/2018   Procedure: RIGHT/LEFT HEART CATH AND CORONARY ANGIOGRAPHY;  Surgeon: AridWellington Hampshire;  Location: [place] [facility];  Service: Cardiovascular;  Laterality: N/A;   TEE WITHOUT CARDIOVERSION N/A 03/21/2018   Procedure: TRANSESOPHAGEAL ECHOCARDIOGRAM (TEE);  Surgeon: Rexene Alberts, MD;  Location: [facility];  Service: Open Heart Surgery;  Laterality: N/A;   TONSILLECTOMY       Current Outpatient Medications:    acetaminophen (TYLENOL) 325 MG tablet, Take 2 tablets (650 mg total) by mouth every 6 (six) hours as needed for mild pain (or Fever >/= 101)., Disp:  , Rfl:    Ascorbic Acid (VITAMIN C PO), Take by mouth daily at 12 noon., Disp: , Rfl:    aspirin EC 81 MG tablet, Take 1 tablet (81 mg total) by mouth daily., Disp: 90 tablet, Rfl: 3   Calcium Carbonate-Vitamin D 500-125 MG-UNIT TABS, Take 1 tablet by mouth daily., Disp: , Rfl:    cholecalciferol (VITAMIN D) 1000 units tablet, Take 1,000 Units by mouth daily., Disp: , Rfl:    Flaxseed, Linseed, (FLAX SEED OIL) 1300 MG CAPS, Take 1,100 mg by mouth daily. , Disp: , Rfl:    ipratropium-albuterol (DUONEB) 0.5-2.5 (3) MG/3ML SOLN, INHALE THE CONTENTS OF ONE (1) VIAL VIA NEBULIZATION EVERY 6 HOURS AS NEEDED, Disp: 360 mL, Rfl: 0   Multiple Vitamin (MULTIVITAMIN) tablet, Take 1 tablet by mouth daily., Disp: , Rfl:    Omega-3 Fatty Acids (FISH OIL) 1200 MG CAPS, Take by mouth daily at 8 pm., Disp: , Rfl:    pantoprazole (PROTONIX) 40 MG tablet, Take 40 mg by mouth daily., Disp: , Rfl:    RSV vaccine recomb adjuvanted (AREXVY) 120 MCG/0.5ML injection, Inject 0.5 mLs into the muscle once for 1 dose., Disp: 0.5 mL, Rfl: 0   atorvastatin (LIPITOR) 20 MG tablet, Take 1 tablet (20 mg total) by mouth daily at 6 PM., Disp: 90 tablet, Rfl: 3   ferrous sulfate (FEROSUL) 325 (65 FE) MG tablet, Take 1 tablet (325 mg total) by mouth daily with breakfast. With vitamin C 500 mg daily, Disp: 90 tablet, Rfl: 3   loratadine (CLARITIN) 10 MG tablet, Take 1 tablet (10 mg total) by mouth daily. As needed at night, Disp: 90 tablet, Rfl: 3   losartan (COZAAR) 25 MG tablet, Take 0.5-1 tablets (12.5-25 mg total) by mouth 2 (two) times daily. If BP >140/>80 take losartan 25 mg bid, Disp: 180 tablet, Rfl: 3  EXAM:  VITALS per patient if applicable:  GENERAL: alert, oriented, appears well and in no acute distress  HEENT: atraumatic, conjunttiva clear, no obvious abnormalities on inspection of external nose and ears  NECK: normal movements of the head and neck  LUNGS: on inspection no signs of respiratory distress, breathing  rate appears normal, no obvious gross SOB, gasping or wheezing  CV: no obvious cyanosis  MS: moves all visible extremities without noticeable abnormality  PSYCH/NEURO: pleasant and cooperative, no obvious depression or anxiety, speech and thought processing grossly intact  ASSESSMENT AND PLAN:  Discussed the following assessment and plan:  COVID-19 Doing better    Hyperlipidemia, unspecified hyperlipidemia type - Plan: atorvastatin (LIPITOR) 20 MG tablet  Iron deficiency anemia, unspecified iron deficiency anemia type - Plan: ferrous sulfate (FEROSUL) 325 (65 FE) MG tablet She may not need iron after surgery to repair hiatal hernia as this can cause iron def anemia  Iron levels and cbc with need to be checked in the future   Allergic rhinitis, unspecified seasonality, unspecified trigger - Plan: loratadine (CLARITIN) 10 MG tablet  Hypertension, unspecified type - Plan: losartan (COZAAR) on  12.5 mg bid but due to BP running high rec she take 25 mg qd to bid. For now start with 25 mg in the am and 12.5 mg qhs   Hiatal hernia surgery 11/14/21   HM Flu shot utd will get 10/24/21 where she lives  Mailed Rx RSV vaccine Tdap 05/02/20  shingrix 2/2  covid 19 3/3 moderna consider 4th dose  Had 11/162016 prevnar [place] [facility]  pna 23 ? If had no record from [place] will need in future pt to check date of this   Out of age window pap Mammogram had in 2016 last consider b/l dx mammo and [place] will disc with pt 09/2018 right breast red and sore since heart surgery 03/2018  12/02/18 negative mammo and [place]  mammogram 01/27/20 negative, 03/09/21 neg  ordered 2024   Colonoscopy/EGD in 2014 [facility] colonoscopy normal EGD large H/H per pt   DEXA 2015/2016 [facility] normal per pt   Skin- Dr. Nehemiah Massed  appt sch 11/2019 per pt seen and UTD 03/2021    Continue walking 3 miles daily and healthy eating   -we discussed possible serious and likely etiologies, options for  evaluation and workup, limitations of telemedicine visit vs in person visit, treatment, treatment risks and precautions. Pt is agreeable to treatment via telemedicine at this moment.   I discussed the assessment and treatment plan with the patient. The patient was provided an opportunity to ask questions and all were answered. The patient agreed with the plan and demonstrated an understanding of the instructions.    Time spent 20 min Delorise Jackson, MD

## 2021-10-18 ENCOUNTER — Encounter: Payer: Self-pay | Admitting: Family Medicine

## 2021-10-18 DIAGNOSIS — U071 COVID-19: Secondary | ICD-10-CM

## 2021-10-18 HISTORY — DX: COVID-19: U07.1

## 2021-10-18 NOTE — Assessment & Plan Note (Signed)
Home COVID test positive. In no acute respiratory distress. -Start Molnupiravir 800 mg BID x 5 days -Symptomatic management for fever, muscle aches and headaches  -Tylenol 325-500 mg every 6 hours as needed -Cepacol lozenges -Honey tea for sore throat -Stay well hydrated -Rest as needed with frequent repositioning and ambulation.  -Increase activity as soon as tolerated to help with recovery. -COVID precautions per CDC guidelines -Strict return/ED precautions provided

## 2021-10-23 ENCOUNTER — Ambulatory Visit (INDEPENDENT_AMBULATORY_CARE_PROVIDER_SITE_OTHER): Payer: Medicare Other | Admitting: Dermatology

## 2021-10-23 DIAGNOSIS — L578 Other skin changes due to chronic exposure to nonionizing radiation: Secondary | ICD-10-CM | POA: Diagnosis not present

## 2021-10-23 DIAGNOSIS — Z85828 Personal history of other malignant neoplasm of skin: Secondary | ICD-10-CM

## 2021-10-23 DIAGNOSIS — L905 Scar conditions and fibrosis of skin: Secondary | ICD-10-CM

## 2021-10-23 NOTE — Patient Instructions (Signed)
Due to recent changes in healthcare laws, you may see results of your pathology and/or laboratory studies on MyChart before the doctors have had a chance to review them. We understand that in some cases there may be results that are confusing or concerning to you. Please understand that not all results are received at the same time and often the doctors may need to interpret multiple results in order to provide you with the best plan of care or course of treatment. Therefore, we ask that you please give us 2 business days to thoroughly review all your results before contacting the office for clarification. Should we see a critical lab result, you will be contacted sooner.   If You Need Anything After Your Visit  If you have any questions or concerns for your doctor, please call our main line at [phone] and press option 4 to reach your doctor's medical assistant. If no one answers, please leave a voicemail as directed and we will return your call as soon as possible. Messages left after 4 pm will be answered the following business day.   You may also send us a message via MyChart. We typically respond to MyChart messages within 1-2 business days.  For prescription refills, please ask your pharmacy to contact our office. Our fax number is [phone].  If you have an urgent issue when the clinic is closed that cannot wait until the next business day, you can page your doctor at the number below.    Please note that while we do our best to be available for urgent issues outside of office hours, we are not available 24/7.   If you have an urgent issue and are unable to reach us, you may choose to seek medical care at your doctor's office, retail clinic, urgent care center, or emergency room.  If you have a medical emergency, please immediately call 911 or go to the emergency department.  Pager Numbers  - Dr. Kowalski: [phone]  - Dr. Moye: [phone]  - Dr. Stewart:  [phone]  In the event of inclement weather, please call our main line at [phone] for an update on the status of any delays or closures.  Dermatology Medication Tips: Please keep the boxes that topical medications come in in order to help keep track of the instructions about where and how to use these. Pharmacies typically print the medication instructions only on the boxes and not directly on the medication tubes.   If your medication is too expensive, please contact our office at [phone] option 4 or send us a message through MyChart.   We are unable to tell what your co-pay for medications will be in advance as this is different depending on your insurance coverage. However, we may be able to find a substitute medication at lower cost or fill out paperwork to get insurance to cover a needed medication.   If a prior authorization is required to get your medication covered by your insurance company, please allow us 1-2 business days to complete this process.  Drug prices often vary depending on where the prescription is filled and some pharmacies may offer cheaper prices.  The website www.goodrx.com contains coupons for medications through different pharmacies. The prices here do not account for what the cost may be with help from insurance (it may be cheaper with your insurance), but the website can give you the price if you did not use any insurance.  - You can print the associated coupon and take it with   your prescription to the pharmacy.  - You may also stop by our office during regular business hours and pick up a GoodRx coupon card.  - If you need your prescription sent electronically to a different pharmacy, notify our office through [place] MyChart or by phone at [phone] option 4.     Si Usted Necesita Algo Despus de Su Visita  Tambin puede enviarnos un mensaje a travs de MyChart. Por lo general respondemos a los mensajes de MyChart en el transcurso de 1 a 2  das hbiles.  Para renovar recetas, por favor pida a su farmacia que se ponga en contacto con nuestra oficina. Nuestro nmero de fax es el [phone].  Si tiene un asunto urgente cuando la clnica est cerrada y que no puede esperar hasta el siguiente da hbil, puede llamar/localizar a su doctor(a) al nmero que aparece a continuacin.   Por favor, tenga en cuenta que aunque hacemos todo lo posible para estar disponibles para asuntos urgentes fuera del horario de oficina, no estamos disponibles las 24 horas del da, los 7 das de la semana.   Si tiene un problema urgente y no puede comunicarse con nosotros, puede optar por buscar atencin mdica  en el consultorio de su doctor(a), en una clnica privada, en un centro de atencin urgente o en una sala de emergencias.  Si tiene una emergencia mdica, por favor llame inmediatamente al 911 o vaya a la sala de emergencias.  Nmeros de bper  - Dr. Kowalski: [phone]  - Dra. Moye: [phone]  - Dra. Stewart: [phone]  En caso de inclemencias del tiempo, por favor llame a nuestra lnea principal al [phone] para una actualizacin sobre el estado de cualquier retraso o cierre.  Consejos para la medicacin en dermatologa: Por favor, guarde las cajas en las que vienen los medicamentos de uso tpico para ayudarle a seguir las instrucciones sobre dnde y cmo usarlos. Las farmacias generalmente imprimen las instrucciones del medicamento slo en las cajas y no directamente en los tubos del medicamento.   Si su medicamento es muy caro, por favor, pngase en contacto con nuestra oficina llamando al [phone] y presione la opcin 4 o envenos un mensaje a travs de MyChart.   No podemos decirle cul ser su copago por los medicamentos por adelantado ya que esto es diferente dependiendo de la cobertura de su seguro. Sin embargo, es posible que podamos encontrar un medicamento sustituto a menor costo o llenar un formulario para que el  seguro cubra el medicamento que se considera necesario.   Si se requiere una autorizacin previa para que su compaa de seguros cubra su medicamento, por favor permtanos de 1 a 2 das hbiles para completar este proceso.  Los precios de los medicamentos varan con frecuencia dependiendo del lugar de dnde se surte la receta y alguna farmacias pueden ofrecer precios ms baratos.  El sitio web www.goodrx.com tiene cupones para medicamentos de diferentes farmacias. Los precios aqu no tienen en cuenta lo que podra costar con la ayuda del seguro (puede ser ms barato con su seguro), pero el sitio web puede darle el precio si no utiliz ningn seguro.  - Puede imprimir el cupn correspondiente y llevarlo con su receta a la farmacia.  - Tambin puede pasar por nuestra oficina durante el horario de atencin regular y recoger una tarjeta de cupones de GoodRx.  - Si necesita que su receta se enve electrnicamente a una farmacia diferente, informe a nuestra oficina a travs de MyChart de [facility]   o por telfono llamando al [phone] y presione la opcin 4.  

## 2021-10-23 NOTE — Progress Notes (Addendum)
   Follow-Up Visit   Subjective  Wanda Nelson is a 88 y.o. female who presents for the following: Other (Spot on back x several months that is itchy). The patient has spots, moles and lesions to be evaluated, some may be new or changing and the patient has concerns that these could be cancer.  The following portions of the chart were reviewed this encounter and updated as appropriate:   Tobacco  Allergies  Meds  Problems  Med Hx  Surg Hx  Fam Hx     Review of Systems:  No other skin or systemic complaints except as noted in HPI or Assessment and Plan.  Objective  Well appearing patient in no apparent distress; mood and affect are within normal limits.  A focused examination was performed including back. Relevant physical exam findings are noted in the Assessment and Plan.  Left Upper Back Hypertrophic scar   Assessment & Plan   Scar in site of previous skin cancer = BCC Hypertrophic and symptomatic with pruritus Left Upper Back  Intralesional injection - Left Upper Back Location: Left upper back  Informed Consent: Discussed risks (infection, pain, bleeding, bruising, thinning of the skin, loss of skin pigment, lack of resolution, and recurrence of lesion) and benefits of the procedure, as well as the alternatives. Informed consent was obtained. Preparation: The area was prepared a standard fashion.  Anesthesia:none  Procedure Details: An intralesional injection was performed with Kenalog 5 mg/cc. Marland Kitchen015 cc in total were injected. NDC# [id number]  Total number of injections: 4  Plan: The patient was instructed on post-op care. Recommend OTC analgesia as needed for pain.  Actinic Damage - chronic, secondary to cumulative UV radiation exposure/sun exposure over time - diffuse scaly erythematous macules with underlying dyspigmentation - Recommend daily broad spectrum sunscreen SPF 30+ to sun-exposed areas, reapply every 2 hours as needed.  - Recommend staying  in the shade or wearing long sleeves, sun glasses (UVA+UVB protection) and wide brim hats (4-inch brim around the entire circumference of the hat). - Call for new or changing lesions.  Return for Follow up as scheduled.  I, Ashok Cordia, CMA, am acting as scribe for Sarina Ser, MD . Documentation: I have reviewed the above documentation for accuracy and completeness, and I agree with the above.  Sarina Ser, MD

## 2021-10-24 DIAGNOSIS — Z23 Encounter for immunization: Secondary | ICD-10-CM | POA: Diagnosis not present

## 2021-10-25 ENCOUNTER — Ambulatory Visit (INDEPENDENT_AMBULATORY_CARE_PROVIDER_SITE_OTHER): Payer: Medicare Other | Admitting: Podiatry

## 2021-10-25 DIAGNOSIS — M79674 Pain in right toe(s): Secondary | ICD-10-CM | POA: Diagnosis not present

## 2021-10-25 DIAGNOSIS — M79675 Pain in left toe(s): Secondary | ICD-10-CM

## 2021-10-25 DIAGNOSIS — B351 Tinea unguium: Secondary | ICD-10-CM

## 2021-10-25 NOTE — Progress Notes (Signed)
  Subjective:  Patient ID: Wanda Nelson, female    DOB: January 07, 1932,  MRN: [id number]  Chief Complaint  Patient presents with   Nail Problem    Thick painful toenails, 3 month follow up     88 y.o. female presents with the above complaint. History confirmed with patient.  She had COVID at her last scheduled appointment had to reschedule  Objective:  Physical Exam: warm, good capillary refill, no trophic changes or ulcerative lesions, normal DP and PT pulses, and normal sensory exam. Left Foot: dystrophic yellowed discolored nail plates with subungual debris Right Foot: dystrophic yellowed discolored nail plates with subungual debris  Assessment:   1. Pain due to onychomycosis of toenails of both feet      Plan:  Patient was evaluated and treated and all questions answered.  Discussed the etiology and treatment options for the condition in detail with the patient. Educated patient on the topical and oral treatment options for mycotic nails. Recommended debridement of the nails today. Sharp and mechanical debridement performed of all painful and mycotic nails today. Nails debrided in length and thickness using a nail nipper to level of comfort. Discussed treatment options including appropriate shoe gear. Follow up as needed for painful nails.    Return in about 3 months (around 01/25/2022) for painful nails.

## 2021-10-26 ENCOUNTER — Encounter: Payer: Self-pay | Admitting: Family Medicine

## 2021-10-31 ENCOUNTER — Encounter: Payer: Self-pay | Admitting: Family Medicine

## 2021-11-01 NOTE — Addendum Note (Signed)
Addended by: Caroleen Hamman F on: 11/01/2021 10:22 AM   Modules accepted: Orders

## 2021-11-03 ENCOUNTER — Other Ambulatory Visit: Payer: Self-pay

## 2021-11-03 ENCOUNTER — Encounter
Admission: RE | Admit: 2021-11-03 | Discharge: 2021-11-03 | Disposition: A | Discharge status: Home / Self Care | Payer: Medicare Other | Source: Ambulatory Visit | Attending: Surgery | Admitting: Surgery

## 2021-11-03 ENCOUNTER — Encounter: Payer: Self-pay | Admitting: Urgent Care

## 2021-11-03 DIAGNOSIS — Z01812 Encounter for preprocedural laboratory examination: Secondary | ICD-10-CM

## 2021-11-03 DIAGNOSIS — Z01818 Encounter for other preprocedural examination: Secondary | ICD-10-CM | POA: Insufficient documentation

## 2021-11-03 DIAGNOSIS — I10 Essential (primary) hypertension: Secondary | ICD-10-CM | POA: Insufficient documentation

## 2021-11-03 HISTORY — DX: Dyspnea, unspecified: R06.00

## 2021-11-03 HISTORY — DX: Gastro-esophageal reflux disease without esophagitis: K21.9

## 2021-11-03 HISTORY — DX: Essential (primary) hypertension: I10

## 2021-11-03 LAB — CBC
HCT: 42.2 % (ref 36.0–46.0)
Hemoglobin: 13.9 g/dL (ref 12.0–15.0)
MCH: 30.6 pg (ref 26.0–34.0)
MCHC: 32.9 g/dL (ref 30.0–36.0)
MCV: 93 fL (ref 80.0–100.0)
Platelets: 175 10*3/uL (ref 150–400)
RBC: 4.54 MIL/uL (ref 3.87–5.11)
RDW: 14.9 % (ref 11.5–15.5)
WBC: 7.7 10*3/uL (ref 4.0–10.5)
nRBC: 0 % (ref 0.0–0.2)

## 2021-11-03 LAB — BASIC METABOLIC PANEL
Anion gap: 7 (ref 5–15)
BUN: 21 mg/dL (ref 8–23)
CO2: 24 mmol/L (ref 22–32)
Calcium: 9.6 mg/dL (ref 8.9–10.3)
Chloride: 105 mmol/L (ref 98–111)
Creatinine, Ser: 0.71 mg/dL (ref 0.44–1.00)
GFR, Estimated: >60 mL/min (ref >60)
Glucose, Bld: 72 mg/dL (ref 70–99)
Potassium: 4 mmol/L (ref 3.5–5.1)
Sodium: 136 mmol/L (ref 135–145)

## 2021-11-03 NOTE — Patient Instructions (Addendum)
Your procedure is scheduled on: 11/14/21 - Tuesday Report to the Registration Desk on the 1st floor of the [facility]. To find out your arrival time, please call [phone] between 1PM - 3PM on: 11/13/21 - Monday If your arrival time is 6:00 am, do not arrive prior to that time as the [facility] entrance doors do not open until 6:00 am.  REMEMBER: Instructions that are not followed completely may result in serious medical risk, up to and including death; or upon the discretion of your surgeon and anesthesiologist your surgery may need to be rescheduled.  Do not eat food or drink any fluids after midnight the night before surgery.  No gum chewing, lozengers or hard candies.  TAKE THESE MEDICATIONS THE MORNING OF SURGERY WITH A SIP OF WATER:  - pantoprazole (PROTONIX) 40 MG tablet  Hold Aspirin 5 days prior to your surgery.  STOP One week prior to surgery: Stop Anti-inflammatories (NSAIDS) such as Advil, Aleve, Ibuprofen, Motrin, Naproxen, Naprosyn and Aspirin based products such as Excedrin, Goodys Powder, BC Powder.  Stop ANY OVER THE COUNTER supplements until after surgery beginning 11/08/21.  You may however, continue to take Tylenol if needed for pain up until the day of surgery.  No Alcohol for 24 hours before or after surgery.  No Smoking including e-cigarettes for 24 hours prior to surgery.  No chewable tobacco products for at least 6 hours prior to surgery.  No nicotine patches on the day of surgery.  Do not use any "recreational" drugs for at least a week prior to your surgery.  Please be advised that the combination of cocaine and anesthesia may have negative outcomes, up to and including death. If you test positive for cocaine, your surgery will be cancelled.  On the morning of surgery brush your teeth with toothpaste and water, you may rinse your mouth with mouthwash if you wish. Do not swallow any toothpaste or mouthwash.  Use CHG Soap or wipes as directed on  instruction sheet.  Do not wear jewelry, make-up, hairpins, clips or nail polish.  Do not wear lotions, powders, or perfumes.   Do not shave body from the neck down 48 hours prior to surgery just in case you cut yourself which could leave a site for infection.  Also, freshly shaved skin may become irritated if using the CHG soap.  Contact lenses, hearing aids and dentures may not be worn into surgery.  Do not bring valuables to the hospital. [facility] is not responsible for any missing/lost belongings or valuables.   Notify your doctor if there is any change in your medical condition (cold, fever, infection).  Wear comfortable clothing (specific to your surgery type) to the hospital.  After surgery, you can help prevent lung complications by doing breathing exercises.  Take deep breaths and cough every 1-2 hours. Your doctor may order a device called an Incentive Spirometer to help you take deep breaths. When coughing or sneezing, hold a pillow firmly against your incision with both hands. This is called "splinting." Doing this helps protect your incision. It also decreases belly discomfort.  If you are being admitted to the hospital overnight, leave your suitcase in the car. After surgery it may be brought to your room.  If you are being discharged the day of surgery, you will not be allowed to drive home. You will need a responsible adult (18 years or older) to drive you home and stay with you that night.   If you are taking  public transportation, you will need to have a responsible adult (18 years or older) with you. Please confirm with your physician that it is acceptable to use public transportation.   Please call the [facility] Dept. at [phone] if you have any questions about these instructions.  Surgery Visitation Policy:  Patients undergoing a surgery or procedure may have two family members or support persons with them as long as the person is not  COVID-19 positive or experiencing its symptoms.   Inpatient Visitation:    Visiting hours are 7 a.m. to 8 p.m. Up to four visitors are allowed at one time in a patient room, including children. The visitors may rotate out with other people during the day. One designated support person (adult) may remain overnight.

## 2021-11-04 ENCOUNTER — Encounter: Payer: Self-pay | Admitting: Dermatology

## 2021-11-06 ENCOUNTER — Ambulatory Visit (INDEPENDENT_AMBULATORY_CARE_PROVIDER_SITE_OTHER): Payer: Medicare Other | Admitting: Surgery

## 2021-11-06 ENCOUNTER — Encounter: Payer: Self-pay | Admitting: Surgery

## 2021-11-06 VITALS — BP 165/74 | HR 80 | Temp 97.8°F | Wt 119.4 lb

## 2021-11-06 DIAGNOSIS — K449 Diaphragmatic hernia without obstruction or gangrene: Secondary | ICD-10-CM

## 2021-11-06 NOTE — Patient Instructions (Signed)
If you have any concerns or questions, please feel free to call our office.   Laparoscopic Nissen Fundoplication, Care After The following information offers guidance on how to care for yourself after your procedure. Your health care provider may also give you more specific instructions. If you have problems or questions, contact your health care provider. What can I expect after the procedure? After the procedure, it is common to have: Trouble swallowing (dysphagia). Discomfort when you swallow. Soreness in your abdomen. Bloating. Follow these instructions at home: Medicines Take over-the-counter and prescription medicines only as told by your health care provider. Ask your health care provider or pharmacist if you can crush any pill that you are taking. Take only liquid medicines as told. Ask your health care provider if the medicine prescribed to you requires you to avoid driving or using machinery. Incision care  Follow instructions from your health care provider about how to take care of your incisions. Make sure you: Wash your hands with soap and water for at least 20 seconds before and after you change your bandage (dressing). If soap and water are not available, use hand sanitizer. Change your dressing as told by your health care provider. Leave stitches (sutures), skin glue, or adhesive strips in place. These skin closures may need to stay in place for 2 weeks or longer. If adhesive strip edges start to loosen and curl up, you may trim the loose edges. Do not remove adhesive strips completely unless your health care provider tells you to do that. Check your incision area every day for signs of infection. Check for: Redness, swelling, or pain. Fluid or blood. Warmth. Pus or a bad smell. Eating and drinking  Follow instructions from your health care provider about eating or drinking restrictions. Follow these instructions carefully. You may need to follow a liquid-only diet for 2  weeks, followed by a diet of soft foods for 2 weeks. You should eat slow, take small bites, and chew food carefully. You should eat or drink in an upright position. You should return to your usual diet gradually. Drink enough fluid to keep your urine pale yellow. Activity  If you were given a sedative during the procedure, it can affect you for several hours. Do not drive or operate machinery until your health care provider says that it is safe. Rest as told by your health care provider. Avoid sitting for a long time without moving. Get up to take short walks every 1-2 hours. This is important to improve blood flow and breathing. Do not lift anything that is heavier than 10 lb (4.5 kg), or the limit that you are told, until your health care provider says that it is safe. Avoid activities that take a lot of effort. Ask your health care provider what activities are safe for you. Ask when you can: Return to sexual activity. Drive. Go back to work. Return to your normal activities as told by your health care provider. General instructions Do not take baths, swim, or use a hot tub until your health care provider approves. Ask your health care provider if you may take showers. You may only be allowed to take sponge baths. Do not use any products that contain nicotine or tobacco. These products include cigarettes, chewing tobacco, and vaping devices, such as e-cigarettes. These can delay healing. If you need help quitting, ask your health care provider. Keep all follow-up visits. This is important. Contact a health care provider if: You have chills or a fever. You  have trouble swallowing. You have painful bloating. You have persistent heartburn. You have pain that does not go away with medicine. You have frequent nausea or vomiting. Your incision opens up. You have any of these signs of infection: Redness, swelling, or pain around your incision. Fluid or blood coming from your  incision. Warmth coming from your incision. Pus or a bad smell coming from your incision. A fever. Get help right away if: You have severe pain or severe bloating. You are unable to swallow. You have vomiting that does not stop. You have blood in your vomit. You have trouble breathing. These symptoms may represent a serious problem that is an emergency. Do not wait to see if the symptoms will go away. Get medical help right away. Call your local emergency services (911 in the [place] Do not drive yourself to the hospital. Summary After the procedure, it is common to have trouble swallowing or have discomfort when you swallow. Follow instructions from your health care provider about eating or drinking restrictions. You may need to follow a liquid-only diet for 2 weeks, followed by a diet of soft foods for 2 weeks. Return to your normal activities as told by your health care provider. Keep all follow-up visits as told by your health care provider. This is important. This information is not intended to replace advice given to you by your health care provider. Make sure you discuss any questions you have with your health care provider. Document Revised: 07/12/2019 Document Reviewed: 07/12/2019 Elsevier Patient Education  [employer].

## 2021-11-08 ENCOUNTER — Encounter: Payer: Self-pay | Admitting: Surgery

## 2021-11-08 ENCOUNTER — Telehealth: Payer: Self-pay | Admitting: *Deleted

## 2021-11-08 NOTE — Progress Notes (Signed)
Outpatient Surgical Follow Up  11/08/2021  Wanda Nelson is an 88 y.o. female.   Chief Complaint  Patient presents with   Follow-up    Pre-op for surgery 11/14/21    HPI: Wanda Nelson is an 88 year old very nice lady seen following up for a very large paraesophageal hernia that is causing significant symptoms and interfering with her quality of life daily.  Please note that her mind is pristine.  She experiences significant reflux and significant bloating as well as chest pain related to the paraesophageal hernia. Her symptoms have improved reently w PPI but she still symptomatic. She states that she has easy satiety no significant dysphagia.  She did have a CT scan of the chest to rule out PE that I have personally reviewed there is evidence of very large paraesophageal hernia with the entire stomach within the mediastinum.  There is also evidence of organoaxial rotation.  There is some chronic pulmonary issues.  She also has cough and is very miserable related to the paraesophageal hernia.  SHe is very functional from [place] but she is still 32.  She walks and more recently because of the hernia her quality of life has had significant impairment.  She was also very conscious about a potential operation and she said she could not live with this hernia and she was okay if she will not make it after surgical procedure. SHe had a sternotomy for atrial myxoma and was able to tolerate sternotomy and cardiac bypass. INitially it was started w a right thoracotomy and minimally invasive cardiac approach and related to bleeding complications she was converted to open sternotomy w caridiac bypass. She did surprisingly well. She had completed both EGD as well as barium swallow that I have personally reviewed showing giant paraesophageal hernia.  She also has been evaluated by cardiology who thinks she is optimized preoperatively. SHe is ready to move forward with surgical procedure   Past  Medical History:  Diagnosis Date   Actinic keratosis    Anemia    Basal cell carcinoma 05/17/2021   L upper back 5.0 cm lat to spine - ED&C   COVID-19    05/30/20 had covid 19 pill , 10/2021   Dyspnea    GERD (gastroesophageal reflux disease)    Glaucoma    History of blood transfusion    History of hiatal hernia    Hyperlipidemia    Hypertension    Incidental pulmonary nodule, > 69m and < 871m03/12/2018   Noted on CT scan - needs f/u imaging 6 months   s/p resection left atrial myxoma 03/21/2018   Squamous cell carcinoma of skin 11/12/2019   L distal dorsum forearm [facility]    [facility]    Past Surgical History:  Procedure Laterality Date   ABDOMINAL HYSTERECTOMY  1982   total   BUNIONECTOMY Bilateral    CLIPPING OF ATRIAL APPENDAGE  03/21/2018   Procedure: Clipping Of Atrial Appendage - using an AtriCure 4562mlip;  Surgeon: OweRexene AlbertsD;  [facility];;   ESOPHAGOGASTRODUODENOSCOPY (EGD) WITH PROPOFOL N/A 09/06/2021   Procedure: ESOPHAGOGASTRODUODENOSCOPY (EGD) WITH PROPOFOL;  Surgeon: AnnJonathon BellowsD;  [facility];  Service: Gastroenterology;  Laterality: N/A;   EXCISION OF ATRIAL MYXOMA N/A 03/21/2018   Procedure: Excision Of Atrial Myxoma;  Surgeon: OweRexene AlbertsD;  [facility] [place];  Laterality: N/A;   RECTOCELE REPAIR     RIGHT/LEFT  HEART CATH AND CORONARY ANGIOGRAPHY N/A 03/19/2018   Procedure: RIGHT/LEFT HEART CATH AND CORONARY ANGIOGRAPHY;  Surgeon: Wellington Hampshire, MD;  Location: [place] CV LAB;  Service: Cardiovascular;  Laterality: N/A;   TEE WITHOUT CARDIOVERSION N/A 03/21/2018   Procedure: TRANSESOPHAGEAL ECHOCARDIOGRAM (TEE);  Surgeon: Rexene Alberts, MD;  Location: [place];  Service: Open Heart Surgery;  Laterality: N/A;   TONSILLECTOMY      Family History  Problem Relation Age of Onset   Colon cancer Brother    Cancer Brother        neck tumor cancerous     Esophageal varices Brother    Colon cancer Maternal Uncle    Colon cancer Maternal Grandmother    Kidney disease Brother    Coronary artery disease Brother        with stent, cabg   Breast cancer Neg Hx     Social History:  reports that she has never smoked. She has never used smokeless tobacco. She reports that she does not drink alcohol and does not use drugs.  Allergies:  Allergies  Allergen Reactions   Bee Venom Anaphylaxis and Hives   Codeine Other (See Comments)    Altered mental status Altered mental status   Morphine And Related     Altered mental status   Shellfish Allergy Anaphylaxis and Hives    Mainly shrimp   Bactrim [Sulfamethoxazole-Trimethoprim]     ? Reaction     Norvasc [Amlodipine]     2.5 leg edema    Nitrofurantoin Rash    Medications reviewed.    ROS Full ROS performed and is otherwise negative other than what is stated in HPI   BP (!) 165/74   Pulse 80   Temp 97.8 F (36.6 C) (Oral)   Wt 119 lb 6.4 oz (54.2 kg)   LMP  (LMP Unknown)   SpO2 96%   BMI 22.56 kg/m   Physical Exam  Physical Exam Vitals and nursing note reviewed. Exam conducted with a chaperone present.  Constitutional:      General: She is not in acute distress.    Appearance: Normal appearance. She is not ill-appearing, toxic-appearing or diaphoretic.  HENT:     Mouth/Throat:     Pharynx: No oropharyngeal exudate or posterior oropharyngeal erythema.  Eyes:     General: No scleral icterus.       Right eye: No discharge.        Left eye: No discharge.  Neck:     Vascular: No carotid bruit.     Comments: Significant kyphosis but no major significant range of motion issues Cardiovascular:     Rate and Rhythm: Normal rate and regular rhythm.     Heart sounds: No murmur heard.    No friction rub.  Pulmonary:     Effort: Pulmonary effort is normal. No respiratory distress.     Breath sounds: Normal breath sounds. No stridor. No wheezing or rhonchi.  Abdominal:      General: Abdomen is flat. There is no distension.     Palpations: Abdomen is soft. There is no mass.     Tenderness: There is no abdominal tenderness. There is no guarding or rebound.     Hernia: No hernia is present.  Musculoskeletal:        General: No swelling or tenderness. Normal range of motion.     Cervical back: Normal range of motion and neck supple. No rigidity or tenderness.  Lymphadenopathy:     Cervical: No  cervical adenopathy.  Skin:    General: Skin is warm and dry.     Capillary Refill: Capillary refill takes less than 2 seconds.  Neurological:     General: No focal deficit present.     Mental Status: She is alert and oriented to person, place, and time.  Psychiatric:        Mood and Affect: Mood normal.        Behavior: Behavior normal.        Thought Content: Thought content normal.        Judgment: Judgment normal.   Assessment/Plan: 88 year old female with a large paraesophageal hernia.  She is elderly but very functional and she is extremely aware of her situation.  She wants to pursue further surgical intervention.  She would be  a candidate for robotic paraesophageal hernia repair.  She understands that her situation is difficult because any potential complication could be devastatimg and she may not make it.  She is fully aware of the situation.  On the other hand her quality of life is suffering. We talked about robotic repair, risks, benefits and possible complications as well as postoperative course including diet modification.  Risk of bleeding, infection, esophageal or bowel injuries, pneumothorax serous-perioperative complications were discussed with him in detail.  She is well aware. Plan for robotic [facility] repair and partial fundoplication  Please note that I spent greater than 40 minutes in this encounter including personally reviewing imaging studies, coordination of her care, counseling the patient and performing a proper documentation    Caroleen Hamman, MD  [place] Surgeon

## 2021-11-08 NOTE — H&P (View-Only) (Signed)
Outpatient Surgical Follow Up  11/08/2021  Wanda Nelson is an 88 y.o. female.   Chief Complaint  Patient presents with   Follow-up    Pre-op for surgery 11/14/21    HPI: Wanda Nelson is an 88 year old very nice lady seen following up for a very large paraesophageal hernia that is causing significant symptoms and interfering with her quality of life daily.  Please note that her mind is pristine.  She experiences significant reflux and significant bloating as well as chest pain related to the paraesophageal hernia. Her symptoms have improved reently w PPI but she still symptomatic. She states that she has easy satiety no significant dysphagia.  She did have a CT scan of the chest to rule out PE that I have personally reviewed there is evidence of very large paraesophageal hernia with the entire stomach within the mediastinum.  There is also evidence of organoaxial rotation.  There is some chronic pulmonary issues.  She also has cough and is very miserable related to the paraesophageal hernia.  SHe is very functional from [place] but she is still 16.  She walks and more recently because of the hernia her quality of life has had significant impairment.  She was also very conscious about a potential operation and she said she could not live with this hernia and she was okay if she will not make it after surgical procedure. SHe had a sternotomy for atrial myxoma and was able to tolerate sternotomy and cardiac bypass. INitially it was started w a right thoracotomy and minimally invasive cardiac approach and related to bleeding complications she was converted to open sternotomy w caridiac bypass. She did surprisingly well. She had completed both EGD as well as barium swallow that I have personally reviewed showing giant paraesophageal hernia.  She also has been evaluated by cardiology who thinks she is optimized preoperatively. SHe is ready to move forward with surgical procedure   Past  Medical History:  Diagnosis Date   Actinic keratosis    Anemia    Basal cell carcinoma 05/17/2021   L upper back 5.0 cm lat to spine - ED&C   COVID-19    05/30/20 had covid 19 pill , 10/2021   Dyspnea    GERD (gastroesophageal reflux disease)    Glaucoma    History of blood transfusion    History of hiatal hernia    Hyperlipidemia    Hypertension    Incidental pulmonary nodule, > 55m and < 812m03/12/2018   Noted on CT scan - needs f/u imaging 6 months   s/p resection left atrial myxoma 03/21/2018   Squamous cell carcinoma of skin 11/12/2019   L distal dorsum forearm [facility] ED[facility]    [facility]    Past Surgical History:  Procedure Laterality Date   ABDOMINAL HYSTERECTOMY  1982   total   BUNIONECTOMY Bilateral    CLIPPING OF ATRIAL APPENDAGE  03/21/2018   Procedure: Clipping Of Atrial Appendage - using an AtriCure 4591mlip;  Surgeon: OweRexene AlbertsD;  [facility] [place];;   ESOPHAGOGASTRODUODENOSCOPY (EGD) WITH PROPOFOL N/A 09/06/2021   Procedure: ESOPHAGOGASTRODUODENOSCOPY (EGD) WITH PROPOFOL;  Surgeon: AnnJonathon BellowsD;  [facility];  Service: Gastroenterology;  Laterality: N/A;   EXCISION OF ATRIAL MYXOMA N/A 03/21/2018   Procedure: Excision Of Atrial Myxoma;  Surgeon: OweRexene AlbertsD;  [facility] [place];  Laterality: N/A;   RECTOCELE REPAIR     RIGHT/LEFT  HEART CATH AND CORONARY ANGIOGRAPHY N/A 03/19/2018   Procedure: RIGHT/LEFT HEART CATH AND CORONARY ANGIOGRAPHY;  Surgeon: Wellington Hampshire, MD;  Location: [place] CV LAB;  Service: Cardiovascular;  Laterality: N/A;   TEE WITHOUT CARDIOVERSION N/A 03/21/2018   Procedure: TRANSESOPHAGEAL ECHOCARDIOGRAM (TEE);  Surgeon: Rexene Alberts, MD;  Location: [place];  Service: Open Heart Surgery;  Laterality: N/A;   TONSILLECTOMY      Family History  Problem Relation Age of Onset   Colon cancer Brother    Cancer Brother        neck tumor cancerous     Esophageal varices Brother    Colon cancer Maternal Uncle    Colon cancer Maternal Grandmother    Kidney disease Brother    Coronary artery disease Brother        with stent, cabg   Breast cancer Neg Hx     Social History:  reports that she has never smoked. She has never used smokeless tobacco. She reports that she does not drink alcohol and does not use drugs.  Allergies:  Allergies  Allergen Reactions   Bee Venom Anaphylaxis and Hives   Codeine Other (See Comments)    Altered mental status Altered mental status   Morphine And Related     Altered mental status   Shellfish Allergy Anaphylaxis and Hives    Mainly shrimp   Bactrim [Sulfamethoxazole-Trimethoprim]     ? Reaction     Norvasc [Amlodipine]     2.5 leg edema    Nitrofurantoin Rash    Medications reviewed.    ROS Full ROS performed and is otherwise negative other than what is stated in HPI   BP (!) 165/74   Pulse 80   Temp 97.8 F (36.6 C) (Oral)   Wt 119 lb 6.4 oz (54.2 kg)   LMP  (LMP Unknown)   SpO2 96%   BMI 22.56 kg/m   Physical Exam  Physical Exam Vitals and nursing note reviewed. Exam conducted with a chaperone present.  Constitutional:      General: She is not in acute distress.    Appearance: Normal appearance. She is not ill-appearing, toxic-appearing or diaphoretic.  HENT:     Mouth/Throat:     Pharynx: No oropharyngeal exudate or posterior oropharyngeal erythema.  Eyes:     General: No scleral icterus.       Right eye: No discharge.        Left eye: No discharge.  Neck:     Vascular: No carotid bruit.     Comments: Significant kyphosis but no major significant range of motion issues Cardiovascular:     Rate and Rhythm: Normal rate and regular rhythm.     Heart sounds: No murmur heard.    No friction rub.  Pulmonary:     Effort: Pulmonary effort is normal. No respiratory distress.     Breath sounds: Normal breath sounds. No stridor. No wheezing or rhonchi.  Abdominal:      General: Abdomen is flat. There is no distension.     Palpations: Abdomen is soft. There is no mass.     Tenderness: There is no abdominal tenderness. There is no guarding or rebound.     Hernia: No hernia is present.  Musculoskeletal:        General: No swelling or tenderness. Normal range of motion.     Cervical back: Normal range of motion and neck supple. No rigidity or tenderness.  Lymphadenopathy:     Cervical: No  cervical adenopathy.  Skin:    General: Skin is warm and dry.     Capillary Refill: Capillary refill takes less than 2 seconds.  Neurological:     General: No focal deficit present.     Mental Status: She is alert and oriented to person, place, and time.  Psychiatric:        Mood and Affect: Mood normal.        Behavior: Behavior normal.        Thought Content: Thought content normal.        Judgment: Judgment normal.   Assessment/Plan: 88 year old female with a large paraesophageal hernia.  She is elderly but very functional and she is extremely aware of her situation.  She wants to pursue further surgical intervention.  She would be  a candidate for robotic paraesophageal hernia repair.  She understands that her situation is difficult because any potential complication could be devastatimg and she may not make it.  She is fully aware of the situation.  On the other hand her quality of life is suffering. We talked about robotic repair, risks, benefits and possible complications as well as postoperative course including diet modification.  Risk of bleeding, infection, esophageal or bowel injuries, pneumothorax serous-perioperative complications were discussed with him in detail.  She is well aware. Plan for robotic [facility] repair and partial fundoplication  Please note that I spent greater than 40 minutes in this encounter including personally reviewing imaging studies, coordination of her care, counseling the patient and performing a proper documentation    Caroleen Hamman, MD  [place] Surgeon

## 2021-11-08 NOTE — Telephone Encounter (Signed)
-----   Message from Karen Kitchens, NP sent at 11/08/2021  1:38 PM EDT ----- Regarding: Request for pre-operative cardiac clearance Request for pre-operative cardiac clearance:  1. What type of surgery is being performed?  XI ROBOTIC ASSISTED PARAESOPHAGEAL HERNIA REPAIR  2. When is this surgery scheduled?  11/14/2021 Note - previously cleared by Dr. Fletcher Anon back in 08/2021 for same procedure. Given the length of time since clearance, I wanted to ensure that cardiology remained on board with patient proceeding with elective hernia repair.   3. Type of clearance being requested (medical, pharmacy, both)? MEDICAL   4. Are there any medications that need to be held prior to surgery? ASA  5. Practice name and name of physician performing surgery?  Performing surgeon: Dr. Caroleen Hamman, MD Requesting clearance: Honor Loh, FNP-C    6. Anesthesia type (none, local, MAC, general)? GENERAL  7. What is the office phone and fax number?   Phone: [phone] Fax: [phone]  ATTENTION: Unable to create telephone message as per your standard workflow. Directed by [facility] providers to send requests for cardiac clearance to this pool for appropriate distribution to provider covering pre-operative clearances.   Honor Loh, MSN, APRN, FNP-C, CEN [facility]  Peri-operative Services Nurse Practitioner Phone: [phone] 11/08/21 1:38 PM

## 2021-11-08 NOTE — Progress Notes (Signed)
Perioperative Services  Pre-Admission/Anesthesia Testing Clinical Review  Date: 11/10/21  Patient Demographics:  Name: Wanda Nelson DOB:   13-Jan-1931 MRN:   [id number]  Planned Surgical Procedure(s):    Case: [id number] Date/Time: 11/14/21 0715   Procedure: XI ROBOTIC ASSISTED PARAESOPHAGEAL HERNIA REPAIR, RNFA to assist   Anesthesia type: General   Pre-op diagnosis: Paraesophageal hernia   Location: [facility] OR ROOM 04 / [place] [facility] FOR ANEST[facility]   Surgeons: Jules Husbands, MD   NOTE: Available PAT nursing documentation and vital signs have been reviewed. Clinical nursing staff has updated patient's PMH/PSHx, current medication list, and drug allergies/intolerances to ensure comprehensive history available to assist in medical decision making as it pertains to the aforementioned surgical procedure and anticipated anesthetic course. Extensive review of available clinical information performed. Wanda Nelson PMH and PSHx updated with any diagnoses/procedures that  may have been inadvertently omitted during her intake with the pre-admission testing department's nursing staff.  Clinical Discussion:  Wanda Nelson is a 88 y.o. female who is submitted for pre-surgical anesthesia review and clearance prior to her undergoing the above procedure. Patient has never been a smoker. Pertinent PMH includes: atrial myxoma (s/p resection), LAA closure, diastolic dysfunction, NSVT, PAF, PSVT, hemorrhagic CVA, HTN, HLD, pulmonary fibrosis, chronic dyspnea, GERD/LPRD (on daily PPI), large hiatal hernia, anemia, glaucoma.  Patient is followed by cardiology Fletcher Anon, MD). She was last seen in the cardiology clinic on 08/10/2021; notes reviewed.  At the time of her clinic visit, patient doing well overall from a cardiovascular perspective.  She denied any episodes of chest pain, however had chronic shortness of breath related to her pulmonary fibrosis and large paraesophageal hernia causing chronic  gastroesophageal/laryngopharyngeal reflux.  She denied any episodes of PND, orthopnea, palpitations, significant peripheral edema, vertiginous symptoms, or presyncope/syncope.  Patient with a past medical history significant for cardiovascular diagnoses.  TTE performed on 03/17/2018 revealed a 2.4 x 2.5 cm LEFT atrial mass attached to the fossa ovalis/atrial septum.  Mass was pedunculated in shape and solid in texture consistent with atrial myxoma.  Patient ultimately underwent resection on 03/21/2018.  Procedure was initially attempted through a minimally invasive RIGHT thoracotomy approach, however due to bleeding complications, procedure was converted to a median sternotomy and patient was placed on cardiopulmonary bypass.  Patient also underwent LAA closure/ligation with placement of a #45 Atriacure clip curing the procedure for her myoxoma resection.  Diagnostic RIGHT/LEFT heart catheterization performed on 03/19/2018 demonstrated normal coronary anatomy with no evidence of obstructive coronary artery disease.  Left ventricular gram was not performed.  There were normal RIGHT-sided filling pressures, normal pulmonary pressure, and normal cardiac output noted.  Long-term cardiac event monitor study performed on 05/13/2018 demonstrating underlying normal sinus rhythm at an average rate of 63 bpm.  There were 5 runs of NSVT.  Additionally, there were intermittent atrial fibrillation with RVR episodes, with the longest lasting 1 hour and 25 minutes (atrial fibrillation burden <1.0%).  There were frequent runs of SVT noted.  Most recent TTE was performed on 05/08/2021 revealed a normal left ventricular systolic function with an EF of 60 to 65%.  There was mild LVH.  Mild to moderate tricuspid valve regurgitation noted.  Aortic valve was sclerotic without evidence of stenosis.  All transvalvular gradients were normal.  Blood pressure well controlled at 120/74 mmHg on currently prescribed ARB (losartan)  monotherapy.  Patient is on atorvastatin + omega-3 fatty acid for her HLD diagnosis and ASCVD prevention.  She is not diabetic. Patient does  not have an OSAH diagnosis.  Functional capacity limited by age and multiple medical comorbidities.  With that being said, patient still felt to be able to achieve at least 4 METS of physical activity without experiencing any significant degree of angina/anginal equivalent symptoms.  No changes were made to her medication regimen.  Patient to follow-up with outpatient cardiology in 6 months or sooner if needed.  Wanda Nelson is scheduled for a XI ROBOTIC ASSISTED PARAESOPHAGEAL HERNIA REPAIR on 11/14/2021 with Dr. Caroleen Hamman, MD.  Given patient's past medical history significant for cardiovascular/cardiopulmonary diagnoses, presurgical clearances were sought from patient's primary cardiologist and pulmonologist.  Specialty clearances were obtained as follows.    Per pulmonary medicine Lanney Gins, MD), ""this patient is optimized for surgery and may proceed with the planned procedural course with an ACCEPTABLE risk of significant perioperative cardiopulmonary complications".  Per cardiology Fletcher Anon, MD), "the patient has excellent exercise capacity for age and recent echocardiogram showed normal LV systolic function with no significant valvular abnormalities.  Cardiac catheterization in 2020 showed no evidence of coronary artery disease.  Based on all of the above, the patient can proceed with surgery at an overall LOW risk from a cardiac standpoint, but obviously given her age and frail status, she is at higher risk for complications overall.  No need for further ischemic cardiac evaluation before surgery".  In review of her medication reconciliation, it is noted the patient is on daily antiplatelet therapy.  She has been instructed on recommendations from her cardiologist for holding her daily low-dose ASA for 5 days prior to her procedure with plans to  restart her since postoperative bleeding risk felt to be minimized by her primary attending surgeon.  The patient is aware that her last dose of ASA should be on 11/08/2021.  Patient denies previous perioperative complications with anesthesia in the past. In review of the available records, it is noted that patient underwent a general anesthetic course here at [facility] ([facility]) in 08/2021 without documented complications.      11/06/2021    1:27 PM 10/17/2021    9:04 AM 10/10/2021    4:15 PM  Vitals with BMI  Height  '5\' 1"'$  '5\' 1"'$   Weight 119 lbs 6 oz 118 lbs 118 lbs  BMI 22.57 15.40 08.67  Systolic 619 509   Diastolic 74 68   Pulse 80      Providers/Specialists:   NOTE: Primary physician provider listed below. Patient may have been seen by APP or partner within same practice.   PROVIDER ROLE / SPECIALTY LAST Suszanne Finch, MD General Surgery (Surgeon) 11/06/2021  Carollee Leitz, MD Primary Care Provider 10/17/2021  [name], MD Cardiology 08/15/2021  Ottie Glazier, MD Pulmonary Medicine 08/31/2021   Allergies:  Bee venom, Codeine, Morphine and related, Shellfish allergy, Bactrim [sulfamethoxazole-trimethoprim], Norvasc [amlodipine], and Nitrofurantoin  Current Home Medications:   No current facility-administered medications for this encounter.    acetaminophen (TYLENOL) 325 MG tablet   Ascorbic Acid (VITAMIN C PO)   aspirin EC 81 MG tablet   atorvastatin (LIPITOR) 20 MG tablet   Calcium Carbonate-Vitamin D 500-125 MG-UNIT TABS   cholecalciferol (VITAMIN D) 1000 units tablet   ferrous sulfate (FEROSUL) 325 (65 FE) MG tablet   Flaxseed, Linseed, (FLAX SEED OIL) 1300 MG CAPS   ipratropium-albuterol (DUONEB) 0.5-2.5 (3) MG/3ML SOLN   loratadine (CLARITIN) 10 MG tablet   losartan (COZAAR) 25 MG tablet   Multiple Vitamin (MULTIVITAMIN) tablet  Omega-3 Fatty Acids (FISH OIL) 1200 MG CAPS   pantoprazole (PROTONIX) 40 MG tablet    History:   Past Medical History:  Diagnosis Date   Actinic keratosis    Anemia    Aortic atherosclerosis ([place])    Atrial myxoma 03/17/2018   a.) TTE 03/17/2018 --> 2.4 x 2.5 cm LEFT atrial mass attached at fossa ovalis/atrial septum; pedunculated in shape and solid in texture; b.) s/p atrial myoxma resection 03/21/2018; initially attempted via minimally invasive RIGHT thoracotomy approach, however due to bleeding complications, procedure required median sternotomy and patient going on cardiopulmonary bypass   Basal cell carcinoma 05/17/2021   L upper back 5.0 cm lat to spine - ED&C   Diastolic dysfunction    a.) TTE 03/17/2018: EF 55-60%, LVH, mild PR, G1DD; b.) TTE 07/25/2018: EF 60-65%, RVSP 44.9, mild-mod MR/TR, G1DD; c.) TTE 12/18/2018: EF 60-65%; d.) TTE 06/12/2019: EF 50-55, mild BAE, triv TR, PASP 37.5, G2DD; e.) TTE 07/22/2020: EF 60-65%, mild MR, mod TR, RVSP 42.7, G1DD; f.) TTE 05/08/2021: EF 60-65%, mild LVH, mild-mod TR, AoV sclerosis without stenosis   Dyspnea    GERD (gastroesophageal reflux disease)    Glaucoma    Hemorrhagic cerebrovascular accident (CVA) ([place]) 1990   Hiatal hernia    History of 2019 novel coronavirus disease (COVID-19)    a.) 05/30/20 - Tx'd with "covid 19 pill"; b.) 10/2021   History of blood transfusion    History of left atrial appendage closure 03/21/2018   a.) #45 Atricure   Hyperlipidemia    Hypertension    Incidental pulmonary nodule, > 59m and < 824m03/12/2018   Noted on CT scan - needs f/u imaging 6 months   NSVT ([place]ustained ventricular tachycardia) [place] [place]   a.) holter 05/13/2018 --> 5 short runs   PAF (paroxysmal atrial fibrillation) [place] [place]   a.) holter 05/13/2018 --> intermittent episodes with longest lasting approx 1.5 hours (burden <1%).   PSVT (paroxysmal supraventricular tachycardia) 05/13/2018   a.) holter 05/13/2018 --> frequent runs of SVT noted during study   Pulmonary fibrosis ([place]   Squamous cell  carcinoma of skin 11/12/2019   L distal dorsum forearm - ED&C    Past Surgical History:  Procedure Laterality Date   BUNIONECTOMY Bilateral    CLIPPING OF ATRIAL APPENDAGE  03/21/2018   Procedure: Clipping Of Atrial Appendage - using an AtriCure 4527mlip;  Surgeon: OweRexene AlbertsD;  [facility];;   ESOPHAGOGASTRODUODENOSCOPY (EGD) WITH PROPOFOL N/A 09/06/2021   Procedure: ESOPHAGOGASTRODUODENOSCOPY (EGD) WITH PROPOFOL;  Surgeon: AnnJonathon BellowsD;  [facility];  Service: Gastroenterology;  Laterality: N/A;   EXCISION OF ATRIAL MYXOMA N/A 03/21/2018   Procedure: Excision Of Atrial Myxoma;  Surgeon: OweRexene AlbertsD;  [facility] [place];  Laterality: N/A;   RECTOCELE REPAIR     RIGHT/LEFT HEART CATH AND CORONARY ANGIOGRAPHY N/A 03/19/2018   Procedure: RIGHT/LEFT HEART CATH AND CORONARY ANGIOGRAPHY;  Surgeon: AriWellington HampshireD;  [facility] [place] [facility];  Service: Cardiovascular;  Laterality: N/A;   TEE WITHOUT CARDIOVERSION N/A 03/21/2018   Procedure: TRANSESOPHAGEAL ECHOCARDIOGRAM (TEE);  Surgeon: OweRexene AlbertsD;  [facility] [place];  Laterality: N/A;   TONSILLECTOMY     TOTAL ABDOMINAL HYSTERECTOMY  01/09/1980   Family History  Problem Relation Age of Onset   Colon cancer Brother    Cancer Brother  neck tumor cancerous    Esophageal varices Brother    Colon cancer Maternal Uncle    Colon cancer Maternal Grandmother    Kidney disease Brother    Coronary artery disease Brother        with stent, cabg   Breast cancer Neg Hx    Social History   Tobacco Use   Smoking status: Never   Smokeless tobacco: Never  Vaping Use   Vaping Use: Never used  Substance Use Topics   Alcohol use: No    Alcohol/week: 0.0 standard drinks of alcohol   Drug use: No    Pertinent Clinical Results:  LABS: Labs reviewed: Acceptable for surgery.  No visits with results  within 3 Day(s) from this visit.  Latest known visit with results is:  Hospital Outpatient Visit on 11/03/2021  Component Date Value Ref Range Status   WBC 11/03/2021 7.7  4.0 - 10.5 K/uL Final   RBC 11/03/2021 4.54  3.87 - 5.11 MIL/uL Final   Hemoglobin 11/03/2021 13.9  12.0 - 15.0 g/dL Final   HCT 11/03/2021 42.2  36.0 - 46.0 % Final   MCV 11/03/2021 93.0  80.0 - 100.0 fL Final   MCH 11/03/2021 30.6  26.0 - 34.0 pg Final   MCHC 11/03/2021 32.9  30.0 - 36.0 g/dL Final   RDW 11/03/2021 14.9  11.5 - 15.5 % Final   Platelets 11/03/2021 175  150 - 400 K/uL Final   nRBC 11/03/2021 0.0  0.0 - 0.2 % Final   Performed at [facility], [place], [place] [address]   Sodium 11/03/2021 136  135 - 145 mmol/L Final   Potassium 11/03/2021 4.0  3.5 - 5.1 mmol/L Final   Chloride 11/03/2021 105  98 - 111 mmol/L Final   CO2 11/03/2021 24  22 - 32 mmol/L Final   Glucose, Bld 11/03/2021 72  70 - 99 mg/dL Final   Glucose reference range applies only to samples taken after fasting for at least 8 hours.   BUN 11/03/2021 21  8 - 23 mg/dL Final   Creatinine, Ser 11/03/2021 0.71  0.44 - 1.00 mg/dL Final   Calcium 11/03/2021 9.6  8.9 - 10.3 mg/dL Final   GFR, Estimated 11/03/2021 >60  >60 mL/min Final   Comment: (NOTE) Calculated using the CKD-EPI Creatinine Equation (2021)    Anion gap 11/03/2021 7  5 - 15 Final   Performed at [facility], [address] [place], [place] [address]    ECG: Date: 08/10/2021 Time ECG obtained: 1329 PM Rate: 58 bpm Rhythm: sinus bradycardia Axis (leads I and aVF): Normal Intervals: PR 124 ms. QRS 74 ms. QTc 392 ms. ST segment and T wave changes: No evidence of acute ST segment elevation or depression.  Evidence of an age undetermined anteroseptal infarct noted Comparison: Similar to previous tracing obtained on 05/08/2021    IMAGING / PROCEDURES: DG ESOPHAGUS W SINGLE CM (SOL OR THIN BA) performed on 08/03/2021 Patulous esophagus  with mild narrowing of the distal esophagus and associated dysmotility. The possibility of distal esophageal stricture is considered. Endoscopic assessment may be helpful for further evaluation as warranted, this could also be related to mass effect from the mixed type hernia. Large hiatal hernia with mixed type characteristics, GE junction just above esophageal hiatus and inversion of the stomach with greater curvature directed cephalad with approximately 80-90% of the stomach herniated into the chest. Emptying of the intrathoracic stomach into the intra-abdominal stomach showed no signs of obstruction  related to the large hiatal hernia. Mild gastroesophageal reflux  CT ANGIO CHEST PE W AND/OR WO CONTRAST performed on 05/08/2021 No CT findings for pulmonary embolism Normal caliber thoracic aorta.  No dissection or aneurysm. Very large hiatal hernia with most of the stomach up in the chest.  Organoaxial volvulus noted. Chronic changes of pulmonary fibrosis.  No definite acute overlying pulmonary process. Aortic atherosclerosis Emphysema  TRANSTHORACIC ECHOCARDIOGRAM performed on 05/08/2021 Left ventricular ejection fraction, by estimation, is 60 to 65%. The left ventricle has normal function. The left ventricle has no regional wall motion abnormalities. There is mild left ventricular hypertrophy. Left ventricular diastolic parameters were normal.  Right ventricular systolic function is normal. The right ventricular size is normal. Tricuspid regurgitation signal is inadequate for assessing PA pressure.  The mitral valve is normal in structure. No evidence of mitral valve regurgitation. No evidence of mitral stenosis.  Tricuspid valve regurgitation is mild to moderate.  The aortic valve is normal in structure. Aortic valve regurgitation is not visualized. Aortic valve sclerosis/calcification is present, without any evidence of aortic stenosis.   LONG TERM CARDIAC EVENT MONITOR STUDY performed on  05/13/2018 Predominant underlying normal sinus rhythm with an average heart rate of 63 bpm 5 short runs of ventricular tachycardia Intermittent atrial fibrillation with RVR with the longest episode lasting 1 hour and 25 minutes.  A-fib burden <1.0% Frequent runs of SVT  RIGHT/LEFT HEART CATHETERIZATION performed on 03/19/2018 Normal coronary arteries RIGHT heart catheterization showed normal filling pressures, normal pulmonary pressure, and normal cardiac output LEFT ventricular angiography was not performed.  EF was normal by echo Recommendations: urgent surgical evaluation for resection of LEFT atrial mass (likely myxoma). The mass is mobile and at high risk for embolization.  CVTS consulted.  Impression and Plan:  Odyssey Vasbinder has been referred for pre-anesthesia review and clearance prior to her undergoing the planned anesthetic and procedural courses. Available labs, pertinent testing, and imaging results were personally reviewed by me. This patient has been appropriately cleared by cardiology (LOW) and by pulmonary medicine (ACCEPTABLE) with the individually indicated risk of significant cardiovascular/cardiopulmonary complications.    Based on clinical review performed today (11/10/21), barring any significant acute changes in the patient's overall condition, it is anticipated that she will be able to proceed with the planned surgical intervention. Any acute changes in clinical condition may necessitate her procedure being postponed and/or cancelled. Patient will meet with anesthesia team (MD and/or CRNA) on the day of her procedure for preoperative evaluation/assessment. Questions regarding anesthetic course will be fielded at that time.   Pre-surgical instructions were reviewed with the patient during her PAT appointment and questions were fielded by PAT clinical staff. Patient was advised that if any questions or concerns arise prior to her procedure then she should return a call  to PAT and/or her surgeon's office to discuss.  Honor Loh, MSN, APRN, FNP-C, CEN [facility]  Peri-operative Services Nurse Practitioner Phone: [phone] Fax: [phone] 11/10/21 9:38 AM  NOTE: This note has been prepared using Dragon dictation software. Despite my best ability to proofread, there is always the potential that unintentional transcriptional errors may still occur from this process.

## 2021-11-09 NOTE — Progress Notes (Unsigned)
Cardiology Clearance has been received from Dr Fletcher Anon. The patient is cleared at Low risk from a cardiology perspective. All notes are in Epic.

## 2021-11-09 NOTE — Telephone Encounter (Signed)
   Patient Name: Wanda Nelson  DOB: [birth year] MRN: [id number]  Primary Cardiologist: [name], MD  Chart reviewed as part of pre-operative protocol coverage. Pre-op clearance already addressed by colleagues in earlier phone notes. To summarize recommendations:  -Patient's cardiologist, Dr. [name], saw patient on August 10, 2021 before pending procedure and stated, "Preoperative cardiovascular evaluation for large hiatal hernia repair: The patient has excellent exercise capacity for age and recent echocardiogram showed normal LV systolic function with no significant valvular abnormalities.  Cardiac catheterization in 2020 showed no evidence of coronary artery disease.  Based on all of the above, the patient can proceed with surgery at an overall low risk from a cardiac standpoint but obviously given her age and frail status, she is at higher risk for complications overall.  No need for further ischemic cardiac evaluation before surgery.  Aspirin can be held 5 to 7 days before surgery if needed."  Will route this bundled recommendation to requesting provider via Epic fax function and remove from pre-op pool. Please call with questions.  Finis Bud, NP 11/09/2021, 1:26 PM

## 2021-11-09 NOTE — Telephone Encounter (Signed)
I already cleared her for this surgery.  She has not had the surgery since I cleared her.

## 2021-11-10 ENCOUNTER — Encounter: Payer: Self-pay | Admitting: Surgery

## 2021-11-13 DIAGNOSIS — H0288B Meibomian gland dysfunction left eye, upper and lower eyelids: Secondary | ICD-10-CM | POA: Diagnosis not present

## 2021-11-13 DIAGNOSIS — H401112 Primary open-angle glaucoma, right eye, moderate stage: Secondary | ICD-10-CM | POA: Diagnosis not present

## 2021-11-13 DIAGNOSIS — Z961 Presence of intraocular lens: Secondary | ICD-10-CM | POA: Diagnosis not present

## 2021-11-13 DIAGNOSIS — H16223 Keratoconjunctivitis sicca, not specified as Sjogren's, bilateral: Secondary | ICD-10-CM | POA: Diagnosis not present

## 2021-11-13 DIAGNOSIS — H0288A Meibomian gland dysfunction right eye, upper and lower eyelids: Secondary | ICD-10-CM | POA: Diagnosis not present

## 2021-11-13 DIAGNOSIS — H401123 Primary open-angle glaucoma, left eye, severe stage: Secondary | ICD-10-CM | POA: Diagnosis not present

## 2021-11-13 MED ORDER — ORAL CARE MOUTH RINSE: 15.0000 mL | Freq: Once | OROMUCOSAL | Status: AC

## 2021-11-13 MED ORDER — LACTATED RINGERS IV SOLN: INTRAVENOUS | Status: DC

## 2021-11-13 MED ORDER — CEFAZOLIN SODIUM-DEXTROSE 2-4 GM/100ML-% IV SOLN
2.0000 g | INTRAVENOUS | Status: AC
  Administered 2021-11-14: 2 g via INTRAVENOUS

## 2021-11-13 MED ORDER — ACETAMINOPHEN 500 MG PO TABS: 1000.0000 mg | ORAL_TABLET | ORAL | Status: AC

## 2021-11-13 MED ORDER — CHLORHEXIDINE GLUCONATE CLOTH 2 % EX PADS
6.0000 | MEDICATED_PAD | Freq: Once | CUTANEOUS | Status: AC
  Administered 2021-11-14: 6 via TOPICAL

## 2021-11-13 MED ORDER — CHLORHEXIDINE GLUCONATE 0.12 % MT SOLN: 15.0000 mL | Freq: Once | OROMUCOSAL | Status: AC

## 2021-11-13 MED ORDER — GABAPENTIN 300 MG PO CAPS: 300.0000 mg | ORAL_CAPSULE | ORAL | Status: AC

## 2021-11-14 ENCOUNTER — Ambulatory Visit: Payer: Medicare Other | Admitting: Urgent Care

## 2021-11-14 ENCOUNTER — Observation Stay: Payer: Medicare Other

## 2021-11-14 ENCOUNTER — Encounter
Admission: RE | Disposition: A | Discharge status: Home / Self Care | Payer: Self-pay | Source: Home / Self Care | Attending: Surgery

## 2021-11-14 ENCOUNTER — Other Ambulatory Visit: Payer: Self-pay

## 2021-11-14 ENCOUNTER — Encounter: Payer: Self-pay | Admitting: Surgery

## 2021-11-14 ENCOUNTER — Observation Stay
Admission: RE | Admit: 2021-11-14 | Discharge: 2021-11-15 | Disposition: A | Discharge status: Home / Self Care | Payer: Medicare Other | Attending: Surgery | Admitting: Surgery

## 2021-11-14 DIAGNOSIS — K449 Diaphragmatic hernia without obstruction or gangrene: Principal | ICD-10-CM | POA: Insufficient documentation

## 2021-11-14 DIAGNOSIS — Z8719 Personal history of other diseases of the digestive system: Secondary | ICD-10-CM

## 2021-11-14 DIAGNOSIS — E785 Hyperlipidemia, unspecified: Secondary | ICD-10-CM | POA: Diagnosis not present

## 2021-11-14 DIAGNOSIS — I10 Essential (primary) hypertension: Secondary | ICD-10-CM | POA: Insufficient documentation

## 2021-11-14 DIAGNOSIS — K3189 Other diseases of stomach and duodenum: Secondary | ICD-10-CM | POA: Diagnosis not present

## 2021-11-14 DIAGNOSIS — Z8616 Personal history of COVID-19: Secondary | ICD-10-CM | POA: Diagnosis not present

## 2021-11-14 DIAGNOSIS — J449 Chronic obstructive pulmonary disease, unspecified: Secondary | ICD-10-CM | POA: Diagnosis not present

## 2021-11-14 DIAGNOSIS — Z85828 Personal history of other malignant neoplasm of skin: Secondary | ICD-10-CM | POA: Insufficient documentation

## 2021-11-14 DIAGNOSIS — Z8673 Personal history of transient ischemic attack (TIA), and cerebral infarction without residual deficits: Secondary | ICD-10-CM | POA: Insufficient documentation

## 2021-11-14 HISTORY — DX: Other ill-defined heart diseases: I51.89

## 2021-11-14 HISTORY — DX: Personal history of other diseases of the digestive system: Z87.19

## 2021-11-14 HISTORY — DX: Diaphragmatic hernia without obstruction or gangrene: K44.9

## 2021-11-14 HISTORY — DX: Personal history of COVID-19: Z86.16

## 2021-11-14 HISTORY — DX: Atherosclerosis of aorta: I70.0

## 2021-11-14 HISTORY — DX: Pulmonary fibrosis, unspecified: J84.10

## 2021-11-14 HISTORY — PX: XI ROBOTIC ASSISTED PARAESOPHAGEAL HERNIA REPAIR: SHX6871

## 2021-11-14 LAB — CBC
HCT: 39.7 % (ref 36.0–46.0)
Hemoglobin: 13.1 g/dL (ref 12.0–15.0)
MCH: 30.2 pg (ref 26.0–34.0)
nRBC: 0 % (ref 0.0–0.2)

## 2021-11-14 LAB — CREATININE, SERUM: GFR, Estimated: >60 mL/min (ref >60)

## 2021-11-14 SURGERY — REPAIR, HERNIA, PARAESOPHAGEAL, ROBOT-ASSISTED
Anesthesia: General

## 2021-11-14 MED ORDER — ONDANSETRON HCL 4 MG/2ML IJ SOLN
INTRAMUSCULAR | Status: DC | PRN
  Administered 2021-11-14: 4 mg via INTRAVENOUS

## 2021-11-14 MED ORDER — FENTANYL CITRATE (PF) 100 MCG/2ML IJ SOLN
INTRAMUSCULAR | Status: DC | PRN
  Administered 2021-11-14 (×4): 50 ug via INTRAVENOUS

## 2021-11-14 MED ORDER — PHENYLEPHRINE 80 MCG/ML (10ML) SYRINGE FOR IV PUSH (FOR BLOOD PRESSURE SUPPORT)
PREFILLED_SYRINGE | INTRAVENOUS | Status: AC
  Filled 2021-11-14: qty 20

## 2021-11-14 MED ORDER — ENOXAPARIN SODIUM 40 MG/0.4ML IJ SOSY: 40.0000 mg | PREFILLED_SYRINGE | INTRAMUSCULAR | Status: DC

## 2021-11-14 MED ORDER — EPHEDRINE 5 MG/ML INJ
INTRAVENOUS | Status: AC
  Filled 2021-11-14: qty 15

## 2021-11-14 MED ORDER — FENTANYL CITRATE (PF) 100 MCG/2ML IJ SOLN: 25.0000 ug | INTRAMUSCULAR | Status: DC | PRN

## 2021-11-14 MED ORDER — GABAPENTIN 300 MG PO CAPS
ORAL_CAPSULE | ORAL | Status: AC
  Administered 2021-11-14: 300 mg via ORAL
  Filled 2021-11-14: qty 1

## 2021-11-14 MED ORDER — KETOROLAC TROMETHAMINE 30 MG/ML IJ SOLN
15.0000 mg | Freq: Once | INTRAMUSCULAR | Status: AC
  Administered 2021-11-14: 15 mg via INTRAVENOUS

## 2021-11-14 MED ORDER — IOHEXOL 300 MG/ML  SOLN: 50.0000 mL | Freq: Once | INTRAMUSCULAR | Status: DC | PRN

## 2021-11-14 MED ORDER — BUPIVACAINE-EPINEPHRINE (PF) 0.25% -1:200000 IJ SOLN
INTRAMUSCULAR | Status: AC
  Filled 2021-11-14: qty 30

## 2021-11-14 MED ORDER — OXYCODONE HCL 5 MG PO TABS: 5.0000 mg | ORAL_TABLET | ORAL | Status: DC | PRN

## 2021-11-14 MED ORDER — PROPOFOL 10 MG/ML IV BOLUS
INTRAVENOUS | Status: AC
  Filled 2021-11-14: qty 20

## 2021-11-14 MED ORDER — HYDROMORPHONE HCL 1 MG/ML IJ SOLN: 0.5000 mg | INTRAMUSCULAR | Status: DC | PRN

## 2021-11-14 MED ORDER — ONDANSETRON HCL 4 MG/2ML IJ SOLN: 4.0000 mg | Freq: Four times a day (QID) | INTRAMUSCULAR | Status: DC | PRN

## 2021-11-14 MED ORDER — GLYCOPYRROLATE 0.2 MG/ML IJ SOLN
INTRAMUSCULAR | Status: DC | PRN
  Administered 2021-11-14: .2 mg via INTRAVENOUS

## 2021-11-14 MED ORDER — MELATONIN 5 MG PO TABS: 2.5000 mg | ORAL_TABLET | Freq: Every evening | ORAL | Status: DC | PRN

## 2021-11-14 MED ORDER — IPRATROPIUM-ALBUTEROL 0.5-2.5 (3) MG/3ML IN SOLN: 3.0000 mL | Freq: Four times a day (QID) | RESPIRATORY_TRACT | Status: DC | PRN

## 2021-11-14 MED ORDER — DIPHENHYDRAMINE HCL 50 MG/ML IJ SOLN: 12.5000 mg | Freq: Four times a day (QID) | INTRAMUSCULAR | Status: DC | PRN

## 2021-11-14 MED ORDER — VISTASEAL 10 ML SINGLE DOSE KIT
PACK | CUTANEOUS | Status: DC | PRN
  Administered 2021-11-14: 10 mL via TOPICAL

## 2021-11-14 MED ORDER — LIDOCAINE HCL (PF) 2 % IJ SOLN
INTRAMUSCULAR | Status: AC
  Filled 2021-11-14: qty 5

## 2021-11-14 MED ORDER — FENTANYL CITRATE (PF) 100 MCG/2ML IJ SOLN
INTRAMUSCULAR | Status: AC
  Filled 2021-11-14: qty 2

## 2021-11-14 MED ORDER — ONDANSETRON HCL 4 MG/2ML IJ SOLN
INTRAMUSCULAR | Status: AC
  Filled 2021-11-14: qty 2

## 2021-11-14 MED ORDER — SODIUM CHLORIDE 0.9 % IV SOLN: INTRAVENOUS | Status: DC

## 2021-11-14 MED ORDER — ROCURONIUM BROMIDE 10 MG/ML (PF) SYRINGE
PREFILLED_SYRINGE | INTRAVENOUS | Status: AC
  Filled 2021-11-14: qty 10

## 2021-11-14 MED ORDER — ACETAMINOPHEN 500 MG PO TABS
1000.0000 mg | ORAL_TABLET | Freq: Four times a day (QID) | ORAL | Status: DC
  Administered 2021-11-14 – 2021-11-15 (×4): 1000 mg via ORAL
  Filled 2021-11-14 (×4): qty 2

## 2021-11-14 MED ORDER — ONDANSETRON 4 MG PO TBDP: 4.0000 mg | ORAL_TABLET | Freq: Four times a day (QID) | ORAL | Status: DC | PRN

## 2021-11-14 MED ORDER — LORATADINE 10 MG PO TABS: 10.0000 mg | ORAL_TABLET | Freq: Every evening | ORAL | Status: DC | PRN

## 2021-11-14 MED ORDER — DEXAMETHASONE SODIUM PHOSPHATE 10 MG/ML IJ SOLN
INTRAMUSCULAR | Status: AC
  Filled 2021-11-14: qty 1

## 2021-11-14 MED ORDER — PHENYLEPHRINE HCL-NACL 20-0.9 MG/250ML-% IV SOLN
INTRAVENOUS | Status: AC
  Filled 2021-11-14: qty 250

## 2021-11-14 MED ORDER — PROPOFOL 1000 MG/100ML IV EMUL
INTRAVENOUS | Status: AC
  Filled 2021-11-14: qty 100

## 2021-11-14 MED ORDER — EPHEDRINE SULFATE (PRESSORS) 50 MG/ML IJ SOLN
INTRAMUSCULAR | Status: DC | PRN
  Administered 2021-11-14 (×4): 5 mg via INTRAVENOUS
  Administered 2021-11-14: 10 mg via INTRAVENOUS
  Administered 2021-11-14 (×2): 5 mg via INTRAVENOUS

## 2021-11-14 MED ORDER — CHLORHEXIDINE GLUCONATE 0.12 % MT SOLN
OROMUCOSAL | Status: AC
  Administered 2021-11-14: 15 mL via OROMUCOSAL
  Filled 2021-11-14: qty 15

## 2021-11-14 MED ORDER — ACETAMINOPHEN 10 MG/ML IV SOLN: 1000.0000 mg | Freq: Once | INTRAVENOUS | Status: AC

## 2021-11-14 MED ORDER — PROPOFOL 10 MG/ML IV BOLUS
INTRAVENOUS | Status: DC | PRN
  Administered 2021-11-14: 40 mg via INTRAVENOUS
  Administered 2021-11-14: 90 mg via INTRAVENOUS

## 2021-11-14 MED ORDER — BUPIVACAINE-EPINEPHRINE 0.25% -1:200000 IJ SOLN
INTRAMUSCULAR | Status: DC | PRN
  Administered 2021-11-14: 30 mL

## 2021-11-14 MED ORDER — LIDOCAINE HCL (CARDIAC) PF 100 MG/5ML IV SOSY
PREFILLED_SYRINGE | INTRAVENOUS | Status: DC | PRN
  Administered 2021-11-14: 60 mg via INTRAVENOUS

## 2021-11-14 MED ORDER — CEFAZOLIN SODIUM-DEXTROSE 2-4 GM/100ML-% IV SOLN
INTRAVENOUS | Status: AC
  Filled 2021-11-14: qty 100

## 2021-11-14 MED ORDER — DIPHENHYDRAMINE HCL 12.5 MG/5ML PO ELIX: 12.5000 mg | ORAL_SOLUTION | Freq: Four times a day (QID) | ORAL | Status: DC | PRN

## 2021-11-14 MED ORDER — VISTASEAL 10 ML SINGLE DOSE KIT
PACK | CUTANEOUS | Status: AC
  Filled 2021-11-14: qty 10

## 2021-11-14 MED ORDER — LOSARTAN POTASSIUM 25 MG PO TABS
12.5000 mg | ORAL_TABLET | Freq: Every day | ORAL | Status: DC
  Administered 2021-11-15: 25 mg via ORAL
  Filled 2021-11-14: qty 1

## 2021-11-14 MED ORDER — ACETAMINOPHEN 500 MG PO TABS
ORAL_TABLET | ORAL | Status: AC
  Administered 2021-11-14: 1000 mg via ORAL
  Filled 2021-11-14: qty 2

## 2021-11-14 MED ORDER — OXYCODONE HCL 5 MG/5ML PO SOLN: 5.0000 mg | Freq: Once | ORAL | Status: DC | PRN

## 2021-11-14 MED ORDER — ROCURONIUM BROMIDE 100 MG/10ML IV SOLN
INTRAVENOUS | Status: DC | PRN
  Administered 2021-11-14: 40 mg via INTRAVENOUS
  Administered 2021-11-14: 20 mg via INTRAVENOUS

## 2021-11-14 MED ORDER — PROCHLORPERAZINE MALEATE 10 MG PO TABS: 10.0000 mg | ORAL_TABLET | Freq: Four times a day (QID) | ORAL | Status: DC | PRN

## 2021-11-14 MED ORDER — BUPIVACAINE HCL (PF) 0.5 % IJ SOLN
INTRAMUSCULAR | Status: AC
  Filled 2021-11-14: qty 10

## 2021-11-14 MED ORDER — 0.9 % SODIUM CHLORIDE (POUR BTL) OPTIME
TOPICAL | Status: DC | PRN
  Administered 2021-11-14: 500 mL

## 2021-11-14 MED ORDER — KETAMINE HCL 10 MG/ML IJ SOLN
INTRAMUSCULAR | Status: DC | PRN
  Administered 2021-11-14: 20 mg via INTRAVENOUS

## 2021-11-14 MED ORDER — BUPIVACAINE LIPOSOME 1.3 % IJ SUSP
INTRAMUSCULAR | Status: AC
  Filled 2021-11-14: qty 20

## 2021-11-14 MED ORDER — DEXAMETHASONE SODIUM PHOSPHATE 10 MG/ML IJ SOLN
INTRAMUSCULAR | Status: DC | PRN
  Administered 2021-11-14: 5 mg via INTRAVENOUS

## 2021-11-14 MED ORDER — ROCURONIUM BROMIDE 10 MG/ML (PF) SYRINGE
PREFILLED_SYRINGE | INTRAVENOUS | Status: AC
  Filled 2021-11-14: qty 20

## 2021-11-14 MED ORDER — KETOROLAC TROMETHAMINE 15 MG/ML IJ SOLN
INTRAMUSCULAR | Status: AC
  Filled 2021-11-14: qty 1

## 2021-11-14 MED ORDER — HYDRALAZINE HCL 20 MG/ML IJ SOLN: 10.0000 mg | INTRAMUSCULAR | Status: DC | PRN

## 2021-11-14 MED ORDER — OXYCODONE HCL 5 MG PO TABS: 5.0000 mg | ORAL_TABLET | Freq: Once | ORAL | Status: DC | PRN

## 2021-11-14 MED ORDER — SUGAMMADEX SODIUM 200 MG/2ML IV SOLN
INTRAVENOUS | Status: DC | PRN
  Administered 2021-11-14: 100 mg via INTRAVENOUS

## 2021-11-14 MED ORDER — LABETALOL HCL 5 MG/ML IV SOLN
INTRAVENOUS | Status: AC
  Filled 2021-11-14: qty 4

## 2021-11-14 MED ORDER — KETAMINE HCL 50 MG/5ML IJ SOSY
PREFILLED_SYRINGE | INTRAMUSCULAR | Status: AC
  Filled 2021-11-14: qty 5

## 2021-11-14 MED ORDER — LABETALOL HCL 5 MG/ML IV SOLN
INTRAVENOUS | Status: DC | PRN
  Administered 2021-11-14: 5 mg via INTRAVENOUS

## 2021-11-14 MED ORDER — BUPIVACAINE LIPOSOME 1.3 % IJ SUSP
INTRAMUSCULAR | Status: DC | PRN
  Administered 2021-11-14: 20 mL

## 2021-11-14 MED ORDER — ACETAMINOPHEN 10 MG/ML IV SOLN
INTRAVENOUS | Status: AC
  Administered 2021-11-14: 1000 mg via INTRAVENOUS
  Filled 2021-11-14: qty 100

## 2021-11-14 MED ORDER — PROCHLORPERAZINE EDISYLATE 10 MG/2ML IJ SOLN: 5.0000 mg | Freq: Four times a day (QID) | INTRAMUSCULAR | Status: DC | PRN

## 2021-11-14 SURGICAL SUPPLY — 61 items
APPLICATOR VISTASEAL 35 (MISCELLANEOUS) IMPLANT
BLADE SURG 15 STRL LF DISP TIS (BLADE) ×1 IMPLANT
BLADE SURG 15 STRL SS (BLADE) ×1
CANNULA REDUC XI 12-8 STAPL (CANNULA) ×1
CANNULA REDUCER 12-8 DVNC XI (CANNULA) ×1 IMPLANT
DERMABOND ADVANCED .7 DNX12 (GAUZE/BANDAGES/DRESSINGS) ×1 IMPLANT
DRAPE 3/4 80X56 (DRAPES) ×1 IMPLANT
DRAPE ARM DVNC X/XI (DISPOSABLE) ×4 IMPLANT
DRAPE COLUMN DVNC XI (DISPOSABLE) ×1 IMPLANT
DRAPE DA VINCI XI ARM (DISPOSABLE) ×4
DRAPE DA VINCI XI COLUMN (DISPOSABLE) ×1
ELECT CAUTERY BLADE 6.4 (BLADE) ×1 IMPLANT
ELECT REM PT RETURN 9FT ADLT (ELECTROSURGICAL) ×1
ELECTRODE REM PT RTRN 9FT ADLT (ELECTROSURGICAL) ×1 IMPLANT
GLOVE BIO SURGEON STRL SZ7 (GLOVE) ×3 IMPLANT
GOWN STRL REUS W/ TWL LRG LVL3 (GOWN DISPOSABLE) ×4 IMPLANT
GOWN STRL REUS W/TWL LRG LVL3 (GOWN DISPOSABLE) ×4
GRASPER LAPSCPC 5X45 DSP (INSTRUMENTS) ×1 IMPLANT
IRRIGATION STRYKERFLOW (MISCELLANEOUS) IMPLANT
IRRIGATOR STRYKERFLOW (MISCELLANEOUS) ×1
IV NS 1000ML (IV SOLUTION) ×1
IV NS 1000ML BAXH (IV SOLUTION) IMPLANT
KIT PINK PAD W/HEAD ARE REST (MISCELLANEOUS) ×1
KIT PINK PAD W/HEAD ARM REST (MISCELLANEOUS) ×1 IMPLANT
KIT TURNOVER CYSTO (KITS) ×1 IMPLANT
LABEL OR SOLS (LABEL) ×1 IMPLANT
MANIFOLD NEPTUNE II (INSTRUMENTS) ×1 IMPLANT
MESH BIO-A 7X10 SYN MAT (Mesh General) IMPLANT
NEEDLE HYPO 22GX1.5 SAFETY (NEEDLE) ×1 IMPLANT
NS IRRIG 500ML POUR BTL (IV SOLUTION) IMPLANT
OBTURATOR OPTICAL STANDARD 8MM (TROCAR) ×1
OBTURATOR OPTICAL STND 8 DVNC (TROCAR) ×1
OBTURATOR OPTICALSTD 8 DVNC (TROCAR) ×1 IMPLANT
PACK LAP CHOLECYSTECTOMY (MISCELLANEOUS) ×1 IMPLANT
PENCIL SMOKE EVACUATOR (MISCELLANEOUS) ×1 IMPLANT
SEAL CANN UNIV 5-8 DVNC XI (MISCELLANEOUS) ×3 IMPLANT
SEAL XI 5MM-8MM UNIVERSAL (MISCELLANEOUS) ×3
SEALER VESSEL DA VINCI XI (MISCELLANEOUS) ×1
SEALER VESSEL EXT DVNC XI (MISCELLANEOUS) ×1 IMPLANT
SOLUTION ELECTROLUBE (MISCELLANEOUS) ×1 IMPLANT
SPIKE FLUID TRANSFER (MISCELLANEOUS) ×1 IMPLANT
SPONGE T-LAP 18X18 ~~LOC~~+RFID (SPONGE) ×1 IMPLANT
STAPLER CANNULA SEAL DVNC XI (STAPLE) ×1 IMPLANT
STAPLER CANNULA SEAL XI (STAPLE) ×1
SUT MNCRL 4-0 (SUTURE) ×1
SUT MNCRL 4-0 27XMFL (SUTURE) ×1
SUT SILK 2 0 SH (SUTURE) ×3 IMPLANT
SUT VIC AB 3-0 SH 27 (SUTURE) ×1
SUT VIC AB 3-0 SH 27X BRD (SUTURE) IMPLANT
SUT VICRYL 0 AB UR-6 (SUTURE) ×2 IMPLANT
SUT VLOC 90 S/L VL9 GS22 (SUTURE) ×1 IMPLANT
SUTURE MNCRL 4-0 27XMF (SUTURE) ×1 IMPLANT
SYR 20ML LL LF (SYRINGE) ×1 IMPLANT
SYR 30ML LL (SYRINGE) ×1 IMPLANT
SYS BAG RETRIEVAL 10MM (BASKET) ×1
SYSTEM BAG RETRIEVAL 10MM (BASKET) IMPLANT
TRAP FLUID SMOKE EVACUATOR (MISCELLANEOUS) ×1 IMPLANT
TRAY FOLEY SLVR 16FR LF STAT (SET/KITS/TRAYS/PACK) ×1 IMPLANT
TROCAR XCEL NON-BLD 5MMX100MML (ENDOMECHANICALS) ×1 IMPLANT
TUBING EVAC SMOKE HEATED PNEUM (TUBING) ×1 IMPLANT
WATER STERILE IRR 500ML POUR (IV SOLUTION) ×1 IMPLANT

## 2021-11-14 NOTE — Anesthesia Preprocedure Evaluation (Addendum)
Anesthesia Evaluation  Patient identified by MRN, date of birth, ID band Patient awake    Reviewed: Allergy & Precautions, NPO status , Patient's Chart, lab work & pertinent test results  History of Anesthesia Complications Negative for: history of anesthetic complications  Airway Mallampati: III  TM Distance: <3 FB Neck ROM: full    Dental  (+) Chipped, Poor Dentition, Caps, Missing   Pulmonary shortness of breath and with exertion, COPD   Pulmonary exam normal        Cardiovascular Exercise Tolerance: Good hypertension, (-) angina (-) Past MI Normal cardiovascular exam     Neuro/Psych  Neuromuscular disease CVA, Residual Symptoms  negative psych ROS   GI/Hepatic Neg liver ROS, hiatal hernia,GERD  Controlled,,  Endo/Other  negative endocrine ROS    Renal/GU      Musculoskeletal   Abdominal   Peds  Hematology negative hematology ROS (+)   Anesthesia Other Findings Past Medical History: No date: Actinic keratosis No date: Anemia No date: Aortic atherosclerosis ([place]) 03/17/2018: Atrial myxoma     Comment:  a.) TTE 03/17/2018 --> 2.4 x 2.5 cm LEFT atrial mass               attached at fossa ovalis/atrial septum; pedunculated in               shape and solid in texture; b.) s/p atrial myoxma               resection 03/21/2018; initially attempted via minimally               invasive RIGHT thoracotomy approach, however due to               bleeding complications, procedure required median               sternotomy and patient going on cardiopulmonary bypass 05/17/2021: Basal cell carcinoma     Comment:  L upper back 5.0 cm lat to spine - ED&C No date: Diastolic dysfunction     Comment:  a.) TTE 03/17/2018: EF 55-60%, LVH, mild PR, G1DD; b.)               TTE 07/25/2018: EF 60-65%, RVSP 44.9, mild-mod MR/TR,               G1DD; c.) TTE 12/18/2018: EF 60-65%; d.) TTE 06/12/2019:               EF 50-55, mild BAE,  triv TR, PASP 37.5, G2DD; e.) TTE               07/22/2020: EF 60-65%, mild MR, mod TR, RVSP 42.7, G1DD;               f.) TTE 05/08/2021: EF 60-65%, mild LVH, mild-mod TR, AoV              sclerosis without stenosis No date: Dyspnea No date: GERD (gastroesophageal reflux disease) No date: Glaucoma 1990: Hemorrhagic cerebrovascular accident (CVA) ([place]) No date: Hiatal hernia No date: History of 2019 novel coronavirus disease (COVID-19)     Comment:  a.) 05/30/20 - Tx'd with "covid 19 pill"; b.) 10/2021 No date: History of blood transfusion 03/21/2018: History of left atrial appendage closure     Comment:  a.) #45 Atricure No date: Hyperlipidemia No date: Hypertension 03/20/2018: Incidental pulmonary nodule, > 40m and < 844m    Comment:  Noted on CT scan - needs f/u imaging 6 months 05/13/2018: NS[place] ([place]ustained [place]cardia[place]  Comment:  a.) holter 05/13/2018 --> 5 short runs 05/13/2018: PAF (paroxysmal atrial fibrillation) ([place])     Comment:  a.) holter 05/13/2018 --> intermittent episodes with               longest lasting approx 1.5 hours (burden <1%). 05/13/2018: PSVT (paroxysmal supraventricular tachycardia)     Comment:  a.) holter 05/13/2018 --> frequent runs of SVT noted               during study No date: Pulmonary fibrosis ([place]) 11/12/2019: Squamous cell carcinoma of skin     Comment:  L distal dorsum forearm - ED&C   Past Surgical History: No date: BUNIONECTOMY; Bilateral 03/21/2018: CLIPPING OF ATRIAL APPENDAGE     Comment:  Procedure: Clipping Of Atrial Appendage - using an               AtriCure 74m clip;  Surgeon: ORexene Alberts MD;                [facility];  Service: Open Heart Surgery;; 09/06/2021: ESOPHAGOGASTRODUODENOSCOPY (EGD) WITH PROPOFOL; N/A     Comment:  Procedure: ESOPHAGOGASTRODUODENOSCOPY (EGD) WITH               PROPOFOL;  Surgeon: AJonathon Bellows MD;  [facility];  Service: Gastroenterology;   Laterality: N/A; 03/21/2018: EXCISION OF ATRIAL MYXOMA; N/A     Comment:  Procedure: Excision Of Atrial Myxoma;  Surgeon: ORexene Alberts MD;  [facility]  Service: Open Heart               Surgery;  Laterality: N/A; No date: RECTOCELE REPAIR 03/19/2018: RIGHT/LEFT HEART CATH AND CORONARY ANGIOGRAPHY; N/A     Comment:  Procedure: RIGHT/LEFT HEART CATH AND CORONARY               ANGIOGRAPHY;  Surgeon: AWellington Hampshire MD;  [facility];  Service: Cardiovascular;                Laterality: N/A; 03/21/2018: TEE WITHOUT CARDIOVERSION; N/A     Comment:  Procedure: TRANSESOPHAGEAL ECHOCARDIOGRAM (TEE);                Surgeon: ORexene Alberts MD;  [place] [facility]                Service: Open Heart Surgery;  Laterality: N/A; No date: TONSILLECTOMY 01/09/1980: TOTAL ABDOMINAL HYSTERECTOMY     Reproductive/Obstetrics negative OB ROS                             Anesthesia Physical Anesthesia Plan  ASA: 3  Anesthesia Plan: General ETT   Post-op Pain Management:    Induction: Intravenous  PONV Risk Score and Plan: Ondansetron, Dexamethasone, Midazolam and Treatment may vary due to age or medical condition  Airway Management Planned: Oral ETT  Additional Equipment:   Intra-op Plan:   Post-operative Plan: Extubation in OR  Informed Consent: I have reviewed the patients History and Physical, chart, labs and discussed the procedure including the risks, benefits and alternatives for the proposed anesthesia with the patient or authorized representative who has indicated his/her understanding and acceptance.  Dental Advisory Given  Plan Discussed with: Anesthesiologist, CRNA and Surgeon  Anesthesia Plan Comments: (Patient consented for risks of anesthesia including but not limited to:  - adverse reactions to medications - damage to eyes, teeth, lips or other oral mucosa - nerve damage due to  positioning  - sore throat or hoarseness - Damage to heart, brain, nerves, lungs, other parts of body or loss of life  Patient voiced understanding.)       Anesthesia Quick Evaluation

## 2021-11-14 NOTE — Op Note (Signed)
Robotic assisted laparoscopic repair of  paraesophageal  hernia with Bio-A Mesh and Partial 865 degree fundoplication  Pre-operative Diagnosis:Giant paraesophageal hernia  Post-operative Diagnosis: same  Procedure:  Robotic assisted laparoscopic repair of  paraesophageal  hernia with Bio-A Mesh and Partial fundoplication  Surgeon: Caroleen Hamman, MD FACS  Assistant: Mallory. Required due to the complexity of the case the need for exposure and lack of first assist.  Anesthesia: Gen. with endotracheal tube  Findings: Giant Type III paraesophageal hernia w all of the stomach within mediastinum Loose wrap 320 degree over 50 FR Bougie   Estimated Blood Loss: 10cc       Specimens: sac           Complications: none   Procedure Details  The patient was seen again in the Holding Room. The benefits, complications, treatment options, and expected outcomes were discussed with the patient. The risks of bleeding, infection, recurrence of symptoms, failure to resolve symptoms,  esophageal damage, Dysphagia, bowel injury, any of which could require further surgery were reviewed with the patient. The likelihood of improving the patient's symptoms with return to their baseline status is good.  The patient and/or family concurred with the proposed plan, giving informed consent.  The patient was taken to Operating Room, identified  and the procedure verified.  A Time Out was held and the above information confirmed.  Prior to the induction of general anesthesia, antibiotic prophylaxis was administered. VTE prophylaxis was in place. General endotracheal anesthesia was then administered and tolerated well. After the induction, the abdomen was prepped with Chloraprep and draped in the sterile fashion. The patient was positioned in the supine position.  Cut down technique was used to enter the abdominal cavity and a Hasson trochar was placed after two vicryl stitches were anchored to the  fascia. Pneumoperitoneum was then created with CO2 and tolerated well without any adverse changes in the patient's vital signs.  Three 8-mm ports were placed under direct vision. All skin incisions  were infiltrated with a local anesthetic agent before making the incision and placing the trocars. An additional 5 mm regular laparoscopic port was placed to assist with retraction and exposure.   The patient was positioned  in reverse Trendelenburg, robot was brought to the surgical field and docked in the standard fashion.  We made sure all the instrumentation was kept indirect view at all times and that there were no collision between the arms. I scrubbed out and went to the console.  I used a robotic arm to retract the liver, the vessel sealer on my right hand and a forced bipolar grasper on my left hand.  There is along the extra 5 mm port allow me ample exposure and the ability to perform meticulous dissection  We Started dividing the lesser omentum via the pars flaccida.  We Were able to dissect the lesser curvature of the stomach and  dissected the fundus free from the right and left crus.  We circumferentially dissected the GE junction.  The hernia sac was also completely reduced and we were able to bring the stomach into the intra-abdominal position.  Attention then was turned to the greater curvature where the short gastrics were divided with sealer device.  We were able to identify the left crus and again were able to make sure there was a good circumferential dissection and that the hernia sac was completely excised.  We did perform a good dissection within the mediastinum to allow a complete reduction of  the sac, gain esophageal length and a to completely allow an intra-abdominal fundoplication. We did not need any esophageal lengthening procedures. Using two strips of Bio-A as pledgets we approximated the crus with a 2-0V lock suture. A bio-A 10x7 cm mesh was inserted and secured using Vistaseal.    We Asked anesthesia to place a 50 French bougie and this went easily.  We also observe trajectory of the bougie. 062 degree fundoplication was created with multiple 2-0 silk sutures and we placed 3 stitches taking some of the esophagus within that bite.  The fundoplication measured approximately 3-1/2 cm and he was floppy. I was very happy with the way the fundoplication laid and the repair of the hernia.  Inspection of the  upper quadrant was performed. No bleeding, bile  Or esophageal injuries leaks, or bowel injuries were noted. Robotic instruments and robotic arms were undocked in the standard fashion. All the needles were removed under direct visualization.   I scrubbed back in.  Pneumoperitoneum was released.  The periumbilical port site was closed with interrumpted 0 Vicryl sutures. 4-0 subcuticular Monocryl was used to close the skin. Liposomal marcaine was injected to all the incisions sites.  Dermabond was  applied.  The patient was then extubated and brought to the recovery room in stable condition. Sponge, lap, and needle counts were correct at closure and at the conclusion of the case.                    Caroleen Hamman, MD, FACS

## 2021-11-14 NOTE — Anesthesia Procedure Notes (Signed)
Procedure Name: Intubation Date/Time: 11/14/2021 7:38 AM  Performed by: Esaw Grandchild, CRNAPre-anesthesia Checklist: Patient identified, Emergency Drugs available, Suction available and Patient being monitored Patient Re-evaluated:Patient Re-evaluated prior to induction Oxygen Delivery Method: Circle system utilized Preoxygenation: Pre-oxygenation with 100% oxygen Induction Type: IV induction Ventilation: Mask ventilation without difficulty Laryngoscope Size: McGraph and 3 Grade View: Grade I Tube type: Oral Tube size: 7.0 mm Number of attempts: 1 Airway Equipment and Method: Stylet, Oral airway and Bite block Placement Confirmation: ETT inserted through vocal cords under direct vision, positive ETCO2 and breath sounds checked- equal and bilateral Secured at: 20 cm Tube secured with: Tape Dental Injury: Teeth and Oropharynx as per pre-operative assessment

## 2021-11-14 NOTE — Progress Notes (Signed)
Patient awake/alert x4. Able to hold cup and drink water without event.  Do have swallowing study today, radiology to call for patient. Of note:  patient with h/o  bradycardia, anesthesia aware. Also h/o HTN does take  medications, sbp 140-170's.  Only c/o is bilateral shoulder pain, medicated as ordered.  Family updated

## 2021-11-14 NOTE — Interval H&P Note (Signed)
History and Physical Interval Note:  11/14/2021 7:21 AM  Wanda Nelson  has presented today for surgery, with the diagnosis of Paraesophageal hernia.  The various methods of treatment have been discussed with the patient and family. After consideration of risks, benefits and other options for treatment, the patient has consented to  Procedure(s): XI ROBOTIC ASSISTED PARAESOPHAGEAL HERNIA REPAIR, RNFA to assist (N/A) as a surgical intervention.  The patient's history has been reviewed, patient examined, no change in status, stable for surgery.  I have reviewed the patient's chart and labs.  Questions were answered to the patient's satisfaction.     Stephen

## 2021-11-14 NOTE — Transfer of Care (Signed)
Immediate Anesthesia Transfer of Care Note  Patient: [name]  Procedure(s) Performed: XI ROBOTIC ASSISTED PARAESOPHAGEAL HERNIA REPAIR, RNFA to assist  Patient Location: PACU  Anesthesia Type:General  Level of Consciousness: drowsy  Airway & Oxygen Therapy: Patient Spontanous Breathing and Patient connected to face mask oxygen  Post-op Assessment: Report given to RN, Post -op Vital signs reviewed and stable, and Patient moving all extremities  Post vital signs: Reviewed and stable  Last Vitals:  Vitals Value Taken Time  BP 151/61 11/14/21 1023  Temp 36.5 C 11/14/21 1023  Pulse 56 11/14/21 1023  Resp 17 11/14/21 1023  SpO2 100 % 11/14/21 1023    Last Pain:  Vitals:   11/14/21 1023  TempSrc:   PainSc: Asleep         Complications: No notable events documented.

## 2021-11-14 NOTE — Anesthesia Postprocedure Evaluation (Signed)
Anesthesia Post Note  Patient: Wanda Nelson  Procedure(s) Performed: XI ROBOTIC ASSISTED PARAESOPHAGEAL HERNIA REPAIR, RNFA to assist  Patient location during evaluation: PACU Anesthesia Type: General Level of consciousness: awake and alert Pain management: pain level controlled Vital Signs Assessment: post-procedure vital signs reviewed and stable Respiratory status: spontaneous breathing, nonlabored ventilation, respiratory function stable and patient connected to nasal cannula oxygen Cardiovascular status: blood pressure returned to baseline and stable Postop Assessment: no apparent nausea or vomiting Anesthetic complications: no   No notable events documented.   Last Vitals:  Vitals:   11/14/21 1145 11/14/21 1220  BP: (!) 164/74 (!) 166/70  Pulse: (!) 52 64  Resp: 20 16  Temp: (!) 36.4 C (!) 36.4 C  SpO2: 97% 99%    Last Pain:  Vitals:   11/14/21 1145  TempSrc:   PainSc: 2                  Precious Haws Shaquella Stamant

## 2021-11-15 ENCOUNTER — Encounter: Payer: Self-pay | Admitting: Surgery

## 2021-11-15 DIAGNOSIS — I10 Essential (primary) hypertension: Secondary | ICD-10-CM | POA: Diagnosis not present

## 2021-11-15 DIAGNOSIS — Z8616 Personal history of COVID-19: Secondary | ICD-10-CM | POA: Diagnosis not present

## 2021-11-15 DIAGNOSIS — K449 Diaphragmatic hernia without obstruction or gangrene: Secondary | ICD-10-CM | POA: Diagnosis not present

## 2021-11-15 DIAGNOSIS — Z8673 Personal history of transient ischemic attack (TIA), and cerebral infarction without residual deficits: Secondary | ICD-10-CM | POA: Diagnosis not present

## 2021-11-15 DIAGNOSIS — Z85828 Personal history of other malignant neoplasm of skin: Secondary | ICD-10-CM | POA: Diagnosis not present

## 2021-11-15 LAB — BASIC METABOLIC PANEL
Anion gap: 6 (ref 5–15)
BUN: 19 mg/dL (ref 8–23)
CO2: 23 mmol/L (ref 22–32)
Calcium: 8.8 mg/dL — ABNORMAL LOW (ref 8.9–10.3)
Chloride: 104 mmol/L (ref 98–111)
Creatinine, Ser: 0.75 mg/dL (ref 0.44–1.00)
GFR, Estimated: >60 mL/min (ref >60)
Glucose, Bld: 117 mg/dL — ABNORMAL HIGH (ref 70–99)
Potassium: 4.2 mmol/L (ref 3.5–5.1)
Sodium: 133 mmol/L — ABNORMAL LOW (ref 135–145)

## 2021-11-15 LAB — CBC
HCT: 36.8 % (ref 36.0–46.0)
Hemoglobin: 12.3 g/dL (ref 12.0–15.0)
MCH: 30.6 pg (ref 26.0–34.0)
MCHC: 33.4 g/dL (ref 30.0–36.0)
MCV: 91.5 fL (ref 80.0–100.0)
Platelets: 165 10*3/uL (ref 150–400)
RBC: 4.02 MIL/uL (ref 3.87–5.11)
RDW: 15.5 % (ref 11.5–15.5)
WBC: 14.8 10*3/uL — ABNORMAL HIGH (ref 4.0–10.5)
nRBC: 0 % (ref 0.0–0.2)

## 2021-11-15 LAB — SURGICAL PATHOLOGY

## 2021-11-15 MED ORDER — ORAL CARE MOUTH RINSE: 15.0000 mL | OROMUCOSAL | Status: DC | PRN

## 2021-11-15 MED ORDER — ONDANSETRON 4 MG PO TBDP: 4.0000 mg | ORAL_TABLET | Freq: Four times a day (QID) | ORAL | 0 refills | Status: DC | PRN

## 2021-11-15 MED ORDER — IBUPROFEN 600 MG PO TABS: 600.0000 mg | ORAL_TABLET | Freq: Four times a day (QID) | ORAL | 0 refills | Status: DC | PRN

## 2021-11-15 MED ORDER — OXYCODONE HCL 5 MG PO TABS: 5.0000 mg | ORAL_TABLET | Freq: Four times a day (QID) | ORAL | 0 refills | Status: DC | PRN

## 2021-11-15 MED ORDER — SILVER NITRATE-POT NITRATE 75-25 % EX MISC
1.0000 | Freq: Once | CUTANEOUS | Status: AC
  Administered 2021-11-15: 1 via TOPICAL
  Filled 2021-11-15: qty 1

## 2021-11-15 NOTE — Discharge Summary (Signed)
[facility] SURGICAL DISCHARGE SUMMARY  Patient ID: Wanda Nelson MRN: [id number] DOB/AGE: [id number] 86 y.o.  Admit date: 11/14/2021 Discharge date: 11/15/2021  Discharge Diagnoses Patient Active Problem List   Diagnosis Date Noted   S/P repair of paraesophageal hernia 11/14/2021   Paraesophageal hernia    Hiatal hernia 05/16/2021    Consultants None  Procedures 11/14/2021 Robotic assisted laparoscopic paraesophageal hernia repair with Nissen Fundoplication   HPI: Wanda Nelson is a 88 y.o. female with history of large paraesophageal hernia who [facility] for scheduled repair.   Hospital Course: Informed consent was obtained and documented, and patient underwent uneventful robotic assisted laparoscopic paraesophageal hernia repair with Nissen fundoplication (Dr Dahlia Byes, [id number]).  Post-operatively, patient did have oozing from umbilical wound which was controlled with silver nitrate and pressure. Otherwise doing remarkably well. Advancement of patient's diet and ambulation were well-tolerated. The remainder of patient's hospital course was essentially unremarkable, and discharge planning was initiated accordingly with patient safely able to be discharged home with appropriate discharge instructions, pain control, and outpatient  follow-up after all of her questions were answered to her expressed satisfaction.   Discharge Condition: Good   Physical Examination:  Constitutional: Well appearing female, NAD Pulmonary: Normal effort, no respiratory distress  Gastrointestinal: Soft, non-tender, non-distended, no rebound/guarding Skin: Umbilical incision with oozing this morning; controlled with silver nitrate and pressure. Otherwise, laparoscopic incisions are CDI with dermabond, no erythema    Allergies as of 11/15/2021       Reactions   Bee Venom Anaphylaxis, Hives   Codeine Other (See Comments)   Altered mental  status Altered mental status   Morphine And Related    Altered mental status   Shellfish Allergy Anaphylaxis, Hives   Mainly shrimp   Bactrim [sulfamethoxazole-trimethoprim]    ? Reaction    Norvasc [amlodipine]    2.5 leg edema   Nitrofurantoin Rash        Medication List     STOP taking these medications    pantoprazole 40 MG tablet Commonly known as: PROTONIX       TAKE these medications    acetaminophen 325 MG tablet Commonly known as: TYLENOL Take 2 tablets (650 mg total) by mouth every 6 (six) hours as needed for mild pain (or Fever >/= 101).   aspirin EC 81 MG tablet Take 1 tablet (81 mg total) by mouth daily.   atorvastatin 20 MG tablet Commonly known as: LIPITOR Take 1 tablet (20 mg total) by mouth daily at 6 PM.   Calcium Carbonate-Vitamin D 500-125 MG-UNIT Tabs Take 1 tablet by mouth daily.   cholecalciferol 1000 units tablet Commonly known as: VITAMIN D Take 1,000 Units by mouth daily.   ferrous sulfate 325 (65 FE) MG tablet Commonly known as: FeroSul Take 1 tablet (325 mg total) by mouth daily with breakfast. With vitamin C 500 mg daily   Fish Oil 1200 MG Caps Take by mouth daily at 8 pm.   Flax Seed Oil 1300 MG Caps Take 1,100 mg by mouth daily.   ibuprofen 600 MG tablet Commonly known as: ADVIL Take 1 tablet (600 mg total) by mouth every 6 (six) hours as needed.   ipratropium-albuterol 0.5-2.5 (3) MG/3ML Soln Commonly known as: DUONEB INHALE THE CONTENTS OF ONE (1) VIAL VIA NEBULIZATION EVERY 6 HOURS AS NEEDED   loratadine 10 MG tablet Commonly known as: CLARITIN Take 1 tablet (10 mg total) by mouth daily. As needed at night   losartan 25 MG  tablet Commonly known as: COZAAR Take 0.5-1 tablets (12.5-25 mg total) by mouth 2 (two) times daily. If BP >140/>80 take losartan 25 mg bid   multivitamin tablet Take 1 tablet by mouth daily.   ondansetron 4 MG disintegrating tablet Commonly known as: ZOFRAN-ODT Take 1 tablet (4 mg total)  by mouth every 6 (six) hours as needed for nausea.   oxyCODONE 5 MG immediate release tablet Commonly known as: Oxy IR/ROXICODONE Take 1 tablet (5 mg total) by mouth every 6 (six) hours as needed for severe pain or breakthrough pain.   VITAMIN C PO Take by mouth daily at 12 noon.          Follow-up Information     Pabon, [place] F, MD. Schedule an appointment as soon as possible for a visit in 2 week(s).   Specialty: General Surgery Why: s/p paraesophageal hernia repair Contact information: [address] [place] [address] [phone]                  Time spent on discharge management including discussion of hospital course, clinical condition, outpatient instructions, prescriptions, and follow up with the patient and members of the medical team: >30 minutes  -- Edison Simon , PA-C [facility]  11/15/2021, 8:15 AM [phone] M-F: 7am - 4pm

## 2021-11-15 NOTE — Discharge Instructions (Signed)
In addition to included general post-operative instructions,  Diet: Recommend following Nissen diet recommendations for at least 4 weeks. You were provided a handout for this.    Activity: No heavy lifting >20 pounds (children, pets, laundry, garbage) or strenuous activity for 4 weeks, but light activity and walking are encouraged. Do not drive or drink alcohol if taking narcotic pain medications or having pain that might distract from driving.  Wound care: 2 days after surgery (11/09), you may shower/get incision wet with soapy water and pat dry (do not rub incisions), but no baths or submerging incision underwater until follow-up.   Medications: Resume all home medications. For mild to moderate pain: acetaminophen (Tylenol) or ibuprofen/naproxen (if no kidney disease). Combining Tylenol with alcohol can substantially increase your risk of causing liver disease. Narcotic pain medications, if prescribed, can be used for severe pain, though may cause nausea, constipation, and drowsiness. Do not combine Tylenol and Percocet (or similar) within a 6 hour period as Percocet (and similar) contain(s) Tylenol. If you do not need the narcotic pain medication, you do not need to fill the prescription.  Call office [phone]) at any time if any questions, worsening pain, fevers/chills, bleeding, drainage from incision site, or other concerns.

## 2021-11-15 NOTE — Progress Notes (Signed)
Wanda Nelson to be D/C'd Home per MD order.  Discussed with the patient and all questions fully answered.  IV catheter discontinued intact. Site without signs and symptoms of complications. Dressing and pressure applied.  An After Visit Summary was printed and given to the patient. Patient prescriptions sent to her pharmacy.  D/c education completed with patient/family including follow up instructions, medication list, d/c activities limitations if indicated, with other d/c instructions as indicated by MD - patient able to verbalize understanding, all questions fully answered.   Patient instructed to return to ED, call 911, or call MD for any changes in condition.   Patient escorted via [place], and D/C home via private auto.  Manuella Ghazi 11/15/2021 2:30 PM

## 2021-11-15 NOTE — TOC Initial Note (Signed)
Transition of Care [facility]) - Initial/Assessment Note    Patient Details  Name: Wanda Nelson MRN: [id number] Date of Birth: July 22, 1931  Transition of Care [facility]) CM/SW Contact:    Beverly Sessions, RN Phone Number: 11/15/2021, 9:05 AM  Clinical Narrative:                       Transition of Care [facility]) Screening Note   Patient Details  Name: Wanda Nelson Date of Birth: 1931-03-15   Transition of Care [facility]) CM/SW Contact:    Beverly Sessions, RN Phone Number: 11/15/2021, 9:05 AM    Transition of Care Department [facility]) has reviewed patient and no TOC needs have been identified at this time. We will continue to monitor patient advancement through interdisciplinary progression rounds. If new patient transition needs arise, please place a TOC consult.     Patient Goals and CMS Choice        Expected Discharge Plan and Services           Expected Discharge Date: 11/15/21                                    Prior Living Arrangements/Services                       Activities of Daily Living Home Assistive Devices/Equipment: None ADL Screening (condition at time of admission) Patient's cognitive ability adequate to safely complete daily activities?: Yes Is the patient deaf or have difficulty hearing?: No Does the patient have difficulty seeing, even when wearing glasses/contacts?: No Does the patient have difficulty concentrating, remembering, or making decisions?: No Patient able to express need for assistance with ADLs?: No Does the patient have difficulty dressing or bathing?: No Independently performs ADLs?: Yes (appropriate for developmental age) Does the patient have difficulty walking or climbing stairs?: No Weakness of Legs: None Weakness of Arms/Hands: None  Permission Sought/Granted                  Emotional Assessment              Admission diagnosis:  S/P repair of paraesophageal hernia [R44.315,  Z87.19] Patient Active Problem List   Diagnosis Date Noted   S/P repair of paraesophageal hernia 11/14/2021   Positive self-administered antigen test for COVID-19 10/18/2021   Paraesophageal hernia    Hiatal hernia 05/16/2021   Allergic rhinitis 05/16/2021   Hypoxia    Pulmonary fibrosis ([place]) 05/08/2021   History of hiatal hernia    Essential hypertension    Dyspnea    Hearing loss of left ear 11/16/2020   Lung fibrosis ([place]) 11/16/2020   Aortic atherosclerosis ([place]) 11/16/2020   Pain due to onychomycosis of toenails of both feet 07/28/2020   Viral URI 05/31/2020   Iron deficiency anemia 07/31/2018   Gastroesophageal reflux disease 07/31/2018   Atherosclerosis 07/31/2018   Incidental pulmonary nodule, > 53m and < 847m03/12/2018   s/p resection left atrial myxoma 03/19/2018   HLD (hyperlipidemia) 03/15/2015   History of stroke 03/15/2015   Glaucoma 03/15/2015   PCP:  WaCarollee LeitzMD Pharmacy:   [facility] [id number]: [phone]: [phone] [id number]: [phone]: [id number]  Lorina Rabon [place] [address] [place] [address] Phone: [phone] Fax: [phone]     Social Determinants of Health (SDOH) Interventions    Readmission Risk Interventions     No data to display

## 2021-11-16 ENCOUNTER — Ambulatory Visit: Payer: Medicare Other | Admitting: Gastroenterology

## 2021-11-29 ENCOUNTER — Encounter: Payer: Self-pay | Admitting: Surgery

## 2021-11-29 ENCOUNTER — Ambulatory Visit (INDEPENDENT_AMBULATORY_CARE_PROVIDER_SITE_OTHER): Payer: Medicare Other | Admitting: Surgery

## 2021-11-29 VITALS — BP 157/74 | HR 57 | Temp 97.7°F | Wt 114.6 lb

## 2021-11-29 DIAGNOSIS — K449 Diaphragmatic hernia without obstruction or gangrene: Secondary | ICD-10-CM

## 2021-11-29 DIAGNOSIS — Z09 Encounter for follow-up examination after completed treatment for conditions other than malignant neoplasm: Secondary | ICD-10-CM

## 2021-11-29 NOTE — Patient Instructions (Addendum)
If you have any concerns or questions, please feel free to call our office.   Eating Plan After Nissen Fundoplication After a Nissen fundoplication procedure, it is common to have some difficulty swallowing. The part of your body that moves food and liquid from your mouth to your stomach (esophagus) will be swollen and may feel tight. It will take several weeks or months for your esophagus and stomach to heal. By following a special eating plan, you can prevent problems such as pain, swelling or pressure in the abdomen (bloating), gas, nausea, or diarrhea. What are tips for following this plan? Cooking Cook all foods until they are soft. Remove skins and seeds from fruits and vegetables before eating. Remove skin and gristle from meats. Grind or finely mince meats before eating. Avoid over-cooking meat. Dry, tough meat is more difficult to swallow. Avoid using oil when cooking, or use only a small amount of oil. Avoid using seasoning when cooking, or use only a small amount of seasoning. Toast bread before eating. This makes it easier to swallow. Meal planning  Eat 6-8 small meals throughout the day. Right after the surgery, have a few meals that are only clear liquids. Clear liquids include: Water. Clear fruit juice, no pulp. Chicken, beef, or vegetable broth. Gelatin. Decaffeinated tea or coffee without milk. Popsicles or shaved ice. Depending on your progress, you may move to a full liquid diet as told by your health care provider. This includes clear liquids and the following: Dairy and alternative milks, such as soy milk. Strained creamed soups. Ice cream or sherbet. Pudding. Nutritional supplement drinks. Yogurt. A few days after surgery, you may be able to start eating a diet of soft foods. You may need to eat according to this plan for several weeks. Do not eat sweets or sweetened drinks at the beginning of a meal. Doing that may cause your stomach to empty faster than it  should (dumping syndrome). Lifestyle Always sit upright when eating or drinking. Eat slowly. Take small bites and chew food well before swallowing. Do not lie down after eating. Stay sitting up for 30 minutes or longer after each meal. Sip fluids between meals. Limit how much you drink at one time. With meals and snacks, have 4-8 oz (120-240 mL). This is equal to  cup-1 cup. Do not mix solid foods and liquids in the same mouthful. Drink enough fluid to keep your urine pale yellow. Do not chew gum or drink fluids through a straw. Doing those things may cause you to swallow extra air. General information Do not drink carbonated drinks or alcohol. Avoid foods and drinks that contain caffeine and chocolate. Avoid foods and drinks that contain citrus or tomato. Allow hot soups and drinks to cool before eating. Avoid foods that cause gas, such as beans, peas, broccoli, or cabbage. If dairy milk products cause diarrhea, avoid them or eat them in small amounts. Recommended foods Fruits Any soft-cooked fruits after skins and seeds are removed. Fruit juice. Vegetables Any soft-cooked vegetables after skins and seeds are removed. Vegetable juice. Grains Cooked cereals. Dry cereals softened with liquid. Cooked pasta, rice, or other grains. Toasted bread. Bland crackers, such as soda or graham crackers. Meats and other protein foods Tender cuts of meat, poultry, or fish after bones, skin, and gristle are removed. Poached, boiled, or scrambled eggs. Canned fish. Tofu. Creamy nut butters. Dairy Milk. Yogurt. Cottage cheese. Mild cheeses. Beverages Nutritional supplement drinks. Decaffeinated tea or coffee. Sports drinks. Fats and oils Butter. Margarine.  Mayonnaise. Vegetable oil. Smooth salad dressing. Sweets and desserts Plain hard candy. Marshmallows. Pudding. Ice cream. Gelatin. Sherbet. Seasoning and other foods Salt. Light seasonings. Mustard. Vinegar. The items listed above may not be a  complete list of recommended foods and beverages. Contact a dietitian for more information. Foods to avoid Fruits Oranges. Grapefruit. Lemons. Limes. Citrus juices. Dried fruit. Crunchy, raw fruits. Vegetables Tomato sauce. Tomato juice. Broccoli. Cauliflower. Cabbage. Brussels sprouts. Crunchy, raw vegetables. Grains High-fiber or bran cereal. Cereal with nuts, dried fruit, or coconut. Sweet breads, rolls, coffee cake, or donuts. Chewy or crusty breads. Popcorn. Meats and other protein foods Beans, peas, and lentils. Tough or fatty meats. Fried meats, chicken, or fish. Fried eggs. Nuts and seeds. Crunchy nut butters. Dairy Chocolate milk. Yogurt with chunks of fruit, nuts, seeds, or coconut. Strong cheeses. Beverages Carbonated soft drinks. Alcohol. Cocoa. Hot drinks. Fats and oils Bacon fat. Lard. Sweets and desserts Chocolate. Candy with nuts, coconut, or seeds. Peppermint. Cookies. Cakes. Pie crust. Seasoning and other foods Heavy seasonings. Chili sauce. Ketchup. Barbecue sauce. Angie Fava. Horseradish. The items listed above may not be a complete list of foods and beverages to avoid. Contact a dietitian for more information. Summary Following this eating plan after a Nissen fundoplication is an important part of healing after surgery. After surgery, you will start with a clear liquid diet before you progress to full liquids and soft foods. You may need to eat soft foods for several weeks. Avoid eating foods that cause irritation, gas, nausea, diarrhea, or swelling or pressure in the abdomen (bloating), and avoid foods that are difficult to swallow. Talk with a dietitian about which dietary choices are best for you. This information is not intended to replace advice given to you by your health care provider. Make sure you discuss any questions you have with your health care provider. Document Revised: 07/12/2019 Document Reviewed: 07/12/2019 Elsevier Patient Education  [place].

## 2021-11-29 NOTE — Progress Notes (Signed)
Wanda Nelson is 2 weeks out from paraesophageal hernia repair.  She has done remarkably well.  She is on a full liquid diet and is tolerating it well.  No evidence of dysphagia.  She has had dramatic improvement in his pulmonary symptoms and now she is able to complete full sentence without having some dyspnea.  SHe has also resumed her walking routine and was able to achieve 1-1/2 miles of walking.  She endorses minimal abdominal discomfort.  No fevers no chills.   PE: NAD alert Chest : CTA, NSR, s1,s2 Abd: soft, nt. Incisions c/d/i   A/P doing very well after paraesophageal hernia considering her age.  Discussed with her in detail about adherence to Nissen diet.  May do.  May do excellent may do fish and Mushy diet. I will see her back in a couple months.

## 2021-12-11 ENCOUNTER — Encounter: Payer: Self-pay | Admitting: Family

## 2021-12-11 ENCOUNTER — Ambulatory Visit
Admission: RE | Admit: 2021-12-11 | Discharge: 2021-12-11 | Disposition: A | Discharge status: Home / Self Care | Payer: Medicare Other | Source: Ambulatory Visit | Attending: Family | Admitting: Family

## 2021-12-11 ENCOUNTER — Ambulatory Visit (INDEPENDENT_AMBULATORY_CARE_PROVIDER_SITE_OTHER): Payer: Medicare Other | Admitting: Family

## 2021-12-11 VITALS — BP 118/78 | HR 80 | Temp 98.7°F | Wt 111.8 lb

## 2021-12-11 DIAGNOSIS — R5383 Other fatigue: Secondary | ICD-10-CM

## 2021-12-11 DIAGNOSIS — D509 Iron deficiency anemia, unspecified: Secondary | ICD-10-CM | POA: Diagnosis not present

## 2021-12-11 DIAGNOSIS — R0609 Other forms of dyspnea: Secondary | ICD-10-CM | POA: Diagnosis not present

## 2021-12-11 DIAGNOSIS — R079 Chest pain, unspecified: Secondary | ICD-10-CM | POA: Diagnosis not present

## 2021-12-11 DIAGNOSIS — R0600 Dyspnea, unspecified: Secondary | ICD-10-CM | POA: Diagnosis not present

## 2021-12-11 LAB — IBC + FERRITIN
Ferritin: 277.4 ng/mL (ref 10.0–291.0)
Iron: 29 ug/dL — ABNORMAL LOW (ref 42–145)
Saturation Ratios: 11.1 % — ABNORMAL LOW (ref 20.0–50.0)
TIBC: 260.4 ug/dL (ref 250.0–450.0)
Transferrin: 186 mg/dL — ABNORMAL LOW (ref 212.0–360.0)

## 2021-12-11 LAB — TSH: TSH: 1.27 u[IU]/mL (ref 0.35–5.50)

## 2021-12-11 LAB — CBC WITH DIFFERENTIAL/PLATELET
Basophils Absolute: 0.1 10*3/uL (ref 0.0–0.1)
Basophils Relative: 0.8 % (ref 0.0–3.0)
Eosinophils Absolute: 0.9 10*3/uL — ABNORMAL HIGH (ref 0.0–0.7)
Eosinophils Relative: 8.6 % — ABNORMAL HIGH (ref 0.0–5.0)
HCT: 42 % (ref 36.0–46.0)
Hemoglobin: 14 g/dL (ref 12.0–15.0)
Lymphocytes Relative: 14.2 % (ref 12.0–46.0)
Lymphs Abs: 1.5 10*3/uL (ref 0.7–4.0)
MCHC: 33.4 g/dL (ref 30.0–36.0)
MCV: 95.6 fl (ref 78.0–100.0)
Monocytes Absolute: 0.9 10*3/uL (ref 0.1–1.0)
Monocytes Relative: 8.8 % (ref 3.0–12.0)
Neutro Abs: 7.2 10*3/uL (ref 1.4–7.7)
Neutrophils Relative %: 67.6 % (ref 43.0–77.0)
Platelets: 223 10*3/uL (ref 150.0–400.0)
RBC: 4.39 Mil/uL (ref 3.87–5.11)
RDW: 14.6 % (ref 11.5–15.5)
WBC: 10.6 10*3/uL — ABNORMAL HIGH (ref 4.0–10.5)

## 2021-12-11 LAB — COMPREHENSIVE METABOLIC PANEL
ALT: 10 U/L (ref 0–35)
AST: 17 U/L (ref 0–37)
Albumin: 4 g/dL (ref 3.5–5.2)
Alkaline Phosphatase: 93 U/L (ref 39–117)
BUN: 14 mg/dL (ref 6–23)
CO2: 27 mEq/L (ref 19–32)
Calcium: 9.3 mg/dL (ref 8.4–10.5)
Chloride: 102 mEq/L (ref 96–112)
Creatinine, Ser: 0.68 mg/dL (ref 0.40–1.20)
GFR: 77.6 mL/min (ref >60.00)
Glucose, Bld: 101 mg/dL — ABNORMAL HIGH (ref 70–99)
Potassium: 4.2 mEq/L (ref 3.5–5.1)
Sodium: 138 mEq/L (ref 135–145)
Total Bilirubin: 0.6 mg/dL (ref 0.2–1.2)
Total Protein: 7.3 g/dL (ref 6.0–8.3)

## 2021-12-11 LAB — B12 AND FOLATE PANEL
Folate: >23.8 ng/mL (ref >5.9)
Vitamin B-12: 728 pg/mL (ref 211–911)

## 2021-12-11 LAB — VITAMIN D 25 HYDROXY (VIT D DEFICIENCY, FRACTURES): VITD: 50.37 ng/mL (ref 30.00–100.00)

## 2021-12-11 MED ORDER — IOHEXOL 350 MG/ML SOLN
75.0000 mL | Freq: Once | INTRAVENOUS | Status: AC | PRN
  Administered 2021-12-11: 75 mL via INTRAVENOUS

## 2021-12-11 NOTE — Progress Notes (Signed)
Subjective:    Patient ID: Wanda Nelson, female    DOB: 04-29-1931, 87 y.o.   MRN: [id number]  CC: Wanda Nelson is a 88 y.o. female who presents today for an acute visit.    HPI: Patient here today complaining of fatigue, doe.   She had been 'doing well' and then 5 days ago felt tired. She was walking 2 miles per day and then 'walking was hard'.  She would have to stop after few minutes.  She would become exhausted while baking which is unusual for her  She notes she had stopped taking iron 4 weeks ago which she had been on sine 2014.   She has SOB for years however increased. No leg swelling, CP, palpitations, dizzy, lightheadedness.  Denies URI symptoms.  She is a history of COVID this year.   SOB when speaking is better.   Sleeps well. Denies depression.    [facility] at [facility].     History of aortic atherosclerosis, hypertension, lung fibrosis, GERD, IDA   A recent paraesophageal hernia repair with Dr Dahlia Byes 11/14/21  Follow-up Dr Fletcher Anon 08/10/2021. H/o atrial myxoma resection 03/2018. History of postoperative atrial fibrillation She has been in sinus rhythm since.   Echocardiogram 05/2021 left ventricular ejection fraction 60 to 65%.  Mild left ventricular hypertrophy.  Mild to moderate tricuspid valve regurgitation  CT of the chest with evidence of pulmonary fibrosis 05/08/21  Dr. Vicente Males performed EGD 09/06/2021 which revealed large hiatal hernia   Hemoglobin 12.3 , 3 weeks ago  utd mammogram  Establish care Dr. Volanda Napoleon 01/04/2022  HISTORY:  Past Medical History:  Diagnosis Date   Actinic keratosis    Anemia    Aortic atherosclerosis ([place])    Atrial myxoma 03/17/2018   a.) TTE 03/17/2018 --> 2.4 x 2.5 cm LEFT atrial mass attached at fossa ovalis/atrial septum; pedunculated in shape and solid in texture; b.) s/p atrial myoxma resection 03/21/2018; initially attempted via minimally invasive RIGHT thoracotomy approach, however due to bleeding  complications, procedure required median sternotomy and patient going on cardiopulmonary bypass   Basal cell carcinoma 05/17/2021   L upper back 5.0 cm lat to spine - ED&C   Diastolic dysfunction    a.) TTE 03/17/2018: EF 55-60%, LVH, mild PR, G1DD; b.) TTE 07/25/2018: EF 60-65%, RVSP 44.9, mild-mod MR/TR, G1DD; c.) TTE 12/18/2018: EF 60-65%; d.) TTE 06/12/2019: EF 50-55, mild BAE, triv TR, PASP 37.5, G2DD; e.) TTE 07/22/2020: EF 60-65%, mild MR, mod TR, RVSP 42.7, G1DD; f.) TTE 05/08/2021: EF 60-65%, mild LVH, mild-mod TR, AoV sclerosis without stenosis   Dyspnea    GERD (gastroesophageal reflux disease)    Glaucoma    Hemorrhagic cerebrovascular accident (CVA) ([place]) 1990   Hiatal hernia    History of 2019 novel coronavirus disease (COVID-19)    a.) 05/30/20 - Tx'd with "covid 19 pill"; b.) 10/2021   History of blood transfusion    History of left atrial appendage closure 03/21/2018   a.) #45 Atricure   Hyperlipidemia    Hypertension    Incidental pulmonary nodule, > 21m and < 854m03/12/2018   Noted on CT scan - needs f/u imaging 6 months   NSVT (nonsustained ventricular tachycardia) ([place]/05[place]   a.) holter 05/13/2018 --> 5 short runs   PAF (paroxysmal atrial fibrillation) ([place]/05[place]   a.) holter 05/13/2018 --> intermittent episodes with longest lasting approx 1.5 hours (burden <1%).   PSVT (paroxysmal supraventricular tachycardia) 05/13/2018   a.) holter 05/13/2018 --> frequent  runs of SVT noted during study   Pulmonary fibrosis ([place])    Squamous cell carcinoma of skin 11/12/2019   L distal dorsum forearm - ED&C    Past Surgical History:  Procedure Laterality Date   BUNIONECTOMY Bilateral    CLIPPING OF ATRIAL APPENDAGE  03/21/2018   Procedure: Clipping Of Atrial Appendage - using an AtriCure 93m clip;  Surgeon: ORexene Alberts MD;  [place]  Service: Open Heart Surgery;;   ESOPHAGOGASTRODUODENOSCOPY (EGD) WITH PROPOFOL N/A 09/06/2021   Procedure:  ESOPHAGOGASTRODUODENOSCOPY (EGD) WITH PROPOFOL;  Surgeon: AJonathon Bellows MD;  [facility];  Service: Gastroenterology;  Laterality: N/A;   EXCISION OF ATRIAL MYXOMA N/A 03/21/2018   Procedure: Excision Of Atrial Myxoma;  Surgeon: ORexene Alberts MD;  [place]  Service: Open Heart Surgery;  Laterality: N/A;   RECTOCELE REPAIR     RIGHT/LEFT HEART CATH AND CORONARY ANGIOGRAPHY N/A 03/19/2018   Procedure: RIGHT/LEFT HEART CATH AND CORONARY ANGIOGRAPHY;  Surgeon: AWellington Hampshire MD;  [facility];  Service: Cardiovascular;  Laterality: N/A;   TEE WITHOUT CARDIOVERSION N/A 03/21/2018   Procedure: TRANSESOPHAGEAL ECHOCARDIOGRAM (TEE);  Surgeon: ORexene Alberts MD;  [place]  Service: Open Heart Surgery;  Laterality: N/A;   TONSILLECTOMY     TOTAL ABDOMINAL HYSTERECTOMY  01/09/1980   XI ROBOTIC ASSISTED PARAESOPHAGEAL HERNIA REPAIR N/A 11/14/2021   Procedure: XI ROBOTIC ASSISTED PARAESOPHAGEAL HERNIA REPAIR, RNFA to assist;  Surgeon: PJules Husbands MD;  Location: [facility];  Service: General;  Laterality: N/A;   Family History  Problem Relation Age of Onset   Colon cancer Brother    Cancer Brother        neck tumor cancerous    Esophageal varices Brother    Colon cancer Maternal Uncle    Colon cancer Maternal Grandmother    Kidney disease Brother    Coronary artery disease Brother        with stent, cabg   Breast cancer Neg Hx     Allergies: Bee venom, Codeine, Morphine and related, Shellfish allergy, Bactrim [sulfamethoxazole-trimethoprim], Norvasc [amlodipine], and Nitrofurantoin Current Outpatient Medications on File Prior to Visit  Medication Sig Dispense Refill   acetaminophen (TYLENOL) 325 MG tablet Take 2 tablets (650 mg total) by mouth every 6 (six) hours as needed for mild pain (or Fever >/= 101).     Ascorbic Acid (VITAMIN C PO) Take by mouth daily at 12 noon.     aspirin EC 81 MG tablet Take 1 tablet (81 mg total) by mouth daily. 90  tablet 3   atorvastatin (LIPITOR) 20 MG tablet Take 1 tablet (20 mg total) by mouth daily at 6 PM. 90 tablet 3   Calcium Carbonate-Vitamin D 500-125 MG-UNIT TABS Take 1 tablet by mouth daily.     cholecalciferol (VITAMIN D) 1000 units tablet Take 1,000 Units by mouth daily.     Flaxseed, Linseed, (FLAX SEED OIL) 1300 MG CAPS Take 1,100 mg by mouth daily.      loratadine (CLARITIN) 10 MG tablet Take 1 tablet (10 mg total) by mouth daily. As needed at night 90 tablet 3   losartan (COZAAR) 25 MG tablet Take 0.5-1 tablets (12.5-25 mg total) by mouth 2 (two) times daily. If BP >140/>80 take losartan 25 mg bid 180 tablet 3   Multiple Vitamin (MULTIVITAMIN) tablet Take 1 tablet by mouth daily.     ferrous sulfate (FEROSUL) 325 (65 FE) MG tablet Take 1 tablet (325 mg total)  by mouth daily with breakfast. With vitamin C 500 mg daily (Patient not taking: Reported on 12/11/2021) 90 tablet 3   ipratropium-albuterol (DUONEB) 0.5-2.5 (3) MG/3ML SOLN INHALE THE CONTENTS OF ONE (1) VIAL VIA NEBULIZATION EVERY 6 HOURS AS NEEDED (Patient not taking: Reported on 12/11/2021) 360 mL 0   Omega-3 Fatty Acids (FISH OIL) 1200 MG CAPS Take by mouth daily at 8 pm. (Patient not taking: Reported on 12/11/2021)     No current facility-administered medications on file prior to visit.    Social History   Tobacco Use   Smoking status: Never   Smokeless tobacco: Never  Vaping Use   Vaping Use: Never used  Substance Use Topics   Alcohol use: No    Alcohol/week: 0.0 standard drinks of alcohol   Drug use: No    Review of Systems  Constitutional:  Positive for fatigue. Negative for chills and fever.  Respiratory:  Positive for shortness of breath. Negative for cough and wheezing.   Cardiovascular:  Negative for chest pain and palpitations.  Gastrointestinal:  Negative for nausea and vomiting.      Objective:    BP 118/78   Pulse 80   Temp 98.7 F (37.1 C) (Oral)   Wt 111 lb 12.8 oz (50.7 kg)   LMP  (LMP Unknown)    SpO2 97%   BMI 21.12 kg/m  Wt Readings from Last 3 Encounters:  12/11/21 111 lb 12.8 oz (50.7 kg)  11/29/21 114 lb 9.6 oz (52 kg)  11/06/21 119 lb 6.4 oz (54.2 kg)     Physical Exam Vitals reviewed.  Constitutional:      Appearance: She is well-developed.  Eyes:     Conjunctiva/sclera: Conjunctivae normal.  Cardiovascular:     Rate and Rhythm: Normal rate and regular rhythm.     Pulses: Normal pulses.     Heart sounds: Normal heart sounds.  Pulmonary:     Effort: Pulmonary effort is normal.     Breath sounds: Examination of the right-lower field reveals decreased breath sounds. Examination of the left-lower field reveals decreased breath sounds. Decreased breath sounds present. No wheezing, rhonchi or rales.  Musculoskeletal:     Right lower leg: No edema.     Left lower leg: No edema.  Skin:    General: Skin is warm and dry.  Neurological:     Mental Status: She is alert.  Psychiatric:        Speech: Speech normal.        Behavior: Behavior normal.        Thought Content: Thought content normal.        Assessment & Plan:   Problem List Items Addressed This Visit       Other   DOE (dyspnea on exertion) - Primary    Fortunately patient is well-appearing today.  HR 80.  She appears slightly labored in her speech although she describes this symptom as being better.  Walking down the hall SaO2 remained above 90% at the lowest it dropped down to 91% and return to 95% while walking and speaking with me.  She denied any shortness of breath, dizziness while we were walking.  PERC criteria cannot be used to r/u PE. Well criteria low risk.  Concern for change in 5 days with increased DOE. Recent surgery.  Pending stat CT angio to r/u PE. She also has recent h/o pulmonary fibrosis.Consider pulmonology referral. Pending thorough lab investigation as well due to h/o anemia.  Relevant Orders   TSH   CBC with Differential/Platelet   Comprehensive metabolic panel   VITAMIN  D 25 Hydroxy (Vit-D Deficiency, Fractures)   B12 and Folate Panel   IBC + Ferritin   CT Angio Chest W/Cm &/Or Wo Cm   Iron deficiency anemia   Relevant Orders   B12 and Folate Panel   IBC + Ferritin   Other Visit Diagnoses     Fatigue, unspecified type       Relevant Orders   TSH   CBC with Differential/Platelet   Comprehensive metabolic panel   VITAMIN D 25 Hydroxy (Vit-D Deficiency, Fractures)   B12 and Folate Panel   IBC + Ferritin   CT Angio Chest W/Cm &/Or Wo Cm   Serum calcium elevated       Relevant Orders   VITAMIN D 25 Hydroxy (Vit-D Deficiency, Fractures)         I am having Roxi P. Millman maintain her multivitamin, Flax Seed Oil, Calcium Carbonate-Vitamin D, cholecalciferol, Ascorbic Acid (VITAMIN C PO), Fish Oil, acetaminophen, aspirin EC, ipratropium-albuterol, atorvastatin, ferrous sulfate, loratadine, and losartan.   No orders of the defined types were placed in this encounter.   Return precautions given.   Risks, benefits, and alternatives of the medications and treatment plan prescribed today were discussed, and patient expressed understanding.   Education regarding symptom management and diagnosis given to patient on AVS.  Continue to follow with Carollee Leitz, MD for routine health maintenance.   Wanda Nelson and I agreed with plan.   Mable Paris, FNP

## 2021-12-11 NOTE — Patient Instructions (Signed)
We are working on scheduling your CT angio  We have ordered labs and will certainly call you with results.  Please let me know how you are doing and certainly if any symptoms were to acutely change.

## 2021-12-11 NOTE — Assessment & Plan Note (Signed)
Fortunately patient is well-appearing today.  HR 80.  She appears slightly labored in her speech although she describes this symptom as being better.  Walking down the hall SaO2 remained above 90% at the lowest it dropped down to 91% and return to 95% while walking and speaking with me.  She denied any shortness of breath, dizziness while we were walking.  PERC criteria cannot be used to r/u PE. Well criteria low risk.  Concern for change in 5 days with increased DOE. Recent surgery.  Pending stat CT angio to r/u PE. She also has recent h/o pulmonary fibrosis.Consider pulmonology referral. Pending thorough lab investigation as well due to h/o anemia.

## 2021-12-12 ENCOUNTER — Telehealth: Payer: Self-pay | Admitting: Family Medicine

## 2021-12-12 ENCOUNTER — Ambulatory Visit
Admission: EM | Admit: 2021-12-12 | Discharge: 2021-12-12 | Disposition: A | Discharge status: Home / Self Care | Payer: Medicare Other

## 2021-12-12 DIAGNOSIS — R5383 Other fatigue: Secondary | ICD-10-CM

## 2021-12-12 NOTE — Telephone Encounter (Signed)
Patient's daughter in law called, she called patient and states that patient told her she can not get out of chair she has no energy.

## 2021-12-12 NOTE — ED Triage Notes (Signed)
Pt. Presents to UC w/ c/o increasing fatigue for the past 5 days. 4 weeks ago patient states she had a hiatal hernia removed that affected her CBC and iron levels and caused her to be extremely fatigued. After having the surgery her fatigue has returned. Pt. Was seen yesterday by her PCP and had blood work and a CT done. Pt. Is requesting the results of her blood work.

## 2021-12-12 NOTE — ED Provider Notes (Signed)
Wanda Nelson    CSN: [id number] Arrival date & time: 12/12/21  1604      History   Chief Complaint Chief Complaint  Patient presents with   Fatigue    HPI Wanda Nelson is a 88 y.o. female.   HPI  Patient is accompanied by her children 1 of which is a retired Building services engineer.  They are here to request description of the results of her recent lab testing.  Patient was seen and evaluated for a complaint of acute fatigue.  Patient states she normally walks about 3 miles a day without difficulty but had to stop after walking 1 mile recently.  She reports frequently making cookies and having to frequently stop for breaks and the process taking more than 4 hours.  Patient recently had surgery for repair of hiatal hernia which was causing other issues including with eating.  She reports not being able to swallow very well and is on a soft diet.  She states she has difficulty swallowing pills.  As a result she does not eat a lot of foods and tries to augment her calorie intake with boost.  She states she uses boost every other day because if she takes it more frequently it causes diarrhea.  She is careful about staying hydrated and replacing electrolytes and uses an electrolyte replacement water.  Past Medical History:  Diagnosis Date   Actinic keratosis    Anemia    Aortic atherosclerosis ([place])    Atrial myxoma 03/17/2018   a.) TTE 03/17/2018 --> 2.4 x 2.5 cm LEFT atrial mass attached at fossa ovalis/atrial septum; pedunculated in shape and solid in texture; b.) s/p atrial myoxma resection 03/21/2018; initially attempted via minimally invasive RIGHT thoracotomy approach, however due to bleeding complications, procedure required median sternotomy and patient going on cardiopulmonary bypass   Basal cell carcinoma 05/17/2021   L upper back 5.0 cm lat to spine - ED&C   Diastolic dysfunction    a.) TTE 03/17/2018: EF 55-60%, LVH, mild PR, G1DD; b.) TTE 07/25/2018: EF 60-65%,  RVSP 44.9, mild-mod MR/TR, G1DD; c.) TTE 12/18/2018: EF 60-65%; d.) TTE 06/12/2019: EF 50-55, mild BAE, triv TR, PASP 37.5, G2DD; e.) TTE 07/22/2020: EF 60-65%, mild MR, mod TR, RVSP 42.7, G1DD; f.) TTE 05/08/2021: EF 60-65%, mild LVH, mild-mod TR, AoV sclerosis without stenosis   Dyspnea    GERD (gastroesophageal reflux disease)    Glaucoma    Hemorrhagic cerebrovascular accident (CVA) ([place]) 1990   Hiatal hernia    History of 2019 novel coronavirus disease (COVID-19)    a.) 05/30/20 - Tx'd with "covid 19 pill"; b.) 10/2021   History of blood transfusion    History of left atrial appendage closure 03/21/2018   a.) #45 Atricure   Hyperlipidemia    Hypertension    Incidental pulmonary nodule, > 88m and < 825m03/12/2018   Noted on CT scan - needs f/u imaging 6 months   NSVT (nonsustained ventricular tachycardia) [place]   a.) holter 05/13/2018 --> 5 short runs   PAF (paroxysmal atrial fibrillation) ([facility]/05/2018   a.) holter 05/13/2018 --> intermittent episodes with longest lasting approx 1.5 hours (burden <1%).   PSVT (paroxysmal supraventricular tachycardia) 05/13/2018   a.) holter 05/13/2018 --> frequent runs of SVT noted during study   Pulmonary fibrosis ([facility]   Squamous cell carcinoma of skin 11/12/2019   L distal dorsum forearm - ED&C     Patient Active Problem List   Diagnosis Date Noted  S/P repair of paraesophageal hernia 11/14/2021   Positive self-administered antigen test for COVID-19 10/18/2021   Paraesophageal hernia    Hiatal hernia 05/16/2021   Allergic rhinitis 05/16/2021   Hypoxia    Pulmonary fibrosis ([place]) 05/08/2021   History of hiatal hernia    Essential hypertension    DOE (dyspnea on exertion)    Hearing loss of left ear 11/16/2020   Lung fibrosis ([place]) 11/16/2020   Aortic atherosclerosis ([place]) 11/16/2020   Pain due to onychomycosis of toenails of both feet 07/28/2020   Viral URI 05/31/2020   Iron deficiency anemia 07/31/2018    Gastroesophageal reflux disease 07/31/2018   Atherosclerosis 07/31/2018   Incidental pulmonary nodule, > 54m and < 859m03/12/2018   s/p resection left atrial myxoma 03/19/2018   HLD (hyperlipidemia) 03/15/2015   History of stroke 03/15/2015   Glaucoma 03/15/2015    Past Surgical History:  Procedure Laterality Date   BUNIONECTOMY Bilateral    CLIPPING OF ATRIAL APPENDAGE  03/21/2018   Procedure: Clipping Of Atrial Appendage - using an AtriCure 4531mlip;  Surgeon: OweRexene AlbertsD;  [facility];;   ESOPHAGOGASTRODUODENOSCOPY (EGD) WITH PROPOFOL N/A 09/06/2021   Procedure: ESOPHAGOGASTRODUODENOSCOPY (EGD) WITH PROPOFOL;  Surgeon: AnnJonathon BellowsD;  [facility];  Service: Gastroenterology;  Laterality: N/A;   EXCISION OF ATRIAL MYXOMA N/A 03/21/2018   Procedure: Excision Of Atrial Myxoma;  Surgeon: OweRexene AlbertsD;  [facility];  Laterality: N/A;   RECTOCELE REPAIR     RIGHT/LEFT HEART CATH AND CORONARY ANGIOGRAPHY N/A 03/19/2018   Procedure: RIGHT/LEFT HEART CATH AND CORONARY ANGIOGRAPHY;  Surgeon: AriWellington HampshireD;  [facility];  Service: Cardiovascular;  Laterality: N/A;   TEE WITHOUT CARDIOVERSION N/A 03/21/2018   Procedure: TRANSESOPHAGEAL ECHOCARDIOGRAM (TEE);  Surgeon: OweRexene AlbertsD;  [facility];  Laterality: N/A;   TONSILLECTOMY     TOTAL ABDOMINAL HYSTERECTOMY  01/09/1980   XI ROBOTIC ASSISTED PARAESOPHAGEAL HERNIA REPAIR N/A 11/14/2021   Procedure: XI ROBOTIC ASSISTED PARAESOPHAGEAL HERNIA REPAIR, RNFA to assist;  Surgeon: PabJules HusbandsD;  [facility];  Service: General;  Laterality: N/A;    OB History   No obstetric history on file.      Home Medications    Prior to Admission medications   Medication Sig Start Date End Date Taking? Authorizing Provider  acetaminophen (TYLENOL) 325 MG tablet Take 2 tablets (650  mg total) by mouth every 6 (six) hours as needed for mild pain (or Fever >/= 101). 05/09/21   AndRicharda OsmondD  Ascorbic Acid (VITAMIN C PO) Take by mouth daily at 12 noon.    [provider]  aspirin EC 81 MG tablet Take 1 tablet (81 mg total) by mouth daily. 05/16/21   McLean-Scocuzza, TraNino GlowD  atorvastatin (LIPITOR) 20 MG tablet Take 1 tablet (20 mg total) by mouth daily at 6 PM. 10/17/21   McLean-Scocuzza, TraNino GlowD  Calcium Carbonate-Vitamin D 500-125 MG-UNIT TABS Take 1 tablet by mouth daily.    [provider]  cholecalciferol (VITAMIN D) 1000 units tablet Take 1,000 Units by mouth daily.    [provider]  ferrous sulfate (FEROSUL) 325 (65 FE) MG tablet Take 1 tablet (325 mg total) by mouth daily with breakfast. With vitamin C 500 mg daily Patient not taking: Reported on 12/11/2021 10/17/21   McLean-Scocuzza, TraNino GlowD  Flaxseed, Linseed, (FLAX SEED OIL) 1300 MG CAPS Take 1,100 mg by mouth daily.     [provider]  ipratropium-albuterol (DUONEB) 0.5-2.5 (3) MG/3ML SOLN INHALE THE CONTENTS OF ONE (1) VIAL VIA NEBULIZATION EVERY 6 HOURS AS NEEDED Patient not taking: Reported on 12/11/2021 08/15/21   Kennyth Arnold, FNP  loratadine (CLARITIN) 10 MG tablet Take 1 tablet (10 mg total) by mouth daily. As needed at night 10/17/21 10/12/22  McLean-Scocuzza, Nino Glow, MD  losartan (COZAAR) 25 MG tablet Take 0.5-1 tablets (12.5-25 mg total) by mouth 2 (two) times daily. If BP >140/>80 take losartan 25 mg bid 10/17/21   McLean-Scocuzza, Nino Glow, MD  Multiple Vitamin (MULTIVITAMIN) tablet Take 1 tablet by mouth daily.    [provider]  Omega-3 Fatty Acids (FISH OIL) 1200 MG CAPS Take by mouth daily at 8 pm. Patient not taking: Reported on 12/11/2021    [provider]    Family History Family History  Problem Relation Age of Onset   Colon cancer Brother    Cancer Brother        neck tumor cancerous    Esophageal varices Brother     Colon cancer Maternal Uncle    Colon cancer Maternal Grandmother    Kidney disease Brother    Coronary artery disease Brother        with stent, cabg   Breast cancer Neg Hx     Social History Social History   Tobacco Use   Smoking status: Never   Smokeless tobacco: Never  Vaping Use   Vaping Use: Never used  Substance Use Topics   Alcohol use: No    Alcohol/week: 0.0 standard drinks of alcohol   Drug use: No     Allergies   Bee venom, Codeine, Morphine and related, Shellfish allergy, Bactrim [sulfamethoxazole-trimethoprim], Norvasc [amlodipine], and Nitrofurantoin   Review of Systems Review of Systems   Physical Exam Triage Vital Signs ED Triage Vitals [12/12/21 1645]  Enc Vitals Group     BP (!) 149/72     Pulse Rate 69     Resp 18     Temp 97.6 F (36.4 C)     Temp Source Oral     SpO2 95 %     Weight      Height      Head Circumference      Peak Flow      Pain Score 0     Pain Loc      Pain Edu?      Excl. in [place]?    No data found.  Updated Vital Signs BP (!) 149/72 (BP Location: Left Arm)   Pulse 69   Temp 97.6 F (36.4 C) (Oral)   Resp 18   LMP  (LMP Unknown)   SpO2 95%   Visual Acuity Right Eye Distance:   Left Eye Distance:   Bilateral Distance:    Right Eye Near:   Left Eye Near:    Bilateral Near:     Physical Exam Vitals reviewed.  Constitutional:      Appearance: Normal appearance.  Cardiovascular:     Rate and Rhythm: Normal rate and regular rhythm.  Pulmonary:     Effort: Pulmonary effort is normal.     Breath sounds: Normal breath sounds.  Skin:    General: Skin is warm and dry.  Neurological:     General: No focal deficit present.     Mental Status: She is alert and oriented to person,  place, and time.  Psychiatric:        Mood and Affect: Mood normal.        Behavior: Behavior normal.      UC Treatments / Results  Labs (all labs ordered are listed, but only abnormal results are displayed) Labs Reviewed - No  data to display  EKG   Radiology CT Angio Chest W/Cm &/Or Wo Cm  Result Date: 12/11/2021 CLINICAL DATA:  Dyspnea, chronic, chest wall or pleura disease suspected DOE, worse. Chest pain and dyspnea. Hiatal hernia repair 4 weeks prior. EXAM: CT ANGIOGRAPHY CHEST WITH CONTRAST TECHNIQUE: Multidetector CT imaging of the chest was performed using the standard protocol during bolus administration of intravenous contrast. Multiplanar CT image reconstructions and MIPs were obtained to evaluate the vascular anatomy. RADIATION DOSE REDUCTION: This exam was performed according to the departmental dose-optimization program which includes automated exposure control, adjustment of the mA and/or kV according to patient size and/or use of iterative reconstruction technique. CONTRAST:  75m OMNIPAQUE IOHEXOL 350 MG/ML SOLN COMPARISON:  05/08/2021 chest CT angiogram. FINDINGS: Cardiovascular: The study is high quality for the evaluation of pulmonary embolism. There are no filling defects in the central, lobar, segmental or subsegmental pulmonary artery branches to suggest acute pulmonary embolism. Great vessels are normal in course and caliber. Mild cardiomegaly. No significant pericardial fluid/thickening. Left anterior descending coronary atherosclerosis. Left atrial appendage clip in place. Mediastinum/Nodes: No significant thyroid nodules. Moderately patulous thoracic esophagus with small fluid level. Suggestion of mild circumferential wall thickening in the lower thoracic esophagus. No pathologically enlarged axillary, mediastinal or hilar lymph nodes. Lungs/Pleura: No pneumothorax. No pleural effusion. Solid 0.5 cm posterior basilar left lower lobe pulmonary nodule (series 6/image 81), stable. Moderate patchy confluent subpleural reticulation and ground-glass opacity throughout both lungs without significant regions of traction bronchiectasis or architectural distortion, similar. Similar mosaic attenuation throughout  both lungs. No new significant pulmonary nodules. Upper abdomen: No acute abnormality. Musculoskeletal: No aggressive appearing focal osseous lesions. Intact sternotomy wires. Moderate thoracic spondylosis. Review of the MIP images confirms the above findings. IMPRESSION: 1. No pulmonary embolism. 2. Chronic moderate patchy confluent subpleural reticulation and ground-glass opacity throughout both lungs without significant traction bronchiectasis or architectural distortion, similar. Similar mosaic attenuation throughout both lungs. Findings could be due to an interstitial lung disease such is chronic hypersensitivity pneumonitis or NSIP. Outpatient high-resolution chest CT may be obtained for further evaluation as clinically warranted. 3. Moderately patulous thoracic esophagus with small fluid level, compatible with chronic esophageal dysmotility. Suggestion of mild circumferential wall thickening in the lower thoracic esophagus, nonspecific. 4. Mild cardiomegaly. One vessel coronary atherosclerosis. 5. Stable solid 0.5 cm posterior basilar left lower lobe pulmonary nodule, probably benign. Electronically Signed   By: JIlona SorrelM.D.   On: 12/11/2021 12:48    Procedures Procedures (including critical care time)  Medications Ordered in UC Medications - No data to display  Initial Impression / Assessment and Plan / UC Course  I have reviewed the triage vital signs and the nursing notes.  Pertinent labs & imaging results that were available during my care of the patient were reviewed by me and considered in my medical decision making (see chart for details).   Patient is afebrile here without recent antipyretics. Satting reasonably well (95%) on room air. Overall is well appearing, well hydrated, without respiratory distress. Pulmonary exam is unremarkable.   Reviewed the patient's recent laboratory testing.  Most were WNL.  Only outlier was low iron and transferrin however patient's RBCs and  hemoglobin appeared to be WNL.  Her CT angiogram was significant only for chronic issues.  There were no acute cardiopulmonary issues identified.  Family asked me to send a message to the patient's new PCP asking if she could possibly have an iron infusion.  I sent this message.  Patient was discharged with the suggestion that she try to increase her calorie intake.  Final Clinical Impressions(s) / UC Diagnoses   Final diagnoses:  None   Discharge Instructions   None    ED Prescriptions   None    PDMP not reviewed this encounter.   Rose Phi, [place] 12/12/21 1732

## 2021-12-12 NOTE — Telephone Encounter (Signed)
Patient saw her lab results and stated she has no energy and she saw her iron was low, please advise. Sherron Mapp,cma

## 2021-12-12 NOTE — Discharge Instructions (Addendum)
Please follow-up with your PCP

## 2021-12-12 NOTE — Telephone Encounter (Signed)
Patient saw her labs , she is requesting a call back . She stated her iron is really low and she can barley navigate.

## 2021-12-13 ENCOUNTER — Other Ambulatory Visit: Payer: Self-pay | Admitting: Family

## 2021-12-13 DIAGNOSIS — R899 Unspecified abnormal finding in specimens from other organs, systems and tissues: Secondary | ICD-10-CM

## 2021-12-13 DIAGNOSIS — J8410 Pulmonary fibrosis, unspecified: Secondary | ICD-10-CM

## 2021-12-13 NOTE — Telephone Encounter (Signed)
Spoke to patient and apologized about yesterday and was able to go over her results of labs and her CT in detail and was able to schedule her a lab appt for 2 weeks and a f/up appt for 12/15/21

## 2021-12-13 NOTE — Telephone Encounter (Signed)
I responded over mychart  See result note for CT angio and labs  Pt needs f/u appt with me asap

## 2021-12-15 ENCOUNTER — Ambulatory Visit (INDEPENDENT_AMBULATORY_CARE_PROVIDER_SITE_OTHER): Payer: Medicare Other | Admitting: Family

## 2021-12-15 ENCOUNTER — Encounter: Payer: Self-pay | Admitting: Family

## 2021-12-15 VITALS — BP 136/74 | HR 81 | Temp 98.2°F | Ht 61.0 in | Wt 112.6 lb

## 2021-12-15 DIAGNOSIS — E611 Iron deficiency: Secondary | ICD-10-CM

## 2021-12-15 DIAGNOSIS — R911 Solitary pulmonary nodule: Secondary | ICD-10-CM | POA: Diagnosis not present

## 2021-12-15 DIAGNOSIS — R5383 Other fatigue: Secondary | ICD-10-CM | POA: Diagnosis not present

## 2021-12-15 DIAGNOSIS — R0609 Other forms of dyspnea: Secondary | ICD-10-CM

## 2021-12-15 DIAGNOSIS — J8410 Pulmonary fibrosis, unspecified: Secondary | ICD-10-CM | POA: Diagnosis not present

## 2021-12-15 NOTE — Patient Instructions (Signed)
Please call Pulmonology

## 2021-12-15 NOTE — Progress Notes (Signed)
Subjective:    Patient ID: Wanda Nelson, female    DOB: Aug 16, 1931, 88 y.o.   MRN: [id number]  CC: Wanda Nelson is a 88 y.o. female who presents today for follow up.   HPI: 3 days ago she had felt very tired. 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 She was concerned and didn't have lab results from our office so she went to [facility] 12/12/21.    She feels energy has improved since resuming iron. Denies CP, SOB.   She has been clear liquid and then soft diet. She is now eating normal diet. She feels diet may have also contributed to fatigue.   She has lost 7 lbs in 4 weeks.        She has restarted ferrous sulfate one tablet BID.  Initial encounter with patient 4 days ago for fatigue, dyspnea on exertion.  Ordered CT angio dyspnea on exertion ,  as she was postoperative CT angio negative pulmonary embolism.CT was significant for a stable left lower lung nodule 0.5 cm, similar mosaic attenuation and raises concern for interstitial lung disease .  Significant for coronary artery disease and thickening of the esophagus.  CT chest 05/08/21 showed chronic changes pulmonary fibrosis, negative pulmonary malaise, very large hiatal hernia,   Patient follows with Dr Lanney Gins and last seen June 28, 2021  05/08/21 Echocardiogram left ventricular ejection fraction 60 to 65%.  Left atrial has no regional wall motion abnormalities.  Mild left ventricular hypertrophy.  Mild to moderate TVR.  No aortic valve stenosis.   HISTORY:  Past Medical History:  Diagnosis Date   Actinic keratosis    Anemia    Aortic atherosclerosis ([place])    Atrial myxoma 03/17/2018   a.) TTE 03/17/2018 --> 2.4 x 2.5 cm LEFT atrial mass attached at fossa ovalis/atrial septum; pedunculated in shape and solid in texture; b.) s/p atrial myoxma resection 03/21/2018; initially attempted via minimally invasive RIGHT thoracotomy  approach, however due to bleeding complications, procedure required median sternotomy and patient going on cardiopulmonary bypass   Basal cell carcinoma 05/17/2021   L upper back 5.0 cm lat to spine - ED&C   Diastolic dysfunction    a.) TTE 03/17/2018: EF 55-60%, LVH, mild PR, G1DD; b.) TTE 07/25/2018: EF 60-65%, RVSP 44.9, mild-mod MR/TR, G1DD; c.) TTE 12/18/2018: EF 60-65%; d.) TTE 06/12/2019: EF 50-55, mild BAE, triv TR, PASP 37.5, G2DD; e.) TTE 07/22/2020: EF 60-65%, mild MR, mod TR, RVSP 42.7, G1DD; f.) TTE 05/08/2021: EF 60-65%, mild LVH, mild-mod TR, AoV sclerosis without stenosis   Dyspnea    GERD (gastroesophageal reflux disease)    Glaucoma    Hemorrhagic cerebrovascular accident (CVA) ([place]) 1990   Hiatal hernia    History of 2019 novel coronavirus disease (COVID-19)    a.) 05/30/20 - Tx'd with "covid 19 pill"; b.) 10/2021   History of blood transfusion    History of left atrial appendage closure 03/21/2018   a.) #45 Atricure   Hyperlipidemia    Hypertension    Incidental pulmonary nodule, > 73m and < 821m03/12/2018   Noted on CT scan - needs f/u imaging 6 months   [place]ustained [place]) [place]   a.) holter 05/13/2018 --> 5 short runs   PAF (paroxysmal atrial fibrillation) ([place]/05[place]   a.) holter 05/13/2018 --> intermittent episodes with longest lasting approx 1.5 hours (burden <1%).   PSVT (paroxysmal supraventricular tachycardia) 05/13/2018  a.) holter 05/13/2018 --> frequent runs of SVT noted during study   Pulmonary fibrosis ([place])    Squamous cell carcinoma of skin 11/12/2019   L distal dorsum forearm - ED&C    Past Surgical History:  Procedure Laterality Date   BUNIONECTOMY Bilateral    CLIPPING OF ATRIAL APPENDAGE  03/21/2018   Procedure: Clipping Of Atrial Appendage - using an AtriCure 66m clip;  Surgeon: ORexene Alberts MD;  [place]  Service: Open Heart Surgery;;   ESOPHAGOGASTRODUODENOSCOPY (EGD) WITH PROPOFOL N/A  09/06/2021   Procedure: ESOPHAGOGASTRODUODENOSCOPY (EGD) WITH PROPOFOL;  Surgeon: AJonathon Bellows MD;  [facility];  Service: Gastroenterology;  Laterality: N/A;   EXCISION OF ATRIAL MYXOMA N/A 03/21/2018   Procedure: Excision Of Atrial Myxoma;  Surgeon: ORexene Alberts MD;  [place]  Service: Open Heart Surgery;  Laterality: N/A;   RECTOCELE REPAIR     RIGHT/LEFT HEART CATH AND CORONARY ANGIOGRAPHY N/A 03/19/2018   Procedure: RIGHT/LEFT HEART CATH AND CORONARY ANGIOGRAPHY;  Surgeon: AWellington Hampshire MD;  [facility];  Service: Cardiovascular;  Laterality: N/A;   TEE WITHOUT CARDIOVERSION N/A 03/21/2018   Procedure: TRANSESOPHAGEAL ECHOCARDIOGRAM (TEE);  Surgeon: ORexene Alberts MD;  [place] [facility]  Service: Open Heart Surgery;  Laterality: N/A;   TONSILLECTOMY     TOTAL ABDOMINAL HYSTERECTOMY  01/09/1980   XI ROBOTIC ASSISTED PARAESOPHAGEAL HERNIA REPAIR N/A 11/14/2021   Procedure: XI ROBOTIC ASSISTED PARAESOPHAGEAL HERNIA REPAIR, RNFA to assist;  Surgeon: PJules Husbands MD;  Location: [facility];  Service: General;  Laterality: N/A;   Family History  Problem Relation Age of Onset   Colon cancer Brother    Cancer Brother        neck tumor cancerous    Esophageal varices Brother    Colon cancer Maternal Uncle    Colon cancer Maternal Grandmother    Kidney disease Brother    Coronary artery disease Brother        with stent, cabg   Breast cancer Neg Hx     Allergies: Bee venom, Codeine, Morphine and related, Shellfish allergy, Bactrim [sulfamethoxazole-trimethoprim], Norvasc [amlodipine], and Nitrofurantoin Current Outpatient Medications on File Prior to Visit  Medication Sig Dispense Refill   acetaminophen (TYLENOL) 325 MG tablet Take 2 tablets (650 mg total) by mouth every 6 (six) hours as needed for mild pain (or Fever >/= 101).     Ascorbic Acid (VITAMIN C PO) Take by mouth daily at 12 noon.     aspirin EC 81 MG tablet Take 1 tablet (81 mg  total) by mouth daily. 90 tablet 3   atorvastatin (LIPITOR) 20 MG tablet Take 1 tablet (20 mg total) by mouth daily at 6 PM. 90 tablet 3   Calcium Carbonate-Vitamin D 500-125 MG-UNIT TABS Take 1 tablet by mouth daily.     cholecalciferol (VITAMIN D) 1000 units tablet Take 1,000 Units by mouth daily.     ferrous sulfate (FEROSUL) 325 (65 FE) MG tablet Take 1 tablet (325 mg total) by mouth daily with breakfast. With vitamin C 500 mg daily 90 tablet 3   Flaxseed, Linseed, (FLAX SEED OIL) 1300 MG CAPS Take 1,100 mg by mouth daily.      loratadine (CLARITIN) 10 MG tablet Take 1 tablet (10 mg total) by mouth daily. As needed at night 90 tablet 3   losartan (COZAAR) 25 MG tablet Take 0.5-1 tablets (12.5-25 mg total) by mouth 2 (two) times daily. If BP >140/>80 take losartan 25  mg bid 180 tablet 3   Multiple Vitamin (MULTIVITAMIN) tablet Take 1 tablet by mouth daily.     Omega-3 Fatty Acids (FISH OIL) 1200 MG CAPS Take by mouth daily at 8 pm.     ipratropium-albuterol (DUONEB) 0.5-2.5 (3) MG/3ML SOLN INHALE THE CONTENTS OF ONE (1) VIAL VIA NEBULIZATION EVERY 6 HOURS AS NEEDED (Patient not taking: Reported on 12/15/2021) 360 mL 0   No current facility-administered medications on file prior to visit.    Social History   Tobacco Use   Smoking status: Never   Smokeless tobacco: Never  Vaping Use   Vaping Use: Never used  Substance Use Topics   Alcohol use: No    Alcohol/week: 0.0 standard drinks of alcohol   Drug use: No    Review of Systems  Constitutional:  Positive for fatigue. Negative for chills and fever.  Respiratory:  Negative for cough and shortness of breath.   Cardiovascular:  Negative for chest pain and palpitations.  Gastrointestinal:  Negative for nausea and vomiting.      Objective:    BP 136/74   Pulse 81   Temp 98.2 F (36.8 C) (Oral)   Ht '5\' 1"'$  (1.549 m)   Wt 112 lb 9.6 oz (51.1 kg)   LMP  (LMP Unknown)   SpO2 95%   BMI 21.28 kg/m  BP Readings from Last 3  Encounters:  12/15/21 136/74  12/12/21 (!) 149/72  12/11/21 118/78   Wt Readings from Last 3 Encounters:  12/15/21 112 lb 9.6 oz (51.1 kg)  12/11/21 111 lb 12.8 oz (50.7 kg)  11/29/21 114 lb 9.6 oz (52 kg)    Physical Exam Vitals reviewed.  Constitutional:      Appearance: She is well-developed.  Eyes:     Conjunctiva/sclera: Conjunctivae normal.  Cardiovascular:     Rate and Rhythm: Normal rate and regular rhythm.     Pulses: Normal pulses.     Heart sounds: Normal heart sounds.  Pulmonary:     Effort: Pulmonary effort is normal.     Breath sounds: Normal breath sounds. No wheezing, rhonchi or rales.  Skin:    General: Skin is warm and dry.  Neurological:     Mental Status: She is alert.  Psychiatric:        Speech: Speech normal.        Behavior: Behavior normal.        Thought Content: Thought content normal.        Assessment & Plan:   Problem List Items Addressed This Visit       Respiratory   Lung nodule    Reviewed pulmonology's previous consult.  I have sent a message to Dr. Lanney Gins in regards to new pulmonary nodule left lower lung nodule 0.5 cm. Nodule was not seen in May of this year with previous CT chest.  I asked his advice in regards for surveillance.  She has follow-up with Dr. Lanney Gins 01/17/2022.      Pulmonary fibrosis (HCC)    Chronic, stable.  She is follow up with Dr Lanney Gins next month.         Other   DOE (dyspnea on exertion) - Primary   Relevant Orders   IBC + Ferritin   Fatigue    Discussed with patient suspect fatigue may be multifactorial including deconditioning after recent mitral hernia repair, changes to appetite.  Fortunately she is eating regular foods no longer soft pured foods.  She resumed iron ferrous sulfate 325 mg BID on  her own.  Iron stores are adequate although serum iron is on the low end of normal 29, previously 129.  She is not anemic.  Counseled patient that I think is reasonable for her to resume iron  especially as fatigue has improved.  She would need to have iron stores checked as ultimately she may not require iron for a long.  She has follow-up with Dr. Volanda Napoleon to establish care 01/04/22.       Other Visit Diagnoses     Low serum iron       Relevant Orders   IBC + Ferritin        I am having Bellamarie P. Stoneking maintain her multivitamin, Flax Seed Oil, Calcium Carbonate-Vitamin D, cholecalciferol, Ascorbic Acid (VITAMIN C PO), Fish Oil, acetaminophen, aspirin EC, ipratropium-albuterol, atorvastatin, ferrous sulfate, loratadine, and losartan.   No orders of the defined types were placed in this encounter.   Return precautions given.   Risks, benefits, and alternatives of the medications and treatment plan prescribed today were discussed, and patient expressed understanding.   Education regarding symptom management and diagnosis given to patient on AVS.  Continue to follow with Carollee Leitz, MD for routine health maintenance.   Wanda Nelson and I agreed with plan.   Mable Paris, FNP

## 2021-12-19 DIAGNOSIS — R5383 Other fatigue: Secondary | ICD-10-CM | POA: Insufficient documentation

## 2021-12-19 HISTORY — DX: Other fatigue: R53.83

## 2021-12-19 NOTE — Assessment & Plan Note (Signed)
Discussed with patient suspect fatigue may be multifactorial including deconditioning after recent mitral hernia repair, changes to appetite.  Fortunately she is eating regular foods no longer soft pured foods.  She resumed iron ferrous sulfate 325 mg BID on her own.  Iron stores are adequate although serum iron is on the low end of normal 29, previously 129.  She is not anemic.  Counseled patient that I think is reasonable for her to resume iron especially as fatigue has improved.  She would need to have iron stores checked as ultimately she may not require iron for a long.  She has follow-up with Dr. Volanda Napoleon to establish care 01/04/22.

## 2021-12-19 NOTE — Assessment & Plan Note (Signed)
Chronic, stable.  She is follow up with Dr Lanney Gins next month.

## 2021-12-19 NOTE — Assessment & Plan Note (Signed)
Reviewed pulmonology's previous consult.  I have sent a message to Dr. Lanney Gins in regards to new pulmonary nodule left lower lung nodule 0.5 cm. Nodule was not seen in May of this year with previous CT chest.  I asked his advice in regards for surveillance.  She has follow-up with Dr. Lanney Gins 01/17/2022.

## 2021-12-19 NOTE — Assessment & Plan Note (Deleted)
Reviewed pulmonology's previous consult.  I have sent a message to Dr. Lanney Gins in regards to new pulmonary nodule left lower lung nodule 0.5 cm. Nodule was not seen in May of this year with previous CT chest.  I asked his advice in regards for surveillance.  She has follow-up with Dr. Lanney Gins 01/17/2022.

## 2021-12-20 ENCOUNTER — Telehealth: Payer: Self-pay

## 2021-12-20 NOTE — Telephone Encounter (Signed)
-----   Message from Jonathon Bellows, MD sent at 12/15/2021 10:30 AM EST ----- Herb Grays would need office visit please   Kiran ----- Message ----- From: Burnard Hawthorne, FNP Sent: 12/13/2021   1:23 PM EST To: Jonathon Bellows, MD; [facility]  Dr Vicente Males,  [facility] you are well.  Patient is having trouble swallowing and on CT is showing thickened esophagus. I have asked her to call your office to schedule an appointment. She just had recent endoscopy 08/2021. She had hiatal hernia surgery 11/17.   [facility] team CALL- Ensure patient has seen result note regarding CT angio

## 2021-12-20 NOTE — Telephone Encounter (Signed)
Called patient but had to leave her a voicemail letting her know that Dr. Vicente Males would want for her to be seen at the office. I offered her an appointment for tomorrow 12/21/2021 at 2:00 PM so t please call me to confirm if she would like to come in so I could add her to the schedule.

## 2021-12-25 ENCOUNTER — Other Ambulatory Visit (INDEPENDENT_AMBULATORY_CARE_PROVIDER_SITE_OTHER): Payer: Medicare Other

## 2021-12-25 DIAGNOSIS — E611 Iron deficiency: Secondary | ICD-10-CM

## 2021-12-25 DIAGNOSIS — R899 Unspecified abnormal finding in specimens from other organs, systems and tissues: Secondary | ICD-10-CM | POA: Diagnosis not present

## 2021-12-25 DIAGNOSIS — R0609 Other forms of dyspnea: Secondary | ICD-10-CM

## 2021-12-25 LAB — CBC WITH DIFFERENTIAL/PLATELET
Basophils Absolute: 0.1 10*3/uL (ref 0.0–0.1)
Basophils Relative: 0.9 % (ref 0.0–3.0)
Eosinophils Absolute: 1.4 10*3/uL — ABNORMAL HIGH (ref 0.0–0.7)
Eosinophils Relative: 14.8 % — ABNORMAL HIGH (ref 0.0–5.0)
HCT: 38.6 % (ref 36.0–46.0)
Hemoglobin: 12.7 g/dL (ref 12.0–15.0)
Lymphocytes Relative: 17.2 % (ref 12.0–46.0)
Lymphs Abs: 1.6 10*3/uL (ref 0.7–4.0)
MCHC: 33 g/dL (ref 30.0–36.0)
MCV: 94.9 fl (ref 78.0–100.0)
Monocytes Absolute: 0.7 10*3/uL (ref 0.1–1.0)
Monocytes Relative: 7.9 % (ref 3.0–12.0)
Neutro Abs: 5.5 10*3/uL (ref 1.4–7.7)
Neutrophils Relative %: 59.2 % (ref 43.0–77.0)
Platelets: 250 10*3/uL (ref 150.0–400.0)
RBC: 4.07 Mil/uL (ref 3.87–5.11)
RDW: 14.2 % (ref 11.5–15.5)
WBC: 9.3 10*3/uL (ref 4.0–10.5)

## 2021-12-26 ENCOUNTER — Ambulatory Visit (INDEPENDENT_AMBULATORY_CARE_PROVIDER_SITE_OTHER): Payer: Medicare Other | Admitting: Gastroenterology

## 2021-12-26 ENCOUNTER — Encounter: Payer: Self-pay | Admitting: Gastroenterology

## 2021-12-26 VITALS — BP 126/76 | HR 74 | Temp 97.8°F | Wt 113.2 lb

## 2021-12-26 DIAGNOSIS — Z87898 Personal history of other specified conditions: Secondary | ICD-10-CM | POA: Diagnosis not present

## 2021-12-26 LAB — IBC + FERRITIN
Ferritin: 367.6 ng/mL — ABNORMAL HIGH (ref 10.0–291.0)
Iron: 53 ug/dL (ref 42–145)
Saturation Ratios: 22.5 % (ref 20.0–50.0)
TIBC: 235.2 ug/dL — ABNORMAL LOW (ref 250.0–450.0)
Transferrin: 168 mg/dL — ABNORMAL LOW (ref 212.0–360.0)

## 2021-12-26 NOTE — Progress Notes (Signed)
Jonathon Bellows MD, MRCP(U.K) [address]  [place]  [place], [place] [address]  Main: [phone]  Fax: [phone]   Primary Care Physician: Carollee Leitz, MD  Primary Gastroenterologist:  Dr. Jonathon Bellows   Chief Complaint  Patient presents with   Dysphagia    HPI: Wanda Nelson is a 88 y.o. female    Summary of history :  Initially she was seen to undergo a preop endoscopy prior to repair of paraesophageal hernia   Interval history 08/08/2021-12/26/2021   09/06/2021: EGD: Large hiatal hernia. The patient had postoperative check in #2022 after repair of paraesophageal hernia doing well 12/11/2021 had a CT angiogram PE protocol that showed features of bronchiectasis versus interstitial lung disease patulous thoracic esophagus with small fluid level suggestive of chronic esophageal dysmotility.  Seen by her primary care provider on 12/13/2021 and she was having some difficulty swallowing.  Since the surgery has difficulty swallowing large pills if she breaks it into half goes down easily.  She is only 6 weeks out of surgery otherwise her food goes down without a problem.  Current Outpatient Medications  Medication Sig Dispense Refill   acetaminophen (TYLENOL) 325 MG tablet Take 2 tablets (650 mg total) by mouth every 6 (six) hours as needed for mild pain (or Fever >/= 101).     Ascorbic Acid (VITAMIN C PO) Take by mouth daily at 12 noon.     aspirin EC 81 MG tablet Take 1 tablet (81 mg total) by mouth daily. 90 tablet 3   atorvastatin (LIPITOR) 20 MG tablet Take 1 tablet (20 mg total) by mouth daily at 6 PM. 90 tablet 3   Calcium Carbonate-Vitamin D 500-125 MG-UNIT TABS Take 1 tablet by mouth daily.     cholecalciferol (VITAMIN D) 1000 units tablet Take 1,000 Units by mouth daily.     ferrous sulfate (FEROSUL) 325 (65 FE) MG tablet Take 1 tablet (325 mg total) by mouth daily with breakfast. With vitamin C 500 mg daily 90 tablet 3   Flaxseed, Linseed, (FLAX SEED  OIL) 1300 MG CAPS Take 1,100 mg by mouth daily.      ipratropium-albuterol (DUONEB) 0.5-2.5 (3) MG/3ML SOLN INHALE THE CONTENTS OF ONE (1) VIAL VIA NEBULIZATION EVERY 6 HOURS AS NEEDED 360 mL 0   loratadine (CLARITIN) 10 MG tablet Take 1 tablet (10 mg total) by mouth daily. As needed at night 90 tablet 3   losartan (COZAAR) 25 MG tablet Take 0.5-1 tablets (12.5-25 mg total) by mouth 2 (two) times daily. If BP >140/>80 take losartan 25 mg bid 180 tablet 3   Multiple Vitamin (MULTIVITAMIN) tablet Take 1 tablet by mouth daily.     Omega-3 Fatty Acids (FISH OIL) 1200 MG CAPS Take by mouth daily at 8 pm.     No current facility-administered medications for this visit.    Allergies as of 12/26/2021 - Review Complete 12/26/2021  Allergen Reaction Noted   Bee venom Anaphylaxis and Hives 03/15/2015   Codeine Other (See Comments) 03/15/2015   Morphine and related  03/15/2015   Shellfish allergy Anaphylaxis and Hives 03/15/2015   Bactrim [sulfamethoxazole-trimethoprim]  06/24/2019   Norvasc [amlodipine]  07/20/2020   Nitrofurantoin Rash 03/15/2015    ROS:  General: Negative for anorexia, weight loss, fever, chills, fatigue, weakness. ENT: Negative for hoarseness, difficulty swallowing , nasal congestion. CV: Negative for chest pain, angina, palpitations, dyspnea on exertion, peripheral edema.  Respiratory: Negative for dyspnea at rest, dyspnea on exertion, cough, sputum, wheezing.  GI:  See history of present illness. GU:  Negative for dysuria, hematuria, urinary incontinence, urinary frequency, nocturnal urination.  Endo: Negative for unusual weight change.    Physical Examination:   BP 126/76   Pulse 74   Temp 97.8 F (36.6 C) (Oral)   Wt 113 lb 3.2 oz (51.3 kg)   LMP  (LMP Unknown)   BMI 21.39 kg/m   General: Well-nourished, well-developed in no acute distress.  Eyes: No icterus. Conjunctivae pink. Neuro: Alert and oriented x 3.  Grossly intact. Skin: Warm and dry, no jaundice.    Psych: Alert and cooperative, normal mood and affect.   Imaging Studies: CT Angio Chest W/Cm &/Or Wo Cm  Result Date: 12/11/2021 CLINICAL DATA:  Dyspnea, chronic, chest wall or pleura disease suspected DOE, worse. Chest pain and dyspnea. Hiatal hernia repair 4 weeks prior. EXAM: CT ANGIOGRAPHY CHEST WITH CONTRAST TECHNIQUE: Multidetector CT imaging of the chest was performed using the standard protocol during bolus administration of intravenous contrast. Multiplanar CT image reconstructions and MIPs were obtained to evaluate the vascular anatomy. RADIATION DOSE REDUCTION: This exam was performed according to the departmental dose-optimization program which includes automated exposure control, adjustment of the mA and/or kV according to patient size and/or use of iterative reconstruction technique. CONTRAST:  48m OMNIPAQUE IOHEXOL 350 MG/ML SOLN COMPARISON:  05/08/2021 chest CT angiogram. FINDINGS: Cardiovascular: The study is high quality for the evaluation of pulmonary embolism. There are no filling defects in the central, lobar, segmental or subsegmental pulmonary artery branches to suggest acute pulmonary embolism. Great vessels are normal in course and caliber. Mild cardiomegaly. No significant pericardial fluid/thickening. Left anterior descending coronary atherosclerosis. Left atrial appendage clip in place. Mediastinum/Nodes: No significant thyroid nodules. Moderately patulous thoracic esophagus with small fluid level. Suggestion of mild circumferential wall thickening in the lower thoracic esophagus. No pathologically enlarged axillary, mediastinal or hilar lymph nodes. Lungs/Pleura: No pneumothorax. No pleural effusion. Solid 0.5 cm posterior basilar left lower lobe pulmonary nodule (series 6/image 81), stable. Moderate patchy confluent subpleural reticulation and ground-glass opacity throughout both lungs without significant regions of traction bronchiectasis or architectural distortion, similar.  Similar mosaic attenuation throughout both lungs. No new significant pulmonary nodules. Upper abdomen: No acute abnormality. Musculoskeletal: No aggressive appearing focal osseous lesions. Intact sternotomy wires. Moderate thoracic spondylosis. Review of the MIP images confirms the above findings. IMPRESSION: 1. No pulmonary embolism. 2. Chronic moderate patchy confluent subpleural reticulation and ground-glass opacity throughout both lungs without significant traction bronchiectasis or architectural distortion, similar. Similar mosaic attenuation throughout both lungs. Findings could be due to an interstitial lung disease such is chronic hypersensitivity pneumonitis or NSIP. Outpatient high-resolution chest CT may be obtained for further evaluation as clinically warranted. 3. Moderately patulous thoracic esophagus with small fluid level, compatible with chronic esophageal dysmotility. Suggestion of mild circumferential wall thickening in the lower thoracic esophagus, nonspecific. 4. Mild cardiomegaly. One vessel coronary atherosclerosis. 5. Stable solid 0.5 cm posterior basilar left lower lobe pulmonary nodule, probably benign. Electronically Signed   By: JIlona SorrelM.D.   On: 12/11/2021 12:48    Assessment and Plan:   Wanda Lentzis a 88y.o. y/o female  of a large paraesophageal hernia.  Status postrepair of paraesophageal hernia having some difficulty swallowing subsequently.  It could be that the difficulty in swallowing is related to the inflammation around the surgical site and edema I am hoping that it resolves in about 2 to 3 months.  If it does not I have asked her to call  my office and we will perform an endoscopy and empirical dilation of the GE junction to 15 mm to assist with the swallowing.  She is able to keep her food down otherwise.  Advised her to eat sitting upright she may also have a degree of esophageal dysmotility.    Dr Jonathon Bellows  MD,MRCP [facility]) Follow up in as needed

## 2021-12-27 ENCOUNTER — Other Ambulatory Visit: Payer: Self-pay | Admitting: Family

## 2021-12-27 ENCOUNTER — Telehealth: Payer: Self-pay | Admitting: Family

## 2021-12-27 DIAGNOSIS — D509 Iron deficiency anemia, unspecified: Secondary | ICD-10-CM

## 2021-12-27 DIAGNOSIS — R899 Unspecified abnormal finding in specimens from other organs, systems and tissues: Secondary | ICD-10-CM

## 2021-12-27 NOTE — Telephone Encounter (Signed)
Call [facility] pulmonology Dr Lanney Gins  Please speak to his nurse  I had sent him an Epic staff message regarding pulmonary nodules seen CT angio 12/11/21 .  Can she ask him if he can see message?   If not, please advise that CT angio obtained in the setting of dyspnea after surgery 11/14/2021 for hiatal hernia.  Negative pulmonary embolism. New solid 0.5 cm left lower lobe pulmonary nodule.  Dr Lanney Gins sees patient 01/17/22. I wanted advice regarding surveillance needed for pulmonary nodule.  Nodule was not mentioned CT angio 05/08/2021  She has no smoking history  Please print this telephone note and fax with report of CT angio 12/11/21 to his attention

## 2021-12-29 NOTE — Telephone Encounter (Signed)
noted 

## 2021-12-29 NOTE — Telephone Encounter (Signed)
Left message to call the office back regarding Wanda Nelson's message

## 2021-12-29 NOTE — Telephone Encounter (Signed)
Printed the phone note from you and the CT angio result and faxed to Dr. Teodoro Kil office and wrote Att: Dr. Lanney Gins also highlighted the Attn: part.

## 2022-01-04 ENCOUNTER — Encounter: Payer: Self-pay | Admitting: Family Medicine

## 2022-01-04 ENCOUNTER — Ambulatory Visit (INDEPENDENT_AMBULATORY_CARE_PROVIDER_SITE_OTHER): Payer: Medicare Other | Admitting: Family Medicine

## 2022-01-04 VITALS — BP 124/76 | HR 57 | Temp 97.6°F | Ht 61.0 in | Wt 113.0 lb

## 2022-01-04 DIAGNOSIS — Z8719 Personal history of other diseases of the digestive system: Secondary | ICD-10-CM

## 2022-01-04 DIAGNOSIS — Z23 Encounter for immunization: Secondary | ICD-10-CM

## 2022-01-04 DIAGNOSIS — Z7689 Persons encountering health services in other specified circumstances: Secondary | ICD-10-CM

## 2022-01-04 DIAGNOSIS — R0609 Other forms of dyspnea: Secondary | ICD-10-CM | POA: Diagnosis not present

## 2022-01-04 DIAGNOSIS — J8410 Pulmonary fibrosis, unspecified: Secondary | ICD-10-CM | POA: Diagnosis not present

## 2022-01-04 DIAGNOSIS — Z9889 Other specified postprocedural states: Secondary | ICD-10-CM | POA: Diagnosis not present

## 2022-01-04 DIAGNOSIS — D509 Iron deficiency anemia, unspecified: Secondary | ICD-10-CM | POA: Diagnosis not present

## 2022-01-04 DIAGNOSIS — E7800 Pure hypercholesterolemia, unspecified: Secondary | ICD-10-CM

## 2022-01-04 DIAGNOSIS — I10 Essential (primary) hypertension: Secondary | ICD-10-CM

## 2022-01-04 DIAGNOSIS — I700 Atherosclerosis of aorta: Secondary | ICD-10-CM | POA: Diagnosis not present

## 2022-01-04 NOTE — Progress Notes (Signed)
SUBJECTIVE:   Chief Complaint  Patient presents with   Establish Care    Transfer of Care   HPI Patient presents to clinic to transfer care.  No acute concerns today.  Patient reports she had surgery on 11/14/2021 and previous PCP wanted patient to follow-up today.  4 to 6 weeks after surgery.  She is doing well status post repair of umbilical hernia.  She had an episode of fatigue and was seen in the ED approximately 1 month after her surgery for fatigue and shortness of breath.  At that time nutrition had not been back to baseline and she had been back to walking 3 miles a day.  Reports prior to her surgery she was able to do this with ease.  She was seen seen in clinic for DOE, she had resumed regular diet as well as restarted her ferrous sulfate twice daily.  Iron studies completed at that time showed low trans ferritin, elevated ferritin and low TIBC.  Iron normal.  Today she indicates that she is feeling much improved.  She thinks she may have overexerted herself initially with the increase in walking after surgery and decrease in p.o. intake.  She is now back at baseline.  Denies any DOE, chest pain, palpitations, weakness or dizziness.  Hyperlipidemia Takes atorvastatin 20 mg daily.  Tolerating medication well.  Pulmonary fibrosis Minimal shortness of breath.  Follows with pulmonology at [facility].  Hypertension Asymptomatic.  Takes Cozaar 25 mg twice daily.  Tolerating medication well.      PERTINENT PMH / PSH: CAD Hypertension Pulmonary fibrosis Hyperlipidemia   OBJECTIVE:  BP 124/76   Pulse (!) 57   Temp 97.6 F (36.4 C)   Ht '5\' 1"'$  (1.549 m)   Wt 113 lb (51.3 kg)   LMP  (LMP Unknown)   SpO2 98%   BMI 21.35 kg/m    Physical Exam Vitals reviewed.  Constitutional:      General: She is not in acute distress.    Appearance: She is normal weight. She is not ill-appearing.  HENT:     Head: Normocephalic.     Nose: Nose normal.  Eyes:     Conjunctiva/sclera:  Conjunctivae normal.  Cardiovascular:     Rate and Rhythm: Regular rhythm. Bradycardia present.     Heart sounds: Normal heart sounds. No murmur heard. Pulmonary:     Effort: Pulmonary effort is normal.     Breath sounds: Normal breath sounds.  Abdominal:     General: Abdomen is flat.     Palpations: Abdomen is soft.  Musculoskeletal:        General: Normal range of motion.     Cervical back: Normal range of motion.  Skin:    General: Skin is warm.  Neurological:     Mental Status: She is alert and oriented to person, place, and time. Mental status is at baseline.  Psychiatric:        Mood and Affect: Mood normal.        Behavior: Behavior normal.        Thought Content: Thought content normal.        Judgment: Judgment normal.     ASSESSMENT/PLAN:  S/P repair of paraesophageal hernia Assessment & Plan: Doing well status post hernia repair 11/07.  Has had follow-up with GEN surge. Maintain Nissen diet per general surgery Follow-up with general surgeon as scheduled.    Aortic atherosclerosis (HCC) Assessment & Plan: Chronic.  Stable.  Currently on statin therapy.  Tolerating  well.  No myalgias. Continue Lipitor 20 mg daily.    Pulmonary fibrosis ([place]) Assessment & Plan: Chronic.  Stable.  Asymptomatic. Follows with pulmonology at [facility].   Essential hypertension Assessment & Plan: Chronic.  Stable.  Well-controlled on current medication and tolerating well.  Recent creatinine within normal limits. Continue Cozaar 25 mg twice daily. Creatinine at next visit   Need for pneumococcal 20-valent conjugate vaccination -     Pneumococcal conjugate vaccine 20-valent  DOE (dyspnea on exertion) Assessment & Plan: Improved.  Recent CT angio negative for PE.  Notable for interstitial lung disease.  Patient follows with pulmonology at [facility].    Iron deficiency anemia, unspecified iron deficiency anemia type Assessment & Plan: Chronic.  Stable.  Recent iron normal,  ferritin elevated.  Hemoglobin stable.  Normal MCV. Can decrease ferrous sulfate 325 every other day.      PDMP reviewed  Return in about 3 months (around 04/05/2022) for PCP, anemia.  Carollee Leitz, MD

## 2022-01-04 NOTE — Patient Instructions (Addendum)
It was a pleasure meeting you today. Thank you for allowing me to take part in your health care.  Our goals for today as we discussed include:  Your iron levels are high. You do not need to take iron supplementations at this time. I will recheck at your next visit If you start having any shortness of breath chest pain or noticed any bleeding please notify MD or call 911 or have someone take you to the emergency department for evaluation.   Continue losartan 25 mg half a tablet in the morning and half tablet at night. Continue Lipitor 20 mg daily  Follow-up with pulmonology as scheduled Follow-up with cardiology as scheduled  You have received your Pneumonia 20 vaccine today.  You will not need any more vaccines for pneumonia in the future.  Follow-up in 3 months   Follow-up for annual visit in November 2024   If you have any questions or concerns, please do not hesitate to call the office at [phone].  I look forward to our next visit and until then take care and stay safe.  Regards,   Carollee Leitz, MD   [facility]

## 2022-01-17 DIAGNOSIS — K219 Gastro-esophageal reflux disease without esophagitis: Secondary | ICD-10-CM | POA: Diagnosis not present

## 2022-01-18 ENCOUNTER — Ambulatory Visit (INDEPENDENT_AMBULATORY_CARE_PROVIDER_SITE_OTHER): Payer: Medicare Other | Admitting: Dermatology

## 2022-01-18 DIAGNOSIS — L578 Other skin changes due to chronic exposure to nonionizing radiation: Secondary | ICD-10-CM

## 2022-01-18 DIAGNOSIS — L570 Actinic keratosis: Secondary | ICD-10-CM | POA: Diagnosis not present

## 2022-01-18 NOTE — Patient Instructions (Addendum)
Cryotherapy Aftercare  Wash gently with soap and water everyday.   Apply Vaseline and Band-Aid daily until healed.    Actinic keratoses are precancerous spots that appear secondary to cumulative UV radiation exposure/sun exposure over time. They are chronic with expected duration over 1 year. A portion of actinic keratoses will progress to squamous cell carcinoma of the skin. It is not possible to reliably predict which spots will progress to skin cancer and so treatment is recommended to prevent development of skin cancer.  Recommend daily broad spectrum sunscreen SPF 30+ to sun-exposed areas, reapply every 2 hours as needed.  Recommend staying in the shade or wearing long sleeves, sun glasses (UVA+UVB protection) and wide brim hats (4-inch brim around the entire circumference of the hat). Call for new or changing lesions.        Due to recent changes in healthcare laws, you may see results of your pathology and/or laboratory studies on MyChart before the doctors have had a chance to review them. We understand that in some cases there may be results that are confusing or concerning to you. Please understand that not all results are received at the same time and often the doctors may need to interpret multiple results in order to provide you with the best plan of care or course of treatment. Therefore, we ask that you please give [place] 2 business days to thoroughly review all your results before contacting the office for clarification. Should we see a critical lab result, you will be contacted sooner.   If You Need Anything After Your Visit  If you have any questions or concerns for your doctor, please call our main line at [phone] and press option 4 to reach your doctor's medical assistant. If no one answers, please leave a voicemail as directed and we will return your call as soon as possible. Messages left after 4 pm will be answered the following business day.   You may also send  [place] a message via [place]. We typically respond to MyChart messages within 1-2 business days.  For prescription refills, please ask your pharmacy to contact our office. Our fax number is [phone].  If you have an urgent issue when the clinic is closed that cannot wait until the next business day, you can page your doctor at the number below.    Please note that while we do our best to be available for urgent issues outside of office hours, we are not available 24/7.   If you have an urgent issue and are unable to reach [place], you may choose to seek medical care at your doctor's office, retail clinic, urgent care center, or emergency room.  If you have a medical emergency, please immediately call 911 or go to the emergency department.  Pager Numbers  - Dr. Nehemiah Massed: [phone]  - Dr. Laurence Ferrari: [phone]  - Dr. Nicole Kindred: [phone]  In the event of inclement weather, please call our main line at [phone] for an update on the status of any delays or closures.  Dermatology Medication Tips: Please keep the boxes that topical medications come in in order to help keep track of the instructions about where and how to use these. Pharmacies typically print the medication instructions only on the boxes and not directly on the medication tubes.   If your medication is too expensive, please contact our office at [phone] option 4 or send [place] a message through [place].   We are unable to tell what your co-pay for medications  will be in advance as this is different depending on your insurance coverage. However, we may be able to find a substitute medication at lower cost or fill out paperwork to get insurance to cover a needed medication.   If a prior authorization is required to get your medication covered by your insurance company, please allow [place] 1-2 business days to complete this process.  Drug prices often vary depending on where the prescription is filled and some pharmacies may offer  cheaper prices.  The website www.goodrx.com contains coupons for medications through different pharmacies. The prices here do not account for what the cost may be with help from insurance (it may be cheaper with your insurance), but the website can give you the price if you did not use any insurance.  - You can print the associated coupon and take it with your prescription to the pharmacy.  - You may also stop by our office during regular business hours and pick up a GoodRx coupon card.  - If you need your prescription sent electronically to a different pharmacy, notify our office through [facility] or by phone at [phone] option 4.     Si Usted Necesita Algo Despus de Su Visita  Tambin puede enviarnos un mensaje a travs de Pharmacist, community. Por lo general respondemos a los mensajes de MyChart en el transcurso de 1 a 2 das hbiles.  Para renovar recetas, por favor pida a su farmacia que se ponga en contacto con nuestra oficina. Harland Dingwall de fax es [place] [phone].  Si tiene un asunto urgente cuando la clnica est cerrada y que no puede esperar hasta el siguiente da hbil, puede llamar/localizar a su doctor(a) al nmero que aparece a continuacin.   Por favor, tenga en cuenta que aunque hacemos todo lo posible para estar disponibles para asuntos urgentes fuera del horario de [place], no estamos disponibles las 24 horas del da, los 7 das de la [place].   Si tiene un problema urgente y no puede comunicarse con nosotros, puede optar por buscar atencin mdica  en el consultorio de su doctor(a), en una clnica privada, en un centro de atencin urgente o en una sala de emergencias.  Si tiene Engineering geologist, por favor llame inmediatamente al 911 o vaya a la sala de emergencias.  Nmeros de bper  - Dr. Nehemiah Massed: [phone]  - Dra. Moye: [phone]  - Dra. Nicole Kindred: [phone]  En caso de inclemencias del [place], por favor llame a Johnsie Kindred principal al  [phone] para una actualizacin sobre el [place] de cualquier retraso o cierre.  Consejos para la medicacin en dermatologa: Por favor, guarde las cajas en las que vienen los medicamentos de uso tpico para ayudarle a seguir las instrucciones sobre dnde y cmo usarlos. Las farmacias generalmente imprimen las instrucciones del medicamento slo en las cajas y no directamente en los tubos del [place].   Si su medicamento es muy caro, por favor, pngase en contacto con Zigmund Daniel llamando al [phone] y presione la opcin 4 o envenos un mensaje a travs de Pharmacist, community.   No podemos decirle cul ser su copago por los medicamentos por adelantado ya que esto es diferente dependiendo de la cobertura de su seguro. Sin embargo, es posible que podamos encontrar un medicamento sustituto a Electrical engineer un formulario para que el seguro cubra el medicamento que se considera necesario.   Si se requiere una autorizacin previa para que su compaa de seguros [place] su medicamento, por favor permtanos de  1 a 2 das hbiles para completar este proceso.  Los precios de los medicamentos varan con frecuencia dependiendo del Environmental consultant de dnde se surte la receta y alguna farmacias pueden ofrecer precios ms baratos.  El sitio web www.goodrx.com tiene cupones para medicamentos de Airline pilot. Los precios aqu no tienen en cuenta lo que podra costar con la ayuda del seguro (puede ser ms barato con su seguro), pero el sitio web puede darle el precio si no utiliz Research scientist (physical sciences).  - Puede imprimir el cupn correspondiente y llevarlo con su receta a la farmacia.  - Tambin puede pasar por nuestra oficina durante el horario de atencin regular y Charity fundraiser una tarjeta de cupones de GoodRx.  - Si necesita que su receta se enve electrnicamente a una farmacia diferente, informe a nuestra oficina a travs de MyChart de [place] o por telfono llamando al [phone] y presione la opcin 4.

## 2022-01-18 NOTE — Progress Notes (Signed)
   Follow-Up Visit   Subjective  Wanda Nelson is a 89 y.o. female who presents for the following: Follow-up (Patient reports rough spot at right cheek area she would like checked. Hx of isk and aks ). The patient has spots, moles and lesions to be evaluated, some may be new or changing and the patient has concerns that these could be cancer.  The following portions of the chart were reviewed this encounter and updated as appropriate:  Tobacco  Allergies  Meds  Problems  Med Hx  Surg Hx  Fam Hx     Review of Systems: No other skin or systemic complaints except as noted in HPI or Assessment and Plan.  Objective  Well appearing patient in no apparent distress; mood and affect are within normal limits.  A focused examination was performed including face and back. Relevant physical exam findings are noted in the Assessment and Plan.  right cheek infraorbital x 1, nose x 1 (2) Erythematous thin papules/macules with gritty scale.    Assessment & Plan  Actinic keratosis (2) right cheek infraorbital x 1, nose x 1  Actinic keratoses are precancerous spots that appear secondary to cumulative UV radiation exposure/sun exposure over time. They are chronic with expected duration over 1 year. A portion of actinic keratoses will progress to squamous cell carcinoma of the skin. It is not possible to reliably predict which spots will progress to skin cancer and so treatment is recommended to prevent development of skin cancer.  Recommend daily broad spectrum sunscreen SPF 30+ to sun-exposed areas, reapply every 2 hours as needed.  Recommend staying in the shade or wearing long sleeves, sun glasses (UVA+UVB protection) and wide brim hats (4-inch brim around the entire circumference of the hat). Call for new or changing lesions.  Destruction of lesion - right cheek infraorbital x 1, nose x 1 Complexity: simple   Destruction method: cryotherapy   Informed consent: discussed and consent  obtained   Timeout:  patient name, date of birth, surgical site, and procedure verified Lesion destroyed using liquid nitrogen: Yes   Region frozen until ice ball extended beyond lesion: Yes   Outcome: patient tolerated procedure well with no complications   Post-procedure details: wound care instructions given   Additional details:  Prior to procedure, discussed risks of blister formation, small wound, skin dyspigmentation, or rare scar following cryotherapy. Recommend Vaseline ointment to treated areas while healing.  Actinic Damage - chronic, secondary to cumulative UV radiation exposure/sun exposure over time - diffuse scaly erythematous macules with underlying dyspigmentation - Recommend daily broad spectrum sunscreen SPF 30+ to sun-exposed areas, reapply every 2 hours as needed.  - Recommend staying in the shade or wearing long sleeves, sun glasses (UVA+UVB protection) and wide brim hats (4-inch brim around the entire circumference of the hat). - Call for new or changing lesions.  Return in about 1 year (around 01/19/2023) for isk and ak follow up. IRuthell Rummage, CMA, am acting as scribe for Sarina Ser, MD. Documentation: I have reviewed the above documentation for accuracy and completeness, and I agree with the above.  Sarina Ser, MD

## 2022-01-20 ENCOUNTER — Encounter: Payer: Self-pay | Admitting: Family Medicine

## 2022-01-20 DIAGNOSIS — Z23 Encounter for immunization: Secondary | ICD-10-CM | POA: Insufficient documentation

## 2022-01-20 NOTE — Assessment & Plan Note (Signed)
Doing well status post hernia repair 11/07.  Has had follow-up with GEN surge. Maintain Nissen diet per general surgery Follow-up with general surgeon as scheduled.

## 2022-01-20 NOTE — Assessment & Plan Note (Signed)
Chronic.  Stable.  Asymptomatic. Follows with pulmonology at [facility].

## 2022-01-20 NOTE — Assessment & Plan Note (Signed)
Chronic.  Stable.  Currently on statin therapy.  Tolerating well.  No myalgias. Continue Lipitor 20 mg daily.

## 2022-01-20 NOTE — Assessment & Plan Note (Signed)
Chronic.  Stable.  Recent iron normal, ferritin elevated.  Hemoglobin stable.  Normal MCV. Can decrease ferrous sulfate 325 every other day.

## 2022-01-20 NOTE — Assessment & Plan Note (Addendum)
Chronic.  Stable.  Well-controlled on current medication and tolerating well.  Recent creatinine within normal limits. Continue Cozaar 25 mg twice daily. Creatinine at next visit

## 2022-01-20 NOTE — Assessment & Plan Note (Addendum)
Improved.  Recent CT angio negative for PE.  Notable for interstitial lung disease.  Patient follows with pulmonology at [facility].

## 2022-01-23 ENCOUNTER — Encounter: Payer: Self-pay | Admitting: Dermatology

## 2022-01-31 DIAGNOSIS — Z23 Encounter for immunization: Secondary | ICD-10-CM | POA: Diagnosis not present

## 2022-02-01 ENCOUNTER — Encounter: Payer: Self-pay | Admitting: Podiatry

## 2022-02-01 ENCOUNTER — Ambulatory Visit (INDEPENDENT_AMBULATORY_CARE_PROVIDER_SITE_OTHER): Payer: Medicare Other | Admitting: Podiatry

## 2022-02-01 VITALS — BP 138/73 | HR 79

## 2022-02-01 DIAGNOSIS — B351 Tinea unguium: Secondary | ICD-10-CM | POA: Diagnosis not present

## 2022-02-01 DIAGNOSIS — M79675 Pain in left toe(s): Secondary | ICD-10-CM

## 2022-02-01 DIAGNOSIS — M79674 Pain in right toe(s): Secondary | ICD-10-CM | POA: Diagnosis not present

## 2022-02-01 NOTE — Progress Notes (Signed)

## 2022-02-16 ENCOUNTER — Ambulatory Visit
Admission: EM | Admit: 2022-02-16 | Discharge: 2022-02-16 | Disposition: A | Discharge status: Home / Self Care | Payer: Medicare Other | Attending: Emergency Medicine | Admitting: Emergency Medicine

## 2022-02-16 DIAGNOSIS — H6692 Otitis media, unspecified, left ear: Secondary | ICD-10-CM | POA: Diagnosis not present

## 2022-02-16 MED ORDER — AMOXICILLIN 875 MG PO TABS: 875.0000 mg | ORAL_TABLET | Freq: Two times a day (BID) | ORAL | 0 refills | Status: DC

## 2022-02-16 NOTE — ED Triage Notes (Signed)
Patient to Urgent Care with complaints of left sided ear pain. Denies any drainage or fevers.   Symptoms started last night.

## 2022-02-16 NOTE — Discharge Instructions (Addendum)
Take the amoxicillin as directed.  Follow up with your primary care provider if your symptoms are not improving.   ° ° °

## 2022-02-16 NOTE — ED Provider Notes (Signed)
[name]    CSN: [id number] Arrival date & time: 02/16/22  [id number]      History   Chief Complaint Chief Complaint  Patient presents with   Otalgia    HPI Wanda Nelson is a 89 y.o. female.  Patient presents with left ear pain that started during the night.  No ear drainage, fever, sore throat, cough, or other symptoms.  Treatment at home with Tylenol; last dose 0600.  Her medical history includes hypertension, diastolic dysfunction, atrial fibrillation, hemorrhagic stroke, lung nodule, pulmonary fibrosis, dyspnea on exertion, glaucoma.  The history is provided by the patient and medical records.    Past Medical History:  Diagnosis Date   Actinic keratosis    Anemia    Aortic atherosclerosis ([place])    Atrial myxoma 03/17/2018   a.) TTE 03/17/2018 --> 2.4 x 2.5 cm LEFT atrial mass attached at fossa ovalis/atrial septum; pedunculated in shape and solid in texture; b.) s/p atrial myoxma resection 03/21/2018; initially attempted via minimally invasive RIGHT thoracotomy approach, however due to bleeding complications, procedure required median sternotomy and patient going on cardiopulmonary bypass   Basal cell carcinoma 05/17/2021   L upper back 5.0 cm lat to spine - ED&C   Diastolic dysfunction    a.) TTE 03/17/2018: EF 55-60%, LVH, mild PR, G1DD; b.) TTE 07/25/2018: EF 60-65%, RVSP 44.9, mild-mod MR/TR, G1DD; c.) TTE 12/18/2018: EF 60-65%; d.) TTE 06/12/2019: EF 50-55, mild BAE, triv TR, PASP 37.5, G2DD; e.) TTE 07/22/2020: EF 60-65%, mild MR, mod TR, RVSP 42.7, G1DD; f.) TTE 05/08/2021: EF 60-65%, mild LVH, mild-mod TR, AoV sclerosis without stenosis   Dyspnea    Fatigue 12/19/2021   GERD (gastroesophageal reflux disease)    Glaucoma    Hemorrhagic cerebrovascular accident (CVA) ([place]) 1990   Hiatal hernia    Hiatal hernia 05/16/2021   History of 2019 novel coronavirus disease (COVID-19)    a.) 05/30/20 - Tx'd with "covid 19 pill"; b.) 10/2021   History of blood  transfusion    History of hiatal hernia    History of left atrial appendage closure 03/21/2018   a.) #45 Atricure   Hyperlipidemia    Hypertension    Hypoxia    Incidental pulmonary nodule, > 78m and < 873m03/12/2018   Noted on CT scan - needs f/u imaging 6 months   Lung fibrosis ([facility]/09/2020   NSVT (nonsustained ventricular tachycardia) ([facility]05/05/2018   a.) holter 05/13/2018 --> 5 short runs   PAF (paroxysmal atrial fibrillation) ([facility]05/05/2018   a.) holter 05/13/2018 --> intermittent episodes with longest lasting approx 1.5 hours (burden <1%).   Pain due to onychomycosis of toenails of both feet 07/28/2020   Positive self-administered antigen test for COVID-19 10/18/2021   PSVT (paroxysmal supraventricular tachycardia) 05/13/2018   a.) holter 05/13/2018 --> frequent runs of SVT noted during study   Pulmonary fibrosis ([facility] [place]   Squamous cell carcinoma of skin 11/12/2019   L distal dorsum forearm - ED&C    Viral URI 05/31/2020    Patient Active Problem List   Diagnosis Date Noted   Need for pneumococcal 20-valent conjugate vaccination 01/20/2022   S/P repair of paraesophageal hernia 11/14/2021   Paraesophageal hernia    Allergic rhinitis 05/16/2021   Pulmonary fibrosis ([facility]/01/2021   Essential hypertension    DOE (dyspnea on exertion)    Hearing loss of left ear 11/16/2020   Aortic atherosclerosis ([facility]11/09/2020   Iron deficiency anemia 07/31/2018   Gastroesophageal reflux disease 07/31/2018  Atherosclerosis 07/31/2018   Lung nodule 03/20/2018   s/p resection left atrial myxoma 03/19/2018   HLD (hyperlipidemia) 03/15/2015   History of stroke 03/15/2015   Glaucoma 03/15/2015    Past Surgical History:  Procedure Laterality Date   BUNIONECTOMY Bilateral    CLIPPING OF ATRIAL APPENDAGE  03/21/2018   Procedure: Clipping Of Atrial Appendage - using an AtriCure 12m clip;  Surgeon: ORexene Alberts MD;  [place] [facility]  Service: Open Heart Surgery;;    ESOPHAGOGASTRODUODENOSCOPY (EGD) WITH PROPOFOL N/A 09/06/2021   Procedure: ESOPHAGOGASTRODUODENOSCOPY (EGD) WITH PROPOFOL;  Surgeon: AJonathon Bellows MD;  [facility];  Service: Gastroenterology;  Laterality: N/A;   EXCISION OF ATRIAL MYXOMA N/A 03/21/2018   Procedure: Excision Of Atrial Myxoma;  Surgeon: ORexene Alberts MD;  [place]  Service: Open Heart Surgery;  Laterality: N/A;   RECTOCELE REPAIR     RIGHT/LEFT HEART CATH AND CORONARY ANGIOGRAPHY N/A 03/19/2018   Procedure: RIGHT/LEFT HEART CATH AND CORONARY ANGIOGRAPHY;  Surgeon: AWellington Hampshire MD;  [facility];  Service: Cardiovascular;  Laterality: N/A;   TEE WITHOUT CARDIOVERSION N/A 03/21/2018   Procedure: TRANSESOPHAGEAL ECHOCARDIOGRAM (TEE);  Surgeon: ORexene Alberts MD;  [place]  Service: Open Heart Surgery;  Laterality: N/A;   TONSILLECTOMY     TOTAL ABDOMINAL HYSTERECTOMY  01/09/1980   XI ROBOTIC ASSISTED PARAESOPHAGEAL HERNIA REPAIR N/A 11/14/2021   Procedure: XI ROBOTIC ASSISTED PARAESOPHAGEAL HERNIA REPAIR, RNFA to assist;  Surgeon: PJules Husbands MD;  [facility];  Service: General;  Laterality: N/A;    OB History   No obstetric history on file.      Home Medications    Prior to Admission medications   Medication Sig Start Date End Date Taking? Authorizing Provider  amoxicillin (AMOXIL) 875 MG tablet Take 1 tablet (875 mg total) by mouth 2 (two) times daily for 10 days. 02/16/22 02/26/22 Yes TSharion Balloon NP  acetaminophen (TYLENOL) 325 MG tablet Take 2 tablets (650 mg total) by mouth every 6 (six) hours as needed for mild pain (or Fever >/= 101). 05/09/21   ARicharda Osmond MD  Ascorbic Acid (VITAMIN C PO) Take by mouth daily at 12 noon.    [provider]  aspirin EC 81 MG tablet Take 1 tablet (81 mg total) by mouth daily. 05/16/21   McLean-Scocuzza, TNino Glow MD  atorvastatin (LIPITOR) 20 MG tablet Take 1 tablet (20 mg total) by mouth daily at 6 PM.  10/17/21   McLean-Scocuzza, TNino Glow MD  Calcium Carbonate-Vitamin D 500-125 MG-UNIT TABS Take 1 tablet by mouth daily.    [provider]  cholecalciferol (VITAMIN D) 1000 units tablet Take 1,000 Units by mouth daily.    [provider]  Flaxseed, Linseed, (FLAX SEED OIL) 1300 MG CAPS Take 1,100 mg by mouth daily.     [provider]  ipratropium-albuterol (DUONEB) 0.5-2.5 (3) MG/3ML SOLN INHALE THE CONTENTS OF ONE (1) VIAL VIA NEBULIZATION EVERY 6 HOURS AS NEEDED 08/15/21   WDutch QuintB, FNP  loratadine (CLARITIN) 10 MG tablet Take 1 tablet (10 mg total) by mouth daily. As needed at night 10/17/21 10/12/22  McLean-Scocuzza, TNino Glow MD  losartan (COZAAR) 25 MG tablet Take 0.5-1 tablets (12.5-25 mg total) by mouth 2 (two) times daily. If BP >140/>80 take losartan 25 mg bid 10/17/21   McLean-Scocuzza, TNino Glow MD  Multiple Vitamin (MULTIVITAMIN) tablet Take 1 tablet by mouth daily.    [provider]  Omega-3 Fatty Acids (FISH OIL) 1200 MG CAPS Take by mouth daily at 8 pm.    [provider]    Family History Family History  Problem Relation Age of Onset   Colon cancer Brother    Cancer Brother        neck tumor cancerous    Esophageal varices Brother    Colon cancer Maternal Uncle    Colon cancer Maternal Grandmother    Kidney disease Brother    Coronary artery disease Brother        with stent, cabg   Breast cancer Neg Hx     Social History Social History   Tobacco Use   Smoking status: Never   Smokeless tobacco: Never  Vaping Use   Vaping Use: Never used  Substance Use Topics   Alcohol use: No    Alcohol/week: 0.0 standard drinks of alcohol   Drug use: No     Allergies   Bee venom, Codeine, Morphine and related, Shellfish allergy, Bactrim [sulfamethoxazole-trimethoprim], Norvasc [amlodipine], and Nitrofurantoin   Review of Systems Review of Systems  Constitutional:  Negative for chills and fever.  HENT:  Positive for ear  pain. Negative for ear discharge and sore throat.   Respiratory:  Negative for cough and shortness of breath.   Gastrointestinal:  Negative for vomiting.  Skin:  Negative for color change and rash.  All other systems reviewed and are negative.    Physical Exam Triage Vital Signs ED Triage Vitals  Enc Vitals Group     BP      Pulse      Resp      Temp      Temp src      SpO2      Weight      Height      Head Circumference      Peak Flow      Pain Score      Pain Loc      Pain Edu?      Excl. in [place]?    No data found.  Updated Vital Signs BP (!) 146/77   Pulse 62   Temp 97.6 F (36.4 C)   Resp 18   LMP  (LMP Unknown)   SpO2 94%   Visual Acuity Right Eye Distance:   Left Eye Distance:   Bilateral Distance:    Right Eye Near:   Left Eye Near:    Bilateral Near:     Physical Exam Vitals and nursing note reviewed.  Constitutional:      General: She is not in acute distress.    Appearance: Normal appearance. She is well-developed. She is not ill-appearing.  HENT:     Right Ear: Tympanic membrane and ear canal normal.     Left Ear: Tympanic membrane is erythematous.     Nose: Nose normal.     Mouth/Throat:     Mouth: Mucous membranes are moist.     Pharynx: Oropharynx is clear.  Cardiovascular:     Rate and Rhythm: Normal rate and regular rhythm.     Heart sounds: Normal heart sounds.  Pulmonary:     Effort: Pulmonary effort is normal. No respiratory distress.     Breath sounds: Normal breath sounds.  Musculoskeletal:     Cervical back: Neck supple.  Skin:    General: Skin is warm and dry.  Neurological:     Mental Status: She is alert.  Psychiatric:        Mood and  Affect: Mood normal.        Behavior: Behavior normal.      UC Treatments / Results  Labs (all labs ordered are listed, but only abnormal results are displayed) Labs Reviewed - No data to display  EKG   Radiology No results found.  Procedures Procedures (including critical  care time)  Medications Ordered in UC Medications - No data to display  Initial Impression / Assessment and Plan / UC Course  I have reviewed the triage vital signs and the nursing notes.  Pertinent labs & imaging results that were available during my care of the patient were reviewed by me and considered in my medical decision making (see chart for details).    Left otitis media.  Treating with amoxicillin.  Discussed symptomatic treatment including Tylenol.  Instructed patient to follow up with her PCP if symptoms are not improving.  She agrees to plan of care.   Final Clinical Impressions(s) / UC Diagnoses   Final diagnoses:  Left otitis media, unspecified otitis media type     Discharge Instructions      Take the amoxicillin as directed.  Follow up with your primary care provider if your symptoms are not improving.        ED Prescriptions     Medication Sig Dispense Auth. Provider   amoxicillin (AMOXIL) 875 MG tablet Take 1 tablet (875 mg total) by mouth 2 (two) times daily for 10 days. 20 tablet Sharion Balloon, NP      PDMP not reviewed this encounter.   Sharion Balloon, NP 02/16/22 [phone]

## 2022-02-26 ENCOUNTER — Ambulatory Visit (INDEPENDENT_AMBULATORY_CARE_PROVIDER_SITE_OTHER): Payer: Medicare Other | Admitting: Surgery

## 2022-02-26 ENCOUNTER — Encounter: Payer: Self-pay | Admitting: Surgery

## 2022-02-26 VITALS — BP 163/90 | HR 81 | Ht 61.0 in | Wt 109.8 lb

## 2022-02-26 DIAGNOSIS — K449 Diaphragmatic hernia without obstruction or gangrene: Secondary | ICD-10-CM

## 2022-02-26 DIAGNOSIS — Z09 Encounter for follow-up examination after completed treatment for conditions other than malignant neoplasm: Secondary | ICD-10-CM | POA: Diagnosis not present

## 2022-02-26 DIAGNOSIS — Z8719 Personal history of other diseases of the digestive system: Secondary | ICD-10-CM

## 2022-02-26 NOTE — Patient Instructions (Signed)
If you have any concerns or questions, please feel free to call our office. Follow up as needed.   Laparoscopic Nissen Fundoplication, Care After The following information offers guidance on how to care for yourself after your procedure. Your health care provider may also give you more specific instructions. If you have problems or questions, contact your health care provider. What can I expect after the procedure? After the procedure, it is common to have: Trouble swallowing (dysphagia). Discomfort when you swallow. Soreness in your abdomen. Bloating. Follow these instructions at home: Medicines Take over-the-counter and prescription medicines only as told by your health care provider. Ask your health care provider or pharmacist if you can crush any pill that you are taking. Take only liquid medicines as told. Ask your health care provider if the medicine prescribed to you requires you to avoid driving or using machinery. Incision care  Follow instructions from your health care provider about how to take care of your incisions. Make sure you: Wash your hands with soap and water for at least 20 seconds before and after you change your bandage (dressing). If soap and water are not available, use hand sanitizer. Change your dressing as told by your health care provider. Leave stitches (sutures), skin glue, or adhesive strips in place. These skin closures may need to stay in place for 2 weeks or longer. If adhesive strip edges start to loosen and curl up, you may trim the loose edges. Do not remove adhesive strips completely unless your health care provider tells you to do that. Check your incision area every day for signs of infection. Check for: Redness, swelling, or pain. Fluid or blood. Warmth. Pus or a bad smell. Eating and drinking  Follow instructions from your health care provider about eating or drinking restrictions. Follow these instructions carefully. You may need to follow a  liquid-only diet for 2 weeks, followed by a diet of soft foods for 2 weeks. You should eat slow, take small bites, and chew food carefully. You should eat or drink in an upright position. You should return to your usual diet gradually. Drink enough fluid to keep your urine pale yellow. Activity  If you were given a sedative during the procedure, it can affect you for several hours. Do not drive or operate machinery until your health care provider says that it is safe. Rest as told by your health care provider. Avoid sitting for a long time without moving. Get up to take short walks every 1-2 hours. This is important to improve blood flow and breathing. Do not lift anything that is heavier than 10 lb (4.5 kg), or the limit that you are told, until your health care provider says that it is safe. Avoid activities that take a lot of effort. Ask your health care provider what activities are safe for you. Ask when you can: Return to sexual activity. Drive. Go back to work. Return to your normal activities as told by your health care provider. General instructions Do not take baths, swim, or use a hot tub until your health care provider approves. Ask your health care provider if you may take showers. You may only be allowed to take sponge baths. Do not use any products that contain nicotine or tobacco. These products include cigarettes, chewing tobacco, and vaping devices, such as e-cigarettes. These can delay healing. If you need help quitting, ask your health care provider. Keep all follow-up visits. This is important. Contact a health care provider if: You have chills  or a fever. You have trouble swallowing. You have painful bloating. You have persistent heartburn. You have pain that does not go away with medicine. You have frequent nausea or vomiting. Your incision opens up. You have any of these signs of infection: Redness, swelling, or pain around your incision. Fluid or blood coming  from your incision. Warmth coming from your incision. Pus or a bad smell coming from your incision. A fever. Get help right away if: You have severe pain or severe bloating. You are unable to swallow. You have vomiting that does not stop. You have blood in your vomit. You have trouble breathing. These symptoms may represent a serious problem that is an emergency. Do not wait to see if the symptoms will go away. Get medical help right away. Call your local emergency services (911 in the [place] Do not drive yourself to the hospital. Summary After the procedure, it is common to have trouble swallowing or have discomfort when you swallow. Follow instructions from your health care provider about eating or drinking restrictions. You may need to follow a liquid-only diet for 2 weeks, followed by a diet of soft foods for 2 weeks. Return to your normal activities as told by your health care provider. Keep all follow-up visits as told by your health care provider. This is important. This information is not intended to replace advice given to you by your health care provider. Make sure you discuss any questions you have with your health care provider. Document Revised: 07/12/2019 Document Reviewed: 07/12/2019 Elsevier Patient Education  [place].

## 2022-02-26 NOTE — Progress Notes (Signed)
Outpatient Surgical Follow Up  02/26/2022  Wanda Nelson is an 89 y.o. female.   Chief Complaint  Patient presents with   Follow-up    Paraesophageal hernia repair 11/14/21    HPI: Wanda Nelson is an 89 year old female status post repair of giant paraesophageal hernia 3 months ago.  She has done well.  She did have a visit to the emergency room about a month ago due to weakness.  At that time a CT scan was performed and I have personally reviewed and compared to the prior CT.  There is significant improvement and there is repair of the giant paraesophageal hernia.  There is no evidence of complicating features related to her surgery. Able to swallow still unable to eat bread.  I do suspect that she is Presbyesophagus and this is probably as good as she is going to get from the functioning of the esophagus.  No fevers no chills.  Past Medical History:  Diagnosis Date   Actinic keratosis    Anemia    Aortic atherosclerosis ([place])    Atrial myxoma 03/17/2018   a.) TTE 03/17/2018 --> 2.4 x 2.5 cm LEFT atrial mass attached at fossa ovalis/atrial septum; pedunculated in shape and solid in texture; b.) s/p atrial myoxma resection 03/21/2018; initially attempted via minimally invasive RIGHT thoracotomy approach, however due to bleeding complications, procedure required median sternotomy and patient going on cardiopulmonary bypass   Basal cell carcinoma 05/17/2021   L upper back 5.0 cm lat to spine - ED&C   Diastolic dysfunction    a.) TTE 03/17/2018: EF 55-60%, LVH, mild PR, G1DD; b.) TTE 07/25/2018: EF 60-65%, RVSP 44.9, mild-mod MR/TR, G1DD; c.) TTE 12/18/2018: EF 60-65%; d.) TTE 06/12/2019: EF 50-55, mild BAE, triv TR, PASP 37.5, G2DD; e.) TTE 07/22/2020: EF 60-65%, mild MR, mod TR, RVSP 42.7, G1DD; f.) TTE 05/08/2021: EF 60-65%, mild LVH, mild-mod TR, AoV sclerosis without stenosis   Dyspnea    Fatigue 12/19/2021   GERD (gastroesophageal reflux disease)    Glaucoma    Hemorrhagic  cerebrovascular accident (CVA) ([place]) 1990   Hiatal hernia    Hiatal hernia 05/16/2021   History of 2019 novel coronavirus disease (COVID-19)    a.) 05/30/20 - Tx'd with "covid 19 pill"; b.) 10/2021   History of blood transfusion    History of hiatal hernia    History of left atrial appendage closure 03/21/2018   a.) #45 Atricure   Hyperlipidemia    Hypertension    Hypoxia    Incidental pulmonary nodule, > 62m and < 846m03/12/2018   Noted on CT scan - needs f/u imaging 6 months   Lung fibrosis ([facility]11/09/2020   NSVT (nonsustained ventricular tachycardia) ([facility]05/05/2018   a.) holter 05/13/2018 --> 5 short runs   PAF (paroxysmal atrial fibrillation) ([facility] [place]05/05/2018   a.) holter 05/13/2018 --> intermittent episodes with longest lasting approx 1.5 hours (burden <1%).   Pain due to onychomycosis of toenails of both feet 07/28/2020   Positive self-administered antigen test for COVID-19 10/18/2021   PSVT (paroxysmal supraventricular tachycardia) 05/13/2018   a.) holter 05/13/2018 --> frequent runs of SVT noted during study   Pulmonary fibrosis ([facility]   Squamous cell carcinoma of skin 11/12/2019   L distal dorsum forearm - ED&C    Viral URI 05/31/2020    Past Surgical History:  Procedure Laterality Date   BUNIONECTOMY Bilateral    CLIPPING OF ATRIAL APPENDAGE  03/21/2018   Procedure: Clipping Of Atrial Appendage - using an AtriCure 4572m  clip;  Surgeon: Rexene Alberts, MD;  Location: [place];  Service: Open Heart Surgery;;   ESOPHAGOGASTRODUODENOSCOPY (EGD) WITH PROPOFOL N/A 09/06/2021   Procedure: ESOPHAGOGASTRODUODENOSCOPY (EGD) WITH PROPOFOL;  Surgeon: Jonathon Bellows, MD;  [facility];  Service: Gastroenterology;  Laterality: N/A;   EXCISION OF ATRIAL MYXOMA N/A 03/21/2018   Procedure: Excision Of Atrial Myxoma;  Surgeon: Rexene Alberts, MD;  Location: [place];  Service: Open Heart Surgery;  Laterality: N/A;   RECTOCELE REPAIR     RIGHT/LEFT HEART CATH AND CORONARY  ANGIOGRAPHY N/A 03/19/2018   Procedure: RIGHT/LEFT HEART CATH AND CORONARY ANGIOGRAPHY;  Surgeon: Wellington Hampshire, MD;  Location: [place] [facility] LAB;  Service: Cardiovascular;  Laterality: N/A;   TEE WITHOUT CARDIOVERSION N/A 03/21/2018   Procedure: TRANSESOPHAGEAL ECHOCARDIOGRAM (TEE);  Surgeon: Rexene Alberts, MD;  Location: [place];  Service: Open Heart Surgery;  Laterality: N/A;   TONSILLECTOMY     TOTAL ABDOMINAL HYSTERECTOMY  01/09/1980   XI ROBOTIC ASSISTED PARAESOPHAGEAL HERNIA REPAIR N/A 11/14/2021   Procedure: XI ROBOTIC ASSISTED PARAESOPHAGEAL HERNIA REPAIR, RNFA to assist;  Surgeon: Jules Husbands, MD;  Location: [facility];  Service: General;  Laterality: N/A;    Family History  Problem Relation Age of Onset   Colon cancer Brother    Cancer Brother        neck tumor cancerous    Esophageal varices Brother    Colon cancer Maternal Uncle    Colon cancer Maternal Grandmother    Kidney disease Brother    Coronary artery disease Brother        with stent, cabg   Breast cancer Neg Hx     Social History:  reports that she has never smoked. She has never used smokeless tobacco. She reports that she does not drink alcohol and does not use drugs.  Allergies:  Allergies  Allergen Reactions   Bee Venom Anaphylaxis and Hives   Codeine Other (See Comments)    Altered mental status Altered mental status   Morphine And Related     Altered mental status   Shellfish Allergy Anaphylaxis and Hives    Mainly shrimp   Bactrim [Sulfamethoxazole-Trimethoprim]     ? Reaction     Norvasc [Amlodipine]     2.5 leg edema    Nitrofurantoin Rash    Medications reviewed.    ROS Full ROS performed and is otherwise negative other than what is stated in HPI   BP (!) 163/90   Pulse 81   Ht 5' 1"$  (1.549 m)   Wt 109 lb 12.8 oz (49.8 kg)   LMP  (LMP Unknown)   SpO2 94%   BMI 20.75 kg/m   Physical Exam Physical Exam Vitals and nursing note reviewed. Exam conducted with a  chaperone present.  Constitutional:      General: She is not in acute distress.    Appearance: Normal appearance. She is not ill-appearing, toxic-appearing or diaphoretic.  HENT:     Mouth/Throat:     Pharynx: No oropharyngeal exudate or posterior oropharyngeal erythema.  Eyes:     General: No scleral icterus.       Right eye: No discharge.        Left eye: No discharge.  Neck:     Vascular: No carotid bruit.     Comments: Significant kyphosis but no major significant range of motion issues Cardiovascular:     Rate and Rhythm: Normal rate and regular rhythm.     Heart sounds: No  murmur heard.    No friction rub.  Pulmonary:     Effort: Pulmonary effort is normal. No respiratory distress.     Breath sounds: Normal breath sounds. No stridor. No wheezing or rhonchi.  Abdominal: \ Incisions healed no evidence of infection no evidence of incisional hernias.  No peritonitis Musculoskeletal:        General: No swelling or tenderness. Normal range of motion.     Cervical back: Normal range of motion and neck supple. No rigidity or tenderness.  Lymphadenopathy:     Cervical: No cervical adenopathy.  Skin:    General: Skin is warm and dry.     Capillary Refill: Capillary refill takes less than 2 seconds.  Neurological:     General: No focal deficit present.     Mental Status: She is alert and oriented to person, place, and time.  Psychiatric:        Mood and Affect: Mood normal.        Behavior: Behavior normal.        Thought Content: Thought content normal.        Judgment: Judgment normal.      Assessment/Plan: 89 year old female status post paraesophageal hernia repair and partial fundoplication 3 months ago doing very well considering her age and the size of the child hernia.  At this time I do not recommend any further testing or interventions. Will be happy to see her on a as needed basis  Please note that I spent  30 minutes in this encounter including personally reviewing  imaging studies, coordination of her care, counseling the patient and performing a proper documentation   Caroleen Hamman, MD [facility] General Surgeon

## 2022-02-28 ENCOUNTER — Ambulatory Visit: Payer: Medicare Other | Admitting: Surgery

## 2022-03-01 ENCOUNTER — Emergency Department
Admission: EM | Admit: 2022-03-01 | Discharge: 2022-03-01 | Disposition: A | Discharge status: Home / Self Care | Payer: Medicare Other | Attending: Emergency Medicine | Admitting: Emergency Medicine

## 2022-03-01 ENCOUNTER — Other Ambulatory Visit: Payer: Self-pay

## 2022-03-01 ENCOUNTER — Emergency Department: Payer: Medicare Other

## 2022-03-01 DIAGNOSIS — S199XXA Unspecified injury of neck, initial encounter: Secondary | ICD-10-CM | POA: Diagnosis not present

## 2022-03-01 DIAGNOSIS — S0083XA Contusion of other part of head, initial encounter: Secondary | ICD-10-CM | POA: Diagnosis not present

## 2022-03-01 DIAGNOSIS — S0993XA Unspecified injury of face, initial encounter: Secondary | ICD-10-CM | POA: Diagnosis not present

## 2022-03-01 DIAGNOSIS — W010XXA Fall on same level from slipping, tripping and stumbling without subsequent striking against object, initial encounter: Secondary | ICD-10-CM | POA: Insufficient documentation

## 2022-03-01 DIAGNOSIS — S0990XA Unspecified injury of head, initial encounter: Secondary | ICD-10-CM | POA: Diagnosis present

## 2022-03-01 DIAGNOSIS — I10 Essential (primary) hypertension: Secondary | ICD-10-CM | POA: Insufficient documentation

## 2022-03-01 DIAGNOSIS — W19XXXA Unspecified fall, initial encounter: Secondary | ICD-10-CM

## 2022-03-01 NOTE — Discharge Instructions (Signed)
Follow-up with your regular doctor as.  Return emergency department worsening.  Apply ice to all areas that hurt and bruised.

## 2022-03-01 NOTE — ED Provider Notes (Signed)
[facility] Provider Note    Event Date/Time   First MD Initiated Contact with Patient 03/01/22 0957     (approximate)   History   Fall   HPI  Wanda Nelson is a 89 y.o. female with history of hypertension, atherosclerosis, pulmonary fibrosis among other chronic medical problems presents emergency department after mechanical fall.  Patient states she was going for her 2 mile walk this morning when she tripped over a curb.  States she has been taking a class where she learned how to fall without hurting herself.  However she still hit her head.  States she may have bruised her shoulder but does not hurt in the bone.  No LOC.      Physical Exam   Triage Vital Signs: ED Triage Vitals  Enc Vitals Group     BP 03/01/22 0945 (!) 160/81     Pulse Rate 03/01/22 0944 86     Resp 03/01/22 0944 16     Temp 03/01/22 0944 97.7 F (36.5 C)     Temp Source 03/01/22 0944 Oral     SpO2 03/01/22 0944 95 %     Weight 03/01/22 0945 109 lb 12.6 oz (49.8 kg)     Height 03/01/22 0945 5' 1"$  (1.549 m)     Head Circumference --      Peak Flow --      Pain Score 03/01/22 0944 4     Pain Loc --      Pain Edu? --      Excl. in [place]? --     Most recent vital signs: Vitals:   03/01/22 0944 03/01/22 0945  BP:  (!) 160/81  Pulse: 86   Resp: 16   Temp: 97.7 F (36.5 C)   SpO2: 95%      General: Awake, no distress.   CV:  Good peripheral perfusion. regular rate and  rhythm Resp:  Normal effort.  Abd:  No distention.   Other:  Bruising noted around the left orbit, neck is supple, slightly tender along C-spine, bruise noted on left shoulder but no bony tenderness appreciated, patient has full range of motion, neurovascular intact   ED Results / Procedures / Treatments   Labs (all labs ordered are listed, but only abnormal results are displayed) Labs Reviewed - No data to display   EKG     RADIOLOGY CT of the head, C-spine,  maxillofacial    PROCEDURES:   Procedures   MEDICATIONS ORDERED IN ED: Medications - No data to display   IMPRESSION / MDM / [place] / ED COURSE  I reviewed the triage vital signs and the nursing notes.                              Differential diagnosis includes, but is not limited to, fracture, contusion, sprain, subdural, subarachnoid  Patient's presentation is most consistent with acute presentation with potential threat to life or bodily function.   Due to the patient's age and mechanical fall we will do CT of the head, C-spine, maxillofacial to assess for fracture, subdural etc.  CT of the head, C-spine, maxillofacial were independently reviewed and interpreted by me as being negative for any acute abnormality  The patient appears to be very well.  She is able to ambulate without any difficulty.  She is with her daughter and wants to leave and go to lunch.  I do not  feel that the patient would benefit from an admission as she appears to be very well.  She is in agreement treatment plan.  Discharged in stable condition with instructions to return if worsening.      FINAL CLINICAL IMPRESSION(S) / ED DIAGNOSES   Final diagnoses:  Fall, initial encounter  Contusion of face, initial encounter     Rx / DC Orders   ED Discharge Orders     None        Note:  This document was prepared using Dragon voice recognition software and may include unintentional dictation errors.    Versie Starks, PA-C 03/01/22 1234    Rada Hay, MD 03/01/22 [phone]

## 2022-03-01 NOTE — ED Triage Notes (Signed)
Pt here with a fall. Pt states she was walking and tripped on a curb and hit her head. Pt has a small abrasion on her left eye. Pt is not on a blood thinner.

## 2022-03-14 DIAGNOSIS — H90A22 Sensorineural hearing loss, unilateral, left ear, with restricted hearing on the contralateral side: Secondary | ICD-10-CM | POA: Diagnosis not present

## 2022-03-14 DIAGNOSIS — H6983 Other specified disorders of Eustachian tube, bilateral: Secondary | ICD-10-CM | POA: Diagnosis not present

## 2022-03-14 DIAGNOSIS — H6123 Impacted cerumen, bilateral: Secondary | ICD-10-CM | POA: Diagnosis not present

## 2022-03-19 ENCOUNTER — Other Ambulatory Visit: Payer: Self-pay | Admitting: Family Medicine

## 2022-03-19 DIAGNOSIS — Z1231 Encounter for screening mammogram for malignant neoplasm of breast: Secondary | ICD-10-CM

## 2022-03-26 DIAGNOSIS — H0288B Meibomian gland dysfunction left eye, upper and lower eyelids: Secondary | ICD-10-CM | POA: Diagnosis not present

## 2022-03-26 DIAGNOSIS — H401112 Primary open-angle glaucoma, right eye, moderate stage: Secondary | ICD-10-CM | POA: Diagnosis not present

## 2022-03-26 DIAGNOSIS — Z961 Presence of intraocular lens: Secondary | ICD-10-CM | POA: Diagnosis not present

## 2022-03-26 DIAGNOSIS — H02886 Meibomian gland dysfunction of left eye, unspecified eyelid: Secondary | ICD-10-CM | POA: Diagnosis not present

## 2022-03-26 DIAGNOSIS — H0288A Meibomian gland dysfunction right eye, upper and lower eyelids: Secondary | ICD-10-CM | POA: Diagnosis not present

## 2022-03-26 DIAGNOSIS — H401123 Primary open-angle glaucoma, left eye, severe stage: Secondary | ICD-10-CM | POA: Diagnosis not present

## 2022-03-26 DIAGNOSIS — H16223 Keratoconjunctivitis sicca, not specified as Sjogren's, bilateral: Secondary | ICD-10-CM | POA: Diagnosis not present

## 2022-03-26 DIAGNOSIS — H02883 Meibomian gland dysfunction of right eye, unspecified eyelid: Secondary | ICD-10-CM | POA: Diagnosis not present

## 2022-04-06 ENCOUNTER — Ambulatory Visit
Admission: RE | Admit: 2022-04-06 | Discharge: 2022-04-06 | Disposition: A | Discharge status: Home / Self Care | Payer: Medicare Other | Source: Ambulatory Visit | Attending: Family Medicine | Admitting: Family Medicine

## 2022-04-06 DIAGNOSIS — Z1231 Encounter for screening mammogram for malignant neoplasm of breast: Secondary | ICD-10-CM | POA: Diagnosis not present

## 2022-04-11 ENCOUNTER — Telehealth: Payer: Self-pay | Admitting: Family Medicine

## 2022-04-11 ENCOUNTER — Ambulatory Visit (INDEPENDENT_AMBULATORY_CARE_PROVIDER_SITE_OTHER): Payer: Medicare Other | Admitting: Family Medicine

## 2022-04-11 ENCOUNTER — Encounter: Payer: Self-pay | Admitting: Family Medicine

## 2022-04-11 VITALS — BP 120/70 | HR 80 | Temp 97.7°F | Ht 61.0 in | Wt 104.0 lb

## 2022-04-11 DIAGNOSIS — R634 Abnormal weight loss: Secondary | ICD-10-CM | POA: Diagnosis not present

## 2022-04-11 DIAGNOSIS — K439 Ventral hernia without obstruction or gangrene: Secondary | ICD-10-CM | POA: Diagnosis not present

## 2022-04-11 DIAGNOSIS — R300 Dysuria: Secondary | ICD-10-CM | POA: Diagnosis not present

## 2022-04-11 DIAGNOSIS — I10 Essential (primary) hypertension: Secondary | ICD-10-CM | POA: Diagnosis not present

## 2022-04-11 LAB — COMPREHENSIVE METABOLIC PANEL
ALT: 13 U/L (ref 0–35)
AST: 20 U/L (ref 0–37)
Albumin: 4.2 g/dL (ref 3.5–5.2)
Alkaline Phosphatase: 77 U/L (ref 39–117)
BUN: 11 mg/dL (ref 6–23)
CO2: 28 mEq/L (ref 19–32)
Calcium: 9.3 mg/dL (ref 8.4–10.5)
Chloride: 104 mEq/L (ref 96–112)
Creatinine, Ser: 0.75 mg/dL (ref 0.40–1.20)
GFR: 70.77 mL/min (ref >60.00)
Glucose, Bld: 87 mg/dL (ref 70–99)
Potassium: 4 mEq/L (ref 3.5–5.1)
Sodium: 138 mEq/L (ref 135–145)
Total Bilirubin: 0.7 mg/dL (ref 0.2–1.2)
Total Protein: 7.2 g/dL (ref 6.0–8.3)

## 2022-04-11 LAB — POCT URINALYSIS DIPSTICK
Bilirubin, UA: NEGATIVE
Glucose, UA: NEGATIVE
Ketones, UA: NEGATIVE
Nitrite, UA: NEGATIVE
Protein, UA: NEGATIVE
Spec Grav, UA: 1.015 (ref 1.010–1.025)
Urobilinogen, UA: 0.2 E.U./dL
pH, UA: 7.5 (ref 5.0–8.0)

## 2022-04-11 LAB — CBC WITH DIFFERENTIAL/PLATELET
Basophils Absolute: 0.1 10*3/uL (ref 0.0–0.1)
Basophils Relative: 0.8 % (ref 0.0–3.0)
Eosinophils Absolute: 0.4 10*3/uL (ref 0.0–0.7)
Eosinophils Relative: 5.3 % — ABNORMAL HIGH (ref 0.0–5.0)
HCT: 42.3 % (ref 36.0–46.0)
Hemoglobin: 14.1 g/dL (ref 12.0–15.0)
Lymphocytes Relative: 24.8 % (ref 12.0–46.0)
Lymphs Abs: 1.8 10*3/uL (ref 0.7–4.0)
MCHC: 33.2 g/dL (ref 30.0–36.0)
MCV: 94.8 fl (ref 78.0–100.0)
Monocytes Absolute: 0.6 10*3/uL (ref 0.1–1.0)
Monocytes Relative: 8 % (ref 3.0–12.0)
Neutro Abs: 4.4 10*3/uL (ref 1.4–7.7)
Neutrophils Relative %: 61.1 % (ref 43.0–77.0)
Platelets: 160 10*3/uL (ref 150.0–400.0)
RBC: 4.46 Mil/uL (ref 3.87–5.11)
RDW: 14.4 % (ref 11.5–15.5)
WBC: 7.2 10*3/uL (ref 4.0–10.5)

## 2022-04-11 LAB — MAGNESIUM: Magnesium: 2 mg/dL (ref 1.5–2.5)

## 2022-04-11 LAB — PHOSPHORUS: Phosphorus: 3.2 mg/dL (ref 2.3–4.6)

## 2022-04-11 NOTE — Patient Instructions (Signed)
It was a pleasure meeting you today. Thank you for allowing me to take part in your health care.  Our goals for today as we discussed include:  Will get blood work today  Will get imaging of abdomen.  They will call you with an appointment  Sending urine for further evaluation.  Will call you if needing to start antibiotics  Follow up in 1 month  Please arrive 15 minutes prior to your appointment.  Arrivals 5 minutes past your appointment time will need to be rescheduled.  This is to ensure that all patients are seen in a timely manner.  Thank you for your understanding and cooperation.   If you have any questions or concerns, please do not hesitate to call the office at [phone].  I look forward to our next visit and until then take care and stay safe.  Regards,   Carollee Leitz, MD   [facility]

## 2022-04-11 NOTE — Progress Notes (Signed)
SUBJECTIVE:   Chief Complaint  Patient presents with   Medical Management of Chronic Issues    3 mo follow up/ UTI symptoms/ lump on side   HPI Patient presents to clinic for 65-month follow-up anemia.  Acute concerns today include lump on lower dimension and urinary burning.  Abdominal mass Patient reports history of constipation.  Had previously been taking MiraLAX but stopped and started taking Metamucil.  Stools to become solid and last 2 to 3 days.  Stopped Metamucil 3 days ago.  Reports finding a small lump in shower 5 days ago but notices that when lying down lump goes away.  Denies any pain, fevers, change in bowel habits, hematuria, diarrhea or bloody stool.  Endorses 15 pound weight loss since hernia repair in November 2023.  No change in appetite.  Walks 2 to 3 miles a day.  Reports feeling fine.    Urinary burning Symptoms x 1 day.  Reports having urinary burning upon awakening but continues to resolve throughout the day.  Has been drinking 16 to 20 ounces of fluid daily.  Denies any hematuria, back pain, fevers, nausea/vomiting, suprapubic pain.    PERTINENT PMH / PSH: Hypertension Pulmonary fibrosis Paraesophageal hernia s/p repair History of dysphagia   OBJECTIVE:  BP 120/70   Pulse 80   Temp 97.7 F (36.5 C) (Oral)   Ht  (1.549 m)   Wt 104 lb (47.2 kg)   LMP  (LMP Unknown)   SpO2 95%   BMI 19.65 kg/m    Physical Exam Vitals reviewed.  Constitutional:      General: She is not in acute distress.    Appearance: Normal appearance. She is normal weight. She is not ill-appearing, toxic-appearing or diaphoretic.  Eyes:     General:        Right eye: No discharge.        Left eye: No discharge.     Conjunctiva/sclera: Conjunctivae normal.  Cardiovascular:     Rate and Rhythm: Normal rate and regular rhythm.     Heart sounds: Normal heart sounds.  Pulmonary:     Effort: Pulmonary effort is normal.     Breath sounds: Normal breath sounds.   Abdominal:     General: Bowel sounds are normal. There is no distension.     Palpations: Abdomen is soft.     Tenderness: There is no abdominal tenderness. There is no right CVA tenderness, left CVA tenderness or guarding.     Hernia: A hernia is present. Hernia is present in the right inguinal area.  Musculoskeletal:        General: Normal range of motion.  Skin:    General: Skin is warm and dry.  Neurological:     General: No focal deficit present.     Mental Status: She is alert and oriented to person, place, and time. Mental status is at baseline.  Psychiatric:        Mood and Affect: Mood normal.        Behavior: Behavior normal.        Thought Content: Thought content normal.        Judgment: Judgment normal.     ASSESSMENT/PLAN:  Burning with urination Assessment & Plan: Low suspicion for pyelonephritis given no fevers, CVA tenderness and symptoms started 1 day ago.  Leukocytes noted in POC urine. Urine culture positive for Proteus mirabilis. Start cefdinir 300 mg twice daily Start probiotics daily and continue for 14 days after completion of antibiotics.  Orders: -     POCT urinalysis dipstick -     Urine Culture  Weight loss, non-intentional Assessment & Plan: Status post hernia repair 11/2021.  Appetite remains good.  No changes in bowel habits or movements.   Denies any hematochezia or melena.  History of dysphagia, previously evaluated by GI and plan for possible esophageal dilation status post esophageal hernia repair. Recommend boost 3 times daily Plan for abdominal imaging Follow-up with GI if no improvement.   Orders: -     CBC with Differential/Platelet -     Comprehensive metabolic panel -     Magnesium -     Phosphorus -     CT ABDOMEN PELVIS W CONTRAST; Future  Hernia of abdominal wall Assessment & Plan: New problem.  Suspect right lower quadrant abdominal hernia. Reducible right lower quadrant nonpulsatile mass.  Nontender, no erythema or edema  appreciated. Given recent weight loss and new finding we will plan to image to rule out malignancy.    Orders: -     CT ABDOMEN PELVIS W CONTRAST; Future  Essential hypertension Assessment & Plan: Chronic.  Stable.  Initial blood pressure increased, repeat at goal. Continue Cozaar 25 mg twice daily.  Monitor dose with decreasing weight.     PDMP reviewed  Return in about 4 weeks (around 05/09/2022) for PCP.  Dana Allan, MD   Addendum 04/15/2022 Called patient to notify of results of recent urine culture.  Positive for Proteus mirabilis.  Discussed initiation of cefdinir 300 mg twice daily and probiotics daily.  Patient agreeable.  Denies any fevers, back pain, worsening urinary symptoms or decrease in urine.  Endorses drinking water and cranberry juice.

## 2022-04-11 NOTE — Telephone Encounter (Signed)
Lft pt vm to call ofc to sch CT. thanks 

## 2022-04-14 ENCOUNTER — Other Ambulatory Visit: Payer: Self-pay | Admitting: Family Medicine

## 2022-04-14 DIAGNOSIS — N3000 Acute cystitis without hematuria: Secondary | ICD-10-CM

## 2022-04-14 LAB — URINE CULTURE
MICRO NUMBER:: 14777609
SPECIMEN QUALITY:: ADEQUATE

## 2022-04-14 MED ORDER — SACCHAROMYCES BOULARDII 250 MG PO CAPS: 250.0000 mg | ORAL_CAPSULE | Freq: Every day | ORAL | 0 refills | Status: DC

## 2022-04-14 MED ORDER — CEFDINIR 300 MG PO CAPS: 300.0000 mg | ORAL_CAPSULE | Freq: Two times a day (BID) | ORAL | 0 refills | Status: DC

## 2022-04-15 ENCOUNTER — Encounter: Payer: Self-pay | Admitting: Family Medicine

## 2022-04-15 DIAGNOSIS — R300 Dysuria: Secondary | ICD-10-CM | POA: Insufficient documentation

## 2022-04-15 DIAGNOSIS — R634 Abnormal weight loss: Secondary | ICD-10-CM

## 2022-04-15 DIAGNOSIS — K439 Ventral hernia without obstruction or gangrene: Secondary | ICD-10-CM | POA: Insufficient documentation

## 2022-04-15 HISTORY — DX: Abnormal weight loss: R63.4

## 2022-04-15 NOTE — Assessment & Plan Note (Signed)
Low suspicion for pyelonephritis given no fevers, CVA tenderness and symptoms started 1 day ago.  Leukocytes noted in POC urine. Urine culture positive for Proteus mirabilis. Start cefdinir 300 mg twice daily Start probiotics daily and continue for 14 days after completion of antibiotics.

## 2022-04-15 NOTE — Assessment & Plan Note (Signed)
Chronic.  Stable.  Initial blood pressure increased, repeat at goal. Continue Cozaar 25 mg twice daily.  Monitor dose with decreasing weight.

## 2022-04-15 NOTE — Assessment & Plan Note (Signed)
Status post hernia repair 11/2021.  Appetite remains good.  No changes in bowel habits or movements.   Denies any hematochezia or melena.  History of dysphagia, previously evaluated by GI and plan for possible esophageal dilation status post esophageal hernia repair. Recommend boost 3 times daily Plan for abdominal imaging Follow-up with GI if no improvement.

## 2022-04-15 NOTE — Assessment & Plan Note (Addendum)
New problem.  Suspect right lower quadrant abdominal hernia. Reducible right lower quadrant nonpulsatile mass.  Nontender, no erythema or edema appreciated. Given recent weight loss and new finding we will plan to image to rule out malignancy.

## 2022-04-19 ENCOUNTER — Other Ambulatory Visit: Payer: Self-pay | Admitting: Family Medicine

## 2022-04-19 ENCOUNTER — Encounter: Payer: Self-pay | Admitting: Family Medicine

## 2022-04-19 ENCOUNTER — Ambulatory Visit
Admission: RE | Admit: 2022-04-19 | Discharge: 2022-04-19 | Disposition: A | Discharge status: Home / Self Care | Payer: Medicare Other | Source: Ambulatory Visit | Attending: Family Medicine | Admitting: Family Medicine

## 2022-04-19 DIAGNOSIS — R634 Abnormal weight loss: Secondary | ICD-10-CM | POA: Insufficient documentation

## 2022-04-19 DIAGNOSIS — N3000 Acute cystitis without hematuria: Secondary | ICD-10-CM

## 2022-04-19 DIAGNOSIS — K439 Ventral hernia without obstruction or gangrene: Secondary | ICD-10-CM | POA: Insufficient documentation

## 2022-04-19 DIAGNOSIS — K5730 Diverticulosis of large intestine without perforation or abscess without bleeding: Secondary | ICD-10-CM | POA: Diagnosis not present

## 2022-04-19 DIAGNOSIS — K3189 Other diseases of stomach and duodenum: Secondary | ICD-10-CM | POA: Diagnosis not present

## 2022-04-19 MED ORDER — IOHEXOL 300 MG/ML  SOLN
85.0000 mL | Freq: Once | INTRAMUSCULAR | Status: AC | PRN
  Administered 2022-04-19: 85 mL via INTRAVENOUS

## 2022-04-19 MED ORDER — CEFDINIR 300 MG PO CAPS: 300.0000 mg | ORAL_CAPSULE | Freq: Two times a day (BID) | ORAL | 0 refills | Status: AC

## 2022-04-19 MED ORDER — BARIUM SULFATE 2 % PO SUSP
900.0000 mL | Freq: Once | ORAL | Status: AC
  Administered 2022-04-19: 900 mL via ORAL

## 2022-04-19 NOTE — Telephone Encounter (Signed)
Sent refill. Continue probiotics daily

## 2022-04-24 ENCOUNTER — Telehealth: Payer: Self-pay | Admitting: *Deleted

## 2022-04-24 NOTE — Telephone Encounter (Signed)
Pt returned Wanda Nelson CMA call. Note below was read to her. Pt its scheduled to see provider 4/22 .

## 2022-04-24 NOTE — Telephone Encounter (Signed)
    Left voicemail to return call (see below)    Dana Allan, MD 04/23/2022  8:06 PM EDT   Please schedule patient for follow and review of imaging

## 2022-04-29 NOTE — Patient Instructions (Addendum)
It was a pleasure meeting you today. Thank you for allowing me to take part in your health care.  Our goals for today as we discussed include:  Stop Metamucil Stop probiotics Start MiraLAX 1 scoop daily Start senna-Colace 1 tablet daily Increase water intake  Start Protonix 40 mg 2 times a day  Follow-up in 2 weeks.   If you have any questions or concerns, please do not hesitate to call the office at [phone].  I look forward to our next visit and until then take care and stay safe.  Regards,   Dana Allan, MD   [facility]

## 2022-04-30 ENCOUNTER — Ambulatory Visit (INDEPENDENT_AMBULATORY_CARE_PROVIDER_SITE_OTHER): Payer: Medicare Other | Admitting: Family Medicine

## 2022-04-30 ENCOUNTER — Encounter: Payer: Self-pay | Admitting: Family Medicine

## 2022-04-30 VITALS — BP 136/66 | HR 58 | Temp 99.5°F | Resp 16 | Ht 61.0 in | Wt 102.5 lb

## 2022-04-30 DIAGNOSIS — R300 Dysuria: Secondary | ICD-10-CM

## 2022-04-30 DIAGNOSIS — K219 Gastro-esophageal reflux disease without esophagitis: Secondary | ICD-10-CM

## 2022-04-30 DIAGNOSIS — K5900 Constipation, unspecified: Secondary | ICD-10-CM

## 2022-04-30 DIAGNOSIS — R634 Abnormal weight loss: Secondary | ICD-10-CM | POA: Diagnosis not present

## 2022-04-30 MED ORDER — POLYETHYLENE GLYCOL 3350 17 GM/SCOOP PO POWD: 17.0000 g | Freq: Every day | ORAL | 1 refills | Status: DC

## 2022-04-30 MED ORDER — SENNA-DOCUSATE SODIUM 8.6-50 MG PO TABS: 1.0000 | ORAL_TABLET | Freq: Every day | ORAL | 3 refills | Status: DC

## 2022-04-30 NOTE — Progress Notes (Signed)
SUBJECTIVE:   Chief Complaint  Patient presents with   CT Results    HPI Patient presents to clinic to discuss recent CT abdomen results  Patient recently presented to clinic for further right lower protruding mass in abdomen.  CT abdomen did not show any findings of abdominal hernia.  Findings are consistent with distended stomach with fluid and debris of uncertain etiology.  Left colonic diverticula with moderate colonic stool, no bowel obstruction or free air, fluid noted.  Patulous esophagus with luminal air-contrast noted.  This was previously noted on barium swallow 07/14/2021.  Today she reports protruding mass has somewhat subsided since having more bowel movements.  Denies any abdominal pain, constipation, diarrhea or bloody stool.  Endorses belching.  Denies any nausea/vomiting.  Had stopped taking Metamucil  Was previously taking laxatives for constipation.     Recent UTI Urine culture positive for Proteus mirabilis.  Initially treated with 5-day course of cefdinir 300 mg twice daily, patient continued to have symptoms and was prescribed additional 5 days.  Symptoms improved.   PERTINENT PMH / PSH: Status post paraesophageal hernia Laryngal pharyngeal reflux Anemia History of dysphagia  OBJECTIVE:  BP 136/66   Pulse (!) 58   Temp 99.5 F (37.5 C)   Resp 16   Ht 5\' 1"  (1.549 m)   Wt 102 lb 8 oz (46.5 kg)   LMP  (LMP Unknown)   SpO2 98%   BMI 19.37 kg/m    Physical Exam Vitals reviewed.  Constitutional:      General: She is not in acute distress.    Appearance: Normal appearance. She is normal weight. She is not ill-appearing, toxic-appearing or diaphoretic.  Eyes:     General:        Right eye: No discharge.        Left eye: No discharge.     Conjunctiva/sclera: Conjunctivae normal.  Cardiovascular:     Rate and Rhythm: Normal rate.  Pulmonary:     Effort: Pulmonary effort is normal.  Abdominal:     General: Abdomen is flat. Bowel sounds are  normal. There is no distension.     Palpations: Abdomen is soft. There is no mass.     Tenderness: There is no abdominal tenderness.     Hernia: No hernia is present.  Skin:    General: Skin is warm and dry.  Neurological:     Mental Status: She is alert and oriented to person, place, and time. Mental status is at baseline.  Psychiatric:        Mood and Affect: Mood normal.        Behavior: Behavior normal.        Thought Content: Thought content normal.        Judgment: Judgment normal.     ASSESSMENT/PLAN:  Constipation, unspecified constipation type Assessment & Plan: Patient noted to have distended stomach, moderate stool and left-sided colonic diverticula on CT.  Patulous esophagus likely secondary to recent paraesophageal hernia repair. No new abdomen on exam.  Right lower quadrant bulge has subsided.  Bowel movements more frequent. Will treat constipation given symptoms have slightly improved. Start MiraLAX daily Start senna-DS daily   Orders: -     Polyethylene Glycol 3350; Take 17 g by mouth daily.  Dispense: 3350 g; Refill: 1 -     Senna-Docusate Sodium; Take 1 tablet by mouth daily.  Dispense: 90 tablet; Refill: 3  Laryngopharyngeal reflux disease -     Pantoprazole Sodium; Take 1 tablet (40  mg total) by mouth 2 (two) times daily.  Dispense: 180 tablet; Refill: 0  Burning with urination Assessment & Plan: Resolved with 10-day treatment of cefdinir 300 mg twice daily Plan to repeat urine at next visit    Weight loss, non-intentional Assessment & Plan: Suspect constipation contributing to feeling fullness. Recent CT abdomen shows moderate stool in left colon No abdominal mass noted Patulous esophagus and was previously noted on barium swallow from 11/23. Patient reports improvement in symptoms Able to drink some boost and bowel movements have been improved proving. Follow-up in 2 weeks If not significantly improved recommend she follow-up with GI          PDMP reviewed  Return in about 15 days (around 05/15/2022), or if symptoms worsen or fail to improve.  Dana Allan, MD

## 2022-05-03 ENCOUNTER — Ambulatory Visit (INDEPENDENT_AMBULATORY_CARE_PROVIDER_SITE_OTHER): Payer: Medicare Other | Admitting: Podiatry

## 2022-05-03 ENCOUNTER — Encounter: Payer: Self-pay | Admitting: Podiatry

## 2022-05-03 DIAGNOSIS — M79675 Pain in left toe(s): Secondary | ICD-10-CM | POA: Diagnosis not present

## 2022-05-03 DIAGNOSIS — B351 Tinea unguium: Secondary | ICD-10-CM

## 2022-05-03 DIAGNOSIS — M79674 Pain in right toe(s): Secondary | ICD-10-CM

## 2022-05-03 NOTE — Progress Notes (Signed)

## 2022-05-06 ENCOUNTER — Encounter: Payer: Self-pay | Admitting: Family Medicine

## 2022-05-06 DIAGNOSIS — K5900 Constipation, unspecified: Secondary | ICD-10-CM | POA: Insufficient documentation

## 2022-05-06 MED ORDER — PANTOPRAZOLE SODIUM 40 MG PO TBEC: 40.0000 mg | DELAYED_RELEASE_TABLET | Freq: Two times a day (BID) | ORAL | 0 refills | Status: DC

## 2022-05-06 NOTE — Assessment & Plan Note (Signed)
Suspect constipation contributing to feeling fullness. Recent CT abdomen shows moderate stool in left colon No abdominal mass noted Patulous esophagus and was previously noted on barium swallow from 11/23. Patient reports improvement in symptoms Able to drink some boost and bowel movements have been improved proving. Follow-up in 2 weeks If not significantly improved recommend she follow-up with GI

## 2022-05-06 NOTE — Assessment & Plan Note (Signed)
Resolved with 10-day treatment of cefdinir 300 mg twice daily Plan to repeat urine at next visit

## 2022-05-06 NOTE — Assessment & Plan Note (Signed)
Patient noted to have distended stomach, moderate stool and left-sided colonic diverticula on CT.  Patulous esophagus likely secondary to recent paraesophageal hernia repair. No new abdomen on exam.  Right lower quadrant bulge has subsided.  Bowel movements more frequent. Will treat constipation given symptoms have slightly improved. Start MiraLAX daily Start senna-DS daily

## 2022-05-08 ENCOUNTER — Telehealth: Payer: Self-pay | Admitting: Family Medicine

## 2022-05-08 NOTE — Telephone Encounter (Signed)
Copied from CRM [id number]. Topic: Medicare AWV >> May 08, 2022 12:11 PM Rushie Goltz wrote: Reason for CRM: Called patient to schedule Medicare Annual Wellness Visit (AWV). Left message for patient to call back and schedule Medicare Annual Wellness Visit (AWV).  Last date of AWV: 04/17/2021  Please schedule an AWVS appointment at any time with [facility] VISIT.  If any questions, please contact me at [phone].    Thank you,  [facility] Support [facility] Direct dial  [phone]

## 2022-05-10 ENCOUNTER — Telehealth: Payer: Self-pay | Admitting: Family Medicine

## 2022-05-10 NOTE — Telephone Encounter (Signed)
Contacted Wanda Nelson to schedule their annual wellness visit. Appointment made for 05/17/2022.  Thank you,  [facility] Support [facility] Direct dial  [phone]

## 2022-05-11 ENCOUNTER — Encounter: Payer: Self-pay | Admitting: Cardiovascular Disease

## 2022-05-11 ENCOUNTER — Ambulatory Visit: Payer: Medicare Other | Attending: Cardiovascular Disease | Admitting: Cardiovascular Disease

## 2022-05-11 VITALS — BP 120/70 | HR 51 | Ht 61.0 in | Wt 104.4 lb

## 2022-05-11 DIAGNOSIS — I4891 Unspecified atrial fibrillation: Secondary | ICD-10-CM

## 2022-05-11 DIAGNOSIS — E785 Hyperlipidemia, unspecified: Secondary | ICD-10-CM

## 2022-05-11 DIAGNOSIS — R001 Bradycardia, unspecified: Secondary | ICD-10-CM | POA: Diagnosis not present

## 2022-05-11 DIAGNOSIS — I10 Essential (primary) hypertension: Secondary | ICD-10-CM

## 2022-05-11 DIAGNOSIS — I9789 Other postprocedural complications and disorders of the circulatory system, not elsewhere classified: Secondary | ICD-10-CM | POA: Insufficient documentation

## 2022-05-11 DIAGNOSIS — D151 Benign neoplasm of heart: Secondary | ICD-10-CM

## 2022-05-11 NOTE — Patient Instructions (Signed)
Medication Instructions:  No changes *If you need a refill on your cardiac medications before your next appointment, please call your pharmacy*   Lab Work: None ordered If you have labs (blood work) drawn today and your tests are completely normal, you will receive your results only by: MyChart Message (if you have MyChart) OR A paper copy in the mail If you have any lab test that is abnormal or we need to change your treatment, we will call you to review the results.   Testing/Procedures: None ordered   Follow-Up: At [facility], you and your health needs are our priority.  As part of our continuing mission to provide you with exceptional heart care, we have created designated Provider Care Teams.  These Care Teams include your primary Cardiologist (physician) and Advanced Practice Providers (APPs -  Physician Assistants and Nurse Practitioners) who all work together to provide you with the care you need, when you need it.  We recommend signing up for the patient portal called "MyChart".  Sign up information is provided on this After Visit Summary.  MyChart is used to connect with patients for Virtual Visits (Telemedicine).  Patients are able to view lab/test results, encounter notes, upcoming appointments, etc.  Non-urgent messages can be sent to your provider as well.   To learn more about what you can do with MyChart, go to [email or web address].    Your next appointment:   6 month(s)  Provider:   You may see Lorine Bears, MD or one of the following Advanced Practice Providers on your designated Care Team:   Nicolasa Ducking, NP Eula Listen, PA-C Cadence Fransico Michael, PA-C Charlsie Quest, NP {If Card or EP not listed click to update   DO NOT delete brackets or

## 2022-05-11 NOTE — Progress Notes (Signed)
Cardiology Office Note   Date:  05/11/2022   ID:  Wanda Nelson 02-Jul-1931, MRN [id number]  PCP:  Dana Allan, MD  Cardiologist:   Lorine Bears, MD   Chief Complaint  Patient presents with   Follow-up    6 month f/u no complaints today. Meds reviewed verbally with pt.       History of Present Illness: Wanda Nelson is a 89 y.o. female who presents for a follow-up visit post atrial myxoma resection. She has known history of anemia, hiatal hernia, hyperlipidemia and remote history of hemorrhagic stroke.  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  Cardiac catheterization before surgery showed no evidence of obstructive coronary artery disease.  She had intermittent palpitations and tachycardia shortly after surgery due to short runs of nonsustained ventricular tachycardia and intermittent A. fib with RVR. She did not require treatment for this and her symptoms resolved without intervention.  Echocardiogram in December 2020 showed normal LV systolic function with no significant valvular abnormalities.  There was a concern about possible recurrent atrial mass.  However, it appeared to be prominent scarring at the surgical site.  Most recent echocardiogram in June 2021showed no evidence of mass.  EF was 50 to 55%.  He had COVID-19 infection in May of 2022 after a trip to [place].  She she had significant shortness of breath but ultimately recovered.   An echocardiogram was done in July 2022 which showed normal LV systolic function, grade 1 diastolic dysfunction and mild pulmonary hypertension.  There was moderate tricuspid regurgitation.  She was hospitalized last year for shortness of breath. She underwent an echocardiogram which showed normal LV systolic function with mild LVH and stable mild to moderate tricuspid regurgitation.  CT of the chest showed evidence of pulmonary fibrosis.  I felt that her  dyspnea was noncardiac.  She was evaluated by pulmonary and dyspnea was felt to be due to pulmonary fibrosis from large lateral hernia causing chronic reflux.    She underwent hiatal hernia surgery in November.  She had a difficult recovery but overall is doing well.  She is back walking 2 miles per day.  No chest pain or palpitations.  She is mildly bradycardic today.  She reports occasional dizziness in certain positions.  Past Medical History:  Diagnosis Date   Actinic keratosis    Anemia    Aortic atherosclerosis (HCC)    Atrial myxoma 03/17/2018   a.) TTE 03/17/2018 --> 2.4 x 2.5 cm LEFT atrial mass attached at fossa ovalis/atrial septum; pedunculated in shape and solid in texture; b.) s/p atrial myoxma resection 03/21/2018; initially attempted via minimally invasive RIGHT thoracotomy approach, however due to bleeding complications, procedure required median sternotomy and patient going on cardiopulmonary bypass   Basal cell carcinoma 05/17/2021   L upper back 5.0 cm lat to spine - ED&C   Diastolic dysfunction    a.) TTE 03/17/2018: EF 55-60%, LVH, mild PR, G1DD; b.) TTE 07/25/2018: EF 60-65%, RVSP 44.9, mild-mod MR/TR, G1DD; c.) TTE 12/18/2018: EF 60-65%; d.) TTE 06/12/2019: EF 50-55, mild BAE, triv TR, PASP 37.5, G2DD; e.) TTE 07/22/2020: EF 60-65%, mild MR, mod TR, RVSP 42.7, G1DD; f.) TTE 05/08/2021: EF 60-65%, mild LVH, mild-mod TR, AoV sclerosis without stenosis   Dyspnea    Fatigue 12/19/2021   GERD (gastroesophageal reflux disease)    Glaucoma    Hemorrhagic cerebrovascular accident (CVA) (HCC) 1990   Hiatal hernia  Hiatal hernia 05/16/2021   History of 2019 novel coronavirus disease (COVID-19)    a.) 05/30/20 - Tx'd with "covid 19 pill"; b.) 10/2021   History of blood transfusion    History of hiatal hernia    History of left atrial appendage closure 03/21/2018   a.) #45 Atricure   Hyperlipidemia    Hypertension    Hypoxia    Incidental pulmonary nodule, > 3mm and <  8mm 03/20/2018   Noted on CT scan - needs f/u imaging 6 months   Lung fibrosis (HCC) 11/16/2020   NSVT (nonsustained ventricular tachycardia) (HCC) 05/13/2018   a.) holter 05/13/2018 --> 5 short runs   PAF (paroxysmal atrial fibrillation) (HCC) 05/13/2018   a.) holter 05/13/2018 --> intermittent episodes with longest lasting approx 1.5 hours (burden <1%).   Pain due to onychomycosis of toenails of both feet 07/28/2020   Positive self-administered antigen test for COVID-19 10/18/2021   PSVT (paroxysmal supraventricular tachycardia) 05/13/2018   a.) holter 05/13/2018 --> frequent runs of SVT noted during study   Pulmonary fibrosis (HCC)    Squamous cell carcinoma of skin 11/12/2019   L distal dorsum forearm - ED&C    Viral URI 05/31/2020    Past Surgical History:  Procedure Laterality Date   BUNIONECTOMY Bilateral    CLIPPING OF ATRIAL APPENDAGE  03/21/2018   Procedure: Clipping Of Atrial Appendage - using an AtriCure 45mm clip;  Surgeon: Purcell Nails, MD;  Location[facility];  Service: Open Heart Surgery;;   ESOPHAGOGASTRODUODENOSCOPY (EGD) WITH PROPOFOL N/A 09/06/2021   Procedure: ESOPHAGOGASTRODUODENOSCOPY (EGD) WITH PROPOFOL;  Surgeon: Wyline Mood, MD;  Location: [facility] ENDOSCOPY;  Service: Gastroenterology;  Laterality: N/A;   EXCISION OF ATRIAL MYXOMA N/A 03/21/2018   Procedure: Excision Of Atrial Myxoma;  Surgeon: Purcell Nails, MD;  Location: [facility] OR;  Service: Open Heart Surgery;  Laterality: N/A;   RECTOCELE REPAIR     RIGHT/LEFT HEART CATH AND CORONARY ANGIOGRAPHY N/A 03/19/2018   Procedure: RIGHT/LEFT HEART CATH AND CORONARY ANGIOGRAPHY;  Surgeon: Iran Ouch, MD;  Location: [facility] INVASIVE CV LAB;  Service: Cardiovascular;  Laterality: N/A;   TEE WITHOUT CARDIOVERSION N/A 03/21/2018   Procedure: TRANSESOPHAGEAL ECHOCARDIOGRAM (TEE);  Surgeon: Purcell Nails, MD;  Location: [facility] OR;  Service: Open Heart Surgery;  Laterality: N/A;   TONSILLECTOMY     TOTAL ABDOMINAL  HYSTERECTOMY  01/09/1980   XI ROBOTIC ASSISTED PARAESOPHAGEAL HERNIA REPAIR N/A 11/14/2021   Procedure: XI ROBOTIC ASSISTED PARAESOPHAGEAL HERNIA REPAIR, RNFA to assist;  Surgeon: Leafy Ro, MD;  Location: [facility] ORS;  Service: General;  Laterality: N/A;     Current Outpatient Medications  Medication Sig Dispense Refill   acetaminophen (TYLENOL) 325 MG tablet Take 2 tablets (650 mg total) by mouth every 6 (six) hours as needed for mild pain (or Fever >/= 101).     Ascorbic Acid (VITAMIN C PO) Take by mouth daily at 12 noon.     aspirin EC 81 MG tablet Take 1 tablet (81 mg total) by mouth daily. 90 tablet 3   atorvastatin (LIPITOR) 20 MG tablet Take 1 tablet (20 mg total) by mouth daily at 6 PM. 90 tablet 3   Calcium Carbonate-Vitamin D 500-125 MG-UNIT TABS Take 1 tablet by mouth daily.     cholecalciferol (VITAMIN D) 1000 units tablet Take 1,000 Units by mouth daily.     ipratropium-albuterol (DUONEB) 0.5-2.5 (3) MG/3ML SOLN INHALE THE CONTENTS OF ONE (1) VIAL VIA NEBULIZATION EVERY 6 HOURS AS NEEDED 360 mL 0  loratadine (CLARITIN) 10 MG tablet Take 1 tablet (10 mg total) by mouth daily. As needed at night 90 tablet 3   losartan (COZAAR) 25 MG tablet Take 0.5-1 tablets (12.5-25 mg total) by mouth 2 (two) times daily. If BP >140/>80 take losartan 25 mg bid 180 tablet 3   Multiple Vitamin (MULTIVITAMIN) tablet Take 1 tablet by mouth daily.     pantoprazole (PROTONIX) 40 MG tablet Take 1 tablet (40 mg total) by mouth 2 (two) times daily. 180 tablet 0   polyethylene glycol powder (GLYCOLAX/MIRALAX) 17 GM/SCOOP powder Take 17 g by mouth daily. 3350 g 1   sennosides-docusate sodium (SENOKOT-S) 8.6-50 MG tablet Take 1 tablet by mouth daily. 90 tablet 3   No current facility-administered medications for this visit.    Allergies:   Bee venom, Codeine, Morphine and related, Shellfish allergy, Bactrim [sulfamethoxazole-trimethoprim], Norvasc [amlodipine], and Nitrofurantoin    Social History:   The patient  reports that she has never smoked. She has never used smokeless tobacco. She reports that she does not drink alcohol and does not use drugs.   Family History:  The patient's family history includes Cancer in her brother; Colon cancer in her brother, maternal grandmother, and maternal uncle; Coronary artery disease in her brother; Esophageal varices in her brother; Kidney disease in her brother.    ROS:  Please see the history of present illness.   Otherwise, review of systems are positive for none.   All other systems are reviewed and negative.    PHYSICAL EXAM: VS:  BP 120/70 (BP Location: Left Arm, Patient Position: Sitting, Cuff Size: Normal)   Pulse (!) 51   Ht 5\' 1"  (1.549 m)   Wt 104 lb 6 oz (47.3 kg)   LMP  (LMP Unknown)   SpO2 98%   BMI 19.72 kg/m  , BMI Body mass index is 19.72 kg/m. GEN: Well nourished, well developed, in no acute distress  HEENT: normal  Neck: no JVD, carotid bruits, or masses Cardiac: RRR; no murmurs, rubs, or gallops,no edema  Respiratory:  clear to auscultation bilaterally, normal work of breathing GI: soft, nontender, nondistended, + BS MS: no deformity or atrophy  Skin: warm and dry, no rash Neuro:  Strength and sensation are intact Psych: euthymic mood, full affect   EKG:  EKG is ordered today. The ekg ordered today demonstrates sinus bradycardia with short PR interval, low voltage   Recent Labs: 12/11/2021: TSH 1.27 04/11/2022: ALT 13; BUN 11; Creatinine, Ser 0.75; Hemoglobin 14.1; Magnesium 2.0; Platelets 160.0; Potassium 4.0; Sodium 138    Lipid Panel    Component Value Date/Time   CHOL 167 05/16/2021 1042   TRIG 113.0 05/16/2021 1042   HDL 59.80 05/16/2021 1042   CHOLHDL 3 05/16/2021 1042   VLDL 22.6 05/16/2021 1042   LDLCALC 84 05/16/2021 1042      Wt Readings from Last 3 Encounters:  05/11/22 104 lb 6 oz (47.3 kg)  04/30/22 102 lb 8 oz (46.5 kg)  04/11/22 104 lb (47.2 kg)          No data to display             ASSESSMENT AND PLAN:  1.  Status post surgical resection of atrial myxoma: No evidence of recurrent myxoma on most recent echocardiogram from last year.  2.  Postoperative atrial fibrillation: No evidence of recurrent arrhythmia.  She is in sinus rhythm today.  3.  Hyperlipidemia: She is currently on atorvastatin 20 mg daily.  Most recent lipid  profile showed an LDL of 94.  4. Essential hypertension: Blood pressure is controlled on small dose losartan.  5.  Sinus bradycardia: Heart rate is progressively lower than before but still not in a critical range and she has minimal symptoms at this time.  Will continue to monitor for now.    Disposition:   FU with me in 6 months  Signed,  Lorine Bears, MD  05/11/2022 9:15 AM    [facility]

## 2022-05-15 ENCOUNTER — Encounter: Payer: Self-pay | Admitting: Family Medicine

## 2022-05-15 ENCOUNTER — Ambulatory Visit (INDEPENDENT_AMBULATORY_CARE_PROVIDER_SITE_OTHER): Payer: Medicare Other | Admitting: Family Medicine

## 2022-05-15 VITALS — BP 108/68 | HR 68 | Ht 61.0 in | Wt 104.0 lb

## 2022-05-15 DIAGNOSIS — I10 Essential (primary) hypertension: Secondary | ICD-10-CM | POA: Diagnosis not present

## 2022-05-15 DIAGNOSIS — E7800 Pure hypercholesterolemia, unspecified: Secondary | ICD-10-CM

## 2022-05-15 DIAGNOSIS — I700 Atherosclerosis of aorta: Secondary | ICD-10-CM

## 2022-05-15 DIAGNOSIS — K5900 Constipation, unspecified: Secondary | ICD-10-CM | POA: Diagnosis not present

## 2022-05-15 DIAGNOSIS — K219 Gastro-esophageal reflux disease without esophagitis: Secondary | ICD-10-CM | POA: Diagnosis not present

## 2022-05-15 DIAGNOSIS — R634 Abnormal weight loss: Secondary | ICD-10-CM

## 2022-05-15 MED ORDER — POLYETHYLENE GLYCOL 3350 17 GM/SCOOP PO POWD: 8.5000 g | Freq: Every day | ORAL | 1 refills | Status: DC

## 2022-05-15 NOTE — Progress Notes (Signed)
SUBJECTIVE:   Chief Complaint  Patient presents with   Medical Management of Chronic Issues   HPI Patient presents to clinic for follow-up chronic care management.  Hypertension Asymptomatic.  Tolerating losartan 12.5 mg twice daily and tolerating well.  Denies any headaches, visual changes, chest pain, shortness of breath or lower extremity edema.  Hyperlipidemia On Lipitor 20 mg daily and tolerating well.  Denies any myalgias or joint pain  Recent concern for weight loss/constipation Reports doing much better since initiation of Protonix 40 mg twice daily.  Has been swallowing much better.  Has discontinued Senokot.  Is trying to find a good balance between diet and medications.  Endorses eating much better, weight has increased 3 pounds.   PERTINENT PMH / PSH: status post paraesophageal hernia repair Constipation Hypertension Reflux disease Hyperlipidemia  OBJECTIVE:  BP 108/68   Pulse 68   Ht 5\' 1"  (1.549 m)   Wt 104 lb (47.2 kg)   LMP  (LMP Unknown)   SpO2 99%   BMI 19.65 kg/m    Physical Exam Vitals reviewed.  Constitutional:      General: She is not in acute distress.    Appearance: Normal appearance. She is normal weight. She is not ill-appearing, toxic-appearing or diaphoretic.  Eyes:     General:        Right eye: No discharge.        Left eye: No discharge.     Conjunctiva/sclera: Conjunctivae normal.  Cardiovascular:     Rate and Rhythm: Normal rate and regular rhythm.  Pulmonary:     Effort: Pulmonary effort is normal.  Musculoskeletal:        General: Normal range of motion.  Skin:    General: Skin is warm and dry.  Neurological:     Mental Status: She is alert and oriented to person, place, and time. Mental status is at baseline.  Psychiatric:        Attention and Perception: Attention normal.        Mood and Affect: Mood normal.        Speech: Speech normal.        Behavior: Behavior normal. Behavior is cooperative.        Thought  Content: Thought content normal.        Cognition and Memory: Cognition and memory normal.        Judgment: Judgment normal.     ASSESSMENT/PLAN:  Laryngopharyngeal reflux disease Assessment & Plan: Appetite improved since reinitiation of Protonix. Continue Protonix 40 mg twice daily Follow-up as needed.   Constipation, unspecified constipation type Assessment & Plan: Chronic.  Improving. Continues to have some issues with constipation however has improved and does not require use of senna.  Reports MiraLAX 1 scoop causes loose stool. Recommend increasing soluble fiber in diet Can decrease MiraLAX to half scoop every other day Educated to avoid BMs longer than 3 days.   Orders: -     Polyethylene Glycol 3350; Take 8.5 g by mouth daily.  Dispense: 3350 g; Refill: 1  Weight loss, non-intentional Assessment & Plan: Weight improved since initiation of PPI.  Has increased 3 pounds since last visit. Continue PPI Continue boost daily Follow-up as needed    Essential hypertension Assessment & Plan: Chronic.  Stable.  Blood pressure at goal Continue Cozaar 12.5 mg twice daily.    Pure hypercholesterolemia Assessment & Plan: Chronic.  Tolerating statin therapy and denies myalgias Continue Lipitor 20 mg daily Repeat lipid panel at next visit.  Aortic atherosclerosis (HCC) Assessment & Plan: Continue statin therapy    PDMP reviewed  Return in about 7 months (around 12/27/2022) for PCP.  Dana Allan, MD

## 2022-05-15 NOTE — Patient Instructions (Addendum)
It was a pleasure meeting you today. Thank you for allowing me to take part in your health care.  Our goals for today as we discussed include:  Glad you are feeling better. Continue Protonix 40 mg.  Take 1 tablet daily.  If symptoms return increase back to 1 tablet 2 times a day.  Decrease MiraLAX to half scoop daily, if continues to have loose stool take every other day then every third day until stools have formed.  Can continue 1 can boost daily.  Glad your weight has increased.  Your blood pressure is good today.  Continue current blood pressure medication.  I have completed your DO NOT RESUSCITATE order.  Please put this on your fridge at home so that it is visible if EMS you have to come by.  Schedule Medicare Annual Wellness Visit   Please message me 1 month prior to your vacation for antibiotics.  Follow-up as scheduled in December.   If you have any questions or concerns, please do not hesitate to call the office at [phone].  I look forward to our next visit and until then take care and stay safe.  Regards,   Dana Allan, MD   [facility]

## 2022-05-17 ENCOUNTER — Ambulatory Visit (INDEPENDENT_AMBULATORY_CARE_PROVIDER_SITE_OTHER): Payer: Medicare Other

## 2022-05-17 VITALS — Wt 100.0 lb

## 2022-05-17 DIAGNOSIS — Z0000 Encounter for general adult medical examination without abnormal findings: Secondary | ICD-10-CM | POA: Diagnosis not present

## 2022-05-17 NOTE — Progress Notes (Signed)
Subjective:   Wanda Nelson is a 89 y.o. female who presents for Medicare Annual (Subsequent) preventive examination.  Review of Systems    I connected with  Wanda Nelson on 05/17/22 by a audio enabled telemedicine application and verified that I am speaking with the correct person using two identifiers. Patient has fill out some question though her My Chart on 05/15/22.  Patient Location: Home  Provider Location: Home Office  I discussed the limitations of evaluation and management by telemedicine. The patient expressed understanding and agreed to proceed.  Cardiac Risk Factors include: hypertension     Objective:    Today's Vitals   05/17/22 1300  Weight: 100 lb (45.4 kg)   Body mass index is 18.89 kg/m.     05/17/2022    1:07 PM 03/01/2022    9:46 AM 02/16/2022    9:37 AM 11/14/2021   12:20 PM 11/14/2021    6:37 AM 11/03/2021    8:18 AM 09/06/2021   10:42 AM  Advanced Directives  Does Patient Have a Medical Advance Directive? No No Yes Yes Yes Yes Yes  Type of Surveyor, minerals;Living will Healthcare Power of [place];Living will Healthcare Power of [place];Living will  Living will  Does patient want to make changes to medical advance directive?    No - Patient declined No - Patient declined    Copy of Healthcare Power of Attorney in Chart?    Yes - validated most recent copy scanned in chart (See row information) Yes - validated most recent copy scanned in chart (See row information)    Would patient like information on creating a medical advance directive? Yes (MAU/Ambulatory/Procedural Areas - Information given) No - Patient declined         Current Medications (verified) Outpatient Encounter Medications as of 05/17/2022  Medication Sig   acetaminophen (TYLENOL) 325 MG tablet Take 2 tablets (650 mg total) by mouth every 6 (six) hours as needed for mild pain (or Fever >/= 101).   Ascorbic Acid (VITAMIN C PO) Take by mouth  daily at 12 noon.   aspirin EC 81 MG tablet Take 1 tablet (81 mg total) by mouth daily.   atorvastatin (LIPITOR) 20 MG tablet Take 1 tablet (20 mg total) by mouth daily at 6 PM.   Calcium Carbonate-Vitamin D 500-125 MG-UNIT TABS Take 1 tablet by mouth daily.   cholecalciferol (VITAMIN D) 1000 units tablet Take 1,000 Units by mouth daily.   ipratropium-albuterol (DUONEB) 0.5-2.5 (3) MG/3ML SOLN INHALE THE CONTENTS OF ONE (1) VIAL VIA NEBULIZATION EVERY 6 HOURS AS NEEDED   loratadine (CLARITIN) 10 MG tablet Take 1 tablet (10 mg total) by mouth daily. As needed at night   losartan (COZAAR) 25 MG tablet Take 0.5-1 tablets (12.5-25 mg total) by mouth 2 (two) times daily. If BP >140/>80 take losartan 25 mg bid   Multiple Vitamin (MULTIVITAMIN) tablet Take 1 tablet by mouth daily.   pantoprazole (PROTONIX) 40 MG tablet Take 1 tablet (40 mg total) by mouth 2 (two) times daily.   polyethylene glycol powder (GLYCOLAX/MIRALAX) 17 GM/SCOOP powder Take 8.5 g by mouth daily.   No facility-administered encounter medications on file as of 05/17/2022.    Allergies (verified) Bee venom, Codeine, Morphine and related, Shellfish allergy, Bactrim [sulfamethoxazole-trimethoprim], Norvasc [amlodipine], and Nitrofurantoin   History: Past Medical History:  Diagnosis Date   Actinic keratosis    Anemia    Aortic atherosclerosis (HCC)    Atrial myxoma 03/17/2018  a.) TTE 03/17/2018 --> 2.4 x 2.5 cm LEFT atrial mass attached at fossa ovalis/atrial septum; pedunculated in shape and solid in texture; b.) s/p atrial myoxma resection 03/21/2018; initially attempted via minimally invasive RIGHT thoracotomy approach, however due to bleeding complications, procedure required median sternotomy and patient going on cardiopulmonary bypass   Basal cell carcinoma 05/17/2021   L upper back 5.0 cm lat to spine - ED&C   Diastolic dysfunction    a.) TTE 03/17/2018: EF 55-60%, LVH, mild PR, G1DD; b.) TTE 07/25/2018: EF 60-65%, RVSP  44.9, mild-mod MR/TR, G1DD; c.) TTE 12/18/2018: EF 60-65%; d.) TTE 06/12/2019: EF 50-55, mild BAE, triv TR, PASP 37.5, G2DD; e.) TTE 07/22/2020: EF 60-65%, mild MR, mod TR, RVSP 42.7, G1DD; f.) TTE 05/08/2021: EF 60-65%, mild LVH, mild-mod TR, AoV sclerosis without stenosis   Dyspnea    Fatigue 12/19/2021   GERD (gastroesophageal reflux disease)    Glaucoma    Hemorrhagic cerebrovascular accident (CVA) (HCC) 1990   Hiatal hernia    Hiatal hernia 05/16/2021   History of 2019 novel coronavirus disease (COVID-19)    a.) 05/30/20 - Tx'd with "covid 19 pill"; b.) 10/2021   History of blood transfusion    History of hiatal hernia    History of left atrial appendage closure 03/21/2018   a.) #45 Atricure   Hyperlipidemia    Hypertension    Hypoxia    Incidental pulmonary nodule, > 3mm and < 8mm 03/20/2018   Noted on CT scan - needs f/u imaging 6 months   Lung fibrosis (HCC) 11/16/2020   NSVT (nonsustained ventricular tachycardia) (HCC) 05/13/2018   a.) holter 05/13/2018 --> 5 short runs   PAF (paroxysmal atrial fibrillation) (HCC) 05/13/2018   a.) holter 05/13/2018 --> intermittent episodes with longest lasting approx 1.5 hours (burden <1%).   Pain due to onychomycosis of toenails of both feet 07/28/2020   Positive self-administered antigen test for COVID-19 10/18/2021   PSVT (paroxysmal supraventricular tachycardia) 05/13/2018   a.) holter 05/13/2018 --> frequent runs of SVT noted during study   Pulmonary fibrosis (HCC)    Squamous cell carcinoma of skin 11/12/2019   L distal dorsum forearm - ED&C    Viral URI 05/31/2020   Past Surgical History:  Procedure Laterality Date   BUNIONECTOMY Bilateral    CLIPPING OF ATRIAL APPENDAGE  03/21/2018   Procedure: Clipping Of Atrial Appendage - using an AtriCure 45mm clip;  Surgeon: Purcell Nails, MD;  Location: [facility] OR;  Service: Open Heart Surgery;;   ESOPHAGOGASTRODUODENOSCOPY (EGD) WITH PROPOFOL N/A 09/06/2021   Procedure:  ESOPHAGOGASTRODUODENOSCOPY (EGD) WITH PROPOFOL;  Surgeon: Wyline Mood, MD;  Location[facility] ENDOSCOPY;  Service: Gastroenterology;  Laterality: N/A;   EXCISION OF ATRIAL MYXOMA N/A 03/21/2018   Procedure: Excision Of Atrial Myxoma;  Surgeon: Purcell Nails, MD;  Location: [facility] OR;  Service: Open Heart Surgery;  Laterality: N/A;   RECTOCELE REPAIR     RIGHT/LEFT HEART CATH AND CORONARY ANGIOGRAPHY N/A 03/19/2018   Procedure: RIGHT/LEFT HEART CATH AND CORONARY ANGIOGRAPHY;  Surgeon: Iran Ouch, MD;  Location: [facility] INVASIVE CV LAB;  Service: Cardiovascular;  Laterality: N/A;   TEE WITHOUT CARDIOVERSION N/A 03/21/2018   Procedure: TRANSESOPHAGEAL ECHOCARDIOGRAM (TEE);  Surgeon: Purcell Nails, MD;  Location: [facility] OR;  Service: Open Heart Surgery;  Laterality: N/A;   TONSILLECTOMY     TOTAL ABDOMINAL HYSTERECTOMY  01/09/1980   XI ROBOTIC ASSISTED PARAESOPHAGEAL HERNIA REPAIR N/A 11/14/2021   Procedure: XI ROBOTIC ASSISTED PARAESOPHAGEAL HERNIA REPAIR, RNFA to assist;  Surgeon: Leafy Ro, MD;  Location: [facility] ORS;  Service: General;  Laterality: N/A;   Family History  Problem Relation Age of Onset   Colon cancer Brother    Cancer Brother        neck tumor cancerous    Esophageal varices Brother    Colon cancer Maternal Uncle    Colon cancer Maternal Grandmother    Kidney disease Brother    Coronary artery disease Brother        with stent, cabg   Breast cancer Neg Hx    Social History   Socioeconomic History   Marital status: Widowed    Spouse name: Not on file   Number of children: Not on file   Years of education: Not on file   Highest education level: Bachelor's degree (e.g., BA, AB, BS)  Occupational History   Not on file  Tobacco Use   Smoking status: Never   Smokeless tobacco: Never  Vaping Use   Vaping Use: Never used  Substance and Sexual Activity   Alcohol use: No    Alcohol/week: 0.0 standard drinks of alcohol   Drug use: No   Sexual activity: Not Currently   Other Topics Concern   Not on file  Social History Narrative   [place],  husband died December 04, 2018 due to brain hemorrhage   Daughter in law Lamyiah Omoto    widowed   Social Determinants of Health   Financial Resource Strain: Low Risk  (05/17/2022)   Overall Financial Resource Strain (CARDIA)    Difficulty of Paying Living Expenses: Not hard at all  Food Insecurity: No Food Insecurity (05/17/2022)   Hunger Vital Sign    Worried About Running Out of Food in the Last Year: Never true    Ran Out of Food in the Last Year: Never true  Transportation Needs: No Transportation Needs (05/17/2022)   PRAPARE - Administrator, Civil Service (Medical): No    Lack of Transportation (Non-Medical): No  Physical Activity: Sufficiently Active (05/17/2022)   Exercise Vital Sign    Days of Exercise per Week: 7 days    Minutes of Exercise per Session: 30 min  Stress: No Stress Concern Present (05/17/2022)   [employer] of Occupational Health - Occupational Stress Questionnaire    Feeling of Stress : Not at all  Social Connections: Socially Integrated (05/17/2022)   Social Connection and Isolation Panel [NHANES]    Frequency of Communication with Friends and Family: More than three times a week    Frequency of Social Gatherings with Friends and Family: More than three times a week    Attends Religious Services: More than 4 times per year    Active Member of [employer] or Organizations: Yes    Attends Engineer, structural: More than 4 times per year    Marital Status: Married    Tobacco Counseling Counseling given: Yes   Clinical Intake:  Pre-visit preparation completed: Yes  Pain : No/denies pain     BMI - recorded: 18.9 Nutritional Status: BMI <19  Underweight Nutritional Risks: None Diabetes: No  How often do you need to have someone help you when you read instructions, pamphlets, or other written materials from your doctor or pharmacy?: 1 -  Never  Diabetic?no  Interpreter Needed?: No  Information entered by :: Fredirick Maudlin   Activities of Daily Living    05/13/2022   11:50 AM 11/14/2021   12:20 PM  In your present state of health,  do you have any difficulty performing the following activities:  Hearing? 1 0  Vision? 0 0  Difficulty concentrating or making decisions? 0 0  Walking or climbing stairs? 0 0  Dressing or bathing? 0 0  Doing errands, shopping? 0 0  Preparing Food and eating ? N   Using the Toilet? N   In the past six months, have you accidently leaked urine? Y   Do you have problems with loss of bowel control? Y   Managing your Finances? N   Housekeeping or managing your Housekeeping? N     Patient Care Team: Dana Allan, MD as PCP - General (Family Medicine) Iran Ouch, MD as PCP - Cardiology (Cardiology) Vida Rigger, MD as Consulting Physician (Pulmonary Disease)  Indicate any recent Medical Services you may have received from other than [facility] providers in the past year (date may be approximate).     Assessment:   This is a routine wellness examination for [place].  Hearing/Vision screen Hearing Screening - Comments:: Has tumor in left ear and use a hearing aide Vision Screening - Comments:: Wears rx glasses - up to date with routine eye exams with  Dr Clydene Pugh  Dietary issues and exercise activities discussed: Current Exercise Habits: Home exercise routine;Structured exercise class, Type of exercise: walking, Time (Minutes): 40, Frequency (Times/Week): 2, Weekly Exercise (Minutes/Week): 80, Intensity: Moderate   Goals Addressed               This Visit's Progress     Patient Stated     I want to maintain my weight (pt-stated)   On track     Stay hydrated and drink plenty of water      Other     Patient Stated        Stay active and walk 2 miles every day      Depression Screen    05/15/2022    8:38 AM 04/30/2022    4:02 PM 04/11/2022   10:09 AM 01/04/2022     3:27 PM 01/04/2022    2:12 PM 12/15/2021   12:24 PM 12/11/2021   11:06 AM  PHQ 2/9 Scores  PHQ - 2 Score 0 0 0 0 0 0 0  PHQ- 9 Score 0 0  4 0      Fall Risk    05/15/2022    8:38 AM 05/13/2022   11:50 AM 04/30/2022    4:02 PM 04/11/2022   10:09 AM 01/04/2022    3:27 PM  Fall Risk   Falls in the past year? 1 1 1 1 1   Number falls in past yr: 1 0 0 0 1  Injury with Fall? 0 0 0 0 0  Risk for fall due to : No Fall Risks Impaired balance/gait No Fall Risks No Fall Risks History of fall(s)  Follow up  Falls evaluation completed;Falls prevention discussed Falls evaluation completed Falls evaluation completed Falls evaluation completed    FALL RISK PREVENTION PERTAINING TO THE HOME:  Any stairs in or around the home? No  If so, are there any without handrails? No  Home free of loose throw rugs in walkways, pet beds, electrical cords, etc? No  Adequate lighting in your home to reduce risk of falls? Yes   ASSISTIVE DEVICES UTILIZED TO PREVENT FALLS:  Life alert? No  Use of a cane, walker or w/c? No  Grab bars in the bathroom? Yes  Shower chair or bench in shower? Yes  Elevated toilet seat or a handicapped toilet?  Yes   TIMED UP AND GO:  Was the test performed? No . Televisit  Cognitive Function:        05/17/2022    1:05 PM 04/14/2019    9:49 AM  6CIT Screen  What Year? 0 points 0 points  What month? 0 points 0 points  What time? 0 points 0 points  Count back from 20 0 points 0 points  Months in reverse 0 points 0 points  Repeat phrase 0 points 0 points  Total Score 0 points 0 points    Immunizations Immunization History  Administered Date(s) Administered   Influenza, High Dose Seasonal PF 10/27/2019, 10/20/2020   Influenza,inj,Quad PF,6+ Mos 09/22/2015   Influenza-Unspecified 10/08/2017, 10/27/2018, 10/24/2021   Moderna Sars-Covid-2 Vaccination 01/23/2019, 02/20/2019, 11/24/2019, 05/07/2020   PNEUMOCOCCAL CONJUGATE-20 01/04/2022   Pfizer Covid-19 Vaccine Bivalent  Booster 99yrs & up 09/29/2020   Pneumococcal Conjugate-13 03/24/2014   Pneumococcal-Unspecified 11/17/2014   Respiratory Syncytial Virus Vaccine,Recomb Aduvanted(Arexvy) 10/31/2021   Tdap 05/02/2020   Zoster Recombinat (Shingrix) 09/30/2017, 12/23/2017    TDAP status: Up to date  Flu Vaccine status: Up to date  Pneumococcal vaccine status: Up to date  Covid-19 vaccine status: Completed vaccines  Qualifies for Shingles Vaccine? Yes   Zostavax completed Yes   Shingrix Completed?: Yes  Screening Tests Health Maintenance  Topic Date Due   COVID-19 Vaccine (6 - 2023-24 season) 05/31/2023 (Originally 09/08/2021)   INFLUENZA VACCINE  08/09/2022   Medicare Annual Wellness (AWV)  05/17/2023   DTaP/Tdap/Td (2 - Td or Tdap) 05/03/2030   Pneumonia Vaccine 63+ Years old  Completed   Zoster Vaccines- Shingrix  Completed   DEXA SCAN  Addressed   HPV VACCINES  Aged Out    Health Maintenance  There are no preventive care reminders to display for this patient.   Colorectal cancer screening: Type of screening: Colonoscopy. Completed 2014. Repeat every no longer need due to age years  Mammogram status: Completed 04/06/22. Repeat every year   Bone Density status: Completed 2015. Results reflect: Bone density results: NORMAL. Repeat every no longer need due to age  years.  Lung Cancer Screening: (Low Dose CT Chest recommended if Age 15-80 years, 30 pack-year currently smoking OR have quit w/in 15years.) does not qualify.     Additional Screening:  Hepatitis C Screening: does not qualify  Vision Screening: Recommended annual ophthalmology exams for early detection of glaucoma and other disorders of the eye. Is the patient up to date with their annual eye exam?  Yes  Who is the provider or what is the name of the office in which the patient attends annual eye exams? Dr Clydene Pugh  If pt is not established with a provider, would they like to be referred to a provider to establish care? No .    Dental Screening: Recommended annual dental exams for proper oral hygiene  Community Resource Referral / Chronic Care Management: CRR required this visit?  No   CCM required this visit?  No      Plan:     I have personally reviewed and noted the following in the patient's chart:   Medical and social history Use of alcohol, tobacco or illicit drugs  Current medications and supplements including opioid prescriptions. Patient is not currently taking opioid prescriptions. Functional ability and status Nutritional status Physical activity Advanced directives List of other physicians Hospitalizations, surgeries, and ER visits in previous 12 months Vitals Screenings to include cognitive, depression, and falls Referrals and appointments  In addition, I have  reviewed and discussed with patient certain preventive protocols, quality metrics, and best practice recommendations. A written personalized care plan for preventive services as well as general preventive health recommendations were provided to patient.     Annabell Sabal, CMA   05/17/2022   Nurse Notes: none

## 2022-05-17 NOTE — Patient Instructions (Signed)
Wanda Nelson , Thank you for taking time to come for your Medicare Wellness Visit. I appreciate your ongoing commitment to your health goals. Please review the following plan we discussed and let me know if I can assist you in the future.   These are the goals we discussed:  Goals       Patient Stated     I want to maintain my weight (pt-stated)      Stay hydrated and drink plenty of water      Other     Patient Stated      Stay active and walk 2 miles every day        This is a list of the screening recommended for you and due dates:  Health Maintenance  Topic Date Due   COVID-19 Vaccine (6 - 2023-24 season) 05/31/2023*   Flu Shot  08/09/2022   Medicare Annual Wellness Visit  05/17/2023   DTaP/Tdap/Td vaccine (2 - Td or Tdap) 05/03/2030   Pneumonia Vaccine  Completed   Zoster (Shingles) Vaccine  Completed   DEXA scan (bone density measurement)  Addressed   HPV Vaccine  Aged Out  *Topic was postponed. The date shown is not the original due date.    Advanced directives:Patient has an update copy of ADV at the office and scanned in patient chart.   Conditions/risks identified: Aim for 30 minutes of exercise or brisk walking, 6-8 glasses of water, and 5 servings of fruits and vegetables each day.   Next appointment: Follow up in one year for your annual wellness visit    Preventive Care 65 Years and Older, Female Preventive care refers to lifestyle choices and visits with your health care provider that can promote health and wellness. What does preventive care include? A yearly physical exam. This is also called an annual well check. Dental exams once or twice a year. Routine eye exams. Ask your health care provider how often you should have your eyes checked. Personal lifestyle choices, including: Daily care of your teeth and gums. Regular physical activity. Eating a healthy diet. Avoiding tobacco and drug use. Limiting alcohol use. Practicing safe sex. Taking  low-dose aspirin every day. Taking vitamin and mineral supplements as recommended by your health care provider. What happens during an annual well check? The services and screenings done by your health care provider during your annual well check will depend on your age, overall health, lifestyle risk factors, and family history of disease. Counseling  Your health care provider may ask you questions about your: Alcohol use. Tobacco use. Drug use. Emotional well-being. Home and relationship well-being. Sexual activity. Eating habits. History of falls. Memory and ability to understand (cognition). Work and work Astronomer. Reproductive health. Screening  You may have the following tests or measurements: Height, weight, and BMI. Blood pressure. Lipid and cholesterol levels. These may be checked every 5 years, or more frequently if you are over 80 years old. Skin check. Lung cancer screening. You may have this screening every year starting at age 37 if you have a 30-pack-year history of smoking and currently smoke or have quit within the past 15 years. Fecal occult blood test (FOBT) of the stool. You may have this test every year starting at age 86. Flexible sigmoidoscopy or colonoscopy. You may have a sigmoidoscopy every 5 years or a colonoscopy every 10 years starting at age 32. Hepatitis C blood test. Hepatitis B blood test. Sexually transmitted disease (STD) testing. Diabetes screening. This is done by checking your  blood sugar (glucose) after you have not eaten for a while (fasting). You may have this done every 1-3 years. Bone density scan. This is done to screen for osteoporosis. You may have this done starting at age 46. Mammogram. This may be done every 1-2 years. Talk to your health care provider about how often you should have regular mammograms. Talk with your health care provider about your test results, treatment options, and if necessary, the need for more tests. Vaccines   Your health care provider may recommend certain vaccines, such as: Influenza vaccine. This is recommended every year. Tetanus, diphtheria, and acellular pertussis (Tdap, Td) vaccine. You may need a Td booster every 10 years. Zoster vaccine. You may need this after age 79. Pneumococcal 13-valent conjugate (PCV13) vaccine. One dose is recommended after age 11. Pneumococcal polysaccharide (PPSV23) vaccine. One dose is recommended after age 75. Talk to your health care provider about which screenings and vaccines you need and how often you need them. This information is not intended to replace advice given to you by your health care provider. Make sure you discuss any questions you have with your health care provider. Document Released: 01/21/2015 Document Revised: 09/14/2015 Document Reviewed: 10/26/2014 Elsevier Interactive Patient Education  2017 [place] Prevention in the Home Falls can cause injuries. They can happen to people of all ages. There are many things you can do to make your home safe and to help prevent falls. What can I do on the outside of my home? Regularly fix the edges of walkways and driveways and fix any cracks. Remove anything that might make you trip as you walk through a door, such as a raised step or threshold. Trim any bushes or trees on the path to your home. Use bright outdoor lighting. Clear any walking paths of anything that might make someone trip, such as rocks or tools. Regularly check to see if handrails are loose or broken. Make sure that both sides of any steps have handrails. Any raised decks and porches should have guardrails on the edges. Have any leaves, snow, or ice cleared regularly. Use sand or salt on walking paths during winter. Clean up any spills in your garage right away. This includes oil or grease spills. What can I do in the bathroom? Use night lights. Install grab bars by the toilet and in the tub and shower. Do not use towel  bars as grab bars. Use non-skid mats or decals in the tub or shower. If you need to sit down in the shower, use a plastic, non-slip stool. Keep the floor dry. Clean up any water that spills on the floor as soon as it happens. Remove soap buildup in the tub or shower regularly. Attach bath mats securely with double-sided non-slip rug tape. Do not have throw rugs and other things on the floor that can make you trip. What can I do in the bedroom? Use night lights. Make sure that you have a light by your bed that is easy to reach. Do not use any sheets or blankets that are too big for your bed. They should not hang down onto the floor. Have a firm chair that has side arms. You can use this for support while you get dressed. Do not have throw rugs and other things on the floor that can make you trip. What can I do in the kitchen? Clean up any spills right away. Avoid walking on wet floors. Keep items that you use a lot in  easy-to-reach places. If you need to reach something above you, use a strong step stool that has a grab bar. Keep electrical cords out of the way. Do not use floor polish or wax that makes floors slippery. If you must use wax, use non-skid floor wax. Do not have throw rugs and other things on the floor that can make you trip. What can I do with my stairs? Do not leave any items on the stairs. Make sure that there are handrails on both sides of the stairs and use them. Fix handrails that are broken or loose. Make sure that handrails are as long as the stairways. Check any carpeting to make sure that it is firmly attached to the stairs. Fix any carpet that is loose or worn. Avoid having throw rugs at the top or bottom of the stairs. If you do have throw rugs, attach them to the floor with carpet tape. Make sure that you have a light switch at the top of the stairs and the bottom of the stairs. If you do not have them, ask someone to add them for you. What else can I do to help  prevent falls? Wear shoes that: Do not have high heels. Have rubber bottoms. Are comfortable and fit you well. Are closed at the toe. Do not wear sandals. If you use a stepladder: Make sure that it is fully opened. Do not climb a closed stepladder. Make sure that both sides of the stepladder are locked into place. Ask someone to hold it for you, if possible. Clearly mark and make sure that you can see: Any grab bars or handrails. First and last steps. Where the edge of each step is. Use tools that help you move around (mobility aids) if they are needed. These include: Canes. Walkers. Scooters. Crutches. Turn on the lights when you go into a dark area. Replace any light bulbs as soon as they burn out. Set up your furniture so you have a clear path. Avoid moving your furniture around. If any of your floors are uneven, fix them. If there are any pets around you, be aware of where they are. Review your medicines with your doctor. Some medicines can make you feel dizzy. This can increase your chance of falling. Ask your doctor what other things that you can do to help prevent falls. This information is not intended to replace advice given to you by your health care provider. Make sure you discuss any questions you have with your health care provider. Document Released: 10/21/2008 Document Revised: 06/02/2015 Document Reviewed: 01/29/2014 Elsevier Interactive Patient Education  2017 [employer].

## 2022-05-27 NOTE — Assessment & Plan Note (Signed)
Appetite improved since reinitiation of Protonix. Continue Protonix 40 mg twice daily Follow-up as needed.

## 2022-05-27 NOTE — Assessment & Plan Note (Signed)
Chronic.  Tolerating statin therapy and denies myalgias Continue Lipitor 20 mg daily Repeat lipid panel at next visit.

## 2022-05-27 NOTE — Assessment & Plan Note (Signed)
Weight improved since initiation of PPI.  Has increased 3 pounds since last visit. Continue PPI Continue boost daily Follow-up as needed

## 2022-05-27 NOTE — Assessment & Plan Note (Signed)
Chronic.  Stable.  Blood pressure at goal Continue Cozaar 12.5 mg twice daily.

## 2022-05-27 NOTE — Assessment & Plan Note (Signed)
Continue statin therapy.

## 2022-05-27 NOTE — Assessment & Plan Note (Signed)
Chronic.  Improving. Continues to have some issues with constipation however has improved and does not require use of senna.  Reports MiraLAX 1 scoop causes loose stool. Recommend increasing soluble fiber in diet Can decrease MiraLAX to half scoop every other day Educated to avoid BMs longer than 3 days.

## 2022-07-10 DIAGNOSIS — I10 Essential (primary) hypertension: Secondary | ICD-10-CM | POA: Diagnosis not present

## 2022-07-10 DIAGNOSIS — R0600 Dyspnea, unspecified: Secondary | ICD-10-CM | POA: Diagnosis not present

## 2022-07-10 DIAGNOSIS — K219 Gastro-esophageal reflux disease without esophagitis: Secondary | ICD-10-CM | POA: Diagnosis not present

## 2022-07-16 DIAGNOSIS — H401123 Primary open-angle glaucoma, left eye, severe stage: Secondary | ICD-10-CM | POA: Diagnosis not present

## 2022-07-16 DIAGNOSIS — H0288A Meibomian gland dysfunction right eye, upper and lower eyelids: Secondary | ICD-10-CM | POA: Diagnosis not present

## 2022-07-16 DIAGNOSIS — H401112 Primary open-angle glaucoma, right eye, moderate stage: Secondary | ICD-10-CM | POA: Diagnosis not present

## 2022-07-16 DIAGNOSIS — H0288B Meibomian gland dysfunction left eye, upper and lower eyelids: Secondary | ICD-10-CM | POA: Diagnosis not present

## 2022-07-16 DIAGNOSIS — H16223 Keratoconjunctivitis sicca, not specified as Sjogren's, bilateral: Secondary | ICD-10-CM | POA: Diagnosis not present

## 2022-07-16 DIAGNOSIS — Z961 Presence of intraocular lens: Secondary | ICD-10-CM | POA: Diagnosis not present

## 2022-08-03 ENCOUNTER — Ambulatory Visit (INDEPENDENT_AMBULATORY_CARE_PROVIDER_SITE_OTHER): Payer: Medicare Other | Admitting: Podiatry

## 2022-08-03 DIAGNOSIS — M79675 Pain in left toe(s): Secondary | ICD-10-CM | POA: Diagnosis not present

## 2022-08-03 DIAGNOSIS — M79674 Pain in right toe(s): Secondary | ICD-10-CM | POA: Diagnosis not present

## 2022-08-03 DIAGNOSIS — B351 Tinea unguium: Secondary | ICD-10-CM | POA: Diagnosis not present

## 2022-08-03 NOTE — Progress Notes (Signed)
  Subjective:  Patient ID: Wanda Nelson, female    DOB: 17-Jun-1931,  MRN: [id number]  Wanda Nelson presents to clinic today for: painful elongated mycotic toenails 1-5 bilaterally which are tender when wearing enclosed shoe gear. Pain is relieved with periodic professional debridement.  Chief Complaint  Patient presents with   Nail Problem    RFC,Referring Provider Dana Allan, MD,lov:05/24        PCP is Dana Allan, MD.  Allergies  Allergen Reactions   Bee Venom Anaphylaxis and Hives   Codeine Other (See Comments)    Altered mental status Altered mental status   Morphine And Codeine     Altered mental status   Shellfish Allergy Anaphylaxis and Hives    Mainly shrimp   Bactrim [Sulfamethoxazole-Trimethoprim]     ? Reaction     Norvasc [Amlodipine]     2.5 leg edema    Nitrofurantoin Rash    Review of Systems: Negative except as noted in the HPI.  Objective: No changes noted in today's physical examination. There were no vitals filed for this visit.  Wanda Nelson is a pleasant 89 y.o. female in NAD. AAO x 3.  Vascular Examination: Capillary refill time <3 seconds b/l LE. Palpable pedal pulses b/l LE. Digital hair present b/l. No pedal edema b/l. Skin temperature gradient WNL b/l. No cyanosis or clubbing noted b/l LE.Marland Kitchen  Dermatological Examination: Pedal skin with normal turgor, texture and tone b/l. No open wounds. No interdigital macerations b/l. Toenails 1-5 b/l thickened, discolored, dystrophic with subungual debris. There is pain on palpation to dorsal aspect of nailplates. No corns, calluses nor porokeratotic lesions noted..  Neurological Examination: Protective sensation intact with 10 gram monofilament b/l LE. Vibratory sensation intact b/l LE.   Musculoskeletal Examination: Normal muscle strength 5/5 to all lower extremity muscle groups bilaterally. No pain, crepitus or joint limitation noted with ROM b/l LE. No gross bony pedal  deformities b/l. Patient ambulates independently without assistive aids.  Assessment/Plan: 1. Pain due to onychomycosis of toenails of both feet    Patient was evaluated and treated. All patient's and/or POA's questions/concerns addressed on today's visit. Toenails 1-5 debrided in length and girth without incident. Continue soft, supportive shoe gear daily. Report any pedal injuries to medical professional. Call office if there are any questions/concerns. -Patient/POA to call should there be question/concern in the interim.   Return in about 3 months (around 11/03/2022).  Freddie Breech, DPM

## 2022-08-11 ENCOUNTER — Encounter: Payer: Self-pay | Admitting: Podiatry

## 2022-08-17 ENCOUNTER — Other Ambulatory Visit: Payer: Self-pay

## 2022-08-17 ENCOUNTER — Inpatient Hospital Stay
Admission: EM | Admit: 2022-08-17 | Discharge: 2022-08-20 | DRG: 357 | Disposition: A | Discharge status: Home / Self Care | Payer: Medicare Other | Attending: Internal Medicine | Admitting: Internal Medicine

## 2022-08-17 ENCOUNTER — Emergency Department: Payer: Medicare Other

## 2022-08-17 ENCOUNTER — Encounter
Admission: EM | Disposition: A | Discharge status: Home / Self Care | Payer: Self-pay | Source: Home / Self Care | Attending: Internal Medicine

## 2022-08-17 DIAGNOSIS — Z90710 Acquired absence of both cervix and uterus: Secondary | ICD-10-CM

## 2022-08-17 DIAGNOSIS — Z800 Family history of malignant neoplasm of digestive organs: Secondary | ICD-10-CM | POA: Diagnosis not present

## 2022-08-17 DIAGNOSIS — R54 Age-related physical debility: Secondary | ICD-10-CM | POA: Diagnosis present

## 2022-08-17 DIAGNOSIS — R109 Unspecified abdominal pain: Secondary | ICD-10-CM | POA: Diagnosis not present

## 2022-08-17 DIAGNOSIS — K5792 Diverticulitis of intestine, part unspecified, without perforation or abscess without bleeding: Secondary | ICD-10-CM | POA: Diagnosis not present

## 2022-08-17 DIAGNOSIS — I2510 Atherosclerotic heart disease of native coronary artery without angina pectoris: Secondary | ICD-10-CM | POA: Diagnosis present

## 2022-08-17 DIAGNOSIS — K5733 Diverticulitis of large intestine without perforation or abscess with bleeding: Secondary | ICD-10-CM | POA: Diagnosis present

## 2022-08-17 DIAGNOSIS — Z885 Allergy status to narcotic agent status: Secondary | ICD-10-CM

## 2022-08-17 DIAGNOSIS — Z8673 Personal history of transient ischemic attack (TIA), and cerebral infarction without residual deficits: Secondary | ICD-10-CM | POA: Diagnosis not present

## 2022-08-17 DIAGNOSIS — Z882 Allergy status to sulfonamides status: Secondary | ICD-10-CM

## 2022-08-17 DIAGNOSIS — J8410 Pulmonary fibrosis, unspecified: Secondary | ICD-10-CM | POA: Diagnosis not present

## 2022-08-17 DIAGNOSIS — E785 Hyperlipidemia, unspecified: Secondary | ICD-10-CM | POA: Diagnosis present

## 2022-08-17 DIAGNOSIS — D62 Acute posthemorrhagic anemia: Secondary | ICD-10-CM | POA: Diagnosis present

## 2022-08-17 DIAGNOSIS — Z841 Family history of disorders of kidney and ureter: Secondary | ICD-10-CM | POA: Diagnosis not present

## 2022-08-17 DIAGNOSIS — D500 Iron deficiency anemia secondary to blood loss (chronic): Secondary | ICD-10-CM

## 2022-08-17 DIAGNOSIS — K219 Gastro-esophageal reflux disease without esophagitis: Secondary | ICD-10-CM | POA: Diagnosis present

## 2022-08-17 DIAGNOSIS — E46 Unspecified protein-calorie malnutrition: Secondary | ICD-10-CM | POA: Diagnosis present

## 2022-08-17 DIAGNOSIS — Z8249 Family history of ischemic heart disease and other diseases of the circulatory system: Secondary | ICD-10-CM

## 2022-08-17 DIAGNOSIS — Z85828 Personal history of other malignant neoplasm of skin: Secondary | ICD-10-CM

## 2022-08-17 DIAGNOSIS — K55059 Acute (reversible) ischemia of intestine, part and extent unspecified: Secondary | ICD-10-CM | POA: Diagnosis not present

## 2022-08-17 DIAGNOSIS — R0989 Other specified symptoms and signs involving the circulatory and respiratory systems: Secondary | ICD-10-CM | POA: Diagnosis not present

## 2022-08-17 DIAGNOSIS — K449 Diaphragmatic hernia without obstruction or gangrene: Secondary | ICD-10-CM | POA: Diagnosis not present

## 2022-08-17 DIAGNOSIS — K922 Gastrointestinal hemorrhage, unspecified: Secondary | ICD-10-CM

## 2022-08-17 DIAGNOSIS — J449 Chronic obstructive pulmonary disease, unspecified: Secondary | ICD-10-CM | POA: Diagnosis not present

## 2022-08-17 DIAGNOSIS — K5730 Diverticulosis of large intestine without perforation or abscess without bleeding: Secondary | ICD-10-CM | POA: Diagnosis not present

## 2022-08-17 DIAGNOSIS — I700 Atherosclerosis of aorta: Secondary | ICD-10-CM | POA: Diagnosis present

## 2022-08-17 DIAGNOSIS — R918 Other nonspecific abnormal finding of lung field: Secondary | ICD-10-CM | POA: Diagnosis not present

## 2022-08-17 DIAGNOSIS — I480 Paroxysmal atrial fibrillation: Secondary | ICD-10-CM | POA: Diagnosis present

## 2022-08-17 DIAGNOSIS — K7689 Other specified diseases of liver: Secondary | ICD-10-CM | POA: Diagnosis not present

## 2022-08-17 DIAGNOSIS — K5793 Diverticulitis of intestine, part unspecified, without perforation or abscess with bleeding: Secondary | ICD-10-CM | POA: Diagnosis not present

## 2022-08-17 DIAGNOSIS — Z91013 Allergy to seafood: Secondary | ICD-10-CM | POA: Diagnosis not present

## 2022-08-17 DIAGNOSIS — Z79899 Other long term (current) drug therapy: Secondary | ICD-10-CM

## 2022-08-17 DIAGNOSIS — K5721 Diverticulitis of large intestine with perforation and abscess with bleeding: Secondary | ICD-10-CM | POA: Diagnosis not present

## 2022-08-17 DIAGNOSIS — Z8616 Personal history of COVID-19: Secondary | ICD-10-CM | POA: Diagnosis not present

## 2022-08-17 DIAGNOSIS — Z7982 Long term (current) use of aspirin: Secondary | ICD-10-CM | POA: Diagnosis not present

## 2022-08-17 DIAGNOSIS — Z91030 Bee allergy status: Secondary | ICD-10-CM | POA: Diagnosis not present

## 2022-08-17 DIAGNOSIS — Z87442 Personal history of urinary calculi: Secondary | ICD-10-CM

## 2022-08-17 DIAGNOSIS — E7800 Pure hypercholesterolemia, unspecified: Secondary | ICD-10-CM | POA: Diagnosis not present

## 2022-08-17 DIAGNOSIS — K5732 Diverticulitis of large intestine without perforation or abscess without bleeding: Secondary | ICD-10-CM | POA: Insufficient documentation

## 2022-08-17 DIAGNOSIS — Z681 Body mass index (BMI) 19 or less, adult: Secondary | ICD-10-CM | POA: Diagnosis not present

## 2022-08-17 DIAGNOSIS — I10 Essential (primary) hypertension: Secondary | ICD-10-CM | POA: Diagnosis present

## 2022-08-17 DIAGNOSIS — R0602 Shortness of breath: Secondary | ICD-10-CM | POA: Diagnosis not present

## 2022-08-17 HISTORY — PX: VISCERAL ANGIOGRAPHY: CATH118276

## 2022-08-17 HISTORY — DX: Diverticulitis of intestine, part unspecified, without perforation or abscess without bleeding: K57.92

## 2022-08-17 HISTORY — DX: Gastrointestinal hemorrhage, unspecified: K92.2

## 2022-08-17 LAB — COMPREHENSIVE METABOLIC PANEL
ALT: 15 U/L (ref 0–44)
AST: 20 U/L (ref 15–41)
Albumin: 3.7 g/dL (ref 3.5–5.0)
Alkaline Phosphatase: 73 U/L (ref 38–126)
Anion gap: 10 (ref 5–15)
BUN: 10 mg/dL (ref 8–23)
CO2: 24 mmol/L (ref 22–32)
Calcium: 9.3 mg/dL (ref 8.9–10.3)
Chloride: 104 mmol/L (ref 98–111)
Creatinine, Ser: 0.79 mg/dL (ref 0.44–1.00)
GFR, Estimated: >60 mL/min (ref >60)
Glucose, Bld: 94 mg/dL (ref 70–99)
Potassium: 3.7 mmol/L (ref 3.5–5.1)
Sodium: 138 mmol/L (ref 135–145)
Total Bilirubin: 1.2 mg/dL (ref 0.3–1.2)
Total Protein: 7.5 g/dL (ref 6.5–8.1)

## 2022-08-17 LAB — TYPE AND SCREEN
ABO/RH(D): O POS
Antibody Screen: NEGATIVE

## 2022-08-17 LAB — CBC
HCT: 40.7 % (ref 36.0–46.0)
Hemoglobin: 13.4 g/dL (ref 12.0–15.0)
MCH: 30.5 pg (ref 26.0–34.0)
MCHC: 32.9 g/dL (ref 30.0–36.0)
MCV: 92.5 fL (ref 80.0–100.0)
Platelets: 174 10*3/uL (ref 150–400)
RBC: 4.4 MIL/uL (ref 3.87–5.11)
RDW: 13.4 % (ref 11.5–15.5)
WBC: 8.3 10*3/uL (ref 4.0–10.5)
nRBC: 0 % (ref 0.0–0.2)

## 2022-08-17 LAB — PROTIME-INR
INR: 1 (ref 0.8–1.2)
Prothrombin Time: 13.6 seconds (ref 11.4–15.2)

## 2022-08-17 LAB — HEMOGLOBIN AND HEMATOCRIT, BLOOD
HCT: 40.4 % (ref 36.0–46.0)
HCT: 42.2 % (ref 36.0–46.0)
HCT: 41.2 % (ref 36.0–46.0)
HCT: 39 % (ref 36.0–46.0)
Hemoglobin: 13.4 g/dL (ref 12.0–15.0)
Hemoglobin: 13.9 g/dL (ref 12.0–15.0)
Hemoglobin: 13.7 g/dL (ref 12.0–15.0)
Hemoglobin: 13.3 g/dL (ref 12.0–15.0)

## 2022-08-17 SURGERY — VISCERAL ANGIOGRAPHY
Anesthesia: Moderate Sedation

## 2022-08-17 MED ORDER — DIPHENHYDRAMINE HCL 50 MG/ML IJ SOLN: 50.0000 mg | Freq: Once | INTRAMUSCULAR | Status: AC | PRN

## 2022-08-17 MED ORDER — IPRATROPIUM-ALBUTEROL 0.5-2.5 (3) MG/3ML IN SOLN
3.0000 mL | Freq: Four times a day (QID) | RESPIRATORY_TRACT | Status: DC | PRN
  Administered 2022-08-18 (×2): 3 mL via RESPIRATORY_TRACT
  Filled 2022-08-17 (×2): qty 3

## 2022-08-17 MED ORDER — LIDOCAINE-EPINEPHRINE (PF) 1 %-1:200000 IJ SOLN
INTRAMUSCULAR | Status: DC | PRN
  Administered 2022-08-17: 10 mL

## 2022-08-17 MED ORDER — FAMOTIDINE 20 MG PO TABS
ORAL_TABLET | ORAL | Status: AC
  Administered 2022-08-17: 40 mg via ORAL
  Filled 2022-08-17: qty 2

## 2022-08-17 MED ORDER — CEFAZOLIN SODIUM-DEXTROSE 2-4 GM/100ML-% IV SOLN
2.0000 g | INTRAVENOUS | Status: DC
  Filled 2022-08-17: qty 100

## 2022-08-17 MED ORDER — LORATADINE 10 MG PO TABS
10.0000 mg | ORAL_TABLET | Freq: Every day | ORAL | Status: DC
  Filled 2022-08-17 (×3): qty 1

## 2022-08-17 MED ORDER — ONDANSETRON HCL 4 MG/2ML IJ SOLN
4.0000 mg | Freq: Four times a day (QID) | INTRAMUSCULAR | Status: DC | PRN
  Administered 2022-08-17: 4 mg via INTRAVENOUS

## 2022-08-17 MED ORDER — MIDAZOLAM HCL 2 MG/ML PO SYRP: 8.0000 mg | ORAL_SOLUTION | Freq: Once | ORAL | Status: DC | PRN

## 2022-08-17 MED ORDER — MIDAZOLAM HCL 2 MG/2ML IJ SOLN
INTRAMUSCULAR | Status: DC | PRN
  Administered 2022-08-17: 1 mg via INTRAVENOUS

## 2022-08-17 MED ORDER — FENTANYL CITRATE (PF) 100 MCG/2ML IJ SOLN
INTRAMUSCULAR | Status: DC | PRN
  Administered 2022-08-17: 25 ug via INTRAVENOUS

## 2022-08-17 MED ORDER — METHYLPREDNISOLONE SODIUM SUCC 125 MG IJ SOLR
INTRAMUSCULAR | Status: AC
  Administered 2022-08-17: 125 mg via INTRAVENOUS
  Filled 2022-08-17: qty 2

## 2022-08-17 MED ORDER — ONDANSETRON HCL 4 MG/2ML IJ SOLN
4.0000 mg | Freq: Four times a day (QID) | INTRAMUSCULAR | Status: DC | PRN
  Administered 2022-08-17 – 2022-08-18 (×2): 4 mg via INTRAVENOUS
  Filled 2022-08-17 (×2): qty 2

## 2022-08-17 MED ORDER — POLYETHYLENE GLYCOL 3350 17 G PO PACK
17.0000 g | PACK | Freq: Every day | ORAL | Status: DC
  Administered 2022-08-19 – 2022-08-20 (×2): 17 g via ORAL
  Filled 2022-08-17 (×3): qty 1

## 2022-08-17 MED ORDER — IOHEXOL 350 MG/ML SOLN
80.0000 mL | Freq: Once | INTRAVENOUS | Status: AC | PRN
  Administered 2022-08-17: 80 mL via INTRAVENOUS

## 2022-08-17 MED ORDER — FENTANYL CITRATE PF 50 MCG/ML IJ SOSY: 12.5000 ug | PREFILLED_SYRINGE | Freq: Once | INTRAMUSCULAR | Status: DC | PRN

## 2022-08-17 MED ORDER — POLYETHYLENE GLYCOL 3350 17 GM/SCOOP PO POWD
8.5000 g | Freq: Every day | ORAL | Status: DC
  Filled 2022-08-17: qty 255

## 2022-08-17 MED ORDER — ASPIRIN 81 MG PO TBEC
81.0000 mg | DELAYED_RELEASE_TABLET | Freq: Every day | ORAL | Status: DC
  Administered 2022-08-19 – 2022-08-20 (×2): 81 mg via ORAL
  Filled 2022-08-17 (×3): qty 1

## 2022-08-17 MED ORDER — FENTANYL CITRATE PF 50 MCG/ML IJ SOSY
PREFILLED_SYRINGE | INTRAMUSCULAR | Status: AC
  Filled 2022-08-17: qty 1

## 2022-08-17 MED ORDER — HYDRALAZINE HCL 20 MG/ML IJ SOLN
5.0000 mg | Freq: Once | INTRAMUSCULAR | Status: AC
  Administered 2022-08-17: 5 mg via INTRAVENOUS

## 2022-08-17 MED ORDER — IODIXANOL 320 MG/ML IV SOLN
INTRAVENOUS | Status: DC | PRN
  Administered 2022-08-17: 35 mL

## 2022-08-17 MED ORDER — ATORVASTATIN CALCIUM 20 MG PO TABS
20.0000 mg | ORAL_TABLET | Freq: Every day | ORAL | Status: DC
  Administered 2022-08-18 – 2022-08-20 (×3): 20 mg via ORAL
  Filled 2022-08-17 (×3): qty 1

## 2022-08-17 MED ORDER — SODIUM CHLORIDE 0.9 % IV SOLN: INTRAVENOUS | Status: DC

## 2022-08-17 MED ORDER — ONDANSETRON HCL 4 MG/2ML IJ SOLN
INTRAMUSCULAR | Status: AC
  Filled 2022-08-17: qty 2

## 2022-08-17 MED ORDER — HYDRALAZINE HCL 25 MG PO TABS
25.0000 mg | ORAL_TABLET | Freq: Four times a day (QID) | ORAL | Status: DC | PRN
  Administered 2022-08-19: 25 mg via ORAL
  Filled 2022-08-17 (×2): qty 1

## 2022-08-17 MED ORDER — PANTOPRAZOLE SODIUM 40 MG PO TBEC
40.0000 mg | DELAYED_RELEASE_TABLET | Freq: Two times a day (BID) | ORAL | Status: DC
  Administered 2022-08-18 – 2022-08-20 (×4): 40 mg via ORAL
  Filled 2022-08-17 (×5): qty 1

## 2022-08-17 MED ORDER — MIDAZOLAM HCL 5 MG/5ML IJ SOLN
INTRAMUSCULAR | Status: AC
  Filled 2022-08-17: qty 5

## 2022-08-17 MED ORDER — HYDROMORPHONE HCL 1 MG/ML IJ SOLN: 1.0000 mg | Freq: Once | INTRAMUSCULAR | Status: DC | PRN

## 2022-08-17 MED ORDER — HEPARIN (PORCINE) IN NACL 1000-0.9 UT/500ML-% IV SOLN
INTRAVENOUS | Status: DC | PRN
  Administered 2022-08-17: 1000 mL

## 2022-08-17 MED ORDER — LOSARTAN POTASSIUM 25 MG PO TABS: 12.5000 mg | ORAL_TABLET | Freq: Two times a day (BID) | ORAL | Status: DC

## 2022-08-17 MED ORDER — IOHEXOL 300 MG/ML  SOLN: 80.0000 mL | Freq: Once | INTRAMUSCULAR | Status: DC | PRN

## 2022-08-17 MED ORDER — HYDRALAZINE HCL 20 MG/ML IJ SOLN
INTRAMUSCULAR | Status: AC
  Filled 2022-08-17: qty 1

## 2022-08-17 MED ORDER — ACETAMINOPHEN 325 MG PO TABS
650.0000 mg | ORAL_TABLET | Freq: Four times a day (QID) | ORAL | Status: DC | PRN
  Administered 2022-08-18 – 2022-08-20 (×3): 650 mg via ORAL
  Filled 2022-08-17 (×3): qty 2

## 2022-08-17 MED ORDER — DIPHENHYDRAMINE HCL 50 MG/ML IJ SOLN
INTRAMUSCULAR | Status: AC
  Administered 2022-08-17: 25 mg via INTRAVENOUS
  Filled 2022-08-17: qty 1

## 2022-08-17 MED ORDER — PIPERACILLIN-TAZOBACTAM 3.375 G IVPB
3.3750 g | Freq: Three times a day (TID) | INTRAVENOUS | Status: DC
  Administered 2022-08-17 – 2022-08-20 (×9): 3.375 g via INTRAVENOUS
  Filled 2022-08-17 (×10): qty 50

## 2022-08-17 MED ORDER — HEPARIN SODIUM (PORCINE) 1000 UNIT/ML IJ SOLN
INTRAMUSCULAR | Status: AC
  Filled 2022-08-17: qty 10

## 2022-08-17 MED ORDER — METHYLPREDNISOLONE SODIUM SUCC 125 MG IJ SOLR: 125.0000 mg | Freq: Once | INTRAMUSCULAR | Status: AC | PRN

## 2022-08-17 MED ORDER — FAMOTIDINE 20 MG PO TABS: 40.0000 mg | ORAL_TABLET | Freq: Once | ORAL | Status: AC | PRN

## 2022-08-17 MED ORDER — RISAQUAD PO CAPS
1.0000 | ORAL_CAPSULE | Freq: Three times a day (TID) | ORAL | Status: DC
  Administered 2022-08-17 – 2022-08-20 (×6): 1 via ORAL
  Filled 2022-08-17 (×7): qty 1

## 2022-08-17 SURGICAL SUPPLY — 16 items
CATH ANGIO 5F PIGTAIL 65CM (CATHETERS) IMPLANT
CATH MICROCATH PRGRT 2.8F 110 (CATHETERS) IMPLANT
CATH VS15FR (CATHETERS) IMPLANT
COIL 400 COMPLEX SOFT 3X15CM (Vascular Products) IMPLANT
DEVICE OCCLUSION PODJ15 (Vascular Products) IMPLANT
DEVICE STARCLOSE SE CLOSURE (Vascular Products) IMPLANT
GLIDEWIRE STIFF .35X180X3 HYDR (WIRE) IMPLANT
HANDLE DETACHMENT COIL (MISCELLANEOUS) IMPLANT
MICROCATH PROGREAT 2.8F 110 CM (CATHETERS) ×1
MICROSPHERE 600+75UM (Embolic) IMPLANT
OCCLUSION DEVICE PODJ15 (Vascular Products) ×1 IMPLANT
PACK ANGIOGRAPHY (CUSTOM PROCEDURE TRAY) ×1 IMPLANT
SHEATH BRITE TIP 5FRX11 (SHEATH) IMPLANT
SYR MEDRAD MARK 7 150ML (SYRINGE) IMPLANT
TUBING CONTRAST HIGH PRESS 72 (TUBING) IMPLANT
WIRE GUIDERIGHT .035X150 (WIRE) IMPLANT

## 2022-08-17 NOTE — Progress Notes (Signed)
Repeat H/H 13.4, and vital signs stable. Will hold off IR consult and continue monitor, next H/H reading will be 16:00 and 22:00  Case discussed with GI Dr. Servando Snare.

## 2022-08-17 NOTE — H&P (Signed)
History and Physical    Wanda Nelson UVO:[id number] DOB: [birth year] DOA: 08/17/2022  PCP: Dana Allan, MD (Confirm with patient/family/NH records and if not entered, this has to be entered at [facility] point of entry) Patient coming from: Home  I have personally briefly reviewed patient's old medical records in [facility]  Chief Complaint: blood clot in the stool  HPI: Wanda Nelson is a 89 y.o. female with medical history significant of atrial myxoma status post excision 2020, large hiatal hernia status post repair 2023, HTN, HLD, PAF not on anticoagulations, presented with new onset of rectal bleeding  Symptoms started yesterday patient started to pass small amount of blood clot, bright red color, small amount, 2 times yesterday and 1 episode this morning with similar amount but denies any abdominal pain no nauseous vomiting.  Denies any lightheadedness palpitations or chest pains.  No history of lower GI bleed.  At baseline patient eats healthy and she walks 3 miles a day, and she underwent endoscopic hiatal hernia repair November last year and had a normal left-sided cath in 2020. ED Course: Afebrile, no tachycardia no hypotension not hypoxic.  Blood work showed hemoglobin 13.4, BUN 10 creatinine 0.7.  CTA of abdomen pelvis showed no active bleeding but 2.5 cm long segment of proximal sigmoid colon shows signs of early/subacute diverticulitis, also reported signs of active bleeding in the same area.  Review of Systems: As per HPI otherwise 14 point review of systems negative.    Past Medical History:  Diagnosis Date   Actinic keratosis    Anemia    Aortic atherosclerosis (HCC)    Atrial myxoma 03/17/2018   a.) TTE 03/17/2018 --> 2.4 x 2.5 cm LEFT atrial mass attached at fossa ovalis/atrial septum; pedunculated in shape and solid in texture; b.) s/p atrial myoxma resection 03/21/2018; initially attempted via minimally invasive RIGHT thoracotomy approach, however due to  bleeding complications, procedure required median sternotomy and patient going on cardiopulmonary bypass   Basal cell carcinoma 05/17/2021   L upper back 5.0 cm lat to spine - ED&C   Diastolic dysfunction    a.) TTE 03/17/2018: EF 55-60%, LVH, mild PR, G1DD; b.) TTE 07/25/2018: EF 60-65%, RVSP 44.9, mild-mod MR/TR, G1DD; c.) TTE 12/18/2018: EF 60-65%; d.) TTE 06/12/2019: EF 50-55, mild BAE, triv TR, PASP 37.5, G2DD; e.) TTE 07/22/2020: EF 60-65%, mild MR, mod TR, RVSP 42.7, G1DD; f.) TTE 05/08/2021: EF 60-65%, mild LVH, mild-mod TR, AoV sclerosis without stenosis   Dyspnea    Fatigue 12/19/2021   GERD (gastroesophageal reflux disease)    Glaucoma    Hemorrhagic cerebrovascular accident (CVA) (HCC) 1990   Hiatal hernia    Hiatal hernia 05/16/2021   History of 2019 novel coronavirus disease (COVID-19)    a.) 05/30/20 - Tx'd with "covid 19 pill"; b.) 10/2021   History of blood transfusion    History of hiatal hernia    History of left atrial appendage closure 03/21/2018   a.) #45 Atricure   Hyperlipidemia    Hypertension    Hypoxia    Incidental pulmonary nodule, > 3mm and < 8mm 03/20/2018   Noted on CT scan - needs f/u imaging 6 months   Lung fibrosis (HCC) 11/16/2020   NSVT (nonsustained ventricular tachycardia) (HCC) 05/13/2018   a.) holter 05/13/2018 --> 5 short runs   PAF (paroxysmal atrial fibrillation) (HCC) 05/13/2018   a.) holter 05/13/2018 --> intermittent episodes with longest lasting approx 1.5 hours (burden <1%).   Pain due to onychomycosis of toenails  of both feet 07/28/2020   Positive self-administered antigen test for COVID-19 10/18/2021   PSVT (paroxysmal supraventricular tachycardia) 05/13/2018   a.) holter 05/13/2018 --> frequent runs of SVT noted during study   Pulmonary fibrosis (HCC)    Squamous cell carcinoma of skin 11/12/2019   L distal dorsum forearm - ED&C    Viral URI 05/31/2020    Past Surgical History:  Procedure Laterality Date   BUNIONECTOMY  Bilateral    CLIPPING OF ATRIAL APPENDAGE  03/21/2018   Procedure: Clipping Of Atrial Appendage - using an AtriCure 45mm clip;  Surgeon: Purcell Nails, MD;  Location: [facility] OR;  Service: Open Heart Surgery;;   ESOPHAGOGASTRODUODENOSCOPY (EGD) WITH PROPOFOL N/A 09/06/2021   Procedure: ESOPHAGOGASTRODUODENOSCOPY (EGD) WITH PROPOFOL;  Surgeon: Wyline Mood, MD;  Location: [facility] ENDOSCOPY;  Service: Gastroenterology;  Laterality: N/A;   EXCISION OF ATRIAL MYXOMA N/A 03/21/2018   Procedure: Excision Of Atrial Myxoma;  Surgeon: Purcell Nails, MD;  Location: [facility] OR;  Service: Open Heart Surgery;  Laterality: N/A;   RECTOCELE REPAIR     RIGHT/LEFT HEART CATH AND CORONARY ANGIOGRAPHY N/A 03/19/2018   Procedure: RIGHT/LEFT HEART CATH AND CORONARY ANGIOGRAPHY;  Surgeon: Iran Ouch, MD;  Location: [facility] INVASIVE CV LAB;  Service: Cardiovascular;  Laterality: N/A;   TEE WITHOUT CARDIOVERSION N/A 03/21/2018   Procedure: TRANSESOPHAGEAL ECHOCARDIOGRAM (TEE);  Surgeon: Purcell Nails, MD;  Location: [facility] OR;  Service: Open Heart Surgery;  Laterality: N/A;   TONSILLECTOMY     TOTAL ABDOMINAL HYSTERECTOMY  01/09/1980   XI ROBOTIC ASSISTED PARAESOPHAGEAL HERNIA REPAIR N/A 11/14/2021   Procedure: XI ROBOTIC ASSISTED PARAESOPHAGEAL HERNIA REPAIR, RNFA to assist;  Surgeon: Leafy Ro, MD;  Location: [facility] ORS;  Service: General;  Laterality: N/A;     reports that she has never smoked. She has never used smokeless tobacco. She reports that she does not drink alcohol and does not use drugs.  Allergies  Allergen Reactions   Bee Venom Anaphylaxis and Hives   Codeine Other (See Comments)    Altered mental status Altered mental status   Morphine And Codeine     Altered mental status   Shellfish Allergy Anaphylaxis and Hives    Mainly shrimp   Bactrim [Sulfamethoxazole-Trimethoprim]     ? Reaction     Norvasc [Amlodipine]     2.5 leg edema    Nitrofurantoin Rash    Family History  Problem Relation  Age of Onset   Colon cancer Brother    Cancer Brother        neck tumor cancerous    Esophageal varices Brother    Colon cancer Maternal Uncle    Colon cancer Maternal Grandmother    Kidney disease Brother    Coronary artery disease Brother        with stent, cabg   Breast cancer Neg Hx      Prior to Admission medications   Medication Sig Start Date End Date Taking? Authorizing Provider  acetaminophen (TYLENOL) 325 MG tablet Take 2 tablets (650 mg total) by mouth every 6 (six) hours as needed for mild pain (or Fever >/= 101). 05/09/21   Leeroy Bock, MD  Ascorbic Acid (VITAMIN C PO) Take by mouth daily at 12 noon.    [provider]  aspirin EC 81 MG tablet Take 1 tablet (81 mg total) by mouth daily. 05/16/21   McLean-Scocuzza, Pasty Spillers, MD  atorvastatin (LIPITOR) 20 MG tablet Take 1 tablet (20 mg total) by mouth daily at  6 PM. 10/17/21   McLean-Scocuzza, Pasty Spillers, MD  Calcium Carbonate-Vitamin D 500-125 MG-UNIT TABS Take 1 tablet by mouth daily.    [provider]  cholecalciferol (VITAMIN D) 1000 units tablet Take 1,000 Units by mouth daily.    [provider]  ipratropium-albuterol (DUONEB) 0.5-2.5 (3) MG/3ML SOLN INHALE THE CONTENTS OF ONE (1) VIAL VIA NEBULIZATION EVERY 6 HOURS AS NEEDED 08/15/21   Worthy Rancher B, FNP  loratadine (CLARITIN) 10 MG tablet Take 1 tablet (10 mg total) by mouth daily. As needed at night 10/17/21 10/12/22  McLean-Scocuzza, Pasty Spillers, MD  losartan (COZAAR) 25 MG tablet Take 0.5-1 tablets (12.5-25 mg total) by mouth 2 (two) times daily. If BP >140/>80 take losartan 25 mg bid 10/17/21   McLean-Scocuzza, Pasty Spillers, MD  Multiple Vitamin (MULTIVITAMIN) tablet Take 1 tablet by mouth daily.    [provider]  pantoprazole (PROTONIX) 40 MG tablet Take 1 tablet (40 mg total) by mouth 2 (two) times daily. 05/06/22   Dana Allan, MD  polyethylene glycol powder (GLYCOLAX/MIRALAX) 17 GM/SCOOP powder Take 8.5 g by mouth daily. 05/15/22    Dana Allan, MD    Physical Exam: Vitals:   08/17/22 0641 08/17/22 0643 08/17/22 1001  BP:  (!) 186/95 (!) 139/118  Pulse:  82 75  Resp:  18 (!) 24  Temp:   97.9 F (36.6 C)  TempSrc:  Oral Oral  SpO2:  92% 98%  Weight: 45.4 kg  45.4 kg  Height: 5\' 1"  (1.549 m)  5\' 1"  (1.549 m)    Constitutional: NAD, calm, comfortable Vitals:   08/17/22 0641 08/17/22 0643 08/17/22 1001  BP:  (!) 186/95 (!) 139/118  Pulse:  82 75  Resp:  18 (!) 24  Temp:   97.9 F (36.6 C)  TempSrc:  Oral Oral  SpO2:  92% 98%  Weight: 45.4 kg  45.4 kg  Height: 5\' 1"  (1.549 m)  5\' 1"  (1.549 m)   Eyes: PERRL, lids and conjunctivae normal ENMT: Mucous membranes are moist. Posterior pharynx clear of any exudate or lesions.Normal dentition.  Neck: normal, supple, no masses, no thyromegaly Respiratory: clear to auscultation bilaterally, no wheezing, no crackles. Normal respiratory effort. No accessory muscle use.  Cardiovascular: Regular rate and rhythm, no murmurs / rubs / gallops. No extremity edema. 2+ pedal pulses. No carotid bruits.  Abdomen: no tenderness, no masses palpated. No hepatosplenomegaly. Bowel sounds positive.  Musculoskeletal: no clubbing / cyanosis. No joint deformity upper and lower extremities. Good ROM, no contractures. Normal muscle tone.  Skin: no rashes, lesions, ulcers. No induration Neurologic: CN 2-12 grossly intact. Sensation intact, DTR normal. Strength 5/5 in all 4.  Psychiatric: Normal judgment and insight. Alert and oriented x 3. Normal mood.     Labs on Admission: I have personally reviewed following labs and imaging studies  CBC: Recent Labs  Lab 08/17/22 0652  WBC 8.3  HGB 13.4  HCT 40.7  MCV 92.5  PLT 174   Basic Metabolic Panel: Recent Labs  Lab 08/17/22 0652  NA 138  K 3.7  CL 104  CO2 24  GLUCOSE 94  BUN 10  CREATININE 0.79  CALCIUM 9.3   GFR: Estimated Creatinine Clearance: 34.2 mL/min (by C-G formula based on SCr of 0.79 mg/dL). Liver Function  Tests: Recent Labs  Lab 08/17/22 0652  AST 20  ALT 15  ALKPHOS 73  BILITOT 1.2  PROT 7.5  ALBUMIN 3.7   No results for input(s): "LIPASE", "AMYLASE" in the last 168 hours.  No results for input(s): "AMMONIA" in the last 168 hours. Coagulation Profile: No results for input(s): "INR", "PROTIME" in the last 168 hours. Cardiac Enzymes: No results for input(s): "CKTOTAL", "CKMB", "CKMBINDEX", "TROPONINI" in the last 168 hours. BNP (last 3 results) No results for input(s): "PROBNP" in the last 8760 hours. HbA1C: No results for input(s): "HGBA1C" in the last 72 hours. CBG: No results for input(s): "GLUCAP" in the last 168 hours. Lipid Profile: No results for input(s): "CHOL", "HDL", "LDLCALC", "TRIG", "CHOLHDL", "LDLDIRECT" in the last 72 hours. Thyroid Function Tests: No results for input(s): "TSH", "T4TOTAL", "FREET4", "T3FREE", "THYROIDAB" in the last 72 hours. Anemia Panel: No results for input(s): "VITAMINB12", "FOLATE", "FERRITIN", "TIBC", "IRON", "RETICCTPCT" in the last 72 hours. Urine analysis:    Component Value Date/Time   COLORURINE YELLOW 11/05/2019 0832   APPEARANCEUR Clear 05/13/2020 1016   LABSPEC 1.012 11/05/2019 0832   PHURINE 6.5 11/05/2019 0832   GLUCOSEU Negative 05/13/2020 1016   HGBUR 1+ (A) 11/05/2019 0832   BILIRUBINUR negative 04/11/2022 1016   BILIRUBINUR Negative 05/13/2020 1016   KETONESUR NEGATIVE 11/05/2019 0832   PROTEINUR Negative 04/11/2022 1016   PROTEINUR Negative 05/13/2020 1016   PROTEINUR TRACE (A) 11/05/2019 0832   UROBILINOGEN 0.2 04/11/2022 1016   NITRITE negative 04/11/2022 1016   NITRITE Negative 05/13/2020 1016   NITRITE POSITIVE (A) 11/05/2019 0832   LEUKOCYTESUR Large (3+) (A) 04/11/2022 1016   LEUKOCYTESUR 2+ (A) 05/13/2020 1016   LEUKOCYTESUR 3+ (A) 11/05/2019 0832    Radiological Exams on Admission: CT ANGIO GI BLEED  Result Date: 08/17/2022 CLINICAL DATA:  Abdominal bleeding, BRBPR, eval diverticulosis, h/o hiatal  hernia EXAM: CTA ABDOMEN AND PELVIS WITHOUT AND WITH CONTRAST TECHNIQUE: Multidetector CT imaging of the abdomen and pelvis was performed using the standard protocol during bolus administration of intravenous contrast. Multiplanar reconstructed images and MIPs were obtained and reviewed to evaluate the vascular anatomy. RADIATION DOSE REDUCTION: This exam was performed according to the departmental dose-optimization program which includes automated exposure control, adjustment of the mA and/or kV according to patient size and/or use of iterative reconstruction technique. CONTRAST:  80mL OMNIPAQUE IOHEXOL 350 MG/ML SOLN COMPARISON:  CT scan abdomen and pelvis from 04/19/2022. FINDINGS: VASCULAR Aorta: Normal caliber aorta without aneurysm, dissection, vasculitis or significant stenosis. Celiac: Patent without evidence of aneurysm, dissection, vasculitis or significant stenosis. SMA: Patent without evidence of aneurysm, dissection, vasculitis or significant stenosis. Renals: Both renal arteries are patent without evidence of aneurysm, dissection, vasculitis, fibromuscular dysplasia or significant stenosis. IMA: Patent without evidence of aneurysm, dissection, vasculitis or significant stenosis. Inflow: Patent without evidence of aneurysm, dissection, vasculitis or significant stenosis. Proximal Outflow: Bilateral common femoral and visualized portions of the superficial and profunda femoral arteries are patent without evidence of aneurysm, dissection, vasculitis or significant stenosis. Veins: No obvious venous abnormality within the limitations of this arterial phase study. Review of the MIP images confirms the above findings. NON-VASCULAR Lower chest: Redemonstration of diffuse interstitial thickening along with ground-glass changes and patchy areas of bronchiectasis in the visualized bilateral lung bases, concerning for pulmonary fibrosis. No dense consolidation or pleural effusion. There is a stable subpleural 5 x  5 mm solid noncalcified nodule in the left lung lower lobe, present since the prior study from 03/30/2019. The heart is enlarged in size. No pericardial effusion. Hepatobiliary: The liver is normal in size. Non-cirrhotic configuration. No suspicious mass. There is a stable 9 x 12 mm simple cyst in the right hepatic dome. No intrahepatic or extrahepatic bile duct dilation.  No calcified gallstones. Normal gallbladder wall thickness. No pericholecystic inflammatory changes. Pancreas: Unremarkable. No pancreatic ductal dilatation or surrounding inflammatory changes. Spleen: Within normal limits. There are stable punctate calcified granulomas. There is a stable approximately 1 cm calcification abutting the surface of the anterior superior spleen, unchanged. Adrenals/Urinary Tract: Adrenal glands are unremarkable. No suspicious renal mass. No hydronephrosis. No renal or ureteric calculi. Unremarkable urinary bladder. Stomach/Bowel: There is a small sliding hiatal hernia. No disproportionate dilation of the small or large bowel loops. The appendix was not visualized; however there is no acute inflammatory process in the right lower quadrant. Small-to-moderate amount of stool burden. There is an approximately 2.5-3 cm long segment of proximal sigmoid colon which exhibits asymmetric fullness and probable subtle pericolonic fat stranding on the background of several diverticula, concerning for early/subacute diverticulitis. In this region, there are linear areas of contrast extravasation on arterial phase images (series 12, image 59), which increases in amount on delayed phase images (series 19, images 59-63), compatible with active GI bleed. Underlying tumor can not be excluded on this exam. Correlation with colonoscopy is recommended. Vascular/Lymphatic: No ascites or pneumoperitoneum. No abdominal or pelvic lymphadenopathy, by size criteria. No aneurysmal dilation of the major abdominal arteries. There are mild peripheral  atherosclerotic vascular calcifications of the aorta and its major branches. Reproductive: The uterus is surgically absent. No large adnexal mass. Other: The visualized soft tissues and abdominal wall are unremarkable. Musculoskeletal: No suspicious osseous lesions. There are mild - moderate multilevel degenerative changes in the visualized spine. IMPRESSION: 1. Approximately 2.5-3 cm long segment of proximal sigmoid colon exhibits asymmetric fullness and probable subtle pericolonic fat stranding on the background of several diverticula, concerning for early/subacute diverticulitis. In this region, there is suggestion of active GI bleed, as described in detail above. Underlying tumor can not be excluded on this exam. Correlation with colonoscopy is recommended. 2. Multiple other nonacute observations, as described above. Aortic Atherosclerosis (ICD10-I70.0). Electronically Signed   By: Jules Schick M.D.   On: 08/17/2022 09:01   DG Chest Portable 1 View  Result Date: 08/17/2022 CLINICAL DATA:  89 year old female with history of shortness of breath. EXAM: PORTABLE CHEST 1 VIEW COMPARISON:  Chest x-ray 05/08/2021. FINDINGS: Lung volumes are slightly low. Diffuse interstitial prominence and widespread peribronchial cuffing, similar but slightly increased when compared to the prior examination. No confluent consolidative airspace disease. No pleural effusions. No pneumothorax. Pulmonary vasculature does not appear engorged. Heart size is upper limits of normal. Atherosclerotic calcifications are noted in the thoracic aorta. Status post median sternotomy. Left atrial appendage ligation clip noted. IMPRESSION: 1. Overall, the appearance of the chest suggests progressive chronic interstitial lung disease. Follow-up nonemergent high-resolution chest CT is suggested to better evaluate these findings. 2. Aortic atherosclerosis. Electronically Signed   By: Trudie Reed M.D.   On: 08/17/2022 07:11    EKG:  Independently reviewed.  Sinus rhythm, no acute ST changes, prolonged QTc  Assessment/Plan Principal Problem:   GI bleed Active Problems:   Diverticulitis large intestine  (please populate well all problems here in Problem List. (For example, if patient is on BP meds at home and you resume or decide to hold them, it is a problem that needs to be her. Same for CAD, COPD, HLD and so on)  Hematochezia -Secondary to lower GI bleed from diverticulosis and early diverticulitis, underlying colon malignancy cannot be ruled out at this point. -CTA showed signs of active bleeding, but patient appears to be hemodynamically stable at this point, will  recheck H&H at noon time, if any significant drop of H&H or significant change of vital signs will consider IR intervention.  Case was discussed with GI Dr. Servando Snare, who recommend Case discussed with IR versus vascular surgery and colonoscopy contraindicated with active bleeding.  Acute diverticulitis -Plan to continue Zosyn -Add probiotics  HTN, uncontrolled -Hold off home BP meds -Start as needed hydralazine  Hx of PAF -According to outpatient cardiologist note, only single episode of A-fib was found before, and patient not on anticoagulation -Continue aspirin   DVT prophylaxis: SCD Code Status: Full code Family Communication: None at bedside Disposition Plan: Patient is sick with lower GI bleed and acute diverticulitis requiring IV antibiotics, expect more than 2 midnight hospital stay Consults called: Vacular surgery Admission status: Tele admit   Emeline General MD [facility] Pager [phone]  08/17/2022, 10:09 AM

## 2022-08-17 NOTE — ED Triage Notes (Signed)
Pt reports rectal bleeding since yesterday. States it is not constant but " every once in a while I have to poop out a blood clot." Denies hx of same or any daily blood thinners. Pt also reports SOB since yesterday. Denies chest pain. Denies abd pain. Denies abd pain, n/v/d. Denies urinary symptoms. Pt ambulatory to triage. Alert and oriented following commands. Breathing unlabored at rest but DOE noted. Symmetric chest rise and fall and pt speaking in full sentences.

## 2022-08-17 NOTE — Interval H&P Note (Signed)
History and Physical Interval Note:  08/17/2022 2:54 PM  Wanda Nelson  has presented today for surgery, with the diagnosis of GI BLEED.  The various methods of treatment have been discussed with the patient and family. After consideration of risks, benefits and other options for treatment, the patient has consented to  Procedure(s): VISCERAL ANGIOGRAPHY (N/A) as a surgical intervention.  The patient's history has been reviewed, patient examined, no change in status, stable for surgery.  I have reviewed the patient's chart and labs.  Questions were answered to the patient's satisfaction.     Festus Barren

## 2022-08-17 NOTE — OR Nursing (Signed)
Sudden On set of nausea. Relieved with 4 mg iv zofran

## 2022-08-17 NOTE — Progress Notes (Signed)
Pharmacy Antibiotic Note  Wanda Nelson is a 89 y.o. female admitted on 08/17/2022 with  diverticulitis .  Pharmacy has been consulted for zosyn dosing. -SHOB, rectal bleeding  Plan: Zosyn 3.375g IV q8h (4 hour infusion).   Height: 5\' 1"  (154.9 cm) Weight: 45.4 kg (100 lb) IBW/kg (Calculated) : 47.8  No data recorded.  Recent Labs  Lab 08/17/22 0652  WBC 8.3  CREATININE 0.79    Estimated Creatinine Clearance: 34.2 mL/min (by C-G formula based on SCr of 0.79 mg/dL).    Allergies  Allergen Reactions   Bee Venom Anaphylaxis and Hives   Codeine Other (See Comments)    Altered mental status Altered mental status   Morphine And Codeine     Altered mental status   Shellfish Allergy Anaphylaxis and Hives    Mainly shrimp   Bactrim [Sulfamethoxazole-Trimethoprim]     ? Reaction     Norvasc [Amlodipine]     2.5 leg edema    Nitrofurantoin Rash    Antimicrobials this admission: zosyn 8/9 >>       Dose adjustments this admission:    Microbiology results:   BCx:     UCx:      Sputum:      MRSA PCR:    Thank you for allowing pharmacy to be a part of this patient's care.  , A 08/17/2022 9:30 AM

## 2022-08-17 NOTE — Op Note (Signed)
[facility]  Percutaneous Study/Intervention Procedural Note     Surgeon(s): [employer]   Assistants: none  Pre-operative Diagnosis: 1. Lower GI bleed   Post-operative diagnosis:  Same  Procedure(s) Performed:             1.  Ultrasound guidance for vascular access right femoral artery             2.  Catheter placement into 2 sigmoidal branches of the IMA             3.  Aortogram and selective angiogram of the IMA as well as selective imaging of the 2 sigmoidal branches of the IMA             4.  Microbead embolization of the 2 main sigmoidal branches of the IMA with a total of 2 cc of 600  polyvinyl alcohol beads, 1 cc in each branch.  5.  Coil embolization of the larger of the 2 sigmoidal branches with a pair of Ruby coils             6.  StarClose closure device right femoral artery  Anesthesia: Moderate conscious sedation for approximately 23 minutes using 1 mg of Versed and 25 mcg of Fentanyl              EBL: 5 cc  Fluoro Time: 2.3 minutes  Contrast: 30 cc              Indications:  Patient is a 89 y.o.female with brisk lower GI bleeding with resultant anemia. The patient has a CT scan showing diverticulitis as well as active bleeding in the sigmoid colon.  The patient is brought in for angiography for further evaluation and potential treatment. Risks and benefits are discussed and informed consent is obtained  Procedure:  The patient was identified and appropriate procedural time out was performed.  The patient was then placed supine on the table and prepped and draped in the usual sterile fashion. Moderate conscious sedation was administered during a face to face encounter with the patient throughout the procedure with my supervision of the RN administering medicines and monitoring the patient's vital signs, pulse oximetry, telemetry and mental status throughout from the start of the procedure until the patient was taken to the recovery room.   Ultrasound was used to evaluate the right common femoral artery.  It was patent .  A digital ultrasound image was acquired.  A Seldinger needle was used to access the right common femoral artery under direct ultrasound guidance and a permanent image was performed.  A 0.035 J wire was advanced without resistance and a 5Fr sheath was placed.  Pigtail catheter was placed into the aorta and an AP aortogram was performed. This demonstrated fairly normal renal arteries as well as aorta and iliac arteries.  We transitioned to the RAO projection to cannulate the IMA. A V S1 catheter was used to selectively cannulate the IMA.  This demonstrated two primary sigmoidal branches and a more superior branch going to the left colon and splenic flexure. Based on her continued bleeding and the CT scan I elected to treat this area with embolization. I initially advanced the Pro-Great microcatheter out the more distal of the two sigmoidal branches.  Selective imaging was performed and there was brisk flow to the colon and this was the larger of the 2 sigmoidal branches.  I proceeded with treatment and instilled approximately 1 cc of 600 m polyvinyl alcohol beads in this  location.  I also elected to place a small 3 mm diameter by 15 cm length Ruby coil followed by 15 cm packing coil into this branch.  I then pulled back and cannulated the smaller more proximal sigmoidal branch with the Pro-great microcatheter without difficulty. Selective imaging was performed which showed brisk flow into the proximal sigmoid colon.  An additional 1 cc of 600 m polyvinyl alcohol beads were deployed in the more proximal sigmoidal branch. Again, completion angiogram showed the main vessels to be open with less brisk filling. I elected to terminate the procedure. The diagnostic catheter was removed. StarClose closure device was deployed in usual fashion with excellent hemostatic result. The patient was taken to the recovery room in stable condition  having tolerated the procedure well.       Disposition: Patient was taken to the recovery room in stable condition having tolerated the procedure well.  Complications:  None  Festus Barren 08/17/2022 4:46 PM   This note was created with Dragon Medical transcription system. Any errors in dictation are purely unintentional.

## 2022-08-17 NOTE — Consult Note (Signed)
Hospital Consult    Reason for Consult:  GI Bleed Sigmoid Colon Requesting Physician:  Dr. Shaune Pollack MD MRN #:  [id number]  History of Present Illness: This is a 89 y.o. female with medical history significant of atrial myxoma status post excision 2020, large hiatal hernia status post repair 2023, HTN, HLD, PAF not on anticoagulations, presented with new onset of rectal bleeding. Symptoms started yesterday patient started to pass small amount of blood clot, bright red color, small amount, 2 times yesterday and 1 episode this morning with similar amount but denies any abdominal pain no nauseous vomiting. Denies any lightheadedness palpitations or chest pains. No history of lower GI bleed. At baseline patient eats healthy and she walks 3 miles a day, and she underwent endoscopic hiatal hernia repair November last year and had a normal left-sided cath in 2020.   On exam the patient is resting comfortably in the emergency department. No other complaints at this time. Patients vitals all remain stable. Patient states she has been NPO since 8 pm last evening.   Past Medical History:  Diagnosis Date   Actinic keratosis    Anemia    Aortic atherosclerosis (HCC)    Atrial myxoma 03/17/2018   a.) TTE 03/17/2018 --> 2.4 x 2.5 cm LEFT atrial mass attached at fossa ovalis/atrial septum; pedunculated in shape and solid in texture; b.) s/p atrial myoxma resection 03/21/2018; initially attempted via minimally invasive RIGHT thoracotomy approach, however due to bleeding complications, procedure required median sternotomy and patient going on cardiopulmonary bypass   Basal cell carcinoma 05/17/2021   L upper back 5.0 cm lat to spine - ED&C   Diastolic dysfunction    a.) TTE 03/17/2018: EF 55-60%, LVH, mild PR, G1DD; b.) TTE 07/25/2018: EF 60-65%, RVSP 44.9, mild-mod MR/TR, G1DD; c.) TTE 12/18/2018: EF 60-65%; d.) TTE 06/12/2019: EF 50-55, mild BAE, triv TR, PASP 37.5, G2DD; e.) TTE 07/22/2020: EF 60-65%,  mild MR, mod TR, RVSP 42.7, G1DD; f.) TTE 05/08/2021: EF 60-65%, mild LVH, mild-mod TR, AoV sclerosis without stenosis   Dyspnea    Fatigue 12/19/2021   GERD (gastroesophageal reflux disease)    Glaucoma    Hemorrhagic cerebrovascular accident (CVA) (HCC) 1990   Hiatal hernia    Hiatal hernia 05/16/2021   History of 2019 novel coronavirus disease (COVID-19)    a.) 05/30/20 - Tx'd with "covid 19 pill"; b.) 10/2021   History of blood transfusion    History of hiatal hernia    History of left atrial appendage closure 03/21/2018   a.) #45 Atricure   Hyperlipidemia    Hypertension    Hypoxia    Incidental pulmonary nodule, > 3mm and < 8mm 03/20/2018   Noted on CT scan - needs f/u imaging 6 months   Lung fibrosis (HCC) 11/16/2020   NSVT (nonsustained ventricular tachycardia) (HCC) 05/13/2018   a.) holter 05/13/2018 --> 5 short runs   PAF (paroxysmal atrial fibrillation) (HCC) 05/13/2018   a.) holter 05/13/2018 --> intermittent episodes with longest lasting approx 1.5 hours (burden <1%).   Pain due to onychomycosis of toenails of both feet 07/28/2020   Positive self-administered antigen test for COVID-19 10/18/2021   PSVT (paroxysmal supraventricular tachycardia) 05/13/2018   a.) holter 05/13/2018 --> frequent runs of SVT noted during study   Pulmonary fibrosis (HCC)    Squamous cell carcinoma of skin 11/12/2019   L distal dorsum forearm - ED&C    Viral URI 05/31/2020    Past Surgical History:  Procedure Laterality Date   BUNIONECTOMY  Bilateral    CLIPPING OF ATRIAL APPENDAGE  03/21/2018   Procedure: Clipping Of Atrial Appendage - using an AtriCure 45mm clip;  Surgeon: Purcell Nails, MD;  Location: [facility] OR;  Service: Open Heart Surgery;;   ESOPHAGOGASTRODUODENOSCOPY (EGD) WITH PROPOFOL N/A 09/06/2021   Procedure: ESOPHAGOGASTRODUODENOSCOPY (EGD) WITH PROPOFOL;  Surgeon: Wyline Mood, MD;  Location: [facility] ENDOSCOPY;  Service: Gastroenterology;  Laterality: N/A;   EXCISION OF ATRIAL  MYXOMA N/A 03/21/2018   Procedure: Excision Of Atrial Myxoma;  Surgeon: Purcell Nails, MD;  Location: [facility] OR;  Service: Open Heart Surgery;  Laterality: N/A;   RECTOCELE REPAIR     RIGHT/LEFT HEART CATH AND CORONARY ANGIOGRAPHY N/A 03/19/2018   Procedure: RIGHT/LEFT HEART CATH AND CORONARY ANGIOGRAPHY;  Surgeon: Iran Ouch, MD;  Location: [facility] INVASIVE CV LAB;  Service: Cardiovascular;  Laterality: N/A;   TEE WITHOUT CARDIOVERSION N/A 03/21/2018   Procedure: TRANSESOPHAGEAL ECHOCARDIOGRAM (TEE);  Surgeon: Purcell Nails, MD;  Location: [facility] OR;  Service: Open Heart Surgery;  Laterality: N/A;   TONSILLECTOMY     TOTAL ABDOMINAL HYSTERECTOMY  01/09/1980   XI ROBOTIC ASSISTED PARAESOPHAGEAL HERNIA REPAIR N/A 11/14/2021   Procedure: XI ROBOTIC ASSISTED PARAESOPHAGEAL HERNIA REPAIR, RNFA to assist;  Surgeon: Leafy Ro, MD;  Location: [facility] ORS;  Service: General;  Laterality: N/A;    Allergies  Allergen Reactions   Bee Venom Anaphylaxis and Hives   Codeine Other (See Comments)    Altered mental status Altered mental status   Morphine And Codeine     Altered mental status   Shellfish Allergy Anaphylaxis and Hives    Mainly shrimp   Bactrim [Sulfamethoxazole-Trimethoprim]     ? Reaction     Norvasc [Amlodipine]     2.5 leg edema    Nitrofurantoin Rash    Prior to Admission medications   Medication Sig Start Date End Date Taking? Authorizing Provider  acetaminophen (TYLENOL) 325 MG tablet Take 2 tablets (650 mg total) by mouth every 6 (six) hours as needed for mild pain (or Fever >/= 101). 05/09/21   Leeroy Bock, MD  Ascorbic Acid (VITAMIN C PO) Take by mouth daily at 12 noon.    [provider]  aspirin EC 81 MG tablet Take 1 tablet (81 mg total) by mouth daily. 05/16/21   McLean-Scocuzza, Pasty Spillers, MD  atorvastatin (LIPITOR) 20 MG tablet Take 1 tablet (20 mg total) by mouth daily at 6 PM. 10/17/21   McLean-Scocuzza, Pasty Spillers, MD  Calcium Carbonate-Vitamin D  500-125 MG-UNIT TABS Take 1 tablet by mouth daily.    [provider]  cholecalciferol (VITAMIN D) 1000 units tablet Take 1,000 Units by mouth daily.    [provider]  ipratropium-albuterol (DUONEB) 0.5-2.5 (3) MG/3ML SOLN INHALE THE CONTENTS OF ONE (1) VIAL VIA NEBULIZATION EVERY 6 HOURS AS NEEDED 08/15/21   Worthy Rancher B, FNP  loratadine (CLARITIN) 10 MG tablet Take 1 tablet (10 mg total) by mouth daily. As needed at night 10/17/21 10/12/22  McLean-Scocuzza, Pasty Spillers, MD  losartan (COZAAR) 25 MG tablet Take 0.5-1 tablets (12.5-25 mg total) by mouth 2 (two) times daily. If BP >140/>80 take losartan 25 mg bid 10/17/21   McLean-Scocuzza, Pasty Spillers, MD  Multiple Vitamin (MULTIVITAMIN) tablet Take 1 tablet by mouth daily.    [provider]  pantoprazole (PROTONIX) 40 MG tablet Take 1 tablet (40 mg total) by mouth 2 (two) times daily. 05/06/22   Dana Allan, MD  polyethylene glycol powder (GLYCOLAX/MIRALAX) 17 GM/SCOOP powder  Take 8.5 g by mouth daily. 05/15/22   Dana Allan, MD    Social History   Socioeconomic History   Marital status: Widowed    Spouse name: Not on file   Number of children: Not on file   Years of education: Not on file   Highest education level: Bachelor's degree (e.g., BA, AB, BS)  Occupational History   Not on file  Tobacco Use   Smoking status: Never   Smokeless tobacco: Never  Vaping Use   Vaping status: Never Used  Substance and Sexual Activity   Alcohol use: No    Alcohol/week: 0.0 standard drinks of alcohol   Drug use: No   Sexual activity: Not Currently  Other Topics Concern   Not on file  Social History Narrative   Lives [place],  husband died 12-06-18 due to brain hemorrhage   Daughter in law Luis Zepf    widowed   Social Determinants of Health   Financial Resource Strain: Low Risk  (05/17/2022)   Overall Financial Resource Strain (CARDIA)    Difficulty of Paying Living Expenses: Not hard at all  Food Insecurity: No Food  Insecurity (05/17/2022)   Hunger Vital Sign    Worried About Running Out of Food in the Last Year: Never true    Ran Out of Food in the Last Year: Never true  Transportation Needs: No Transportation Needs (05/17/2022)   PRAPARE - Administrator, Civil Service (Medical): No    Lack of Transportation (Non-Medical): No  Physical Activity: Sufficiently Active (05/17/2022)   Exercise Vital Sign    Days of Exercise per Week: 7 days    Minutes of Exercise per Session: 30 min  Stress: No Stress Concern Present (05/17/2022)   [employer] of Occupational Health - Occupational Stress Questionnaire    Feeling of Stress : Not at all  Social Connections: Socially Integrated (05/17/2022)   Social Connection and Isolation Panel [NHANES]    Frequency of Communication with Friends and Family: More than three times a week    Frequency of Social Gatherings with Friends and Family: More than three times a week    Attends Religious Services: More than 4 times per year    Active Member of [employer] or Organizations: Yes    Attends Engineer, structural: More than 4 times per year    Marital Status: Married  Catering manager Violence: Not At Risk (04/14/2020)   Humiliation, Afraid, Rape, and Kick questionnaire    Fear of Current or Ex-Partner: No    Emotionally Abused: No    Physically Abused: No    Sexually Abused: No     Family History  Problem Relation Age of Onset   Colon cancer Brother    Cancer Brother        neck tumor cancerous    Esophageal varices Brother    Colon cancer Maternal Uncle    Colon cancer Maternal Grandmother    Kidney disease Brother    Coronary artery disease Brother        with stent, cabg   Breast cancer Neg Hx     ROS: Otherwise negative unless mentioned in HPI  Physical Examination  Vitals:   08/17/22 0643 08/17/22 1001  BP: (!) 186/95 (!) 139/118  Pulse: 82 75  Resp: 18 (!) 24  Temp:  97.9 F (36.6 C)  SpO2: 92% 98%   Body mass index is 18.89  kg/m.  General:  WDWN in NAD Gait: Not observed HENT:  WNL, normocephalic Pulmonary: normal non-labored breathing, without Rales, rhonchi,  wheezing Cardiac: regular, without  Murmurs, rubs or gallops; without carotid bruits Abdomen: Positive bowel sounds,  soft, Tender on palpation/ND, no masses Skin: without rashes Vascular Exam/Pulses: Palpable Pulses throughout.  Extremities: without ischemic changes, without Gangrene , without cellulitis; without open wounds;  Musculoskeletal: no muscle wasting or atrophy  Neurologic: A&O X 3;  No focal weakness or paresthesias are detected; speech is fluent/normal Psychiatric:  The pt has Normal affect. Lymph:  Unremarkable  CBC    Component Value Date/Time   WBC 8.3 08/17/2022 0652   RBC 4.40 08/17/2022 0652   HGB 13.4 08/17/2022 0652   HCT 40.7 08/17/2022 0652   PLT 174 08/17/2022 0652   MCV 92.5 08/17/2022 0652   MCH 30.5 08/17/2022 0652   MCHC 32.9 08/17/2022 0652   RDW 13.4 08/17/2022 0652   LYMPHSABS 1.8 04/11/2022 1122   MONOABS 0.6 04/11/2022 1122   EOSABS 0.4 04/11/2022 1122   BASOSABS 0.1 04/11/2022 1122    BMET    Component Value Date/Time   NA 138 08/17/2022 0652   K 3.7 08/17/2022 0652   CL 104 08/17/2022 0652   CO2 24 08/17/2022 0652   GLUCOSE 94 08/17/2022 0652   BUN 10 08/17/2022 0652   CREATININE 0.79 08/17/2022 0652   CALCIUM 9.3 08/17/2022 0652   GFRNONAA >60 08/17/2022 0652   GFRAA >60 03/31/2018 0428    COAGS: Lab Results  Component Value Date   INR 1.4 (H) 03/21/2018   INR 1.0 03/20/2018     Non-Invasive Vascular Imaging:   EXAM:08/17/22 CT ABDOMEN AND PELVIS WITH CONTRAST   TECHNIQUE: Multidetector CT imaging of the abdomen and pelvis was performed using the standard protocol following bolus administration of intravenous contrast.   RADIATION DOSE REDUCTION: This exam was performed according to the departmental dose-optimization program which includes automated exposure control, adjustment  of the mA and/or kV according to patient size and/or use of iterative reconstruction technique.   CONTRAST:  85mL OMNIPAQUE IOHEXOL 300 MG/ML  SOLN   COMPARISON:  Noncontrast CT 03/31/2018   FINDINGS: Lower chest: Once again there heart is enlarged. There is diffuse interstitial septal thickening with ground-glass along bases. Please correlate for any known history of chronic lung disease. Patient is status post median sternotomy. There is a patulous contrast filled esophagus. Please correlate for history of fundoplication surgery as well.   Hepatobiliary: Dome right-sided hepatic cysts identified measuring 10 mm with Hounsfield unit of 14. No specific imaging follow-up. Series 2 image 9. The gallbladder is nondilated. Patent portal vein   Pancreas: Unremarkable. No pancreatic ductal dilatation or surrounding inflammatory changes.   Spleen: Rounded calcification seen the superior aspect of the spleen, nonspecific. Is also some heterogeneous enhancement of the spleen with a wedge-shaped area medial and superior along splenic parenchyma on series 2, image 10, nonspecific.   Adrenals/Urinary Tract: The adrenal glands are preserved. Kidneys has some small cysts. Example upper pole on the right measuring 11 mm with Hounsfield units of 17. Tiny focus as well upper pole left kidney. These consistent with Bosniak 1 and 2 lesions. No specific imaging follow-up. Preserved contours of the urinary bladder   Stomach/Bowel: Distended stomach with contrast and debris. The small and large bowel are nondilated. There is diffuse colonic stool. Left-sided colonic diverticulosis. Slightly atypical distribution of bowel with several loops of small bowel extending lateral to the ascending colon. Redundant course seen to the transverse colon extending into the low pelvis in the  cecum resides in the right upper quadrant.   Vascular/Lymphatic: Normal caliber aorta and IVC with some  scattered vascular calcifications. No discrete abnormal lymph node enlargement identified in the abdomen and pelvis.   Reproductive: Status post hysterectomy. No adnexal masses.   Other: No abdominal wall hernia or abnormality. No abdominopelvic ascites.   Musculoskeletal: Moderate degenerative changes of the spine. Trace retrolisthesis of L2-3 and L1-2. Curvature of the spine.   IMPRESSION: Distended stomach with fluid and debris of uncertain etiology. Please correlate with any clinical symptoms.   Left-sided colonic diverticula with moderate colonic stool. No bowel obstruction, free air or free fluid.   No abdominal wall or pelvic wall hernia.   Patulous esophagus with luminal contrast and air. Please correlate with any previous intervention.  Statin:  Yes.   Beta Blocker:  No. Aspirin:  Yes.   ACEI:  No. ARB:  Yes.   CCB use:  No Other antiplatelets/anticoagulants:  No.     ASSESSMENT/PLAN: This is a 89 y.o. female who presents to [facility] Emergency department with GI Bleeding. On work up with CT scan of the abdomen she is noted to have sigmoid colon bleeding.   PLAN: Vascular Surgery plans on taking the patient to the vascular lab later today for embolization of her GI Bleeding. I have discussed in detail with the patient the procedure, benefits, complications and risks. She verbalized her understanding and wishes to proceed. I answered all of her questions. Patient has been NPO since 8 pm last evening.   -I discussed the plan in detail with Dr Festus Barren MD and he agrees with the plan.    [facility] 08/17/2022 10:15 AM

## 2022-08-17 NOTE — ED Provider Notes (Signed)
[facility] Provider Note    Event Date/Time   First MD Initiated Contact with Patient 08/17/22 [phone]     (approximate)   History   Shortness of Breath and Rectal Bleeding   HPI  Wanda Nelson is a 89 y.o. female with past med history of hypertension, hyperlipidemia, history of ventral hernia repair here with bright red blood per rectum.  The patient states that for the last 2 days, she has had persistent bright red bleeding from her rectum.  She has been passing small amount of clots.  She states that intermittently, she would have episodes where she passes 6-7 bowel movements per day and she did have this about 3 days ago.  Throughout the day yesterday and today, she has been passing bright red blood per rectum.  She been passing small clots.  Denies any fevers or chills.     Physical Exam   Triage Vital Signs: ED Triage Vitals  Encounter Vitals Group     BP 08/17/22 0643 (!) 186/95     Systolic BP Percentile --      Diastolic BP Percentile --      Pulse Rate 08/17/22 0643 82     Resp 08/17/22 0643 18     Temp --      Temp Source 08/17/22 0643 Oral     SpO2 08/17/22 0643 92 %     Weight 08/17/22 0641 100 lb (45.4 kg)     Height 08/17/22 0641 5\' 1"  (1.549 m)     Head Circumference --      Peak Flow --      Pain Score 08/17/22 0641 0     Pain Loc --      Pain Education --      Exclude from Growth Chart --     Most recent vital signs: Vitals:   08/17/22 0643  BP: (!) 186/95  Pulse: 82  Resp: 18  SpO2: 92%     General: Awake, no distress.  CV:  Good peripheral perfusion.  Resp:  Normal work of breathing.  Abd:  No distention.  Minimal left lower quadrant tenderness.  No rebound or guarding. Other:  Well-appearing in no distress.   ED Results / Procedures / Treatments   Labs (all labs ordered are listed, but only abnormal results are displayed) Labs Reviewed  COMPREHENSIVE METABOLIC PANEL  CBC  POC OCCULT BLOOD, ED  TYPE  AND SCREEN     EKG    RADIOLOGY Chest x-ray: Unremarkable CT angio: Diverticulitis with small area of active bleed   I also independently reviewed and agree with radiologist interpretations.   PROCEDURES:  Critical Care performed: No  .1-3 Lead EKG Interpretation  Performed by: Shaune Pollack, MD Authorized by: Shaune Pollack, MD     Interpretation: normal     ECG rate:  80-90   ECG rate assessment: normal     Rhythm: sinus rhythm     Ectopy: none     Conduction: normal   Comments:     Indication: GI bleed   MEDICATIONS ORDERED IN ED: Medications  piperacillin-tazobactam (ZOSYN) IVPB 3.375 g (has no administration in time range)  iohexol (OMNIPAQUE) 350 MG/ML injection 80 mL (80 mLs Intravenous Contrast Given 08/17/22 0757)     IMPRESSION / MDM / ASSESSMENT AND PLAN / ED COURSE  I reviewed the triage vital signs and the nursing notes.  Differential diagnosis includes, but is not limited to, internal/external hemorrhoids, diverticular bleed, diverticulitis, mesenteric ischemia, ischemic colitis  Patient's presentation is most consistent with acute presentation with potential threat to life or bodily function.  The patient is on the cardiac monitor to evaluate for evidence of arrhythmia and/or significant heart rate changes   89 year old female with past medical history as above here with bright red blood per rectum.  Patient is hemodynamically stable and hemoglobin is normal at 13.4.  No leukocytosis.  CMP is unremarkable.  CT of the abdomen and pelvis obtained, reviewed, shows area of diverticulitis with active bleed.  She is hemodynamically stable.  Discussed this with vascular surgery who will evaluate the patient, will start empiric antibiotics and plan admit to medicine.  Patient updated and is in agreement this plan.  No anticoagulant use.   FINAL CLINICAL IMPRESSION(S) / ED DIAGNOSES   Final diagnoses:  Acute diverticulitis   Lower GI bleed     Rx / DC Orders   ED Discharge Orders     None        Note:  This document was prepared using Dragon voice recognition software and may include unintentional dictation errors.   Shaune Pollack, MD 08/17/22 [phone]

## 2022-08-17 NOTE — H&P (View-Only) (Signed)
Hospital Consult    Reason for Consult:  GI Bleed Sigmoid Colon Requesting Physician:  Dr. Shaune Pollack MD MRN #:  [id number]  History of Present Illness: This is a 89 y.o. female with medical history significant of atrial myxoma status post excision 2020, large hiatal hernia status post repair 2023, HTN, HLD, PAF not on anticoagulations, presented with new onset of rectal bleeding. Symptoms started yesterday patient started to pass small amount of blood clot, bright red color, small amount, 2 times yesterday and 1 episode this morning with similar amount but denies any abdominal pain no nauseous vomiting. Denies any lightheadedness palpitations or chest pains. No history of lower GI bleed. At baseline patient eats healthy and she walks 3 miles a day, and she underwent endoscopic hiatal hernia repair November last year and had a normal left-sided cath in 2020.   On exam the patient is resting comfortably in the emergency department. No other complaints at this time. Patients vitals all remain stable. Patient states she has been NPO since 8 pm last evening.   Past Medical History:  Diagnosis Date   Actinic keratosis    Anemia    Aortic atherosclerosis (HCC)    Atrial myxoma 03/17/2018   a.) TTE 03/17/2018 --> 2.4 x 2.5 cm LEFT atrial mass attached at fossa ovalis/atrial septum; pedunculated in shape and solid in texture; b.) s/p atrial myoxma resection 03/21/2018; initially attempted via minimally invasive RIGHT thoracotomy approach, however due to bleeding complications, procedure required median sternotomy and patient going on cardiopulmonary bypass   Basal cell carcinoma 05/17/2021   L upper back 5.0 cm lat to spine - ED&C   Diastolic dysfunction    a.) TTE 03/17/2018: EF 55-60%, LVH, mild PR, G1DD; b.) TTE 07/25/2018: EF 60-65%, RVSP 44.9, mild-mod MR/TR, G1DD; c.) TTE 12/18/2018: EF 60-65%; d.) TTE 06/12/2019: EF 50-55, mild BAE, triv TR, PASP 37.5, G2DD; e.) TTE 07/22/2020: EF 60-65%,  mild MR, mod TR, RVSP 42.7, G1DD; f.) TTE 05/08/2021: EF 60-65%, mild LVH, mild-mod TR, AoV sclerosis without stenosis   Dyspnea    Fatigue 12/19/2021   GERD (gastroesophageal reflux disease)    Glaucoma    Hemorrhagic cerebrovascular accident (CVA) (HCC) 1990   Hiatal hernia    Hiatal hernia 05/16/2021   History of 2019 novel coronavirus disease (COVID-19)    a.) 05/30/20 - Tx'd with "covid 19 pill"; b.) 10/2021   History of blood transfusion    History of hiatal hernia    History of left atrial appendage closure 03/21/2018   a.) #45 Atricure   Hyperlipidemia    Hypertension    Hypoxia    Incidental pulmonary nodule, > 3mm and < 8mm 03/20/2018   Noted on CT scan - needs f/u imaging 6 months   Lung fibrosis (HCC) 11/16/2020   NSVT (nonsustained ventricular tachycardia) (HCC) 05/13/2018   a.) holter 05/13/2018 --> 5 short runs   PAF (paroxysmal atrial fibrillation) (HCC) 05/13/2018   a.) holter 05/13/2018 --> intermittent episodes with longest lasting approx 1.5 hours (burden <1%).   Pain due to onychomycosis of toenails of both feet 07/28/2020   Positive self-administered antigen test for COVID-19 10/18/2021   PSVT (paroxysmal supraventricular tachycardia) 05/13/2018   a.) holter 05/13/2018 --> frequent runs of SVT noted during study   Pulmonary fibrosis (HCC)    Squamous cell carcinoma of skin 11/12/2019   L distal dorsum forearm - ED&C    Viral URI 05/31/2020    Past Surgical History:  Procedure Laterality Date   BUNIONECTOMY  Bilateral    CLIPPING OF ATRIAL APPENDAGE  03/21/2018   Procedure: Clipping Of Atrial Appendage - using an AtriCure 45mm clip;  Surgeon: Purcell Nails, MD;  Location: [facility] OR;  Service: Open Heart Surgery;;   ESOPHAGOGASTRODUODENOSCOPY (EGD) WITH PROPOFOL N/A 09/06/2021   Procedure: ESOPHAGOGASTRODUODENOSCOPY (EGD) WITH PROPOFOL;  Surgeon: Wyline Mood, MD;  Location: [facility] ENDOSCOPY;  Service: Gastroenterology;  Laterality: N/A;   EXCISION OF ATRIAL  MYXOMA N/A 03/21/2018   Procedure: Excision Of Atrial Myxoma;  Surgeon: Purcell Nails, MD;  Location: [facility] OR;  Service: Open Heart Surgery;  Laterality: N/A;   RECTOCELE REPAIR     RIGHT/LEFT HEART CATH AND CORONARY ANGIOGRAPHY N/A 03/19/2018   Procedure: RIGHT/LEFT HEART CATH AND CORONARY ANGIOGRAPHY;  Surgeon: Iran Ouch, MD;  Location: [facility] INVASIVE CV LAB;  Service: Cardiovascular;  Laterality: N/A;   TEE WITHOUT CARDIOVERSION N/A 03/21/2018   Procedure: TRANSESOPHAGEAL ECHOCARDIOGRAM (TEE);  Surgeon: Purcell Nails, MD;  Location: [facility] OR;  Service: Open Heart Surgery;  Laterality: N/A;   TONSILLECTOMY     TOTAL ABDOMINAL HYSTERECTOMY  01/09/1980   XI ROBOTIC ASSISTED PARAESOPHAGEAL HERNIA REPAIR N/A 11/14/2021   Procedure: XI ROBOTIC ASSISTED PARAESOPHAGEAL HERNIA REPAIR, RNFA to assist;  Surgeon: Leafy Ro, MD;  Location: [facility] ORS;  Service: General;  Laterality: N/A;    Allergies  Allergen Reactions   Bee Venom Anaphylaxis and Hives   Codeine Other (See Comments)    Altered mental status Altered mental status   Morphine And Codeine     Altered mental status   Shellfish Allergy Anaphylaxis and Hives    Mainly shrimp   Bactrim [Sulfamethoxazole-Trimethoprim]     ? Reaction     Norvasc [Amlodipine]     2.5 leg edema    Nitrofurantoin Rash    Prior to Admission medications   Medication Sig Start Date End Date Taking? Authorizing Provider  acetaminophen (TYLENOL) 325 MG tablet Take 2 tablets (650 mg total) by mouth every 6 (six) hours as needed for mild pain (or Fever >/= 101). 05/09/21   Leeroy Bock, MD  Ascorbic Acid (VITAMIN C PO) Take by mouth daily at 12 noon.    [provider]  aspirin EC 81 MG tablet Take 1 tablet (81 mg total) by mouth daily. 05/16/21   McLean-Scocuzza, Pasty Spillers, MD  atorvastatin (LIPITOR) 20 MG tablet Take 1 tablet (20 mg total) by mouth daily at 6 PM. 10/17/21   McLean-Scocuzza, Pasty Spillers, MD  Calcium Carbonate-Vitamin D  500-125 MG-UNIT TABS Take 1 tablet by mouth daily.    [provider]  cholecalciferol (VITAMIN D) 1000 units tablet Take 1,000 Units by mouth daily.    [provider]  ipratropium-albuterol (DUONEB) 0.5-2.5 (3) MG/3ML SOLN INHALE THE CONTENTS OF ONE (1) VIAL VIA NEBULIZATION EVERY 6 HOURS AS NEEDED 08/15/21   Worthy Rancher B, FNP  loratadine (CLARITIN) 10 MG tablet Take 1 tablet (10 mg total) by mouth daily. As needed at night 10/17/21 10/12/22  McLean-Scocuzza, Pasty Spillers, MD  losartan (COZAAR) 25 MG tablet Take 0.5-1 tablets (12.5-25 mg total) by mouth 2 (two) times daily. If BP >140/>80 take losartan 25 mg bid 10/17/21   McLean-Scocuzza, Pasty Spillers, MD  Multiple Vitamin (MULTIVITAMIN) tablet Take 1 tablet by mouth daily.    [provider]  pantoprazole (PROTONIX) 40 MG tablet Take 1 tablet (40 mg total) by mouth 2 (two) times daily. 05/06/22   Dana Allan, MD  polyethylene glycol powder (GLYCOLAX/MIRALAX) 17 GM/SCOOP powder  Take 8.5 g by mouth daily. 05/15/22   Dana Allan, MD    Social History   Socioeconomic History   Marital status: Widowed    Spouse name: Not on file   Number of children: Not on file   Years of education: Not on file   Highest education level: Bachelor's degree (e.g., BA, AB, BS)  Occupational History   Not on file  Tobacco Use   Smoking status: Never   Smokeless tobacco: Never  Vaping Use   Vaping status: Never Used  Substance and Sexual Activity   Alcohol use: No    Alcohol/week: 0.0 standard drinks of alcohol   Drug use: No   Sexual activity: Not Currently  Other Topics Concern   Not on file  Social History Narrative   Lives [place],  husband died 12-06-18 due to brain hemorrhage   Daughter in law Luis Zepf    widowed   Social Determinants of Health   Financial Resource Strain: Low Risk  (05/17/2022)   Overall Financial Resource Strain (CARDIA)    Difficulty of Paying Living Expenses: Not hard at all  Food Insecurity: No Food  Insecurity (05/17/2022)   Hunger Vital Sign    Worried About Running Out of Food in the Last Year: Never true    Ran Out of Food in the Last Year: Never true  Transportation Needs: No Transportation Needs (05/17/2022)   PRAPARE - Administrator, Civil Service (Medical): No    Lack of Transportation (Non-Medical): No  Physical Activity: Sufficiently Active (05/17/2022)   Exercise Vital Sign    Days of Exercise per Week: 7 days    Minutes of Exercise per Session: 30 min  Stress: No Stress Concern Present (05/17/2022)   [employer] of Occupational Health - Occupational Stress Questionnaire    Feeling of Stress : Not at all  Social Connections: Socially Integrated (05/17/2022)   Social Connection and Isolation Panel [NHANES]    Frequency of Communication with Friends and Family: More than three times a week    Frequency of Social Gatherings with Friends and Family: More than three times a week    Attends Religious Services: More than 4 times per year    Active Member of [employer] or Organizations: Yes    Attends Engineer, structural: More than 4 times per year    Marital Status: Married  Catering manager Violence: Not At Risk (04/14/2020)   Humiliation, Afraid, Rape, and Kick questionnaire    Fear of Current or Ex-Partner: No    Emotionally Abused: No    Physically Abused: No    Sexually Abused: No     Family History  Problem Relation Age of Onset   Colon cancer Brother    Cancer Brother        neck tumor cancerous    Esophageal varices Brother    Colon cancer Maternal Uncle    Colon cancer Maternal Grandmother    Kidney disease Brother    Coronary artery disease Brother        with stent, cabg   Breast cancer Neg Hx     ROS: Otherwise negative unless mentioned in HPI  Physical Examination  Vitals:   08/17/22 0643 08/17/22 1001  BP: (!) 186/95 (!) 139/118  Pulse: 82 75  Resp: 18 (!) 24  Temp:  97.9 F (36.6 C)  SpO2: 92% 98%   Body mass index is 18.89  kg/m.  General:  WDWN in NAD Gait: Not observed HENT:  WNL, normocephalic Pulmonary: normal non-labored breathing, without Rales, rhonchi,  wheezing Cardiac: regular, without  Murmurs, rubs or gallops; without carotid bruits Abdomen: Positive bowel sounds,  soft, Tender on palpation/ND, no masses Skin: without rashes Vascular Exam/Pulses: Palpable Pulses throughout.  Extremities: without ischemic changes, without Gangrene , without cellulitis; without open wounds;  Musculoskeletal: no muscle wasting or atrophy  Neurologic: A&O X 3;  No focal weakness or paresthesias are detected; speech is fluent/normal Psychiatric:  The pt has Normal affect. Lymph:  Unremarkable  CBC    Component Value Date/Time   WBC 8.3 08/17/2022 0652   RBC 4.40 08/17/2022 0652   HGB 13.4 08/17/2022 0652   HCT 40.7 08/17/2022 0652   PLT 174 08/17/2022 0652   MCV 92.5 08/17/2022 0652   MCH 30.5 08/17/2022 0652   MCHC 32.9 08/17/2022 0652   RDW 13.4 08/17/2022 0652   LYMPHSABS 1.8 04/11/2022 1122   MONOABS 0.6 04/11/2022 1122   EOSABS 0.4 04/11/2022 1122   BASOSABS 0.1 04/11/2022 1122    BMET    Component Value Date/Time   NA 138 08/17/2022 0652   K 3.7 08/17/2022 0652   CL 104 08/17/2022 0652   CO2 24 08/17/2022 0652   GLUCOSE 94 08/17/2022 0652   BUN 10 08/17/2022 0652   CREATININE 0.79 08/17/2022 0652   CALCIUM 9.3 08/17/2022 0652   GFRNONAA >60 08/17/2022 0652   GFRAA >60 03/31/2018 0428    COAGS: Lab Results  Component Value Date   INR 1.4 (H) 03/21/2018   INR 1.0 03/20/2018     Non-Invasive Vascular Imaging:   EXAM:08/17/22 CT ABDOMEN AND PELVIS WITH CONTRAST   TECHNIQUE: Multidetector CT imaging of the abdomen and pelvis was performed using the standard protocol following bolus administration of intravenous contrast.   RADIATION DOSE REDUCTION: This exam was performed according to the departmental dose-optimization program which includes automated exposure control, adjustment  of the mA and/or kV according to patient size and/or use of iterative reconstruction technique.   CONTRAST:  85mL OMNIPAQUE IOHEXOL 300 MG/ML  SOLN   COMPARISON:  Noncontrast CT 03/31/2018   FINDINGS: Lower chest: Once again there heart is enlarged. There is diffuse interstitial septal thickening with ground-glass along bases. Please correlate for any known history of chronic lung disease. Patient is status post median sternotomy. There is a patulous contrast filled esophagus. Please correlate for history of fundoplication surgery as well.   Hepatobiliary: Dome right-sided hepatic cysts identified measuring 10 mm with Hounsfield unit of 14. No specific imaging follow-up. Series 2 image 9. The gallbladder is nondilated. Patent portal vein   Pancreas: Unremarkable. No pancreatic ductal dilatation or surrounding inflammatory changes.   Spleen: Rounded calcification seen the superior aspect of the spleen, nonspecific. Is also some heterogeneous enhancement of the spleen with a wedge-shaped area medial and superior along splenic parenchyma on series 2, image 10, nonspecific.   Adrenals/Urinary Tract: The adrenal glands are preserved. Kidneys has some small cysts. Example upper pole on the right measuring 11 mm with Hounsfield units of 17. Tiny focus as well upper pole left kidney. These consistent with Bosniak 1 and 2 lesions. No specific imaging follow-up. Preserved contours of the urinary bladder   Stomach/Bowel: Distended stomach with contrast and debris. The small and large bowel are nondilated. There is diffuse colonic stool. Left-sided colonic diverticulosis. Slightly atypical distribution of bowel with several loops of small bowel extending lateral to the ascending colon. Redundant course seen to the transverse colon extending into the low pelvis in the  cecum resides in the right upper quadrant.   Vascular/Lymphatic: Normal caliber aorta and IVC with some  scattered vascular calcifications. No discrete abnormal lymph node enlargement identified in the abdomen and pelvis.   Reproductive: Status post hysterectomy. No adnexal masses.   Other: No abdominal wall hernia or abnormality. No abdominopelvic ascites.   Musculoskeletal: Moderate degenerative changes of the spine. Trace retrolisthesis of L2-3 and L1-2. Curvature of the spine.   IMPRESSION: Distended stomach with fluid and debris of uncertain etiology. Please correlate with any clinical symptoms.   Left-sided colonic diverticula with moderate colonic stool. No bowel obstruction, free air or free fluid.   No abdominal wall or pelvic wall hernia.   Patulous esophagus with luminal contrast and air. Please correlate with any previous intervention.  Statin:  Yes.   Beta Blocker:  No. Aspirin:  Yes.   ACEI:  No. ARB:  Yes.   CCB use:  No Other antiplatelets/anticoagulants:  No.     ASSESSMENT/PLAN: This is a 89 y.o. female who presents to [facility] Emergency department with GI Bleeding. On work up with CT scan of the abdomen she is noted to have sigmoid colon bleeding.   PLAN: Vascular Surgery plans on taking the patient to the vascular lab later today for embolization of her GI Bleeding. I have discussed in detail with the patient the procedure, benefits, complications and risks. She verbalized her understanding and wishes to proceed. I answered all of her questions. Patient has been NPO since 8 pm last evening.   -I discussed the plan in detail with Dr Festus Barren MD and he agrees with the plan.    Marcie Bal [facility] 08/17/2022 10:15 AM

## 2022-08-18 ENCOUNTER — Inpatient Hospital Stay: Payer: Medicare Other

## 2022-08-18 DIAGNOSIS — I10 Essential (primary) hypertension: Secondary | ICD-10-CM | POA: Diagnosis not present

## 2022-08-18 DIAGNOSIS — K5733 Diverticulitis of large intestine without perforation or abscess with bleeding: Secondary | ICD-10-CM | POA: Diagnosis not present

## 2022-08-18 MED ORDER — SODIUM CHLORIDE 0.9 % IV SOLN: 12.5000 mg | Freq: Four times a day (QID) | INTRAVENOUS | Status: DC | PRN

## 2022-08-18 MED ORDER — VITAMIN C 500 MG PO TABS
500.0000 mg | ORAL_TABLET | Freq: Every day | ORAL | Status: DC
  Filled 2022-08-18 (×2): qty 1

## 2022-08-18 MED ORDER — HYDRALAZINE HCL 20 MG/ML IJ SOLN
5.0000 mg | Freq: Once | INTRAMUSCULAR | Status: AC
  Administered 2022-08-18: 5 mg via INTRAVENOUS
  Filled 2022-08-18: qty 1

## 2022-08-18 MED ORDER — ADULT MULTIVITAMIN W/MINERALS CH
1.0000 | ORAL_TABLET | Freq: Every day | ORAL | Status: DC
  Filled 2022-08-18 (×2): qty 1

## 2022-08-18 MED ORDER — SODIUM CHLORIDE 0.9 % IV SOLN: INTRAVENOUS | Status: DC | PRN

## 2022-08-18 MED ORDER — LORAZEPAM 2 MG/ML IJ SOLN
0.5000 mg | Freq: Once | INTRAMUSCULAR | Status: AC
  Administered 2022-08-18: 0.5 mg via INTRAVENOUS
  Filled 2022-08-18: qty 1

## 2022-08-18 MED ORDER — KETOROLAC TROMETHAMINE 15 MG/ML IJ SOLN: 15.0000 mg | Freq: Three times a day (TID) | INTRAMUSCULAR | Status: DC | PRN

## 2022-08-18 MED ORDER — DICYCLOMINE HCL 20 MG PO TABS
20.0000 mg | ORAL_TABLET | Freq: Once | ORAL | Status: AC
  Administered 2022-08-18: 20 mg via ORAL
  Filled 2022-08-18: qty 1

## 2022-08-18 MED ORDER — LOSARTAN POTASSIUM 25 MG PO TABS
12.5000 mg | ORAL_TABLET | Freq: Two times a day (BID) | ORAL | Status: DC
  Administered 2022-08-18 – 2022-08-19 (×4): 25 mg via ORAL
  Administered 2022-08-20: 12.5 mg via ORAL
  Filled 2022-08-18 (×5): qty 1

## 2022-08-18 NOTE — Assessment & Plan Note (Signed)
Likely diverticular bleed, hemoglobin stable.  S/p embolization by vascular surgery. Seems like improved. -Monitor hemoglobin

## 2022-08-18 NOTE — Progress Notes (Signed)
  Progress Note   Patient: Wanda Nelson QIH:[id number] DOB: 1931/12/28 DOA: 08/17/2022     1 DOS: the patient was seen and examined on 08/18/2022   Brief hospital course: Taken from H&P.   Hannalee Diddle Carvajal is a 89 y.o. female with medical history significant of atrial myxoma status post excision 2020, large hiatal hernia status post repair 2023, HTN, HLD, PAF not on anticoagulations, presented with new onset of rectal bleeding .  On presentation she was hemodynamically stable, hemoglobin of 13.4.  CTA of abdomen pelvis showed 2.5 cm long segment of proximal sigmoid colon shows signs of early/subacute diverticulitis, also reported signs of active bleeding in the same area.   GI was initially consulted and they recommend vascular surgery consult as colonoscopy will be contraindicated.  Vascular surgery was consulted and patient was taken to vascular lab s/p embolization of 2 main sigmoidal branches of IMA.  Patient was also started on Zosyn for concern of diverticulitis.  8/10: Vitals and hemoglobin stable.  Leukocytosis.  Continue to have some left lower quadrant pain.  Patient was concerned that it might be due to kidney stone due to her prior history, CT renal stone protocol was obtained and it was without any evidence of urolithiasis, hydronephrosis or other acute abnormalities.    Assessment and Plan: * Lower GI bleed Likely diverticular bleed, hemoglobin stable.  S/p embolization by vascular surgery. Seems like improved. -Monitor hemoglobin   Diverticulitis large intestine CT concerning for diverticulitis. -Continue with Zosyn  Essential hypertension Blood pressure elevated today. -Restarting home losartan  HLD (hyperlipidemia) -Continue home statin   Subjective: Patient was complaining of left lower quadrant pain, stating that it feels like her prior kidney stone pain.  No more bleeding.  No nausea or vomiting.  Physical Exam: Vitals:   08/18/22 0245  08/18/22 0513 08/18/22 0843 08/18/22 1403  BP: (!) 156/76 (!) 146/72 (!) 168/82   Pulse: 98 98 (!) 108   Resp: (!) 22 17 20    Temp: 97.9 F (36.6 C) 99.2 F (37.3 C) 98.7 F (37.1 C)   TempSrc: Oral  Oral   SpO2: 98% 97% 96% 97%  Weight:      Height:       General.  Frail and malnourished elderly lady, in no acute distress. Pulmonary.  Lungs clear bilaterally, normal respiratory effort. CV.  Regular rate and rhythm, no JVD, rub or murmur. Abdomen.  Soft, mild LLQ tenderness, nondistended, BS positive. CNS.  Alert and oriented .  No focal neurologic deficit. Extremities.  No edema, no cyanosis, pulses intact and symmetrical.  Data Reviewed: Prior data reviewed  Family Communication: Talked with daughter on phone.  Disposition: Status is: Inpatient Remains inpatient appropriate because: Severity of illness.  Planned Discharge Destination: Home  Time spent: 45 minutes  This record has been created using Conservation officer, historic buildings. Errors have been sought and corrected,but may not always be located. Such creation errors do not reflect on the standard of care.   Author: Arnetha Courser, MD 08/18/2022 2:23 PM  For on call review [email or web address].

## 2022-08-18 NOTE — Assessment & Plan Note (Signed)
-   Continue home statin °

## 2022-08-18 NOTE — Hospital Course (Addendum)
Taken from H&P.   Wanda Nelson is a 89 y.o. female with medical history significant of atrial myxoma status post excision 2020, large hiatal hernia status post repair 2023, HTN, HLD, PAF not on anticoagulations, presented with new onset of rectal bleeding .  On presentation she was hemodynamically stable, hemoglobin of 13.4.  CTA of abdomen pelvis showed 2.5 cm long segment of proximal sigmoid colon shows signs of early/subacute diverticulitis, also reported signs of active bleeding in the same area.   GI was initially consulted and they recommend vascular surgery consult as colonoscopy will be contraindicated.  Vascular surgery was consulted and patient was taken to vascular lab s/p embolization of 2 main sigmoidal branches of IMA.  Patient was also started on Zosyn for concern of diverticulitis.  8/10: Vitals and hemoglobin stable.  Leukocytosis.  Continue to have some left lower quadrant pain.  Patient was concerned that it might be due to kidney stone due to her prior history, CT renal stone protocol was obtained and it was without any evidence of urolithiasis, hydronephrosis or other acute abnormalities.  8/11: No significant pain today, no nausea or vomiting and started tolerating clear liquid diet. Diet advanced to full liquid. PT/OT evaluation ordered  8/12: Remained hemodynamically stable.  Pain and other symptoms resolved.  Tolerating GI soft diet.  PT was recommending home health but patient declined.  Patient lives in [place] independent living and can get some services from there as needed.  She is being discharged on 2 more days of ciprofloxacin and Flagyl to complete the course for concern of diverticulitis.  Patient need to follow-up with vascular surgery for postprocedure follow-up. Patient will also follow-up with GI as outpatient for further recommendations.  Patient will continue current medications and need to have a close follow-up with her providers for  further management.

## 2022-08-18 NOTE — Progress Notes (Signed)
       CROSS COVER NOTE  NAME: Wanda Nelson MRN: [id number] DOB : [birth year]    Concern as stated by nurse / staff   Patient with shortness of breath and increased work of breathing and elevated BP 167/90. Breathing improved after breathing treatment Abdominal pain persists    Pertinent findings on chart review: reviewed  Assessment and  Interventions   Assessment:    08/18/2022    5:13 AM 08/18/2022    2:45 AM 08/18/2022    2:09 AM  Vitals with BMI  Systolic 146 156 782  Diastolic 72 76 90  Pulse 98 98 105   Hemoglobin stable Plan: Bentyl 10 mg x1       Donnie Mesa NP [facility] Cross Cover 7pm-7am - check amion for availability Pager [phone]

## 2022-08-18 NOTE — Plan of Care (Signed)

## 2022-08-18 NOTE — Assessment & Plan Note (Signed)
CT concerning for diverticulitis. -Continue with Zosyn

## 2022-08-18 NOTE — Progress Notes (Signed)
   08/18/22 1427  Assess: MEWS Score  Temp 98.2 F (36.8 C)  BP (!) 160/60  MAP (mmHg) 89  Pulse Rate 90  Resp (!) 32  SpO2 96 %  O2 Device Room Air  Assess: MEWS Score  MEWS Temp 0  MEWS Systolic 0  MEWS Pulse 0  MEWS RR 2  MEWS LOC 0  MEWS Score 2  MEWS Score Color Yellow  Assess: if the MEWS score is Yellow or Red  Were vital signs accurate and taken at a resting state? Yes  Does the patient meet 2 or more of the SIRS criteria? No  Does the patient have a confirmed or suspected source of infection? Yes  MEWS guidelines implemented  Yes, yellow  Treat  MEWS Interventions Considered administering scheduled or prn medications/treatments as ordered  Take Vital Signs  Increase Vital Sign Frequency  Yellow: Q2hr x1, continue Q4hrs until patient remains green for 12hrs  Escalate  MEWS: Escalate Yellow: Discuss with charge nurse and consider notifying provider and/or RRT  Notify: Charge Nurse/RN  Name of Charge Nurse/RN Notified Wenda Overland, RN  Provider Notification  Provider Name/Title Arnetha Courser, MD  Date Provider Notified 08/18/22  Time Provider Notified 1430  Notification Reason Change in status  Provider response See new orders  Date of Provider Response 08/18/22  Time of Provider Response 1443  Assess: SIRS CRITERIA  SIRS Temperature  0  SIRS Pulse 0  SIRS Respirations  1  SIRS WBC 0  SIRS Score Sum  1

## 2022-08-18 NOTE — Progress Notes (Signed)
   08/18/22 0209  Assess: MEWS Score  Temp 97.8 F (36.6 C)  BP (!) 167/90  MAP (mmHg) 111  Pulse Rate (!) 105  Resp (!) 24  SpO2 97 %  O2 Device Room Air  Assess: MEWS Score  MEWS Temp 0  MEWS Systolic 0  MEWS Pulse 1  MEWS RR 1  MEWS LOC 0  MEWS Score 2  MEWS Score Color Yellow  Assess: if the MEWS score is Yellow or Red  Were vital signs accurate and taken at a resting state? Yes  Does the patient meet 2 or more of the SIRS criteria? Yes  Does the patient have a confirmed or suspected source of infection? Yes  Notify: Charge Nurse/RN  Name of Charge Nurse/RN Notified TKing RN  Provider Notification  Provider Name/Title BMorrison  Date Provider Notified 08/18/22  Time Provider Notified [phone]  Method of Notification Page (chat)  Notification Reason Change in status  Provider response No new orders  Date of Provider Response 08/18/22  Time of Provider Response 0233  Assess: SIRS CRITERIA  SIRS Temperature  0  SIRS Pulse 1  SIRS Respirations  1  SIRS WBC 0  SIRS Score Sum  2

## 2022-08-18 NOTE — Assessment & Plan Note (Addendum)
Blood pressure elevated today. -Restarting home losartan

## 2022-08-19 DIAGNOSIS — I10 Essential (primary) hypertension: Secondary | ICD-10-CM | POA: Diagnosis not present

## 2022-08-19 DIAGNOSIS — K5733 Diverticulitis of large intestine without perforation or abscess with bleeding: Secondary | ICD-10-CM | POA: Diagnosis not present

## 2022-08-19 LAB — BASIC METABOLIC PANEL WITH GFR
Anion gap: 11 (ref 5–15)
BUN: 21 mg/dL (ref 8–23)
CO2: 22 mmol/L (ref 22–32)
Calcium: 8.5 mg/dL — ABNORMAL LOW (ref 8.9–10.3)
Chloride: 104 mmol/L (ref 98–111)
Creatinine, Ser: 0.67 mg/dL (ref 0.44–1.00)
GFR, Estimated: >60 mL/min (ref >60)
Glucose, Bld: 116 mg/dL — ABNORMAL HIGH (ref 70–99)
Potassium: 3.6 mmol/L (ref 3.5–5.1)
Sodium: 137 mmol/L (ref 135–145)

## 2022-08-19 LAB — CBC
HCT: 36.5 % (ref 36.0–46.0)
Hemoglobin: 12.5 g/dL (ref 12.0–15.0)
MCH: 30.7 pg (ref 26.0–34.0)
MCHC: 34.2 g/dL (ref 30.0–36.0)
MCV: 89.7 fL (ref 80.0–100.0)
Platelets: 134 10*3/uL — ABNORMAL LOW (ref 150–400)
RBC: 4.07 MIL/uL (ref 3.87–5.11)
RDW: 13.6 % (ref 11.5–15.5)
WBC: 23 10*3/uL — ABNORMAL HIGH (ref 4.0–10.5)
nRBC: 0 % (ref 0.0–0.2)

## 2022-08-19 NOTE — Assessment & Plan Note (Signed)
Likely diverticular bleed, hemoglobin stable.  S/p embolization by vascular surgery. Seems like improved. -Monitor hemoglobin

## 2022-08-19 NOTE — Assessment & Plan Note (Signed)
Blood pressure mildly elevated -Continue home losartan

## 2022-08-19 NOTE — TOC Initial Note (Signed)
Transition of Care [facility]) - Initial/Assessment Note    Patient Details  Name: Wanda Nelson MRN: [id number] Date of Birth: 24-Aug-1931  Transition of Care [facility]) CM/SW Contact:    Kemper Durie, RN Phone Number: 08/19/2022, 3:22 PM  Clinical Narrative:                  Admitted from [facility], independent living.  State Dr. Clent Ridges is primary provider, obtains medications from [facility].  Report her late husband was disabled, so she has lots of DME if she needs it.  Son and daughter in law will provide transportation home when ready for discharge.    Expected Discharge Plan: Home/Self Care Barriers to Discharge: Continued Medical Work up   Patient Goals and CMS Choice            Expected Discharge Plan and Services       Living arrangements for the past 2 months: Independent Living Facility                                      Prior Living Arrangements/Services Living arrangements for the past 2 months: Independent Living Facility Lives with:: Facility Resident Patient language and need for interpreter reviewed:: Yes        Need for Family Participation in Patient Care: Yes (Comment)   Current home services: DME Criminal Activity/Legal Involvement Pertinent to Current Situation/Hospitalization: No - Comment as needed  Activities of Daily Living Home Assistive Devices/Equipment: Eyeglasses, Hearing aid ADL Screening (condition at time of admission) Patient's cognitive ability adequate to safely complete daily activities?: Yes Is the patient deaf or have difficulty hearing?: No Does the patient have difficulty seeing, even when wearing glasses/contacts?: No Does the patient have difficulty concentrating, remembering, or making decisions?: No Patient able to express need for assistance with ADLs?: Yes Does the patient have difficulty dressing or bathing?: No Independently performs ADLs?: Yes (appropriate for developmental age) Does the patient  have difficulty walking or climbing stairs?: No Weakness of Legs: None Weakness of Arms/Hands: None  Permission Sought/Granted                  Emotional Assessment Appearance:: Appears stated age Attitude/Demeanor/Rapport: Engaged, Gracious Affect (typically observed): Calm Orientation: : Oriented to Self, Oriented to Place, Oriented to  Time, Oriented to Situation   Psych Involvement: No (comment)  Admission diagnosis:  GI bleed [K92.2] Lower GI bleed [K92.2] Acute diverticulitis [K57.92] Patient Active Problem List   Diagnosis Date Noted   Diverticulitis large intestine 08/17/2022   Lower GI bleed 08/17/2022   Mesenteric arterial embolization (HCC) 08/17/2022   Constipation 05/06/2022   Weight loss, non-intentional 04/15/2022   Hernia of abdominal wall 04/15/2022   Need for pneumococcal 20-valent conjugate vaccination 01/20/2022   S/P repair of paraesophageal hernia 11/14/2021   Paraesophageal hernia    Allergic rhinitis 05/16/2021   Pulmonary fibrosis (HCC) 05/08/2021   Essential hypertension    DOE (dyspnea on exertion)    Hearing loss of left ear 11/16/2020   Aortic atherosclerosis (HCC) 11/16/2020   Iron deficiency anemia 07/31/2018   Laryngopharyngeal reflux disease 07/31/2018   Atherosclerosis 07/31/2018   Lung nodule 03/20/2018   s/p resection left atrial myxoma 03/19/2018   HLD (hyperlipidemia) 03/15/2015   History of stroke 03/15/2015   Glaucoma 03/15/2015   PCP:  Dana Allan, MD Pharmacy:   [address] [place], [place] - [address] [place] [address] Phone: [phone] Fax: [phone]  [facility] - [place], [place] - [address] [place] [address] Phone: [phone] Fax: [phone]  [id number] - [place], [place] - [address] [place] [address] Phone: [phone] Fax: [phone]     Social Determinants of Health (SDOH) Social  History: SDOH Screenings   Food Insecurity: No Food Insecurity (08/17/2022)  Housing: Low Risk  (08/17/2022)  Transportation Needs: No Transportation Needs (08/17/2022)  Utilities: Not At Risk (08/17/2022)  Alcohol Screen: Low Risk  (05/17/2022)  Depression (PHQ2-9): Low Risk  (05/15/2022)  Financial Resource Strain: Low Risk  (05/17/2022)  Physical Activity: Sufficiently Active (05/17/2022)  Social Connections: Socially Integrated (05/17/2022)  Stress: No Stress Concern Present (05/17/2022)  Tobacco Use: Low Risk  (08/17/2022)   SDOH Interventions:     Readmission Risk Interventions     No data to display

## 2022-08-19 NOTE — Progress Notes (Signed)
  Progress Note   Patient: Wanda Nelson DVV:[id number] DOB: 07/24/1931 DOA: 08/17/2022     2 DOS: the patient was seen and examined on 08/19/2022   Brief hospital course: Taken from H&P.   Selenia Noone Laneve is a 89 y.o. female with medical history significant of atrial myxoma status post excision 2020, large hiatal hernia status post repair 2023, HTN, HLD, PAF not on anticoagulations, presented with new onset of rectal bleeding .  On presentation she was hemodynamically stable, hemoglobin of 13.4.  CTA of abdomen pelvis showed 2.5 cm long segment of proximal sigmoid colon shows signs of early/subacute diverticulitis, also reported signs of active bleeding in the same area.   GI was initially consulted and they recommend vascular surgery consult as colonoscopy will be contraindicated.  Vascular surgery was consulted and patient was taken to vascular lab s/p embolization of 2 main sigmoidal branches of IMA.  Patient was also started on Zosyn for concern of diverticulitis.  8/10: Vitals and hemoglobin stable.  Leukocytosis.  Continue to have some left lower quadrant pain.  Patient was concerned that it might be due to kidney stone due to her prior history, CT renal stone protocol was obtained and it was without any evidence of urolithiasis, hydronephrosis or other acute abnormalities.  8/11: No significant pain today, no nausea or vomiting and started tolerating clear liquid diet. Diet advanced to full liquid.  PT/OT evaluation ordered   Assessment and Plan: * Lower GI bleed Likely diverticular bleed, hemoglobin stable.  S/p embolization by vascular surgery. Seems like improved. -Monitor hemoglobin   Diverticulitis large intestine CT concerning for diverticulitis.  Started tolerating clear liquid now -Continue with Zosyn -Diet advanced to full liquid -PT/OT evaluation  Essential hypertension Blood pressure mildly elevated -Continue home losartan  HLD  (hyperlipidemia) -Continue home statin   Subjective: Patient with much improved pain today, no nausea or vomiting after taking clear liquid this morning.  Physical Exam: Vitals:   08/18/22 2356 08/19/22 0409 08/19/22 0600 08/19/22 1037  BP: (!) 159/76 (!) 159/79 (!) 156/82 (!) 156/82  Pulse: 83 85 87 91  Resp: (!) 32 (!) 30 (!) 24   Temp: 99 F (37.2 C) 98.5 F (36.9 C)  97.9 F (36.6 C)  TempSrc:    Oral  SpO2: 94% 94%  95%  Weight:      Height:       General.  Frail and malnourished elderly lady, in no acute distress. Pulmonary.  Lungs clear bilaterally, normal respiratory effort. CV.  Regular rate and rhythm, no JVD, rub or murmur. Abdomen.  Soft, nontender, nondistended, BS positive. CNS.  Alert and oriented .  No focal neurologic deficit. Extremities.  No edema, no cyanosis, pulses intact and symmetrical. Psychiatry.  Judgment and insight appears normal.   Data Reviewed: Prior data reviewed  Family Communication: Discussed with son and DIL at bedside  Disposition: Status is: Inpatient Remains inpatient appropriate because: Severity of illness.  Planned Discharge Destination: Home  Time spent: 42 minutes  This record has been created using Conservation officer, historic buildings. Errors have been sought and corrected,but may not always be located. Such creation errors do not reflect on the standard of care.   Author: Arnetha Courser, MD 08/19/2022 3:48 PM  For on call review [email or web address].

## 2022-08-19 NOTE — Plan of Care (Signed)

## 2022-08-19 NOTE — Assessment & Plan Note (Addendum)
CT concerning for diverticulitis.  Started tolerating clear liquid now -Continue with Zosyn -Diet advanced to full liquid -PT/OT evaluation

## 2022-08-20 ENCOUNTER — Encounter: Payer: Self-pay | Admitting: Vascular Surgery

## 2022-08-20 DIAGNOSIS — K5792 Diverticulitis of intestine, part unspecified, without perforation or abscess without bleeding: Secondary | ICD-10-CM | POA: Diagnosis not present

## 2022-08-20 DIAGNOSIS — E7800 Pure hypercholesterolemia, unspecified: Secondary | ICD-10-CM

## 2022-08-20 DIAGNOSIS — I10 Essential (primary) hypertension: Secondary | ICD-10-CM | POA: Diagnosis not present

## 2022-08-20 DIAGNOSIS — K55059 Acute (reversible) ischemia of intestine, part and extent unspecified: Secondary | ICD-10-CM | POA: Diagnosis not present

## 2022-08-20 LAB — CBC
HCT: 35.6 % — ABNORMAL LOW (ref 36.0–46.0)
Hemoglobin: 12.2 g/dL (ref 12.0–15.0)
MCH: 31.1 pg (ref 26.0–34.0)
MCHC: 34.3 g/dL (ref 30.0–36.0)
MCV: 90.8 fL (ref 80.0–100.0)
Platelets: 135 10*3/uL — ABNORMAL LOW (ref 150–400)
RBC: 3.92 MIL/uL (ref 3.87–5.11)
RDW: 13.8 % (ref 11.5–15.5)
WBC: 20.7 10*3/uL — ABNORMAL HIGH (ref 4.0–10.5)
nRBC: 0 % (ref 0.0–0.2)

## 2022-08-20 MED ORDER — METRONIDAZOLE 500 MG PO TABS: 500.0000 mg | ORAL_TABLET | Freq: Two times a day (BID) | ORAL | 0 refills | Status: AC

## 2022-08-20 MED ORDER — CIPROFLOXACIN HCL 500 MG PO TABS: 500.0000 mg | ORAL_TABLET | Freq: Two times a day (BID) | ORAL | Status: DC

## 2022-08-20 MED ORDER — RISAQUAD PO CAPS: 1.0000 | ORAL_CAPSULE | Freq: Three times a day (TID) | ORAL | 0 refills | Status: DC

## 2022-08-20 MED ORDER — METRONIDAZOLE 500 MG PO TABS
500.0000 mg | ORAL_TABLET | Freq: Two times a day (BID) | ORAL | Status: DC
  Administered 2022-08-20: 500 mg via ORAL
  Filled 2022-08-20: qty 1

## 2022-08-20 MED ORDER — CIPROFLOXACIN HCL 500 MG PO TABS: 500.0000 mg | ORAL_TABLET | Freq: Two times a day (BID) | ORAL | 0 refills | Status: AC

## 2022-08-20 NOTE — Progress Notes (Signed)
Pharmacy Antibiotic Note  Wanda Nelson is a 89 y.o. female admitted on 08/17/2022 with  diverticulitis .  Pharmacy has been consulted for zosyn dosing. -SHOB, rectal bleeding  Plan: Continue Zosyn 3.375g IV q8h (4 hour infusion).   Height: 5\' 1"  (154.9 cm) Weight: 45.4 kg (100 lb) IBW/kg (Calculated) : 47.8  Temp (24hrs), Avg:98.1 F (36.7 C), Min:97.9 F (36.6 C), Max:98.4 F (36.9 C)  Recent Labs  Lab 08/17/22 0652 08/18/22 0457 08/19/22 0446 08/20/22 0841  WBC 8.3 16.5* 23.0* 20.7*  CREATININE 0.79  --  0.67  --     Estimated Creatinine Clearance: 34.2 mL/min (by C-G formula based on SCr of 0.67 mg/dL).    Allergies  Allergen Reactions   Bee Venom Anaphylaxis and Hives   Codeine Other (See Comments)    Altered mental status Altered mental status   Morphine And Codeine     Altered mental status   Shellfish Allergy Anaphylaxis and Hives    Mainly shrimp   Bactrim [Sulfamethoxazole-Trimethoprim]     ? Reaction     Norvasc [Amlodipine]     2.5 leg edema    Nitrofurantoin Rash    Antimicrobials this admission: zosyn 8/9 >>       Dose adjustments this admission: N/A  Microbiology results: N/A  Thank you for allowing pharmacy to be a part of this patient's care.  Barrie Folk, PharmD 08/20/2022 10:06 AM

## 2022-08-20 NOTE — Plan of Care (Signed)
  Problem: Skin Integrity: Goal: Risk for impaired skin integrity will decrease Outcome: Progressing   Problem: Safety: Goal: Ability to remain free from injury will improve Outcome: Progressing   Problem: Pain Managment: Goal: General experience of comfort will improve Outcome: Progressing   

## 2022-08-20 NOTE — Care Management Important Message (Signed)
Important Message  Patient Details  Name: Wanda Nelson MRN: [id number] Date of Birth: [birth year]   Medicare Important Message Given:  Yes     Johnell Comings 08/20/2022, 10:58 AM

## 2022-08-20 NOTE — Discharge Summary (Signed)
Physician Discharge Summary   Patient: Wanda Nelson MRN: [id number] DOB: Jan 25, 1931  Admit date:     08/17/2022  Discharge date: 08/20/22  Discharge Physician: Arnetha Courser   PCP: Dana Allan, MD   Recommendations at discharge:  Please obtain CBC and BMP in 1 week Follow-up with vascular surgery Follow-up with gastroenterology Follow-up with primary care provider  Discharge Diagnoses: Principal Problem:   Lower GI bleed Active Problems:   Acute diverticulitis   Essential hypertension   HLD (hyperlipidemia)   Mesenteric arterial embolization [facility])   Hospital Course: Taken from H&P.   Wanda Nelson is a 89 y.o. female with medical history significant of atrial myxoma status post excision 2020, large hiatal hernia status post repair 2023, HTN, HLD, PAF not on anticoagulations, presented with new onset of rectal bleeding .  On presentation she was hemodynamically stable, hemoglobin of 13.4.  CTA of abdomen pelvis showed 2.5 cm long segment of proximal sigmoid colon shows signs of early/subacute diverticulitis, also reported signs of active bleeding in the same area.   GI was initially consulted and they recommend vascular surgery consult as colonoscopy will be contraindicated.  Vascular surgery was consulted and patient was taken to vascular lab s/p embolization of 2 main sigmoidal branches of IMA.  Patient was also started on Zosyn for concern of diverticulitis.  8/10: Vitals and hemoglobin stable.  Leukocytosis.  Continue to have some left lower quadrant pain.  Patient was concerned that it might be due to kidney stone due to her prior history, CT renal stone protocol was obtained and it was without any evidence of urolithiasis, hydronephrosis or other acute abnormalities.  8/11: No significant pain today, no nausea or vomiting and started tolerating clear liquid diet. Diet advanced to full liquid. PT/OT evaluation ordered  8/12: Remained hemodynamically  stable.  Pain and other symptoms resolved.  Tolerating GI soft diet.  PT was recommending home health but patient declined.  Patient lives in [facility] and can get some services from there as needed.  She is being discharged on 2 more days of ciprofloxacin and Flagyl to complete the course for concern of diverticulitis.  Patient need to follow-up with vascular surgery for postprocedure follow-up. Patient will also follow-up with GI as outpatient for further recommendations.  Patient will continue current medications and need to have a close follow-up with her providers for further management.  Assessment and Plan: * Lower GI bleed Likely diverticular bleed, hemoglobin stable.  S/p embolization by vascular surgery. Seems like improved. -Monitor hemoglobin   Acute diverticulitis CT concerning for diverticulitis.  Started tolerating clear liquid now -Continue with Zosyn -Diet advanced to full liquid -PT/OT evaluation  Essential hypertension Blood pressure mildly elevated -Continue home losartan  HLD (hyperlipidemia) -Continue home statin   Consultants: Gastroenterology, vascular surgery Procedures performed: Embolization of sigmoidal branches of IMA Disposition: Home Diet recommendation:  Discharge Diet Orders (From admission, onward)     Start     Ordered   08/20/22 0000  Diet - low sodium heart healthy        08/20/22 1319           Cardiac diet DISCHARGE MEDICATION: Allergies as of 08/20/2022       Reactions   Bee Venom Anaphylaxis, Hives   Codeine Other (See Comments)   Altered mental status Altered mental status   Morphine And Codeine    Altered mental status   Shellfish Allergy Anaphylaxis, Hives   Mainly shrimp   Bactrim [sulfamethoxazole-trimethoprim]    ?  Reaction    Norvasc [amlodipine]    2.5 leg edema   Nitrofurantoin Rash        Medication List     TAKE these medications    acetaminophen 325 MG tablet Commonly known  as: TYLENOL Take 2 tablets (650 mg total) by mouth every 6 (six) hours as needed for mild pain (or Fever >/= 101).   acidophilus Caps capsule Take 1 capsule by mouth 3 (three) times daily.   aspirin EC 81 MG tablet Take 1 tablet (81 mg total) by mouth daily.   atorvastatin 20 MG tablet Commonly known as: LIPITOR Take 1 tablet (20 mg total) by mouth daily at 6 PM.   Calcium Carbonate-Vitamin D 500-125 MG-UNIT Tabs Take 1 tablet by mouth daily.   cholecalciferol 1000 units tablet Commonly known as: VITAMIN D Take 1,000 Units by mouth daily.   ciprofloxacin 500 MG tablet Commonly known as: CIPRO Take 1 tablet (500 mg total) by mouth 2 (two) times daily for 4 doses.   ipratropium-albuterol 0.5-2.5 (3) MG/3ML Soln Commonly known as: DUONEB INHALE THE CONTENTS OF ONE (1) VIAL VIA NEBULIZATION EVERY 6 HOURS AS NEEDED   loratadine 10 MG tablet Commonly known as: CLARITIN Take 1 tablet (10 mg total) by mouth daily. As needed at night   losartan 25 MG tablet Commonly known as: COZAAR Take 0.5-1 tablets (12.5-25 mg total) by mouth 2 (two) times daily. If BP >140/>80 take losartan 25 mg bid   metroNIDAZOLE 500 MG tablet Commonly known as: FLAGYL Take 1 tablet (500 mg total) by mouth every 12 (twelve) hours for 4 doses.   multivitamin tablet Take 1 tablet by mouth daily.   pantoprazole 40 MG tablet Commonly known as: Protonix Take 1 tablet (40 mg total) by mouth 2 (two) times daily.   polyethylene glycol powder 17 GM/SCOOP powder Commonly known as: GLYCOLAX/MIRALAX Take 8.5 g by mouth daily.   sucralfate 1 g tablet Commonly known as: CARAFATE Take 1 g by mouth 4 (four) times daily -  with meals and at bedtime.   VITAMIN C PO Take by mouth daily at 12 noon.        Follow-up Information     Dana Allan, MD. Schedule an appointment as soon as possible for a visit in 1 week(s).   Specialty: Family Medicine Contact information: [address] [place]  [address] [phone]         Annice Needy, MD. Schedule an appointment as soon as possible for a visit in 1 week(s).   Specialties: Vascular Surgery, Radiology, Interventional Cardiology Contact information: [address] [place] [address] [phone]         Midge Minium, MD. Schedule an appointment as soon as possible for a visit in 1 week(s).   Specialty: Gastroenterology Contact information: [address] [place]  [place] [address] [phone]                Discharge Exam: Ceasar Mons Weights   08/17/22 0641 08/17/22 1001  Weight: 45.4 kg 45.4 kg   General.  Frail elderly lady, in no acute distress. Pulmonary.  Lungs clear bilaterally, normal respiratory effort. CV.  Regular rate and rhythm, no JVD, rub or murmur. Abdomen.  Soft, nontender, nondistended, BS positive. CNS.  Alert and oriented .  No focal neurologic deficit. Extremities.  No edema, no cyanosis, pulses intact and symmetrical. Psychiatry.  Judgment and insight appears normal.   Condition at discharge: stable  The results of significant diagnostics from  this hospitalization (including imaging, microbiology, ancillary and laboratory) are listed below for reference.   Imaging Studies: PERIPHERAL VASCULAR CATHETERIZATION  Result Date: 08/20/2022 See surgical note for result.  DG Chest 2 View  Result Date: 08/18/2022 CLINICAL DATA:  Shortness of breath. EXAM: CHEST - 2 VIEW COMPARISON:  Chest radiograph dated 08/17/2022. FINDINGS: Diffuse chronic antral coarsening. No consolidative changes. There is no pleural effusion or pneumothorax. Stable cardiac silhouette. Left atrial appendage occlusive device. Median sternotomy wires. No acute osseous pathology. IMPRESSION: No active cardiopulmonary disease.  Chronic interstitial coarsening. Electronically Signed   By: Elgie Collard M.D.   On: 08/18/2022 18:58   CT RENAL STONE STUDY  Result Date: 08/18/2022 CLINICAL DATA:  Abdominal and  flank pain.  Suspected renal stone. EXAM: CT ABDOMEN AND PELVIS WITHOUT CONTRAST TECHNIQUE: Multidetector CT imaging of the abdomen and pelvis was performed following the standard protocol without IV contrast. RADIATION DOSE REDUCTION: This exam was performed according to the departmental dose-optimization program which includes automated exposure control, adjustment of the mA and/or kV according to patient size and/or use of iterative reconstruction technique. COMPARISON:  08/17/2022 FINDINGS: Lower chest: No acute findings. Chronic bibasilar interstitial lung disease again noted, suspicious for pulmonary fibrosis. Hepatobiliary: No mass visualized on this unenhanced exam. Small cyst again seen in the anterior liver dome. Vicarious excretion of contrast noted within the gallbladder, however there is no No evidence of cholecystitis or biliary ductal dilatation. Pancreas: No mass or inflammatory process visualized on this unenhanced exam. Spleen:  Within normal limits in size. Adrenals/Urinary tract: No evidence of urolithiasis or hydronephrosis. Contrast is seen filling the urinary bladder from recent CTA. Stomach/Bowel: Small hiatal hernia again noted. No evidence of obstruction, inflammatory process, or abnormal fluid collections. Diverticulosis is seen mainly involving the descending and sigmoid colon, however there is no evidence of diverticulitis. Vascular/Lymphatic: No pathologically enlarged lymph nodes identified. No evidence of abdominal aortic aneurysm. Reproductive: Prior hysterectomy noted. Adnexal regions are unremarkable in appearance. Other:  None. Musculoskeletal:  No suspicious bone lesions identified. IMPRESSION: No evidence of urolithiasis, hydronephrosis, or other acute findings. Colonic diverticulosis, without radiographic evidence of diverticulitis. Stable small hiatal hernia. Electronically Signed   By: Danae Orleans M.D.   On: 08/18/2022 13:54   CT ANGIO GI BLEED  Result Date:  08/17/2022 CLINICAL DATA:  Abdominal bleeding, BRBPR, eval diverticulosis, h/o hiatal hernia EXAM: CTA ABDOMEN AND PELVIS WITHOUT AND WITH CONTRAST TECHNIQUE: Multidetector CT imaging of the abdomen and pelvis was performed using the standard protocol during bolus administration of intravenous contrast. Multiplanar reconstructed images and MIPs were obtained and reviewed to evaluate the vascular anatomy. RADIATION DOSE REDUCTION: This exam was performed according to the departmental dose-optimization program which includes automated exposure control, adjustment of the mA and/or kV according to patient size and/or use of iterative reconstruction technique. CONTRAST:  80mL OMNIPAQUE IOHEXOL 350 MG/ML SOLN COMPARISON:  CT scan abdomen and pelvis from 04/19/2022. FINDINGS: VASCULAR Aorta: Normal caliber aorta without aneurysm, dissection, vasculitis or significant stenosis. Celiac: Patent without evidence of aneurysm, dissection, vasculitis or significant stenosis. SMA: Patent without evidence of aneurysm, dissection, vasculitis or significant stenosis. Renals: Both renal arteries are patent without evidence of aneurysm, dissection, vasculitis, fibromuscular dysplasia or significant stenosis. IMA: Patent without evidence of aneurysm, dissection, vasculitis or significant stenosis. Inflow: Patent without evidence of aneurysm, dissection, vasculitis or significant stenosis. Proximal Outflow: Bilateral common femoral and visualized portions of the superficial and profunda femoral arteries are patent without evidence of aneurysm, dissection, vasculitis or significant stenosis.  Veins: No obvious venous abnormality within the limitations of this arterial phase study. Review of the MIP images confirms the above findings. NON-VASCULAR Lower chest: Redemonstration of diffuse interstitial thickening along with ground-glass changes and patchy areas of bronchiectasis in the visualized bilateral lung bases, concerning for pulmonary  fibrosis. No dense consolidation or pleural effusion. There is a stable subpleural 5 x 5 mm solid noncalcified nodule in the left lung lower lobe, present since the prior study from 03/30/2019. The heart is enlarged in size. No pericardial effusion. Hepatobiliary: The liver is normal in size. Non-cirrhotic configuration. No suspicious mass. There is a stable 9 x 12 mm simple cyst in the right hepatic dome. No intrahepatic or extrahepatic bile duct dilation. No calcified gallstones. Normal gallbladder wall thickness. No pericholecystic inflammatory changes. Pancreas: Unremarkable. No pancreatic ductal dilatation or surrounding inflammatory changes. Spleen: Within normal limits. There are stable punctate calcified granulomas. There is a stable approximately 1 cm calcification abutting the surface of the anterior superior spleen, unchanged. Adrenals/Urinary Tract: Adrenal glands are unremarkable. No suspicious renal mass. No hydronephrosis. No renal or ureteric calculi. Unremarkable urinary bladder. Stomach/Bowel: There is a small sliding hiatal hernia. No disproportionate dilation of the small or large bowel loops. The appendix was not visualized; however there is no acute inflammatory process in the right lower quadrant. Small-to-moderate amount of stool burden. There is an approximately 2.5-3 cm long segment of proximal sigmoid colon which exhibits asymmetric fullness and probable subtle pericolonic fat stranding on the background of several diverticula, concerning for early/subacute diverticulitis. In this region, there are linear areas of contrast extravasation on arterial phase images (series 12, image 59), which increases in amount on delayed phase images (series 19, images 59-63), compatible with active GI bleed. Underlying tumor can not be excluded on this exam. Correlation with colonoscopy is recommended. Vascular/Lymphatic: No ascites or pneumoperitoneum. No abdominal or pelvic lymphadenopathy, by size  criteria. No aneurysmal dilation of the major abdominal arteries. There are mild peripheral atherosclerotic vascular calcifications of the aorta and its major branches. Reproductive: The uterus is surgically absent. No large adnexal mass. Other: The visualized soft tissues and abdominal wall are unremarkable. Musculoskeletal: No suspicious osseous lesions. There are mild - moderate multilevel degenerative changes in the visualized spine. IMPRESSION: 1. Approximately 2.5-3 cm long segment of proximal sigmoid colon exhibits asymmetric fullness and probable subtle pericolonic fat stranding on the background of several diverticula, concerning for early/subacute diverticulitis. In this region, there is suggestion of active GI bleed, as described in detail above. Underlying tumor can not be excluded on this exam. Correlation with colonoscopy is recommended. 2. Multiple other nonacute observations, as described above. Aortic Atherosclerosis (ICD10-I70.0). Electronically Signed   By: Jules Schick M.D.   On: 08/17/2022 09:01   DG Chest Portable 1 View  Result Date: 08/17/2022 CLINICAL DATA:  89 year old female with history of shortness of breath. EXAM: PORTABLE CHEST 1 VIEW COMPARISON:  Chest x-ray 05/08/2021. FINDINGS: Lung volumes are slightly low. Diffuse interstitial prominence and widespread peribronchial cuffing, similar but slightly increased when compared to the prior examination. No confluent consolidative airspace disease. No pleural effusions. No pneumothorax. Pulmonary vasculature does not appear engorged. Heart size is upper limits of normal. Atherosclerotic calcifications are noted in the thoracic aorta. Status post median sternotomy. Left atrial appendage ligation clip noted. IMPRESSION: 1. Overall, the appearance of the chest suggests progressive chronic interstitial lung disease. Follow-up nonemergent high-resolution chest CT is suggested to better evaluate these findings. 2. Aortic atherosclerosis.  Electronically Signed  By: Trudie Reed M.D.   On: 08/17/2022 07:11    Microbiology: Results for orders placed or performed in visit on 04/11/22  Urine Culture     Status: Abnormal   Collection Time: 04/11/22 11:22 AM   Specimen: Urine  Result Value Ref Range Status   MICRO NUMBER: [id number]  Final   SPECIMEN QUALITY: Adequate  Final   Sample Source URINE  Final   STATUS: FINAL  Final   ISOLATE 1: Proteus mirabilis (A)  Final    Comment: Greater than 100,000 CFU/mL of Proteus mirabilis      Susceptibility   Proteus mirabilis - URINE CULTURE, REFLEX    AMOX/CLAVULANIC <=2 Sensitive     AMPICILLIN <=2 Sensitive     AMPICILLIN/SULBACTAM <=2 Sensitive     CEFAZOLIN* <=4 Not Reportable      * For infections other than uncomplicated UTI caused by E. coli, K. pneumoniae or P. mirabilis: Cefazolin is resistant if MIC > or = 8 mcg/mL. (Distinguishing susceptible versus intermediate for isolates with MIC < or = 4 mcg/mL requires additional testing.) For uncomplicated UTI caused by E. coli, K. pneumoniae or P. mirabilis: Cefazolin is susceptible if MIC <32 mcg/mL and predicts susceptible to the oral agents cefaclor, cefdinir, cefpodoxime, cefprozil, cefuroxime, cephalexin and loracarbef.     CEFTAZIDIME <=1 Sensitive     CEFEPIME <=1 Sensitive     CEFTRIAXONE <=1 Sensitive     CIPROFLOXACIN <=0.25 Sensitive     LEVOFLOXACIN <=0.12 Sensitive     GENTAMICIN <=1 Sensitive     IMIPENEM 0.5 Sensitive     NITROFURANTOIN 64 Resistant     PIP/TAZO <=4 Sensitive     TOBRAMYCIN <=1 Sensitive     TRIMETH/SULFA* <=20 Sensitive      * For infections other than uncomplicated UTI caused by E. coli, K. pneumoniae or P. mirabilis: Cefazolin is resistant if MIC > or = 8 mcg/mL. (Distinguishing susceptible versus intermediate for isolates with MIC < or = 4 mcg/mL requires additional testing.) For uncomplicated UTI caused by E. coli, K. pneumoniae or P. mirabilis: Cefazolin is susceptible if  MIC <32 mcg/mL and predicts susceptible to the oral agents cefaclor, cefdinir, cefpodoxime, cefprozil, cefuroxime, cephalexin and loracarbef. Legend: S = Susceptible  I = Intermediate R = Resistant  NS = Not susceptible * = Not tested  NR = Not reported **NN = See antimicrobic comments     Labs: CBC: Recent Labs  Lab 08/17/22 0652 08/17/22 1046 08/17/22 1856 08/17/22 2201 08/18/22 0457 08/19/22 0446 08/20/22 0841  WBC 8.3  --   --   --  16.5* 23.0* 20.7*  HGB 13.4   < > 13.7 13.3 13.2 12.5 12.2  HCT 40.7   < > 41.2 39.0 39.1 36.5 35.6*  MCV 92.5  --   --   --  91.6 89.7 90.8  PLT 174  --   --   --  169 134* 135*   < > = values in this interval not displayed.   Basic Metabolic Panel: Recent Labs  Lab 08/17/22 0652 08/19/22 0446  NA 138 137  K 3.7 3.6  CL 104 104  CO2 24 22  GLUCOSE 94 116*  BUN 10 21  CREATININE 0.79 0.67  CALCIUM 9.3 8.5*   Liver Function Tests: Recent Labs  Lab 08/17/22 0652  AST 20  ALT 15  ALKPHOS 73  BILITOT 1.2  PROT 7.5  ALBUMIN 3.7   CBG: No results for input(s): "GLUCAP" in the last 168 hours.  Discharge time spent: greater than 30 minutes.  This record has been created using Conservation officer, historic buildings. Errors have been sought and corrected,but may not always be located. Such creation errors do not reflect on the standard of care.   Signed: Arnetha Courser, MD [facility] 08/20/2022

## 2022-08-20 NOTE — TOC Transition Note (Signed)
Transition of Care [facility]) - CM/SW Discharge Note   Patient Details  Name: Wanda Nelson MRN: [id number] Date of Birth: July 05, 1931  Transition of [facility]) CM/SW Contact:  Margarito Liner, LCSW Phone Number: 08/20/2022, 1:22 PM   Clinical Narrative:  Patient has orders to discharge home today. CSW met with patient. No supports at bedside. CSW introduced role and explained that PT recommendations would be discussed. Patient lives at [facility]. Patient declined home health but will contact her PCP if she changes her mind later on. Patient reported that she walks two miles per day with her friend that is a retired PT. [facility] RN confirmed patient is independent. No further concerns. CSW signing off.  Final next level of care: Home/Self Care Barriers to Discharge: No Barriers Identified   Patient Goals and CMS Choice      Discharge Placement                  Patient to be transferred to facility by: Son or daughter-in-law   Patient and family notified of of transfer: 08/20/22  Discharge Plan and Services Additional resources added to the After Visit Summary for                                       Social Determinants of Health (SDOH) Interventions SDOH Screenings   Food Insecurity: No Food Insecurity (08/17/2022)  Housing: Low Risk  (08/17/2022)  Transportation Needs: No Transportation Needs (08/17/2022)  Utilities: Not At Risk (08/17/2022)  Alcohol Screen: Low Risk  (05/17/2022)  Depression (PHQ2-9): Low Risk  (05/15/2022)  Financial Resource Strain: Low Risk  (05/17/2022)  Physical Activity: Sufficiently Active (05/17/2022)  Social Connections: Socially Integrated (05/17/2022)  Stress: No Stress Concern Present (05/17/2022)  Tobacco Use: Low Risk  (08/17/2022)     Readmission Risk Interventions     No data to display

## 2022-08-20 NOTE — Progress Notes (Signed)
Discharge instructions reviewed with patient including followup visits and new medications.  Understanding was verbalized and all questions were answered.  IV removed without complication; patient tolerated well.  Patient discharged home via wheelchair in stable condition escorted by nursing staff.  

## 2022-08-20 NOTE — Evaluation (Signed)
Physical Therapy Evaluation Patient Details Name: Wanda Nelson MRN: [id number] DOB: 31-May-1931 Today's Date: 08/20/2022  History of Present Illness  Wanda Nelson is a 89 y/o F with medical history significant of atrial myxoma status post excision 2020, large hiatal hernia status post repair 2023, HTN, HLD, PAF not on anticoagulations, presented with new onset of rectal bleeding. MD assessment includes: s/p Visceral angioplasty/embolization, Lower GI bleed, Diverticulitis large intestine  Clinical Impression  Pt was pleasant and motivated to participate during the session and put forth good effort throughout. Initial session readings of 97% SpO2 on room air and HR 90 bpm. Pt able to perform transfers Independently. Ambulated 180 feet around unit with no AD, independently. Mild left/right drift seen but not significant enough to cause a change in gait pattern or LoB. Pt rates ambulatory distance at medium difficulty. Final session readings of 91% SpO2 and HR of 97 bpm. Pt will benefit from continued PT services upon discharge to safely address deficits listed in patient problem list for decreased caregiver assistance and eventual return to PLOF.           If plan is discharge home, recommend the following: A little help with walking and/or transfers   Can travel by private vehicle        Equipment Recommendations None recommended by PT  Recommendations for Other Services       Functional Status Assessment Patient has not had a recent decline in their functional status     Precautions / Restrictions Precautions Precautions: None Restrictions Weight Bearing Restrictions: No      Mobility  Bed Mobility               General bed mobility comments: NT, pt found in recliner    Transfers Overall transfer level: Independent Equipment used: None                    Ambulation/Gait Ambulation/Gait assistance: Independent Gait Distance (Feet): 180  Feet Assistive device: None Gait Pattern/deviations: WFL(Within Functional Limits), Drifts right/left Gait velocity: decreased     General Gait Details: Overall WFL, pt reports gait is close to baseline. Some mild drifting to left/right see but not significant enough to cause LoB. pt very stable throughout. Rated medium difficulty.  Stairs            Wheelchair Mobility     Tilt Bed    Modified Rankin (Stroke Patients Only)       Balance Overall balance assessment: Independent                                           Pertinent Vitals/Pain Pain Assessment Pain Assessment: 0-10 Pain Score: 2  Pain Location: L abdomen (procedure site) Pain Descriptors / Indicators: Sore Pain Intervention(s): Monitored during session, Limited activity within patient's tolerance    Home Living Family/patient expects to be discharged to:: Private residence Living Arrangements: Alone; pt resident at [facility] Available Help at Discharge: Family Type of Home: House Home Access: Level entry       Home Layout: One level Home Equipment: Agricultural consultant (2 wheels);Rollator (4 wheels);Cane - single point;Shower seat Additional Comments: Pt has DME from deceased husband, but pt does not use equipment at baseline.    Prior Function Prior Level of Function : Independent/Modified Independent;Driving             Mobility Comments:  IND, no use of DME at baseline, wakles 2 miles every morning with friend ADLs Comments: IND     Extremity/Trunk Assessment   Upper Extremity Assessment Upper Extremity Assessment: Defer to OT evaluation    Lower Extremity Assessment Lower Extremity Assessment: Overall WFL for tasks assessed       Communication   Communication Communication: No apparent difficulties  Cognition Arousal: Alert Behavior During Therapy: WFL for tasks assessed/performed Overall Cognitive Status: Within Functional Limits for tasks assessed                                           General Comments General comments (skin integrity, edema, etc.): Pt on room air.    Exercises     Assessment/Plan    PT Assessment Patient does not need any further PT services  PT Problem List Decreased balance;Decreased strength       PT Treatment Interventions      PT Goals (Current goals can be found in the Care Plan section)  Acute Rehab PT Goals Patient Stated Goal: get baxk to walking 2 miles PT Goal Formulation: With patient Time For Goal Achievement: 09/02/22 Potential to Achieve Goals: Good    Frequency Min 1X/week     Co-evaluation               AM-PAC PT "6 Clicks" Mobility  Outcome Measure Help needed turning from your back to your side while in a flat bed without using bedrails?: None Help needed moving from lying on your back to sitting on the side of a flat bed without using bedrails?: None Help needed moving to and from a bed to a chair (including a wheelchair)?: None Help needed standing up from a chair using your arms (e.g., wheelchair or bedside chair)?: None Help needed to walk in hospital room?: None Help needed climbing 3-5 steps with a railing? : None 6 Click Score: 24    End of Session Equipment Utilized During Treatment: Gait belt Activity Tolerance: Patient tolerated treatment well;No increased pain Patient left: in chair;with call bell/phone within reach;with chair alarm set Nurse Communication: Mobility status PT Visit Diagnosis: Unsteadiness on feet (R26.81)    Time: 9147-8295 PT Time Calculation (min) (ACUTE ONLY): 21 min   Charges:                 Cecile Sheerer, SPT 08/20/22, 1:20 PM

## 2022-08-20 NOTE — Consult Note (Signed)
[facility] Customer service manager [facility]) Accountable Care Organization (ACO) [facility] Liaison Note  08/20/2022  Orlene Gursky 06/02/1931 [id number]  Location: [facility] RN Hospital Liaison screened the patient remotely at [facility].  Insurance:  Medicare   Erena Ducre is a 89 y.o. female who is a Primary Care Patient of Dana Allan, MD. The patient was screened for  day readmission hospitalization with noted low risk score for unplanned readmission risk with 1 IP/1 ED in 6 months.  The patient was assessed for potential [facility]) Care Management service needs for post hospital transition for care coordination. Review of patient's electronic medical record reveals patient to discharged to [facility] via [facility] documentation.  No other needs presented to address at this time. Facility will continue to manage pt's needs at this time.   [facility] Care Management/Population Health does not replace or interfere with any arrangements made by the Inpatient Transition of Care team.   For questions contact:   Elliot Cousin, RN, [facility] Liaison [place]   Population Health Office Hours MTWF  8:00 am-6:00 pm Off on Thursday [phone] mobile [phone] [Office toll free line] Office Hours are M-F 8:30 - 5 pm [email or web address]

## 2022-08-20 NOTE — Progress Notes (Signed)
  Progress Note    08/20/2022 9:44 AM 3 Days Post-Op  Subjective:  Wanda Nelson is an 89 yo female now POD# 3 from  Aortogram and selective angiogram of the IMA as well as selective imaging of the 2 sigmoidal branches of the IMA .  Patient endorses feeling well this morning.  She was started on a clear liquid diet over the weekend and tolerated well.  She wishes to advance her diet.  Patient endorses wanting to go home today.  Patient also endorses wanting to get out of bed as she states she has not been all weekend.  No other complaints today.  Vitals are remained stable.   Vitals:   08/20/22 0316 08/20/22 0829  BP: 129/74 136/68  Pulse: 76 74  Resp: 20 18  Temp: 98.1 F (36.7 C) 97.9 F (36.6 C)  SpO2: 95% 95%   Physical Exam: Cardiac:  RRR, Normal S1,S2. No murmurs Lungs: Clear on auscultation throughout, no rales, rhonchi or wheezing noted. Incisions: Right groin incision dressing clean dry and intact, no hematoma seroma to note. Extremities: All extremities with palpable pulses. Abdomen: Positive bowel sounds throughout, soft, nontender and nondistended. Neurologic: Alert and oriented x 4, follows all commands and answers questions appropriately.  CBC    Component Value Date/Time   WBC 20.7 (H) 08/20/2022 0841   RBC 3.92 08/20/2022 0841   HGB 12.2 08/20/2022 0841   HCT 35.6 (L) 08/20/2022 0841   PLT 135 (L) 08/20/2022 0841   MCV 90.8 08/20/2022 0841   MCH 31.1 08/20/2022 0841   MCHC 34.3 08/20/2022 0841   RDW 13.8 08/20/2022 0841   LYMPHSABS 1.8 04/11/2022 1122   MONOABS 0.6 04/11/2022 1122   EOSABS 0.4 04/11/2022 1122   BASOSABS 0.1 04/11/2022 1122    BMET    Component Value Date/Time   NA 137 08/19/2022 0446   K 3.6 08/19/2022 0446   CL 104 08/19/2022 0446   CO2 22 08/19/2022 0446   GLUCOSE 116 (H) 08/19/2022 0446   BUN 21 08/19/2022 0446   CREATININE 0.67 08/19/2022 0446   CALCIUM 8.5 (L) 08/19/2022 0446   GFRNONAA >60 08/19/2022 0446   GFRAA >60  03/31/2018 0428    INR    Component Value Date/Time   INR 1.0 08/17/2022 1046     Intake/Output Summary (Last 24 hours) at 08/20/2022 0944 Last data filed at 08/20/2022 0507 Gross per 24 hour  Intake --  Output 0 ml  Net 0 ml     Assessment/Plan:  89 y.o. female is s/p Aortogram and selective angiogram of the IMA as well as selective imaging of the 2 sigmoidal branches of the IMA .  3 Days Post-Op   PLAN: Vascular surgery is okay with patient being discharged home today.  Patient is recovering as expected.  Patient to return to vascular clinic for follow-up in 1 month. Patient noted to be discharged on 81 mg aspirin daily and Lipitor 20 mg daily.  DVT prophylaxis: ASA 81 mg daily.   Marcie Bal [facility] 08/20/2022 9:44 AM

## 2022-08-20 NOTE — Evaluation (Signed)
Occupational Therapy Evaluation Patient Details Name: Wanda Nelson MRN: [id number] DOB: May 14, 1931 Today's Date: 08/20/2022   History of Present Illness Wanda Nelson is a 89 y/o F with medical history significant of atrial myxoma status post excision 2020, large hiatal hernia status post repair 2023, HTN, HLD, PAF not on anticoagulations, presented with new onset of rectal bleeding   Clinical Impression   Patient received for OT evaluation. See flowsheet below for details of function. Generally, patient independent for bed mobility, independent without AD for functional mobility, and independent for ADLs. Patient with no further need for OT in acute care; discharge OT services.        If plan is discharge home, recommend the following:  (No assist needed; pt (I))    Functional Status Assessment  Patient has not had a recent decline in their functional status  Equipment Recommendations  None recommended by OT    Recommendations for Other Services       Precautions / Restrictions Precautions Precautions: None Restrictions Weight Bearing Restrictions: No      Mobility Bed Mobility Overal bed mobility: Independent                  Transfers Overall transfer level: Independent Equipment used: None               General transfer comment: OT present, but no assist needed.      Balance Overall balance assessment: Independent                                         ADL either performed or assessed with clinical judgement   ADL Overall ADL's : Independent                                       General ADL Comments: Able to don BIL socks and shoes at EOB, walk to sink for standing grooming, walk to bathroom for seated toileting, perform hygiene. No assist required from OT. Pt slightly lightheaded when first standing up; resolved with approx 90 seconds of standing at EOB for safety. No concerns for ADLs at home.      Vision Baseline Vision/History: 1 Wears glasses       Perception         Praxis         Pertinent Vitals/Pain Pain Assessment Pain Assessment: 0-10 Pain Score: 2  Pain Location: L abdomen (procedure site) Pain Descriptors / Indicators: Sore Pain Intervention(s): Limited activity within patient's tolerance, Monitored during session     Extremity/Trunk Assessment Upper Extremity Assessment Upper Extremity Assessment: Overall WFL for tasks assessed   Lower Extremity Assessment Lower Extremity Assessment: Defer to PT evaluation       Communication Communication Communication: No apparent difficulties   Cognition Arousal: Alert Behavior During Therapy: WFL for tasks assessed/performed Overall Cognitive Status: Within Functional Limits for tasks assessed                                 General Comments: Very pleasant, motivated, A+O     General Comments  Pt on room air.    Exercises     Shoulder Instructions      Home Living Family/patient expects to be discharged to:: Private residence (ILF  home at [facility]) Living Arrangements: Alone Available Help at Discharge: Family (son and daughter-in-law) Type of Home: House Home Access: Level entry     Home Layout: One level     Bathroom Shower/Tub: Producer, television/film/video: Handicapped height Bathroom Accessibility: Yes How Accessible: Accessible via wheelchair Home Equipment: Agricultural consultant (2 wheels);Rollator (4 wheels);Cane - single point;Shower seat   Additional Comments: Pt has DME from deceased husband, but pt does not use equipment at baseline.      Prior Functioning/Environment Prior Level of Function : Independent/Modified Independent;Driving             Mobility Comments: (I), no AD, denies falls. Walks 2 miles every morning with a friend, either outside or around a track indoors          OT Problem List:        OT Treatment/Interventions:      OT  Goals(Current goals can be found in the care plan section) Acute Rehab OT Goals Patient Stated Goal: Go home OT Goal Formulation: All assessment and education complete, DC therapy  OT Frequency:      Co-evaluation              AM-PAC OT "6 Clicks" Daily Activity     Outcome Measure Help from another person eating meals?: None Help from another person taking care of personal grooming?: None Help from another person toileting, which includes using toliet, bedpan, or urinal?: None Help from another person bathing (including washing, rinsing, drying)?: None Help from another person to put on and taking off regular upper body clothing?: None Help from another person to put on and taking off regular lower body clothing?: None 6 Click Score: 24   End of Session Equipment Utilized During Treatment:  (none) Nurse Communication: Mobility status  Activity Tolerance: Patient tolerated treatment well Patient left: in chair (NA in room changing sheets)                   Time: 0981-1914 OT Time Calculation (min): 26 min Charges:  OT General Charges $OT Visit: 1 Visit OT Evaluation $OT Eval Moderate Complexity: 1 Mod   Wanda Panning, MS, OTR/L  Wanda Nelson 08/20/2022, 10:17 AM

## 2022-08-21 ENCOUNTER — Telehealth: Payer: Self-pay | Admitting: *Deleted

## 2022-08-21 NOTE — Transitions of Care (Post Inpatient/ED Visit) (Signed)
08/21/2022  Name: Wanda Nelson MRN: [id number] DOB: 06-22-1931  Today's TOC FU Call Status: Today's TOC FU Call Status:: Successful TOC FU Call Completed TOC FU Call Complete Date: 08/21/22  Transition Care Management Follow-up Telephone Call Date of Discharge: 08/20/22 Discharge Facility: [facility]) Type of Discharge: Inpatient Admission Primary Inpatient Discharge Diagnosis:: lower gi bleed How have you been since you were released from the hospital?: Better Any questions or concerns?: Yes Patient Questions/Concerns:: what foods can I have for  soft foods. RN went over soft foods, heart healthy  Items Reviewed: Did you receive and understand the discharge instructions provided?: Yes Medications obtained,verified, and reconciled?: Yes (Medications Reviewed) Any new allergies since your discharge?: No Dietary orders reviewed?: Yes Type of Diet Ordered:: sofy low sodium heaart healthy Do you have support at home?: Yes People in Home: alone Name of Support/Comfort Primary Source: anita, eric and steve  Medications Reviewed Today: Medications Reviewed Today     Reviewed by Luella Cook, RN (Case Manager) on 08/21/22 at 1548  Med List Status: <None>   Medication Order Taking? Sig Documenting Provider Last Dose Status Informant  acetaminophen (TYLENOL) 325 MG tablet [id number] Yes Take 2 tablets (650 mg total) by mouth every 6 (six) hours as needed for mild pain (or Fever >/= 101). Leeroy Bock, MD Taking Active Self  acidophilus (RISAQUAD) CAPS capsule [id number] Yes Take 1 capsule by mouth 3 (three) times daily. Arnetha Courser, MD Taking Active   Ascorbic Acid (VITAMIN C PO) [id number] Yes Take by mouth daily at 12 noon. [provider] Taking Active Self  aspirin EC 81 MG tablet [id number] Yes Take 1 tablet (81 mg total) by mouth daily. McLean-Scocuzza, Pasty Spillers, MD Taking Active Self  atorvastatin (LIPITOR) 20 MG tablet  [id number] Yes Take 1 tablet (20 mg total) by mouth daily at 6 PM. McLean-Scocuzza, Pasty Spillers, MD Taking Active Self  Calcium Carbonate-Vitamin D 500-125 MG-UNIT TABS [id number] Yes Take 1 tablet by mouth daily. [provider] Taking Active Self  cholecalciferol (VITAMIN D) 1000 units tablet [id number] Yes Take 1,000 Units by mouth daily. [provider] Taking Active Self  ciprofloxacin (CIPRO) 500 MG tablet [id number] Yes Take 1 tablet (500 mg total) by mouth 2 (two) times daily for 4 doses. Arnetha Courser, MD Taking Active   ipratropium-albuterol (DUONEB) 0.5-2.5 (3) MG/3ML SOLN [id number] Yes INHALE THE CONTENTS OF ONE (1) VIAL VIA NEBULIZATION EVERY 6 HOURS AS NEEDED Worthy Rancher B, FNP Taking Active Self  loratadine (CLARITIN) 10 MG tablet [id number] Yes Take 1 tablet (10 mg total) by mouth daily. As needed at night McLean-Scocuzza, Pasty Spillers, MD Taking Active Self  losartan (COZAAR) 25 MG tablet [id number] Yes Take 0.5-1 tablets (12.5-25 mg total) by mouth 2 (two) times daily. If BP >140/>80 take losartan 25 mg bid McLean-Scocuzza, Pasty Spillers, MD Taking Active Self  metroNIDAZOLE (FLAGYL) 500 MG tablet [id number] Yes Take 1 tablet (500 mg total) by mouth every 12 (twelve) hours for 4 doses. Arnetha Courser, MD Taking Active   Multiple Vitamin (MULTIVITAMIN) tablet [id number] Yes Take 1 tablet by mouth daily. [provider] Taking Active Self  pantoprazole (PROTONIX) 40 MG tablet [id number] Yes Take 1 tablet (40 mg total) by mouth 2 (two) times daily. Dana Allan, MD Taking Active Self  polyethylene glycol powder [facility]) 17 GM/SCOOP powder [id number] Yes Take 8.5 g by mouth daily. Dana Allan, MD Taking Active Self  sucralfate (CARAFATE) 1 g tablet [id number] Yes Take 1 g  by mouth 4 (four) times daily -  with meals and at bedtime. [provider] Taking Active Self            Home Care and Equipment/Supplies: Were Home Health Services Ordered?: NA Any new  equipment or medical supplies ordered?: NA  Functional Questionnaire: Do you need assistance with bathing/showering or dressing?: No Do you need assistance with meal preparation?: Yes Do you need assistance with eating?: No Do you have difficulty maintaining continence: No Do you need assistance with getting out of bed/getting out of a chair/moving?: No Do you have difficulty managing or taking your medications?: No  Follow up appointments reviewed: PCP Follow-up appointment confirmed?: Yes Date of PCP follow-up appointment?: 08/27/22 Follow-up Provider: Dana Allan Specialist [facility] Follow-up appointment confirmed?: Yes Date of Specialist follow-up appointment?: 08/28/22 Follow-Up Specialty Provider:: [id number] Sheppard Plumber, Dr Servando Snare [id number] Do you need transportation to your follow-up appointment?: No Do you understand care options if your condition(s) worsen?: Yes-patient verbalized understanding  SDOH Interventions Today    Flowsheet Row Most Recent Value  SDOH Interventions   Food Insecurity Interventions Intervention Not Indicated  Transportation Interventions Intervention Not Indicated, Patient Resources (Friends/Family)      Interventions Today    Flowsheet Row Most Recent Value  General Interventions   General Interventions Discussed/Reviewed General Interventions Discussed, General Interventions Reviewed, Doctor Visits  Doctor Visits Discussed/Reviewed Doctor Visits Discussed, Doctor Visits Reviewed  Nutrition Interventions   Nutrition Discussed/Reviewed Nutrition Discussed, Portion sizes  [Rn discussed soft foods, heart healthy foods]  Pharmacy Interventions   Pharmacy Dicussed/Reviewed Pharmacy Topics Discussed      TOC Interventions Today    Flowsheet Row Most Recent Value  TOC Interventions   TOC Interventions Discussed/Reviewed TOC Interventions Discussed, TOC Interventions Reviewed        Gean Maidens BSN RN [facility] Care  Management [phone]

## 2022-08-27 ENCOUNTER — Ambulatory Visit (INDEPENDENT_AMBULATORY_CARE_PROVIDER_SITE_OTHER): Payer: Medicare Other | Admitting: Dermatology

## 2022-08-27 ENCOUNTER — Ambulatory Visit (INDEPENDENT_AMBULATORY_CARE_PROVIDER_SITE_OTHER): Payer: Medicare Other | Admitting: Family Medicine

## 2022-08-27 ENCOUNTER — Encounter: Payer: Self-pay | Admitting: Dermatology

## 2022-08-27 ENCOUNTER — Encounter: Payer: Self-pay | Admitting: Family Medicine

## 2022-08-27 VITALS — BP 106/70 | HR 98

## 2022-08-27 VITALS — BP 128/60 | HR 79 | Temp 97.9°F | Resp 16 | Ht 61.0 in | Wt 97.1 lb

## 2022-08-27 DIAGNOSIS — L570 Actinic keratosis: Secondary | ICD-10-CM | POA: Diagnosis not present

## 2022-08-27 DIAGNOSIS — K55059 Acute (reversible) ischemia of intestine, part and extent unspecified: Secondary | ICD-10-CM

## 2022-08-27 DIAGNOSIS — K922 Gastrointestinal hemorrhage, unspecified: Secondary | ICD-10-CM | POA: Diagnosis not present

## 2022-08-27 DIAGNOSIS — K5790 Diverticulosis of intestine, part unspecified, without perforation or abscess without bleeding: Secondary | ICD-10-CM

## 2022-08-27 LAB — CBC
HCT: 37.5 % (ref 36.0–46.0)
Hemoglobin: 12 g/dL (ref 12.0–15.0)
MCHC: 32 g/dL (ref 30.0–36.0)
MCV: 94.9 fl (ref 78.0–100.0)
Platelets: 278 10*3/uL (ref 150.0–400.0)
RBC: 3.96 Mil/uL (ref 3.87–5.11)
RDW: 14 % (ref 11.5–15.5)
WBC: 9.4 10*3/uL (ref 4.0–10.5)

## 2022-08-27 NOTE — Patient Instructions (Signed)
It was a pleasure meeting you today. Thank you for allowing me to take part in your health care.  Our goals for today as we discussed include:  We will get some labs today.  If they are abnormal or we need to do something about them, I will call you.  If they are normal, I will send you a message on MyChart (if it is active) or a letter in the mail.  If you don't hear from [place] in 2 weeks, please call the office at the number below.   Follow up with Vascular tomorrow  Follow up with GI in Sept   Follow up as needed with PCP   If you have any questions or concerns, please do not hesitate to call the office at [phone].  I look forward to our next visit and until then take care and stay safe.  Regards,   Dana Allan, MD   [facility]

## 2022-08-27 NOTE — Patient Instructions (Addendum)

## 2022-08-27 NOTE — Assessment & Plan Note (Addendum)
Noted on recent CT abdomen Recent admission for acute diverticulitis now improving Follow up with GI as scheduled

## 2022-08-27 NOTE — Progress Notes (Signed)
   Follow-Up Visit   Subjective  Wanda Nelson is a 89 y.o. female who presents for the following: Patient c/o spots on her left cheek and right hand that will not go away.   The patient has spots, moles and lesions to be evaluated, some may be new or changing and the patient may have concern these could be cancer.    The following portions of the chart were reviewed this encounter and updated as appropriate: medications, allergies, medical history  Review of Systems:  No other skin or systemic complaints except as noted in HPI or Assessment and Plan.  Objective  Well appearing patient in no apparent distress; mood and affect are within normal limits.  A focused examination was performed of the following areas:left cheek,right cheek,right hand   Relevant physical exam findings are noted in the Assessment and Plan.  right dorsum hand x 1 Erythematous hypertrophic papule   bilateral cheeks (2) Erythematous thin papules/macules with gritty scale.     Assessment & Plan   Hypertrophic actinic keratosis right dorsum hand x 1  Given thick nature of lesion, offered extended cryotherapy vs biopsy. Patient opts for cryotherapy. Will biopsy if it does not resolve  Destruction of lesion - right dorsum hand x 1 Complexity: simple   Destruction method: cryotherapy   Informed consent: discussed and consent obtained   Timeout:  patient name, date of birth, surgical site, and procedure verified Lesion destroyed using liquid nitrogen: Yes   Region frozen until ice ball extended beyond lesion: Yes   Outcome: patient tolerated procedure well with no complications   Post-procedure details: wound care instructions given    AK (actinic keratosis) (2) bilateral cheeks  Destruction of lesion - bilateral cheeks (2) Complexity: simple   Destruction method: cryotherapy   Informed consent: discussed and consent obtained   Timeout:  patient name, date of birth, surgical site, and  procedure verified Lesion destroyed using liquid nitrogen: Yes   Region frozen until ice ball extended beyond lesion: Yes   Outcome: patient tolerated procedure well with no complications   Post-procedure details: wound care instructions given       Return for scheduled appt  January 23,2025.  IAngelique Holm, CMA, am acting as scribe for Elie Goody, MD .   Documentation: I have reviewed the above documentation for accuracy and completeness, and I agree with the above.  Elie Goody, MD

## 2022-08-27 NOTE — Assessment & Plan Note (Signed)
S/p arterial embolization of 2 main sigmoidal branches of IMA. No acute abdominal pain and no further episodes of rectal bleeding Follow up with Vascular surgery in am Check CBC, Bmet

## 2022-08-27 NOTE — Progress Notes (Signed)
SUBJECTIVE:   Chief Complaint  Patient presents with   Hospitalization Follow-up   HPI Presents to clinic for hospital follow up   Recently admitted to [facility] on 08/09 for acute diverticulitis and lower GI bleed.  S/p Mesenteric arterial embolization of 2 main sigmoidal branches of IMA. Discharged 08/12.  Since then has been doing well. Still having some LLQ discomfort but much better.  Denies any fevers, nausea, bleeding.  Passing gas and has moved bowels without bloody stool.  Appetite is slowly returning.  She continues to take Miralax daily.  Does not eat much fruits or vegetables.  Has follow up with Vascular tomorrow and GI appointment in Sept.  PERTINENT PMH / PSH: Diverticulosis  OBJECTIVE:  BP 128/60   Pulse 79   Temp 97.9 F (36.6 C)   Resp 16   Ht 5\' 1"  (1.549 m)   Wt 97 lb 2 oz (44.1 kg)   LMP  (LMP Unknown)   SpO2 98%   BMI 18.35 kg/m    Physical Exam Vitals reviewed.  Constitutional:      General: She is not in acute distress.    Appearance: Normal appearance. She is normal weight. She is not ill-appearing, toxic-appearing or diaphoretic.  Eyes:     General:        Right eye: No discharge.        Left eye: No discharge.     Conjunctiva/sclera: Conjunctivae normal.  Cardiovascular:     Rate and Rhythm: Normal rate and regular rhythm.     Heart sounds: Normal heart sounds.  Pulmonary:     Effort: Pulmonary effort is normal.     Breath sounds: Normal breath sounds.  Abdominal:     General: Bowel sounds are normal.  Musculoskeletal:        General: Normal range of motion.  Skin:    General: Skin is warm and dry.  Neurological:     General: No focal deficit present.     Mental Status: She is alert and oriented to person, place, and time. Mental status is at baseline.  Psychiatric:        Mood and Affect: Mood normal.        Behavior: Behavior normal.        Thought Content: Thought content normal.        Judgment: Judgment normal.         08/27/2022   11:17 AM 05/15/2022    8:38 AM 04/30/2022    4:02 PM 04/11/2022   10:09 AM 01/04/2022    3:27 PM  Depression screen PHQ 2/9  Decreased Interest 0 0 0 0 0  Down, Depressed, Hopeless 0 0 0 0 0  PHQ - 2 Score 0 0 0 0 0  Altered sleeping 0 0 0  2  Tired, decreased energy 0 0 0  0  Change in appetite 0 0 0  2  Feeling bad or failure about yourself  0 0 0  0  Trouble concentrating 0 0 0  0  Moving slowly or fidgety/restless 0 0 0  0  Suicidal thoughts 0 0 0  0  PHQ-9 Score 0 0 0  4  Difficult doing work/chores Not difficult at all Not difficult at all Not difficult at all  Not difficult at all      08/27/2022   11:17 AM 05/15/2022    8:38 AM 04/30/2022    4:02 PM 04/11/2022   10:09 AM  GAD 7 : Generalized Anxiety Score  Nervous, Anxious, on Edge 0 0 0 0  Control/stop worrying 0 0 0 0  Worry too much - different things 0 0 0 0  Trouble relaxing 0 0 0 0  Restless 0 0 0 0  Easily annoyed or irritable 0 0 0 0  Afraid - awful might happen 0 0 0 0  Total GAD 7 Score 0 0 0 0  Anxiety Difficulty Not difficult at all Not difficult at all Not difficult at all Not difficult at all    ASSESSMENT/PLAN:  Lower GI bleed Assessment & Plan: Recent hospitalization, now doing well Check CBC Follow up with GI as scheduled  Orders: -     CBC -     Basic metabolic panel  Mesenteric arterial embolization (HCC) Assessment & Plan: S/p arterial embolization of 2 main sigmoidal branches of IMA. No acute abdominal pain and no further episodes of rectal bleeding Follow up with Vascular surgery in am Check CBC, Bmet   Diverticulosis Assessment & Plan: Noted on recent CT abdomen Recent admission for acute diverticulitis now improving Follow up with GI as scheduled    PDMP reviewed  Return if symptoms worsen or fail to improve.  Dana Allan, MD

## 2022-08-27 NOTE — Assessment & Plan Note (Signed)
Recent hospitalization, now doing well Check CBC Follow up with GI as scheduled

## 2022-08-28 ENCOUNTER — Ambulatory Visit (INDEPENDENT_AMBULATORY_CARE_PROVIDER_SITE_OTHER): Payer: Medicare Other | Admitting: Nurse Practitioner

## 2022-08-28 ENCOUNTER — Encounter (INDEPENDENT_AMBULATORY_CARE_PROVIDER_SITE_OTHER): Payer: Self-pay | Admitting: Nurse Practitioner

## 2022-08-28 VITALS — BP 136/75 | HR 79 | Resp 15 | Wt 99.0 lb

## 2022-08-28 DIAGNOSIS — K55059 Acute (reversible) ischemia of intestine, part and extent unspecified: Secondary | ICD-10-CM | POA: Diagnosis not present

## 2022-08-28 DIAGNOSIS — I10 Essential (primary) hypertension: Secondary | ICD-10-CM | POA: Diagnosis not present

## 2022-08-28 LAB — BASIC METABOLIC PANEL
BUN: 12 mg/dL (ref 6–23)
CO2: 25 meq/L (ref 19–32)
Calcium: 9.5 mg/dL (ref 8.4–10.5)
Chloride: 102 meq/L (ref 96–112)
Creatinine, Ser: 0.7 mg/dL (ref 0.40–1.20)
GFR: 76.67 mL/min (ref >60.00)
Glucose, Bld: 109 mg/dL — ABNORMAL HIGH (ref 70–99)
Potassium: 4 meq/L (ref 3.5–5.1)
Sodium: 136 mEq/L (ref 135–145)

## 2022-08-28 NOTE — Progress Notes (Signed)
Subjective:    Patient ID: Wanda Nelson, female    DOB: 1931/05/29, 89 y.o.   MRN: [id number] Chief Complaint  Patient presents with   Follow-up    [facility] 1 week follow up    Wanda Nelson is a 89 year old female who presents today for evaluation following mesenteric intervention for GI bleed on 08/17/2022 including:  Procedure(s) Performed:             1.  Ultrasound guidance for vascular access right femoral artery             2.  Catheter placement into 2 sigmoidal branches of the IMA             3.  Aortogram and selective angiogram of the IMA as well as selective imaging of the 2 sigmoidal branches of the IMA             4.  Microbead embolization of the 2 main sigmoidal branches of the IMA with a total of 2 cc of 600  polyvinyl alcohol beads, 1 cc in each branch.             5.  Coil embolization of the larger of the 2 sigmoidal branches with a pair of Ruby coils             6.  StarClose closure device right femoral artery  Since her discharge she has not had any further evidence of rectal bleeding.  Her hemoglobin remained stable.  Initially she had some issues with some constipation but recently has been switched over to [facility] and so that is beginning to even out somewhat.  She notes that she is tolerated the procedure well with just a bit of soreness at her groin site postintervention.  The right side today has evidence of puncture but just a small amount of bruising.     Review of Systems  Gastrointestinal:  Negative for blood in stool.  All other systems reviewed and are negative.      Objective:   Physical Exam Vitals reviewed.  HENT:     Head: Normocephalic.  Cardiovascular:     Rate and Rhythm: Normal rate.     Pulses:          Dorsalis pedis pulses are 2+ on the right side and 2+ on the left side.       Posterior tibial pulses are 1+ on the right side and 1+ on the left side.  Pulmonary:     Effort: Pulmonary effort is normal.  Musculoskeletal:      Right lower leg: No edema.     Left lower leg: No edema.  Skin:    General: Skin is warm and dry.  Neurological:     Mental Status: She is alert and oriented to person, place, and time.  Psychiatric:        Mood and Affect: Mood normal.        Behavior: Behavior normal.        Thought Content: Thought content normal.        Judgment: Judgment normal.     BP 136/75 (BP Location: Left Arm)   Pulse 79   Resp 15   Wt 99 lb (44.9 kg)   LMP  (LMP Unknown)   BMI 18.71 kg/m   Past Medical History:  Diagnosis Date   Actinic keratosis    Anemia    Aortic atherosclerosis (HCC)    Atrial myxoma 03/17/2018   a.) TTE 03/17/2018 --> 2.4  x 2.5 cm LEFT atrial mass attached at fossa ovalis/atrial septum; pedunculated in shape and solid in texture; b.) s/p atrial myoxma resection 03/21/2018; initially attempted via minimally invasive RIGHT thoracotomy approach, however due to bleeding complications, procedure required median sternotomy and patient going on cardiopulmonary bypass   Basal cell carcinoma 05/17/2021   L upper back 5.0 cm lat to spine - ED&C   Diastolic dysfunction    a.) TTE 03/17/2018: EF 55-60%, LVH, mild PR, G1DD; b.) TTE 07/25/2018: EF 60-65%, RVSP 44.9, mild-mod MR/TR, G1DD; c.) TTE 12/18/2018: EF 60-65%; d.) TTE 06/12/2019: EF 50-55, mild BAE, triv TR, PASP 37.5, G2DD; e.) TTE 07/22/2020: EF 60-65%, mild MR, mod TR, RVSP 42.7, G1DD; f.) TTE 05/08/2021: EF 60-65%, mild LVH, mild-mod TR, AoV sclerosis without stenosis   Dyspnea    Fatigue 12/19/2021   GERD (gastroesophageal reflux disease)    Glaucoma    Hemorrhagic cerebrovascular accident (CVA) (HCC) 1990   Hiatal hernia    Hiatal hernia 05/16/2021   History of 2019 novel coronavirus disease (COVID-19)    a.) 05/30/20 - Tx'd with "covid 19 pill"; b.) 10/2021   History of blood transfusion    History of hiatal hernia    History of left atrial appendage closure 03/21/2018   a.) #45 Atricure   Hyperlipidemia     Hypertension    Hypoxia    Incidental pulmonary nodule, > 3mm and < 8mm 03/20/2018   Noted on CT scan - needs f/u imaging 6 months   Lung fibrosis (HCC) 11/16/2020   NSVT (nonsustained ventricular tachycardia) (HCC) 05/13/2018   a.) holter 05/13/2018 --> 5 short runs   PAF (paroxysmal atrial fibrillation) (HCC) 05/13/2018   a.) holter 05/13/2018 --> intermittent episodes with longest lasting approx 1.5 hours (burden <1%).   Pain due to onychomycosis of toenails of both feet 07/28/2020   Positive self-administered antigen test for COVID-19 10/18/2021   PSVT (paroxysmal supraventricular tachycardia) 05/13/2018   a.) holter 05/13/2018 --> frequent runs of SVT noted during study   Pulmonary fibrosis (HCC)    Squamous cell carcinoma of skin 11/12/2019   L distal dorsum forearm - ED&C    Viral URI 05/31/2020    Social History   Socioeconomic History   Marital status: Widowed    Spouse name: Not on file   Number of children: Not on file   Years of education: Not on file   Highest education level: Bachelor's degree (e.g., BA, AB, BS)  Occupational History   Not on file  Tobacco Use   Smoking status: Never   Smokeless tobacco: Never  Vaping Use   Vaping status: Never Used  Substance and Sexual Activity   Alcohol use: No    Alcohol/week: 0.0 standard drinks of alcohol   Drug use: No   Sexual activity: Not Currently  Other Topics Concern   Not on file  Social History Narrative   [place],  husband died 12/28/2018 due to brain hemorrhage   Daughter in law Aymar Shvarts    widowed   Social Determinants of Health   Financial Resource Strain: Low Risk  (05/17/2022)   Overall Financial Resource Strain (CARDIA)    Difficulty of Paying Living Expenses: Not hard at all  Food Insecurity: No Food Insecurity (08/21/2022)   Hunger Vital Sign    Worried About Running Out of Food in the Last Year: Never true    Ran Out of Food in the Last Year: Never true  Transportation Needs: No  Transportation Needs (08/21/2022)  PRAPARE - Administrator, Civil Service (Medical): No    Lack of Transportation (Non-Medical): No  Physical Activity: Sufficiently Active (05/17/2022)   Exercise Vital Sign    Days of Exercise per Week: 7 days    Minutes of Exercise per Session: 30 min  Stress: No Stress Concern Present (05/17/2022)   [employer] of Occupational Health - Occupational Stress Questionnaire    Feeling of Stress : Not at all  Social Connections: Socially Integrated (05/17/2022)   Social Connection and Isolation Panel [NHANES]    Frequency of Communication with Friends and Family: More than three times a week    Frequency of Social Gatherings with Friends and Family: More than three times a week    Attends Religious Services: More than 4 times per year    Active Member of [employer] or Organizations: Yes    Attends Engineer, structural: More than 4 times per year    Marital Status: Married  Catering manager Violence: Not At Risk (08/17/2022)   Humiliation, Afraid, Rape, and Kick questionnaire    Fear of Current or Ex-Partner: No    Emotionally Abused: No    Physically Abused: No    Sexually Abused: No    Past Surgical History:  Procedure Laterality Date   BUNIONECTOMY Bilateral    CLIPPING OF ATRIAL APPENDAGE  03/21/2018   Procedure: Clipping Of Atrial Appendage - using an AtriCure 45mm clip;  Surgeon: Purcell Nails, MD;  Location: [facility] OR;  Service: Open Heart Surgery;;   ESOPHAGOGASTRODUODENOSCOPY (EGD) WITH PROPOFOL N/A 09/06/2021   Procedure: ESOPHAGOGASTRODUODENOSCOPY (EGD) WITH PROPOFOL;  Surgeon: Wyline Mood, MD;  Location: [facility] ENDOSCOPY;  Service: Gastroenterology;  Laterality: N/A;   EXCISION OF ATRIAL MYXOMA N/A 03/21/2018   Procedure: Excision Of Atrial Myxoma;  Surgeon: Purcell Nails, MD;  Location: [facility] OR;  Service: Open Heart Surgery;  Laterality: N/A;   RECTOCELE REPAIR     RIGHT/LEFT HEART CATH AND CORONARY ANGIOGRAPHY N/A 03/19/2018    Procedure: RIGHT/LEFT HEART CATH AND CORONARY ANGIOGRAPHY;  Surgeon: Iran Ouch, MD;  Location: [facility] INVASIVE CV LAB;  Service: Cardiovascular;  Laterality: N/A;   TEE WITHOUT CARDIOVERSION N/A 03/21/2018   Procedure: TRANSESOPHAGEAL ECHOCARDIOGRAM (TEE);  Surgeon: Purcell Nails, MD;  Location: [facility] OR;  Service: Open Heart Surgery;  Laterality: N/A;   TONSILLECTOMY     TOTAL ABDOMINAL HYSTERECTOMY  01/09/1980   VISCERAL ANGIOGRAPHY N/A 08/17/2022   Procedure: VISCERAL ANGIOGRAPHY;  Surgeon: Annice Needy, MD;  Location: [facility] INVASIVE CV LAB;  Service: Cardiovascular;  Laterality: N/A;   XI ROBOTIC ASSISTED PARAESOPHAGEAL HERNIA REPAIR N/A 11/14/2021   Procedure: XI ROBOTIC ASSISTED PARAESOPHAGEAL HERNIA REPAIR, RNFA to assist;  Surgeon: Leafy Ro, MD;  Location: [facility] ORS;  Service: General;  Laterality: N/A;    Family History  Problem Relation Age of Onset   Colon cancer Brother    Cancer Brother        neck tumor cancerous    Esophageal varices Brother    Colon cancer Maternal Uncle    Colon cancer Maternal Grandmother    Kidney disease Brother    Coronary artery disease Brother        with stent, cabg   Breast cancer Neg Hx     Allergies  Allergen Reactions   Bee Venom Anaphylaxis and Hives   Codeine Other (See Comments)    Altered mental status Altered mental status   Morphine And Codeine     Altered mental status  Shellfish Allergy Anaphylaxis and Hives    Mainly shrimp   Bactrim [Sulfamethoxazole-Trimethoprim]     ? Reaction     Norvasc [Amlodipine]     2.5 leg edema    Nitrofurantoin Rash       Latest Ref Rng & Units 08/27/2022   11:51 AM 08/20/2022    8:41 AM 08/19/2022    4:46 AM  CBC  WBC 4.0 - 10.5 K/uL 9.4  20.7  23.0   Hemoglobin 12.0 - 15.0 g/dL 42.5  95.6  38.7   Hematocrit 36.0 - 46.0 % 37.5  35.6  36.5   Platelets 150.0 - 400.0 K/uL 278.0  135  134       CMP     Component Value Date/Time   NA 136 08/27/2022 1151   K 4.0 08/27/2022  1151   CL 102 08/27/2022 1151   CO2 25 08/27/2022 1151   GLUCOSE 109 (H) 08/27/2022 1151   BUN 12 08/27/2022 1151   CREATININE 0.70 08/27/2022 1151   CALCIUM 9.5 08/27/2022 1151   PROT 7.5 08/17/2022 0652   ALBUMIN 3.7 08/17/2022 0652   AST 20 08/17/2022 0652   ALT 15 08/17/2022 0652   ALKPHOS 73 08/17/2022 0652   BILITOT 1.2 08/17/2022 0652   GFR 76.67 08/27/2022 1151   GFRNONAA >60 08/19/2022 0446     No results found.     Assessment & Plan:   1. Mesenteric arterial embolization (HCC) The patient has a small amount of bruising at the insertion site but overall it is healing well.  Her previously noted GI bleed has stopped and her hemoglobin is stable.  Based on that currently no role for further intervention from vascular surgery.  Patient will follow-up with [place] on an as-needed basis.  2. Essential hypertension Continue antihypertensive medications as already ordered, these medications have been reviewed and there are no changes at this time.   Current Outpatient Medications on File Prior to Visit  Medication Sig Dispense Refill   acetaminophen (TYLENOL) 325 MG tablet Take 2 tablets (650 mg total) by mouth every 6 (six) hours as needed for mild pain (or Fever >/= 101).     acidophilus (RISAQUAD) CAPS capsule Take 1 capsule by mouth 3 (three) times daily. 90 capsule 0   Ascorbic Acid (VITAMIN C PO) Take by mouth daily at 12 noon.     aspirin EC 81 MG tablet Take 1 tablet (81 mg total) by mouth daily. 90 tablet 3   atorvastatin (LIPITOR) 20 MG tablet Take 1 tablet (20 mg total) by mouth daily at 6 PM. 90 tablet 3   Calcium Carbonate-Vitamin D 500-125 MG-UNIT TABS Take 1 tablet by mouth daily.     cholecalciferol (VITAMIN D) 1000 units tablet Take 1,000 Units by mouth daily.     ipratropium-albuterol (DUONEB) 0.5-2.5 (3) MG/3ML SOLN INHALE THE CONTENTS OF ONE (1) VIAL VIA NEBULIZATION EVERY 6 HOURS AS NEEDED 360 mL 0   loratadine (CLARITIN) 10 MG tablet Take 1 tablet (10 mg  total) by mouth daily. As needed at night 90 tablet 3   losartan (COZAAR) 25 MG tablet Take 0.5-1 tablets (12.5-25 mg total) by mouth 2 (two) times daily. If BP >140/>80 take losartan 25 mg bid 180 tablet 3   Multiple Vitamin (MULTIVITAMIN) tablet Take 1 tablet by mouth daily.     pantoprazole (PROTONIX) 40 MG tablet Take 1 tablet (40 mg total) by mouth 2 (two) times daily. 180 tablet 0   polyethylene glycol powder (GLYCOLAX/MIRALAX) 17 GM/SCOOP  powder Take 8.5 g by mouth daily. 3350 g 1   sucralfate (CARAFATE) 1 g tablet Take 1 g by mouth 4 (four) times daily -  with meals and at bedtime.     No current facility-administered medications on file prior to visit.    There are no Patient Instructions on file for this visit. No follow-ups on file.   Georgiana Spinner, NP

## 2022-09-03 DIAGNOSIS — H0288A Meibomian gland dysfunction right eye, upper and lower eyelids: Secondary | ICD-10-CM | POA: Diagnosis not present

## 2022-09-03 DIAGNOSIS — H02886 Meibomian gland dysfunction of left eye, unspecified eyelid: Secondary | ICD-10-CM | POA: Diagnosis not present

## 2022-09-03 DIAGNOSIS — H0288B Meibomian gland dysfunction left eye, upper and lower eyelids: Secondary | ICD-10-CM | POA: Diagnosis not present

## 2022-09-03 DIAGNOSIS — H02883 Meibomian gland dysfunction of right eye, unspecified eyelid: Secondary | ICD-10-CM | POA: Diagnosis not present

## 2022-09-03 DIAGNOSIS — H16223 Keratoconjunctivitis sicca, not specified as Sjogren's, bilateral: Secondary | ICD-10-CM | POA: Diagnosis not present

## 2022-09-11 ENCOUNTER — Encounter: Payer: Self-pay | Admitting: Family Medicine

## 2022-09-13 DIAGNOSIS — H6122 Impacted cerumen, left ear: Secondary | ICD-10-CM | POA: Diagnosis not present

## 2022-09-13 DIAGNOSIS — H90A22 Sensorineural hearing loss, unilateral, left ear, with restricted hearing on the contralateral side: Secondary | ICD-10-CM | POA: Diagnosis not present

## 2022-09-17 ENCOUNTER — Ambulatory Visit (INDEPENDENT_AMBULATORY_CARE_PROVIDER_SITE_OTHER): Payer: Medicare Other | Admitting: Gastroenterology

## 2022-09-17 ENCOUNTER — Other Ambulatory Visit: Payer: Self-pay | Admitting: Family

## 2022-09-17 ENCOUNTER — Encounter: Payer: Self-pay | Admitting: Gastroenterology

## 2022-09-17 ENCOUNTER — Other Ambulatory Visit: Payer: Self-pay

## 2022-09-17 VITALS — BP 150/90 | HR 78 | Temp 98.7°F | Ht 61.0 in | Wt 98.2 lb

## 2022-09-17 DIAGNOSIS — Z09 Encounter for follow-up examination after completed treatment for conditions other than malignant neoplasm: Secondary | ICD-10-CM | POA: Diagnosis not present

## 2022-09-17 DIAGNOSIS — Z8719 Personal history of other diseases of the digestive system: Secondary | ICD-10-CM | POA: Diagnosis not present

## 2022-09-17 MED ORDER — NA SULFATE-K SULFATE-MG SULF 17.5-3.13-1.6 GM/177ML PO SOLN: 354.0000 mL | Freq: Once | ORAL | 0 refills | Status: AC

## 2022-09-17 NOTE — Progress Notes (Signed)
Wyline Mood MD, MRCP(U.K) [address]  [address]  [place], [place] [address]  Main: [phone]  Fax: [phone]   Primary Care Physician: Dana Allan, MD  Primary Gastroenterologist:  Dr. Wyline Mood   Chief Complaint  Patient presents with   Acute diverticulitis and lower GI bleed    HPI: Wanda Nelson is a 89 y.o. female Summary of history :   Initially she was seen to undergo a preop endoscopy prior to repair of paraesophageal hernia  09/06/2021: EGD: Large hiatal hernia. The patient had postoperative check in #2022 after repair of paraesophageal hernia doing well 12/11/2021 had a CT angiogram PE protocol that showed features of bronchiectasis versus interstitial lung disease patulous thoracic esophagus with small fluid level suggestive of chronic esophageal dysmotility.   Interval history 12/26/2021-09/17/2022    08/17/2022: Admitted with gi bleed likely diverticular in setting of acute diverticulitis of proximal sigmoid colon , required embolization of sigmoidal blood vessels by Vascular. Treated with Abx and discharged home.   08/27/2022: Hb 12 No recent colonoscopy.  Last was 10 years back.  Since discharge she is doing well.  No abdominal pain.  No rectal bleeding.  She has a family history of colon cancer in her siblings.  She leads a very active lifestyle due to go on a [employer] in December  Current Outpatient Medications  Medication Sig Dispense Refill   acetaminophen (TYLENOL) 325 MG tablet Take 2 tablets (650 mg total) by mouth every 6 (six) hours as needed for mild pain (or Fever >/= 101).     Ascorbic Acid (VITAMIN C PO) Take by mouth daily at 12 noon.     aspirin EC 81 MG tablet Take 1 tablet (81 mg total) by mouth daily. 90 tablet 3   atorvastatin (LIPITOR) 20 MG tablet Take 1 tablet (20 mg total) by mouth daily at 6 PM. 90 tablet 3   Calcium Carbonate-Vitamin D 500-125 MG-UNIT TABS Take 1 tablet by mouth daily.      cholecalciferol (VITAMIN D) 1000 units tablet Take 1,000 Units by mouth daily.     ipratropium-albuterol (DUONEB) 0.5-2.5 (3) MG/3ML SOLN INHALE THE CONTENTS OF ONE (1) VIAL VIA NEBULIZATION EVERY 6 HOURS AS NEEDED 360 mL 0   loratadine (CLARITIN) 10 MG tablet Take 1 tablet (10 mg total) by mouth daily. As needed at night 90 tablet 3   losartan (COZAAR) 25 MG tablet Take 0.5-1 tablets (12.5-25 mg total) by mouth 2 (two) times daily. If BP >140/>80 take losartan 25 mg bid 180 tablet 3   Multiple Vitamin (MULTIVITAMIN) tablet Take 1 tablet by mouth daily.     pantoprazole (PROTONIX) 40 MG tablet Take 1 tablet (40 mg total) by mouth 2 (two) times daily. 180 tablet 0   No current facility-administered medications for this visit.    Allergies as of 09/17/2022 - Review Complete 09/17/2022  Allergen Reaction Noted   Bee venom Anaphylaxis and Hives 03/15/2015   Codeine Other (See Comments) 03/15/2015   Morphine and codeine  03/15/2015   Shellfish allergy Anaphylaxis and Hives 03/15/2015   Bactrim [sulfamethoxazole-trimethoprim]  06/24/2019   Norvasc [amlodipine]  07/20/2020   Nitrofurantoin Rash 03/15/2015      ROS:  General: Negative for anorexia, weight loss, fever, chills, fatigue, weakness. ENT: Negative for hoarseness, difficulty swallowing , nasal congestion. CV: Negative for chest pain, angina, palpitations, dyspnea on exertion, peripheral edema.  Respiratory: Negative for dyspnea at rest, dyspnea on exertion, cough, sputum, wheezing.  GI: See  history of present illness. GU:  Negative for dysuria, hematuria, urinary incontinence, urinary frequency, nocturnal urination.  Endo: Negative for unusual weight change.    Physical Examination:   BP (!) 169/69   Pulse 82   Temp 98.7 F (37.1 C) (Oral)   Ht 5\' 1"  (1.549 m)   Wt 98 lb 3.2 oz (44.5 kg)   LMP  (LMP Unknown)   BMI 18.55 kg/m   General: Well-nourished, well-developed in no acute distress.  Eyes: No icterus. Conjunctivae  pink. Neuro: Alert and oriented x 3.  Grossly intact. Skin: Warm and dry, no jaundice.   Psych: Alert and cooperative, normal mood and affect.   Imaging Studies: DG Chest 2 View  Result Date: 08/18/2022 CLINICAL DATA:  Shortness of breath. EXAM: CHEST - 2 VIEW COMPARISON:  Chest radiograph dated 08/17/2022. FINDINGS: Diffuse chronic antral coarsening. No consolidative changes. There is no pleural effusion or pneumothorax. Stable cardiac silhouette. Left atrial appendage occlusive device. Median sternotomy wires. No acute osseous pathology. IMPRESSION: No active cardiopulmonary disease.  Chronic interstitial coarsening. Electronically Signed   By: Elgie Collard M.D.   On: 08/18/2022 18:58    Assessment and Plan:   Wanda Nelson is a 89 y.o. y/o female admitted in 08/2022 with acute gi bleed secondary to acute sigmoid diverticulitis that required embolization by vascular surgery, Hb 12 grams , here for follow up , no recent colonoscopy    Plan  Colonoscopy in mid November 2024 for rule out any precipitating causes for the acute diverticulitis.  Discussed the benefits versus risks particularly related to her age.  Explained that if there were to be a tumor or a mass eventually it would declare itself and she will still need a colonoscopy.  Since she is very active at baseline the basis and if it is may exceeds the risks.  Give her the option to think it over at home but she decided to proceed which we will plan in October   I have discussed alternative options, risks & benefits,  which include, but are not limited to, bleeding, infection, perforation,respiratory complication & drug reaction.  The patient agrees with this plan & written consent will be obtained.       Dr Wyline Mood  MD,MRCP [facility]) Follow up in as needed

## 2022-09-21 DIAGNOSIS — H0288A Meibomian gland dysfunction right eye, upper and lower eyelids: Secondary | ICD-10-CM | POA: Diagnosis not present

## 2022-09-21 DIAGNOSIS — H16223 Keratoconjunctivitis sicca, not specified as Sjogren's, bilateral: Secondary | ICD-10-CM | POA: Diagnosis not present

## 2022-09-21 DIAGNOSIS — H0288B Meibomian gland dysfunction left eye, upper and lower eyelids: Secondary | ICD-10-CM | POA: Diagnosis not present

## 2022-09-24 ENCOUNTER — Encounter: Payer: Self-pay | Admitting: Family Medicine

## 2022-10-15 ENCOUNTER — Telehealth: Payer: Self-pay | Admitting: Gastroenterology

## 2022-10-15 NOTE — Telephone Encounter (Signed)
PT requesting call back to discuss medication before procedure

## 2022-10-15 NOTE — Telephone Encounter (Signed)
Called patient back and she wanted to know if she could take her blood pressure medication before her colonoscopy. I let her know that she could but with a sip of water. Patient understood and had no further questions.

## 2022-10-16 ENCOUNTER — Encounter: Payer: Self-pay | Admitting: Gastroenterology

## 2022-10-23 ENCOUNTER — Encounter: Payer: Self-pay | Admitting: Gastroenterology

## 2022-10-23 ENCOUNTER — Ambulatory Visit: Payer: Medicare Other | Admitting: Certified Registered Nurse Anesthetist

## 2022-10-23 ENCOUNTER — Encounter
Admission: RE | Disposition: A | Discharge status: Home / Self Care | Payer: Self-pay | Source: Ambulatory Visit | Attending: Gastroenterology

## 2022-10-23 ENCOUNTER — Ambulatory Visit
Admission: RE | Admit: 2022-10-23 | Discharge: 2022-10-23 | Disposition: A | Discharge status: Home / Self Care | Payer: Medicare Other | Source: Ambulatory Visit | Attending: Gastroenterology | Admitting: Gastroenterology

## 2022-10-23 DIAGNOSIS — K6389 Other specified diseases of intestine: Secondary | ICD-10-CM

## 2022-10-23 DIAGNOSIS — Z8719 Personal history of other diseases of the digestive system: Secondary | ICD-10-CM | POA: Diagnosis not present

## 2022-10-23 DIAGNOSIS — J449 Chronic obstructive pulmonary disease, unspecified: Secondary | ICD-10-CM | POA: Diagnosis not present

## 2022-10-23 DIAGNOSIS — I071 Rheumatic tricuspid insufficiency: Secondary | ICD-10-CM | POA: Diagnosis not present

## 2022-10-23 DIAGNOSIS — H409 Unspecified glaucoma: Secondary | ICD-10-CM | POA: Diagnosis not present

## 2022-10-23 DIAGNOSIS — D122 Benign neoplasm of ascending colon: Secondary | ICD-10-CM | POA: Diagnosis not present

## 2022-10-23 DIAGNOSIS — K5732 Diverticulitis of large intestine without perforation or abscess without bleeding: Secondary | ICD-10-CM

## 2022-10-23 DIAGNOSIS — I10 Essential (primary) hypertension: Secondary | ICD-10-CM | POA: Insufficient documentation

## 2022-10-23 DIAGNOSIS — K529 Noninfective gastroenteritis and colitis, unspecified: Secondary | ICD-10-CM | POA: Diagnosis not present

## 2022-10-23 DIAGNOSIS — E785 Hyperlipidemia, unspecified: Secondary | ICD-10-CM | POA: Insufficient documentation

## 2022-10-23 DIAGNOSIS — K635 Polyp of colon: Secondary | ICD-10-CM

## 2022-10-23 DIAGNOSIS — K5730 Diverticulosis of large intestine without perforation or abscess without bleeding: Secondary | ICD-10-CM | POA: Diagnosis not present

## 2022-10-23 DIAGNOSIS — Z09 Encounter for follow-up examination after completed treatment for conditions other than malignant neoplasm: Secondary | ICD-10-CM | POA: Diagnosis not present

## 2022-10-23 DIAGNOSIS — K449 Diaphragmatic hernia without obstruction or gangrene: Secondary | ICD-10-CM | POA: Diagnosis not present

## 2022-10-23 DIAGNOSIS — R0609 Other forms of dyspnea: Secondary | ICD-10-CM | POA: Insufficient documentation

## 2022-10-23 DIAGNOSIS — I480 Paroxysmal atrial fibrillation: Secondary | ICD-10-CM | POA: Insufficient documentation

## 2022-10-23 DIAGNOSIS — D125 Benign neoplasm of sigmoid colon: Secondary | ICD-10-CM | POA: Diagnosis not present

## 2022-10-23 DIAGNOSIS — I471 Supraventricular tachycardia, unspecified: Secondary | ICD-10-CM | POA: Diagnosis not present

## 2022-10-23 DIAGNOSIS — Z8673 Personal history of transient ischemic attack (TIA), and cerebral infarction without residual deficits: Secondary | ICD-10-CM | POA: Diagnosis not present

## 2022-10-23 DIAGNOSIS — Z800 Family history of malignant neoplasm of digestive organs: Secondary | ICD-10-CM | POA: Diagnosis not present

## 2022-10-23 DIAGNOSIS — I739 Peripheral vascular disease, unspecified: Secondary | ICD-10-CM | POA: Insufficient documentation

## 2022-10-23 DIAGNOSIS — R0602 Shortness of breath: Secondary | ICD-10-CM | POA: Insufficient documentation

## 2022-10-23 DIAGNOSIS — I700 Atherosclerosis of aorta: Secondary | ICD-10-CM | POA: Insufficient documentation

## 2022-10-23 DIAGNOSIS — K219 Gastro-esophageal reflux disease without esophagitis: Secondary | ICD-10-CM | POA: Insufficient documentation

## 2022-10-23 DIAGNOSIS — J8410 Pulmonary fibrosis, unspecified: Secondary | ICD-10-CM | POA: Insufficient documentation

## 2022-10-23 DIAGNOSIS — D126 Benign neoplasm of colon, unspecified: Secondary | ICD-10-CM

## 2022-10-23 HISTORY — DX: Benign neoplasm of colon, unspecified: D12.6

## 2022-10-23 HISTORY — PX: COLONOSCOPY WITH PROPOFOL: SHX5780

## 2022-10-23 SURGERY — COLONOSCOPY WITH PROPOFOL
Anesthesia: General

## 2022-10-23 MED ORDER — IPRATROPIUM-ALBUTEROL 0.5-2.5 (3) MG/3ML IN SOLN
3.0000 mL | Freq: Once | RESPIRATORY_TRACT | Status: AC
  Administered 2022-10-23: 3 mL via RESPIRATORY_TRACT

## 2022-10-23 MED ORDER — PROPOFOL 10 MG/ML IV BOLUS
INTRAVENOUS | Status: DC | PRN
  Administered 2022-10-23: 50 mg via INTRAVENOUS
  Administered 2022-10-23: 10 mg via INTRAVENOUS

## 2022-10-23 MED ORDER — PROPOFOL 500 MG/50ML IV EMUL
INTRAVENOUS | Status: DC | PRN
  Administered 2022-10-23: 100 ug/kg/min via INTRAVENOUS

## 2022-10-23 MED ORDER — LIDOCAINE HCL (CARDIAC) PF 100 MG/5ML IV SOSY
PREFILLED_SYRINGE | INTRAVENOUS | Status: DC | PRN
  Administered 2022-10-23: 50 mg via INTRAVENOUS

## 2022-10-23 MED ORDER — SODIUM CHLORIDE 0.9 % IV SOLN: INTRAVENOUS | Status: DC

## 2022-10-23 MED ORDER — IPRATROPIUM-ALBUTEROL 0.5-2.5 (3) MG/3ML IN SOLN
RESPIRATORY_TRACT | Status: AC
  Filled 2022-10-23: qty 3

## 2022-10-23 NOTE — Anesthesia Postprocedure Evaluation (Signed)
Anesthesia Post Note  Patient: Wanda Nelson  Procedure(s) Performed: COLONOSCOPY WITH PROPOFOL  Patient location during evaluation: Endoscopy Anesthesia Type: General Level of consciousness: awake and alert Pain management: pain level controlled Vital Signs Assessment: post-procedure vital signs reviewed and stable Respiratory status: spontaneous breathing, nonlabored ventilation, respiratory function stable and patient connected to nasal cannula oxygen Cardiovascular status: blood pressure returned to baseline and stable Postop Assessment: no apparent nausea or vomiting Anesthetic complications: no   No notable events documented.   Last Vitals:  Vitals:   10/23/22 0924 10/23/22 0934  BP: 138/65 (!) 162/89  Pulse: 89 64  Resp: (!) 22 (!) 29  Temp:  (!) 36.2 C  SpO2: 100% 100%    Last Pain:  Vitals:   10/23/22 0934  TempSrc: Temporal  PainSc: 0-No pain                 Corinda Gubler

## 2022-10-23 NOTE — H&P (Signed)
Wyline Mood, MD [address], [place], [place], [address] [address], [place], [place], [address] Phone: [phone]  Fax: [phone]  Primary Care Physician:  Dana Allan, MD   Pre-Procedure History & Physical: HPI:  Wanda Nelson is a 89 y.o. female is here for an colonoscopy.   Past Medical History:  Diagnosis Date   Actinic keratosis    Anemia    Aortic atherosclerosis (HCC)    Atrial myxoma 03/17/2018   a.) TTE 03/17/2018 --> 2.4 x 2.5 cm LEFT atrial mass attached at fossa ovalis/atrial septum; pedunculated in shape and solid in texture; b.) s/p atrial myoxma resection 03/21/2018; initially attempted via minimally invasive RIGHT thoracotomy approach, however due to bleeding complications, procedure required median sternotomy and patient going on cardiopulmonary bypass   Basal cell carcinoma 05/17/2021   L upper back 5.0 cm lat to spine - ED&C   Diastolic dysfunction    a.) TTE 03/17/2018: EF 55-60%, LVH, mild PR, G1DD; b.) TTE 07/25/2018: EF 60-65%, RVSP 44.9, mild-mod MR/TR, G1DD; c.) TTE 12/18/2018: EF 60-65%; d.) TTE 06/12/2019: EF 50-55, mild BAE, triv TR, PASP 37.5, G2DD; e.) TTE 07/22/2020: EF 60-65%, mild MR, mod TR, RVSP 42.7, G1DD; f.) TTE 05/08/2021: EF 60-65%, mild LVH, mild-mod TR, AoV sclerosis without stenosis   Dyspnea    Fatigue 12/19/2021   GERD (gastroesophageal reflux disease)    Glaucoma    Hemorrhagic cerebrovascular accident (CVA) (HCC) 1990   Hiatal hernia    Hiatal hernia 05/16/2021   History of 2019 novel coronavirus disease (COVID-19)    a.) 05/30/20 - Tx'd with "covid 19 pill"; b.) 10/2021   History of blood transfusion    History of hiatal hernia    History of left atrial appendage closure 03/21/2018   a.) #45 Atricure   Hyperlipidemia    Hypertension    Hypoxia    Incidental pulmonary nodule, > 3mm and < 8mm 03/20/2018   Noted on CT scan - needs f/u imaging 6 months   Lung fibrosis (HCC) 11/16/2020    NSVT (nonsustained ventricular tachycardia) (HCC) 05/13/2018   a.) holter 05/13/2018 --> 5 short runs   PAF (paroxysmal atrial fibrillation) (HCC) 05/13/2018   a.) holter 05/13/2018 --> intermittent episodes with longest lasting approx 1.5 hours (burden <1%).   Pain due to onychomycosis of toenails of both feet 07/28/2020   Positive self-administered antigen test for COVID-19 10/18/2021   PSVT (paroxysmal supraventricular tachycardia) (HCC) 05/13/2018   a.) holter 05/13/2018 --> frequent runs of SVT noted during study   Pulmonary fibrosis (HCC)    Squamous cell carcinoma of skin 11/12/2019   L distal dorsum forearm - ED&C    Stroke (HCC)    Viral URI 05/31/2020    Past Surgical History:  Procedure Laterality Date   BUNIONECTOMY Bilateral    CLIPPING OF ATRIAL APPENDAGE  03/21/2018   Procedure: Clipping Of Atrial Appendage - using an AtriCure 45mm clip;  Surgeon: Purcell Nails, MD;  Location: [facility] OR;  Service: Open Heart Surgery;;   ESOPHAGOGASTRODUODENOSCOPY (EGD) WITH PROPOFOL N/A 09/06/2021   Procedure: ESOPHAGOGASTRODUODENOSCOPY (EGD) WITH PROPOFOL;  Surgeon: Wyline Mood, MD;  Location: [facility] ENDOSCOPY;  Service: Gastroenterology;  Laterality: N/A;   EXCISION OF ATRIAL MYXOMA N/A 03/21/2018   Procedure: Excision Of Atrial Myxoma;  Surgeon: Purcell Nails, MD;  Location: [facility] OR;  Service: Open Heart Surgery;  Laterality: N/A;   RECTOCELE REPAIR     RIGHT/LEFT HEART CATH AND CORONARY ANGIOGRAPHY N/A 03/19/2018  Procedure: RIGHT/LEFT HEART CATH AND CORONARY ANGIOGRAPHY;  Surgeon: Iran Ouch, MD;  Location: [facility] INVASIVE CV LAB;  Service: Cardiovascular;  Laterality: N/A;   TEE WITHOUT CARDIOVERSION N/A 03/21/2018   Procedure: TRANSESOPHAGEAL ECHOCARDIOGRAM (TEE);  Surgeon: Purcell Nails, MD;  Location: [facility] OR;  Service: Open Heart Surgery;  Laterality: N/A;   TONSILLECTOMY     TOTAL ABDOMINAL HYSTERECTOMY  01/09/1980   VISCERAL ANGIOGRAPHY N/A 08/17/2022   Procedure: VISCERAL  ANGIOGRAPHY;  Surgeon: Annice Needy, MD;  Location: [facility] INVASIVE CV LAB;  Service: Cardiovascular;  Laterality: N/A;   XI ROBOTIC ASSISTED PARAESOPHAGEAL HERNIA REPAIR N/A 11/14/2021   Procedure: XI ROBOTIC ASSISTED PARAESOPHAGEAL HERNIA REPAIR, RNFA to assist;  Surgeon: Leafy Ro, MD;  Location: [facility] ORS;  Service: General;  Laterality: N/A;    Prior to Admission medications   Medication Sig Start Date End Date Taking? Authorizing Provider  ipratropium-albuterol (DUONEB) 0.5-2.5 (3) MG/3ML SOLN INHALE THE CONTENTS OF ONE (1) VIAL VIA NEBULIZATION EVERY 6 HOURS AS NEEDED 08/15/21  Yes Worthy Rancher B, FNP  losartan (COZAAR) 25 MG tablet Take 0.5-1 tablets (12.5-25 mg total) by mouth 2 (two) times daily. If BP >140/>80 take losartan 25 mg bid 10/17/21  Yes McLean-Scocuzza, Pasty Spillers, MD  acetaminophen (TYLENOL) 325 MG tablet Take 2 tablets (650 mg total) by mouth every 6 (six) hours as needed for mild pain (or Fever >/= 101). 05/09/21   Leeroy Bock, MD  Ascorbic Acid (VITAMIN C PO) Take by mouth daily at 12 noon.    [provider]  aspirin EC 81 MG tablet Take 1 tablet (81 mg total) by mouth daily. 05/16/21   McLean-Scocuzza, Pasty Spillers, MD  atorvastatin (LIPITOR) 20 MG tablet Take 1 tablet (20 mg total) by mouth daily at 6 PM. 10/17/21   McLean-Scocuzza, Pasty Spillers, MD  Calcium Carbonate-Vitamin D 500-125 MG-UNIT TABS Take 1 tablet by mouth daily.    [provider]  cholecalciferol (VITAMIN D) 1000 units tablet Take 1,000 Units by mouth daily.    [provider]  loratadine (CLARITIN) 10 MG tablet Take 1 tablet (10 mg total) by mouth daily. As needed at night 10/17/21 10/12/22  McLean-Scocuzza, Pasty Spillers, MD  Multiple Vitamin (MULTIVITAMIN) tablet Take 1 tablet by mouth daily.    [provider]  pantoprazole (PROTONIX) 40 MG tablet Take 1 tablet (40 mg total) by mouth 2 (two) times daily. 05/06/22   Dana Allan, MD    Allergies as of 09/17/2022 - Review Complete  09/17/2022  Allergen Reaction Noted   Bee venom Anaphylaxis and Hives 03/15/2015   Codeine Other (See Comments) 03/15/2015   Morphine and codeine  03/15/2015   Shellfish allergy Anaphylaxis and Hives 03/15/2015   Bactrim [sulfamethoxazole-trimethoprim]  06/24/2019   Norvasc [amlodipine]  07/20/2020   Nitrofurantoin Rash 03/15/2015    Family History  Problem Relation Age of Onset   Colon cancer Brother    Cancer Brother        neck tumor cancerous    Esophageal varices Brother    Colon cancer Maternal Uncle    Colon cancer Maternal Grandmother    Kidney disease Brother    Coronary artery disease Brother        with stent, cabg   Breast cancer Neg Hx     Social History   Socioeconomic History   Marital status: Widowed    Spouse name: Not on file   Number of children: Not on file   Years of education: Not  on file   Highest education level: Bachelor's degree (e.g., BA, AB, BS)  Occupational History   Not on file  Tobacco Use   Smoking status: Never   Smokeless tobacco: Never  Vaping Use   Vaping status: Never Used  Substance and Sexual Activity   Alcohol use: No    Alcohol/week: 0.0 standard drinks of alcohol   Drug use: No   Sexual activity: Not Currently  Other Topics Concern   Not on file  Social History Narrative   Lives [place],  husband died 12/02/2018 due to brain hemorrhage   Daughter in law Tylor Gambrill    widowed   Social Determinants of Health   Financial Resource Strain: Low Risk  (05/17/2022)   Overall Financial Resource Strain (CARDIA)    Difficulty of Paying Living Expenses: Not hard at all  Food Insecurity: No Food Insecurity (08/21/2022)   Hunger Vital Sign    Worried About Running Out of Food in the Last Year: Never true    Ran Out of Food in the Last Year: Never true  Transportation Needs: No Transportation Needs (08/21/2022)   PRAPARE - Administrator, Civil Service (Medical): No    Lack of Transportation (Non-Medical): No   Physical Activity: Sufficiently Active (05/17/2022)   Exercise Vital Sign    Days of Exercise per Week: 7 days    Minutes of Exercise per Session: 30 min  Stress: No Stress Concern Present (05/17/2022)   [employer] of Occupational Health - Occupational Stress Questionnaire    Feeling of Stress : Not at all  Social Connections: Socially Integrated (05/17/2022)   Social Connection and Isolation Panel [NHANES]    Frequency of Communication with Friends and Family: More than three times a week    Frequency of Social Gatherings with Friends and Family: More than three times a week    Attends Religious Services: More than 4 times per year    Active Member of [employer] or Organizations: Yes    Attends Engineer, structural: More than 4 times per year    Marital Status: Married  Catering manager Violence: Not At Risk (08/17/2022)   Humiliation, Afraid, Rape, and Kick questionnaire    Fear of Current or Ex-Partner: No    Emotionally Abused: No    Physically Abused: No    Sexually Abused: No    Review of Systems: See HPI, otherwise negative ROS  Physical Exam: BP (!) 185/94   Pulse 87   Temp (!) 96.9 F (36.1 C) (Temporal)   Resp (!) 21   Ht 5\' 1"  (1.549 m)   Wt 42.6 kg   LMP  (LMP Unknown)   SpO2 96%   BMI 17.76 kg/m  General:   Alert,  pleasant and cooperative in NAD Head:  Normocephalic and atraumatic. Neck:  Supple; no masses or thyromegaly. Lungs:  Clear throughout to auscultation, normal respiratory effort.    Heart:  +S1, +S2, Regular rate and rhythm, No edema. Abdomen:  Soft, nontender and nondistended. Normal bowel sounds, without guarding, and without rebound.   Neurologic:  Alert and  oriented x4;  grossly normal neurologically.  Impression/Plan: Wanda Nelson is here for an colonoscopy to be performed for follow up of diverticulitis Risks, benefits, limitations, and alternatives regarding  colonoscopy have been reviewed with the patient.  Questions have  been answered.  All parties agreeable.   Wyline Mood, MD  10/23/2022, 8:09 AM

## 2022-10-23 NOTE — Transfer of Care (Signed)
Immediate Anesthesia Transfer of Care Note  Patient: Wanda Nelson  Procedure(s) Performed: COLONOSCOPY WITH PROPOFOL  Patient Location: PACU  Anesthesia Type:General  Level of Consciousness: awake, alert , and oriented  Airway & Oxygen Therapy: Patient Spontanous Breathing  Post-op Assessment: Report given to RN and Post -op Vital signs reviewed and stable  Post vital signs: Reviewed and stable  Last Vitals:  Vitals Value Taken Time  BP 120/53 10/23/22 0916  Temp 36.2 C 10/23/22 0914  Pulse 65 10/23/22 0917  Resp 34 10/23/22 0917  SpO2 100 % 10/23/22 0917  Vitals shown include unfiled device data.  Last Pain:  Vitals:   10/23/22 0914  TempSrc: Temporal  PainSc: 0-No pain         Complications: No notable events documented.

## 2022-10-23 NOTE — Op Note (Signed)
[facility] Patient Name: Wanda Nelson Procedure Date: 10/23/2022 8:47 AM MRN: [id number] Account #: [id number] Date of Birth: 08-28-1931 Admit Type: Outpatient Age: 89 Room: [facility] ENDO ROOM 2 Gender: Female Note Status: Finalized Instrument Name: Peds Colonoscope [id number] Procedure:             Colonoscopy Indications:           Follow-up of diverticulitis Providers:             Wyline Mood MD, MD Referring MD:          Dana Allan (Referring MD) Medicines:             Monitored Anesthesia Care Complications:         No immediate complications. Procedure:             Pre-Anesthesia Assessment:                        - Prior to the procedure, a History and Physical was                         performed, and patient medications, allergies and                         sensitivities were reviewed. The patient's tolerance                         of previous anesthesia was reviewed.                        - The risks and benefits of the procedure and the                         sedation options and risks were discussed with the                         patient. All questions were answered and informed                         consent was obtained.                        - ASA Grade Assessment: II - A patient with mild                         systemic disease.                        After obtaining informed consent, the colonoscope was                         passed under direct vision. Throughout the procedure,                         the patient's blood pressure, pulse, and oxygen                         saturations were monitored continuously. The                         Colonoscope was introduced through the  anus and                         advanced to the the cecum, identified by the                         appendiceal orifice. The colonoscopy was performed                         with ease. The patient tolerated the procedure well.                          The quality of the bowel preparation was good. The                         ileocecal valve, appendiceal orifice, and rectum were                         photographed. Findings:      The perianal and digital rectal examinations were normal.      A 4 mm polyp was found in the ascending colon. The polyp was sessile.       The polyp was removed with a jumbo cold forceps. Resection and retrieval       were complete.      Four sessile polyps were found in the ascending colon. The polyps were 6       to 8 mm in size. These polyps were removed with a cold snare. Resection       and retrieval were complete.      Multiple medium-mouthed diverticula were found in the sigmoid colon.      A small polypoid lesion was found in the sigmoid colon. The lesion was       sessile. No bleeding was present. Was next to infglammed diverticula and       surrounding mcuosa also appeared inflammaed, at 30 cm mark from anus, no       bx taken due to active inflammation      The exam was otherwise without abnormality on direct and retroflexion       views. Impression:            - One 4 mm polyp in the ascending colon, removed with                         a jumbo cold forceps. Resected and retrieved.                        - Four 6 to 8 mm polyps in the ascending colon,                         removed with a cold snare. Resected and retrieved.                        - Diverticulosis in the sigmoid colon.                        - Polypoid lesion in the sigmoid colon.                        - The examination was  otherwise normal on direct and                         retroflexion views. Recommendation:        - Discharge patient to home (with escort).                        - Resume previous diet.                        - Continue present medications.                        - Await pathology results.                        - Flexible sigmoidsocopy in 2 months to examine area                         at 30 cm  mark to ensure polyp seen is inflammatory and                         not pre cancerous Procedure Code(s):     --- Professional ---                        [id number], Colonoscopy, flexible; with removal of                         tumor(s), polyp(s), or other lesion(s) by snare                         technique                        45380, 59, Colonoscopy, flexible; with biopsy, single                         or multiple Diagnosis Code(s):     --- Professional ---                        D12.2, Benign neoplasm of ascending colon                        D49.0, Neoplasm of unspecified behavior of digestive                         system                        K57.32, Diverticulitis of large intestine without                         perforation or abscess without bleeding                        K57.30, Diverticulosis of large intestine without                         perforation or abscess without bleeding CPT copyright 2022 American Medical Association. All rights reserved. The codes documented in this report are preliminary and upon  coder review may  be revised to meet current compliance requirements. Wyline Mood, MD Wyline Mood MD, MD 10/23/2022 9:15:34 AM This report has been signed electronically. Number of Addenda: 0 Note Initiated On: 10/23/2022 8:47 AM Scope Withdrawal Time: 0 hours 14 minutes 7 seconds  Total Procedure Duration: 0 hours 19 minutes 53 seconds  Estimated Blood Loss:  Estimated blood loss: none.      [facility]

## 2022-10-23 NOTE — Anesthesia Preprocedure Evaluation (Signed)
Anesthesia Evaluation  Patient identified by MRN, date of birth, ID band Patient awake  General Assessment Comment:  Patient states her breathing feels more labored than usual today. Lungs CTAB. Will give duoneb.  Reviewed: Allergy & Precautions, NPO status , Patient's Chart, lab work & pertinent test results  History of Anesthesia Complications Negative for: history of anesthetic complications  Airway Mallampati: III  TM Distance: <3 FB Neck ROM: full    Dental  (+) Chipped, Poor Dentition, Caps, Missing   Pulmonary shortness of breath and with exertion, COPD   Pulmonary exam normal breath sounds clear to auscultation       Cardiovascular Exercise Tolerance: Good hypertension, Pt. on medications (-) angina + Peripheral Vascular Disease and + DOE  (-) Past MI Normal cardiovascular exam Rhythm:Regular Rate:Normal - Systolic murmurs S/p cardiac surgery for atrial myxoma  Good exercise tolerance, walks 2 miles each day   Neuro/Psych  Neuromuscular disease CVA, No Residual Symptoms  negative psych ROS   GI/Hepatic Neg liver ROS, hiatal hernia,GERD  Controlled,,S/p hiatal hernia repair   Endo/Other  negative endocrine ROS    Renal/GU      Musculoskeletal   Abdominal   Peds  Hematology negative hematology ROS (+)   Anesthesia Other Findings Past Medical History: No date: Actinic keratosis No date: Anemia No date: Aortic atherosclerosis (HCC) 03/17/2018: Atrial myxoma     Comment:  a.) TTE 03/17/2018 --> 2.4 x 2.5 cm LEFT atrial mass               attached at fossa ovalis/atrial septum; pedunculated in               shape and solid in texture; b.) s/p atrial myoxma               resection 03/21/2018; initially attempted via minimally               invasive RIGHT thoracotomy approach, however due to               bleeding complications, procedure required median               sternotomy and patient going on  cardiopulmonary bypass 05/17/2021: Basal cell carcinoma     Comment:  L upper back 5.0 cm lat to spine - ED&C No date: Diastolic dysfunction     Comment:  a.) TTE 03/17/2018: EF 55-60%, LVH, mild PR, G1DD; b.)               TTE 07/25/2018: EF 60-65%, RVSP 44.9, mild-mod MR/TR,               G1DD; c.) TTE 12/18/2018: EF 60-65%; d.) TTE 06/12/2019:               EF 50-55, mild BAE, triv TR, PASP 37.5, G2DD; e.) TTE               07/22/2020: EF 60-65%, mild MR, mod TR, RVSP 42.7, G1DD;               f.) TTE 05/08/2021: EF 60-65%, mild LVH, mild-mod TR, AoV              sclerosis without stenosis No date: Dyspnea No date: GERD (gastroesophageal reflux disease) No date: Glaucoma 1990: Hemorrhagic cerebrovascular accident (CVA) (HCC) No date: Hiatal hernia No date: History of 2019 novel coronavirus disease (COVID-19)     Comment:  a.) 05/30/20 - Tx'd with "covid 19 pill"; b.) 10/2021 No date: History  of blood transfusion 03/21/2018: History of left atrial appendage closure     Comment:  a.) #45 Atricure No date: Hyperlipidemia No date: Hypertension 03/20/2018: Incidental pulmonary nodule, > 3mm and < 8mm     Comment:  Noted on CT scan - needs f/u imaging 6 months 05/13/2018: NSVT (nonsustained ventricular tachycardia) (HCC)     Comment:  a.) holter 05/13/2018 --> 5 short runs 05/13/2018: PAF (paroxysmal atrial fibrillation) (HCC)     Comment:  a.) holter 05/13/2018 --> intermittent episodes with               longest lasting approx 1.5 hours (burden <1%). 05/13/2018: PSVT (paroxysmal supraventricular tachycardia)     Comment:  a.) holter 05/13/2018 --> frequent runs of SVT noted               during study No date: Pulmonary fibrosis (HCC) 11/12/2019: Squamous cell carcinoma of skin     Comment:  L distal dorsum forearm - ED&C   Past Surgical History: No date: BUNIONECTOMY; Bilateral 03/21/2018: CLIPPING OF ATRIAL APPENDAGE     Comment:  Procedure: Clipping Of Atrial Appendage - using  an               AtriCure 45mm clip;  Surgeon: Purcell Nails, MD;                Location: [facility] OR;  Service: Open Heart Surgery;; 09/06/2021: ESOPHAGOGASTRODUODENOSCOPY (EGD) WITH PROPOFOL; N/A     Comment:  Procedure: ESOPHAGOGASTRODUODENOSCOPY (EGD) WITH               PROPOFOL;  Surgeon: Wyline Mood, MD;  Location: [facility]               ENDOSCOPY;  Service: Gastroenterology;  Laterality: N/A; 03/21/2018: EXCISION OF ATRIAL MYXOMA; N/A     Comment:  Procedure: Excision Of Atrial Myxoma;  Surgeon: Purcell Nails, MD;  Location: [facility] OR;  Service: Open Heart               Surgery;  Laterality: N/A; No date: RECTOCELE REPAIR 03/19/2018: RIGHT/LEFT HEART CATH AND CORONARY ANGIOGRAPHY; N/A     Comment:  Procedure: RIGHT/LEFT HEART CATH AND CORONARY               ANGIOGRAPHY;  Surgeon: Iran Ouch, MD;  Location:               [facility] INVASIVE CV LAB;  Service: Cardiovascular;                Laterality: N/A; 03/21/2018: TEE WITHOUT CARDIOVERSION; N/A     Comment:  Procedure: TRANSESOPHAGEAL ECHOCARDIOGRAM (TEE);                Surgeon: Purcell Nails, MD;  Location: [facility] OR;                Service: Open Heart Surgery;  Laterality: N/A; No date: TONSILLECTOMY 01/09/1980: TOTAL ABDOMINAL HYSTERECTOMY     Reproductive/Obstetrics negative OB ROS                             Anesthesia Physical Anesthesia Plan  ASA: 3  Anesthesia Plan: General   Post-op Pain Management: Minimal or no pain anticipated   Induction: Intravenous  PONV Risk Score and Plan: 2 and Propofol infusion, TIVA and Ondansetron  Airway Management Planned: Nasal Cannula  Additional Equipment: None  Intra-op Plan:   Post-operative Plan:   Informed Consent: I have reviewed the patients History and Physical, chart, labs and discussed the procedure including the risks, benefits and alternatives for the proposed anesthesia with the patient or authorized representative who has  indicated his/her understanding and acceptance.     Dental advisory given  Plan Discussed with: CRNA and Surgeon  Anesthesia Plan Comments: (Discussed risks of anesthesia with patient, including possibility of difficulty with spontaneous ventilation under anesthesia necessitating airway intervention, PONV, and rare risks such as cardiac or respiratory or neurological events, and allergic reactions. Discussed the role of CRNA in patient's perioperative care. Patient understands.)       Anesthesia Quick Evaluation

## 2022-10-24 ENCOUNTER — Encounter: Payer: Self-pay | Admitting: Gastroenterology

## 2022-10-24 LAB — SURGICAL PATHOLOGY

## 2022-10-29 ENCOUNTER — Encounter: Payer: Self-pay | Admitting: Family Medicine

## 2022-11-01 DIAGNOSIS — Z23 Encounter for immunization: Secondary | ICD-10-CM | POA: Diagnosis not present

## 2022-11-02 ENCOUNTER — Other Ambulatory Visit: Payer: Self-pay | Admitting: Family Medicine

## 2022-11-02 MED ORDER — AZITHROMYCIN 250 MG PO TABS: ORAL_TABLET | ORAL | 0 refills | Status: AC

## 2022-11-02 MED ORDER — CIPROFLOXACIN HCL 250 MG PO TABS: 250.0000 mg | ORAL_TABLET | Freq: Two times a day (BID) | ORAL | 0 refills | Status: AC

## 2022-11-02 MED ORDER — METRONIDAZOLE 500 MG PO TABS: 500.0000 mg | ORAL_TABLET | Freq: Three times a day (TID) | ORAL | 0 refills | Status: AC

## 2022-11-03 ENCOUNTER — Encounter: Payer: Self-pay | Admitting: Family Medicine

## 2022-11-05 ENCOUNTER — Encounter: Payer: Self-pay | Admitting: Podiatry

## 2022-11-05 ENCOUNTER — Other Ambulatory Visit: Payer: Self-pay | Admitting: Family Medicine

## 2022-11-05 ENCOUNTER — Ambulatory Visit: Payer: Medicare Other | Admitting: Podiatry

## 2022-11-05 VITALS — Ht 61.0 in | Wt 94.0 lb

## 2022-11-05 DIAGNOSIS — B351 Tinea unguium: Secondary | ICD-10-CM | POA: Diagnosis not present

## 2022-11-05 DIAGNOSIS — I10 Essential (primary) hypertension: Secondary | ICD-10-CM

## 2022-11-05 DIAGNOSIS — M79675 Pain in left toe(s): Secondary | ICD-10-CM

## 2022-11-05 DIAGNOSIS — M79674 Pain in right toe(s): Secondary | ICD-10-CM | POA: Diagnosis not present

## 2022-11-05 NOTE — Progress Notes (Signed)
Subjective:  Patient ID: Wanda Nelson, female    DOB: 08/31/1931,  MRN: [id number]  89 y.o. female presents painful thick toenails that are difficult to trim. Pain interferes with ambulation. Aggravating factors include wearing enclosed shoe gear. Pain is relieved with periodic professional debridement.  Chief Complaint  Patient presents with   Nail Problem    RFC, Pt is not a diabetic, Last office visit was in May, and PCP is Dr Clent Ridges.   New problem(s): None   PCP is Dana Allan, MD , and last visit was August 27, 2022.  Allergies  Allergen Reactions   Bee Venom Anaphylaxis and Hives   Codeine Other (See Comments)    Altered mental status Altered mental status   Morphine And Codeine     Altered mental status   Shellfish Allergy Anaphylaxis and Hives    Mainly shrimp   Bactrim [Sulfamethoxazole-Trimethoprim]     ? Reaction     Norvasc [Amlodipine]     2.5 leg edema    Nitrofurantoin Rash    Review of Systems: Negative except as noted in the HPI.   Objective:  Wanda Nelson is a pleasant 89 y.o. female thin build in NAD.Marland Kitchen AAO x 3.  Vascular Examination: Vascular status intact b/l with palpable pedal pulses. CFT immediate b/l. Pedal hair present. No edema. No pain with calf compression b/l. Skin temperature gradient WNL b/l. No varicosities noted. No cyanosis or clubbing noted.  Neurological Examination: Sensation grossly intact b/l with 10 gram monofilament. Vibratory sensation intact b/l.  Dermatological Examination: Pedal skin with normal turgor, texture and tone b/l. No open wounds nor interdigital macerations noted. Toenails 1-5 b/l thick, discolored, elongated with subungual debris and pain on dorsal palpation. No hyperkeratotic lesions noted b/l.   Musculoskeletal Examination: Muscle strength 5/5 to b/l LE.  No pain, crepitus noted b/l. No gross pedal deformities. Patient ambulates independently without assistive aids.   Radiographs:  None  Last A1c:       No data to display          Assessment:   1. Pain due to onychomycosis of toenails of both feet     Plan:  Patient was evaluated and treated. All patient's and/or POA's questions/concerns addressed on today's visit. Mycotic toenails 1-5 debrided in length and girth without incident. Continue soft, supportive shoe gear daily. Report any pedal injuries to medical professional. Call office if there are any quesitons/concerns. -Patient/POA to call should there be question/concern in the interim.  Return in about 3 months (around 02/05/2023).  Freddie Breech, DPM

## 2022-11-22 ENCOUNTER — Other Ambulatory Visit: Payer: Self-pay | Admitting: Family Medicine

## 2022-11-22 DIAGNOSIS — E785 Hyperlipidemia, unspecified: Secondary | ICD-10-CM

## 2022-11-22 DIAGNOSIS — J309 Allergic rhinitis, unspecified: Secondary | ICD-10-CM

## 2022-11-23 DIAGNOSIS — Z23 Encounter for immunization: Secondary | ICD-10-CM | POA: Diagnosis not present

## 2022-11-29 ENCOUNTER — Ambulatory Visit: Payer: Medicare Other | Attending: Cardiovascular Disease | Admitting: Cardiovascular Disease

## 2022-11-29 ENCOUNTER — Encounter: Payer: Self-pay | Admitting: Cardiovascular Disease

## 2022-11-29 VITALS — BP 160/90 | HR 55 | Ht 61.0 in | Wt 99.1 lb

## 2022-11-29 DIAGNOSIS — D151 Benign neoplasm of heart: Secondary | ICD-10-CM

## 2022-11-29 DIAGNOSIS — I4891 Unspecified atrial fibrillation: Secondary | ICD-10-CM | POA: Diagnosis not present

## 2022-11-29 DIAGNOSIS — I10 Essential (primary) hypertension: Secondary | ICD-10-CM | POA: Diagnosis not present

## 2022-11-29 DIAGNOSIS — R001 Bradycardia, unspecified: Secondary | ICD-10-CM | POA: Diagnosis not present

## 2022-11-29 DIAGNOSIS — E785 Hyperlipidemia, unspecified: Secondary | ICD-10-CM | POA: Diagnosis not present

## 2022-11-29 DIAGNOSIS — I9789 Other postprocedural complications and disorders of the circulatory system, not elsewhere classified: Secondary | ICD-10-CM | POA: Diagnosis not present

## 2022-11-29 NOTE — Progress Notes (Signed)
Cardiology Office Note   Date:  11/29/2022   ID:  Wanda, Nelson 03-Jul-1931, MRN [id number]  PCP:  Dana Allan, MD  Cardiologist:   Lorine Bears, MD   Chief Complaint  Patient presents with   Follow-up    6 month f/u No complaints today. Meds reviewed verbally with pt.       History of Present Illness: Wanda Nelson is a 89 y.o. female who presents for a follow-up visit post atrial myxoma resection. She has known history of anemia, hiatal hernia, hyperlipidemia and remote history of hemorrhagic stroke.  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  Cardiac catheterization before surgery showed no evidence of obstructive coronary artery disease.  She had intermittent palpitations and tachycardia shortly after surgery due to short runs of nonsustained ventricular tachycardia and intermittent A. fib with RVR. She did not require treatment for this and her symptoms resolved without intervention.  Echocardiogram in December 2020 showed normal LV systolic function with no significant valvular abnormalities.  There was a concern about possible recurrent atrial mass.  However, it appeared to be prominent scarring at the surgical site.  Most recent echocardiogram in June 2021showed no evidence of mass.  EF was 50 to 55%.  He had COVID-19 infection in May of 2022 after a trip to [place].  She she had significant shortness of breath but ultimately recovered.    She was hospitalized in 2023 for shortness of breath. She underwent an echocardiogram which showed normal LV systolic function with mild LVH and stable mild to moderate tricuspid regurgitation.  CT of the chest showed evidence of pulmonary fibrosis.  I felt that her dyspnea was noncardiac.  She was evaluated by pulmonary and dyspnea was felt to be due to pulmonary fibrosis from large lateral hernia causing chronic reflux.    She underwent hiatal  hernia surgery in November, 2023.    She was hospitalized in August of this year with diverticulitis with lower GI bleed.  She required IMA embolization.  She has been doing well with no chest pain or worsening dyspnea.  She walks 2 miles daily for exercise.  She is planning to go on a [employer] cruise to [place] in [place] for whale watching.  Past Medical History:  Diagnosis Date   Actinic keratosis    Anemia    Aortic atherosclerosis (HCC)    Atrial myxoma 03/17/2018   a.) TTE 03/17/2018 --> 2.4 x 2.5 cm LEFT atrial mass attached at fossa ovalis/atrial septum; pedunculated in shape and solid in texture; b.) s/p atrial myoxma resection 03/21/2018; initially attempted via minimally invasive RIGHT thoracotomy approach, however due to bleeding complications, procedure required median sternotomy and patient going on cardiopulmonary bypass   Basal cell carcinoma 05/17/2021   L upper back 5.0 cm lat to spine - ED&C   Diastolic dysfunction    a.) TTE 03/17/2018: EF 55-60%, LVH, mild PR, G1DD; b.) TTE 07/25/2018: EF 60-65%, RVSP 44.9, mild-mod MR/TR, G1DD; c.) TTE 12/18/2018: EF 60-65%; d.) TTE 06/12/2019: EF 50-55, mild BAE, triv TR, PASP 37.5, G2DD; e.) TTE 07/22/2020: EF 60-65%, mild MR, mod TR, RVSP 42.7, G1DD; f.) TTE 05/08/2021: EF 60-65%, mild LVH, mild-mod TR, AoV sclerosis without stenosis   Dyspnea    Fatigue 12/19/2021   GERD (gastroesophageal reflux disease)    Glaucoma    Hemorrhagic cerebrovascular accident (CVA) (HCC) 1990   Hiatal hernia    Hiatal hernia  05/16/2021   History of 2019 novel coronavirus disease (COVID-19)    a.) 05/30/20 - Tx'd with "covid 19 pill"; b.) 10/2021   History of blood transfusion    History of hiatal hernia    History of left atrial appendage closure 03/21/2018   a.) #45 Atricure   Hyperlipidemia    Hypertension    Hypoxia    Incidental pulmonary nodule, > 3mm and < 8mm 03/20/2018   Noted on CT scan - needs f/u imaging 6 months   Lung  fibrosis (HCC) 11/16/2020   NSVT (nonsustained ventricular tachycardia) (HCC) 05/13/2018   a.) holter 05/13/2018 --> 5 short runs   PAF (paroxysmal atrial fibrillation) (HCC) 05/13/2018   a.) holter 05/13/2018 --> intermittent episodes with longest lasting approx 1.5 hours (burden <1%).   Pain due to onychomycosis of toenails of both feet 07/28/2020   Positive self-administered antigen test for COVID-19 10/18/2021   PSVT (paroxysmal supraventricular tachycardia) (HCC) 05/13/2018   a.) holter 05/13/2018 --> frequent runs of SVT noted during study   Pulmonary fibrosis (HCC)    Squamous cell carcinoma of skin 11/12/2019   L distal dorsum forearm - ED&C    Stroke (HCC)    Viral URI 05/31/2020    Past Surgical History:  Procedure Laterality Date   BUNIONECTOMY Bilateral    CLIPPING OF ATRIAL APPENDAGE  03/21/2018   Procedure: Clipping Of Atrial Appendage - using an AtriCure 45mm clip;  Surgeon: Purcell Nails, MD;  Location: [facility] OR;  Service: Open Heart Surgery;;   COLONOSCOPY WITH PROPOFOL N/A 10/23/2022   Procedure: COLONOSCOPY WITH PROPOFOL;  Surgeon: Wyline Mood, MD;  Location: [facility] ENDOSCOPY;  Service: Gastroenterology;  Laterality: N/A;   ESOPHAGOGASTRODUODENOSCOPY (EGD) WITH PROPOFOL N/A 09/06/2021   Procedure: ESOPHAGOGASTRODUODENOSCOPY (EGD) WITH PROPOFOL;  Surgeon: Wyline Mood, MD;  Location: [facility] ENDOSCOPY;  Service: Gastroenterology;  Laterality: N/A;   EXCISION OF ATRIAL MYXOMA N/A 03/21/2018   Procedure: Excision Of Atrial Myxoma;  Surgeon: Purcell Nails, MD;  Location: [facility] OR;  Service: Open Heart Surgery;  Laterality: N/A;   RECTOCELE REPAIR     RIGHT/LEFT HEART CATH AND CORONARY ANGIOGRAPHY N/A 03/19/2018   Procedure: RIGHT/LEFT HEART CATH AND CORONARY ANGIOGRAPHY;  Surgeon: Iran Ouch, MD;  Location: [facility] INVASIVE CV LAB;  Service: Cardiovascular;  Laterality: N/A;   TEE WITHOUT CARDIOVERSION N/A 03/21/2018   Procedure: TRANSESOPHAGEAL ECHOCARDIOGRAM (TEE);   Surgeon: Purcell Nails, MD;  Location: [facility] OR;  Service: Open Heart Surgery;  Laterality: N/A;   TONSILLECTOMY     TOTAL ABDOMINAL HYSTERECTOMY  01/09/1980   VISCERAL ANGIOGRAPHY N/A 08/17/2022   Procedure: VISCERAL ANGIOGRAPHY;  Surgeon: Annice Needy, MD;  Location: [facility] INVASIVE CV LAB;  Service: Cardiovascular;  Laterality: N/A;   XI ROBOTIC ASSISTED PARAESOPHAGEAL HERNIA REPAIR N/A 11/14/2021   Procedure: XI ROBOTIC ASSISTED PARAESOPHAGEAL HERNIA REPAIR, RNFA to assist;  Surgeon: Leafy Ro, MD;  Location: [facility] ORS;  Service: General;  Laterality: N/A;     Current Outpatient Medications  Medication Sig Dispense Refill   acetaminophen (TYLENOL) 325 MG tablet Take 2 tablets (650 mg total) by mouth every 6 (six) hours as needed for mild pain (or Fever >/= 101).     ALLERGY RELIEF 10 MG tablet TAKE 1 TABLET BY MOUTH AT BEDTIME AS NEEDED 90 tablet 3   Ascorbic Acid (VITAMIN C PO) Take by mouth daily at 12 noon.     aspirin EC 81 MG tablet Take 1 tablet (81 mg total) by mouth daily. 90 tablet  3   atorvastatin (LIPITOR) 20 MG tablet TAKE 1 TABLET BY MOUTH DAILY AT 6PM. 90 tablet 3   Calcium Carbonate-Vitamin D 500-125 MG-UNIT TABS Take 1 tablet by mouth daily.     cholecalciferol (VITAMIN D) 1000 units tablet Take 1,000 Units by mouth daily.     ipratropium-albuterol (DUONEB) 0.5-2.5 (3) MG/3ML SOLN INHALE THE CONTENTS OF ONE (1) VIAL VIA NEBULIZATION EVERY 6 HOURS AS NEEDED 360 mL 0   losartan (COZAAR) 25 MG tablet TAKE 1/2 TO 1 TABLET BY MOUTH TWO TIMES DAILY; IF BLOOD PRESSURE IS OVER 140/80,TAKE ONE (1) TABLET TWICE DAILY 180 tablet 3   Multiple Vitamin (MULTIVITAMIN) tablet Take 1 tablet by mouth daily.     pantoprazole (PROTONIX) 40 MG tablet Take 1 tablet (40 mg total) by mouth 2 (two) times daily. 180 tablet 0   No current facility-administered medications for this visit.    Allergies:   Bee venom, Codeine, Morphine and codeine, Shellfish allergy, Bactrim  [sulfamethoxazole-trimethoprim], Norvasc [amlodipine], and Nitrofurantoin    Social History:  The patient  reports that she has never smoked. She has never used smokeless tobacco. She reports that she does not drink alcohol and does not use drugs.   Family History:  The patient's family history includes Cancer in her brother; Colon cancer in her brother, maternal grandmother, and maternal uncle; Coronary artery disease in her brother; Esophageal varices in her brother; Kidney disease in her brother.    ROS:  Please see the history of present illness.   Otherwise, review of systems are positive for none.   All other systems are reviewed and negative.    PHYSICAL EXAM: VS:  BP (!) 160/90 (BP Location: Left Arm, Patient Position: Sitting, Cuff Size: Normal) Comment: Post EKG  Pulse (!) 55   Ht 5\' 1"  (1.549 m)   Wt 99 lb 2 oz (45 kg)   LMP  (LMP Unknown)   SpO2 97%   BMI 18.73 kg/m  , BMI Body mass index is 18.73 kg/m. GEN: Well nourished, well developed, in no acute distress  HEENT: normal  Neck: no JVD, carotid bruits, or masses Cardiac: RRR; no murmurs, rubs, or gallops,no edema  Respiratory:  clear to auscultation bilaterally, normal work of breathing GI: soft, nontender, nondistended, + BS MS: no deformity or atrophy  Skin: warm and dry, no rash Neuro:  Strength and sensation are intact Psych: euthymic mood, full affect   EKG:  EKG is ordered today. The ekg ordered today demonstrates : Normal sinus rhythm with PACs When compared with ECG of 17-Aug-2022 06:46, Minimal criteria for Anterior infarct are no longer Present Nonspecific T wave abnormality no longer evident in Inferior leads Nonspecific T wave abnormality, improved in Anterior leads QT has shortened      Recent Labs: 12/11/2021: TSH 1.27 04/11/2022: Magnesium 2.0 08/17/2022: ALT 15 08/27/2022: BUN 12; Creatinine, Ser 0.70; Hemoglobin 12.0; Platelets 278.0; Potassium 4.0; Sodium 136    Lipid Panel    Component  Value Date/Time   CHOL 167 05/16/2021 1042   TRIG 113.0 05/16/2021 1042   HDL 59.80 05/16/2021 1042   CHOLHDL 3 05/16/2021 1042   VLDL 22.6 05/16/2021 1042   LDLCALC 84 05/16/2021 1042      Wt Readings from Last 3 Encounters:  11/29/22 99 lb 2 oz (45 kg)  11/05/22 94 lb (42.6 kg)  10/23/22 94 lb (42.6 kg)          No data to display  ASSESSMENT AND PLAN:  1.  Status post surgical resection of atrial myxoma: No evidence of recurrent myxoma on most recent echocardiogram in 2023.  Will consider repeat echocardiogram next year.  2.  Postoperative atrial fibrillation: No evidence of recurrent arrhythmia.  She is in sinus rhythm today.  3.  Hyperlipidemia: She is currently on atorvastatin 20 mg daily.  Most recent lipid profile showed an LDL of 84.  4. Essential hypertension: Her blood pressure is elevated today but is usually controlled at home.  She takes an additional dose of losartan as needed if systolic blood pressure is above 140.  I made no changes today.  5.  Sinus bradycardia: Asymptomatic at this time with no indication for a pacemaker.    Disposition:   FU with me in 6 months  Signed,  Lorine Bears, MD  11/29/2022 9:26 AM    [facility]

## 2022-11-29 NOTE — Patient Instructions (Signed)

## 2022-12-26 ENCOUNTER — Ambulatory Visit: Payer: Medicare Other | Admitting: Family Medicine

## 2022-12-27 ENCOUNTER — Encounter: Payer: Self-pay | Admitting: Family Medicine

## 2022-12-27 ENCOUNTER — Ambulatory Visit: Payer: Medicare Other | Admitting: Family Medicine

## 2022-12-27 VITALS — BP 135/77 | HR 58 | Temp 97.9°F | Resp 18 | Ht 61.0 in | Wt 96.1 lb

## 2022-12-27 DIAGNOSIS — Z0000 Encounter for general adult medical examination without abnormal findings: Secondary | ICD-10-CM | POA: Diagnosis not present

## 2022-12-27 DIAGNOSIS — D509 Iron deficiency anemia, unspecified: Secondary | ICD-10-CM

## 2022-12-27 DIAGNOSIS — I10 Essential (primary) hypertension: Secondary | ICD-10-CM

## 2022-12-27 DIAGNOSIS — E559 Vitamin D deficiency, unspecified: Secondary | ICD-10-CM | POA: Diagnosis not present

## 2022-12-27 DIAGNOSIS — E441 Mild protein-calorie malnutrition: Secondary | ICD-10-CM

## 2022-12-27 DIAGNOSIS — R634 Abnormal weight loss: Secondary | ICD-10-CM

## 2022-12-27 LAB — CBC WITH DIFFERENTIAL/PLATELET
Basophils Absolute: 0.1 10*3/uL (ref 0.0–0.1)
Basophils Relative: 1 % (ref 0.0–3.0)
Eosinophils Absolute: 0.5 10*3/uL (ref 0.0–0.7)
Eosinophils Relative: 7.2 % — ABNORMAL HIGH (ref 0.0–5.0)
HCT: 42 % (ref 36.0–46.0)
Hemoglobin: 13.8 g/dL (ref 12.0–15.0)
Lymphocytes Relative: 22.4 % (ref 12.0–46.0)
Lymphs Abs: 1.4 10*3/uL (ref 0.7–4.0)
MCHC: 33 g/dL (ref 30.0–36.0)
MCV: 95.4 fL (ref 78.0–100.0)
Monocytes Absolute: 0.5 10*3/uL (ref 0.1–1.0)
Monocytes Relative: 8.5 % (ref 3.0–12.0)
Neutro Abs: 3.8 10*3/uL (ref 1.4–7.7)
Neutrophils Relative %: 60.9 % (ref 43.0–77.0)
Platelets: 145 10*3/uL — ABNORMAL LOW (ref 150.0–400.0)
RBC: 4.4 Mil/uL (ref 3.87–5.11)
RDW: 15 % (ref 11.5–15.5)
WBC: 6.3 10*3/uL (ref 4.0–10.5)

## 2022-12-27 LAB — VITAMIN B12: Vitamin B-12: 459 pg/mL (ref 211–911)

## 2022-12-27 LAB — COMPREHENSIVE METABOLIC PANEL
ALT: 12 U/L (ref 0–35)
AST: 17 U/L (ref 0–37)
Albumin: 3.8 g/dL (ref 3.5–5.2)
Alkaline Phosphatase: 90 U/L (ref 39–117)
BUN: 16 mg/dL (ref 6–23)
CO2: 26 meq/L (ref 19–32)
Calcium: 9 mg/dL (ref 8.4–10.5)
Chloride: 102 meq/L (ref 96–112)
Creatinine, Ser: 0.76 mg/dL (ref 0.40–1.20)
GFR: 69.31 mL/min (ref >60.00)
Glucose, Bld: 93 mg/dL (ref 70–99)
Potassium: 4.3 meq/L (ref 3.5–5.1)
Sodium: 137 meq/L (ref 135–145)
Total Bilirubin: 0.5 mg/dL (ref 0.2–1.2)
Total Protein: 6.8 g/dL (ref 6.0–8.3)

## 2022-12-27 LAB — VITAMIN D 25 HYDROXY (VIT D DEFICIENCY, FRACTURES): VITD: 70.67 ng/mL (ref 30.00–100.00)

## 2022-12-27 NOTE — Patient Instructions (Signed)
It was a pleasure meeting you today. Thank you for allowing me to take part in your health care.  Our goals for today as we discussed include:  We will get some labs today.  If they are abnormal or we need to do something about them, I will call you.  If they are normal, I will send you a message on MyChart (if it is active) or a letter in the mail.  If you don't hear from [place] in 2 weeks, please call the office at the number below.    This is a list of the screening recommended for you and due dates:  Health Maintenance  Topic Date Due   COVID-19 Vaccine (7 - 2024-25 season) 01/18/2023   Medicare Annual Wellness Visit  05/17/2023   DTaP/Tdap/Td vaccine (2 - Td or Tdap) 05/03/2030   Pneumonia Vaccine  Completed   Flu Shot  Completed   Zoster (Shingles) Vaccine  Completed   DEXA scan (bone density measurement)  Addressed   HPV Vaccine  Aged Out    Follow up in 6 months  If you have any questions or concerns, please do not hesitate to call the office at [phone].  I look forward to our next visit and until then take care and stay safe.  Regards,   Dana Allan, MD   [facility]
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
## 2023-01-02 ENCOUNTER — Encounter: Payer: Self-pay | Admitting: Family Medicine

## 2023-01-02 DIAGNOSIS — E559 Vitamin D deficiency, unspecified: Secondary | ICD-10-CM

## 2023-01-02 DIAGNOSIS — Z0000 Encounter for general adult medical examination without abnormal findings: Secondary | ICD-10-CM | POA: Insufficient documentation

## 2023-01-02 DIAGNOSIS — E441 Mild protein-calorie malnutrition: Secondary | ICD-10-CM | POA: Insufficient documentation

## 2023-01-02 HISTORY — DX: Encounter for general adult medical examination without abnormal findings: Z00.00

## 2023-01-02 HISTORY — DX: Vitamin D deficiency, unspecified: E55.9

## 2023-01-02 NOTE — Assessment & Plan Note (Signed)
Well controlled on Losartan 50mg  half a tablet twice daily. No recent home blood pressure readings but office reading within normal limits. -Continue Losartan as prescribed.

## 2023-01-02 NOTE — Assessment & Plan Note (Signed)
Vaccines up to date No indication for colon or cervical cancer screening.  Age out. Mammogram up to date Continue regular self breast exams Encouraged increasing high caloric meals. Continue to remain active. Walks daily

## 2023-01-02 NOTE — Assessment & Plan Note (Addendum)
Patient reports weight loss despite eating well and trying to maintain caloric intake. Reports feeling full quickly and changes in taste. Currently supplementing with half a bottle of Boost daily due to gastrointestinal intolerance. Has gained few pounds since last visit. -Order labs to check white count, kidney function, and albumin to assess nutritional status. -Explore high calorie supplement options to increase caloric intake.

## 2023-01-21 ENCOUNTER — Other Ambulatory Visit: Payer: Self-pay | Admitting: Pulmonary Disease

## 2023-01-21 ENCOUNTER — Ambulatory Visit
Admission: RE | Admit: 2023-01-21 | Discharge: 2023-01-21 | Disposition: A | Discharge status: Home / Self Care | Payer: Medicare Other | Source: Ambulatory Visit | Attending: Pulmonary Disease | Admitting: Pulmonary Disease

## 2023-01-21 DIAGNOSIS — I700 Atherosclerosis of aorta: Secondary | ICD-10-CM | POA: Diagnosis not present

## 2023-01-21 DIAGNOSIS — J8410 Pulmonary fibrosis, unspecified: Secondary | ICD-10-CM | POA: Diagnosis not present

## 2023-01-21 DIAGNOSIS — J849 Interstitial pulmonary disease, unspecified: Secondary | ICD-10-CM

## 2023-01-21 DIAGNOSIS — R918 Other nonspecific abnormal finding of lung field: Secondary | ICD-10-CM | POA: Diagnosis not present

## 2023-01-28 ENCOUNTER — Encounter: Payer: Self-pay | Admitting: Family Medicine

## 2023-01-28 ENCOUNTER — Ambulatory Visit: Payer: Self-pay | Admitting: Family Medicine

## 2023-01-28 NOTE — Telephone Encounter (Signed)
Called pt and she stated that she has been taking a whole tablet 2 times a day.

## 2023-01-28 NOTE — Telephone Encounter (Signed)
  Chief Complaint: Hypertension Symptoms: systolic BP readings in 190s a week ago at pulmonary office. Had BP readings in the 160s-180s since with some SBP in the 140s Frequency: over a week Pertinent Negatives: Patient denies CP, headache, newer SOB Disposition: [] ED /[] Urgent Care (no appt availability in office) / [x] Appointment(In office/virtual)/ []  [facility]/ [] Home Care/ [] Refused Recommended Disposition /[] [facility]/ []  Follow-up with PCP Additional Notes: patient states that she was at her pulmonary office a week ago and had a SBP reading of 198. Patient has been taking a 1/2 tablet of losartan twice a day. Patient has instructions from her doctor if BP is above 140 to take the other half. Patient states that she has been take a whole tablet twice a day and still having higher readings. Patient is calling for an appointment. Current BP this morning is 147/80. Per protocol, appointment made with PCP for 01/29/2023 at 1:40 pm. Patient verbalizes understanding of plan and all questions answered.    Copied from CRM [id number]. Topic: Clinical - Red Word Triage >> Jan 28, 2023 10:06 AM Adele Barthel wrote: Red Word that prompted transfer to Nurse Triage: Patient has had blood pressure issues for over a week. Her pulmonologist got a reading of 198 systolic pressure a week ago. She was taking a half tablet of losartan 2 times a day but has now been taking full tablet morning and evening which was advised by provider. Not have having chest pain and swelling. Typically having breathing difficulties, so does not believe it has increased. Walked 2 miles today and her blood pressure was 146 systolic this morning. She was advised by provider to schedule appt. Reason for Disposition  Systolic BP  >= 160 OR Diastolic >= 100  Answer Assessment - Initial Assessment Questions 1. BLOOD PRESSURE: "What is the blood pressure?" "Did you take at least two measurements 5 minutes apart?"      147/80-been dealing with high BP for the last couple of weeks 2. ONSET: "When did you take your blood pressure?"     In the morning 3. HOW: "How did you take your blood pressure?" (e.g., automatic home BP monitor, visiting nurse)     Automatic home BP  4. HISTORY: "Do you have a history of high blood pressure?"     Yes 5. MEDICINES: "Are you taking any medicines for blood pressure?" "Have you missed any doses recently?"     No changes  6. OTHER SYMPTOMS: "Do you have any symptoms?" (e.g., blurred vision, chest pain, difficulty breathing, headache, weakness)     No new symptoms  Protocols used: Blood Pressure - High-A-AH

## 2023-01-29 ENCOUNTER — Encounter: Payer: Self-pay | Admitting: Family Medicine

## 2023-01-29 ENCOUNTER — Ambulatory Visit (INDEPENDENT_AMBULATORY_CARE_PROVIDER_SITE_OTHER): Payer: Medicare Other | Admitting: Family Medicine

## 2023-01-29 VITALS — BP 140/78 | HR 65 | Temp 97.5°F | Resp 16 | Ht 61.0 in | Wt 98.2 lb

## 2023-01-29 DIAGNOSIS — I10 Essential (primary) hypertension: Secondary | ICD-10-CM

## 2023-01-29 DIAGNOSIS — J8410 Pulmonary fibrosis, unspecified: Secondary | ICD-10-CM

## 2023-01-29 MED ORDER — HYDROCHLOROTHIAZIDE 12.5 MG PO TABS
6.2500 mg | ORAL_TABLET | Freq: Every day | ORAL | 0 refills | Status: DC
Start: 2023-01-29 — End: 2023-02-15

## 2023-01-29 NOTE — Patient Instructions (Addendum)
It was a pleasure meeting you today. Thank you for allowing me to take part in your health care.  Our goals for today as we discussed include:  Start Hydrochlorothiazide 6.25 mg in the morning. This is half of the pill that you will get from the pharmacy  Schedule an appointment in 1-2 weeks to recheck your kidney functions   Continue Losartan 25 mg two times a day  Monitor your blood pressure at home.  Goal is <150/90.  Send in blood pressure readings by MyChart in the next week to monitor.  This is a list of the screening recommended for you and due dates:  Health Maintenance  Topic Date Due   COVID-19 Vaccine (7 - 2024-25 season) 01/18/2023   Medicare Annual Wellness Visit  05/17/2023   DTaP/Tdap/Td vaccine (2 - Td or Tdap) 05/03/2030   Pneumonia Vaccine  Completed   Flu Shot  Completed   Zoster (Shingles) Vaccine  Completed   DEXA scan (bone density measurement)  Addressed   HPV Vaccine  Aged Out     Follow up as needed   If you have any questions or concerns, please do not hesitate to call the office at [phone].  I look forward to our next visit and until then take care and stay safe.  Regards,   Dana Allan, MD   [facility]

## 2023-01-29 NOTE — Progress Notes (Signed)
SUBJECTIVE:   Chief Complaint  Patient presents with   Hypertension   HPI Presents for acute visit.  Discussed the use of AI scribe software for clinical note transcription with the patient, who gave verbal consent to proceed.  History of Present Illness The patient, with a history of hypertension, presented with concerns about persistently elevated blood pressure readings. Approximately a week prior to this visit, the patient had a consultation with a pulmonologist, during which their blood pressure was recorded at 198 mmHg. Despite the high reading, the patient reported no associated symptoms such as chest pain, swelling, or shortness of breath. Following the pulmonologist's advice, the patient increased their dose of Losartan to two tablets daily, one in the morning and one in the afternoon. However, the patient reported that their blood pressure readings remained inconsistently high, with occasional reductions. The patient denied experiencing any headaches or increased shortness of breath. They also reported maintaining their usual activity level, including walking two miles daily, and denied any pain. The patient has not started any new medications and continues to take their usual dose of ibuprofen.  In addition to hypertension, the patient has a history of a hiatal hernia, which was surgically corrected 18 months prior. The patient reported no current symptoms related to the hiatal hernia. They also underwent a CT scan of the chest a week prior to this visit, the results of which were not discussed during this consultation. The patient is scheduled for a follow-up visit with the pulmonologist to discuss the results of the CT scan and a breathing test.   PERTINENT PMH / PSH: As above  OBJECTIVE:  BP (!) 140/78   Pulse 65   Temp (!) 97.5 F (36.4 C)   Resp 16   Ht 5\' 1"  (1.549 m)   Wt 98 lb 4 oz (44.6 kg)   LMP  (LMP Unknown)   BMI 18.56 kg/m    Physical Exam Vitals reviewed.   Constitutional:      General: She is not in acute distress.    Appearance: Normal appearance. She is normal weight. She is not ill-appearing, toxic-appearing or diaphoretic.  Eyes:     General:        Right eye: No discharge.        Left eye: No discharge.     Conjunctiva/sclera: Conjunctivae normal.  Cardiovascular:     Rate and Rhythm: Normal rate.  Pulmonary:     Effort: Pulmonary effort is normal.  Musculoskeletal:        General: Normal range of motion.  Skin:    General: Skin is warm and dry.  Neurological:     General: No focal deficit present.     Mental Status: She is alert and oriented to person, place, and time. Mental status is at baseline.  Psychiatric:        Mood and Affect: Mood normal.        Behavior: Behavior normal.        Thought Content: Thought content normal.        Judgment: Judgment normal.           12/27/2022    9:19 AM 08/27/2022   11:17 AM 05/15/2022    8:38 AM 04/30/2022    4:02 PM 04/11/2022   10:09 AM  Depression screen PHQ 2/9  Decreased Interest 0 0 0 0 0  Down, Depressed, Hopeless 0 0 0 0 0  PHQ - 2 Score 0 0 0 0 0  Altered sleeping  0 0 0 0   Tired, decreased energy 0 0 0 0   Change in appetite 0 0 0 0   Feeling bad or failure about yourself  0 0 0 0   Trouble concentrating 0 0 0 0   Moving slowly or fidgety/restless 0 0 0 0   Suicidal thoughts 0 0 0 0   PHQ-9 Score 0 0 0 0   Difficult doing work/chores Not difficult at all Not difficult at all Not difficult at all Not difficult at all       12/27/2022    9:19 AM 08/27/2022   11:17 AM 05/15/2022    8:38 AM 04/30/2022    4:02 PM  GAD 7 : Generalized Anxiety Score  Nervous, Anxious, on Edge 0 0 0 0  Control/stop worrying 0 0 0 0  Worry too much - different things 0 0 0 0  Trouble relaxing 0 0 0 0  Restless 0 0 0 0  Easily annoyed or irritable 0 0 0 0  Afraid - awful might happen 0 0 0 0  Total GAD 7 Score 0 0 0 0  Anxiety Difficulty Not difficult at all Not difficult at all  Not difficult at all Not difficult at all    ASSESSMENT/PLAN:  Essential hypertension Assessment & Plan: Blood pressure elevated at recent pulmonology visit (198 systolic). Patient has been taking an extra dose of Losartan (25mg ) twice daily since then. Today's reading is 140/88. No symptoms of headache or shortness of breath reported. Could not tolerate low dose Amlodipine previously due to lower extremity swelling. -Continue Losartan 25mg  twice daily. -Add hydrochlorothiazide 6.25 mg daily -Check kidney function due to recent increase in Losartan.  Orders: -     Comprehensive metabolic panel -     hydroCHLOROthiazide; Take 0.5 tablets (6.25 mg total) by mouth daily.  Dispense: 30 tablet; Refill: 0 -     Comprehensive metabolic panel; Future -     Basic metabolic panel; Future  Pulmonary fibrosis (HCC) Assessment & Plan: Recent CT scan showed large pulmonary trunk, suggesting high pressure in the lungs. Patient has follow-up appointment with pulmonologist to discuss findings and potential treatment. -Continue current management under pulmonologist's care.     PDMP reviewed  Return if symptoms worsen or fail to improve, for PCP.  Dana Allan, MD

## 2023-01-30 ENCOUNTER — Encounter: Payer: Self-pay | Admitting: Family Medicine

## 2023-01-30 LAB — COMPREHENSIVE METABOLIC PANEL
ALT: 12 U/L (ref 0–35)
AST: 19 U/L (ref 0–37)
Albumin: 4.1 g/dL (ref 3.5–5.2)
Alkaline Phosphatase: 76 U/L (ref 39–117)
BUN: 14 mg/dL (ref 6–23)
CO2: 28 meq/L (ref 19–32)
Calcium: 9.4 mg/dL (ref 8.4–10.5)
Chloride: 102 meq/L (ref 96–112)
Creatinine, Ser: 0.7 mg/dL (ref 0.40–1.20)
GFR: 76.44 mL/min (ref >60.00)
Glucose, Bld: 88 mg/dL (ref 70–99)
Potassium: 3.7 meq/L (ref 3.5–5.1)
Sodium: 138 meq/L (ref 135–145)
Total Bilirubin: 0.5 mg/dL (ref 0.2–1.2)
Total Protein: 7.5 g/dL (ref 6.0–8.3)

## 2023-01-31 ENCOUNTER — Ambulatory Visit: Payer: Medicare Other | Admitting: Dermatology

## 2023-02-01 DIAGNOSIS — J8410 Pulmonary fibrosis, unspecified: Secondary | ICD-10-CM | POA: Diagnosis not present

## 2023-02-04 DIAGNOSIS — D2262 Melanocytic nevi of left upper limb, including shoulder: Secondary | ICD-10-CM | POA: Diagnosis not present

## 2023-02-04 DIAGNOSIS — L821 Other seborrheic keratosis: Secondary | ICD-10-CM | POA: Diagnosis not present

## 2023-02-04 DIAGNOSIS — D2271 Melanocytic nevi of right lower limb, including hip: Secondary | ICD-10-CM | POA: Diagnosis not present

## 2023-02-04 DIAGNOSIS — D2272 Melanocytic nevi of left lower limb, including hip: Secondary | ICD-10-CM | POA: Diagnosis not present

## 2023-02-04 DIAGNOSIS — D0461 Carcinoma in situ of skin of right upper limb, including shoulder: Secondary | ICD-10-CM | POA: Diagnosis not present

## 2023-02-04 DIAGNOSIS — D2261 Melanocytic nevi of right upper limb, including shoulder: Secondary | ICD-10-CM | POA: Diagnosis not present

## 2023-02-04 DIAGNOSIS — D485 Neoplasm of uncertain behavior of skin: Secondary | ICD-10-CM | POA: Diagnosis not present

## 2023-02-04 DIAGNOSIS — L570 Actinic keratosis: Secondary | ICD-10-CM | POA: Diagnosis not present

## 2023-02-04 DIAGNOSIS — D225 Melanocytic nevi of trunk: Secondary | ICD-10-CM | POA: Diagnosis not present

## 2023-02-06 DIAGNOSIS — J84112 Idiopathic pulmonary fibrosis: Secondary | ICD-10-CM | POA: Diagnosis not present

## 2023-02-07 ENCOUNTER — Ambulatory Visit: Payer: Medicare Other | Admitting: Podiatry

## 2023-02-07 ENCOUNTER — Encounter: Payer: Self-pay | Admitting: Podiatry

## 2023-02-07 VITALS — Ht 61.0 in | Wt 98.2 lb

## 2023-02-07 DIAGNOSIS — M79674 Pain in right toe(s): Secondary | ICD-10-CM

## 2023-02-07 DIAGNOSIS — B351 Tinea unguium: Secondary | ICD-10-CM | POA: Diagnosis not present

## 2023-02-07 DIAGNOSIS — M79675 Pain in left toe(s): Secondary | ICD-10-CM

## 2023-02-07 NOTE — Progress Notes (Signed)
  Subjective:  Patient ID: Wanda Nelson, female    DOB: [birth year],  MRN: [id number]  89 y.o. female presents painful elongated mycotic toenails 1-5 bilaterally which are tender when wearing enclosed shoe gear. Pain is relieved with periodic professional debridement. Chief Complaint  Patient presents with   Nail Problem    Pt is here for RFC not a diabetic PCP is Dr Clent Ridges and LOV was in December.   New problem(s): None   PCP is Dana Allan, MD.  Allergies  Allergen Reactions   Bee Venom Anaphylaxis and Hives   Codeine Other (See Comments)    Altered mental status Altered mental status   Morphine And Codeine     Altered mental status   Shellfish Allergy Anaphylaxis and Hives    Mainly shrimp   Bactrim [Sulfamethoxazole-Trimethoprim]     ? Reaction     Norvasc [Amlodipine]     2.5 leg edema    Nitrofurantoin Rash    Review of Systems: Negative except as noted in the HPI.   Objective:  Wanda Nelson is a pleasant 89 y.o. female thin build in NAD. AAO x 3.  Vascular Examination: Vascular status intact b/l with palpable pedal pulses. CFT immediate b/l. Pedal hair present. No edema. No pain with calf compression b/l. Skin temperature gradient WNL b/l. No varicosities noted. No cyanosis or clubbing noted.  Neurological Examination: Sensation grossly intact b/l with 10 gram monofilament. Vibratory sensation intact b/l.  Dermatological Examination: Pedal skin with normal turgor, texture and tone b/l. No open wounds nor interdigital macerations noted. Toenails 1-5 b/l thick, discolored, elongated with subungual debris and pain on dorsal palpation. No hyperkeratotic lesions noted b/l.   Musculoskeletal Examination: Muscle strength 5/5 to b/l LE.  No pain, crepitus noted b/l. No gross pedal deformities. Patient ambulates independently without assistive aids.   Radiographs: None  Last A1c:       No data to display           Assessment:   1. Pain due to  onychomycosis of toenails of both feet    Plan:  Patient was evaluated and treated. All patient's and/or POA's questions/concerns addressed on today's visit. Toenails 1-5 debrided in length and girth without incident. Continue soft, supportive shoe gear daily. Report any pedal injuries to medical professional. Call office if there are any questions/concerns. -Patient/POA to call should there be question/concern in the interim.  Return in about 3 months (around 05/08/2023).  Freddie Breech, DPM      [facility] LOCATION: [address], [place] [address]                   Office [phone]   [facility] LOCATION: [address] [place], [place] [address] Office [phone]

## 2023-02-08 ENCOUNTER — Other Ambulatory Visit (INDEPENDENT_AMBULATORY_CARE_PROVIDER_SITE_OTHER): Payer: Medicare Other

## 2023-02-08 DIAGNOSIS — I10 Essential (primary) hypertension: Secondary | ICD-10-CM

## 2023-02-08 LAB — COMPREHENSIVE METABOLIC PANEL
ALT: 15 U/L (ref 0–35)
AST: 21 U/L (ref 0–37)
Albumin: 3.9 g/dL (ref 3.5–5.2)
Alkaline Phosphatase: 92 U/L (ref 39–117)
BUN: 18 mg/dL (ref 6–23)
CO2: 30 meq/L (ref 19–32)
Calcium: 9.4 mg/dL (ref 8.4–10.5)
Chloride: 95 meq/L — ABNORMAL LOW (ref 96–112)
Creatinine, Ser: 0.8 mg/dL (ref 0.40–1.20)
GFR: 65.11 mL/min (ref >60.00)
Glucose, Bld: 96 mg/dL (ref 70–99)
Potassium: 3.7 meq/L (ref 3.5–5.1)
Sodium: 134 meq/L — ABNORMAL LOW (ref 135–145)
Total Bilirubin: 0.6 mg/dL (ref 0.2–1.2)
Total Protein: 7.6 g/dL (ref 6.0–8.3)

## 2023-02-10 NOTE — Assessment & Plan Note (Signed)
Blood pressure elevated at recent pulmonology visit (198 systolic). Patient has been taking an extra dose of Losartan (25mg ) twice daily since then. Today's reading is 140/88. No symptoms of headache or shortness of breath reported. Could not tolerate low dose Amlodipine previously due to lower extremity swelling. -Continue Losartan 25mg  twice daily. -Add hydrochlorothiazide 6.25 mg daily -Check kidney function due to recent increase in Losartan.

## 2023-02-10 NOTE — Assessment & Plan Note (Signed)
Recent CT scan showed large pulmonary trunk, suggesting high pressure in the lungs. Patient has follow-up appointment with pulmonologist to discuss findings and potential treatment. -Continue current management under pulmonologist's care.

## 2023-02-11 DIAGNOSIS — D0461 Carcinoma in situ of skin of right upper limb, including shoulder: Secondary | ICD-10-CM | POA: Diagnosis not present

## 2023-02-15 ENCOUNTER — Encounter: Payer: Self-pay | Admitting: Family Medicine

## 2023-02-15 ENCOUNTER — Ambulatory Visit (INDEPENDENT_AMBULATORY_CARE_PROVIDER_SITE_OTHER): Payer: Medicare Other | Admitting: Family Medicine

## 2023-02-15 VITALS — BP 136/64 | HR 51 | Temp 97.9°F | Resp 18 | Ht 61.0 in | Wt 96.1 lb

## 2023-02-15 DIAGNOSIS — I10 Essential (primary) hypertension: Secondary | ICD-10-CM

## 2023-02-15 LAB — BASIC METABOLIC PANEL
BUN: 17 mg/dL (ref 6–23)
CO2: 26 meq/L (ref 19–32)
Calcium: 9.2 mg/dL (ref 8.4–10.5)
Chloride: 101 meq/L (ref 96–112)
Creatinine, Ser: 0.79 mg/dL (ref 0.40–1.20)
GFR: 66.1 mL/min (ref >60.00)
Glucose, Bld: 103 mg/dL — ABNORMAL HIGH (ref 70–99)
Potassium: 4.4 meq/L (ref 3.5–5.1)
Sodium: 136 meq/L (ref 135–145)

## 2023-02-15 MED ORDER — HYDROCHLOROTHIAZIDE 12.5 MG PO TABS: 6.2500 mg | ORAL_TABLET | Freq: Every day | ORAL | Status: DC | PRN

## 2023-02-15 NOTE — Progress Notes (Signed)
 SUBJECTIVE:   Chief Complaint  Patient presents with   Hypertension   HPI Presents for follow up high blood pressure   Discussed the use of AI scribe software for clinical note transcription with the patient, who gave verbal consent to proceed.  History of Present Illness Wanda Nelson is an 89 year old female with hypertension who presents for blood pressure management.  She was previously started on hydrochlorothiazide  due to elevated blood pressure readings, but her sodium levels decreased slightly, leading to the discontinuation of the diuretic on February 12, 2023. Since stopping the diuretic, her blood pressure readings have remained stable, typically in the range of 120s to 130s systolic, with a recent reading of 136/64 mmHg.  Her current medication regimen includes losartan , which she takes as a full tablet. No adverse effects from the diuretic, and she denies excessive urination. She monitors her blood pressure regularly and notes that it tends to decrease after resting post-activity. No chest pain or shortness of breath.  In terms of her social history, she does not add salt to her food and prefers snacks like chocolate over salty foods. She is active in her daily life, walking two miles daily and caring for her blind daughter-in-law.      PERTINENT PMH / PSH: As above  OBJECTIVE:  BP 136/64   Pulse (!) 51   Temp 97.9 F (36.6 C)   Resp 18   Ht 5' 1 (1.549 m)   Wt 96 lb 2 oz (43.6 kg)   LMP  (LMP Unknown)   SpO2 97%   BMI 18.16 kg/m    Physical Exam Vitals reviewed.  Constitutional:      General: She is not in acute distress.    Appearance: Normal appearance. She is normal weight. She is not ill-appearing, toxic-appearing or diaphoretic.  Eyes:     General:        Right eye: No discharge.        Left eye: No discharge.     Conjunctiva/sclera: Conjunctivae normal.  Cardiovascular:     Rate and Rhythm: Normal rate and regular rhythm.     Heart  sounds: Normal heart sounds.  Pulmonary:     Effort: Pulmonary effort is normal.     Breath sounds: Normal breath sounds.  Abdominal:     General: Bowel sounds are normal.  Musculoskeletal:        General: Normal range of motion.  Skin:    General: Skin is warm and dry.  Neurological:     General: No focal deficit present.     Mental Status: She is alert and oriented to person, place, and time. Mental status is at baseline.  Psychiatric:        Mood and Affect: Mood normal.        Behavior: Behavior normal.        Thought Content: Thought content normal.        Judgment: Judgment normal.           02/15/2023    7:50 AM 12/27/2022    9:19 AM 08/27/2022   11:17 AM 05/15/2022    8:38 AM 04/30/2022    4:02 PM  Depression screen PHQ 2/9  Decreased Interest 0 0 0 0 0  Down, Depressed, Hopeless 0 0 0 0 0  PHQ - 2 Score 0 0 0 0 0  Altered sleeping 0 0 0 0 0  Tired, decreased energy 0 0 0 0 0  Change in appetite 0  0 0 0 0  Feeling bad or failure about yourself  0 0 0 0 0  Trouble concentrating 0 0 0 0 0  Moving slowly or fidgety/restless 0 0 0 0 0  Suicidal thoughts 0 0 0 0 0  PHQ-9 Score 0 0 0 0 0  Difficult doing work/chores Not difficult at all Not difficult at all Not difficult at all Not difficult at all Not difficult at all      02/15/2023    7:50 AM 12/27/2022    9:19 AM 08/27/2022   11:17 AM 05/15/2022    8:38 AM  GAD 7 : Generalized Anxiety Score  Nervous, Anxious, on Edge 0 0 0 0  Control/stop worrying 0 0 0 0  Worry too much - different things 0 0 0 0  Trouble relaxing 0 0 0 0  Restless 0 0 0 0  Easily annoyed or irritable 0 0 0 0  Afraid - awful might happen 0 0 0 0  Total GAD 7 Score 0 0 0 0  Anxiety Difficulty Not difficult at all Not difficult at all Not difficult at all Not difficult at all    ASSESSMENT/PLAN:  Essential hypertension Assessment & Plan: Blood pressure controlled on current regimen of Losartan  and Hydrochlorothiazide . Recent mild  hyponatremia likely secondary to Hydrochlorothiazide . No symptoms of volume depletion or hyponatremia. -Continue Losartan . -Hold Hydrochlorothiazide  unless blood pressure exceeds 170/100. -Check blood work today to monitor sodium and renal function. -Check blood pressure regularly and report if consistently elevated.  Orders: -     Basic metabolic panel -     hydroCHLOROthiazide ; Take 0.5 tablets (6.25 mg total) by mouth daily as needed. If BP >170/100   PDMP reviewed  Return in about 4 months (around 06/27/2023) for PCP.  Glenys Ferrari, MD

## 2023-02-15 NOTE — Assessment & Plan Note (Signed)
 Blood pressure controlled on current regimen of Losartan  and Hydrochlorothiazide . Recent mild hyponatremia likely secondary to Hydrochlorothiazide . No symptoms of volume depletion or hyponatremia. -Continue Losartan . -Hold Hydrochlorothiazide  unless blood pressure exceeds 170/100. -Check blood work today to monitor sodium and renal function. -Check blood pressure regularly and report if consistently elevated.

## 2023-02-15 NOTE — Patient Instructions (Addendum)
 It was a pleasure meeting you today. Thank you for allowing me to take part in your health care.  Our goals for today as we discussed include:  Blood pressure today is great today Continue the Losartan  25 mg daily  Continue to hold Hydrochlorothiazide . If blood pressure greater than 170/100 can take a dose of Hydrochlorothiazide  as needed  Blood work today to monitor sodium and kidneys.  Continue to remain active   Follow up as scheduled in June.  If any worsening blood pressure follow up sooner.  This is a list of the screening recommended for you and due dates:  Health Maintenance  Topic Date Due   COVID-19 Vaccine (7 - 2024-25 season) 01/18/2023   Medicare Annual Wellness Visit  05/17/2023   DTaP/Tdap/Td vaccine (2 - Td or Tdap) 05/03/2030   Pneumonia Vaccine  Completed   Flu Shot  Completed   Zoster (Shingles) Vaccine  Completed   DEXA scan (bone density measurement)  Addressed   HPV Vaccine  Aged Out      If you have any questions or concerns, please do not hesitate to call the office at [phone].  I look forward to our next visit and until then take care and stay safe.  Regards,   Glenys Ferrari, MD   [facility]

## 2023-03-05 DIAGNOSIS — H02883 Meibomian gland dysfunction of right eye, unspecified eyelid: Secondary | ICD-10-CM | POA: Diagnosis not present

## 2023-03-05 DIAGNOSIS — H401123 Primary open-angle glaucoma, left eye, severe stage: Secondary | ICD-10-CM | POA: Diagnosis not present

## 2023-03-05 DIAGNOSIS — H02886 Meibomian gland dysfunction of left eye, unspecified eyelid: Secondary | ICD-10-CM | POA: Diagnosis not present

## 2023-03-05 DIAGNOSIS — H401112 Primary open-angle glaucoma, right eye, moderate stage: Secondary | ICD-10-CM | POA: Diagnosis not present

## 2023-03-05 DIAGNOSIS — Z961 Presence of intraocular lens: Secondary | ICD-10-CM | POA: Diagnosis not present

## 2023-03-05 DIAGNOSIS — H0288A Meibomian gland dysfunction right eye, upper and lower eyelids: Secondary | ICD-10-CM | POA: Diagnosis not present

## 2023-03-05 DIAGNOSIS — H0288B Meibomian gland dysfunction left eye, upper and lower eyelids: Secondary | ICD-10-CM | POA: Diagnosis not present

## 2023-03-05 DIAGNOSIS — H16223 Keratoconjunctivitis sicca, not specified as Sjogren's, bilateral: Secondary | ICD-10-CM | POA: Diagnosis not present

## 2023-03-14 DIAGNOSIS — H90A32 Mixed conductive and sensorineural hearing loss, unilateral, left ear with restricted hearing on the contralateral side: Secondary | ICD-10-CM | POA: Diagnosis not present

## 2023-03-14 DIAGNOSIS — H6982 Other specified disorders of Eustachian tube, left ear: Secondary | ICD-10-CM | POA: Diagnosis not present

## 2023-03-14 DIAGNOSIS — H6123 Impacted cerumen, bilateral: Secondary | ICD-10-CM | POA: Diagnosis not present

## 2023-03-14 IMAGING — CT CT TEMPORAL BONES W/O CM
3 of 6 series · 15 of 40 positions shown, 18 images · non-contrast
Comparison: No pertinent prior exam.

CLINICAL DATA: Worsening hearing in the left ear

EXAM:
CT TEMPORAL BONES WITHOUT CONTRAST
TECHNIQUE: Axial and coronal plane CT imaging of the petrous temporal bones was
performed with thin-collimation image reconstruction. No intravenous
contrast was administered. Multiplanar CT image reconstructions were
also generated.

[Series 3: ax soft temperal bones 2.00 · axial · 0.33mm/px · z∈[-580,-556]mm · 2 of 37 slices shown]
[im 13/37  brain]
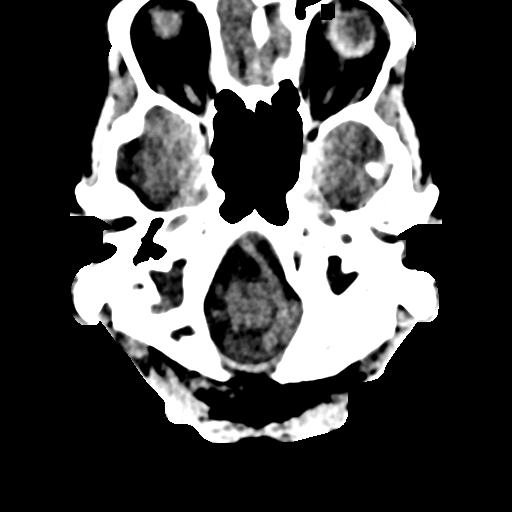
[im 25/37  brain]
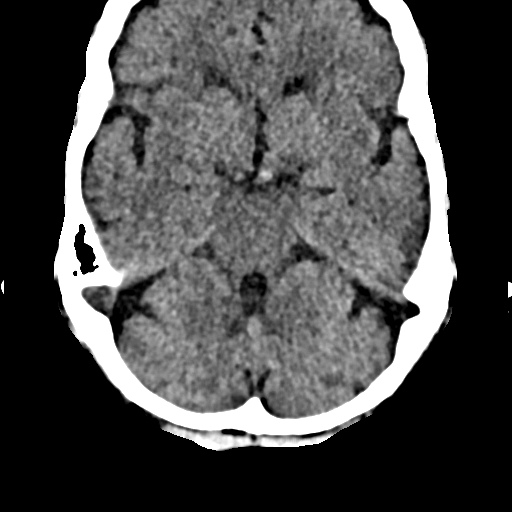

[Series 8: ax mag right temperal bones 0.60 · axial · 0.20mm/px · z∈[-598,-536]mm · 11 of 125 slices shown, 14 images]
[im 11/125  brain]
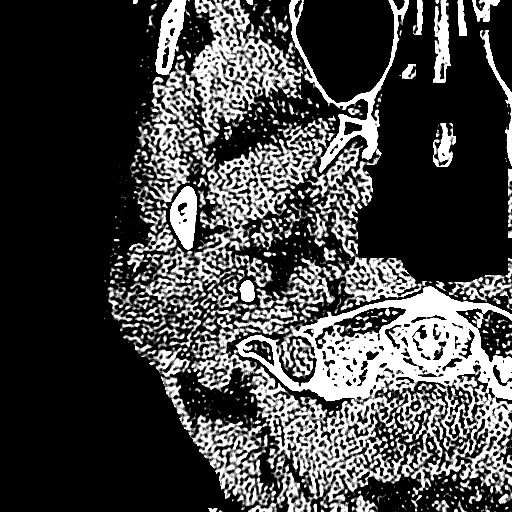
[im 11/125  bone]
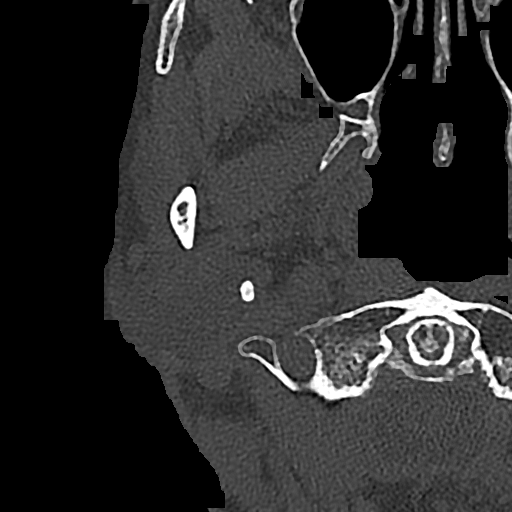
[im 21/125  bone]
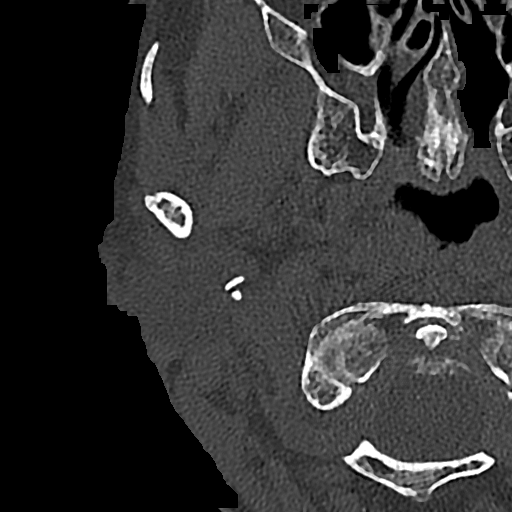
[im 32/125  bone]
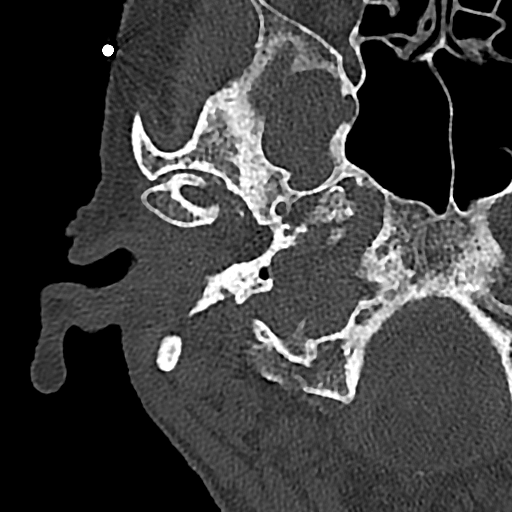
[im 42/125  bone]
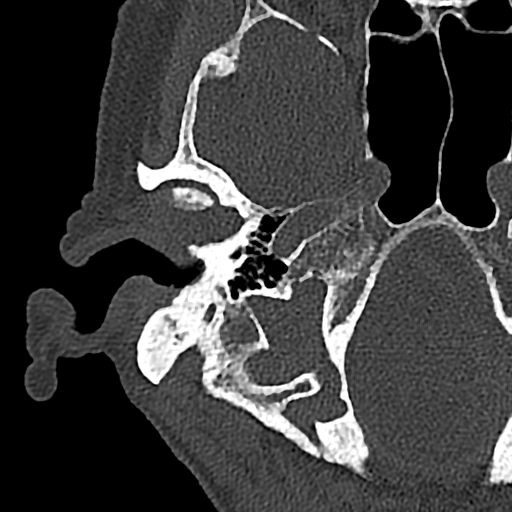
[im 52/125  brain]
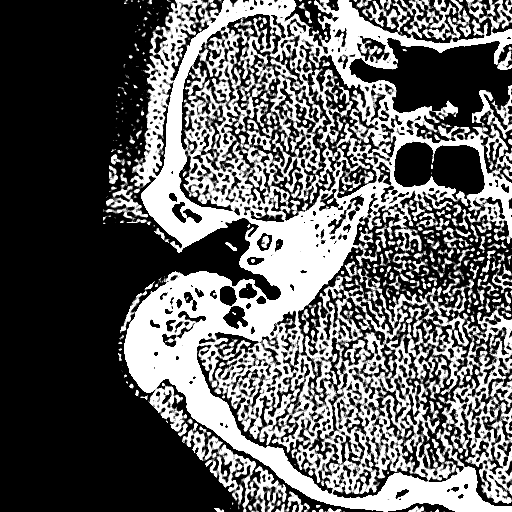
[im 52/125  bone]
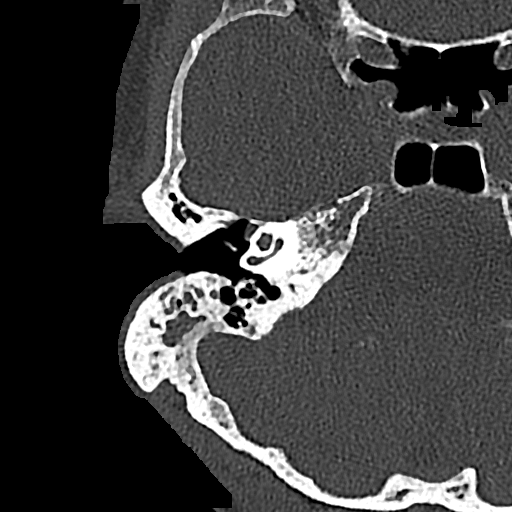
[im 63/125  bone]
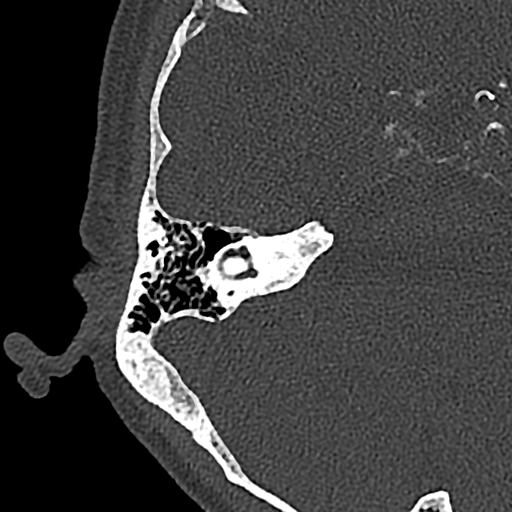
[im 73/125  bone]
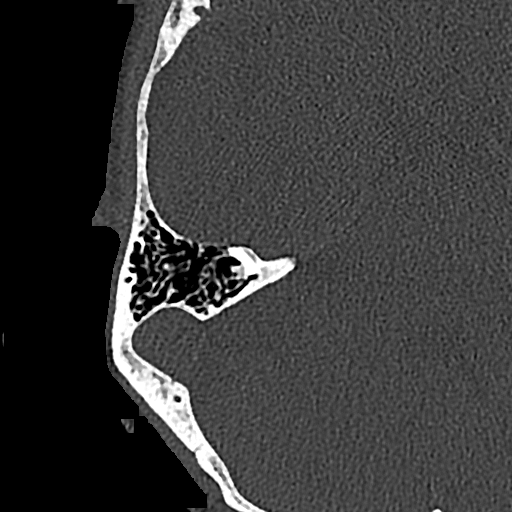
[im 83/125  bone]
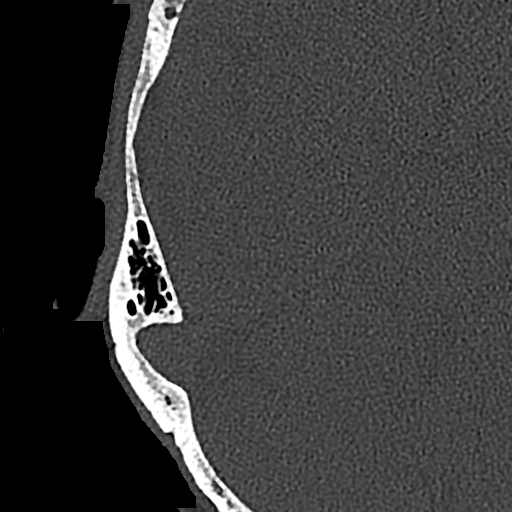
[im 94/125  brain]
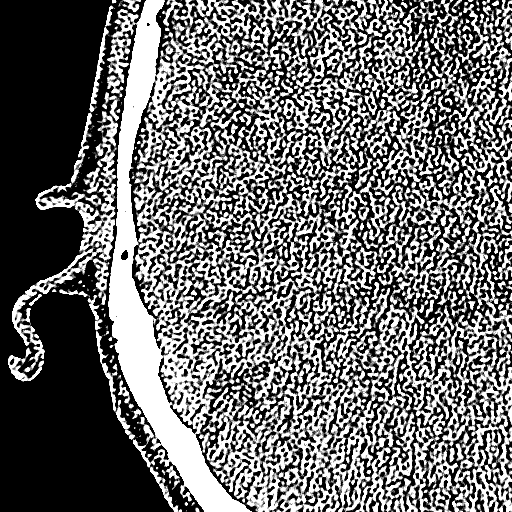
[im 94/125  bone]
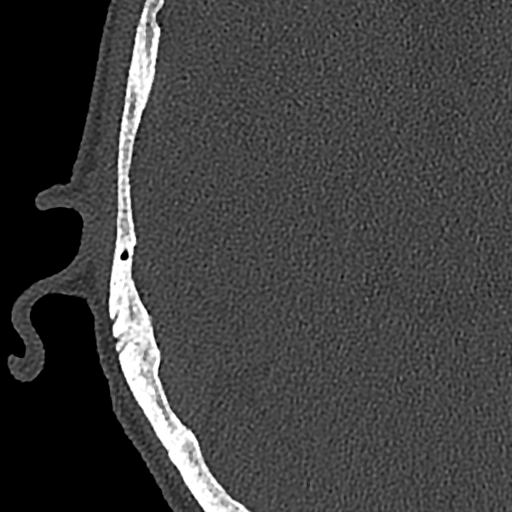
[im 104/125  bone]
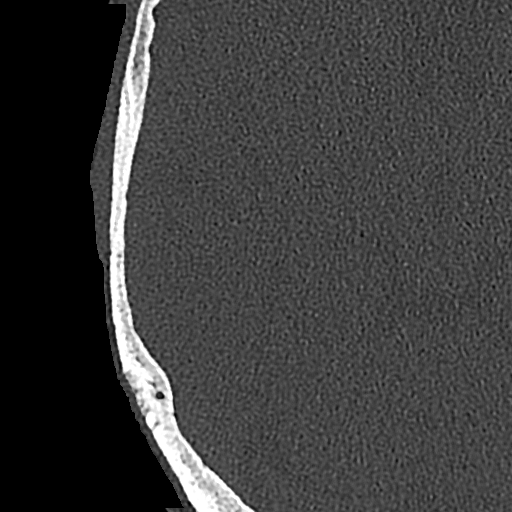
[im 114/125  bone]
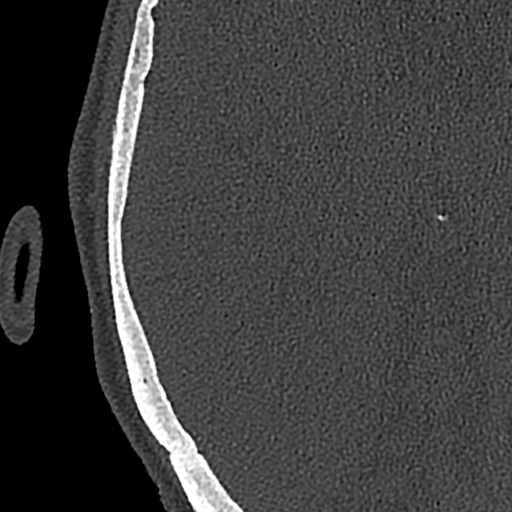

[Series 10: cor mag right temperal bones 0.80 cor · coronal · 0.15mm/px · 2 of 125 slices shown]
[im 42/125  bone]
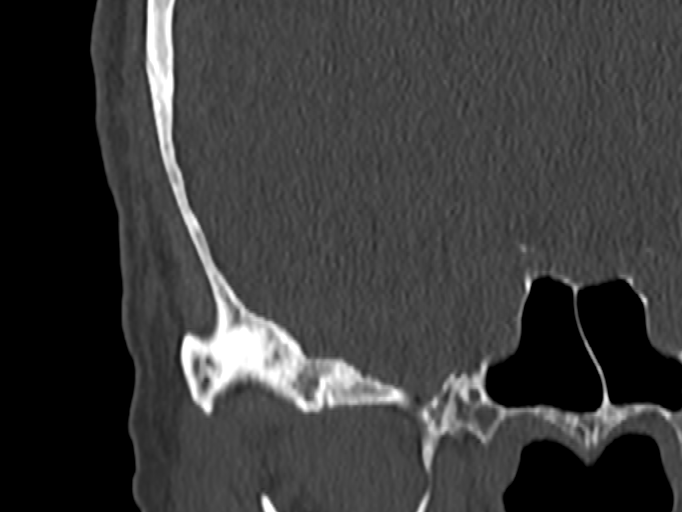
[im 83/125  bone]
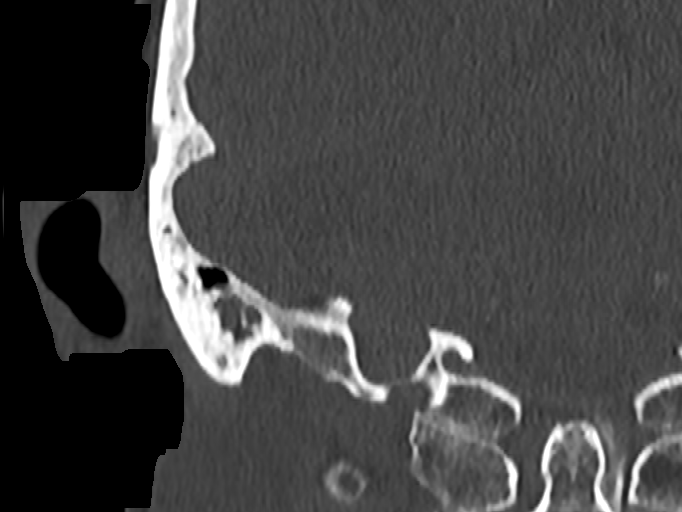

[15 of 40 positions shown; findings below may reference images not displayed]

FINDINGS: RIGHT TEMPORAL BONE

External auditory canal: Normal.

Middle ear cavity: Normally aerated. The scutum and ossicles are
normal. The tegmen tympani is intact.

Inner ear structures: The cochlea, vestibule and semicircular canals
are normal. The vestibular aqueduct is not enlarged.

Internal auditory and facial nerve canals:  Normal

Mastoid air cells: Partial opacification of mastoid air cells with
sclerosis. No bony erosion.

LEFT TEMPORAL BONE

External auditory canal: Normal.

Middle ear cavity: Diffuse opacification with retracted appearance
of tympanic membrane. The ossicular chain does appear intact.
Imperceptible tegmen mastoideum centrally. Anteriorly at
mesotympanum, the walls could be scalloped. Opacified Prussak space
without expansion or erosion

Inner ear structures: The cochlea, vestibule and semicircular canals
are normal. The vestibular aqueduct is not enlarged. Thin but likely
present arcuate eminence.

Internal auditory and facial nerve canals:  Normal.

Mastoid air cells: Diffuse opacification with patchy thinning at the
tegmen tympani.

Vascular: Unremarkable without contrast.

Limited intracranial:  No acute or significant finding.

Visible orbits/paranasal sinuses: Negative
IMPRESSION: 1. Extensive left mastoid and middle ear opacification with tegmen
thinning and middle ear scalloping suggesting cholesteatoma.
2. Mild right mastoid opacification without erosion.

## 2023-03-19 ENCOUNTER — Telehealth: Payer: Self-pay | Admitting: Family Medicine

## 2023-03-19 NOTE — Telephone Encounter (Signed)
 Copied from CRM [id number]. Topic: Medicare AWV >> Mar 19, 2023  1:14 PM Payton Doughty wrote: Reason for CRM: Called LVM 03/19/2023 to schedule AWV. Please schedule office or virtual visits.  Verlee Rossetti; Care Guide Ambulatory Clinical Support [place] [facility] Direct Dial: [phone]

## 2023-05-01 ENCOUNTER — Ambulatory Visit: Admitting: Family Medicine

## 2023-05-01 ENCOUNTER — Encounter: Payer: Self-pay | Admitting: Family Medicine

## 2023-05-01 VITALS — BP 130/70 | HR 62 | Temp 97.7°F | Resp 20 | Ht 61.0 in | Wt 91.0 lb

## 2023-05-01 DIAGNOSIS — R634 Abnormal weight loss: Secondary | ICD-10-CM

## 2023-05-01 DIAGNOSIS — R911 Solitary pulmonary nodule: Secondary | ICD-10-CM | POA: Diagnosis not present

## 2023-05-01 DIAGNOSIS — E559 Vitamin D deficiency, unspecified: Secondary | ICD-10-CM | POA: Diagnosis not present

## 2023-05-01 DIAGNOSIS — D509 Iron deficiency anemia, unspecified: Secondary | ICD-10-CM | POA: Diagnosis not present

## 2023-05-01 DIAGNOSIS — J8410 Pulmonary fibrosis, unspecified: Secondary | ICD-10-CM

## 2023-05-01 LAB — IBC + FERRITIN
Ferritin: 47.5 ng/mL (ref 10.0–291.0)
Iron: 42 ug/dL (ref 42–145)
Saturation Ratios: 13.7 % — ABNORMAL LOW (ref 20.0–50.0)
TIBC: 306.6 ug/dL (ref 250.0–450.0)
Transferrin: 219 mg/dL (ref 212.0–360.0)

## 2023-05-01 LAB — COMPREHENSIVE METABOLIC PANEL WITH GFR
ALT: 12 U/L (ref 0–35)
AST: 22 U/L (ref 0–37)
Albumin: 3.9 g/dL (ref 3.5–5.2)
Alkaline Phosphatase: 86 U/L (ref 39–117)
BUN: 12 mg/dL (ref 6–23)
CO2: 26 meq/L (ref 19–32)
Calcium: 9.3 mg/dL (ref 8.4–10.5)
Chloride: 98 meq/L (ref 96–112)
Creatinine, Ser: 0.72 mg/dL (ref 0.40–1.20)
GFR: 73.77 mL/min (ref >60.00)
Glucose, Bld: 93 mg/dL (ref 70–99)
Potassium: 4.1 meq/L (ref 3.5–5.1)
Sodium: 130 meq/L — ABNORMAL LOW (ref 135–145)
Total Bilirubin: 0.6 mg/dL (ref 0.2–1.2)
Total Protein: 7.6 g/dL (ref 6.0–8.3)

## 2023-05-01 LAB — CBC WITH DIFFERENTIAL/PLATELET
Basophils Absolute: 0.1 10*3/uL (ref 0.0–0.1)
Basophils Relative: 0.8 % (ref 0.0–3.0)
Eosinophils Absolute: 0.2 10*3/uL (ref 0.0–0.7)
Eosinophils Relative: 3.7 % (ref 0.0–5.0)
HCT: 42.9 % (ref 36.0–46.0)
Hemoglobin: 14.1 g/dL (ref 12.0–15.0)
Lymphocytes Relative: 20.6 % (ref 12.0–46.0)
Lymphs Abs: 1.4 10*3/uL (ref 0.7–4.0)
MCHC: 32.8 g/dL (ref 30.0–36.0)
MCV: 96.8 fl (ref 78.0–100.0)
Monocytes Absolute: 0.6 10*3/uL (ref 0.1–1.0)
Monocytes Relative: 8.3 % (ref 3.0–12.0)
Neutro Abs: 4.5 10*3/uL (ref 1.4–7.7)
Neutrophils Relative %: 66.6 % (ref 43.0–77.0)
Platelets: 155 10*3/uL (ref 150.0–400.0)
RBC: 4.43 Mil/uL (ref 3.87–5.11)
RDW: 13.8 % (ref 11.5–15.5)
WBC: 6.7 10*3/uL (ref 4.0–10.5)

## 2023-05-01 LAB — VITAMIN D 25 HYDROXY (VIT D DEFICIENCY, FRACTURES): VITD: 54.23 ng/mL (ref 30.00–100.00)

## 2023-05-01 LAB — VITAMIN B12: Vitamin B-12: 1121 pg/mL — ABNORMAL HIGH (ref 211–911)

## 2023-05-01 NOTE — Patient Instructions (Signed)
 It was a pleasure meeting you today. Thank you for allowing me to take part in your health care.  Our goals for today as we discussed include:  We will get some labs today.  If they are abnormal or we need to do something about them, I will call you.  If they are normal, I will send you a message on MyChart (if it is active) or a letter in the mail.  If you don't hear from us  in 2 weeks, please call the office at the number below.     This is a list of the screening recommended for you and due dates:  Health Maintenance  Topic Date Due   Medicare Annual Wellness Visit  05/17/2023   COVID-19 Vaccine (9 - Moderna risk 2024-25 season) 05/23/2023   Flu Shot  08/09/2023   DTaP/Tdap/Td vaccine (2 - Td or Tdap) 05/03/2030   Pneumonia Vaccine  Completed   Zoster (Shingles) Vaccine  Completed   DEXA scan (bone density measurement)  Addressed   HPV Vaccine  Aged Out   Meningitis B Vaccine  Aged Out      If you have any questions or concerns, please do not hesitate to call the office at [phone].  I look forward to our next visit and until then take care and stay safe.  Regards,   Valli Gaw, MD   [facility]

## 2023-05-01 NOTE — Progress Notes (Signed)
 SUBJECTIVE:   Chief Complaint  Patient presents with   Weight Loss   Fatigue    Over the last 2 month   HPI Presents for acute visit  Discussed the use of AI scribe software for clinical note transcription with the patient, who gave verbal consent to proceed.  History of Present Illness Wanda Nelson is a 89 year old female with pulmonary fibrosis who presents with unintentional weight loss.  She has experienced a significant weight loss of 25 pounds over the past 18 months, with a recent loss of 4 pounds since March 16. Despite consuming high-calorie foods, she continues to lose weight. She avoids fresh fruits, vegetable salads, and roughage due to gastrointestinal discomfort. She experiences alternating constipation and diarrhea, and has tried MiraLAX  with inconsistent results. A colonoscopy in October was negative, and a CT scan of the abdomen showed no abnormalities. No abdominal pain, but she reports experiencing a lot of gas. Nutritional supplements like Boost cause gastrointestinal upset. She attempts to eat three meals a day and snacks occasionally, but large liquid intake affects her ability to eat solid foods.  She has a history of pulmonary fibrosis, which has been causing increased shortness of breath, especially when walking across the room. Some days are worse than others, and her breathing has been more labored recently. She remains active, walking about a mile when possible, but has reduced her physical activity due to her breathing difficulties. She is concerned about maintaining her health for an upcoming trip in June.  Her past medical history includes a hiatal hernia surgery 18 months ago, after which she began losing weight. She also has a history of skin carcinoma on her right upper arm, which was treated by a dermatologist. She has a history of low hemoglobin levels, but recent labs have been normal, including thyroid  function, blood sugar, and kidney function  tests.    PERTINENT PMH / PSH: As above  OBJECTIVE:  BP 130/70   Pulse 62   Temp 97.7 F (36.5 C)   Resp 20   Ht 5\' 1"  (1.549 m)   Wt 91 lb (41.3 kg)   LMP  (LMP Unknown)   SpO2 97%   BMI 17.19 kg/m    Physical Exam Vitals reviewed.  Constitutional:      General: She is not in acute distress.    Appearance: Normal appearance. She is not ill-appearing, toxic-appearing or diaphoretic.  Eyes:     General:        Right eye: No discharge.        Left eye: No discharge.     Conjunctiva/sclera: Conjunctivae normal.  Cardiovascular:     Rate and Rhythm: Normal rate and regular rhythm.     Heart sounds: Normal heart sounds.  Pulmonary:     Breath sounds: Normal breath sounds.  Abdominal:     General: Bowel sounds are normal.  Musculoskeletal:        General: Normal range of motion.  Skin:    General: Skin is warm and dry.  Neurological:     General: No focal deficit present.     Mental Status: She is alert and oriented to person, place, and time. Mental status is at baseline.  Psychiatric:        Mood and Affect: Mood normal.        Behavior: Behavior normal.        Thought Content: Thought content normal.        Judgment: Judgment normal.  05/01/2023   11:12 AM 02/15/2023    7:50 AM 12/27/2022    9:19 AM 08/27/2022   11:17 AM 05/15/2022    8:38 AM  Depression screen PHQ 2/9  Decreased Interest 1 0 0 0 0  Down, Depressed, Hopeless 0 0 0 0 0  PHQ - 2 Score 1 0 0 0 0  Altered sleeping 0 0 0 0 0  Tired, decreased energy 1 0 0 0 0  Change in appetite 0 0 0 0 0  Feeling bad or failure about yourself  0 0 0 0 0  Trouble concentrating 0 0 0 0 0  Moving slowly or fidgety/restless 0 0 0 0 0  Suicidal thoughts 0 0 0 0 0  PHQ-9 Score 2 0 0 0 0  Difficult doing work/chores Somewhat difficult Not difficult at all Not difficult at all Not difficult at all Not difficult at all      05/01/2023   11:12 AM 02/15/2023    7:50 AM 12/27/2022    9:19 AM 08/27/2022    11:17 AM  GAD 7 : Generalized Anxiety Score  Nervous, Anxious, on Edge 0 0 0 0  Control/stop worrying 0 0 0 0  Worry too much - different things 0 0 0 0  Trouble relaxing 0 0 0 0  Restless 0 0 0 0  Easily annoyed or irritable 0 0 0 0  Afraid - awful might happen 0 0 0 0  Total GAD 7 Score 0 0 0 0  Anxiety Difficulty Not difficult at all Not difficult at all Not difficult at all Not difficult at all    ASSESSMENT/PLAN:  Weight loss -     CBC with Differential/Platelet -     Thyroid  Panel With TSH -     Vitamin B12 -     Comprehensive metabolic panel with GFR  Vitamin D  deficiency -     VITAMIN D  25 Hydroxy (Vit-D Deficiency, Fractures)  Iron deficiency anemia, unspecified iron deficiency anemia type -     CBC with Differential/Platelet -     IBC + Ferritin  Weight loss, non-intentional Assessment & Plan: Persistent weight loss post-hiatal hernia surgery despite high-calorie intake. Differential includes malabsorption, malignancy, or metabolic causes. Previous labs and CT scans unremarkable. Further gastroenterology evaluation needed. - Order blood work including thyroid  function tests. - Refer to gastroenterology for further evaluation. - Consider endoscopy for malabsorption or other gastrointestinal issues. - Consult with a nutritionist to optimize dietary intake. - Consider hospital admission for further evaluation if outpatient options are insufficient.    Pulmonary fibrosis (HCC) Assessment & Plan: Pulmonary fibrosis with worsening dyspnea, potentially increasing metabolic rate and contributing to weight loss. Symptoms vary with environmental factors. - Review lab results to determine if pulmonology follow-up needs to be advanced. - Follow up with Pulmonology   Lung nodule Assessment & Plan: Recent CT chest recommended repeat follow up for new nodule.  Given weight loss, low sodium have discussed with her Pulmonologist who will plan to see in couple of days.  Cannot  rule out malignancy.       PDMP reviewed  Return if symptoms worsen or fail to improve, for PCP.  Valli Gaw, MD

## 2023-05-02 ENCOUNTER — Encounter: Payer: Self-pay | Admitting: Family Medicine

## 2023-05-02 LAB — THYROID PANEL WITH TSH
Free Thyroxine Index: 2.3 (ref 1.4–3.8)
T3 Uptake: 37 % — ABNORMAL HIGH (ref 22–35)
T4, Total: 6.1 ug/dL (ref 5.1–11.9)
TSH: 1.37 m[IU]/L (ref 0.40–4.50)

## 2023-05-02 NOTE — Telephone Encounter (Signed)
 I did take off the hydrochlorothiazide  off pt med list.

## 2023-05-03 DIAGNOSIS — R918 Other nonspecific abnormal finding of lung field: Secondary | ICD-10-CM | POA: Diagnosis not present

## 2023-05-03 DIAGNOSIS — J301 Allergic rhinitis due to pollen: Secondary | ICD-10-CM | POA: Diagnosis not present

## 2023-05-03 DIAGNOSIS — E042 Nontoxic multinodular goiter: Secondary | ICD-10-CM | POA: Diagnosis not present

## 2023-05-03 DIAGNOSIS — J849 Interstitial pulmonary disease, unspecified: Secondary | ICD-10-CM | POA: Diagnosis not present

## 2023-05-03 NOTE — Telephone Encounter (Signed)
 noted

## 2023-05-03 NOTE — Telephone Encounter (Signed)
 Noted.

## 2023-05-06 ENCOUNTER — Other Ambulatory Visit: Payer: Self-pay | Admitting: Pulmonary Disease

## 2023-05-06 DIAGNOSIS — E042 Nontoxic multinodular goiter: Secondary | ICD-10-CM

## 2023-05-06 DIAGNOSIS — R918 Other nonspecific abnormal finding of lung field: Secondary | ICD-10-CM

## 2023-05-09 ENCOUNTER — Ambulatory Visit: Payer: Medicare Other | Admitting: Podiatry

## 2023-05-12 ENCOUNTER — Encounter: Payer: Self-pay | Admitting: Family Medicine

## 2023-05-12 NOTE — Assessment & Plan Note (Signed)
 Persistent weight loss post-hiatal hernia surgery despite high-calorie intake. Differential includes malabsorption, malignancy, or metabolic causes. Previous labs and CT scans unremarkable. Further gastroenterology evaluation needed. - Order blood work including thyroid  function tests. - Refer to gastroenterology for further evaluation. - Consider endoscopy for malabsorption or other gastrointestinal issues. - Consult with a nutritionist to optimize dietary intake. - Consider hospital admission for further evaluation if outpatient options are insufficient.

## 2023-05-12 NOTE — Assessment & Plan Note (Signed)
 Pulmonary fibrosis with worsening dyspnea, potentially increasing metabolic rate and contributing to weight loss. Symptoms vary with environmental factors. - Review lab results to determine if pulmonology follow-up needs to be advanced. - Follow up with Pulmonology

## 2023-05-12 NOTE — Assessment & Plan Note (Signed)
 Recent CT chest recommended repeat follow up for new nodule.  Given weight loss, low sodium have discussed with her Pulmonologist who will plan to see in couple of days.  Cannot rule out malignancy.

## 2023-05-16 ENCOUNTER — Encounter: Payer: Self-pay | Admitting: Family Medicine

## 2023-05-17 ENCOUNTER — Ambulatory Visit
Admission: RE | Admit: 2023-05-17 | Discharge: 2023-05-17 | Disposition: A | Discharge status: Home / Self Care | Source: Ambulatory Visit | Attending: Pulmonary Disease

## 2023-05-17 DIAGNOSIS — E042 Nontoxic multinodular goiter: Secondary | ICD-10-CM | POA: Insufficient documentation

## 2023-05-17 DIAGNOSIS — H748X2 Other specified disorders of left middle ear and mastoid: Secondary | ICD-10-CM | POA: Diagnosis not present

## 2023-05-17 DIAGNOSIS — I517 Cardiomegaly: Secondary | ICD-10-CM | POA: Insufficient documentation

## 2023-05-17 DIAGNOSIS — K5730 Diverticulosis of large intestine without perforation or abscess without bleeding: Secondary | ICD-10-CM | POA: Diagnosis not present

## 2023-05-17 DIAGNOSIS — I700 Atherosclerosis of aorta: Secondary | ICD-10-CM | POA: Diagnosis not present

## 2023-05-17 DIAGNOSIS — R918 Other nonspecific abnormal finding of lung field: Secondary | ICD-10-CM | POA: Diagnosis not present

## 2023-05-17 DIAGNOSIS — R911 Solitary pulmonary nodule: Secondary | ICD-10-CM

## 2023-05-17 LAB — GLUCOSE, CAPILLARY: Glucose-Capillary: 58 mg/dL — ABNORMAL LOW (ref 70–99)

## 2023-05-17 MED ORDER — FLUDEOXYGLUCOSE F - 18 (FDG) INJECTION
5.4900 | Freq: Once | INTRAVENOUS | Status: AC | PRN
  Administered 2023-05-17: 5.49 via INTRAVENOUS

## 2023-05-20 ENCOUNTER — Telehealth: Payer: Self-pay | Admitting: *Deleted

## 2023-05-20 ENCOUNTER — Other Ambulatory Visit: Payer: Self-pay | Admitting: Pulmonary Disease

## 2023-05-20 DIAGNOSIS — R197 Diarrhea, unspecified: Secondary | ICD-10-CM | POA: Diagnosis not present

## 2023-05-20 DIAGNOSIS — R0689 Other abnormalities of breathing: Secondary | ICD-10-CM | POA: Diagnosis not present

## 2023-05-20 DIAGNOSIS — R918 Other nonspecific abnormal finding of lung field: Secondary | ICD-10-CM | POA: Diagnosis not present

## 2023-05-20 DIAGNOSIS — R0600 Dyspnea, unspecified: Secondary | ICD-10-CM | POA: Diagnosis not present

## 2023-05-20 DIAGNOSIS — J8410 Pulmonary fibrosis, unspecified: Secondary | ICD-10-CM | POA: Diagnosis not present

## 2023-05-20 NOTE — Telephone Encounter (Signed)
   Name: Wanda Nelson  DOB: Jul 03, 1931  MRN: [id number]  Primary Cardiologist: Antionette Kirks, MD   Preoperative team, please contact this patient and set up a phone call appointment for further preoperative risk assessment. Please obtain consent and complete medication review. Thank you for your help.  I confirm that guidance regarding antiplatelet and oral anticoagulation therapy has been completed and, if necessary, noted below.  Aspirin  prescribed by a noncardiology provider therefore recommendations for holding deferred to prescribing provider.    I also confirmed the patient resides in the state of [place] . As per [facility] Medical Board telemedicine laws, the patient must reside in the state in which the provider is licensed.   Morey Ar, NP 05/20/2023, 2:25 PM [facility]

## 2023-05-20 NOTE — Telephone Encounter (Signed)
 Pt has been scheduled for a tele visit, 05/21/23 2:40.  Consent on file / medications reconciled.     Patient Consent for Virtual Visit   3      [name] has provided verbal consent on 05/20/2023 for a virtual visit (video or telephone).   CONSENT FOR VIRTUAL VISIT FOR:  Wanda Nelson  By participating in this virtual visit I agree to the following:  I hereby voluntarily request, consent and authorize [facility] and its employed or contracted physicians, physician assistants, nurse practitioners or other licensed health care professionals (the Practitioner), to provide me with telemedicine health care services (the "Services") as deemed necessary by the treating Practitioner. I acknowledge and consent to receive the Services by the Practitioner via telemedicine. I understand that the telemedicine visit will involve communicating with the Practitioner through live audiovisual communication technology and the disclosure of certain medical information by electronic transmission. I acknowledge that I have been given the opportunity to request an in-person assessment or other available alternative prior to the telemedicine visit and am voluntarily participating in the telemedicine visit.  I understand that I have the right to withhold or withdraw my consent to the use of telemedicine in the course of my care at any time, without affecting my right to future care or treatment, and that the Practitioner or I may terminate the telemedicine visit at any time. I understand that I have the right to inspect all information obtained and/or recorded in the course of the telemedicine visit and may receive copies of available information for a reasonable fee.  I understand that some of the potential risks of receiving the Services via telemedicine include:  Delay or interruption in medical evaluation due to technological equipment failure or disruption; Information transmitted may  not be sufficient (e.g. poor resolution of images) to allow for appropriate medical decision making by the Practitioner; and/or  In rare instances, security protocols could fail, causing a breach of personal health information.  Furthermore, I acknowledge that it is my responsibility to provide information about my medical history, conditions and care that is complete and accurate to the best of my ability. I acknowledge that Practitioner's advice, recommendations, and/or decision may be based on factors not within their control, such as incomplete or inaccurate data provided by me or distortions of diagnostic images or specimens that may result from electronic transmissions. I understand that the practice of medicine is not an exact science and that Practitioner makes no warranties or guarantees regarding treatment outcomes. I acknowledge that a copy of this consent can be made available to me via my patient portal [facility] MyChart), or I can request a printed copy by calling the office of [facility].    I understand that my insurance will be billed for this visit.   I have read or had this consent read to me. I understand the contents of this consent, which adequately explains the benefits and risks of the Services being provided via telemedicine.  I have been provided ample opportunity to ask questions regarding this consent and the Services and have had my questions answered to my satisfaction. I give my informed consent for the services to be provided through the use of telemedicine in my medical care

## 2023-05-20 NOTE — Telephone Encounter (Signed)
   Pre-operative Risk Assessment    Patient Name: Wanda Nelson  DOB: [birth year] MRN: [id number]   Date of last office visit: 11/2022 Date of next office visit: 07/2023   Request for Surgical Clearance    Procedure:  EBUS / ROBOTIC BRONCHOSCOPY  Date of Surgery:  Clearance 05/24/23                                Surgeon:  Erskin Hearing, MD Surgeon's Group or Practice Name:  [facility] PULMONOLOGY Phone number:  [phone] Fax number:  [phone]   Type of Clearance Requested:   - Medical  - Pharmacy:  Hold Aspirin  NOT INDICATED HOW LONG   Type of Anesthesia:  General    Additional requests/questions:    Wanda Nelson   05/20/2023, 1:40 PM

## 2023-05-20 NOTE — Telephone Encounter (Signed)
 Pt has been scheduled for a tele visit, 05/21/23 2:40.  Consent on file / medications reconciled.

## 2023-05-21 ENCOUNTER — Ambulatory Visit

## 2023-05-21 ENCOUNTER — Ambulatory Visit
Admission: RE | Admit: 2023-05-21 | Discharge: 2023-05-21 | Disposition: A | Discharge status: Home / Self Care | Source: Ambulatory Visit | Attending: Pulmonary Disease | Admitting: Pulmonary Disease

## 2023-05-21 ENCOUNTER — Ambulatory Visit: Attending: Internal Medicine | Admitting: Student

## 2023-05-21 ENCOUNTER — Other Ambulatory Visit: Payer: Self-pay | Admitting: Pulmonary Disease

## 2023-05-21 DIAGNOSIS — Z01810 Encounter for preprocedural cardiovascular examination: Secondary | ICD-10-CM

## 2023-05-21 DIAGNOSIS — J8410 Pulmonary fibrosis, unspecified: Secondary | ICD-10-CM

## 2023-05-21 DIAGNOSIS — R918 Other nonspecific abnormal finding of lung field: Secondary | ICD-10-CM

## 2023-05-21 DIAGNOSIS — Z01818 Encounter for other preprocedural examination: Secondary | ICD-10-CM | POA: Diagnosis not present

## 2023-05-21 DIAGNOSIS — I77810 Thoracic aortic ectasia: Secondary | ICD-10-CM | POA: Diagnosis not present

## 2023-05-21 NOTE — Progress Notes (Signed)
 Virtual Visit via Telephone Note   Because of Candida Goulder Gassner's co-morbid illnesses, she is at least at moderate risk for complications without adequate follow up.  This format is felt to be most appropriate for this patient at this time.  The patient did not have access to video technology/had technical difficulties with video requiring transitioning to audio format only (telephone).  All issues noted in this document were discussed and addressed.  No physical exam could be performed with this format.  Please refer to the patient's chart for her consent to telehealth for [facility].  Evaluation Performed:  Preoperative cardiovascular risk assessment _____________   Date:  05/21/2023   Patient ID:  Wanda Nelson, DOB 13-Nov-1931, MRN [id number] Patient Location:  Home Provider location:   Office  Primary Care Provider:  Valli Gaw, MD Primary Cardiologist:  Antionette Kirks, MD  Chief Complaint / Patient Profile   89 y.o. y/o female with a h/o atrial myxoma s/p surgical resection March 2020, postop A-fib not on anticoagulation, hypertension, hyperlipidemia, asymptomatic sinus bradycardia, pulmonary fibrosis, mesenteric arterial embolization August 2024, remote history of stroke who is pending EBUS/robotic bronchoscopy by Dr. Jaclynn Mast on 05/24/2023 and presents today for telephonic preoperative cardiovascular risk assessment.  History of Present Illness    Wanda Nelson is a 89 y.o. female who presents via audio/video conferencing for a telehealth visit today.  Pt was last seen in cardiology clinic on 11/29/2022 by Dr. Alvenia Aus.  At that time Wanda Nelson was stable from a cardiac standpoint.  The patient is now pending procedure as outlined above. Since her last visit, she is doing well. Patient denies lower extremity edema, orthopnea or PND. She has stable chronic shortness of breath. No chest pain, pressure, or tightness. No palpitations. She is very  active walking 1 mile 6 days a week.   Past Medical History    Past Medical History:  Diagnosis Date   Actinic keratosis    Anemia    Aortic atherosclerosis (HCC)    Atrial myxoma 03/17/2018   a.) TTE 03/17/2018 --> 2.4 x 2.5 cm LEFT atrial mass attached at fossa ovalis/atrial septum; pedunculated in shape and solid in texture; b.) s/p atrial myoxma resection 03/21/2018; initially attempted via minimally invasive RIGHT thoracotomy approach, however due to bleeding complications, procedure required median sternotomy and patient going on cardiopulmonary bypass   Basal cell carcinoma 05/17/2021   L upper back 5.0 cm lat to spine - ED&C   Diastolic dysfunction    a.) TTE 03/17/2018: EF 55-60%, LVH, mild PR, G1DD; b.) TTE 07/25/2018: EF 60-65%, RVSP 44.9, mild-mod MR/TR, G1DD; c.) TTE 12/18/2018: EF 60-65%; d.) TTE 06/12/2019: EF 50-55, mild BAE, triv TR, PASP 37.5, G2DD; e.) TTE 07/22/2020: EF 60-65%, mild MR, mod TR, RVSP 42.7, G1DD; f.) TTE 05/08/2021: EF 60-65%, mild LVH, mild-mod TR, AoV sclerosis without stenosis   Dyspnea    Fatigue 12/19/2021   GERD (gastroesophageal reflux disease)    Glaucoma    Hemorrhagic cerebrovascular accident (CVA) (HCC) 1990   Hiatal hernia    Hiatal hernia 05/16/2021   History of 2019 novel coronavirus disease (COVID-19)    a.) 05/30/20 - Tx'd with "covid 19 pill"; b.) 10/2021   History of blood transfusion    History of hiatal hernia    History of left atrial appendage closure 03/21/2018   a.) #45 Atricure   Hyperlipidemia    Hypertension    Hypoxia    Incidental pulmonary nodule, > 3mm and < 8mm  03/20/2018   Noted on CT scan - needs f/u imaging 6 months   Lung fibrosis (HCC) 11/16/2020   NSVT (nonsustained ventricular tachycardia) (HCC) 05/13/2018   a.) holter 05/13/2018 --> 5 short runs   PAF (paroxysmal atrial fibrillation) (HCC) 05/13/2018   a.) holter 05/13/2018 --> intermittent episodes with longest lasting approx 1.5 hours (burden <1%).    Pain due to onychomycosis of toenails of both feet 07/28/2020   Positive self-administered antigen test for COVID-19 10/18/2021   PSVT (paroxysmal supraventricular tachycardia) (HCC) 05/13/2018   a.) holter 05/13/2018 --> frequent runs of SVT noted during study   Pulmonary fibrosis (HCC)    Squamous cell carcinoma of skin 11/12/2019   L distal dorsum forearm - ED&C    Stroke (HCC)    Viral URI 05/31/2020   Past Surgical History:  Procedure Laterality Date   BUNIONECTOMY Bilateral    CLIPPING OF ATRIAL APPENDAGE  03/21/2018   Procedure: Clipping Of Atrial Appendage - using an AtriCure 45mm clip;  Surgeon: Gardenia Jump, MD;  Location: [facility] OR;  Service: Open Heart Surgery;;   COLONOSCOPY WITH PROPOFOL  N/A 10/23/2022   Procedure: COLONOSCOPY WITH PROPOFOL ;  Surgeon: Luke Salaam, MD;  Location: [facility] ENDOSCOPY;  Service: Gastroenterology;  Laterality: N/A;   ESOPHAGOGASTRODUODENOSCOPY (EGD) WITH PROPOFOL  N/A 09/06/2021   Procedure: ESOPHAGOGASTRODUODENOSCOPY (EGD) WITH PROPOFOL ;  Surgeon: Luke Salaam, MD;  Location: [facility] ENDOSCOPY;  Service: Gastroenterology;  Laterality: N/A;   EXCISION OF ATRIAL MYXOMA N/A 03/21/2018   Procedure: Excision Of Atrial Myxoma;  Surgeon: Gardenia Jump, MD;  Location: [facility] OR;  Service: Open Heart Surgery;  Laterality: N/A;   RECTOCELE REPAIR     RIGHT/LEFT HEART CATH AND CORONARY ANGIOGRAPHY N/A 03/19/2018   Procedure: RIGHT/LEFT HEART CATH AND CORONARY ANGIOGRAPHY;  Surgeon: Wenona Hamilton, MD;  Location: [facility] INVASIVE CV LAB;  Service: Cardiovascular;  Laterality: N/A;   TEE WITHOUT CARDIOVERSION N/A 03/21/2018   Procedure: TRANSESOPHAGEAL ECHOCARDIOGRAM (TEE);  Surgeon: Gardenia Jump, MD;  Location: [facility] OR;  Service: Open Heart Surgery;  Laterality: N/A;   TONSILLECTOMY     TOTAL ABDOMINAL HYSTERECTOMY  01/09/1980   VISCERAL ANGIOGRAPHY N/A 08/17/2022   Procedure: VISCERAL ANGIOGRAPHY;  Surgeon: Celso College, MD;  Location: [facility] INVASIVE CV LAB;  Service:  Cardiovascular;  Laterality: N/A;   XI ROBOTIC ASSISTED PARAESOPHAGEAL HERNIA REPAIR N/A 11/14/2021   Procedure: XI ROBOTIC ASSISTED PARAESOPHAGEAL HERNIA REPAIR, RNFA to assist;  Surgeon: Alben Alma, MD;  Location: [facility] ORS;  Service: General;  Laterality: N/A;    Allergies  Allergies  Allergen Reactions   Bee Venom Anaphylaxis and Hives   Codeine Other (See Comments)    Altered mental status Altered mental status   Morphine And Codeine     Altered mental status   Shellfish Allergy Anaphylaxis and Hives    Mainly shrimp   Bactrim [Sulfamethoxazole-Trimethoprim]     ? Reaction     Norvasc  [Amlodipine ]     2.5 leg edema    Nitrofurantoin Rash    Home Medications    Prior to Admission medications   Medication Sig Start Date End Date Taking? Authorizing Provider  acetaminophen  (TYLENOL ) 325 MG tablet Take 2 tablets (650 mg total) by mouth every 6 (six) hours as needed for mild pain (or Fever >/= 101). 05/09/21   Ree Candy, MD  albuterol  (PROVENTIL ) (2.5 MG/3ML) 0.083% nebulizer solution Inhale 2.5 mg into the lungs daily. 09/26/22 09/26/23  [provider]  ALLERGY RELIEF 10 MG tablet TAKE 1  TABLET BY MOUTH AT BEDTIME AS NEEDED 11/22/22   Valli Gaw, MD  Ascorbic Acid  (VITAMIN C  PO) Take by mouth daily at 12 noon.    [provider]  aspirin  EC 81 MG tablet Take 1 tablet (81 mg total) by mouth daily. 05/16/21   McLean-Scocuzza, Karon Packer, MD  atorvastatin  (LIPITOR) 20 MG tablet TAKE 1 TABLET BY MOUTH DAILY AT 6PM. 11/22/22   Valli Gaw, MD  Calcium  Carbonate-Vitamin D  500-125 MG-UNIT TABS Take 1 tablet by mouth daily.    [provider]  cholecalciferol  (VITAMIN D ) 1000 units tablet Take 1,000 Units by mouth daily.    [provider]  ipratropium-albuterol  (DUONEB) 0.5-2.5 (3) MG/3ML SOLN INHALE THE CONTENTS OF ONE (1) VIAL VIA NEBULIZATION EVERY 6 HOURS AS NEEDED 08/15/21   Webb, Padonda B, FNP  losartan  (COZAAR ) 25 MG tablet TAKE 1/2 TO  1 TABLET BY MOUTH TWO TIMES DAILY; IF BLOOD PRESSURE IS OVER 140/80,TAKE ONE (1) TABLET TWICE DAILY 11/06/22   Valli Gaw, MD  Multiple Vitamin (MULTIVITAMIN) tablet Take 1 tablet by mouth daily.    [provider]  pantoprazole  (PROTONIX ) 40 MG tablet Take 1 tablet (40 mg total) by mouth 2 (two) times daily. 05/06/22   Valli Gaw, MD    Physical Exam    Vital Signs:  Wanda Nelson does not have vital signs available for review today.  Given telephonic nature of communication, physical exam is limited. AAOx3. NAD. Normal affect.  Speech and respirations are unlabored.   Assessment & Plan    Primary Cardiologist: Antionette Kirks, MD  Preoperative cardiovascular risk assessment.  EBUS/robotic bronchoscopy by Dr. Jaclynn Mast on 05/24/2023  Chart reviewed as part of pre-operative protocol coverage. According to the RCRI, patient has a 0.4% risk of MACE. Patient reports activity equivalent to >4.0 METS (walks 1 mile 6 days a week).   Given past medical history and time since last visit, based on ACC/AHA guidelines, Gaudalupe Coyle would be at acceptable risk for the planned procedure without further cardiovascular testing.   Patient was advised that if she develops new symptoms prior to surgery to contact our office to arrange a follow-up appointment.  she verbalized understanding.  Aspirin  prescribed by a noncardiology provider therefore recommendations for holding deferred to prescribing provider.     I will route this recommendation to the requesting party via Epic fax function.  Please call with questions.  Time:   Today, I have spent 5 minutes with the patient with telehealth technology discussing medical history, symptoms, and management plan.     Morey Ar, NP  05/21/2023, 8:07 AM

## 2023-05-22 ENCOUNTER — Other Ambulatory Visit: Payer: Self-pay

## 2023-05-22 ENCOUNTER — Encounter
Admission: RE | Admit: 2023-05-22 | Discharge: 2023-05-22 | Disposition: A | Discharge status: Home / Self Care | Source: Ambulatory Visit | Attending: Pulmonary Disease | Admitting: Pulmonary Disease

## 2023-05-22 HISTORY — DX: Other complications of anesthesia, initial encounter: T88.59XA

## 2023-05-22 HISTORY — DX: Essential (primary) hypertension: I10

## 2023-05-22 HISTORY — DX: Solitary pulmonary nodule: R91.1

## 2023-05-22 HISTORY — DX: Other specified postprocedural states: Z98.890

## 2023-05-22 HISTORY — DX: Nausea with vomiting, unspecified: R11.2

## 2023-05-22 NOTE — Pre-Procedure Instructions (Signed)
 During pre-op interview, patient stated that she was told by Dr. Aleskerov to stop taking her 81 mg aspirin  on May 20, 2023, so last dose was taken on May 19, 2023 for surgery scheduled May 24, 2023.

## 2023-05-22 NOTE — Patient Instructions (Addendum)
 Your procedure is scheduled on: Friday, May 16 Report to the Registration Desk on the 1st floor of the [facility] To find out your arrival time, please call [phone] between 1PM - 3PM on: Thursday, May 15 If your arrival time is 6:00 am, do not arrive before that time as the [facility] entrance doors do not open until 6:00 am.  REMEMBER: Instructions that are not followed completely may result in serious medical risk, up to and including death; or upon the discretion of your surgeon and anesthesiologist your surgery may need to be rescheduled.  Do not eat or drink after midnight the night before surgery.  No gum chewing or hard candies.  One week prior to surgery: Stop Anti-inflammatories (NSAIDS) such as Advil , Aleve, Ibuprofen , Motrin , Naproxen, Naprosyn and Aspirin  based products such as Excedrin, Goody's Powder, BC Powder. Stop ANY OVER THE COUNTER supplements until after surgery.  You may however, continue to take Tylenol  if needed for pain up until the day of surgery.  Do NOT resume aspirin  81 mg until AFTER surgery per surgeon instruction.  Continue taking all of your other prescription medications up until the day of surgery.  ON THE DAY OF SURGERY ONLY TAKE THESE MEDICATIONS WITH SIPS OF WATER:  Pantoprazole  (Protonix ) Albuterol  nebulizer  No Alcohol for 24 hours before or after surgery.  No Smoking including e-cigarettes for 24 hours before surgery.  No chewable tobacco products for at least 6 hours before surgery.  No nicotine patches on the day of surgery.  Do not use any "recreational" drugs for at least a week (preferably 2 weeks) before your surgery.  Please be advised that the combination of cocaine and anesthesia may have negative outcomes, up to and including death. If you test positive for cocaine, your surgery will be cancelled.  On the morning of surgery brush your teeth with toothpaste and water, you may rinse your mouth with mouthwash if you  wish. Do not swallow any toothpaste or mouthwash.  Do not wear jewelry, make-up, hairpins, clips or nail polish.  For welded (permanent) jewelry: bracelets, anklets, waist bands, etc.  Please have this removed prior to surgery.  If it is not removed, there is a chance that hospital personnel will need to cut it off on the day of surgery.  Do not wear lotions, powders, or perfumes.   Do not shave body hair from the neck down 48 hours before surgery.  Contact lenses, hearing aids and dentures may not be worn into surgery.  Do not bring valuables to the hospital. [facility] is not responsible for any missing/lost belongings or valuables.   Notify your doctor if there is any change in your medical condition (cold, fever, infection).  Wear comfortable clothing (specific to your surgery type) to the hospital.  If you are being discharged the day of surgery, you will not be allowed to drive home. You will need a responsible individual to drive you home and stay with you for 24 hours after surgery.   If you are taking public transportation, you will need to have a responsible individual with you.  Please call the Pre-admissions Testing Dept. at [phone] if you have any questions about these instructions.  Surgery Visitation Policy:  Patients having surgery or a procedure may have two visitors.  Children under the age of 62 must have an adult with them who is not the patient.

## 2023-05-23 ENCOUNTER — Encounter: Payer: Self-pay | Admitting: Urgent Care

## 2023-05-23 NOTE — Progress Notes (Signed)
 Perioperative / Anesthesia Services  Pre-Admission Testing Clinical Review / Pre-Operative Anesthesia Consult  Date: 05/23/23  PATIENT DEMOGRAPHICS: Name: Wanda Nelson DOB: [birth year] MRN:   [id number]  Note: Available PAT nursing documentation and vital signs have been reviewed. Clinical nursing staff has updated patient's PMH/PSHx, current medication list, and drug allergies/intolerances to ensure complete and comprehensive history available to assist care teams in MDM as it pertains to the aforementioned surgical procedure and anticipated anesthetic course. Extensive review of available clinical information personally performed. [place] PMH and PSHx updated with any diagnoses/procedures that  may have been inadvertently omitted during his intake with the pre-admission testing department's nursing staff.  PLANNED SURGICAL PROCEDURE(S):    Case: [id number] Date/Time: 05/24/23 1424   Procedures:      VIDEO BRONCHOSCOPY WITH ENDOBRONCHIAL NAVIGATION     BRONCHOSCOPY, WITH EBUS   Anesthesia type: General   Diagnosis: Lung nodule [R91.1]   Pre-op diagnosis: R91.1, Lung Nodule   Location: [facility] PROCEDURE RM 02 / [facility] ORS FOR ANESTHESIA GROUP   Surgeons: Erskin Hearing, MD     CLINICAL DISCUSSION: Available PAT nursing documentation and vital signs have been reviewed. Clinical nursing staff has updated patient's PMH/PSHx, current medication list, and drug allergies/intolerances to ensure complete and comprehensive history available to assist care teams in MDM as it pertains to the aforementioned surgical procedure and anticipated anesthetic course. Extensive review of available clinical information personally performed. [place] PMH and PSHx updated with any diagnoses/procedures that  may have been inadvertently omitted during his intake with the pre-admission testing department's nursing staff.  Wanda Nelson is a 89 y.o. female who is submitted for pre-surgical  anesthesia review and clearance prior to her undergoing the above procedure. Patient has never been a smoker. Pertinent PMH includes: PAF (s/p LAA closure), sinus bradycardia, NSVT, PSVT, atrial myxoma (s/p resection), diastolic dysfunction, hemorrhagic CVA, aortic atherosclerosis, cardiomegaly, pulmonary hypertension, HTN, HLD, pulmonary fibrosis, chronic dyspnea, RIGHT upper lobe pulmonary nodule, GERD/LPRD (on daily PPI), large hiatal hernia (s/p repair), anemia, glaucoma.   Patient is followed by cardiology Wanda Aus, MD). She was last seen in the cardiology clinic on 11/29/2022; notes reviewed. At the time of her clinic visit, patient doing well overall from a cardiovascular perspective. Patient with chronic dyspnea related to her pulmonary fibrosis diagnosis. Patient denied any chest pain,PND, orthopnea, palpitations, significant peripheral edema, weakness, fatigue, vertiginous symptoms, or presyncope/syncope. Patient with a past medical history significant for cardiovascular diagnoses. Documented physical exam was grossly benign, providing no evidence of acute exacerbation and/or decompensation of the patient's known cardiovascular conditions.  TTE performed on 03/17/2018 revealed a 2.4 x 2.5 cm LEFT atrial mass attached to the fossa ovalis/atrial septum.  Mass was pedunculated in shape and solid in texture consistent with atrial myxoma.  Patient ultimately underwent resection on 03/21/2018.  Procedure was initially attempted through a minimally invasive RIGHT thoracotomy approach, however due to bleeding complications, procedure was converted to a median sternotomy and patient was placed on cardiopulmonary bypass.  Patient also underwent LAA closure/ligation with placement of a #45 Atriacure clip curing the procedure for her myoxoma resection.   Diagnostic RIGHT/LEFT heart catheterization performed on 03/19/2018 demonstrated normal coronary anatomy with no evidence of obstructive coronary artery disease.   Left ventricular gram was not performed.  There were normal RIGHT-sided filling pressures, normal pulmonary pressure, and normal cardiac output noted.   Long-term cardiac event monitor study performed on 05/13/2018 demonstrating underlying normal sinus rhythm at an average rate of 63 bpm.  There  were 5 runs of NSVT.  Additionally, there were intermittent atrial fibrillation with RVR episodes, with the longest lasting 1 hour and 25 minutes (atrial fibrillation burden <1.0%).  There were frequent runs of SVT noted.   Most recent TTE performed on 05/08/2021 revealed a normal left ventricular systolic function with an EF of 60-65%. There was mild LVH.  There were no regional wall motion abnormalities.  Left ventricular diastolic Doppler parameters were normal.  Right ventricular size and function normal. There was tricuspid valve regurgitation. All transvalvular gradients were noted to be normal providing no evidence suggestive of valvular stenosis. Aorta normal in size with no evidence of ectasia or aneurysmal dilatation.  Patient with an atrial fibrillation diagnosis; CHA2DS2-VASc Score = 7 (age x 2, sex, HTN, CVA x 2, vascular disease). Again, she is s/p LAA closure in 03/2018. Following this procedure, patient's rate and rhythm are maintained intrinsically without the need for pharmacological intervention. She is not on oral anticoagulation therapy at this point. Blood pressure elevated at 160/90 mmHg on currently prescribed ARB (losartan ) monotherapy. She is on atorvastatin  for her HLD diagnosis and further ASCVD prevention. Patient is not diabetic. She does not have an OSAH diagnosis.   Patient is reported to be active at baseline. She is able to complete all of her ADL/IADLs without cardiovascular limitation. Per the DASI, patient is able to achieve > 4 METS of physical activity without experiencing any significant degree of angina/anginal equivalent symptoms. No changes were made to her medication regimen  during her visit with cardiology.  Patient scheduled to follow-up with outpatient cardiology in 6 months or sooner if needed.  Wanda Nelson found to have multiple pulmonary nodules in the RIGHT lung.  Areas of concern have been followed with multimodality imaging.  Most recent pet imaging performed on 05/17/2023 revealed a bilobed nodule in the RIGHT upper pulmonary lobe adjacent to the pleural margin just anterior to the major fissure measuring 2.0 x 1.3 cm with a maximum SUV of 6.8.  Patient was referred to pulmonary medicine for further evaluation and discussions regarding tissue biopsy for definitive diagnosis.  Patient subsequently scheduled for VIDEO BRONCHOSCOPY WITH ENDOBRONCHIAL NAVIGATION BRONCHOSCOPY, WITH EBUS on 05/24/2023 with Dr. Erskin Hearing, MD. Given patient's past medical history significant for cardiovascular diagnoses, presurgical cardiac clearance was sought by the PAT team. Per cardiology, "according to the RCRI, patient has a 0.4% risk of MACE. Patient reports activity equivalent to >4.0 METS (walks 1 mile 6 days a week). Given past medical history and time since last visit, based on ACC/AHA guidelines, Wanda Nelson would be at ACCEPTABLE risk for the planned procedure without further cardiovascular testing".  In review of the patient's chart, it is noted that she is on daily oral antithrombotic therapy. Dr. Aleskerov generally prefers for his patients to hold their ASA dose for 7 days prior to their procedure. Patient scheduled outside of these parameters. PAT staff reached out to MD to discuss. Patient's last dose of ASA was on 05/19/2023, which is only 4 days prior to her procedure. MD ok with this wash out period and advises that patient is ok to proceed as planned.   Patient reports previous perioperative complications with anesthesia in the past. Patient has a PMH (+) for PONV. Symptoms and history of PONV will be discussed with patient by anesthesia team on  the day of her procedure. Interventions will be ordered as deemed necessary based on patient's individual care needs as determined by anesthesiologist. In review her EMR,  it is noted that patient underwent a general anesthetic course here at [facility] in 10/2022 without documented complications.   MOST RECENT VITAL SIGNS:    05/22/2023    3:22 PM 05/01/2023   11:06 AM 02/15/2023    7:46 AM  Vitals with BMI  Height 5\' 0"  5\' 1"  5\' 1"   Weight 86 lbs 91 lbs 96 lbs 2 oz  BMI 16.8 17.2 18.17  Systolic  130 136  Diastolic  70 64  Pulse  62 51   PROVIDERS/SPECIALISTS: NOTE: Primary physician provider listed below. Patient may have been seen by APP or partner within same practice.   PROVIDER ROLE / SPECIALTY LAST Wanda Halsted, MD Pulmonary Medicine (Surgeon) 05/20/2023  Wanda Gaw, MD Primary Care Provider 05/01/2023  Wanda Kirks, MD Cardiology 11/29/2022; preop APP call 05/21/2023   ALLERGIES: Allergies  Allergen Reactions   Bee Venom Anaphylaxis and Hives   Codeine Other (See Comments)    Altered mental status   Morphine And Codeine     Altered mental status   Shellfish Allergy Anaphylaxis and Hives    Mainly shrimp   Bactrim [Sulfamethoxazole-Trimethoprim]     ? Reaction     Norvasc  [Amlodipine ]     2.5 leg edema    Nitrofurantoin Rash   CURRENT HOME MEDICATIONS: No current facility-administered medications for this encounter.    acetaminophen  (TYLENOL ) 325 MG tablet   albuterol  (PROVENTIL ) (2.5 MG/3ML) 0.083% nebulizer solution   ALLERGY RELIEF 10 MG tablet   Ascorbic Acid  (VITAMIN C  PO)   aspirin  EC 81 MG tablet   atorvastatin  (LIPITOR) 20 MG tablet   Calcium  Carbonate-Vitamin D  500-125 MG-UNIT TABS   cholecalciferol  (VITAMIN D ) 1000 units tablet   ipratropium-albuterol  (DUONEB) 0.5-2.5 (3) MG/3ML SOLN   losartan  (COZAAR ) 25 MG tablet   Multiple Vitamin (MULTIVITAMIN) tablet   pantoprazole  (PROTONIX ) 40 MG  tablet   HISTORY: Past Medical History:  Diagnosis Date   Actinic keratosis    Anemia    Aortic atherosclerosis (HCC)    Atrial myxoma 03/17/2018   a.) TTE 03/17/2018 --> 2.4 x 2.5 cm LEFT atrial mass attached at fossa ovalis/atrial septum; pedunculated in shape and solid in texture; b.) s/p atrial myoxma resection 03/21/2018; initially attempted via minimally invasive RIGHT thoracotomy approach, however due to bleeding complications, procedure required median sternotomy and patient going on cardiopulmonary bypass   Basal cell carcinoma 05/17/2021   L upper back 5.0 cm lat to spine - ED&C   Cardiomegaly    Complication of anesthesia    a.) severe PONV   Diastolic dysfunction    a.) TTE 03/17/2018: EF 55-60%, LVH, mild PR, G1DD; b.) TTE 07/25/2018: EF 60-65%, RVSP 44.9, mild-mod MR/TR, G1DD; c.) TTE 12/18/2018: EF 60-65%; d.) TTE 06/12/2019: EF 50-55, mild BAE, triv TR, PASP 37.5, G2DD; e.) TTE 07/22/2020: EF 60-65%, mild MR, mod TR, RVSP 42.7, G1DD; f.) TTE 05/08/2021: EF 60-65%, mild LVH, mild-mod TR, AoV sclerosis without stenosis   Diverticulosis    Dyspnea    Essential hypertension    Fatigue 12/19/2021   GERD (gastroesophageal reflux disease)    Glaucoma    Hemorrhagic cerebrovascular accident (CVA) (HCC) 1990   Hiatal hernia s/p repair 11/2021    History of 2019 novel coronavirus disease (COVID-19)    a.) 05/30/20 - Tx'd with "covid 19 pill"; b.) home Ag testing (+) 10/2021   History of blood transfusion    History of left atrial appendage closure 03/21/2018  a.) #45 Atricure   Hyperlipidemia    Hypoxia    Incidental pulmonary nodule, > 3mm and < 8mm 03/20/2018   Noted on CT scan - needs f/u imaging 6 months   Long-term use of aspirin  therapy    Lung fibrosis (HCC) 11/16/2020   Nodule of upper lobe of right lung    NSVT (nonsustained ventricular tachycardia) (HCC) 05/13/2018   a.) holter 05/13/2018 --> 5 short runs NSVT   PAF (paroxysmal atrial fibrillation) (HCC)  05/13/2018   a.) holter 05/13/2018 --> intermittent episodes with longest lasting approx 1.5 hours (burden <1%); b.) CHA2DS2VASc = 7 (age x2, sex, HTN, CVA x2, vascular disease); c.) s/p LAA closure 03/21/2018; d.) rate/rhythm now maintained intrinsically without pharmacological intervention; no OAC   Pain due to onychomycosis of toenails of both feet 07/28/2020   PONV (postoperative nausea and vomiting)    PSVT (paroxysmal supraventricular tachycardia) (HCC) 05/13/2018   a.) holter 05/13/2018 --> frequent runs of SVT noted during study   Pulmonary fibrosis (HCC)    Pulmonary hypertension (HCC)    Sinus bradycardia    Squamous cell carcinoma of skin 11/12/2019   L distal dorsum forearm - ED&C    Past Surgical History:  Procedure Laterality Date   BUNIONECTOMY Bilateral    CLIPPING OF ATRIAL APPENDAGE  03/21/2018   Procedure: Clipping Of Atrial Appendage - using an AtriCure 45mm clip;  Surgeon: Gardenia Jump, MD;  Location: [facility] OR;  Service: Open Heart Surgery;;   COLONOSCOPY WITH PROPOFOL  N/A 10/23/2022   Procedure: COLONOSCOPY WITH PROPOFOL ;  Surgeon: Luke Salaam, MD;  Location: [facility] ENDOSCOPY;  Service: Gastroenterology;  Laterality: N/A;   ESOPHAGOGASTRODUODENOSCOPY (EGD) WITH PROPOFOL  N/A 09/06/2021   Procedure: ESOPHAGOGASTRODUODENOSCOPY (EGD) WITH PROPOFOL ;  Surgeon: Luke Salaam, MD;  Location: [facility] ENDOSCOPY;  Service: Gastroenterology;  Laterality: N/A;   EXCISION OF ATRIAL MYXOMA N/A 03/21/2018   Procedure: Excision Of Atrial Myxoma;  Surgeon: Gardenia Jump, MD;  Location[facility] OR;  Service: Open Heart Surgery;  Laterality: N/A;   GLAUCOMA SURGERY Left 11/17/2013   RECTOCELE REPAIR     RIGHT/LEFT HEART CATH AND CORONARY ANGIOGRAPHY N/A 03/19/2018   Procedure: RIGHT/LEFT HEART CATH AND CORONARY ANGIOGRAPHY;  Surgeon: Wenona Hamilton, MD;  Location: [facility] INVASIVE CV LAB;  Service: Cardiovascular;  Laterality: N/A;   TEE WITHOUT CARDIOVERSION N/A 03/21/2018   Procedure:  TRANSESOPHAGEAL ECHOCARDIOGRAM (TEE);  Surgeon: Gardenia Jump, MD;  Location: [facility] OR;  Service: Open Heart Surgery;  Laterality: N/A;   TONSILLECTOMY     TOTAL ABDOMINAL HYSTERECTOMY  01/09/1980   TRABECULECTOMY Right 07/06/2015   VISCERAL ANGIOGRAPHY N/A 08/17/2022   Procedure: VISCERAL ANGIOGRAPHY;  Surgeon: Celso College, MD;  Location: [facility] INVASIVE CV LAB;  Service: Cardiovascular;  Laterality: N/A;   XI ROBOTIC ASSISTED PARAESOPHAGEAL HERNIA REPAIR N/A 11/14/2021   Procedure: XI ROBOTIC ASSISTED PARAESOPHAGEAL HERNIA REPAIR, RNFA to assist;  Surgeon: Alben Alma, MD;  Location: [facility] ORS;  Service: General;  Laterality: N/A;   Family History  Problem Relation Age of Onset   Colon cancer Brother    Cancer Brother        neck tumor cancerous    Esophageal varices Brother    Colon cancer Maternal Uncle    Colon cancer Maternal Grandmother    Kidney disease Brother    Coronary artery disease Brother        with stent, cabg   Breast cancer Neg Hx    Social History   Tobacco Use   Smoking  status: Never   Smokeless tobacco: Never  Substance Use Topics   Alcohol use: No    Alcohol/week: 0.0 standard drinks of alcohol   LABS:  Lab Results  Component Value Date   WBC 6.7 05/01/2023   HGB 14.1 05/01/2023   HCT 42.9 05/01/2023   MCV 96.8 05/01/2023   PLT 155.0 05/01/2023   Lab Results  Component Value Date   NA 130 (L) 05/01/2023   CL 98 05/01/2023   K 4.1 Hemolysis seen... 05/01/2023   CO2 26 05/01/2023   BUN 12 05/01/2023   CREATININE 0.72 05/01/2023   GFR 73.77 05/01/2023   CALCIUM  9.3 05/01/2023   PHOS 3.2 04/11/2022   ALBUMIN  3.9 05/01/2023   GLUCOSE 93 05/01/2023    ECG: Date: 11/29/2022  Time ECG obtained: 0919 AM Rate: 55 bpm Rhythm: SR with PACs Axis (leads I and aVF): normal Intervals: QRS 80 ms. QTc 430 ms. ST segment and T wave changes: No evidence of acute T wave abnormalities or significant ST segment elevation or depression.  Evidence of a  possible, age undetermined, prior infarct:  Yes; anterior Comparison: Similar to previous tracing obtained on 08/17/2022   IMAGING / PROCEDURES: CT SUPER D CHEST WO MONARCH PILOT performed on 05/21/2023 Stable rounded area of peripheral subpleural consolidation in the right upper lobe abutting the major fissure, corresponding to the metabolically active region on recent PET scan concerning for malignancy. Stable pulmonary fibrosis, with minimal change in the other areas of subpleural consolidation noted on prior studies without corresponding metabolic activity on PET scan Stable patulous esophagus with long segment air esophagram and gas fluid level. Aortic atherosclerosis  Coronary artery atherosclerosis.  NM PET IMAGE INITIAL (PI) SKULL BASE TO THIGH (F-18 FDG) performed on 05/17/2023 The bilobed nodule in the right upper lobe adjacent to the pleural margin just anterior to the major fissure demonstrates a maximum SUV of 6.8 with hypermetabolic activity measuring about 2.0 by 1.3 cm. This represents substantially higher focal activity than in the rest of the peripheral fibrosis in both lungs, suspicious for malignancy. Low-grade activity along the peripheral fibrosis in both lungs, likely inflammatory. Cardiomegaly. Trace ascites. Sigmoid colon diverticulosis. Left mastoid effusion. Aortic atherosclerosis.   CT CHEST HIGH RESOLUTION performed on 01/21/2023 Pulmonary parenchymal pattern of fibrosis, as detailed above, progressive from 12/11/2021. Possibility of fibrotic hypersensitivity pneumonitis cannot be excluded. Findings are indeterminate for UIP per consensus guidelines. Areas of subpleural nodular consolidation in the right hemithorax, two of which are new. Consider short-term follow-up CT chest with contrast in 1 month, as clinically indicated, as malignancy cannot be excluded. Patulous esophagus may be due to dysmotility. Achalasia can also have this appearance. Please correlate  clinically. Aortic atherosclerosis Enlarged pulmonic trunk, indicative of pulmonary arterial hypertension.  CT ANGIO GI BLEED performed on 08/17/2022 Approximately 2.5-3 cm long segment of proximal sigmoid colon exhibits asymmetric fullness and probable subtle pericolonic fat stranding on the background of several diverticula, concerning for early/subacute diverticulitis. In this region, there is suggestion of active GI bleed, as described in detail above. Underlying tumor can not be excluded on this exam. Correlation with colonoscopy is recommended. There is a stable subpleural 5 x 5 mm solid noncalcified nodule in the left lung lower lobe, present since the prior study from 03/30/2019.  The heart is enlarged in size.  There is a stable 9 x 12 mm simple cyst in the right hepatic dome. There is a stable approximately 1 cm calcification abutting the surface of the anterior superior spleen,  unchanged. There is a small sliding hiatal hernia  There are mild peripheral atherosclerotic vascular calcifications of the aorta and its major branches. There are mild -moderate multilevel degenerative changes in the visualized spine. Aortic atherosclerosis  CT ABDOMEN PELVIS W CONTRAST performed on 04/19/2022 Distended stomach with fluid and debris of uncertain etiology. Please correlate with any clinical symptoms. Left-sided colonic diverticula with moderate colonic stool. No bowel obstruction, free air or free fluid. No abdominal wall or pelvic wall hernia. Patulous esophagus with luminal contrast and air. Please correlate with any previous intervention  TRANSTHORACIC ECHOCARDIOGRAM performed on 05/08/2021 Left ventricular ejection fraction, by estimation, is 60 to 65%. The left ventricle has normal function. The left ventricle has no regional wall motion abnormalities. There is mild left ventricular hypertrophy. Left ventricular diastolic parameters were normal.  Right ventricular systolic function is  normal. The right ventricular size is normal. Tricuspid regurgitation signal is inadequate for assessing PA pressure.  The mitral valve is normal in structure. No evidence of mitral valve regurgitation. No evidence of mitral stenosis.  Tricuspid valve regurgitation is mild to moderate.  The aortic valve is normal in structure. Aortic valve regurgitation is not visualized. Aortic valve sclerosis/calcification is present, without any evidence of aortic stenosis.    LONG TERM CARDIAC EVENT MONITOR STUDY performed on 05/13/2018 Predominant underlying normal sinus rhythm with an average heart rate of 63 bpm 5 short runs of ventricular tachycardia Intermittent atrial fibrillation with RVR with the longest episode lasting 1 hour and 25 minutes.  A-fib burden <1.0% Frequent runs of SVT   RIGHT/LEFT HEART CATHETERIZATION performed on 03/19/2018 Normal coronary arteries RIGHT heart catheterization showed normal filling pressures, normal pulmonary pressure, and normal cardiac output LEFT ventricular angiography was not performed.  EF was normal by echo Recommendations: urgent surgical evaluation for resection of LEFT atrial mass (likely myxoma). The mass is mobile and at high risk for embolization.  CVTS consulted.   IMPRESSION AND PLAN: Wanda Nelson has been referred for pre-anesthesia review and clearance prior to her undergoing the planned anesthetic and procedural courses. Available labs, pertinent testing, and imaging results were personally reviewed by me in preparation for upcoming operative/procedural course. [facility] medical record has been updated following extensive record review and patient interview with PAT staff.   This patient has been appropriately cleared by cardiology with an overall ACCEPTABLE risk of patient experiencing significant perioperative cardiovascular complications. Based on clinical review performed today (05/23/23), barring any significant acute changes in the  patient's overall condition, it is anticipated that she will be able to proceed with the planned surgical intervention. Any acute changes in clinical condition may necessitate her procedure being postponed and/or cancelled. Patient will meet with anesthesia team (MD and/or CRNA) on the day of her procedure for preoperative evaluation/assessment. Questions regarding anesthetic course will be fielded at that time.   Pre-surgical instructions were reviewed with the patient during his PAT appointment, and questions were fielded to satisfaction by PAT clinical staff. She has been instructed on which medications that she will need to hold prior to surgery, as well as the ones that have been deemed safe/appropriate to take on the day of her procedure. As part of the general education provided by PAT, patient made aware both verbally and in writing, that she would need to abstain from the use of any illegal substances during her perioperative course. She was advised that failure to follow the provided instructions could necessitate case cancellation or result in serious perioperative complications up to  and including death. Patient encouraged to contact PAT and/or her surgeon's office to discuss any questions or concerns that may arise prior to surgery; verbalized understanding.   Wanda Caroline, MSN, APRN, FNP-C, CEN [facility]  Perioperative Services Nurse Practitioner Phone: [phone] Fax: [phone] 05/23/23 10:44 AM  NOTE: This note has been prepared using Dragon dictation software. Despite my best ability to proofread, there is always the potential that unintentional transcriptional errors may still occur from this process.

## 2023-05-24 ENCOUNTER — Encounter
Admission: RE | Disposition: A | Discharge status: Home / Self Care | Payer: Self-pay | Source: Home / Self Care | Attending: Pulmonary Disease

## 2023-05-24 ENCOUNTER — Other Ambulatory Visit: Payer: Self-pay

## 2023-05-24 ENCOUNTER — Ambulatory Visit

## 2023-05-24 ENCOUNTER — Encounter: Payer: Self-pay | Admitting: Podiatry

## 2023-05-24 ENCOUNTER — Other Ambulatory Visit: Payer: Self-pay | Admitting: Pulmonary Disease

## 2023-05-24 ENCOUNTER — Ambulatory Visit: Payer: Self-pay | Admitting: Urgent Care

## 2023-05-24 ENCOUNTER — Ambulatory Visit: Admitting: Podiatry

## 2023-05-24 ENCOUNTER — Ambulatory Visit
Admission: RE | Admit: 2023-05-24 | Discharge: 2023-05-24 | Disposition: A | Discharge status: Home / Self Care | Attending: Pulmonary Disease | Admitting: Pulmonary Disease

## 2023-05-24 VITALS — Ht 60.0 in | Wt 86.0 lb

## 2023-05-24 DIAGNOSIS — B351 Tinea unguium: Secondary | ICD-10-CM | POA: Diagnosis not present

## 2023-05-24 DIAGNOSIS — R918 Other nonspecific abnormal finding of lung field: Secondary | ICD-10-CM | POA: Diagnosis not present

## 2023-05-24 DIAGNOSIS — I272 Pulmonary hypertension, unspecified: Secondary | ICD-10-CM | POA: Insufficient documentation

## 2023-05-24 DIAGNOSIS — R911 Solitary pulmonary nodule: Secondary | ICD-10-CM | POA: Diagnosis not present

## 2023-05-24 DIAGNOSIS — K5790 Diverticulosis of intestine, part unspecified, without perforation or abscess without bleeding: Secondary | ICD-10-CM | POA: Insufficient documentation

## 2023-05-24 DIAGNOSIS — M79674 Pain in right toe(s): Secondary | ICD-10-CM

## 2023-05-24 DIAGNOSIS — K219 Gastro-esophageal reflux disease without esophagitis: Secondary | ICD-10-CM | POA: Insufficient documentation

## 2023-05-24 DIAGNOSIS — J988 Other specified respiratory disorders: Secondary | ICD-10-CM | POA: Diagnosis not present

## 2023-05-24 DIAGNOSIS — I700 Atherosclerosis of aorta: Secondary | ICD-10-CM | POA: Insufficient documentation

## 2023-05-24 DIAGNOSIS — Z85828 Personal history of other malignant neoplasm of skin: Secondary | ICD-10-CM | POA: Diagnosis not present

## 2023-05-24 DIAGNOSIS — K5730 Diverticulosis of large intestine without perforation or abscess without bleeding: Secondary | ICD-10-CM | POA: Insufficient documentation

## 2023-05-24 DIAGNOSIS — Z48813 Encounter for surgical aftercare following surgery on the respiratory system: Secondary | ICD-10-CM | POA: Diagnosis not present

## 2023-05-24 DIAGNOSIS — L048 Acute lymphadenitis of other sites: Secondary | ICD-10-CM | POA: Diagnosis not present

## 2023-05-24 DIAGNOSIS — Z8616 Personal history of COVID-19: Secondary | ICD-10-CM | POA: Diagnosis not present

## 2023-05-24 DIAGNOSIS — J984 Other disorders of lung: Secondary | ICD-10-CM | POA: Diagnosis not present

## 2023-05-24 DIAGNOSIS — M79675 Pain in left toe(s): Secondary | ICD-10-CM

## 2023-05-24 DIAGNOSIS — J8410 Pulmonary fibrosis, unspecified: Secondary | ICD-10-CM | POA: Diagnosis not present

## 2023-05-24 DIAGNOSIS — I10 Essential (primary) hypertension: Secondary | ICD-10-CM | POA: Diagnosis not present

## 2023-05-24 DIAGNOSIS — J189 Pneumonia, unspecified organism: Secondary | ICD-10-CM | POA: Diagnosis not present

## 2023-05-24 HISTORY — DX: Pulmonary hypertension, unspecified: I27.20

## 2023-05-24 HISTORY — DX: Diverticulosis of intestine, part unspecified, without perforation or abscess without bleeding: K57.90

## 2023-05-24 HISTORY — PX: VIDEO BRONCHOSCOPY WITH ENDOBRONCHIAL ULTRASOUND: SHX6177

## 2023-05-24 HISTORY — DX: Bradycardia, unspecified: R00.1

## 2023-05-24 HISTORY — DX: Long term (current) use of aspirin: Z79.82

## 2023-05-24 HISTORY — DX: Cardiomegaly: I51.7

## 2023-05-24 HISTORY — PX: VIDEO BRONCHOSCOPY WITH ENDOBRONCHIAL NAVIGATION: SHX6175

## 2023-05-24 SURGERY — VIDEO BRONCHOSCOPY WITH ENDOBRONCHIAL NAVIGATION
Anesthesia: General

## 2023-05-24 MED ORDER — SUGAMMADEX SODIUM 200 MG/2ML IV SOLN
INTRAVENOUS | Status: DC | PRN
  Administered 2023-05-24: 160 mg via INTRAVENOUS

## 2023-05-24 MED ORDER — ONDANSETRON HCL 4 MG/2ML IJ SOLN
INTRAMUSCULAR | Status: DC | PRN
  Administered 2023-05-24: 4 mg via INTRAVENOUS

## 2023-05-24 MED ORDER — DEXAMETHASONE SODIUM PHOSPHATE 10 MG/ML IJ SOLN
INTRAMUSCULAR | Status: DC | PRN
Start: 2023-05-24 — End: 2023-05-24
  Administered 2023-05-24: 5 mg via INTRAVENOUS

## 2023-05-24 MED ORDER — FENTANYL CITRATE (PF) 100 MCG/2ML IJ SOLN
INTRAMUSCULAR | Status: AC
  Filled 2023-05-24: qty 2

## 2023-05-24 MED ORDER — PHENYLEPHRINE HCL-NACL 20-0.9 MG/250ML-% IV SOLN
INTRAVENOUS | Status: DC | PRN
  Administered 2023-05-24: 40 ug/min via INTRAVENOUS

## 2023-05-24 MED ORDER — ROCURONIUM BROMIDE 100 MG/10ML IV SOLN
INTRAVENOUS | Status: DC | PRN
  Administered 2023-05-24: 10 mg via INTRAVENOUS
  Administered 2023-05-24: 20 mg via INTRAVENOUS
  Administered 2023-05-24: 30 mg via INTRAVENOUS

## 2023-05-24 MED ORDER — FENTANYL CITRATE (PF) 100 MCG/2ML IJ SOLN
INTRAMUSCULAR | Status: DC | PRN
  Administered 2023-05-24: 25 ug via INTRAVENOUS

## 2023-05-24 MED ORDER — EPHEDRINE 5 MG/ML INJ
INTRAVENOUS | Status: AC
  Filled 2023-05-24: qty 5

## 2023-05-24 MED ORDER — PROPOFOL 10 MG/ML IV BOLUS
INTRAVENOUS | Status: DC | PRN
  Administered 2023-05-24: 70 ug/kg/min via INTRAVENOUS
  Administered 2023-05-24: 40 mg via INTRAVENOUS

## 2023-05-24 MED ORDER — ONDANSETRON HCL 4 MG/2ML IJ SOLN
INTRAMUSCULAR | Status: AC
  Filled 2023-05-24: qty 2

## 2023-05-24 MED ORDER — LACTATED RINGERS IV SOLN: INTRAVENOUS | Status: DC

## 2023-05-24 MED ORDER — PHENYLEPHRINE HCL-NACL 20-0.9 MG/250ML-% IV SOLN
INTRAVENOUS | Status: AC
  Filled 2023-05-24: qty 250

## 2023-05-24 MED ORDER — ORAL CARE MOUTH RINSE: 15.0000 mL | Freq: Once | OROMUCOSAL | Status: DC

## 2023-05-24 MED ORDER — PROPOFOL 1000 MG/100ML IV EMUL
INTRAVENOUS | Status: AC
  Filled 2023-05-24: qty 100

## 2023-05-24 MED ORDER — LIDOCAINE HCL (CARDIAC) PF 100 MG/5ML IV SOSY
PREFILLED_SYRINGE | INTRAVENOUS | Status: DC | PRN
  Administered 2023-05-24: 20 mg via INTRAVENOUS

## 2023-05-24 MED ORDER — CHLORHEXIDINE GLUCONATE 0.12 % MT SOLN
OROMUCOSAL | Status: AC
  Filled 2023-05-24: qty 15

## 2023-05-24 MED ORDER — GLYCOPYRROLATE 0.2 MG/ML IJ SOLN
INTRAMUSCULAR | Status: DC | PRN
  Administered 2023-05-24: .2 mg via INTRAVENOUS

## 2023-05-24 MED ORDER — OXYCODONE HCL 5 MG PO TABS: 5.0000 mg | ORAL_TABLET | Freq: Once | ORAL | Status: DC | PRN

## 2023-05-24 MED ORDER — EPHEDRINE SULFATE-NACL 50-0.9 MG/10ML-% IV SOSY
PREFILLED_SYRINGE | INTRAVENOUS | Status: DC | PRN
  Administered 2023-05-24: 5 mg via INTRAVENOUS

## 2023-05-24 MED ORDER — OXYCODONE HCL 5 MG/5ML PO SOLN: 5.0000 mg | Freq: Once | ORAL | Status: DC | PRN

## 2023-05-24 MED ORDER — ONDANSETRON HCL 4 MG/2ML IJ SOLN: 4.0000 mg | Freq: Once | INTRAMUSCULAR | Status: DC | PRN

## 2023-05-24 MED ORDER — FENTANYL CITRATE (PF) 100 MCG/2ML IJ SOLN: 25.0000 ug | INTRAMUSCULAR | Status: DC | PRN

## 2023-05-24 MED ORDER — CHLORHEXIDINE GLUCONATE 0.12 % MT SOLN: 15.0000 mL | Freq: Once | OROMUCOSAL | Status: DC

## 2023-05-24 NOTE — Progress Notes (Signed)
  Subjective:  Patient ID: Wanda Nelson, female    DOB: 26-May-1931,  MRN: [id number]  89 y.o. female presents painful mycotic toenails of both feet that are difficult to trim. Pain interferes with daily activities and wearing enclosed shoe gear comfortably. Chief Complaint  Patient presents with   Nail Problem    Pt is here for [facility] PCP is Dr Sueanne Emerald and LOV was in April.   New problem(s): None   PCP is Valli Gaw, MD.  Allergies  Allergen Reactions   Bee Venom Anaphylaxis and Hives   Codeine Other (See Comments)    Altered mental status   Morphine And Codeine     Altered mental status   Shellfish Allergy Anaphylaxis and Hives    Mainly shrimp   Bactrim [Sulfamethoxazole-Trimethoprim]     ? Reaction     Norvasc  [Amlodipine ]     2.5 leg edema    Nitrofurantoin Rash    Review of Systems: Negative except as noted in the HPI.   Objective:  Wanda Nelson is a pleasant 89 y.o. female thin build in NAD. AAO x 3.  Vascular Examination: Vascular status intact b/l with palpable pedal pulses. CFT immediate b/l. Pedal hair present. No edema. No pain with calf compression b/l. Skin temperature gradient WNL b/l. No varicosities noted. No cyanosis or clubbing noted.  Neurological Examination: Sensation grossly intact b/l with 10 gram monofilament. Vibratory sensation intact b/l.  Dermatological Examination: Pedal skin with normal turgor, texture and tone b/l. No open wounds nor interdigital macerations noted. Toenails 1-5 b/l thick, discolored, elongated with subungual debris and pain on dorsal palpation. No corns, calluses nor porokeratotic lesions noted.  Musculoskeletal Examination: Muscle strength 5/5 to b/l LE.  No pain, crepitus noted b/l. No gross pedal deformities. Patient ambulates independently without assistive aids.   Radiographs: None  Last A1c:       No data to display           Assessment:   1. Pain due to onychomycosis of toenails of both  feet    Plan:  Consent given for treatment. Patient examined. All patient's and/or POA's questions/concerns addressed on today's visit. Mycotic toenails 1-5 debrided in length and girth without incident. Continue soft, supportive shoe gear daily. Report any pedal injuries to medical professional. Call office if there are any quesitons/concerns. -Patient/POA to call should there be question/concern in the interim.  Return in about 3 months (around 08/24/2023).  Wanda Nelson, DPM      [place] LOCATION: [address], [place] [address]                   Office [phone]   [facility] LOCATION: [address] [place], [place] [address] Office [phone]

## 2023-05-24 NOTE — Anesthesia Preprocedure Evaluation (Signed)
 Anesthesia Evaluation  Patient identified by MRN, date of birth, ID band Patient awake  General Assessment Comment:  Per daughter, history of what sounds like post operative cognitive dysfunction.  Reviewed: Allergy & Precautions, NPO status , Patient's Chart, lab work & pertinent test results  History of Anesthesia Complications (+) PONV and history of anesthetic complications  Airway Mallampati: III  TM Distance: <3 FB Neck ROM: full    Dental  (+) Chipped, Poor Dentition, Caps, Missing   Pulmonary shortness of breath and with exertion, COPD   Pulmonary exam normal breath sounds clear to auscultation       Cardiovascular Exercise Tolerance: Good hypertension, Pt. on medications (-) angina + Peripheral Vascular Disease and + DOE  (-) Past MI Normal cardiovascular exam Rhythm:Regular Rate:Normal - Systolic murmurs S/p cardiac surgery for atrial myxoma  Good exercise tolerance, walks 2 miles each day   Neuro/Psych  Neuromuscular disease CVA, No Residual Symptoms  negative psych ROS   GI/Hepatic Neg liver ROS, hiatal hernia,GERD  Controlled,,S/p hiatal hernia repair   Endo/Other  negative endocrine ROS    Renal/GU      Musculoskeletal   Abdominal   Peds  Hematology negative hematology ROS (+)   Anesthesia Other Findings Past Medical History: No date: Actinic keratosis No date: Anemia No date: Aortic atherosclerosis (HCC) 03/17/2018: Atrial myxoma     Comment:  a.) TTE 03/17/2018 --> 2.4 x 2.5 cm LEFT atrial mass               attached at fossa ovalis/atrial septum; pedunculated in               shape and solid in texture; b.) s/p atrial myoxma               resection 03/21/2018; initially attempted via minimally               invasive RIGHT thoracotomy approach, however due to               bleeding complications, procedure required median               sternotomy and patient going on cardiopulmonary  bypass 05/17/2021: Basal cell carcinoma     Comment:  L upper back 5.0 cm lat to spine - ED&C No date: Diastolic dysfunction     Comment:  a.) TTE 03/17/2018: EF 55-60%, LVH, mild PR, G1DD; b.)               TTE 07/25/2018: EF 60-65%, RVSP 44.9, mild-mod MR/TR,               G1DD; c.) TTE 12/18/2018: EF 60-65%; d.) TTE 06/12/2019:               EF 50-55, mild BAE, triv TR, PASP 37.5, G2DD; e.) TTE               07/22/2020: EF 60-65%, mild MR, mod TR, RVSP 42.7, G1DD;               f.) TTE 05/08/2021: EF 60-65%, mild LVH, mild-mod TR, AoV              sclerosis without stenosis No date: Dyspnea No date: GERD (gastroesophageal reflux disease) No date: Glaucoma 1990: Hemorrhagic cerebrovascular accident (CVA) (HCC) No date: Hiatal hernia No date: History of 2019 novel coronavirus disease (COVID-19)     Comment:  a.) 05/30/20 - Tx'd with "covid 19 pill"; b.) 10/2021 No date: History of blood transfusion  03/21/2018: History of left atrial appendage closure     Comment:  a.) #45 Atricure No date: Hyperlipidemia No date: Hypertension 03/20/2018: Incidental pulmonary nodule, > 3mm and < 8mm     Comment:  Noted on CT scan - needs f/u imaging 6 months 05/13/2018: NSVT (nonsustained ventricular tachycardia) (HCC)     Comment:  a.) holter 05/13/2018 --> 5 short runs 05/13/2018: PAF (paroxysmal atrial fibrillation) (HCC)     Comment:  a.) holter 05/13/2018 --> intermittent episodes with               longest lasting approx 1.5 hours (burden <1%). 05/13/2018: PSVT (paroxysmal supraventricular tachycardia)     Comment:  a.) holter 05/13/2018 --> frequent runs of SVT noted               during study No date: Pulmonary fibrosis (HCC) 11/12/2019: Squamous cell carcinoma of skin     Comment:  L distal dorsum forearm - ED&C   Past Surgical History: No date: BUNIONECTOMY; Bilateral 03/21/2018: CLIPPING OF ATRIAL APPENDAGE     Comment:  Procedure: Clipping Of Atrial Appendage - using an                AtriCure 45mm clip;  Surgeon: Gardenia Jump, MD;                Location: [facility] OR;  Service: Open Heart Surgery;; 09/06/2021: ESOPHAGOGASTRODUODENOSCOPY (EGD) WITH PROPOFOL ; N/A     Comment:  Procedure: ESOPHAGOGASTRODUODENOSCOPY (EGD) WITH               PROPOFOL ;  Surgeon: Luke Salaam, MD;  Location: [facility]               ENDOSCOPY;  Service: Gastroenterology;  Laterality: N/A; 03/21/2018: EXCISION OF ATRIAL MYXOMA; N/A     Comment:  Procedure: Excision Of Atrial Myxoma;  Surgeon: Gardenia Jump, MD;  Location: [facility] OR;  Service: Open Heart               Surgery;  Laterality: N/A; No date: RECTOCELE REPAIR 03/19/2018: RIGHT/LEFT HEART CATH AND CORONARY ANGIOGRAPHY; N/A     Comment:  Procedure: RIGHT/LEFT HEART CATH AND CORONARY               ANGIOGRAPHY;  Surgeon: Wenona Hamilton, MD;  Location:               [facility];  Service: Cardiovascular;                Laterality: N/A; 03/21/2018: TEE WITHOUT CARDIOVERSION; N/A     Comment:  Procedure: TRANSESOPHAGEAL ECHOCARDIOGRAM (TEE);                Surgeon: Gardenia Jump, MD;  Location: [facility] OR;                Service: Open Heart Surgery;  Laterality: N/A; No date: TONSILLECTOMY 01/09/1980: TOTAL ABDOMINAL HYSTERECTOMY     Reproductive/Obstetrics negative OB ROS                              Anesthesia Physical Anesthesia Plan  ASA: 3  Anesthesia Plan: General   Post-op Pain Management: Minimal or no pain anticipated   Induction: Intravenous  PONV Risk Score and Plan: 4 or greater and Propofol  infusion, TIVA, Ondansetron , Dexamethasone  and Treatment may vary due to age  or medical condition  Airway Management Planned: Oral ETT and Video Laryngoscope Planned  Additional Equipment: None  Intra-op Plan:   Post-operative Plan: Extubation in OR  Informed Consent: I have reviewed the patients History and Physical, chart, labs and discussed the procedure including the risks,  benefits and alternatives for the proposed anesthesia with the patient or authorized representative who has indicated his/her understanding and acceptance.     Dental advisory given  Plan Discussed with: CRNA and Surgeon  Anesthesia Plan Comments: (Discussed risks of anesthesia with patient and her daughter at bedside, including PONV (discussed this risk and our mitigation strategies in great detail), post operative cognitive dysfunction (discussed in great detail the inherent risk of POCD from the stresses of surgery and anesthesia on the aging human body and how there is currently no definitive way to prevent this risk), sore throat, lip/dental/eye damage. Rare risks discussed as well, such as cardiorespiratory and neurological sequelae, and allergic reactions. Discussed the role of CRNA in patient's perioperative care. Patient understands.)        Anesthesia Quick Evaluation

## 2023-05-24 NOTE — Anesthesia Postprocedure Evaluation (Signed)
 Anesthesia Post Note  Patient: Wanda Nelson  Procedure(s) Performed: VIDEO BRONCHOSCOPY WITH ENDOBRONCHIAL NAVIGATION BRONCHOSCOPY, WITH EBUS  Patient location during evaluation: PACU Anesthesia Type: General Level of consciousness: awake and alert Pain management: pain level controlled Vital Signs Assessment: post-procedure vital signs reviewed and stable Respiratory status: spontaneous breathing, nonlabored ventilation, respiratory function stable and patient connected to nasal cannula oxygen Cardiovascular status: blood pressure returned to baseline and stable Postop Assessment: no apparent nausea or vomiting Anesthetic complications: no   No notable events documented.   Last Vitals:  Vitals:   05/24/23 1645 05/24/23 1708  BP: (!) 159/57 (!) 172/79  Pulse: (!) 51 76  Resp: (!) 21 18  Temp: 36.4 C (!) 36.1 C  SpO2: 97% 92%    Last Pain:  Vitals:   05/24/23 1708  TempSrc: Temporal  PainSc: 0-No pain                 Portia Brittle Karisa Nesser

## 2023-05-24 NOTE — Anesthesia Procedure Notes (Signed)
 Procedure Name: Intubation Date/Time: 05/24/2023 2:46 PM  Performed by: Donnamae Gaba, CRNAPre-anesthesia Checklist: Patient identified, Emergency Drugs available, Suction available and Patient being monitored Patient Re-evaluated:Patient Re-evaluated prior to induction Oxygen Delivery Method: Circle system utilized Preoxygenation: Pre-oxygenation with 100% oxygen Induction Type: IV induction Ventilation: Mask ventilation without difficulty Laryngoscope Size: McGrath and 3 Tube type: Oral Tube size: 8.0 mm Number of attempts: 1 Airway Equipment and Method: Stylet and Oral airway Placement Confirmation: ETT inserted through vocal cords under direct vision, positive ETCO2 and breath sounds checked- equal and bilateral Secured at: 20 cm Tube secured with: Tape Dental Injury: Teeth and Oropharynx as per pre-operative assessment  Comments: Smooth atraumatic Intubation, no complications noted.

## 2023-05-24 NOTE — H&P (Signed)
 PULMONOLOGY         Date: 05/24/2023,   MRN# [id number] Wanda Nelson Oct 26, 1931     AdmissionWeight: 39 kg                 CurrentWeight: 39 kg   CHIEF COMPLAINT:   Hypermetabolic lung mass or right lung    HISTORY OF PRESENT ILLNESS   This is a very pleasant 89 yo with interstitial lung disease and pulmonary fibrosis as well as personal history of cancer including basal cell carcinoma, history of cardiac myxoma, GERD, diverticulosis, recent diagnosis of hypermetabolic RUL nodule.  She is overall in good health and still walks a mile.  She would like to have full scope of therapy and is here today for biopsy of lung nodule to expedite any necessary therapy for possible cancer or rule out cancer.  Reviewed risks/complications and benefits with patient, risks include infection, pneumothorax/pneumomediastinum which may require chest tube placement as well as overnight/prolonged hospitalization and possible mechanical ventilation. Other risks include bleeding and very rarely death.  Patient understands risks and wishes to proceed.  Additional questions were answered, and patient is aware that post procedure patient will be going home with family and may experience cough with possible clots on expectoration as well as phlegm which may last few days as well as hoarseness of voice post intubation and mechanical ventilation.    PAST MEDICAL HISTORY   Past Medical History:  Diagnosis Date   Actinic keratosis    Anemia    Aortic atherosclerosis (HCC)    Atrial myxoma 03/17/2018   a.) TTE 03/17/2018 --> 2.4 x 2.5 cm LEFT atrial mass attached at fossa ovalis/atrial septum; pedunculated in shape and solid in texture; b.) s/p atrial myoxma resection 03/21/2018; initially attempted via minimally invasive RIGHT thoracotomy approach, however due to bleeding complications, procedure required median sternotomy and patient going on cardiopulmonary bypass   Basal cell carcinoma  05/17/2021   L upper back 5.0 cm lat to spine - ED&C   Cardiomegaly    Complication of anesthesia    a.) severe PONV   Diastolic dysfunction    a.) TTE 03/17/2018: EF 55-60%, LVH, mild PR, G1DD; b.) TTE 07/25/2018: EF 60-65%, RVSP 44.9, mild-mod MR/TR, G1DD; c.) TTE 12/18/2018: EF 60-65%; d.) TTE 06/12/2019: EF 50-55, mild BAE, triv TR, PASP 37.5, G2DD; e.) TTE 07/22/2020: EF 60-65%, mild MR, mod TR, RVSP 42.7, G1DD; f.) TTE 05/08/2021: EF 60-65%, mild LVH, mild-mod TR, AoV sclerosis without stenosis   Diverticulosis    Dyspnea    Essential hypertension    Fatigue 12/19/2021   GERD (gastroesophageal reflux disease)    Glaucoma    Hemorrhagic cerebrovascular accident (CVA) (HCC) 1990   Hiatal hernia s/p repair 11/2021    History of 2019 novel coronavirus disease (COVID-19)    a.) 05/30/20 - Tx'd with "covid 19 pill"; b.) home Ag testing (+) 10/2021   History of blood transfusion    History of left atrial appendage closure 03/21/2018   a.) #45 Atricure   Hyperlipidemia    Hypoxia    Incidental pulmonary nodule, > 3mm and < 8mm 03/20/2018   Noted on CT scan - needs f/u imaging 6 months   Long-term use of aspirin  therapy    Lung fibrosis (HCC) 11/16/2020   Nodule of upper lobe of right lung    NSVT (nonsustained ventricular tachycardia) (HCC) 05/13/2018   a.) holter 05/13/2018 --> 5 short runs NSVT   PAF (paroxysmal atrial fibrillation) (HCC)  05/13/2018   a.) holter 05/13/2018 --> intermittent episodes with longest lasting approx 1.5 hours (burden <1%); b.) CHA2DS2VASc = 7 (age x2, sex, HTN, CVA x2, vascular disease); c.) s/p LAA closure 03/21/2018; d.) rate/rhythm now maintained intrinsically without pharmacological intervention; no OAC   Pain due to onychomycosis of toenails of both feet 07/28/2020   PONV (postoperative nausea and vomiting)    PSVT (paroxysmal supraventricular tachycardia) (HCC) 05/13/2018   a.) holter 05/13/2018 --> frequent runs of SVT noted during study   Pulmonary  fibrosis (HCC)    Pulmonary hypertension (HCC)    Sinus bradycardia    Squamous cell carcinoma of skin 11/12/2019   L distal dorsum forearm - ED&C      SURGICAL HISTORY   Past Surgical History:  Procedure Laterality Date   BUNIONECTOMY Bilateral    CLIPPING OF ATRIAL APPENDAGE  03/21/2018   Procedure: Clipping Of Atrial Appendage - using an AtriCure 45mm clip;  Surgeon: Gardenia Jump, MD;  Location: [facility] OR;  Service: Open Heart Surgery;;   COLONOSCOPY WITH PROPOFOL  N/A 10/23/2022   Procedure: COLONOSCOPY WITH PROPOFOL ;  Surgeon: Luke Salaam, MD;  Location: [facility] ENDOSCOPY;  Service: Gastroenterology;  Laterality: N/A;   ESOPHAGOGASTRODUODENOSCOPY (EGD) WITH PROPOFOL  N/A 09/06/2021   Procedure: ESOPHAGOGASTRODUODENOSCOPY (EGD) WITH PROPOFOL ;  Surgeon: Luke Salaam, MD;  Location: [facility] ENDOSCOPY;  Service: Gastroenterology;  Laterality: N/A;   EXCISION OF ATRIAL MYXOMA N/A 03/21/2018   Procedure: Excision Of Atrial Myxoma;  Surgeon: Gardenia Jump, MD;  Location: [facility] OR;  Service: Open Heart Surgery;  Laterality: N/A;   GLAUCOMA SURGERY Left 11/17/2013   RECTOCELE REPAIR     RIGHT/LEFT HEART CATH AND CORONARY ANGIOGRAPHY N/A 03/19/2018   Procedure: RIGHT/LEFT HEART CATH AND CORONARY ANGIOGRAPHY;  Surgeon: Wenona Hamilton, MD;  Location: [facility] INVASIVE CV LAB;  Service: Cardiovascular;  Laterality: N/A;   TEE WITHOUT CARDIOVERSION N/A 03/21/2018   Procedure: TRANSESOPHAGEAL ECHOCARDIOGRAM (TEE);  Surgeon: Gardenia Jump, MD;  Location: [facility] OR;  Service: Open Heart Surgery;  Laterality: N/A;   TONSILLECTOMY     TOTAL ABDOMINAL HYSTERECTOMY  01/09/1980   TRABECULECTOMY Right 07/06/2015   VISCERAL ANGIOGRAPHY N/A 08/17/2022   Procedure: VISCERAL ANGIOGRAPHY;  Surgeon: Celso College, MD;  Location: [facility] INVASIVE CV LAB;  Service: Cardiovascular;  Laterality: N/A;   XI ROBOTIC ASSISTED PARAESOPHAGEAL HERNIA REPAIR N/A 11/14/2021   Procedure: XI ROBOTIC ASSISTED PARAESOPHAGEAL HERNIA REPAIR,  RNFA to assist;  Surgeon: Alben Alma, MD;  Location: [facility] ORS;  Service: General;  Laterality: N/A;     FAMILY HISTORY   Family History  Problem Relation Age of Onset   Colon cancer Brother    Cancer Brother        neck tumor cancerous    Esophageal varices Brother    Colon cancer Maternal Uncle    Colon cancer Maternal Grandmother    Kidney disease Brother    Coronary artery disease Brother        with stent, cabg   Breast cancer Neg Hx      SOCIAL HISTORY   Social History   Tobacco Use   Smoking status: Never   Smokeless tobacco: Never  Vaping Use   Vaping status: Never Used  Substance Use Topics   Alcohol use: No    Alcohol/week: 0.0 standard drinks of alcohol   Drug use: No     MEDICATIONS    Home Medication:    Current Medication:  Current Facility-Administered Medications:    chlorhexidine  (PERIDEX ) 0.12 %  solution 15 mL, 15 mL, Mouth/Throat, Once **OR** Oral care mouth rinse, 15 mL, Mouth Rinse, Once, Adams, Alston Jerry, MD   lactated ringers  infusion, , Intravenous, Continuous, Welton Hall, Alston Jerry, MD    ALLERGIES   Bee venom, Codeine, Morphine and codeine, Shellfish allergy, Bactrim [sulfamethoxazole-trimethoprim], Norvasc  [amlodipine ], and Nitrofurantoin     REVIEW OF SYSTEMS    Review of Systems:  Gen:  Denies  fever, sweats, chills weigh loss  HEENT: Denies blurred vision, double vision, ear pain, eye pain, hearing loss, nose bleeds, sore throat Cardiac:  No dizziness, chest pain or heaviness, chest tightness,edema Resp:   reports dyspnea chronically  Gi: Denies swallowing difficulty, stomach pain, nausea or vomiting, diarrhea, constipation, bowel incontinence Gu:  Denies bladder incontinence, burning urine Ext:   Denies Joint pain, stiffness or swelling Skin: Denies  skin rash, easy bruising or bleeding or hives Endoc:  Denies polyuria, polydipsia , polyphagia or weight change Psych:   Denies depression, insomnia or hallucinations    Other:  All other systems negative   VS: BP (!) 178/75   Pulse 81   Temp 98.7 F (37.1 C) (Temporal)   Resp 18   Ht 5' (1.524 m)   Wt 39 kg   LMP  (LMP Unknown)   SpO2 96%   BMI 16.79 kg/m      PHYSICAL EXAM    GENERAL:NAD, no fevers, chills, no weakness no fatigue HEAD: Normocephalic, atraumatic.  EYES: Pupils equal, round, reactive to light. Extraocular muscles intact. No scleral icterus.  MOUTH: Moist mucosal membrane. Dentition intact. No abscess noted.  EAR, NOSE, THROAT: Clear without exudates. No external lesions.  NECK: Supple. No thyromegaly. No nodules. No JVD.  PULMONARY: decreased breath sounds with mild rhonchi worse at bases bilaterally.  CARDIOVASCULAR: S1 and S2. Regular rate and rhythm. No murmurs, rubs, or gallops. No edema. Pedal pulses 2+ bilaterally.  GASTROINTESTINAL: Soft, nontender, nondistended. No masses. Positive bowel sounds. No hepatosplenomegaly.  MUSCULOSKELETAL: No swelling, clubbing, or edema. Range of motion full in all extremities.  NEUROLOGIC: Cranial nerves II through XII are intact. No gross focal neurological deficits. Sensation intact. Reflexes intact.  SKIN: No ulceration, lesions, rashes, or cyanosis. Skin warm and dry. Turgor intact.  PSYCHIATRIC: Mood, affect within normal limits. The patient is awake, alert and oriented x 3. Insight, judgment intact.       IMAGING   ow images for NM PET Image Initial (PI) Skull Base To Thigh (F-18 FDG) Study Result  Narrative & Impression  CLINICAL DATA:  Initial treatment strategy for pulmonary nodules.   EXAM: NUCLEAR MEDICINE PET SKULL BASE TO THIGH   TECHNIQUE: 5.5 mCi F-18 FDG was injected intravenously. Full-ring PET imaging was performed from the skull base to thigh after the radiotracer. CT data was obtained and used for attenuation correction and anatomic localization.   Fasting blood glucose: 58 mg/dl   COMPARISON:  Chest CT 01/21/2023   FINDINGS: Mediastinal blood  pool activity: SUV max 1.6   Liver activity: SUV max NA   NECK: No significant abnormal hypermetabolic activity in this region.   Incidental CT findings: Left mastoid effusion.   CHEST: Bilobed nodule in the right upper lobe adjacent to the pleural margin just anterior to the major fissure demonstrates a maximum SUV of 6.8 with hypermetabolic activity measuring about 2.0 by 1.3 cm. This represents substantially higher focal activity than in the rest of the peripheral fibrosis in both lungs, suspicious for malignancy.   Accentuated activity along the left atrial clip, likely  incidental and related to the implant.   Low-grade activity along the peripheral fibrosis in both lungs, likely inflammatory. Aside from the above mention nodules, this is most confluent anteriorly in the left upper lobe or crescentic subpleural confluent reticulation has a maximum SUV of 2.4.   Incidental CT findings: Peripheral fibrosis. Atherosclerosis. Cardiomegaly. Suspected small hiatal hernia.   ABDOMEN/PELVIS: No significant abnormal hypermetabolic activity in this region.   Incidental CT findings: Atherosclerosis is present, including aortoiliac atherosclerotic disease. Hypodense lesion anteriorly in the right hepatic lobe without hypermetabolic activity, likely benign/incidental. Trace ascites. Sigmoid colon diverticulosis.   SKELETON: No significant abnormal hypermetabolic activity in this region.   Incidental CT findings: None.   IMPRESSION: 1. The bilobed nodule in the right upper lobe adjacent to the pleural margin just anterior to the major fissure demonstrates a maximum SUV of 6.8 with hypermetabolic activity measuring about 2.0 by 1.3 cm. This represents substantially higher focal activity than in the rest of the peripheral fibrosis in both lungs, suspicious for malignancy. 2. Low-grade activity along the peripheral fibrosis in both lungs, likely inflammatory. 3. Cardiomegaly. 4.  Trace ascites. 5. Sigmoid colon diverticulosis. 6. Left mastoid effusion. 7. Aortic Atherosclerosis (ICD10-I70.0).     Electronically Signed   By: Freida Jes M.D.   On: 05/17/2023 11:34    ASSESSMENT/PLAN   Right upper lobe hypermetabolic nodule    - plan for lung biopsy with bronchoscopy     - will initially perform airway inspection with flexible bronchoscopy with bronchoalveolar lavage and therapeutic aspiration of tracheobronchial tree. This will be followed by robotic bronchoscopy with transbronchial lung biopsy and endobronchial US  for lymph node staging.    -patient and family present during evaluation and are agreeable to plan wish to proceed.     -Reviewed risks/complications and benefits with patient, risks include infection, pneumothorax/pneumomediastinum which may require chest tube placement as well as overnight/prolonged hospitalization and possible mechanical ventilation. Other risks include bleeding and very rarely death.  Patient understands risks and wishes to proceed.  Additional questions were answered, and patient is aware that post procedure patient will be going home with family and may experience cough with possible clots on expectoration as well as phlegm which may last few days as well as hoarseness of voice post intubation and mechanical ventilation.             Thank you for allowing me to participate in the care of this patient.   Patient/Family are satisfied with care plan and all questions have been answered.    Provider disclosure: Patient with at least one acute or chronic illness or injury that poses a threat to life or bodily function and is being managed actively during this encounter.  All of the below services have been performed independently by signing provider:  review of prior documentation from internal and or external health records.  Review of previous and current lab results.  Interview and comprehensive assessment during  patient visit today. Review of current and previous chest radiographs/CT scans. Discussion of management and test interpretation with health care team and patient/family.   This document was prepared using Dragon voice recognition software and may include unintentional dictation errors.     Tajai Suder, M.D.  Division of Pulmonary & Critical Care Medicine

## 2023-05-24 NOTE — Transfer of Care (Signed)
 Immediate Anesthesia Transfer of Care Note  Patient: Wanda Nelson  Procedure(s) Performed: VIDEO BRONCHOSCOPY WITH ENDOBRONCHIAL NAVIGATION BRONCHOSCOPY, WITH EBUS  Patient Location: PACU  Anesthesia Type:General  Level of Consciousness: awake and drowsy  Airway & Oxygen Therapy: Patient Spontanous Breathing and Patient connected to face mask oxygen  Post-op Assessment: Report given to RN and Post -op Vital signs reviewed and stable  Post vital signs: Reviewed and stable  Last Vitals:  Vitals Value Taken Time  BP 132/54 1620  Temp 35.9 1620  Pulse 55 05/24/23 1621  Resp 26 05/24/23 1621  SpO2 100 % 05/24/23 1621  Vitals shown include unfiled device data.  Last Pain:  Vitals:   05/24/23 1336  TempSrc: Temporal  PainSc: 0-No pain         Complications: No notable events documented.

## 2023-05-24 NOTE — Procedures (Signed)
 ROBOTIC NAVIGATIONAL BRONCHOSCOPY PROCEDURE NOTE  FIBEROPTIC BRONCHOSCOPY WITH THERAPEUTIC ASPIRATION OF TRACHEOBRONCHIAL TREE AND  BRONCHOALVEOLAR LAVAGE PROCEDURE NOTE  TRANSBRONCHIAL LUNG BIOPSY  ENDOBRONCHIAL ULTRASOUND WITH >/= 1 LYMPH NODE PROCEDURE NOTE    Flexible bronchoscopy was performed  by : Jaclynn Mast MD  assistance by : 1)Repiratory therapist  and 2)[employer] staff and 3) Anesthesia team and 4) Flouroscopy team and 5) [employer] supporting staff   Indication for the procedure was :  Pre-procedural H&P. The following assessment was performed on the day of the procedure prior to initiating sedation History:  Chest pain n Dyspnea y Hemoptysis n Cough y Fever n Other pertinent items n  Examination Vital signs -reviewed as per nursing documentation today Cardiac    Murmurs: n  Rubs : n  Gallop: n Lungs Wheezing: n Rales : n Rhonchi :y  Other pertinent findings: SOB/hypoxemia due to chronic lung disease   Pre-procedural assessment for Procedural Sedation included: Depth of sedation: As per anesthesia team  ASA Classification:  2 Mallampati airway assessment: 3    Medication list reviewed: y  The patient's interval history was taken and revealed: no new complaints The pre- procedure physical examination revealed: No new findings Refer to prior clinic note for details.  Informed Consent: Informed consent was obtained from:  patient after explanation of procedure and risks, benefits, as well as alternative procedures available.  Explanation of level of sedation and possible transfusion was also provided.    Procedural Preparation: Time out was performed and patient was identified by name and birthdate and procedure to be performed and side for sampling, if any, was specified. Pt was intubated by anesthesia.  The patient was appropriately draped.   Fiberoptic bronchoscopy with therapeutic aspiration of tracheobronchial tree , airway inspection  and BAL Procedure findings:  Bronchoscope was inserted via ETT  without difficulty.  Posterior oropharynx, epiglottis, arytenoids, false cords and vocal cords were not visualized as these were bypassed by endotracheal tube. The distal trachea was normal in circumference and appearance without mucosal, cartilaginous or branching abnormalities.  The main carina was mildly splayed . All right and left lobar airways were visualized to the Subsegmental level.  Sub- sub segmental carinae were identified in all the distal airways.   Secretions were visible in the following airways and appeared to be clear.  The mucosa was : friable at RUL  Airways were notable for:        exophytic lesions :n       extrinsic compression in the following distributions: n.       Friable mucosa: y       Teacher, music /pigmentation: n     Post procedure Diagnosis:   mucus plugging of right upper lobe  Therapeutic aspiration of tracheobronchial tree was performed bilaterally BAL for microbiology was sent due to mucopurulent return from RUL and mucus plugging   Robotic Navigational Bronchoscopy Procedure Findings:    Post appropriate planning and registration peripheral navigation was used to visualize target lesion.    Transbronchial FNA x 4 -Right upper lobe  Transbronchial surgical pathology right upper lobe x 4 - lesional with possible fungal involvement  Post procedure diagnosis: FUNGAL INFECTION vs CANCER OF RIGHT UPPER LOBE       Endobronchial ultrasound assisted hilar and mediastinal lymph node biopsies procedure findings: The fiberoptic bronchoscope was removed and the EBUS scope was introduced. Examination began to evaluate for pathologically enlarged lymph nodes starting on the LEFT  side progressing to the RIGHT side.  All  lymph node biopsies performed with 21G needle. Lymph node biopsies were sent in cytolite for all stations.  STATION 4L - NOT BIOPSIED STATION 10L - NOT  BIOPSIED STATION 7 - 1.2CM BIOPSIED 3 TIMES  STATION 10R - NOT BIOPSIED     Post procedure diagnosis:  SUBCARINAL ADENOPATHY   Immediate sampling complications included:NONE  Epinephrine  ZERO ml was used topically  The bronchoscopy was terminated due to completion of the planned procedure and the bronchoscope was removed.   Total dosage of Lidocaine  was ZERO mg Total fluoroscopy time was AS PER RADIOLOGY  minutes  Supplemental oxygen was provided at AS PER ANESTHESIA lpm by nasal canula post operatively  Estimated Blood loss: EXPECTED <1 cc.  Complications included:  NONE   Preliminary CXR findings :  IN PROCESS   Disposition: HOME WITH FAMILY   Follow up with Dr. Evian Derringer in 5 days for result discussion.     Erskin Hearing MD  [facility] Division of Pulmonary & Critical Care Medicine

## 2023-05-25 ENCOUNTER — Encounter: Payer: Self-pay | Admitting: Pulmonary Disease

## 2023-05-27 ENCOUNTER — Other Ambulatory Visit: Payer: Self-pay | Admitting: Pulmonary Disease

## 2023-05-27 DIAGNOSIS — R911 Solitary pulmonary nodule: Secondary | ICD-10-CM

## 2023-05-28 ENCOUNTER — Encounter (INDEPENDENT_AMBULATORY_CARE_PROVIDER_SITE_OTHER): Payer: Self-pay

## 2023-05-29 LAB — CYTOLOGY - NON PAP

## 2023-05-29 LAB — SURGICAL PATHOLOGY

## 2023-06-05 DIAGNOSIS — J8489 Other specified interstitial pulmonary diseases: Secondary | ICD-10-CM | POA: Diagnosis not present

## 2023-06-05 DIAGNOSIS — K529 Noninfective gastroenteritis and colitis, unspecified: Secondary | ICD-10-CM | POA: Diagnosis not present

## 2023-06-05 DIAGNOSIS — J8410 Pulmonary fibrosis, unspecified: Secondary | ICD-10-CM | POA: Diagnosis not present

## 2023-06-13 DIAGNOSIS — H0288B Meibomian gland dysfunction left eye, upper and lower eyelids: Secondary | ICD-10-CM | POA: Diagnosis not present

## 2023-06-13 DIAGNOSIS — H02886 Meibomian gland dysfunction of left eye, unspecified eyelid: Secondary | ICD-10-CM | POA: Diagnosis not present

## 2023-06-13 DIAGNOSIS — H16223 Keratoconjunctivitis sicca, not specified as Sjogren's, bilateral: Secondary | ICD-10-CM | POA: Diagnosis not present

## 2023-06-13 DIAGNOSIS — H0288A Meibomian gland dysfunction right eye, upper and lower eyelids: Secondary | ICD-10-CM | POA: Diagnosis not present

## 2023-06-13 DIAGNOSIS — H02883 Meibomian gland dysfunction of right eye, unspecified eyelid: Secondary | ICD-10-CM | POA: Diagnosis not present

## 2023-06-13 DIAGNOSIS — H401112 Primary open-angle glaucoma, right eye, moderate stage: Secondary | ICD-10-CM | POA: Diagnosis not present

## 2023-06-13 DIAGNOSIS — Z961 Presence of intraocular lens: Secondary | ICD-10-CM | POA: Diagnosis not present

## 2023-06-13 DIAGNOSIS — H401123 Primary open-angle glaucoma, left eye, severe stage: Secondary | ICD-10-CM | POA: Diagnosis not present

## 2023-06-14 LAB — CULTURE, FUNGUS WITHOUT SMEAR

## 2023-06-25 ENCOUNTER — Ambulatory Visit: Admitting: Family Medicine

## 2023-06-26 ENCOUNTER — Ambulatory Visit: Payer: Self-pay | Admitting: *Deleted

## 2023-06-26 ENCOUNTER — Telehealth: Payer: Self-pay | Admitting: Family Medicine

## 2023-06-26 NOTE — Telephone Encounter (Signed)
 Left a message on both home and cell phone:  Your appointment for 06/27/2023 has been canceled. Dr Sueanne Emerald will no longer be seeing patient's. Please call the office to reschedule this appointment with either Dr Narendra or Marny Sires, NP.  Thank you.  E2C2 please reschedule with one of the providers above.

## 2023-06-26 NOTE — Telephone Encounter (Signed)
      FYI Only or Action Required?: Action required by provider  Patient was last seen in primary care on 05/01/2023 by Valli Gaw, MD. Called Nurse Triage reporting Hypertension. Symptoms began several days ago. Interventions attempted: Nothing. Symptoms are: gradually worsening.  Triage Disposition: See PCP Within 2 Weeks  Patient/caregiver understands and will follow disposition?: No, wishes to speak with PCP                    Copied from CRM [id number]. Topic: Clinical - Red Word Triage >> Jun 26, 2023 11:30 AM Taleah C wrote: Red Word that prompted transfer to Nurse Triage: high blood pressure in the 150s & 160s. For several weeks. Reason for Disposition  [1] Systolic BP  >= 130 OR Diastolic >= 80 AND [2] taking BP medications  Answer Assessment - Initial Assessment Questions 1. BLOOD PRESSURE: What is the blood pressure? Did you take at least two measurements 5 minutes apart?     BP 134/72 Pulse 85 rechecked BP 148/83 Pulse 81 2. ONSET: When did you take your blood pressure?     Now  3. HOW: How did you take your blood pressure? (e.g., automatic home BP monitor, visiting nurse)     Automatic home BP monitor 4. HISTORY: Do you have a history of high blood pressure?     Yes  5. MEDICINES: Are you taking any medicines for blood pressure? Have you missed any doses recently?     Yes takes 1 tablet in am and 1 tablet in pm losartan   6. OTHER SYMPTOMS: Do you have any symptoms? (e.g., blurred vision, chest pain, difficulty breathing, headache, weakness)     No sx  7. PREGNANCY: Is there any chance you are pregnant? When was your last menstrual period?     Na  Patient requesting PCP to advise regarding if she needs to increase BP medication and if PCP will prescribe cipro  for prophylaxis prior to going on 2 weeks trip/ cruise. Patient reports she was contacted today appt for tomorrow with PCP cancelled and now unable to get another appt prior to  going on trip. Unable to come to earliest appt June 23 due to another GI appt scheduled. Patient requesting if any cancellations this week she lives close by. Reports BP has been running 150's/80's pulse in 60's . Asymptomatic. Patient requesting call back. Leaves for vacation June 26.  Would like to know if BP meds need increasing before refilling prescription for losartan  .  Protocols used: Blood Pressure - High-A-AH

## 2023-06-27 ENCOUNTER — Ambulatory Visit: Admitting: Family Medicine

## 2023-06-27 ENCOUNTER — Other Ambulatory Visit: Payer: Self-pay | Admitting: Family Medicine

## 2023-06-27 ENCOUNTER — Ambulatory Visit: Payer: Medicare Other | Admitting: Family Medicine

## 2023-06-27 DIAGNOSIS — E785 Hyperlipidemia, unspecified: Secondary | ICD-10-CM

## 2023-06-27 DIAGNOSIS — I10 Essential (primary) hypertension: Secondary | ICD-10-CM

## 2023-06-27 DIAGNOSIS — K219 Gastro-esophageal reflux disease without esophagitis: Secondary | ICD-10-CM

## 2023-06-27 DIAGNOSIS — J309 Allergic rhinitis, unspecified: Secondary | ICD-10-CM

## 2023-06-27 MED ORDER — LORATADINE 10 MG PO TABS
10.0000 mg | ORAL_TABLET | Freq: Every evening | ORAL | 3 refills | Status: DC | PRN
Start: 2023-06-27 — End: 2023-08-09

## 2023-06-27 MED ORDER — ATORVASTATIN CALCIUM 20 MG PO TABS: 20.0000 mg | ORAL_TABLET | Freq: Every day | ORAL | 3 refills | Status: DC

## 2023-06-27 MED ORDER — LOSARTAN POTASSIUM 25 MG PO TABS: 25.0000 mg | ORAL_TABLET | Freq: Two times a day (BID) | ORAL | 3 refills | Status: DC

## 2023-06-27 MED ORDER — PANTOPRAZOLE SODIUM 40 MG PO TBEC: 40.0000 mg | DELAYED_RELEASE_TABLET | Freq: Two times a day (BID) | ORAL | 0 refills | Status: DC

## 2023-06-28 NOTE — Telephone Encounter (Signed)
 Last read by Valery Gaucher at 9:30AM on 06/28/2023.

## 2023-07-01 DIAGNOSIS — K582 Mixed irritable bowel syndrome: Secondary | ICD-10-CM | POA: Diagnosis not present

## 2023-07-02 ENCOUNTER — Ambulatory Visit (INDEPENDENT_AMBULATORY_CARE_PROVIDER_SITE_OTHER)

## 2023-07-02 VITALS — BP 130/80 | HR 57 | Temp 98.0°F | Ht 61.0 in | Wt 93.8 lb

## 2023-07-02 DIAGNOSIS — R911 Solitary pulmonary nodule: Secondary | ICD-10-CM | POA: Diagnosis not present

## 2023-07-02 DIAGNOSIS — I10 Essential (primary) hypertension: Secondary | ICD-10-CM

## 2023-07-02 DIAGNOSIS — R634 Abnormal weight loss: Secondary | ICD-10-CM

## 2023-07-02 DIAGNOSIS — I4891 Unspecified atrial fibrillation: Secondary | ICD-10-CM | POA: Insufficient documentation

## 2023-07-02 DIAGNOSIS — Z792 Long term (current) use of antibiotics: Secondary | ICD-10-CM | POA: Diagnosis not present

## 2023-07-02 MED ORDER — CIPROFLOXACIN HCL 250 MG PO TABS: 250.0000 mg | ORAL_TABLET | Freq: Two times a day (BID) | ORAL | 0 refills | Status: DC

## 2023-07-02 MED ORDER — CIPROFLOXACIN HCL 250 MG PO TABS: 250.0000 mg | ORAL_TABLET | Freq: Two times a day (BID) | ORAL | 0 refills | Status: AC

## 2023-07-02 NOTE — Assessment & Plan Note (Signed)
 Continue Azithromycin  250 mg, three times a day and Dexamethasone  1 mg daily as recommended by her pulmonologist. I do not recommend adding more antibiotic for potential risk of bronchitis at this time. To reduce the risk of pulmonary infections recommend: - practice regular hand hygiene,  - avoid close contact with sick individuals,  - wear masks in crowded areas, stay hydrated.

## 2023-07-02 NOTE — Assessment & Plan Note (Signed)
 Improving. Compared weight from 05/24/23 (was 85 pounds) improved today (is 93 pounds).  Established with GI Dr. Therisa. Recent CMP normal.  Continue to monitor, eat nutrient rich food.

## 2023-07-02 NOTE — Progress Notes (Signed)
 Acute Office Visit  Subjective:    Patient ID: Wanda Nelson, female    DOB: 1931-04-08, 89 y.o.   MRN: [id number]  Chief Complaint  Patient presents with   Hypertension   Patient is in today for following concerns:  Patient is going on a 1 weeks cruise to [place] and wanted to discuss hypertension, UTI prophylaxis before her travel.   HPI 1) Hypertension:  She is taking Losartan  25 mg twice a day.  Home BP cuff calibrated in the clinic. Home cuff showing SBP 14-15 mmHg higher compared to office BP cuff.  No headache, vision change, lower leg swelling.  She is active, walks 2 miles everyday.  She has been treated with Hydrochlorothiazide  in the past but therapy changed due to hyponatremia. 05/20/23 Na normalized.    2) Requesting UTI prophylaxis: Patient reports she tends to get UTI like symptoms when she travels. She is requesting Prophylactic Ciprofloxacin . Last UTI was in 04/11/2022 positive for Proteus Mirabilis and was treated with Cefdinir  300 mg BID for 5 days. Patient is currently not having urinary symptoms suggestive or concerning for UTI.   Following were also discussed during today's visit:  3) Right upper lobe hypermetabolic mass: Established with [facility] pulmonology. On Dexamethasone  1 mg daily and Azithromycin  250 mg three days a week for 3 months per Pulmunology. Since being on antibiotic/steroid she feels better, has gained energy, gained weight and denies cough. She also has history of Pulmonary fibrosis. Reports she cannot tolerate using inhaler. She has been using daily Albuterol  nebulizer.  She also takes Loratadine  10 mg daily.   Patient also reports that she gets bronchitis when she travels and is requesting antibiotic prophylaxis for her upcoming travel.   4) GERD: Protonix  40 mg twice a day. Symptoms has been well controlled.   5) She takes Tylenol  every night to help with morning stiffness and joint pain. She takes 2 tablets of 325 mg.   7)  Hyperlipidemia:  On Lipitor 20 mg daily.   8) IBS (intermittent constipation/diarrhea), unintentional weight loss:  She saw GI Dr. Therisa on 6/23 and has her started on Miralax .  Weight, appetite improving.   9) Patient was found to have mildly elevated T3 at 37 in 05/01/23. Her TSH, T4 was within normal range. She also had low normal iron, low iron saturation in 05/01/23.   ROS As per HPI    Objective:    BP 130/80 (BP Location: Right Arm, Cuff Size: Small)   Pulse (!) 57   Temp 98 F (36.7 C) (Oral)   Ht 5' 1 (1.549 m)   Wt 93 lb 12.8 oz (42.5 kg)   LMP  (LMP Unknown)   SpO2 97%   BMI 17.72 kg/m    Physical Exam Constitutional:      General: She is not in acute distress.    Appearance: She is underweight. She is not ill-appearing.  HENT:     Head: Normocephalic and atraumatic.     Nose: No congestion.     Mouth/Throat:     Mouth: Mucous membranes are moist.  Neck:     Thyroid : No thyroid  mass or thyroid  tenderness.   Cardiovascular:     Rate and Rhythm: Normal rate and regular rhythm.  Pulmonary:     Effort: Pulmonary effort is normal.     Breath sounds: Normal breath sounds. No wheezing or rales.  Abdominal:     General: Bowel sounds are normal.     Palpations: Abdomen is  soft.     Tenderness: There is no guarding.   Musculoskeletal:     Cervical back: Neck supple. No rigidity.     Right lower leg: No edema.     Left lower leg: No edema.  Lymphadenopathy:     Cervical: No cervical adenopathy.   Skin:    General: Skin is warm.   Neurological:     Mental Status: She is alert and oriented to person, place, and time.     Comments: Slow, antalgic gait.   Psychiatric:        Mood and Affect: Mood normal.        Behavior: Behavior normal.     No results found for any visits on 07/02/23.     Assessment & Plan:  Prophylactic antibiotic Assessment & Plan: During today's visit, the patient requested prophylactic antibiotics for a potential urinary tract  infection as she will be traveling out of the country, specifically asking for Ciprofloxacin  despite being asymptomatic. I advised against the use of prophylactic antibiotics in asymptomatic patients, explaining that it is not generally recommended.  We discussed the potential side effects of Ciprofloxacin , including an increased risk of Clostridioides difficile infection, the risk of tendinopathy or tendon rupture, and potential renal impairment.  I reviewed CMP from 05/20/23, which was normal.  She verbalized understanding of the potential side effects and agreed not to take the antibiotic unless necessary. She also committed to seeking medical care if symptoms of a UTI occur while traveling.  Additionally, I recommended that she take a daily probiotic to help reduce antibiotic-related side effects.   Orders: -     Ciprofloxacin  HCl; Take 1 tablet (250 mg total) by mouth 2 (two) times daily for 3 days.  Dispense: 6 tablet; Refill: 0  Essential hypertension Assessment & Plan: Blood pressure  within goal today. Continue Losartan  50 mg daily (2 tab of 25 mg). Offered sending new prescription for Losartan  50 mg to take one tab a day patient declined. Her home BP cuff is not accurate when calibrated in the clinic today.  Recommend checking home BP, three times a week with new BP cuff and follow up in August with home BP reading.     Lung nodule Assessment & Plan: Continue Azithromycin  250 mg, three times a day and Dexamethasone  1 mg daily as recommended by her pulmonologist. I do not recommend adding more antibiotic for potential risk of bronchitis at this time. To reduce the risk of pulmonary infections recommend: - practice regular hand hygiene,  - avoid close contact with sick individuals,  - wear masks in crowded areas, stay hydrated.    Weight loss, non-intentional Assessment & Plan: Improving. Compared weight from 05/24/23 (was 85 pounds) improved today (is 93 pounds).  Established with GI  Dr. Therisa. Recent CMP normal.  Continue to monitor, eat nutrient rich food.      Has appointment with me on 08/14/23. Will review home BP reading, weight and obtain following labs during her upcoming appointment:  - Repeat TSH, T3, T4 ( mildly elevated T3 on 05/01/23) - Repeat Iron panel ( low iron saturation on 05/01/23) - Check A1c (low capillary blood glucose on 05/17/23)  - Check B 12 (mildly elevated on 05/01/23)   I spent 40 minutes on the day of this face-to-face encounter reviewing the patient's medical and surgical history, medications, ongoing concerns, and reviewing the assessment and plan with the patient. This time also included counseling the patient on their health conditions and management options.  Luke Shade, MD

## 2023-07-02 NOTE — Assessment & Plan Note (Signed)
 During today's visit, the patient requested prophylactic antibiotics for a potential urinary tract infection as she will be traveling out of the country, specifically asking for Ciprofloxacin  despite being asymptomatic. I advised against the use of prophylactic antibiotics in asymptomatic patients, explaining that it is not generally recommended.  We discussed the potential side effects of Ciprofloxacin , including an increased risk of Clostridioides difficile infection, the risk of tendinopathy or tendon rupture, and potential renal impairment.  I reviewed CMP from 05/20/23, which was normal.  She verbalized understanding of the potential side effects and agreed not to take the antibiotic unless necessary. She also committed to seeking medical care if symptoms of a UTI occur while traveling.  Additionally, I recommended that she take a daily probiotic to help reduce antibiotic-related side effects.

## 2023-07-02 NOTE — Assessment & Plan Note (Signed)
 Blood pressure  within goal today. Continue Losartan  50 mg daily (2 tab of 25 mg). Offered sending new prescription for Losartan  50 mg to take one tab a day patient declined. Her home BP cuff is not accurate when calibrated in the clinic today.  Recommend checking home BP, three times a week with new BP cuff and follow up in August with home BP reading.

## 2023-07-02 NOTE — Patient Instructions (Addendum)
 I am sending prophylactic antibiotic incase you were to develop urinary tract infection symptoms like increased urination, burning when you pee. You can take Ciprofloxacin  250 mg, twice a day for 3 days. You should seek medical care though if you do not feel better. Please do not take antibiotic unless you develop symptoms. Take probiotic 2 hours before or two hours after taking antibiotic. Please practice caution when traveling with wearing mask, frequent hand washing.    Continue to check your blood pressure, three times a week with new BP cuff. Please bring your BP reading during your appointment with me in August. Continue to take Losartan  25 mg (2 tab) once a day.

## 2023-07-23 ENCOUNTER — Encounter: Payer: Self-pay | Admitting: Cardiovascular Disease

## 2023-07-23 ENCOUNTER — Ambulatory Visit: Attending: Cardiovascular Disease | Admitting: Cardiovascular Disease

## 2023-07-23 VITALS — BP 138/70 | HR 75 | Ht 61.0 in | Wt 97.4 lb

## 2023-07-23 DIAGNOSIS — I9789 Other postprocedural complications and disorders of the circulatory system, not elsewhere classified: Secondary | ICD-10-CM | POA: Diagnosis not present

## 2023-07-23 DIAGNOSIS — D151 Benign neoplasm of heart: Secondary | ICD-10-CM | POA: Diagnosis not present

## 2023-07-23 DIAGNOSIS — R001 Bradycardia, unspecified: Secondary | ICD-10-CM | POA: Diagnosis present

## 2023-07-23 DIAGNOSIS — I4891 Unspecified atrial fibrillation: Secondary | ICD-10-CM | POA: Insufficient documentation

## 2023-07-23 DIAGNOSIS — I10 Essential (primary) hypertension: Secondary | ICD-10-CM | POA: Insufficient documentation

## 2023-07-23 DIAGNOSIS — E785 Hyperlipidemia, unspecified: Secondary | ICD-10-CM | POA: Diagnosis not present

## 2023-07-23 NOTE — Progress Notes (Signed)
 Cardiology Office Note   Date:  07/23/2023   ID:  Wanda Nelson, DOB Dec 17, 1931, MRN [id number]  PCP:  Abbey Bruckner, MD  Cardiologist:   Deatrice Cage, MD   No chief complaint on file.      History of Present Illness: Wanda Nelson is a 89 y.o. female who presents for a follow-up visit post atrial myxoma resection. She has known history of anemia, hiatal hernia, hyperlipidemia and remote history of hemorrhagic stroke.  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  Cardiac catheterization before surgery showed no evidence of obstructive coronary artery disease.  She had intermittent palpitations and tachycardia shortly after surgery due to short runs of nonsustained ventricular tachycardia and intermittent A. fib with RVR. She did not require treatment for this and her symptoms resolved without intervention.  Echocardiogram in December 2020 showed normal LV systolic function with no significant valvular abnormalities.  There was a concern about possible recurrent atrial mass.  However, it appeared to be prominent scarring at the surgical site.  Most recent echocardiogram in June 2021showed no evidence of mass.  EF was 50 to 55%.  He had COVID-19 infection in May of 2022 after a trip to [place] .  She she had significant shortness of breath but ultimately recovered.    She was hospitalized in 2023 for shortness of breath. She underwent an echocardiogram which showed normal LV systolic function with mild LVH and stable mild to moderate tricuspid regurgitation.  CT of the chest showed evidence of pulmonary fibrosis.  I felt that her dyspnea was noncardiac.  She was evaluated by pulmonary and dyspnea was felt to be due to pulmonary fibrosis from large lateral hernia causing chronic reflux.    She underwent hiatal hernia surgery in November, 2023.    She has been doing reasonably well but continues to have  shortness of breath related to her lung disease.  She had a suspicious lung mass and underwent biopsy which showed no cancer.  She denies chest pain or palpitations.  Past Medical History:  Diagnosis Date   Actinic keratosis    Acute diverticulitis 08/17/2022   Anemia    Aortic atherosclerosis (HCC)    Atrial myxoma 03/17/2018   a.) TTE 03/17/2018 --> 2.4 x 2.5 cm LEFT atrial mass attached at fossa ovalis/atrial septum; pedunculated in shape and solid in texture; b.) s/p atrial myoxma resection 03/21/2018; initially attempted via minimally invasive RIGHT thoracotomy approach, however due to bleeding complications, procedure required median sternotomy and patient going on cardiopulmonary bypass   Basal cell carcinoma 05/17/2021   L upper back 5.0 cm lat to spine - ED&C   Cardiomegaly    Complication of anesthesia    a.) severe PONV   Diastolic dysfunction    a.) TTE 03/17/2018: EF 55-60%, LVH, mild PR, G1DD; b.) TTE 07/25/2018: EF 60-65%, RVSP 44.9, mild-mod MR/TR, G1DD; c.) TTE 12/18/2018: EF 60-65%; d.) TTE 06/12/2019: EF 50-55, mild BAE, triv TR, PASP 37.5, G2DD; e.) TTE 07/22/2020: EF 60-65%, mild MR, mod TR, RVSP 42.7, G1DD; f.) TTE 05/08/2021: EF 60-65%, mild LVH, mild-mod TR, AoV sclerosis without stenosis   Diverticulosis    Dyspnea    Essential hypertension    Fatigue 12/19/2021   GERD (gastroesophageal reflux disease)    Glaucoma    Hemorrhagic cerebrovascular accident (CVA) (HCC) 1990   Hiatal hernia s/p repair 11/2021    History of 2019 novel coronavirus disease (COVID-19)  a.) 05/30/20 - Tx'd with covid 19 pill; b.) home Ag testing (+) 10/2021   History of blood transfusion    History of left atrial appendage closure 03/21/2018   a.) #45 Atricure   Hyperlipidemia    Hypoxia    Incidental pulmonary nodule, > 3mm and < 8mm 03/20/2018   Noted on CT scan - needs f/u imaging 6 months   Long-term use of aspirin  therapy    Lower GI bleed 08/17/2022   Lung fibrosis (HCC)  11/16/2020   Nodule of upper lobe of right lung    NSVT (nonsustained ventricular tachycardia) (HCC) 05/13/2018   a.) holter 05/13/2018 --> 5 short runs NSVT   PAF (paroxysmal atrial fibrillation) (HCC) 05/13/2018   a.) holter 05/13/2018 --> intermittent episodes with longest lasting approx 1.5 hours (burden <1%); b.) CHA2DS2VASc = 7 (age x2, sex, HTN, CVA x2, vascular disease); c.) s/p LAA closure 03/21/2018; d.) rate/rhythm now maintained intrinsically without pharmacological intervention; no OAC   Pain due to onychomycosis of toenails of both feet 07/28/2020   Paraesophageal hernia    PONV (postoperative nausea and vomiting)    PSVT (paroxysmal supraventricular tachycardia) (HCC) 05/13/2018   a.) holter 05/13/2018 --> frequent runs of SVT noted during study   Pulmonary fibrosis (HCC)    Pulmonary hypertension (HCC)    S/P repair of paraesophageal hernia 11/14/2021   Sinus bradycardia    Squamous cell carcinoma of skin 11/12/2019   L distal dorsum forearm - ED&C    Vitamin D  deficiency 01/02/2023    Past Surgical History:  Procedure Laterality Date   BUNIONECTOMY Bilateral    CLIPPING OF ATRIAL APPENDAGE  03/21/2018   Procedure: Clipping Of Atrial Appendage - using an AtriCure 45mm clip;  Surgeon: Dusty Sudie DEL, MD;  Location: [facility] OR;  Service: Open Heart Surgery;;   COLONOSCOPY WITH PROPOFOL  N/A 10/23/2022   Procedure: COLONOSCOPY WITH PROPOFOL ;  Surgeon: Therisa Bi, MD;  Location: [facility] ENDOSCOPY;  Service: Gastroenterology;  Laterality: N/A;   ESOPHAGOGASTRODUODENOSCOPY (EGD) WITH PROPOFOL  N/A 09/06/2021   Procedure: ESOPHAGOGASTRODUODENOSCOPY (EGD) WITH PROPOFOL ;  Surgeon: Therisa Bi, MD;  Location: [facility] ENDOSCOPY;  Service: Gastroenterology;  Laterality: N/A;   EXCISION OF ATRIAL MYXOMA N/A 03/21/2018   Procedure: Excision Of Atrial Myxoma;  Surgeon: Dusty Sudie DEL, MD;  Location: [facility] OR;  Service: Open Heart Surgery;  Laterality: N/A;   GLAUCOMA SURGERY Left 11/17/2013    RECTOCELE REPAIR     RIGHT/LEFT HEART CATH AND CORONARY ANGIOGRAPHY N/A 03/19/2018   Procedure: RIGHT/LEFT HEART CATH AND CORONARY ANGIOGRAPHY;  Surgeon: Darron Deatrice LABOR, MD;  Location: [facility] INVASIVE CV LAB;  Service: Cardiovascular;  Laterality: N/A;   TEE WITHOUT CARDIOVERSION N/A 03/21/2018   Procedure: TRANSESOPHAGEAL ECHOCARDIOGRAM (TEE);  Surgeon: Dusty Sudie DEL, MD;  Location: [facility] OR;  Service: Open Heart Surgery;  Laterality: N/A;   TONSILLECTOMY     TOTAL ABDOMINAL HYSTERECTOMY  01/09/1980   TRABECULECTOMY Right 07/06/2015   VIDEO BRONCHOSCOPY WITH ENDOBRONCHIAL NAVIGATION N/A 05/24/2023   Procedure: VIDEO BRONCHOSCOPY WITH ENDOBRONCHIAL NAVIGATION;  Surgeon: Parris Manna, MD;  Location: [facility] ORS;  Service: Thoracic;  Laterality: N/A;   VIDEO BRONCHOSCOPY WITH ENDOBRONCHIAL ULTRASOUND N/A 05/24/2023   Procedure: BRONCHOSCOPY, WITH EBUS;  Surgeon: Parris Manna, MD;  Location: [facility] ORS;  Service: Thoracic;  Laterality: N/A;   VISCERAL ANGIOGRAPHY N/A 08/17/2022   Procedure: VISCERAL ANGIOGRAPHY;  Surgeon: Marea Selinda RAMAN, MD;  Location: [facility] INVASIVE CV LAB;  Service: Cardiovascular;  Laterality: N/A;   XI ROBOTIC ASSISTED PARAESOPHAGEAL HERNIA REPAIR N/A  11/14/2021   Procedure: XI ROBOTIC ASSISTED PARAESOPHAGEAL HERNIA REPAIR, RNFA to assist;  Surgeon: Jordis Laneta FALCON, MD;  Location: [facility] ORS;  Service: General;  Laterality: N/A;     Current Outpatient Medications  Medication Sig Dispense Refill   acetaminophen  (TYLENOL ) 325 MG tablet Take 2 tablets (650 mg total) by mouth every 6 (six) hours as needed for mild pain (or Fever >/= 101).     albuterol  (PROVENTIL ) (2.5 MG/3ML) 0.083% nebulizer solution Inhale 2.5 mg into the lungs daily.     Ascorbic Acid  (VITAMIN C  PO) Take by mouth daily at 12 noon.     aspirin  EC 81 MG tablet Take 1 tablet (81 mg total) by mouth daily. 90 tablet 3   atorvastatin  (LIPITOR) 20 MG tablet Take 1 tablet (20 mg total) by mouth at bedtime. 90 tablet 3    Calcium  Carbonate-Vitamin D  500-125 MG-UNIT TABS Take 1 tablet by mouth daily.     cholecalciferol  (VITAMIN D ) 1000 units tablet Take 1,000 Units by mouth daily.     loratadine  (ALLERGY RELIEF) 10 MG tablet Take 1 tablet (10 mg total) by mouth at bedtime as needed. 90 tablet 3   losartan  (COZAAR ) 25 MG tablet Take 1 tablet (25 mg total) by mouth 2 (two) times daily. 180 tablet 3   Multiple Vitamin (MULTIVITAMIN) tablet Take 1 tablet by mouth 3 (three) times a week.     pantoprazole  (PROTONIX ) 40 MG tablet Take 1 tablet (40 mg total) by mouth 2 (two) times daily. 180 tablet 0   No current facility-administered medications for this visit.    Allergies:   Bee venom, Codeine, Morphine and codeine, Shellfish allergy, Bactrim [sulfamethoxazole-trimethoprim], Norvasc  [amlodipine ], and Nitrofurantoin    Social History:  The patient  reports that she has never smoked. She has never used smokeless tobacco. She reports that she does not drink alcohol and does not use drugs.   Family History:  The patient's family history includes Cancer in her brother; Colon cancer in her brother, maternal grandmother, and maternal uncle; Coronary artery disease in her brother; Esophageal varices in her brother; Kidney disease in her brother.    ROS:  Please see the history of present illness.   Otherwise, review of systems are positive for none.   All other systems are reviewed and negative.    PHYSICAL EXAM: VS:  LMP  (LMP Unknown)  , BMI There is no height or weight on file to calculate BMI. GEN: Well nourished, well developed, in no acute distress  HEENT: normal  Neck: no JVD, carotid bruits, or masses Cardiac: RRR; no murmurs, rubs, or gallops,no edema  Respiratory:  clear to auscultation bilaterally, normal work of breathing GI: soft, nontender, nondistended, + BS MS: no deformity or atrophy  Skin: warm and dry, no rash Neuro:  Strength and sensation are intact Psych: euthymic mood, full affect   EKG:   EKG is ordered today. The ekg ordered today demonstrates : Normal sinus rhythm with PACs When compared with ECG of 17-Aug-2022 06:46, Minimal criteria for Anterior infarct are no longer Present Nonspecific T wave abnormality no longer evident in Inferior leads Nonspecific T wave abnormality, improved in Anterior leads QT has shortened      Recent Labs: 05/01/2023: ALT 12; BUN 12; Creatinine, Ser 0.72; Hemoglobin 14.1; Platelets 155.0; Potassium 4.1 Hemolysis seen...; Sodium 130; TSH 1.37    Lipid Panel    Component Value Date/Time   CHOL 167 05/16/2021 1042   TRIG 113.0 05/16/2021 1042   HDL  59.80 05/16/2021 1042   CHOLHDL 3 05/16/2021 1042   VLDL 22.6 05/16/2021 1042   LDLCALC 84 05/16/2021 1042      Wt Readings from Last 3 Encounters:  07/02/23 93 lb 12.8 oz (42.5 kg)  05/24/23 85 lb 15.7 oz (39 kg)  05/24/23 86 lb (39 kg)          No data to display            ASSESSMENT AND PLAN:  1.  Status post surgical resection of atrial myxoma: No evidence of recurrent myxoma on most recent echocardiogram in 2023.  Will consider repeat echocardiogram next year.  2.  Postoperative atrial fibrillation: No evidence of recurrent arrhythmia.  She is in sinus rhythm today.  3.  Hyperlipidemia: She is currently on atorvastatin  20 mg daily.  Most recent lipid profile showed an LDL of 84.  4. Essential hypertension: Her blood pressure is controlled today but she reports significant fluctuations at home.  Continue losartan  25 mg twice daily.    Disposition:   FU with me in 6 months  Signed,  Deatrice Cage, MD  07/23/2023 1:59 PM    [facility]

## 2023-07-23 NOTE — Patient Instructions (Signed)

## 2023-07-24 ENCOUNTER — Ambulatory Visit: Admitting: *Deleted

## 2023-07-24 ENCOUNTER — Ambulatory Visit

## 2023-07-24 VITALS — Ht 61.0 in | Wt 92.0 lb

## 2023-07-24 DIAGNOSIS — Z0000 Encounter for general adult medical examination without abnormal findings: Secondary | ICD-10-CM

## 2023-07-24 NOTE — Progress Notes (Signed)
 Subjective:   Wanda Nelson is a 89 y.o. who presents for a Medicare Wellness preventive visit.  As a reminder, Annual Wellness Visits don't include a physical exam, and some assessments may be limited, especially if this visit is performed virtually. We may recommend an in-person follow-up visit with your provider if needed.  Visit Complete: Virtual I connected with  [name] on 07/24/23 by a audio enabled telemedicine application and verified that I am speaking with the correct person using two identifiers.  Patient Location: Home  Provider Location: Home Office  I discussed the limitations of evaluation and management by telemedicine. The patient expressed understanding and agreed to proceed.  Vital Signs: Because this visit was a virtual/telehealth visit, some criteria may be missing or patient reported. Any vitals not documented were not able to be obtained and vitals that have been documented are patient reported.  VideoDeclined- This patient declined Librarian, academic. Therefore the visit was completed with audio only.  Persons Participating in Visit: Patient.  AWV Questionnaire: No: Patient Medicare AWV questionnaire was not completed prior to this visit.  Cardiac Risk Factors include: advanced age (>31men, >24 women);dyslipidemia;hypertension     Objective:    Today's Vitals   07/24/23 1054  Weight: 92 lb (41.7 kg)  Height: 5' 1 (1.549 m)   Body mass index is 17.38 kg/m.     07/24/2023   11:07 AM 05/24/2023    1:35 PM 05/22/2023    3:24 PM 08/17/2022   10:01 AM 08/17/2022    6:42 AM 05/17/2022    1:07 PM 03/01/2022    9:46 AM  Advanced Directives  Does Patient Have a Medical Advance Directive? Yes Yes Yes No No No No  Type of Estate agent of [place];Living will Healthcare Power of [place];Living will Healthcare Power of [place];Living will      Does patient want to make changes to medical  advance directive? No - Patient declined        Copy of Healthcare Power of Attorney in Chart? Yes - validated most recent copy scanned in chart (See row information) Yes - validated most recent copy scanned in chart (See row information) Yes - validated most recent copy scanned in chart (See row information)      Would patient like information on creating a medical advance directive?    No - Patient declined  Yes (MAU/Ambulatory/Procedural Areas - Information given) No - Patient declined    Current Medications (verified) Outpatient Encounter Medications as of 07/24/2023  Medication Sig   acetaminophen  (TYLENOL ) 325 MG tablet Take 2 tablets (650 mg total) by mouth every 6 (six) hours as needed for mild pain (or Fever >/= 101).   albuterol  (PROVENTIL ) (2.5 MG/3ML) 0.083% nebulizer solution Inhale 2.5 mg into the lungs daily.   Ascorbic Acid  (VITAMIN C  PO) Take by mouth daily at 12 noon.   aspirin  EC 81 MG tablet Take 1 tablet (81 mg total) by mouth daily.   atorvastatin  (LIPITOR) 20 MG tablet Take 1 tablet (20 mg total) by mouth at bedtime.   azithromycin  (ZITHROMAX ) 250 MG tablet Take 250 mg by mouth 3 (three) times a week.   Calcium  Carbonate-Vitamin D  500-125 MG-UNIT TABS Take 1 tablet by mouth daily.   cholecalciferol  (VITAMIN D ) 1000 units tablet Take 1,000 Units by mouth daily.   dexamethasone  (DECADRON ) 1 MG tablet Take 1 mg by mouth daily with breakfast.   loratadine  (ALLERGY RELIEF) 10 MG tablet Take 1 tablet (10  mg total) by mouth at bedtime as needed.   losartan  (COZAAR ) 25 MG tablet Take 1 tablet (25 mg total) by mouth 2 (two) times daily.   Multiple Vitamin (MULTIVITAMIN) tablet Take 1 tablet by mouth 3 (three) times a week.   pantoprazole  (PROTONIX ) 40 MG tablet Take 1 tablet (40 mg total) by mouth 2 (two) times daily.   No facility-administered encounter medications on file as of 07/24/2023.    Allergies (verified) Bee venom, Codeine, Morphine and codeine, Shellfish allergy,  Bactrim [sulfamethoxazole-trimethoprim], Norvasc  [amlodipine ], and Nitrofurantoin   History: Past Medical History:  Diagnosis Date   Actinic keratosis    Acute diverticulitis 08/17/2022   Anemia    Aortic atherosclerosis (HCC)    Atrial myxoma 03/17/2018   a.) TTE 03/17/2018 --> 2.4 x 2.5 cm LEFT atrial mass attached at fossa ovalis/atrial septum; pedunculated in shape and solid in texture; b.) s/p atrial myoxma resection 03/21/2018; initially attempted via minimally invasive RIGHT thoracotomy approach, however due to bleeding complications, procedure required median sternotomy and patient going on cardiopulmonary bypass   Basal cell carcinoma 05/17/2021   L upper back 5.0 cm lat to spine - ED&C   Cardiomegaly    Complication of anesthesia    a.) severe PONV   Diastolic dysfunction    a.) TTE 03/17/2018: EF 55-60%, LVH, mild PR, G1DD; b.) TTE 07/25/2018: EF 60-65%, RVSP 44.9, mild-mod MR/TR, G1DD; c.) TTE 12/18/2018: EF 60-65%; d.) TTE 06/12/2019: EF 50-55, mild BAE, triv TR, PASP 37.5, G2DD; e.) TTE 07/22/2020: EF 60-65%, mild MR, mod TR, RVSP 42.7, G1DD; f.) TTE 05/08/2021: EF 60-65%, mild LVH, mild-mod TR, AoV sclerosis without stenosis   Diverticulosis    Dyspnea    Essential hypertension    Fatigue 12/19/2021   GERD (gastroesophageal reflux disease)    Glaucoma    Hemorrhagic cerebrovascular accident (CVA) (HCC) 1990   Hiatal hernia s/p repair 11/2021    History of 2019 novel coronavirus disease (COVID-19)    a.) 05/30/20 - Tx'd with covid 19 pill; b.) home Ag testing (+) 10/2021   History of blood transfusion    History of left atrial appendage closure 03/21/2018   a.) #45 Atricure   Hyperlipidemia    Hypoxia    Incidental pulmonary nodule, > 3mm and < 8mm 03/20/2018   Noted on CT scan - needs f/u imaging 6 months   Long-term use of aspirin  therapy    Lower GI bleed 08/17/2022   Lung fibrosis (HCC) 11/16/2020   Nodule of upper lobe of right lung    NSVT (nonsustained  ventricular tachycardia) (HCC) 05/13/2018   a.) holter 05/13/2018 --> 5 short runs NSVT   PAF (paroxysmal atrial fibrillation) (HCC) 05/13/2018   a.) holter 05/13/2018 --> intermittent episodes with longest lasting approx 1.5 hours (burden <1%); b.) CHA2DS2VASc = 7 (age x2, sex, HTN, CVA x2, vascular disease); c.) s/p LAA closure 03/21/2018; d.) rate/rhythm now maintained intrinsically without pharmacological intervention; no OAC   Pain due to onychomycosis of toenails of both feet 07/28/2020   Paraesophageal hernia    PONV (postoperative nausea and vomiting)    PSVT (paroxysmal supraventricular tachycardia) (HCC) 05/13/2018   a.) holter 05/13/2018 --> frequent runs of SVT noted during study   Pulmonary fibrosis (HCC)    Pulmonary hypertension (HCC)    S/P repair of paraesophageal hernia 11/14/2021   Sinus bradycardia    Squamous cell carcinoma of skin 11/12/2019   L distal dorsum forearm - ED&C    Vitamin D  deficiency 01/02/2023  Past Surgical History:  Procedure Laterality Date   BUNIONECTOMY Bilateral    CLIPPING OF ATRIAL APPENDAGE  03/21/2018   Procedure: Clipping Of Atrial Appendage - using an AtriCure 45mm clip;  Surgeon: Dusty Sudie DEL, MD;  Location: [facility] OR;  Service: Open Heart Surgery;;   COLONOSCOPY WITH PROPOFOL  N/A 10/23/2022   Procedure: COLONOSCOPY WITH PROPOFOL ;  Surgeon: Therisa Bi, MD;  Location: [facility] ENDOSCOPY;  Service: Gastroenterology;  Laterality: N/A;   ESOPHAGOGASTRODUODENOSCOPY (EGD) WITH PROPOFOL  N/A 09/06/2021   Procedure: ESOPHAGOGASTRODUODENOSCOPY (EGD) WITH PROPOFOL ;  Surgeon: Therisa Bi, MD;  Location: [facility] ENDOSCOPY;  Service: Gastroenterology;  Laterality: N/A;   EXCISION OF ATRIAL MYXOMA N/A 03/21/2018   Procedure: Excision Of Atrial Myxoma;  Surgeon: Dusty Sudie DEL, MD;  Location: [facility] OR;  Service: Open Heart Surgery;  Laterality: N/A;   GLAUCOMA SURGERY Left 11/17/2013   RECTOCELE REPAIR     RIGHT/LEFT HEART CATH AND CORONARY ANGIOGRAPHY N/A  03/19/2018   Procedure: RIGHT/LEFT HEART CATH AND CORONARY ANGIOGRAPHY;  Surgeon: Darron Deatrice LABOR, MD;  Location: [facility] INVASIVE CV LAB;  Service: Cardiovascular;  Laterality: N/A;   TEE WITHOUT CARDIOVERSION N/A 03/21/2018   Procedure: TRANSESOPHAGEAL ECHOCARDIOGRAM (TEE);  Surgeon: Dusty Sudie DEL, MD;  Location: [facility] OR;  Service: Open Heart Surgery;  Laterality: N/A;   TONSILLECTOMY     TOTAL ABDOMINAL HYSTERECTOMY  01/09/1980   TRABECULECTOMY Right 07/06/2015   VIDEO BRONCHOSCOPY WITH ENDOBRONCHIAL NAVIGATION N/A 05/24/2023   Procedure: VIDEO BRONCHOSCOPY WITH ENDOBRONCHIAL NAVIGATION;  Surgeon: Parris Manna, MD;  Location: [facility] ORS;  Service: Thoracic;  Laterality: N/A;   VIDEO BRONCHOSCOPY WITH ENDOBRONCHIAL ULTRASOUND N/A 05/24/2023   Procedure: BRONCHOSCOPY, WITH EBUS;  Surgeon: Parris Manna, MD;  Location: [facility] ORS;  Service: Thoracic;  Laterality: N/A;   VISCERAL ANGIOGRAPHY N/A 08/17/2022   Procedure: VISCERAL ANGIOGRAPHY;  Surgeon: Marea Selinda RAMAN, MD;  Location: [facility] INVASIVE CV LAB;  Service: Cardiovascular;  Laterality: N/A;   XI ROBOTIC ASSISTED PARAESOPHAGEAL HERNIA REPAIR N/A 11/14/2021   Procedure: XI ROBOTIC ASSISTED PARAESOPHAGEAL HERNIA REPAIR, RNFA to assist;  Surgeon: Jordis Laneta FALCON, MD;  Location: [facility] ORS;  Service: General;  Laterality: N/A;   Family History  Problem Relation Age of Onset   Colon cancer Brother    Cancer Brother        neck tumor cancerous    Esophageal varices Brother    Colon cancer Maternal Uncle    Colon cancer Maternal Grandmother    Kidney disease Brother    Coronary artery disease Brother        with stent, cabg   Breast cancer Neg Hx    Social History   Socioeconomic History   Marital status: Widowed    Spouse name: Not on file   Number of children: Not on file   Years of education: Not on file   Highest education level: Bachelor's degree (e.g., BA, AB, BS)  Occupational History   Not on file  Tobacco Use   Smoking status:  Never   Smokeless tobacco: Never  Vaping Use   Vaping status: Never Used  Substance and Sexual Activity   Alcohol use: No    Alcohol/week: 0.0 standard drinks of alcohol   Drug use: No   Sexual activity: Not Currently  Other Topics Concern   Not on file  Social History Narrative   L[place],  husband died Dec 20, 2018 due to brain hemorrhage   Daughter in law Saray Capasso    widowed   Social Drivers of Health  Financial Resource Strain: Low Risk  (07/24/2023)   Overall Financial Resource Strain (CARDIA)    Difficulty of Paying Living Expenses: Not hard at all  Food Insecurity: No Food Insecurity (07/24/2023)   Hunger Vital Sign    Worried About Running Out of Food in the Last Year: Never true    Ran Out of Food in the Last Year: Never true  Transportation Needs: No Transportation Needs (07/24/2023)   PRAPARE - Administrator, Civil Service (Medical): No    Lack of Transportation (Non-Medical): No  Physical Activity: Sufficiently Active (07/24/2023)   Exercise Vital Sign    Days of Exercise per Week: 6 days    Minutes of Exercise per Session: 50 min  Stress: No Stress Concern Present (07/24/2023)   [employer] of Occupational Health - Occupational Stress Questionnaire    Feeling of Stress: Not at all  Social Connections: Moderately Integrated (07/24/2023)   Social Connection and Isolation Panel    Frequency of Communication with Friends and Family: More than three times a week    Frequency of Social Gatherings with Friends and Family: More than three times a week    Attends Religious Services: More than 4 times per year    Active Member of [employer] or Organizations: Yes    Attends Banker Meetings: More than 4 times per year    Marital Status: Widowed    Tobacco Counseling Counseling given: Not Answered    Clinical Intake:  Pre-visit preparation completed: Yes  Pain : No/denies pain     BMI - recorded: 17.38 Nutritional Status: BMI  <19  Underweight Nutritional Risks: None Diabetes: No  Lab Results  Component Value Date   HGBA1C 5.3 03/20/2018   HGBA1C 5.6 02/08/2015     How often do you need to have someone help you when you read instructions, pamphlets, or other written materials from your doctor or pharmacy?: 1 - Never  Interpreter Needed?: No  Information entered by :: R. Maeson Lourenco LPN   Activities of Daily Living     07/24/2023   10:56 AM 05/22/2023    3:28 PM  In your present state of health, do you have any difficulty performing the following activities:  Hearing? 1   Comment wears aid in left ear   Vision? 0   Comment glasses   Difficulty concentrating or making decisions? 0   Walking or climbing stairs? 0   Dressing or bathing? 0   Doing errands, shopping? 0 0  Preparing Food and eating ? N   Using the Toilet? N   In the past six months, have you accidently leaked urine? N   Do you have problems with loss of bowel control? Y   Managing your Medications? N   Managing your Finances? N   Housekeeping or managing your Housekeeping? N     Patient Care Team: Bair, Kalpana, MD as PCP - General (Family Medicine) Darron Deatrice LABOR, MD as PCP - Cardiology (Cardiology) Parris Manna, MD as Consulting Physician (Pulmonary Disease)  I have updated your Care Teams any recent Medical Services you may have received from other providers in the past year.     Assessment:   This is a routine wellness examination for [place].  Hearing/Vision screen Hearing Screening - Comments:: Wears aid left ear Vision Screening - Comments:: glasses   Goals Addressed             This Visit's Progress    Patient Stated  Wants to stay active and gain some weight       Depression Screen     07/24/2023   11:03 AM 07/02/2023    3:13 PM 05/01/2023   11:12 AM 02/15/2023    7:50 AM 12/27/2022    9:19 AM 08/27/2022   11:17 AM 05/15/2022    8:38 AM  PHQ 2/9 Scores  PHQ - 2 Score 0 0 1 0 0 0 0  PHQ- 9 Score  0 0 2 0 0 0 0    Fall Risk     07/24/2023   10:59 AM 07/02/2023    3:13 PM 05/01/2023   11:12 AM 02/15/2023    7:49 AM 12/27/2022    9:19 AM  Fall Risk   Falls in the past year? 0 0 0 0 0  Number falls in past yr: 0 0 0 0 0  Injury with Fall? 0 0 0 0 0  Risk for fall due to : No Fall Risks No Fall Risks No Fall Risks No Fall Risks No Fall Risks  Follow up Falls evaluation completed;Falls prevention discussed Falls evaluation completed Falls evaluation completed;Education provided Falls evaluation completed;Education provided Falls evaluation completed    MEDICARE RISK AT HOME:  Medicare Risk at Home Any stairs in or around the home?: Yes If so, are there any without handrails?: No Home free of loose throw rugs in walkways, pet beds, electrical cords, etc?: Yes Adequate lighting in your home to reduce risk of falls?: Yes Life alert?: No Use of a cane, walker or w/c?: No Grab bars in the bathroom?: Yes Shower chair or bench in shower?: No Elevated toilet seat or a handicapped toilet?: Yes  TIMED UP AND GO:  Was the test performed?  No  Cognitive Function: 6CIT completed        07/24/2023   11:07 AM 05/17/2022    1:05 PM 04/14/2019    9:49 AM  6CIT Screen  What Year? 0 points 0 points 0 points  What month? 0 points 0 points 0 points  What time? 0 points 0 points 0 points  Count back from 20 0 points 0 points 0 points  Months in reverse 0 points 0 points 0 points  Repeat phrase 0 points 0 points 0 points  Total Score 0 points 0 points 0 points    Immunizations Immunization History  Administered Date(s) Administered   Fluad Quad(high Dose 65+) 11/01/2022   Influenza, High Dose Seasonal PF 10/27/2019, 10/20/2020, 11/01/2022   Influenza,inj,Quad PF,6+ Mos 09/22/2015   Influenza-Unspecified 10/08/2017, 10/27/2018, 10/24/2021   Moderna Covid-19 Fall Seasonal Vaccine 62yrs & older 01/31/2022, 11/23/2022   Moderna Sars-Covid-2 Vaccination 01/23/2019, 02/20/2019, 11/24/2019,  05/07/2020   PNEUMOCOCCAL CONJUGATE-20 01/04/2022   Pfizer Covid-19 Vaccine Bivalent Booster 68yrs & up 09/29/2020, 06/06/2021   Pneumococcal Conjugate-13 03/24/2014   Pneumococcal-Unspecified 11/17/2014   Respiratory Syncytial Virus Vaccine,Recomb Aduvanted(Arexvy) 10/31/2021   Tdap 05/02/2020   Zoster Recombinant(Shingrix) 09/30/2017, 12/23/2017    Screening Tests Health Maintenance  Topic Date Due   Medicare Annual Wellness (AWV)  05/17/2023   COVID-19 Vaccine (9 - Moderna risk 2024-25 season) 05/23/2023   INFLUENZA VACCINE  08/09/2023   DTaP/Tdap/Td (2 - Td or Tdap) 05/03/2030   Pneumococcal Vaccine: 50+ Years  Completed   Zoster Vaccines- Shingrix  Completed   DEXA SCAN  Addressed   Hepatitis B Vaccines  Aged Out   HPV VACCINES  Aged Out   Meningococcal B Vaccine  Aged Out    Health Maintenance  Health Maintenance  Due  Topic Date Due   Medicare Annual Wellness (AWV)  05/17/2023   COVID-19 Vaccine (9 - Moderna risk 2024-25 season) 05/23/2023   Health Maintenance Items Addressed: Discussed the need to update flu and covid vaccines annually  Additional Screening:  Vision Screening: Recommended annual ophthalmology exams for early detection of glaucoma and other disorders of the eye.Up to date [facility] Would you like a referral to an eye doctor? No    Dental Screening: Recommended annual dental exams for proper oral hygiene  Community Resource Referral / Chronic Care Management: CRR required this visit?  No   CCM required this visit?  No   Plan:    I have personally reviewed and noted the following in the patient's chart:   Medical and social history Use of alcohol, tobacco or illicit drugs  Current medications and supplements including opioid prescriptions. Patient is not currently taking opioid prescriptions. Functional ability and status Nutritional status Physical activity Advanced directives List of other physicians Hospitalizations, surgeries,  and ER visits in previous 12 months Vitals Screenings to include cognitive, depression, and falls Referrals and appointments  In addition, I have reviewed and discussed with patient certain preventive protocols, quality metrics, and best practice recommendations. A written personalized care plan for preventive services as well as general preventive health recommendations were provided to patient.   Angeline Fredericks, LPN   [id number]/83/[id number]   After Visit Summary: (MyChart) Due to this being a telephonic visit, the after visit summary with patients personalized plan was offered to patient via MyChart   Notes: Nothing significant to report at this time.

## 2023-07-24 NOTE — Patient Instructions (Signed)
 Ms. Herschberger , Thank you for taking time out of your busy schedule to complete your Annual Wellness Visit with me. I enjoyed our conversation and look forward to speaking with you again next year. I, as well as your care team,  appreciate your ongoing commitment to your health goals. Please review the following plan we discussed and let me know if I can assist you in the future. Your Game plan/ To Do List    Referrals: If you haven't heard from the office you've been referred to, please reach out to them at the phone provided.  Remember to update your fu and covid vaccines annually Follow up Visits: Next Medicare AWV with our clinical staff: 07/27/24 @ 1:40   Have you seen your provider in the last 6 months (3 months if uncontrolled diabetes)? Yes Next Office Visit with your provider: 08/14/23  Clinician Recommendations:  Aim for 30 minutes of exercise or brisk walking, 6-8 glasses of water, and 5 servings of fruits and vegetables each day.       This is a list of the screening recommended for you and due dates:  Health Maintenance  Topic Date Due   COVID-19 Vaccine (9 - Moderna risk 2024-25 season) 05/23/2023   Flu Shot  08/09/2023   Medicare Annual Wellness Visit  07/23/2024   DTaP/Tdap/Td vaccine (2 - Td or Tdap) 05/03/2030   Pneumococcal Vaccine for age over 50  Completed   Zoster (Shingles) Vaccine  Completed   DEXA scan (bone density measurement)  Addressed   Hepatitis B Vaccine  Aged Out   HPV Vaccine  Aged Out   Meningitis B Vaccine  Aged Out    Advanced directives: (In Chart) A copy of your advanced directives are scanned into your chart should your provider ever need it. Advance Care Planning is important because it:  [x]  Makes sure you receive the medical care that is consistent with your values, goals, and preferences  [x]  It provides guidance to your family and loved ones and reduces their decisional burden about whether or not they are making the right decisions based on  your wishes.

## 2023-08-14 ENCOUNTER — Ambulatory Visit (INDEPENDENT_AMBULATORY_CARE_PROVIDER_SITE_OTHER)

## 2023-08-14 ENCOUNTER — Ambulatory Visit: Payer: Self-pay

## 2023-08-14 VITALS — BP 129/80 | HR 79 | Temp 97.9°F | Ht 61.0 in | Wt 100.8 lb

## 2023-08-14 DIAGNOSIS — E785 Hyperlipidemia, unspecified: Secondary | ICD-10-CM | POA: Diagnosis not present

## 2023-08-14 DIAGNOSIS — K219 Gastro-esophageal reflux disease without esophagitis: Secondary | ICD-10-CM | POA: Diagnosis not present

## 2023-08-14 DIAGNOSIS — I10 Essential (primary) hypertension: Secondary | ICD-10-CM

## 2023-08-14 DIAGNOSIS — J309 Allergic rhinitis, unspecified: Secondary | ICD-10-CM | POA: Diagnosis not present

## 2023-08-14 DIAGNOSIS — D509 Iron deficiency anemia, unspecified: Secondary | ICD-10-CM | POA: Diagnosis not present

## 2023-08-14 DIAGNOSIS — J84112 Idiopathic pulmonary fibrosis: Secondary | ICD-10-CM | POA: Diagnosis not present

## 2023-08-14 DIAGNOSIS — E871 Hypo-osmolality and hyponatremia: Secondary | ICD-10-CM | POA: Diagnosis not present

## 2023-08-14 DIAGNOSIS — R634 Abnormal weight loss: Secondary | ICD-10-CM

## 2023-08-14 DIAGNOSIS — R0609 Other forms of dyspnea: Secondary | ICD-10-CM

## 2023-08-14 DIAGNOSIS — R0602 Shortness of breath: Secondary | ICD-10-CM | POA: Diagnosis not present

## 2023-08-14 LAB — COMPREHENSIVE METABOLIC PANEL WITH GFR
ALT: 19 U/L (ref 0–35)
AST: 20 U/L (ref 0–37)
Albumin: 3.8 g/dL (ref 3.5–5.2)
Alkaline Phosphatase: 79 U/L (ref 39–117)
BUN: 13 mg/dL (ref 6–23)
CO2: 30 meq/L (ref 19–32)
Calcium: 9.8 mg/dL (ref 8.4–10.5)
Chloride: 98 meq/L (ref 96–112)
Creatinine, Ser: 0.65 mg/dL (ref 0.40–1.20)
GFR: 77.53 mL/min (ref >60.00)
Glucose, Bld: 79 mg/dL (ref 70–99)
Potassium: 4 meq/L (ref 3.5–5.1)
Sodium: 135 meq/L (ref 135–145)
Total Bilirubin: 0.8 mg/dL (ref 0.2–1.2)
Total Protein: 7.4 g/dL (ref 6.0–8.3)

## 2023-08-14 LAB — CBC WITH DIFFERENTIAL/PLATELET
Basophils Absolute: 0 K/uL (ref 0.0–0.1)
Basophils Relative: 0.6 % (ref 0.0–3.0)
Eosinophils Absolute: 0.4 K/uL (ref 0.0–0.7)
Eosinophils Relative: 5 % (ref 0.0–5.0)
HCT: 43.4 % (ref 36.0–46.0)
Hemoglobin: 14.6 g/dL (ref 12.0–15.0)
Lymphocytes Relative: 20.8 % (ref 12.0–46.0)
Lymphs Abs: 1.6 K/uL (ref 0.7–4.0)
MCHC: 33.6 g/dL (ref 30.0–36.0)
MCV: 98.7 fl (ref 78.0–100.0)
Monocytes Absolute: 0.7 K/uL (ref 0.1–1.0)
Monocytes Relative: 9.5 % (ref 3.0–12.0)
Neutro Abs: 5 K/uL (ref 1.4–7.7)
Neutrophils Relative %: 64.1 % (ref 43.0–77.0)
Platelets: 167 K/uL (ref 150.0–400.0)
RBC: 4.39 Mil/uL (ref 3.87–5.11)
RDW: 14.7 % (ref 11.5–15.5)
WBC: 7.8 K/uL (ref 4.0–10.5)

## 2023-08-14 LAB — BRAIN NATRIURETIC PEPTIDE: Pro B Natriuretic peptide (BNP): 124 pg/mL — ABNORMAL HIGH (ref 0.0–100.0)

## 2023-08-14 MED ORDER — PANTOPRAZOLE SODIUM 40 MG PO TBEC: 40.0000 mg | DELAYED_RELEASE_TABLET | Freq: Every day | ORAL | 3 refills | Status: DC

## 2023-08-14 MED ORDER — LOSARTAN POTASSIUM 25 MG PO TABS: 25.0000 mg | ORAL_TABLET | Freq: Two times a day (BID) | ORAL | 3 refills | Status: DC

## 2023-08-14 MED ORDER — ATORVASTATIN CALCIUM 20 MG PO TABS: 20.0000 mg | ORAL_TABLET | Freq: Every day | ORAL | 3 refills | Status: AC

## 2023-08-14 MED ORDER — LORATADINE 10 MG PO TABS
10.0000 mg | ORAL_TABLET | Freq: Every evening | ORAL | 3 refills | Status: AC | PRN
Start: 2023-08-14 — End: ?

## 2023-08-14 NOTE — Assessment & Plan Note (Signed)
-   Tolerating Lipitor 20 mg daily. Continue.

## 2023-08-14 NOTE — Progress Notes (Addendum)
 Established Patient Office Visit TOC from Dr. Hope     Subjective  Patient ID: Wanda Nelson, female    DOB: [birth year]  Age: 89 y.o. MRN: [id number]  Chief Complaint  Patient presents with   Establish Care    She  has a past medical history of Actinic keratosis, Acute diverticulitis (08/17/2022), Anemia, Aortic atherosclerosis (HCC), Atrial myxoma (03/17/2018), Basal cell carcinoma (05/17/2021), Cardiomegaly, Complication of anesthesia, Diastolic dysfunction, Diverticulosis, Dyspnea, Essential hypertension, Fatigue (12/19/2021), GERD (gastroesophageal reflux disease), Glaucoma, Hemorrhagic cerebrovascular accident (CVA) (HCC) (1990), Hiatal hernia s/p repair 11/2021, History of 2019 novel coronavirus disease (COVID-19), History of blood transfusion, History of left atrial appendage closure (03/21/2018), Hyperlipidemia, Hypoxia, Incidental pulmonary nodule, > 3mm and < 8mm (03/20/2018), Long-term use of aspirin  therapy, Lower GI bleed (08/17/2022), Lung fibrosis (HCC) (11/16/2020), Nodule of upper lobe of right lung, NSVT (nonsustained ventricular tachycardia) (HCC) (05/13/2018), PAF (paroxysmal atrial fibrillation) (HCC) (05/13/2018), Pain due to onychomycosis of toenails of both feet (07/28/2020), Paraesophageal hernia, PONV (postoperative nausea and vomiting), PSVT (paroxysmal supraventricular tachycardia) (HCC) (05/13/2018), Pulmonary fibrosis (HCC), Pulmonary hypertension (HCC), S/P repair of paraesophageal hernia (11/14/2021), Sinus bradycardia, Squamous cell carcinoma of skin (11/12/2019), Vitamin D  deficiency (01/02/2023), and Weight loss, non-intentional (04/15/2022).  HPI  Discussed the use of AI scribe software for clinical note transcription with the patient, who gave verbal consent to proceed.  History of Present Illness  Wanda Nelson is a 89 year old female presents for [facility] from Dr. Hope and chronic follow up. Patient was seen by me on 07/02/23 for acute visit  where she presented with request for UTI prophylaxis before going on a cruise. She was prescribed Ciprofloxacin  for UTI prophylaxis, which she did not need.   Dyspnea on exertion: She has been experiencing difficulty breathing, particularly during exertion such as walking for the last 3 days. She typically walks two and a half miles  daily but recently had to stop after one mile due to shortness of breath. She describes the sensation as 'huffing and puffing' without any associated chest pain. Resting alleviates the symptoms. No wheezing or cough, and no shortness of breath at rest or when lying down. She uses an albuterol  nebulizer every morning to help with her breathing. She continues to take Azithromycin  three times a week. She recently completed a 90-day course of steroids. Her breathing difficulties worsen with change in weather conditions.   She has a history of IBS and is currently taking Miralax  to manage her symptoms. She reports improvement in her bowel movements, with Miralax  helping to balance constipation and diarrhea. She adjusts the dosage based on her symptoms.  Her medication regimen includes Tylenol  325 mg at night for joint stiffness, Lipitor 20 mg daily, losartan  twice a day for blood pressure, and a multivitamin three times a week due to previously high B12 levels. She also takes pantoprazole , usually once a day, to manage stomach acid, and she drinks V8 juice to help with previously low sodium levels.  She mentions easy bruising and a history of low hemoglobin, for which she received blood transfusions in the past. She is concerned about her current bruising and recalls a past hemoglobin level of six.  She lives alone and often eats meals from a cafeteria. She tries to maintain a balanced diet but finds cooking for one person challenging.   ROS As per HPI    Objective:     BP 129/80 (BP Location: Right Arm, Patient Position: Sitting, Cuff Size: Small)   Pulse  79   Temp 97.9  F (36.6 C) (Oral)   Ht 5' 1 (1.549 m)   Wt 100 lb 12.8 oz (45.7 kg)   LMP  (LMP Unknown)   SpO2 95%   BMI 19.05 kg/m      08/14/2023   11:10 AM 07/24/2023   11:03 AM 07/02/2023    3:13 PM  Depression screen PHQ 2/9  Decreased Interest 0 0 0  Down, Depressed, Hopeless 0 0 0  PHQ - 2 Score 0 0 0  Altered sleeping 0 0 0  Tired, decreased energy 1 0 0  Change in appetite 0 0 0  Feeling bad or failure about yourself  0 0 0  Trouble concentrating 0 0 0  Moving slowly or fidgety/restless 0 0 0  Suicidal thoughts 0 0 0  PHQ-9 Score 1 0 0  Difficult doing work/chores Somewhat difficult Not difficult at all Not difficult at all      08/14/2023   11:10 AM 07/02/2023    3:13 PM 05/01/2023   11:12 AM 02/15/2023    7:50 AM  GAD 7 : Generalized Anxiety Score  Nervous, Anxious, on Edge 0 0 0 0  Control/stop worrying 0 0 0 0  Worry too much - different things 0 0 0 0  Trouble relaxing 0 0 0 0  Restless 0 0 0 0  Easily annoyed or irritable 0 0 0 0  Afraid - awful might happen 0 0 0 0  Total GAD 7 Score 0 0 0 0  Anxiety Difficulty Not difficult at all Not difficult at all Not difficult at all Not difficult at all      08/14/2023   11:10 AM 07/24/2023   11:03 AM 07/02/2023    3:13 PM  Depression screen PHQ 2/9  Decreased Interest 0 0 0  Down, Depressed, Hopeless 0 0 0  PHQ - 2 Score 0 0 0  Altered sleeping 0 0 0  Tired, decreased energy 1 0 0  Change in appetite 0 0 0  Feeling bad or failure about yourself  0 0 0  Trouble concentrating 0 0 0  Moving slowly or fidgety/restless 0 0 0  Suicidal thoughts 0 0 0  PHQ-9 Score 1 0 0  Difficult doing work/chores Somewhat difficult Not difficult at all Not difficult at all      08/14/2023   11:10 AM 07/02/2023    3:13 PM 05/01/2023   11:12 AM 02/15/2023    7:50 AM  GAD 7 : Generalized Anxiety Score  Nervous, Anxious, on Edge 0 0 0 0  Control/stop worrying 0 0 0 0  Worry too much - different things 0 0 0 0  Trouble relaxing 0 0 0 0   Restless 0 0 0 0  Easily annoyed or irritable 0 0 0 0  Afraid - awful might happen 0 0 0 0  Total GAD 7 Score 0 0 0 0  Anxiety Difficulty Not difficult at all Not difficult at all Not difficult at all Not difficult at all   SDOH Screenings   Food Insecurity: No Food Insecurity (07/24/2023)  Housing: Unknown (07/24/2023)  Transportation Needs: No Transportation Needs (07/24/2023)  Utilities: Not At Risk (07/24/2023)  Alcohol Screen: Low Risk  (07/24/2023)  Depression (PHQ2-9): Low Risk  (08/14/2023)  Financial Resource Strain: Low Risk  (07/24/2023)  Physical Activity: Sufficiently Active (07/24/2023)  Social Connections: Moderately Integrated (07/24/2023)  Stress: No Stress Concern Present (07/24/2023)  Tobacco Use: Low Risk  (08/14/2023)  Health Literacy:  Adequate Health Literacy (07/24/2023)     Physical Exam Constitutional:      General: She is not in acute distress. HENT:     Head: Normocephalic and atraumatic.     Ears:     Comments: Wearing hearing aids    Mouth/Throat:     Pharynx: Oropharynx is clear.  Eyes:     General: No scleral icterus. Cardiovascular:     Rate and Rhythm: Normal rate.     Heart sounds: No murmur heard. Pulmonary:     Comments: Bibasilar fine crackles noted  Abdominal:     General: Bowel sounds are normal. There is no distension.     Palpations: Abdomen is soft.     Tenderness: There is no guarding.  Musculoskeletal:     Cervical back: No rigidity or tenderness.     Right lower leg: No edema.     Left lower leg: No edema.  Lymphadenopathy:     Cervical: No cervical adenopathy.  Skin:    General: Skin is warm.     Comments: Forearm with some senile purpura b/l   Neurological:     Mental Status: She is alert and oriented to person, place, and time. Mental status is at baseline.  Psychiatric:        Mood and Affect: Mood normal.        No results found for any visits on 08/14/23.  The ASCVD Risk score (Arnett DK, et al., 2019) failed to  calculate for the following reasons:   The 2019 ASCVD risk score is only valid for ages 28 to 45     Assessment & Plan:   Dyspnea on exertion Assessment & Plan: Dyspnea on exertion in the setting of interstitial lung disease and history of pneumonia, bronchitis, HTN, hyperlipidemia and s/p cardiac myxoma resection.   Differential includes cardiac causes (MI, CHF), anemia, ILD/pneumonia/pe, electrolytes abnormality.  -  EKG in the clinic with sinus rhythm, HR 61/min with short PR, not significantly different from previous EKG from 07/23/23.  - Repeat CBC, BNP, CMP, iron panel.  - Contacted patient's pulmonologist Dr. Aleskerov through secure chat to discuss symptoms and potential need for follow- up. His office will call the patient to schedule an appointment for 08/15/23. Appreciate his expertise and input in patient care.  - Advise to continue using albuterol  nebulizer daily, Azithromycin  three times a week.  - If chest pain recommend emergent evaluation.   Orders: -     CBC with Differential/Platelet -     Comprehensive metabolic panel with GFR -     Brain natriuretic peptide -     Iron, TIBC and Ferritin Panel -     EKG 12-Lead  Hyperlipidemia, unspecified hyperlipidemia type Assessment & Plan: - Tolerating Lipitor 20 mg daily. Continue.    Orders: -     Atorvastatin  Calcium ; Take 1 tablet (20 mg total) by mouth at bedtime.  Dispense: 90 tablet; Refill: 3  Allergic rhinitis, unspecified seasonality, unspecified trigger -     Loratadine ; Take 1 tablet (10 mg total) by mouth at bedtime as needed.  Dispense: 90 tablet; Refill: 3  Laryngopharyngeal reflux disease Assessment & Plan: Recommend taking Protonix  40 mg once a day, first thing in the morning instead of taking it twice a day. If GERD symptoms worsens on once daily Protonix  she can go back to taking 40 mg BID. Also recommend discussing this with her GI during next visit.   Orders: -     Pantoprazole  Sodium; Take 1  tablet (40  mg total) by mouth daily.  Dispense: 90 tablet; Refill: 3  Iron deficiency anemia, unspecified iron deficiency anemia type Assessment & Plan: Check iron panel in the context of exertional dyspnea and h/o iron deficiency anemia.    Essential hypertension Assessment & Plan: Blood pressure  within goal today. Continue Losartan  25 mg BID.   Orders: -     Comprehensive metabolic panel with GFR  IPF (idiopathic pulmonary fibrosis) (HCC) Assessment & Plan: Follows up with pulmonologist Dr. Parris    Hyponatremia Assessment & Plan: Check CMP   Other orders -     Losartan  Potassium; Take 1 tablet (25 mg total) by mouth 2 (two) times daily.  Dispense: 180 tablet; Refill: 3  I personally spent a total of 55 minutes in the care of the patient today including preparing to see the patient, performing a medically appropriate exam/evaluation, counseling and educating, placing orders, referring and communicating with other health care professionals, documenting clinical information in the EHR, independently interpreting results, and communicating results. This time was separate from time spent for EKG and interpretation of EKG.    Return in about 4 months (around 12/14/2023) for 4 months for chronic follow up .   Luke Shade, MD

## 2023-08-14 NOTE — Assessment & Plan Note (Signed)
 Blood pressure  within goal today. Continue Losartan  25 mg BID.

## 2023-08-14 NOTE — Assessment & Plan Note (Signed)
 Check CMP.  ?

## 2023-08-14 NOTE — Assessment & Plan Note (Signed)
 Recommend taking Protonix  40 mg once a day, first thing in the morning instead of taking it twice a day. If GERD symptoms worsens on once daily Protonix  she can go back to taking 40 mg BID. Also recommend discussing this with her GI during next visit.

## 2023-08-14 NOTE — Assessment & Plan Note (Signed)
 Dyspnea on exertion in the setting of interstitial lung disease and history of pneumonia, bronchitis, HTN, hyperlipidemia and s/p cardiac myxoma resection.   Differential includes cardiac causes (MI, CHF), anemia, ILD/pneumonia/pe, electrolytes abnormality.  -  EKG in the clinic with sinus rhythm, HR 61/min with short PR, not significantly different from previous EKG from 07/23/23.  - Repeat CBC, BNP, CMP, iron panel.  - Contacted patient's pulmonologist Dr. Aleskerov through secure chat to discuss symptoms and potential need for follow- up. His office will call the patient to schedule an appointment for 08/15/23. Appreciate his expertise and input in patient care.  - Advise to continue using albuterol  nebulizer daily, Azithromycin  three times a week.  - If chest pain recommend emergent evaluation.

## 2023-08-14 NOTE — Assessment & Plan Note (Signed)
 Follows up with pulmonologist Dr. Aleskerov

## 2023-08-14 NOTE — Progress Notes (Signed)
 Result discussed during OV on 08/14/23.   Luke Shade, MD

## 2023-08-14 NOTE — Assessment & Plan Note (Signed)
 Check iron panel in the context of exertional dyspnea and h/o iron deficiency anemia.

## 2023-08-15 DIAGNOSIS — J471 Bronchiectasis with (acute) exacerbation: Secondary | ICD-10-CM | POA: Diagnosis not present

## 2023-08-15 DIAGNOSIS — J810 Acute pulmonary edema: Secondary | ICD-10-CM | POA: Diagnosis not present

## 2023-08-15 DIAGNOSIS — J209 Acute bronchitis, unspecified: Secondary | ICD-10-CM | POA: Diagnosis not present

## 2023-08-15 LAB — IRON,TIBC AND FERRITIN PANEL
%SAT: 30 % (ref 16–45)
Ferritin: 86 ng/mL (ref 16–288)
Iron: 93 ug/dL (ref 45–160)
TIBC: 313 ug/dL (ref 250–450)

## 2023-08-23 ENCOUNTER — Ambulatory Visit: Admitting: Podiatry

## 2023-08-29 ENCOUNTER — Ambulatory Visit (INDEPENDENT_AMBULATORY_CARE_PROVIDER_SITE_OTHER): Admitting: Podiatry

## 2023-08-29 ENCOUNTER — Encounter: Payer: Self-pay | Admitting: Podiatry

## 2023-08-29 DIAGNOSIS — B351 Tinea unguium: Secondary | ICD-10-CM

## 2023-08-29 DIAGNOSIS — M79674 Pain in right toe(s): Secondary | ICD-10-CM | POA: Diagnosis not present

## 2023-08-29 DIAGNOSIS — M79675 Pain in left toe(s): Secondary | ICD-10-CM | POA: Diagnosis not present

## 2023-08-29 NOTE — Progress Notes (Signed)
  Subjective:  Patient ID: Wanda Nelson, female    DOB: 1931/03/09,  MRN: [id number]  Wanda Nelson presents to clinic today for: painful thick toenails that are difficult to trim. Pain interferes with ambulation. Aggravating factors include wearing enclosed shoe gear. Pain is relieved with periodic professional debridement.  Chief Complaint  Patient presents with   RFC    Rm3 Routine foot care/ Dr. Luke Shade last visit August 2025    PCP is Bair, [place], [place].  Allergies  Allergen Reactions   Bee Venom Anaphylaxis and Hives   Codeine Other (See Comments)    Altered mental status   Morphine And Codeine     Altered mental status   Shellfish Allergy Anaphylaxis and Hives    Mainly shrimp   Bactrim [Sulfamethoxazole-Trimethoprim]     ? Reaction     Norvasc  [Amlodipine ]     2.5 leg edema    Nitrofurantoin Rash    Review of Systems: Negative except as noted in the HPI.  Objective: No changes noted in today's physical examination. There were no vitals filed for this visit.  Wanda Nelson is a pleasant 89 y.o. female thin build in NAD. AAO x 3.  Vascular Examination: Capillary refill time <3 seconds b/l LE. Palpable pedal pulses b/l LE. Digital hair present b/l. No pedal edema b/l. Skin temperature gradient WNL b/l. No varicosities b/l. No cyanosis or clubbing. No ischemia or gangrene. .  Dermatological Examination: Pedal skin with normal turgor, texture and tone b/l. No open wounds. No interdigital macerations b/l. Toenails 1-5 b/l thickened, discolored, dystrophic with subungual debris. There is pain on palpation to dorsal aspect of nailplates. No hyperkeratotic nor porokeratotic lesions.   Neurological Examination: Protective sensation intact with 10 gram monofilament b/l LE. Vibratory sensation intact b/l LE.   Musculoskeletal Examination: Normal muscle strength 5/5 to all lower extremity muscle groups bilaterally. No pain, crepitus or joint  limitation noted with ROM b/l LE. No gross bony pedal deformities b/l. Patient ambulates independently without assistive aids.  Assessment/Plan: 1. Pain due to onychomycosis of toenails of both feet   Patient was evaluated and treated. All patient's and/or POA's questions/concerns addressed on today's visit. Toenails 1-5 debrided in length and girth without incident. Continue soft, supportive shoe gear daily. Report any pedal injuries to medical professional. Call office if there are any questions/concerns. -Patient/POA to call should there be question/concern in the interim.   Return in about 3 months (around 11/29/2023).  Delon LITTIE Merlin, DPM      [place] LOCATION: [address], [place] [address]                   Office [phone]   [facility] LOCATION: [address] [place], [place] [address] Office [phone]

## 2023-09-05 DIAGNOSIS — R0689 Other abnormalities of breathing: Secondary | ICD-10-CM | POA: Diagnosis not present

## 2023-09-05 DIAGNOSIS — J849 Interstitial pulmonary disease, unspecified: Secondary | ICD-10-CM | POA: Diagnosis not present

## 2023-09-05 DIAGNOSIS — R0600 Dyspnea, unspecified: Secondary | ICD-10-CM | POA: Diagnosis not present

## 2023-09-05 DIAGNOSIS — J8410 Pulmonary fibrosis, unspecified: Secondary | ICD-10-CM | POA: Diagnosis not present

## 2023-09-05 DIAGNOSIS — J479 Bronchiectasis, uncomplicated: Secondary | ICD-10-CM | POA: Diagnosis not present

## 2023-09-05 NOTE — Progress Notes (Signed)
 DIVISION OF PULMONARY AND CRITICAL CARE MEDICINE                              FOLLOW UP ENCOUNTER     Chief complaint: Pulmonary fibrosis  History of Present Illness Wanda Nelson is a 89 year old female with pulmonary fibrosis who presents for follow-up of her lung condition.  She is now able to walk a second mile on most days, which she attributes to having more time and fewer interruptions. Her DLCO was 40% in June of the previous year and is now 50%.  She has been on colchicine for 20 days as an anti-inflammatory treatment. Initially, she was surprised by this prescription, as she associated colchicine with gout treatment.  She uses an albuterol  nebulizer in the morning before walking, as she cannot use a puffer. Her medication regimen includes a baby aspirin , 20 mg Lipitor, calcium  and vitamin D , losartan  25 mg twice a day, montelukast at night, and Claritin  for allergies. She takes a multivitamin three times a week due to previously high B12 levels. Her acid reflux medication was reduced to once a day, but she is considering returning to twice daily dosing.  No swelling of the ankles and no severe acid reflux symptoms. She has a history of bronchitis, which she experienced during a recent trip. She keeps an emergency pack of Z-Pak and steroids for potential respiratory issues during travel.  No nervousness and she confirms using her nebulizer and taking her prescribed medications regularly.   Past Medical History:   Past Medical History:  Diagnosis Date  . Allergic rhinitis 05/16/2021  . Anatomical narrow angle   . Cataract cortical, senile    OD  . Elevated cholesterol   . Glaucoma (increased eye pressure)   . Hemorrhagic stroke (CMS/HHS-HCC) 11/1988  . Hypertension   . Laryngopharyngeal reflux disease 07/31/2018  . Pulmonary fibrosis (CMS/HHS-HCC) 05/08/2021    Past Surgical History:   Past Surgical History:  Procedure Laterality Date  .  HYSTERECTOMY TOTAL ABDOMINAL W/REMOVAL TUBES &/OR OVARIES  1982  . GLAUCOMA EYE SURGERY Left 11/17/2013   combo w/mmc  . LENS EYE SURGERY Left 11/17/2013   combo w/mmc  . LENS EYE SURGERY Left 02/12/2014   YAG  . TRABECULECTOMY AB EXTERNO Right 07/06/2015   Procedure: FISTULIZATION OF SCLERA FOR GLAUCOMA; TRABECULECTOMY AB EXTERNO IN ABSENCE OF PREVIOUS SURGERY;  Surgeon: Patsi Him, MD;  [facility];  Service: Ophthalmology;  Laterality: Right;  PAT phone call 1 to 5 days prior to surgery  . Bunion both feet    . REPAIR RECTOCELE      Allergies:   Allergies  Allergen Reactions  . Bee Venom Protein (Honey Bee) Anaphylaxis and Hives  . Shellfish Derived Anaphylaxis and Hives    Mainly shrimp  . Venom-Honey Bee Anaphylaxis and Hives  . Alphagan P [Brimonidine] Other (See Comments)    Dry mouth  . Amlodipine  Other (See Comments)    2.5 leg edema  . Azopt [Brinzolamide] Other (See Comments)    Blurry vision  . Codeine Hallucination    Altered mental status  . Combigan [Brimonidine-Timolol] Other (See Comments)    Dry mouth  . Morphine Hallucination    Altered mental status  . Sulfamethoxazole-Trimethoprim Other (See  Comments)    ? Reaction  . Nitrofurantoin Monohyd/M-Cryst Rash    Current Medications:   Prior to Admission medications  Medication Sig Taking? Last Dose  acetaminophen  (TYLENOL ) 325 MG tablet Take 650 mg by mouth every 6 (six) hours as needed Yes Taking  albuterol  (PROVENTIL ) 2.5 mg /3 mL (0.083 %) nebulizer solution Take 3 mLs (2.5 mg total) by nebulization every 6 (six) hours as needed for Wheezing Yes Taking  aspirin  81 MG chewable tablet Take 81 mg by mouth once daily. Yes Taking  atorvastatin  (LIPITOR) 20 MG tablet Take 20 mg by mouth once daily.   Yes Taking  calcium  carbonate-vitamin D3 (CALTRATE 600+D) 600 mg(1,500mg ) -400 unit tablet Take 1 tablet by mouth once daily. Yes Taking  cholecalciferol  (VITAMIN D3) 2,000 unit tablet Take 2,000  Units by mouth once daily Yes Taking  ipratropium-albuteroL  (DUO-NEB) nebulizer solution Take 3 mLs by nebulization every 6 (six) hours as needed for Wheezing or Shortness of Breath Yes Taking  losartan  (COZAAR ) 25 MG tablet Take 25 mg by mouth 2 (two) times daily Yes Taking  multivitamin tablet Take 1 tablet by mouth once daily. Yes Taking  pantoprazole  (PROTONIX ) 20 MG DR tablet Take 1 tablet (20 mg total) by mouth 2 (two) times daily before meals Yes Taking  albuterol  MDI, PROVENTIL , VENTOLIN , PROAIR , HFA 90 mcg/actuation inhaler Inhale 2 inhalations into the lungs every 6 (six) hours as needed for Wheezing Patient not taking: Reported on 07/01/2023  Not Taking  azithromycin  (ZITHROMAX ) 250 MG tablet Take 2 tablets (500mg ) by mouth on Day 1. Take 1 tablet (250mg ) by mouth on Days 2-5.    ciprofloxacin  HCl (CILOXAN ) 0.3 % ophthalmic solution Place 1 drop into the right eye 4 (four) times daily. Start after surgery Patient not taking: Reported on 09/05/2023  Not Taking  ezetimibe  (ZETIA ) 10 mg tablet Take 10 mg by mouth every other day Patient not taking: Reported on 09/05/2023  Not Taking  flaxseed oil Oil Use 1,400 mg Patient not taking: Reported on 09/05/2023  Not Taking  methylPREDNISolone  (MEDROL  DOSEPACK) 4 mg tablet Follow package directions.    montelukast (SINGULAIR) 10 mg tablet Take 1 tablet (10 mg total) by mouth at bedtime for 90 days    omega-3 fatty acids/fish oil 340-1,000 mg capsule Take 1 capsule by mouth 4 (four) times daily Patient not taking: Reported on 09/05/2023  Not Taking  prednisoLONE acetate (PRED FORTE) 1 % ophthalmic suspension Place 1 drop into the right eye 4 (four) times daily. Patient not taking: Reported on 09/05/2023  Not Taking  TORsemide (DEMADEX) 20 MG tablet Take 1 tablet (20 mg total) by mouth every other day Only for 3 days Patient not taking: Reported on 09/05/2023  Not Taking    Family History:   Family History  Problem Relation Name Age of Onset  .  Glaucoma Maternal Aunt    . Glaucoma Cousin    . Vision loss Neg Hx    . Blindness Neg Hx    . Macular degeneration Neg Hx    . Anesthesia problems Neg Hx      Social History:   Social History   Socioeconomic History  . Marital status: Widowed  Tobacco Use  . Smoking status: Never  . Smokeless tobacco: Never  Substance and Sexual Activity  . Alcohol use: No  . Drug use: No   Social Drivers of Corporate investment banker Strain: Low Risk  (07/24/2023)   Received from [facility]   Overall Financial  Resource Strain (CARDIA)   . How hard is it for you to pay for the very basics like food, housing, medical care, and heating?: Not hard at all  Food Insecurity: No Food Insecurity (07/24/2023)   Received from [facility]   Hunger Vital Sign   . Within the past 12 months, you worried that your food would run out before you got the money to buy more.: Never true   . Within the past 12 months, the food you bought just didn't last and you didn't have money to get more.: Never true  Transportation Needs: No Transportation Needs (07/24/2023)   Received from [facility] - Transportation   . In the past 12 months, has lack of transportation kept you from medical appointments or from getting medications?: No   . In the past 12 months, has lack of transportation kept you from meetings, work, or from getting things needed for daily living?: No  Physical Activity: Sufficiently Active (07/24/2023)   Received from [facility]   Exercise Vital Sign   . On average, how many days per week do you engage in moderate to strenuous exercise (like a brisk walk)?: 6 days   . On average, how many minutes do you engage in exercise at this level?: 50 min  Stress: No Stress Concern Present (07/24/2023)   Received from [facility] - Occupational Stress Questionnaire   . Do you feel stress - tense, restless, nervous, or anxious, or unable to sleep at night  because your mind is troubled all the time - these days?: Not at all  Social Connections: Moderately Integrated (07/24/2023)   Received from [facility]   Social Connection and Isolation Panel   . In a typical week, how many times do you talk on the phone with family, friends, or neighbors?: More than three times a week   . How often do you get together with friends or relatives?: More than three times a week   . How often do you attend church or religious services?: More than 4 times per year   . Do you belong to any clubs or organizations such as church groups, unions, fraternal or athletic groups, or school groups?: Yes   . How often do you attend meetings of the clubs or organizations you belong to?: More than 4 times per year   . Are you married, widowed, divorced, separated, never married, or living with a partner?: Widowed  Housing Stability: Low Risk  (07/01/2023)   Housing Stability Vital Sign   . Unable to Pay for Housing in the Last Year: No   . Number of Times Moved in the Last Year: 0   . Homeless in the Last Year: No    Review of Systems:   A 10 point review of systems is negative, except for the pertinent positives and negatives detailed in the HPI.  Vitals:   Vitals:   09/05/23 1311  BP: (!) 207/94  Pulse: 57  SpO2: 98%  Weight: 45.5 kg (100 lb 5 oz)  Height: 157.5 cm (5' 2)     Body mass index is 18.35 kg/m.  Physical Exam:   Physical Exam Vitals and nursing note reviewed.  Constitutional:      General: in no acute distress.    Appearance: Normal appearance. Is not ill-appearing, toxic-appearing or diaphoretic.  HENT:     Head: Normocephalic and atraumatic.     Right Ear: External ear normal.  Left Ear: External ear normal.  Eyes:     General:        Right eye: No discharge.        Left eye: No discharge.     Extraocular Movements: Extraocular movements intact.     Pupils: Pupils are equal, round, and reactive to light.  Cardiovascular:     Rate  and Rhythm: Normal rate and regular rhythm.     Pulses: Normal pulses.     Heart sounds: Normal heart sounds. No murmur heard.    No friction rub. No gallop.  Abdominal:     General: Bowel sounds are normal.  Skin:    General: Skin is warm and dry.     Capillary Refill: Capillary refill takes less than 2 seconds.  Neurological:     Mental Status: Patient is alert.     Lab and Imaging Results:   Results DIAGNOSTIC DLCO: 50%    Assessment and Plan:   Diagnoses and all orders for this visit:  Pulmonary fibrosis (CMS/HHS-HCC)  Bronchiectasis without complication (CMS/HHS-HCC)  Dyspnea and respiratory abnormalities  Other orders -     azithromycin  (ZITHROMAX ) 250 MG tablet; Take 2 tablets (500mg ) by mouth on Day 1. Take 1 tablet (250mg ) by mouth on Days 2-5. -     methylPREDNISolone  (MEDROL  DOSEPACK) 4 mg tablet; Follow package directions.    Assessment & Plan Bronchiectasis with pulmonary fibrosis and organizing pneumonia Improvement in breathing and pulmonary function tests compared to previous assessments. DLCO improved from 40% to 50% despite aging, indicating a positive response to treatment. Colchicine has been effective in managing inflammation without causing hyperglycemia or osteoporosis. - Continue current medication regimen. - Prescribe an emergency pack of azithromycin  and steroids for use if symptoms of increased mucus, cough, chest discomfort, and dyspnea occur. Store in the refrigerator and take on trips. - Refill albuterol  nebulizer as needed.  Gastroesophageal reflux disease managed for pulmonary fibrosis Pantoprazole  is used primarily for managing lung scarring rather than acid reflux. She does not have severe acid reflux, and the medication is intended to help with pulmonary fibrosis. - Take pantoprazole  20 mg twice daily on an empty stomach to manage lung scarring.     I spent a total of 41 minutes in both face-to-face and non-face-to-face  activities, excluding procedures performed, for this visit on the date of this encounter.   This note has been created using dictation software tool and any typographical errors are purely unintentional.  Patient received an After Visit Summary

## 2023-09-12 DIAGNOSIS — H6123 Impacted cerumen, bilateral: Secondary | ICD-10-CM | POA: Diagnosis not present

## 2023-09-12 DIAGNOSIS — H90A32 Mixed conductive and sensorineural hearing loss, unilateral, left ear with restricted hearing on the contralateral side: Secondary | ICD-10-CM | POA: Diagnosis not present

## 2023-09-12 DIAGNOSIS — H6982 Other specified disorders of Eustachian tube, left ear: Secondary | ICD-10-CM | POA: Diagnosis not present

## 2023-09-14 ENCOUNTER — Emergency Department

## 2023-09-14 ENCOUNTER — Other Ambulatory Visit: Payer: Self-pay

## 2023-09-14 ENCOUNTER — Emergency Department
Admission: EM | Admit: 2023-09-14 | Discharge: 2023-09-14 | Disposition: A | Discharge status: Home / Self Care | Attending: Emergency Medicine | Admitting: Emergency Medicine

## 2023-09-14 DIAGNOSIS — J8410 Pulmonary fibrosis, unspecified: Secondary | ICD-10-CM | POA: Diagnosis not present

## 2023-09-14 DIAGNOSIS — I10 Essential (primary) hypertension: Secondary | ICD-10-CM | POA: Diagnosis not present

## 2023-09-14 DIAGNOSIS — R030 Elevated blood-pressure reading, without diagnosis of hypertension: Secondary | ICD-10-CM

## 2023-09-14 DIAGNOSIS — E871 Hypo-osmolality and hyponatremia: Secondary | ICD-10-CM | POA: Insufficient documentation

## 2023-09-14 DIAGNOSIS — R0602 Shortness of breath: Secondary | ICD-10-CM | POA: Diagnosis not present

## 2023-09-14 DIAGNOSIS — R069 Unspecified abnormalities of breathing: Secondary | ICD-10-CM | POA: Diagnosis not present

## 2023-09-14 DIAGNOSIS — J9811 Atelectasis: Secondary | ICD-10-CM | POA: Diagnosis not present

## 2023-09-14 DIAGNOSIS — R609 Edema, unspecified: Secondary | ICD-10-CM | POA: Diagnosis not present

## 2023-09-14 LAB — RESP PANEL BY RT-PCR (RSV, FLU A&B, COVID)  RVPGX2
Influenza A by PCR: NEGATIVE
Influenza B by PCR: NEGATIVE
Resp Syncytial Virus by PCR: NEGATIVE
SARS Coronavirus 2 by RT PCR: NEGATIVE

## 2023-09-14 LAB — CBC WITH DIFFERENTIAL/PLATELET
Abs Immature Granulocytes: 0.06 K/uL (ref 0.00–0.07)
Basophils Absolute: 0 K/uL (ref 0.0–0.1)
Basophils Relative: 0 %
Eosinophils Absolute: 0.4 K/uL (ref 0.0–0.5)
Eosinophils Relative: 5 %
HCT: 41.3 % (ref 36.0–46.0)
Hemoglobin: 14.2 g/dL (ref 12.0–15.0)
Immature Granulocytes: 1 %
Lymphocytes Relative: 21 %
Lymphs Abs: 1.6 K/uL (ref 0.7–4.0)
MCH: 33 pg (ref 26.0–34.0)
MCHC: 34.4 g/dL (ref 30.0–36.0)
MCV: 96 fL (ref 80.0–100.0)
Monocytes Absolute: 0.6 K/uL (ref 0.1–1.0)
Monocytes Relative: 8 %
Neutro Abs: 4.9 K/uL (ref 1.7–7.7)
Neutrophils Relative %: 65 %
Platelets: 153 K/uL (ref 150–400)
RBC: 4.3 MIL/uL (ref 3.87–5.11)
RDW: 12.8 % (ref 11.5–15.5)
WBC: 7.6 K/uL (ref 4.0–10.5)
nRBC: 0 % (ref 0.0–0.2)

## 2023-09-14 LAB — TROPONIN I (HIGH SENSITIVITY): Troponin I (High Sensitivity): 7 ng/L (ref <18)

## 2023-09-14 LAB — COMPREHENSIVE METABOLIC PANEL WITH GFR
ALT: 27 U/L (ref 0–44)
AST: 26 U/L (ref 15–41)
Albumin: 3.4 g/dL — ABNORMAL LOW (ref 3.5–5.0)
Alkaline Phosphatase: 91 U/L (ref 38–126)
Anion gap: 9 (ref 5–15)
BUN: 12 mg/dL (ref 8–23)
CO2: 24 mmol/L (ref 22–32)
Calcium: 9 mg/dL (ref 8.9–10.3)
Chloride: 94 mmol/L — ABNORMAL LOW (ref 98–111)
Creatinine, Ser: 0.68 mg/dL (ref 0.44–1.00)
GFR, Estimated: >60 mL/min (ref >60)
Glucose, Bld: 103 mg/dL — ABNORMAL HIGH (ref 70–99)
Potassium: 3.6 mmol/L (ref 3.5–5.1)
Sodium: 127 mmol/L — ABNORMAL LOW (ref 135–145)
Total Bilirubin: 1.4 mg/dL — ABNORMAL HIGH (ref 0.0–1.2)
Total Protein: 7.2 g/dL (ref 6.5–8.1)

## 2023-09-14 LAB — BRAIN NATRIURETIC PEPTIDE: B Natriuretic Peptide: 185.3 pg/mL — ABNORMAL HIGH (ref 0.0–100.0)

## 2023-09-14 MED ORDER — CLONIDINE HCL 0.1 MG PO TABS
0.1000 mg | ORAL_TABLET | Freq: Once | ORAL | Status: AC
  Administered 2023-09-14: 0.1 mg via ORAL
  Filled 2023-09-14: qty 1

## 2023-09-14 NOTE — ED Notes (Signed)
 CCMD called to monitor patient

## 2023-09-14 NOTE — ED Triage Notes (Signed)
 Pt to ED via [facility] from [facility] independent living for c/o difficulty breathing since yesterday and bilateral leg swelling. Pt A&O, ambulatory, NAD. Denies pain.

## 2023-09-14 NOTE — ED Provider Notes (Signed)
 [facility] Provider Note    Event Date/Time   First MD Initiated Contact with Patient 09/14/23 1325     (approximate)   History   Leg Swelling   HPI  Wanda Nelson is a 89 y.o. female with a history of pulmonary fibrosis, hypertension, and a remote history of hemorrhagic stroke who presents with increased shortness of breath over the last day, worse with exertion.  She denies associated cough, chest pain, or fever.  She also noted that her blood pressure was significantly elevated over 200 systolic today.  I reviewed the past medical records.  The patient was seen by Dr. Parris from pulmonology on 8/28 for follow-up of her pulmonary fibrosis.  She had no acute symptoms at that time but was prescribed azithromycin  and steroid in case she had an exacerbation of her PF symptoms.   Physical Exam   Triage Vital Signs: ED Triage Vitals  Encounter Vitals Group     BP 09/14/23 1326 (!) 190/89     Girls Systolic BP Percentile --      Girls Diastolic BP Percentile --      Boys Systolic BP Percentile --      Boys Diastolic BP Percentile --      Pulse Rate 09/14/23 1326 67     Resp --      Temp 09/14/23 1326 97.8 F (36.6 C)     Temp Source 09/14/23 1326 Oral     SpO2 09/14/23 1326 96 %     Weight 09/14/23 1325 95 lb (43.1 kg)     Height 09/14/23 1325 5' 1 (1.549 m)     Head Circumference --      Peak Flow --      Pain Score 09/14/23 1323 0     Pain Loc --      Pain Education --      Exclude from Growth Chart --     Most recent vital signs: Vitals:   09/14/23 1326 09/14/23 1330  BP: (!) 190/89 (!) 184/93  Pulse: 67 64  Temp: 97.8 F (36.6 C)   SpO2: 96% 96%     General: Alert, well-appearing for age, no distress.  CV:  Good peripheral perfusion.  Resp:  Normal effort.  Diffuse Rales bilaterally.  No wheezes or rhonchi. Abd:  No distention.  Other:  No peripheral edema.   ED Results / Procedures / Treatments   Labs (all labs  ordered are listed, but only abnormal results are displayed) Labs Reviewed  BRAIN NATRIURETIC PEPTIDE - Abnormal; Notable for the following components:      Result Value   B Natriuretic Peptide 185.3 (*)    All other components within normal limits  COMPREHENSIVE METABOLIC PANEL WITH GFR - Abnormal; Notable for the following components:   Sodium 127 (*)    Chloride 94 (*)    Glucose, Bld 103 (*)    Albumin  3.4 (*)    Total Bilirubin 1.4 (*)    All other components within normal limits  RESP PANEL BY RT-PCR (RSV, FLU A&B, COVID)  [id number]  CBC WITH DIFFERENTIAL/PLATELET  TROPONIN I (HIGH SENSITIVITY)     EKG  ED ECG REPORT I, Waylon Cassis, the attending physician, personally viewed and interpreted this ECG.  Date: 09/14/2023 EKG Time: 1331 Rate: 93 Rhythm: Ventricular bigeminy QRS Axis: normal Intervals: LPFB ST/T Wave abnormalities: Repolarization abnormality Narrative Interpretation: no evidence of acute ischemia    RADIOLOGY  Chest x-ray: I independently viewed and interpreted  the images; chronic appearing interstitial opacities consistent with pulmonary fibrosis.  Radiology report indicates the following:  IMPRESSION:  1. Chronic interstitial lung disease with areas of scarring,  atelectasis and fibrotic changes along the periphery of both lungs.  2. Sequelae associated with an underlying neoplastic process, most  notably along the periphery of the upper right lung, cannot be  excluded.     PROCEDURES:  Critical Care performed: No  Procedures   MEDICATIONS ORDERED IN ED: Medications  cloNIDine  (CATAPRES ) tablet 0.1 mg (has no administration in time range)     IMPRESSION / MDM / ASSESSMENT AND PLAN / ED COURSE  I reviewed the triage vital signs and the nursing notes.  89 year old female with PMH as noted above presents with acute onset of worsening shortness of breath since yesterday with no concerning associated symptoms.  On exam her blood  pressure is elevated.  Other vital signs are normal.  O2 saturation is in the mid 90s on room air.  The patient does demonstrate slightly increased work of breathing but no acute respiratory distress.  Differential diagnosis includes, but is not limited to, pulmonary fibrosis exacerbation, acute bronchitis, pneumonia, viral syndrome, fluid overload.  We will obtain chest x-ray, lab workup, and reassess.  Patient's presentation is most consistent with acute presentation with potential threat to life or bodily function.  The patient is on the cardiac monitor to evaluate for evidence of arrhythmia and/or significant heart rate changes.  ----------------------------------------- 2:56 PM on 09/14/2023 -----------------------------------------  Chest x-ray shows chronic interstitial findings with no acute abnormality.  Respiratory panel is negative.  BNP is minimally elevated.  Troponin is negative.  CMP shows mild hyponatremia.  CBC shows no acute findings.  On reassessment, the patient continues to appear well.  She is not demonstrating any increased work of breathing or respiratory distress.  She declines a nebulizer treatment.  Her blood pressure is still somewhat elevated although she is not having any acute symptoms from this.  I will give a p.o. dose of clonidine  to help bring it down a bit.  The patient continues to feel well and is comfortable going home.  At this time there is no indication for further ED workup or treatment.  Her family members report that she did have some slightly increased leg swelling yesterday, but she took a dose of her torsemide (which she has been off of for some time) today and it seems to be better.  I advised that she could take some additional torsemide for the next 2 to 3 days but did not need IV diuresis.  At this time, the patient is stable for discharge.  She is eager to go home.  I counseled her on the results of the workup and plan of care.  I gave strict  return precautions, and she expressed understanding.   FINAL CLINICAL IMPRESSION(S) / ED DIAGNOSES   Final diagnoses:  Shortness of breath  Elevated blood pressure reading     Rx / DC Orders   ED Discharge Orders     None        Note:  This document was prepared using Dragon voice recognition software and may include unintentional dictation errors.    Jacolyn Pae, MD 09/14/23 [id number]

## 2023-09-14 NOTE — Discharge Instructions (Signed)
 Take your torsemide (the fluid pill) once daily for the next 2 or 3 days to help pull some of the fluid off of your legs.  Follow-up with your primary care doctor and your pulmonologist as scheduled.  Return to the ER for new, worsening, or persistent severe shortness of breath, cough, fever, chest pain, weakness, swelling, or any other new or worsening symptoms that concern you.

## 2023-09-17 DIAGNOSIS — H02886 Meibomian gland dysfunction of left eye, unspecified eyelid: Secondary | ICD-10-CM | POA: Diagnosis not present

## 2023-09-17 DIAGNOSIS — Z961 Presence of intraocular lens: Secondary | ICD-10-CM | POA: Diagnosis not present

## 2023-09-17 DIAGNOSIS — H401112 Primary open-angle glaucoma, right eye, moderate stage: Secondary | ICD-10-CM | POA: Diagnosis not present

## 2023-09-17 DIAGNOSIS — H02883 Meibomian gland dysfunction of right eye, unspecified eyelid: Secondary | ICD-10-CM | POA: Diagnosis not present

## 2023-09-17 DIAGNOSIS — H0288B Meibomian gland dysfunction left eye, upper and lower eyelids: Secondary | ICD-10-CM | POA: Diagnosis not present

## 2023-09-17 DIAGNOSIS — H0288A Meibomian gland dysfunction right eye, upper and lower eyelids: Secondary | ICD-10-CM | POA: Diagnosis not present

## 2023-09-17 DIAGNOSIS — H16223 Keratoconjunctivitis sicca, not specified as Sjogren's, bilateral: Secondary | ICD-10-CM | POA: Diagnosis not present

## 2023-09-17 DIAGNOSIS — H401123 Primary open-angle glaucoma, left eye, severe stage: Secondary | ICD-10-CM | POA: Diagnosis not present

## 2023-09-20 ENCOUNTER — Other Ambulatory Visit
Admission: RE | Admit: 2023-09-20 | Discharge: 2023-09-20 | Disposition: A | Discharge status: Home / Self Care | Source: Ambulatory Visit | Attending: Pulmonary Disease | Admitting: Pulmonary Disease

## 2023-09-20 DIAGNOSIS — J810 Acute pulmonary edema: Secondary | ICD-10-CM | POA: Diagnosis not present

## 2023-09-20 DIAGNOSIS — R7989 Other specified abnormal findings of blood chemistry: Secondary | ICD-10-CM | POA: Diagnosis not present

## 2023-09-20 LAB — D-DIMER, QUANTITATIVE: D-Dimer, Quant: 2.56 ug{FEU}/mL — ABNORMAL HIGH (ref 0.00–0.50)

## 2023-09-20 NOTE — Progress Notes (Signed)
 DIVISION OF PULMONARY AND CRITICAL CARE MEDICINE                              FOLLOW UP ENCOUNTER     Chief complaint: Post hospital evaluation with accelerated hypertension and respiratory ditress  History of Present Illness Wanda Nelson is a 89 year old female with pulmonary fibrosis who presents with sudden onset shortness of breath and elevated blood pressure.  She experienced a sudden onset of shortness of breath and elevated blood pressure systolic over 200 mmHg last Saturday, prompting her to call emergency services. She woke up on Friday afternoon with difficulty breathing, which persisted into Saturday morning, leading her to decide against her usual walk. When EMTs arrived, her blood pressure was recorded at 218 mmHg, and she was taken to the ER. No forgetful episodes, speech difficulties, or facial asymmetry were noted during this event.  She has a history of pulmonary fibrosis and was previously on prednisone  for three months, which was discontinued a week before the current episode. She felt better while on the medication. She recalls walking one mile on Friday morning without significant issues, although she had to stop twice during her walk this morning, which is not unusual for her.  She is currently taking a diuretic, instructed to use it if her blood pressure exceeds 140 mmHg. She mentions having a limited supply of this medication left. She has not had any recent checks for blood clots.   Past Medical History:   Past Medical History:  Diagnosis Date  . Allergic rhinitis 05/16/2021  . Anatomical narrow angle   . Cataract cortical, senile    OD  . Elevated cholesterol   . Glaucoma (increased eye pressure)   . Hemorrhagic stroke (CMS/HHS-HCC) 11/1988  . Hypertension   . Laryngopharyngeal reflux disease 07/31/2018  . Pulmonary fibrosis (CMS/HHS-HCC) 05/08/2021    Past Surgical History:   Past Surgical History:  Procedure Laterality Date   . HYSTERECTOMY TOTAL ABDOMINAL W/REMOVAL TUBES &/OR OVARIES  1982  . GLAUCOMA EYE SURGERY Left 11/17/2013   combo w/mmc  . LENS EYE SURGERY Left 11/17/2013   combo w/mmc  . LENS EYE SURGERY Left 02/12/2014   YAG  . TRABECULECTOMY AB EXTERNO Right 07/06/2015   Procedure: FISTULIZATION OF SCLERA FOR GLAUCOMA; TRABECULECTOMY AB EXTERNO IN ABSENCE OF PREVIOUS SURGERY;  Surgeon: Patsi Him, MD;  [facility];  Service: Ophthalmology;  Laterality: Right;  PAT phone call 1 to 5 days prior to surgery  . Bunion both feet    . REPAIR RECTOCELE      Allergies:   Allergies  Allergen Reactions  . Bee Venom Protein (Honey Bee) Anaphylaxis and Hives  . Shellfish Derived Anaphylaxis and Hives    Mainly shrimp  . Venom-Honey Bee Anaphylaxis and Hives  . Alphagan P [Brimonidine] Other (See Comments)    Dry mouth  . Amlodipine  Other (See Comments)    2.5 leg edema  . Azopt [Brinzolamide] Other (See Comments)    Blurry vision  . Codeine Hallucination    Altered mental status  . Combigan [Brimonidine-Timolol] Other (See Comments)    Dry mouth  . Morphine Hallucination    Altered mental status  . Sulfamethoxazole-Trimethoprim Other (See Comments)    ? Reaction  .  Nitrofurantoin Monohyd/M-Cryst Rash    Current Medications:   Prior to Admission medications  Medication Sig Taking? Last Dose  acetaminophen  (TYLENOL ) 325 MG tablet Take 650 mg by mouth every 6 (six) hours as needed Yes Taking  albuterol  MDI, PROVENTIL , VENTOLIN , PROAIR , HFA 90 mcg/actuation inhaler Inhale 2 inhalations into the lungs every 6 (six) hours as needed for Wheezing Yes Taking  aspirin  81 MG chewable tablet Take 81 mg by mouth once daily. Yes Taking  atorvastatin  (LIPITOR) 20 MG tablet Take 20 mg by mouth once daily.   Yes Taking  calcium  carbonate-vitamin D3 (CALTRATE 600+D) 600 mg(1,500mg ) -400 unit tablet Take 1 tablet by mouth once daily. Yes Taking  cholecalciferol  (VITAMIN D3) 2,000 unit tablet  Take 2,000 Units by mouth once daily Yes Taking  ezetimibe  (ZETIA ) 10 mg tablet Take 10 mg by mouth every other day Yes Taking  flaxseed oil Oil Use 1,400 mg Yes Taking  ipratropium-albuteroL  (DUO-NEB) nebulizer solution Take 3 mLs by nebulization every 6 (six) hours as needed for Wheezing or Shortness of Breath Yes Taking  losartan  (COZAAR ) 25 MG tablet Take 25 mg by mouth 2 (two) times daily Yes Taking  multivitamin tablet Take 1 tablet by mouth once daily. Yes Taking  omega-3 fatty acids/fish oil 340-1,000 mg capsule Take 1 capsule by mouth 4 (four) times daily Yes Taking  pantoprazole  (PROTONIX ) 20 MG DR tablet Take 1 tablet (20 mg total) by mouth 2 (two) times daily before meals Yes Taking  prednisoLONE acetate (PRED FORTE) 1 % ophthalmic suspension Place 1 drop into the right eye 4 (four) times daily. Yes Taking  TORsemide  (DEMADEX ) 20 MG tablet Take 1 tablet (20 mg total) by mouth every other day Only for 3 days Yes Taking  albuterol  (PROVENTIL ) 2.5 mg /3 mL (0.083 %) nebulizer solution Take 3 mLs (2.5 mg total) by nebulization every 6 (six) hours as needed for Wheezing Patient not taking: Reported on 09/20/2023  Not Taking  azithromycin  (ZITHROMAX ) 250 MG tablet Take 1 tablet (250 mg total) by mouth every Monday, Wednesday, and Friday for 182 days    ciprofloxacin  HCl (CILOXAN ) 0.3 % ophthalmic solution Place 1 drop into the right eye 4 (four) times daily. Start after surgery Patient not taking: Reported on 08/15/2023  Not Taking  montelukast (SINGULAIR) 10 mg tablet Take 1 tablet (10 mg total) by mouth at bedtime for 90 days    predniSONE  (DELTASONE ) 2.5 MG tablet Take 1 tablet (2.5 mg total) by mouth once daily    sulfamethoxazole-trimethoprim (BACTRIM DS) 800-160 mg tablet Take 1 tablet (160 mg of trimethoprim total) by mouth every Monday, Wednesday, and Friday for 78 doses      Family History:   Family History  Problem Relation Name Age of Onset  . Glaucoma Maternal Aunt    .  Glaucoma Cousin    . Vision loss Neg Hx    . Blindness Neg Hx    . Macular degeneration Neg Hx    . Anesthesia problems Neg Hx      Social History:   Social History   Socioeconomic History  . Marital status: Widowed  Tobacco Use  . Smoking status: Never  . Smokeless tobacco: Never  Substance and Sexual Activity  . Alcohol use: No  . Drug use: No   Social Drivers of Corporate investment banker Strain: Low Risk  (07/24/2023)   Received from [facility]   Overall Financial Resource Strain (CARDIA)   . How hard is it for you  to pay for the very basics like food, housing, medical care, and heating?: Not hard at all  Food Insecurity: No Food Insecurity (07/24/2023)   Received from [facility]   Hunger Vital Sign   . Within the past 12 months, you worried that your food would run out before you got the money to buy more.: Never true   . Within the past 12 months, the food you bought just didn't last and you didn't have money to get more.: Never true  Transportation Needs: No Transportation Needs (07/24/2023)   Received from [facility] - Transportation   . In the past 12 months, has lack of transportation kept you from medical appointments or from getting medications?: No   . In the past 12 months, has lack of transportation kept you from meetings, work, or from getting things needed for daily living?: No  Physical Activity: Sufficiently Active (07/24/2023)   [facility]   Exercise Vital Sign   . On average, how many days per week do you engage in moderate to strenuous exercise (like a brisk walk)?: 6 days   . On average, how many minutes do you engage in exercise at this level?: 50 min  Stress: No Stress Concern Present (07/24/2023)   Received from [facility] of Occupational [facility] - Occupational Stress Questionnaire   . Do you feel stress - tense, restless, nervous, or anxious, or unable to sleep at night because your mind is troubled all  the time - these days?: Not at all  Social Connections: Moderately Integrated (07/24/2023)   Received from [facility]   Social Connection and Isolation Panel   . In a typical week, how many times do you talk on the phone with family, friends, or neighbors?: More than three times a week   . How often do you get together with friends or relatives?: More than three times a week   . How often do you attend church or religious services?: More than 4 times per year   . Do you belong to any clubs or organizations such as church groups, unions, fraternal or athletic groups, or school groups?: Yes   . How often do you attend meetings of the clubs or organizations you belong to?: More than 4 times per year   . Are you married, widowed, divorced, separated, never married, or living with a partner?: Widowed  Housing Stability: Low Risk  (07/01/2023)   Housing Stability Vital Sign   . Unable to Pay for Housing in the Last Year: No   . Number of Times Moved in the Last Year: 0   . Homeless in the Last Year: No    Review of Systems:   A 10 point review of systems is negative, except for the pertinent positives and negatives detailed in the HPI.  Vitals:   Vitals:   09/20/23 1046  BP: (!) 161/87  BP Location: Left upper arm  Patient Position: Sitting  BP Cuff Size: Small Adult  Pulse: 71  Temp: 36.7 C (98 F)  SpO2: 98%  Weight: 46.5 kg (102 lb 9.6 oz)  Height: 157.5 cm (5' 2)     Body mass index is 18.77 kg/m.  Physical Exam:   Physical Exam Vitals and nursing note reviewed.  Constitutional:      General: in no acute distress.    Appearance: Normal appearance. Is not ill-appearing, toxic-appearing or diaphoretic.  HENT:     Head: Normocephalic and  atraumatic.     Right Ear: External ear normal.     Left Ear: External ear normal.  Eyes:     General:        Right eye: No discharge.        Left eye: No discharge.     Extraocular Movements: Extraocular movements intact.      Pupils: Pupils are equal, round, and reactive to light.  Cardiovascular:     Rate and Rhythm: Normal rate and regular rhythm.     Pulses: Normal pulses.     Heart sounds: Normal heart sounds. No murmur heard.    No friction rub. No gallop.  Abdominal:     General: Bowel sounds are normal.  Skin:    General: Skin is warm and dry.     Capillary Refill: Capillary refill takes less than 2 seconds.  Neurological:     Mental Status: Patient is alert.     Lab and Imaging Results:   Results LABS Blood Glucose: hypoglycemic Kidney Function: within normal limits    Assessment and Plan:   Diagnoses and all orders for this visit:  Flash pulmonary edema (CMS/HHS-HCC) -     D-dimer ([facility]) -     C-Reactive Protein, Quant - [facility]  Positive D-dimer -     CT chest PE protocol incl CT angiogram chest w wo contrast; Future  Other orders -     predniSONE  (DELTASONE ) 2.5 MG tablet; Take 1 tablet (2.5 mg total) by mouth once daily -     sulfamethoxazole-trimethoprim (BACTRIM DS) 800-160 mg tablet; Take 1 tablet (160 mg of trimethoprim total) by mouth every Monday, Wednesday, and Friday for 78 doses -     azithromycin  (ZITHROMAX ) 250 MG tablet; Take 1 tablet (250 mg total) by mouth every Monday, Wednesday, and Friday for 182 days    Assessment & Plan Pulmonary fibrosis with acute exacerbation Pulmonary fibrosis with acute exacerbation, presenting with sudden onset dyspnea and hypertension. Differential diagnosis includes pulmonary embolism due to increased risk associated with pulmonary fibrosis. Symptoms improved with previous steroid and antibiotic therapy. - Order blood work to check for inflammation and blood clots - Order CT scan with contrast to evaluate for pulmonary embolism, assess scar tissue, and check for pneumonias or other changes - Reinitiate low-dose prednisone  and antibiotics if needed, based on previous positive response  Dyspnea Symptoms improved with previous steroid  and antibiotic therapy. - Reinitiate low-dose prednisone  and antibiotics if needed, based on previous positive response  Acute pulmonary edema with severe hypertension Acute pulmonary edema likely secondary to severe hypertension, with blood pressure recorded at 218 mmHg. Previous episodes of hypertension noted only during ER visits. Current blood pressure not as elevated, and diuretic therapy initiated by another provider. - Continue current diuretic therapy as prescribed by Dr. Hope     I spent a total of 41 minutes in both face-to-face and non-face-to-face activities, excluding procedures performed, for this visit on the date of this encounter.   This note has been created using dictation software tool and any typographical errors are purely unintentional.  Patient received an After Visit Summary

## 2023-09-23 ENCOUNTER — Ambulatory Visit
Admission: RE | Admit: 2023-09-23 | Discharge: 2023-09-23 | Disposition: A | Discharge status: Home / Self Care | Source: Ambulatory Visit | Attending: Pulmonary Disease | Admitting: Pulmonary Disease

## 2023-09-23 ENCOUNTER — Other Ambulatory Visit: Payer: Self-pay | Admitting: Pulmonary Disease

## 2023-09-23 DIAGNOSIS — J810 Acute pulmonary edema: Secondary | ICD-10-CM | POA: Diagnosis not present

## 2023-09-23 DIAGNOSIS — R7989 Other specified abnormal findings of blood chemistry: Secondary | ICD-10-CM

## 2023-09-23 DIAGNOSIS — R911 Solitary pulmonary nodule: Secondary | ICD-10-CM | POA: Diagnosis not present

## 2023-09-23 DIAGNOSIS — L814 Other melanin hyperpigmentation: Secondary | ICD-10-CM | POA: Diagnosis not present

## 2023-09-23 DIAGNOSIS — R0602 Shortness of breath: Secondary | ICD-10-CM | POA: Diagnosis not present

## 2023-09-23 DIAGNOSIS — L821 Other seborrheic keratosis: Secondary | ICD-10-CM | POA: Diagnosis not present

## 2023-09-23 DIAGNOSIS — J849 Interstitial pulmonary disease, unspecified: Secondary | ICD-10-CM | POA: Diagnosis not present

## 2023-09-23 MED ORDER — IOHEXOL 350 MG/ML SOLN
75.0000 mL | Freq: Once | INTRAVENOUS | Status: AC | PRN
  Administered 2023-09-23: 75 mL via INTRAVENOUS

## 2023-09-26 ENCOUNTER — Ambulatory Visit (INDEPENDENT_AMBULATORY_CARE_PROVIDER_SITE_OTHER)

## 2023-09-26 ENCOUNTER — Other Ambulatory Visit: Payer: Self-pay | Admitting: *Deleted

## 2023-09-26 ENCOUNTER — Ambulatory Visit: Payer: Self-pay

## 2023-09-26 VITALS — BP 120/90 | HR 65 | Temp 98.1°F | Ht 61.0 in | Wt 103.2 lb

## 2023-09-26 DIAGNOSIS — R0602 Shortness of breath: Secondary | ICD-10-CM | POA: Diagnosis not present

## 2023-09-26 DIAGNOSIS — I10 Essential (primary) hypertension: Secondary | ICD-10-CM

## 2023-09-26 DIAGNOSIS — R7989 Other specified abnormal findings of blood chemistry: Secondary | ICD-10-CM | POA: Insufficient documentation

## 2023-09-26 DIAGNOSIS — E871 Hypo-osmolality and hyponatremia: Secondary | ICD-10-CM

## 2023-09-26 DIAGNOSIS — I8393 Asymptomatic varicose veins of bilateral lower extremities: Secondary | ICD-10-CM | POA: Diagnosis not present

## 2023-09-26 DIAGNOSIS — J849 Interstitial pulmonary disease, unspecified: Secondary | ICD-10-CM | POA: Diagnosis not present

## 2023-09-26 DIAGNOSIS — J8410 Pulmonary fibrosis, unspecified: Secondary | ICD-10-CM | POA: Diagnosis not present

## 2023-09-26 LAB — COMPREHENSIVE METABOLIC PANEL WITH GFR
ALT: 13 U/L (ref 0–35)
AST: 17 U/L (ref 0–37)
Albumin: 4 g/dL (ref 3.5–5.2)
Alkaline Phosphatase: 91 U/L (ref 39–117)
BUN: 12 mg/dL (ref 6–23)
CO2: 27 meq/L (ref 19–32)
Calcium: 9.5 mg/dL (ref 8.4–10.5)
Chloride: 94 meq/L — ABNORMAL LOW (ref 96–112)
Creatinine, Ser: 0.66 mg/dL (ref 0.40–1.20)
GFR: 77.18 mL/min (ref >60.00)
Glucose, Bld: 89 mg/dL (ref 70–99)
Potassium: 4 meq/L (ref 3.5–5.1)
Sodium: 130 meq/L — ABNORMAL LOW (ref 135–145)
Total Bilirubin: 0.5 mg/dL (ref 0.2–1.2)
Total Protein: 6.6 g/dL (ref 6.0–8.3)

## 2023-09-26 LAB — TSH: TSH: 1.96 u[IU]/mL (ref 0.35–5.50)

## 2023-09-26 LAB — T4, FREE: Free T4: 0.96 ng/dL (ref 0.60–1.60)

## 2023-09-26 MED ORDER — TORSEMIDE 10 MG PO TABS: 10.0000 mg | ORAL_TABLET | Freq: Every day | ORAL | 1 refills | Status: DC

## 2023-09-26 NOTE — Progress Notes (Signed)
 Pt notified via MyChart.

## 2023-09-26 NOTE — Patient Instructions (Addendum)
-   Check your weight daily, if your weight goes up by more than 2 kg in 3 days or 4.5 pounds and or symptoms of shortness of breath, swelling in legs you should be seen in the ED.   - I am starting you on water pill called Torsemide , take 10 mg once daily in the morning. Watch for signs of feeling dizzy, weak and let me know if you were to have these symptoms. We may need to adjust this medication to every other day if these symptoms were to occur. I have reached out to your cardiologist as well. I believe you will benefit from getting repeat echocardiogram to evaluate structural abnormality and left ventricle function to make sure heart is not getting strain from your chronic lung disease.   - Check BP daily and keep a BP log. As long as your BP on average is less 140-150 top number and bottom number on average is 90 or lower I don't recommend changing BP medication.   - If your sodium continues to be borderline low, I will monitor this but it is lower than 125 I recommend we have you see a nephrologist.

## 2023-09-26 NOTE — Assessment & Plan Note (Signed)
 Was seen in ER for this on 09/14/23,  BP was 190/89, serum sodium was 127, BNP 110m alb 3.4, X-ray:chronic interstit lung disease, o2 sats in 90s, respiratory panel was negative.  CT angio to r/o PE after having mild elevation in D-Dimer from 09/23/23: 1. No evidence acute pulmonary embolism. 2. Chronic interstitial lung disease. 3. Stable nodularity in the RIGHT upper lobe.  Currently asymptomatic and felt better with restarting Prednisone . Continue f/u with pulmonology.  Recommend repeat echocardiogram, f/u with cardiology.  Check CMP, TSH, T4 today.  Start Torsemide  10 mg daily. F/U in 2 months.

## 2023-09-26 NOTE — Progress Notes (Signed)
 ED follow up, [facility] 09/14/2023    Subjective  Patient ID: Wanda Nelson, female    DOB: [birth year]  Age: 89 y.o. MRN: [id number]  Chief Complaint  Patient presents with   Hospitalization Follow-up   Hypertension    She  has a past medical history of Actinic keratosis, Acute diverticulitis (08/17/2022), Adenomatous polyp of colon (10/23/2022), Anemia, Aortic atherosclerosis (HCC), Atrial myxoma (03/17/2018), Basal cell carcinoma (05/17/2021), Cardiomegaly, Complication of anesthesia, Diastolic dysfunction, Diverticulosis, Dyspnea, Essential hypertension, Fatigue (12/19/2021), GERD (gastroesophageal reflux disease), Glaucoma, Hemorrhagic cerebrovascular accident (CVA) (HCC) (1990), Hiatal hernia s/p repair 11/2021, History of 2019 novel coronavirus disease (COVID-19), History of blood transfusion, History of diverticulitis, History of left atrial appendage closure (03/21/2018), Hyperlipidemia, Hypoxia, Incidental pulmonary nodule, > 3mm and < 8mm (03/20/2018), Long-term use of aspirin  therapy, Lower GI bleed (08/17/2022), Lung fibrosis (HCC) (11/16/2020), Nodule of upper lobe of right lung, NSVT (nonsustained ventricular tachycardia) (HCC) (05/13/2018), PAF (paroxysmal atrial fibrillation) (HCC) (05/13/2018), Pain due to onychomycosis of toenails of both feet (07/28/2020), Paraesophageal hernia, PONV (postoperative nausea and vomiting), PSVT (paroxysmal supraventricular tachycardia) (HCC) (05/13/2018), Pulmonary fibrosis (HCC), Pulmonary hypertension (HCC), S/P repair of paraesophageal hernia (11/14/2021), Sinus bradycardia, Squamous cell carcinoma of skin (11/12/2019), Vitamin D  deficiency (01/02/2023), and Weight loss, non-intentional (04/15/2022).  HPI Patient with known h/o pulmonary fibrosis, Myxoma s/p resection, CAD s/p CABG, HTN, hyperlipidemia presents for ED follow up. Patient was seen at [facility] ED on 09/14/23 for evaluation of shortness of breath a/w elevated BP. EMS were called, she was  found to have SBP over 200 mmHg. In the ED her BP continued to be high 190/89 mmHg. She was found to have hyponatremia. Patient was treated with once dose of Clonidine . She was recommended to take Hydrochlorothiazide  12.5 mg  prn which was previously prescribed by her previous PCP Dr. Hope.   Since ED visit she is following up with pulmonologist Dr. Parris closely, has started on low dose Prednisone  and Azithromycin  and has noted improvement in symptoms.   She has lower legs varicose veins that she is concerned about.   ROS As per HPI    Objective:     BP (!) 120/90 (BP Location: Right Arm, Patient Position: Sitting, Cuff Size: Small)   Pulse 65   Temp 98.1 F (36.7 C) (Oral)   Ht 5' 1 (1.549 m)   Wt 103 lb 3.2 oz (46.8 kg)   LMP  (LMP Unknown)   SpO2 94%   BMI 19.50 kg/m      09/26/2023    8:16 AM 08/14/2023   11:10 AM 07/24/2023   11:03 AM  Depression screen PHQ 2/9  Decreased Interest 0 0 0  Down, Depressed, Hopeless 0 0 0  PHQ - 2 Score 0 0 0  Altered sleeping 0 0 0  Tired, decreased energy 0 1 0  Change in appetite 0 0 0  Feeling bad or failure about yourself  0 0 0  Trouble concentrating 0 0 0  Moving slowly or fidgety/restless 0 0 0  Suicidal thoughts 0 0 0  PHQ-9 Score 0 1 0  Difficult doing work/chores Not difficult at all Somewhat difficult Not difficult at all      09/26/2023    8:16 AM 08/14/2023   11:10 AM 07/02/2023    3:13 PM 05/01/2023   11:12 AM  GAD 7 : Generalized Anxiety Score  Nervous, Anxious, on Edge 0 0 0 0  Control/stop worrying 0 0 0 0  Worry too much - different things  0 0 0 0  Trouble relaxing 0 0 0 0  Restless 0 0 0 0  Easily annoyed or irritable 0 0 0 0  Afraid - awful might happen 0 0 0 0  Total GAD 7 Score 0 0 0 0  Anxiety Difficulty Not difficult at all Not difficult at all Not difficult at all Not difficult at all      09/26/2023    8:16 AM 08/14/2023   11:10 AM 07/24/2023   11:03 AM  Depression screen PHQ 2/9  Decreased  Interest 0 0 0  Down, Depressed, Hopeless 0 0 0  PHQ - 2 Score 0 0 0  Altered sleeping 0 0 0  Tired, decreased energy 0 1 0  Change in appetite 0 0 0  Feeling bad or failure about yourself  0 0 0  Trouble concentrating 0 0 0  Moving slowly or fidgety/restless 0 0 0  Suicidal thoughts 0 0 0  PHQ-9 Score 0 1 0  Difficult doing work/chores Not difficult at all Somewhat difficult Not difficult at all      09/26/2023    8:16 AM 08/14/2023   11:10 AM 07/02/2023    3:13 PM 05/01/2023   11:12 AM  GAD 7 : Generalized Anxiety Score  Nervous, Anxious, on Edge 0 0 0 0  Control/stop worrying 0 0 0 0  Worry too much - different things 0 0 0 0  Trouble relaxing 0 0 0 0  Restless 0 0 0 0  Easily annoyed or irritable 0 0 0 0  Afraid - awful might happen 0 0 0 0  Total GAD 7 Score 0 0 0 0  Anxiety Difficulty Not difficult at all Not difficult at all Not difficult at all Not difficult at all   SDOH Screenings   Food Insecurity: No Food Insecurity (07/24/2023)  Housing: Unknown (07/24/2023)  Transportation Needs: No Transportation Needs (07/24/2023)  Utilities: Not At Risk (07/24/2023)  Alcohol Screen: Low Risk  (07/24/2023)  Depression (PHQ2-9): Low Risk  (09/26/2023)  Financial Resource Strain: Low Risk  (07/24/2023)  Physical Activity: Sufficiently Active (07/24/2023)  Social Connections: Moderately Integrated (07/24/2023)  Stress: No Stress Concern Present (07/24/2023)  Tobacco Use: Low Risk  (09/26/2023)  Health Literacy: Adequate Health Literacy (07/24/2023)     Physical Exam Constitutional:      General: She is not in acute distress. HENT:     Head: Normocephalic and atraumatic.     Mouth/Throat:     Mouth: Mucous membranes are moist.     Pharynx: Oropharynx is clear.  Eyes:     Conjunctiva/sclera: Conjunctivae normal.  Cardiovascular:     Rate and Rhythm: Normal rate.     Pulses: Normal pulses.  Pulmonary:     Effort: Pulmonary effort is normal.     Breath sounds: Normal breath  sounds. No wheezing.  Abdominal:     Palpations: Abdomen is soft.  Musculoskeletal:     Cervical back: Neck supple.     Right lower leg: No edema.     Left lower leg: No edema.     Comments: B/l varicose veins on lower legs noted without edema, erythema or swelling   Skin:    General: Skin is warm.  Neurological:     Mental Status: She is alert and oriented to person, place, and time.        No results found for any visits on 09/26/23.  The ASCVD Risk score (Arnett DK, et al., 2019) failed to calculate for  the following reasons:   The 2019 ASCVD risk score is only valid for ages 73 to 32     Assessment & Plan:   Shortness of breath Assessment & Plan: Was seen in ER for this on 09/14/23,  BP was 190/89, serum sodium was 127, BNP 131m alb 3.4, X-ray:chronic interstit lung disease, o2 sats in 90s, respiratory panel was negative.  CT angio to r/o PE after having mild elevation in D-Dimer from 09/23/23: 1. No evidence acute pulmonary embolism. 2. Chronic interstitial lung disease. 3. Stable nodularity in the RIGHT upper lobe.  Currently asymptomatic and felt better with restarting Prednisone . Continue f/u with pulmonology.  Recommend repeat echocardiogram, f/u with cardiology.  Check CMP, TSH, T4 today.  Start Torsemide  10 mg daily. F/U in 2 months.    Primary hypertension Assessment & Plan: BP within goal today (goal 140/90 or less). Currently on Losartan  25 mg BID, was previously prescribed hydrochlorothiazide  12.5 mg by previous PCP which was discontinued due to hyponatremia. Patient was talking Hydrochlorothiazide  12.5 mg till she ran out of her prescription before her appointment with me today.  Recent ED visit for uncontrolled BP a/w with sob d/d includes flash pulmonary edema, cardiac strain from ILD. Home BP reviewed which has been overall within goal. Patient is feeling better since starting Prednisone  2.5 mg and Azithromycin  by pulmonology.    I recommend trial of  Torsemide  10 mg daily when she waits to f/u with cardiology. I also recommend repeat echocardiogram to evaluate left ventricle function. Discussed s/e including electrolytes abnormality, urinary frequency. If symptoms occurs recommends patient reach out to us  and will consider every other day therapy. Continue Losartan  25 mg BID. Hesitant to start BB given pulmonary history, appreciate cardiology input. Will check CMP today. F/U in 2 months or sooner.   Monitor blood pressure daily. Seek emergency care if systolic is over 799 mmHg or diastolic is over 899 mmHg with symptoms such as shortness of breath, headache, or chest pain.   Hyponatremia Assessment & Plan: Likely SIADH given significant pulmonary h/o ILD. Patient euvolemic, asymptomatic. Check CMP, TSH, t4 today.  If NA 125 or hight will continue to monitor labs periodically. If <125 will recommend nephrology evaluation.   Orders: -     TSH -     T4, free -     Comprehensive metabolic panel with GFR  High serum triiodothyronine (T3) Assessment & Plan: Noted on previous lab, given hyponatremia, (suspicious due to SIADH) will make sure TSH, T4 is normal. Repeat labs ordered.     ILD (interstitial lung disease) (HCC) Assessment & Plan: CT angio to r/o PE after having mild elevation in D-Dimer from 09/23/23: 1. No evidence acute pulmonary embolism. 2. Chronic interstitial lung disease. 3. Stable nodularity in the RIGHT upper lobe.    Asymptomatic varicose veins of both lower extremities Assessment & Plan: Lifestyle Modifications: Advise on leg elevation, and continue regular exercise. Compression Therapy: Recommend 25 mmHg compression stockings. Follow-Up: Schedule follow-up appointment in 2 months to assess.  Referral: Consider referral to a vascular specialist for further evaluation and management if symptoms persist or worsen.    Other orders -     Torsemide ; Take 1 tablet (10 mg total) by mouth daily.  Dispense: 90 tablet;  Refill: 1   Return in about 8 weeks (around 11/21/2023) for BP, hyponatremia f/u .   Luke Shade, MD

## 2023-09-26 NOTE — Assessment & Plan Note (Signed)
 Noted on previous lab, given hyponatremia, (suspicious due to SIADH) will make sure TSH, T4 is normal. Repeat labs ordered.

## 2023-09-26 NOTE — Assessment & Plan Note (Signed)
 BP within goal today (goal 140/90 or less). Currently on Losartan  25 mg BID, was previously prescribed hydrochlorothiazide  12.5 mg by previous PCP which was discontinued due to hyponatremia. Patient was talking Hydrochlorothiazide  12.5 mg till she ran out of her prescription before her appointment with me today.  Recent ED visit for uncontrolled BP a/w with sob d/d includes flash pulmonary edema, cardiac strain from ILD. Home BP reviewed which has been overall within goal. Patient is feeling better since starting Prednisone  2.5 mg and Azithromycin  by pulmonology.    I recommend trial of Torsemide  10 mg daily when she waits to f/u with cardiology. I also recommend repeat echocardiogram to evaluate left ventricle function. Discussed s/e including electrolytes abnormality, urinary frequency. If symptoms occurs recommends patient reach out to us  and will consider every other day therapy. Continue Losartan  25 mg BID. Hesitant to start BB given pulmonary history, appreciate cardiology input. Will check CMP today. F/U in 2 months or sooner.   Monitor blood pressure daily. Seek emergency care if systolic is over 799 mmHg or diastolic is over 899 mmHg with symptoms such as shortness of breath, headache, or chest pain.

## 2023-09-26 NOTE — Assessment & Plan Note (Signed)
 CT angio to r/o PE after having mild elevation in D-Dimer from 09/23/23: 1. No evidence acute pulmonary embolism. 2. Chronic interstitial lung disease. 3. Stable nodularity in the RIGHT upper lobe.

## 2023-09-26 NOTE — Assessment & Plan Note (Signed)
 Likely SIADH given significant pulmonary h/o ILD. Patient euvolemic, asymptomatic. Check CMP, TSH, t4 today.  If NA 125 or hight will continue to monitor labs periodically. If <125 will recommend nephrology evaluation.

## 2023-09-26 NOTE — Assessment & Plan Note (Signed)
 Lifestyle Modifications: Advise on leg elevation, and continue regular exercise. Compression Therapy: Recommend 25 mmHg compression stockings. Follow-Up: Schedule follow-up appointment in 2 months to assess.  Referral: Consider referral to a vascular specialist for further evaluation and management if symptoms persist or worsen.

## 2023-09-26 NOTE — Progress Notes (Signed)
 Please let the patient know her sodium is low but stable. I will continue to monitor this. I don't recommend referral to nephrologist at this time. Your thyroid  hormone looks good.   Luke Shade, MD

## 2023-10-01 NOTE — Progress Notes (Signed)
Pt reviewed results via MyChart.  

## 2023-11-01 DIAGNOSIS — Z23 Encounter for immunization: Secondary | ICD-10-CM | POA: Diagnosis not present

## 2023-11-14 ENCOUNTER — Ambulatory Visit: Attending: Cardiovascular Disease

## 2023-11-14 DIAGNOSIS — R0602 Shortness of breath: Secondary | ICD-10-CM | POA: Insufficient documentation

## 2023-11-14 LAB — ECHOCARDIOGRAM COMPLETE
AR max vel: 2.19 cm2
AV Area VTI: 2.11 cm2
AV Area mean vel: 2.16 cm2
AV Mean grad: 3 mmHg
AV Peak grad: 4.8 mmHg
Ao pk vel: 1.09 m/s
Area-P 1/2: 2.95 cm2
S' Lateral: 2.5 cm

## 2023-11-21 ENCOUNTER — Encounter: Payer: Self-pay | Admitting: Physician Assistant

## 2023-11-21 ENCOUNTER — Ambulatory Visit: Attending: Physician Assistant | Admitting: Physician Assistant

## 2023-11-21 VITALS — BP 130/74 | HR 78 | Ht 65.0 in | Wt 102.2 lb

## 2023-11-21 DIAGNOSIS — I4891 Unspecified atrial fibrillation: Secondary | ICD-10-CM | POA: Diagnosis not present

## 2023-11-21 DIAGNOSIS — I5189 Other ill-defined heart diseases: Secondary | ICD-10-CM | POA: Diagnosis not present

## 2023-11-21 DIAGNOSIS — E785 Hyperlipidemia, unspecified: Secondary | ICD-10-CM | POA: Insufficient documentation

## 2023-11-21 DIAGNOSIS — I9789 Other postprocedural complications and disorders of the circulatory system, not elsewhere classified: Secondary | ICD-10-CM | POA: Insufficient documentation

## 2023-11-21 DIAGNOSIS — I10 Essential (primary) hypertension: Secondary | ICD-10-CM | POA: Diagnosis not present

## 2023-11-21 DIAGNOSIS — D151 Benign neoplasm of heart: Secondary | ICD-10-CM | POA: Insufficient documentation

## 2023-11-21 NOTE — Progress Notes (Signed)
 Cardiology Office Note    Date:  11/21/2023   ID:  Ceylin, Dreibelbis 06-Feb-1931, MRN [id number]  PCP:  Abbey Bruckner, MD  Cardiologist:  Deatrice Cage, MD  Electrophysiologist:  None   Chief Complaint: Follow up  History of Present Illness:   Wanda Nelson is a 89 y.o. female with history of anemia, hiatal hernia, hyperlipidemia, hemorrhagic stroke, s/p resection of large left atrial myxoma and LA appendage clipping 03/2018, and post op atrial fibrillation who presents for follow up on atrial myxoma s/p resection and post op afib.     Patient large left atrial myxoma clipping complete atrial appendage 03/2018.  Cardiac catheterization prior to surgery showed no evidence of obstructive CAD.  She had intermittent palpitations and tachycardia shortly after surgery due to short runs of NSVT and intermittent atrial fibrillation with RVR.  This did not require treatment and her symptoms resolved without intervention.  Echo 12/2018 showed normal LV systolic function with no significant valvular abnormalities.  There was concern about possible recurrent atrial mass.  However, it appeared to be prominent scarring at the surgical site.  Echo 06/2019 showed no evidence of mass.  EF was 50 to 55%.  Patient was hospitalized in 2023 for shortness of breath.  She underwent an echocardiogram which showed normal LV systolic function with mild LVH and stable mild to moderate tricuspid regurgitation.  CT of the chest showed evidence of pulmonary fibrosis.  Dyspnea was felt to be noncardiac.  She was evaluated by pulmonary and dyspnea was felt to be due to pulmonary fibrosis from large lateral hernia causing chronic reflux.  She underwent hiatal hernia surgery 11/2021.   Patient was seen in the emergency department 09/14/2023 with worsening shortness of breath over the past day.  This was worse with exertion.  Blood pressure was noted to be elevated at 190/89 with otherwise normal vital signs.  Labs  were largely unremarkable including negative troponin.  BNP 185.  Respiratory panel negative.  EKG without acute ischemic changes.  Overall symptoms improved and patient declined nebulizer treatment.  She was given clonidine  0.1 mg for elevated blood pressure.  She saw pulmonology in follow-up 9/12 who ordered D-dimer which was positive.  Subsequent CT angiogram negative for PE.  Seen by PCP 9/18 and was prescribed torsemide  10 mg daily.  Echo 11/14/2023 showed EF 60 to 65%, mild asymmetric LVH, G2 DD, mildly reduced RV systolic function, mild MR, and moderate TR.  Patient presents today overall doing well from a cardiac perspective without symptoms of angina and cardiac decompensation.  She reports fluctuations in her blood pressure per home readings with mildly elevated measurement today.  She is scheduled to see her PCP tomorrow to discuss further medication changes.  We discussed results of recent echo which showed G2 DD which is new from prior echo.  She was recently started on torsemide  by her PCP and has been tolerating this well.  She denies chest pain, palpitations, lightheadedness, dizziness, orthopnea, and lower extremity swelling.  Labs independently reviewed: 09/2023-sodium 130, potassium 4.0, BUN 12, creatinine 0.66, normal LFTs, TSH/T4 wnl, Hgb 14.2, HCT 41.3, platelets 153  Objective   Past Medical History:  Diagnosis Date   Actinic keratosis    Acute diverticulitis 08/17/2022   Adenomatous polyp of colon 10/23/2022   Anemia    Aortic atherosclerosis    Atrial myxoma 03/17/2018   a.) TTE 03/17/2018 --> 2.4 x 2.5 cm LEFT atrial mass attached at fossa ovalis/atrial septum; pedunculated in shape and  solid in texture; b.) s/p atrial myoxma resection 03/21/2018; initially attempted via minimally invasive RIGHT thoracotomy approach, however due to bleeding complications, procedure required median sternotomy and patient going on cardiopulmonary bypass   Basal cell carcinoma 05/17/2021   L  upper back 5.0 cm lat to spine - ED&C   Cardiomegaly    Complication of anesthesia    a.) severe PONV   Diastolic dysfunction    a.) TTE 03/17/2018: EF 55-60%, LVH, mild PR, G1DD; b.) TTE 07/25/2018: EF 60-65%, RVSP 44.9, mild-mod MR/TR, G1DD; c.) TTE 12/18/2018: EF 60-65%; d.) TTE 06/12/2019: EF 50-55, mild BAE, triv TR, PASP 37.5, G2DD; e.) TTE 07/22/2020: EF 60-65%, mild MR, mod TR, RVSP 42.7, G1DD; f.) TTE 05/08/2021: EF 60-65%, mild LVH, mild-mod TR, AoV sclerosis without stenosis   Diverticulosis    Dyspnea    Essential hypertension    Fatigue 12/19/2021   GERD (gastroesophageal reflux disease)    Glaucoma    Hemorrhagic cerebrovascular accident (CVA) (HCC) 1990   Hiatal hernia s/p repair 11/2021    History of 2019 novel coronavirus disease (COVID-19)    a.) 05/30/20 - Tx'd with covid 19 pill; b.) home Ag testing (+) 10/2021   History of blood transfusion    History of diverticulitis    History of left atrial appendage closure 03/21/2018   a.) #45 Atricure   Hyperlipidemia    Hypoxia    Incidental pulmonary nodule, > 3mm and < 8mm 03/20/2018   Noted on CT scan - needs f/u imaging 6 months   Long-term use of aspirin  therapy    Lower GI bleed 08/17/2022   Lung fibrosis (HCC) 11/16/2020   Nodule of upper lobe of right lung    NSVT (nonsustained ventricular tachycardia) (HCC) 05/13/2018   a.) holter 05/13/2018 --> 5 short runs NSVT   PAF (paroxysmal atrial fibrillation) (HCC) 05/13/2018   a.) holter 05/13/2018 --> intermittent episodes with longest lasting approx 1.5 hours (burden <1%); b.) CHA2DS2VASc = 7 (age x2, sex, HTN, CVA x2, vascular disease); c.) s/p LAA closure 03/21/2018; d.) rate/rhythm now maintained intrinsically without pharmacological intervention; no OAC   Pain due to onychomycosis of toenails of both feet 07/28/2020   Paraesophageal hernia    PONV (postoperative nausea and vomiting)    PSVT (paroxysmal supraventricular tachycardia) 05/13/2018   a.) holter  05/13/2018 --> frequent runs of SVT noted during study   Pulmonary fibrosis (HCC)    Pulmonary hypertension (HCC)    S/P repair of paraesophageal hernia 11/14/2021   Sinus bradycardia    Squamous cell carcinoma of skin 11/12/2019   L distal dorsum forearm - ED&C    Vitamin D  deficiency 01/02/2023   Weight loss, non-intentional 04/15/2022    Current Medications: Current Meds  Medication Sig   acetaminophen  (TYLENOL ) 325 MG tablet Take 2 tablets (650 mg total) by mouth every 6 (six) hours as needed for mild pain (or Fever >/= 101).   Ascorbic Acid  (VITAMIN C  PO) Take by mouth daily at 12 noon.   aspirin  EC 81 MG tablet Take 1 tablet (81 mg total) by mouth daily.   atorvastatin  (LIPITOR) 20 MG tablet Take 1 tablet (20 mg total) by mouth at bedtime.   azithromycin  (ZITHROMAX ) 250 MG tablet Take 250 mg by mouth 3 (three) times a week.   Calcium  Carbonate-Vitamin D  500-125 MG-UNIT TABS Take 1 tablet by mouth daily.   cholecalciferol  (VITAMIN D ) 1000 units tablet Take 1,000 Units by mouth daily.   loratadine  (ALLERGY RELIEF) 10 MG tablet Take  1 tablet (10 mg total) by mouth at bedtime as needed.   losartan  (COZAAR ) 25 MG tablet Take 1 tablet (25 mg total) by mouth 2 (two) times daily.   Multiple Vitamin (MULTIVITAMIN) tablet Take 1 tablet by mouth 3 (three) times a week.   pantoprazole  (PROTONIX ) 40 MG tablet Take 1 tablet (40 mg total) by mouth daily.   predniSONE  (DELTASONE ) 2.5 MG tablet Take 2.5 mg by mouth.   torsemide  (DEMADEX ) 10 MG tablet Take 1 tablet (10 mg total) by mouth daily.    Allergies:   Bee venom, Codeine, Morphine and codeine, Shellfish allergy, Bactrim [sulfamethoxazole-trimethoprim], Norvasc  [amlodipine ], and Nitrofurantoin   Social History   Socioeconomic History   Marital status: Widowed    Spouse name: Not on file   Number of children: Not on file   Years of education: Not on file   Highest education level: Bachelor's degree (e.g., BA, AB, BS)  Occupational  History   Not on file  Tobacco Use   Smoking status: Never   Smokeless tobacco: Never  Vaping Use   Vaping status: Never Used  Substance and Sexual Activity   Alcohol use: No    Alcohol/week: 0.0 standard drinks of alcohol   Drug use: No   Sexual activity: Not Currently  Other Topics Concern   Not on file  Social History Narrative   L[place],  husband died 12/08/2018 due to brain hemorrhage   Daughter in law Shrinika Blatz    widowed   Social Drivers of Health   Financial Resource Strain: Low Risk  (11/18/2023)   Overall Financial Resource Strain (CARDIA)    Difficulty of Paying Living Expenses: Not hard at all  Food Insecurity: No Food Insecurity (11/18/2023)   Hunger Vital Sign    Worried About Running Out of Food in the Last Year: Never true    Ran Out of Food in the Last Year: Never true  Transportation Needs: No Transportation Needs (11/18/2023)   PRAPARE - Administrator, Civil Service (Medical): No    Lack of Transportation (Non-Medical): No  Physical Activity: Sufficiently Active (11/18/2023)   Exercise Vital Sign    Days of Exercise per Week: 6 days    Minutes of Exercise per Session: 60 min  Stress: No Stress Concern Present (11/18/2023)   [employer] of Occupational Health - Occupational Stress Questionnaire    Feeling of Stress: Not at all  Social Connections: Moderately Integrated (11/18/2023)   Social Connection and Isolation Panel    Frequency of Communication with Friends and Family: More than three times a week    Frequency of Social Gatherings with Friends and Family: More than three times a week    Attends Religious Services: More than 4 times per year    Active Member of [employer] or Organizations: Yes    Attends Banker Meetings: More than 4 times per year    Marital Status: Widowed     Family History:  The patient's family history includes Cancer in her brother; Colon cancer in her brother, maternal grandmother, and  maternal uncle; Coronary artery disease in her brother; Esophageal varices in her brother; Kidney disease in her brother. There is no history of Breast cancer.  ROS:   12-point review of systems is negative unless otherwise noted in the HPI.  EKGs/Other Studies Reviewed:    Studies reviewed were summarized above. The additional studies were reviewed today:  11/14/2023 Echo complete 1. Left ventricular ejection fraction, by estimation, is  60 to 65%. The  left ventricle has normal function. Left ventricular endocardial border  not optimally defined to evaluate regional wall motion. There is mild  asymmetric left ventricular  hypertrophy. Left ventricular diastolic parameters are consistent with  Grade II diastolic dysfunction (pseudonormalization). The average left  ventricular global longitudinal strain is -17.8 %. The global longitudinal  strain is normal.   2. Right ventricular systolic function is mildly reduced. The right  ventricular size is normal.   3. Mild mitral valve regurgitation. No evidence of mitral stenosis.   4. Tricuspid valve regurgitation is moderate.   EKG:  EKG personally reviewed by me today EKG Interpretation Date/Time:  Thursday November 21 2023 10:49:33 EST Ventricular Rate:  78 PR Interval:  126 QRS Duration:  74 QT Interval:  384 QTC Calculation: 437 R Axis:   -6  Text Interpretation: Normal sinus rhythm When compared with ECG of 14-Sep-2023 13:31, PREVIOUS ECG IS PRESENT Confirmed by Lorene Sinclair ([id number]) on 11/21/2023 11:14:58 AM  PHYSICAL EXAM:    VS:  BP 130/74 (BP Location: Left Arm, Patient Position: Sitting, Cuff Size: Normal)   Pulse 78 Comment: 75 oximeter  Ht 5' 5 (1.651 m)   Wt 102 lb 3.2 oz (46.4 kg)   LMP  (LMP Unknown)   SpO2 95%   BMI 17.01 kg/m   BMI: Body mass index is 17.01 kg/m.  GEN: Well nourished, well developed in no acute distress NECK: No JVD; No carotid bruits CARDIAC: RRR, no murmurs, rubs, gallops RESPIRATORY:   Clear to auscultation without rales, wheezing or rhonchi  ABDOMEN: Soft, non-tender, non-distended EXTREMITIES:  No edema; No deformity  Wt Readings from Last 3 Encounters:  11/21/23 102 lb 3.2 oz (46.4 kg)  09/26/23 103 lb 3.2 oz (46.8 kg)  09/14/23 95 lb (43.1 kg)                  ASSESSMENT & PLAN:   S/p surgical resection of atrial myxoma - No evidence of recurrent myxoma on recent echo 11/2023. Continue periodic monitoring.   Postop atrial fibrillation - No symptoms concerning for recurrence of arrhythmia. EKG today shows sinus rhythm.   Diastolic dysfunction - Echo 11/2023 with EF 60 to 65%, mild LVH, and G2 DD.  She appears euvolemic on exam.  She is continued on torsemide  10 mg daily per PCP.  Hyperlipidemia - Most recent lipid panel with LDL 84. Continued on atorvastatin  20 mg daily.   Essential hypertension - BP 140/80 and 130/74 on recheck.  Patient reports fluctuations of blood pressures per home readings.  She has appointment with PCP tomorrow to discuss possible medication adjustments.    Disposition: F/u with Dr. Darron or an APP in 6 months.   Medication Adjustments/Labs and Tests Ordered: Current medicines are reviewed at length with the patient today.  Concerns regarding medicines are outlined above. Medication changes, Labs and Tests ordered today are summarized above and listed in the Patient Instructions accessible in Encounters.   Bonney Sinclair Lorene, PA-C 11/21/2023 11:28 AM     [facility] [place] [address] [place], [place] [address] [phone]

## 2023-11-21 NOTE — Patient Instructions (Signed)
 Medication Instructions:  Your physician recommends that you continue on your current medications as directed. Please refer to the Current Medication list given to you today.  *If you need a refill on your cardiac medications before your next appointment, please call your pharmacy*  Lab Work: No labs ordered today  If you have labs (blood work) drawn today and your tests are completely normal, you will receive your results only by: MyChart Message (if you have MyChart) OR A paper copy in the mail If you have any lab test that is abnormal or we need to change your treatment, we will call you to review the results.  Testing/Procedures: No test ordered today   Follow-Up: At [facility], you and your health needs are our priority.  As part of our continuing mission to provide you with exceptional heart care, our providers are all part of one team.  This team includes your primary Cardiologist (physician) and Advanced Practice Providers or APPs (Physician Assistants and Nurse Practitioners) who all work together to provide you with the care you need, when you need it.  Your next appointment:   5 month(s) WITH  DR DARRON  Provider:   You may see Deatrice Darron, MD or one of the following Advanced Practice Providers on your designated Care Team:   Lonni Meager, NP Lesley Maffucci, PA-C Bernardino Bring, PA-C Cadence [place], PA-C Tylene Lunch, NP Barnie Hila, NP

## 2023-11-22 ENCOUNTER — Ambulatory Visit

## 2023-11-22 VITALS — BP 122/80 | HR 60 | Temp 97.8°F | Ht 61.0 in | Wt 102.6 lb

## 2023-11-22 DIAGNOSIS — E785 Hyperlipidemia, unspecified: Secondary | ICD-10-CM

## 2023-11-22 DIAGNOSIS — I10 Essential (primary) hypertension: Secondary | ICD-10-CM

## 2023-11-22 DIAGNOSIS — E871 Hypo-osmolality and hyponatremia: Secondary | ICD-10-CM | POA: Diagnosis not present

## 2023-11-22 DIAGNOSIS — Z79899 Other long term (current) drug therapy: Secondary | ICD-10-CM | POA: Diagnosis not present

## 2023-11-22 MED ORDER — LOSARTAN POTASSIUM 100 MG PO TABS
100.0000 mg | ORAL_TABLET | Freq: Every day | ORAL | 1 refills | Status: AC
Start: 2023-11-22 — End: ?

## 2023-11-22 NOTE — Assessment & Plan Note (Signed)
Chronic, asymptomatic.

## 2023-11-22 NOTE — Patient Instructions (Signed)
 Increase Losartan  to 100 mg once daily, take this at night time since you take Torsemide  10 mg in the morning. Let's repeat labs and follow in 2 months. Please continue to check BP at home, 3 times a week on average is okay. Please bring your BP cuff with you for your next appointment.

## 2023-11-22 NOTE — Assessment & Plan Note (Addendum)
 Check fasting lipid panel, lab ordered. Continue Atorvastatin  20 mg daily.  Orders:   Lipid panel; Future

## 2023-11-22 NOTE — Progress Notes (Signed)
 Established Patient Office Visit   Subjective  Patient ID: Wanda Nelson, female    DOB: 1931-11-06  Age: 89 y.o. MRN: [id number]  Chief Complaint  Patient presents with   Hypertension    Discussed the use of AI scribe software for clinical note transcription with the patient, who gave verbal consent to proceed.  History of Present Illness  Wanda Nelson is a 89 year old female who presents for hypertension follow up.   She has been checking BP at home almost daily, typically measuring it in the late afternoon. Her readings have been consistently in the mid-150s systolic range, with stable diastolic numbers. She reports that during her recent cardiology appointment, no specific recommendations regarding her blood pressure goal were provided.  She is currently taking Losartan  25 mg twice a day and torsemide  10 mg in the morning. She has a history of taking amlodipine , which caused lower leg edema, which resolved after discontinuation.   She takes Protonix  20 mg BID for GERD, has a h/o hiatal hernia. Under care of GI.  She also has a h/o chronic asymptomatic hyponatremia. She experiences shortness of breath after walking two miles, requiring rest. No chest pain, palpitations, abdominal pain, or leg swelling.   She is also under the care of a pulmonologist for management of chronic dyspnea with pulmonary fibrosis and is taking prednisone  2.5 mg and azithromycin  250 mg.  Her cough is at baseline and not bothersome. No weakness or dizziness.  She frequently bakes cookies and bread for her grandchildren and neighbors. She has a supportive family, including a daughter-in-law in medicine.     ROS As per HPI    Objective:     BP 122/80 (BP Location: Right Arm, Patient Position: Sitting, Cuff Size: Small)   Pulse 60   Temp 97.8 F (36.6 C) (Oral)   Ht 5' 1 (1.549 m)   Wt 102 lb 9.6 oz (46.5 kg)   LMP  (LMP Unknown)   SpO2 93%   BMI 19.39 kg/m      11/22/2023     8:11 AM 09/26/2023    8:16 AM 08/14/2023   11:10 AM  Depression screen PHQ 2/9  Decreased Interest 0 0 0  Down, Depressed, Hopeless 0 0 0  PHQ - 2 Score 0 0 0  Altered sleeping 0 0 0  Tired, decreased energy 0 0 1  Change in appetite 0 0 0  Feeling bad or failure about yourself  0 0 0  Trouble concentrating 0 0 0  Moving slowly or fidgety/restless 0 0 0  Suicidal thoughts 0 0 0  PHQ-9 Score 0 0  1   Difficult doing work/chores Not difficult at all Not difficult at all Somewhat difficult     Data saved with a previous flowsheet row definition      11/22/2023    8:11 AM 09/26/2023    8:16 AM 08/14/2023   11:10 AM 07/02/2023    3:13 PM  GAD 7 : Generalized Anxiety Score  Nervous, Anxious, on Edge 0 0 0 0  Control/stop worrying 0 0 0 0  Worry too much - different things 0 0 0 0  Trouble relaxing 0 0 0 0  Restless 0 0 0 0  Easily annoyed or irritable 0 0 0 0  Afraid - awful might happen 0 0 0 0  Total GAD 7 Score 0 0 0 0  Anxiety Difficulty Not difficult at all Not difficult at all Not difficult at all Not  difficult at all      11/22/2023    8:11 AM 09/26/2023    8:16 AM 08/14/2023   11:10 AM  Depression screen PHQ 2/9  Decreased Interest 0 0 0  Down, Depressed, Hopeless 0 0 0  PHQ - 2 Score 0 0 0  Altered sleeping 0 0 0  Tired, decreased energy 0 0 1  Change in appetite 0 0 0  Feeling bad or failure about yourself  0 0 0  Trouble concentrating 0 0 0  Moving slowly or fidgety/restless 0 0 0  Suicidal thoughts 0 0 0  PHQ-9 Score 0 0  1   Difficult doing work/chores Not difficult at all Not difficult at all Somewhat difficult     Data saved with a previous flowsheet row definition      11/22/2023    8:11 AM 09/26/2023    8:16 AM 08/14/2023   11:10 AM 07/02/2023    3:13 PM  GAD 7 : Generalized Anxiety Score  Nervous, Anxious, on Edge 0 0 0 0  Control/stop worrying 0 0 0 0  Worry too much - different things 0 0 0 0  Trouble relaxing 0 0 0 0  Restless 0 0 0 0  Easily  annoyed or irritable 0 0 0 0  Afraid - awful might happen 0 0 0 0  Total GAD 7 Score 0 0 0 0  Anxiety Difficulty Not difficult at all Not difficult at all Not difficult at all Not difficult at all   SDOH Screenings   Food Insecurity: No Food Insecurity (11/18/2023)  Housing: Low Risk  (11/18/2023)  Transportation Needs: No Transportation Needs (11/18/2023)  Utilities: Not At Risk (07/24/2023)  Alcohol Screen: Low Risk  (07/24/2023)  Depression (PHQ2-9): Low Risk  (11/22/2023)  Financial Resource Strain: Low Risk  (11/18/2023)  Physical Activity: Sufficiently Active (11/18/2023)  Social Connections: Moderately Integrated (11/18/2023)  Stress: No Stress Concern Present (11/18/2023)  Tobacco Use: Low Risk  (11/22/2023)  Health Literacy: Adequate Health Literacy (07/24/2023)     Physical Exam Constitutional:      General: She is not in acute distress. HENT:     Head: Normocephalic and atraumatic.     Mouth/Throat:     Mouth: Mucous membranes are moist.  Cardiovascular:     Rate and Rhythm: Normal rate.  Pulmonary:     Effort: Pulmonary effort is normal.     Breath sounds: Normal breath sounds.  Abdominal:     General: Bowel sounds are normal.     Palpations: Abdomen is soft.     Tenderness: There is no guarding.  Musculoskeletal:     Cervical back: Neck supple.     Right lower leg: No edema.     Left lower leg: No edema.  Lymphadenopathy:     Cervical: No cervical adenopathy.  Neurological:     Mental Status: She is alert and oriented to person, place, and time.     Gait: Gait abnormal (slow careful gait).  Psychiatric:        Mood and Affect: Mood normal.        No results found for any visits on 11/22/23.  The ASCVD Risk score (Arnett DK, et al., 2019) failed to calculate for the following reasons:   The 2019 ASCVD risk score is only valid for ages 77 to 110   Risk score cannot be calculated because patient has a medical history suggesting prior/existing ASCVD      Assessment & Plan:   Assessment &  Plan Primary hypertension Blood pressure consistently above 140/90 mmHg at home.  Her BP is within goal today.  Given patient's age, co-morbidities, goal BP <140/90 mmHg to reduce risk of hypotension and associated adverse outcome.  Increased losartan  to 100 mg once daily in the evening. Continue torsemide  10 mg in the morning. Check blood pressure at random times, ensuring to sit for 5 minutes before measurement. Bring blood pressure cuff and readings to next appointment. Check CMP before her next appointment with me. Scheduled follow-up appointment in two months. Orders:   Comp Met (CMET); Future  Hyperlipidemia, unspecified hyperlipidemia type Check fasting lipid panel, lab ordered. Continue Atorvastatin  20 mg daily.  Orders:   Lipid panel; Future  Medication management On Protonix  for GERD, check B12 level. Currently taking multivitamin 3 times a week.  Orders:   B12; Future  Hyponatremia Chronic, asymptomatic.       Return in about 2 months (around 01/22/2024) for chronic follow up, fasting labs 2 days before apt.   Luke Shade, MD

## 2023-11-22 NOTE — Assessment & Plan Note (Addendum)
 Blood pressure consistently above 140/90 mmHg at home.  Her BP is within goal today.  Given patient's age, co-morbidities, goal BP <140/90 mmHg to reduce risk of hypotension and associated adverse outcome.  Increased losartan  to 100 mg once daily in the evening. Continue torsemide  10 mg in the morning. Check blood pressure at random times, ensuring to sit for 5 minutes before measurement. Bring blood pressure cuff and readings to next appointment. Check CMP before her next appointment with me. Scheduled follow-up appointment in two months. Orders:   Comp Met (CMET); Future

## 2023-11-25 ENCOUNTER — Ambulatory Visit: Payer: Self-pay | Admitting: Cardiovascular Disease

## 2023-11-29 ENCOUNTER — Encounter: Payer: Self-pay | Admitting: Podiatry

## 2023-11-29 ENCOUNTER — Ambulatory Visit (INDEPENDENT_AMBULATORY_CARE_PROVIDER_SITE_OTHER): Admitting: Podiatry

## 2023-11-29 DIAGNOSIS — M79674 Pain in right toe(s): Secondary | ICD-10-CM

## 2023-11-29 DIAGNOSIS — B351 Tinea unguium: Secondary | ICD-10-CM | POA: Diagnosis not present

## 2023-11-29 DIAGNOSIS — M79675 Pain in left toe(s): Secondary | ICD-10-CM

## 2023-12-07 NOTE — Progress Notes (Signed)
  Subjective:  Patient ID: Wanda Nelson, female    DOB: 12-21-1931,  MRN: [id number]  Wanda Nelson presents to clinic today for painful mycotic toenails of both feet that are difficult to trim. Pain interferes with daily activities and wearing enclosed shoe gear comfortably.  Chief Complaint  Patient presents with   Toe Pain    She saw Dr. Abbey in Nov. She denies being diabetic   New problem(s): None.   PCP is Bair, Luke, MD.  Allergies  Allergen Reactions   Bee Venom Anaphylaxis and Hives   Codeine Other (See Comments)    Altered mental status   Morphine And Codeine     Altered mental status   Shellfish Allergy Anaphylaxis and Hives    Mainly shrimp   Bactrim [Sulfamethoxazole-Trimethoprim]     ? Reaction     Norvasc  [Amlodipine ]     2.5 leg edema    Nitrofurantoin Rash    Review of Systems: Negative except as noted in the HPI.  Objective: No changes noted in today's physical examination. There were no vitals filed for this visit. Wanda Nelson is a pleasant 89 y.o. female in NAD. AAO x 3.  Vascular Examination: Capillary refill time <3 seconds b/l LE. Palpable pedal pulses b/l LE. Digital hair present b/l. No pedal edema b/l. Skin temperature gradient WNL b/l. No varicosities b/l. No cyanosis or clubbing. No ischemia or gangrene. .  Dermatological Examination: Pedal skin with normal turgor, texture and tone b/l. No open wounds. No interdigital macerations b/l. Toenails 1-5 b/l thickened, discolored, dystrophic with subungual debris. There is pain on palpation to dorsal aspect of nailplates. No hyperkeratotic nor porokeratotic lesions.   Neurological Examination: Protective sensation intact with 10 gram monofilament b/l LE. Vibratory sensation intact b/l LE.   Musculoskeletal Examination: Normal muscle strength 5/5 to all lower extremity muscle groups bilaterally. No pain, crepitus or joint limitation noted with ROM b/l LE. No gross bony  pedal deformities b/l. Patient ambulates independently without assistive aids.  Assessment/Plan: 1. Pain due to onychomycosis of toenails of both feet   Consent given for treatment. Patient examined. All patient's and/or POA's questions/concerns addressed on today's visit. Mycotic toenails 1-5 b/l debrided in length and girth without incident. Continue soft, supportive shoe gear daily. Report any pedal injuries to medical professional. Call office if there are any quesitons/concerns. -Patient/POA to call should there be question/concern in the interim.   Return in about 3 months (around 02/29/2024).  Delon LITTIE Merlin, DPM      [facility] LOCATION: [address] [place] [address]                   Office [phone]   [facility] LOCATION: [address] [place], [place] [address] Office [phone]

## 2023-12-09 DIAGNOSIS — K582 Mixed irritable bowel syndrome: Secondary | ICD-10-CM | POA: Diagnosis not present

## 2023-12-12 ENCOUNTER — Ambulatory Visit

## 2024-01-27 ENCOUNTER — Other Ambulatory Visit (INDEPENDENT_AMBULATORY_CARE_PROVIDER_SITE_OTHER)

## 2024-01-27 ENCOUNTER — Ambulatory Visit: Payer: Self-pay

## 2024-01-27 DIAGNOSIS — I10 Essential (primary) hypertension: Secondary | ICD-10-CM

## 2024-01-27 DIAGNOSIS — E785 Hyperlipidemia, unspecified: Secondary | ICD-10-CM | POA: Diagnosis not present

## 2024-01-27 DIAGNOSIS — Z79899 Other long term (current) drug therapy: Secondary | ICD-10-CM | POA: Diagnosis not present

## 2024-01-27 LAB — COMPREHENSIVE METABOLIC PANEL WITH GFR
ALT: 11 U/L (ref 3–35)
AST: 19 U/L (ref 5–37)
Albumin: 4.2 g/dL (ref 3.5–5.2)
Alkaline Phosphatase: 70 U/L (ref 39–117)
BUN: 19 mg/dL (ref 6–23)
CO2: 28 meq/L (ref 19–32)
Calcium: 9.8 mg/dL (ref 8.4–10.5)
Chloride: 99 meq/L (ref 96–112)
Creatinine, Ser: 0.84 mg/dL (ref 0.40–1.20)
GFR: 61 mL/min
Glucose, Bld: 99 mg/dL (ref 70–99)
Potassium: 4.1 meq/L (ref 3.5–5.1)
Sodium: 136 meq/L (ref 135–145)
Total Bilirubin: 0.6 mg/dL (ref 0.2–1.2)
Total Protein: 7.3 g/dL (ref 6.0–8.3)

## 2024-01-27 LAB — LIPID PANEL
Cholesterol: 155 mg/dL (ref 28–200)
HDL: 60.9 mg/dL
LDL Cholesterol: 73 mg/dL (ref 10–99)
NonHDL: 93.85
Total CHOL/HDL Ratio: 3
Triglycerides: 104 mg/dL (ref 10.0–149.0)
VLDL: 20.8 mg/dL (ref 0.0–40.0)

## 2024-01-27 LAB — VITAMIN B12: Vitamin B-12: 687 pg/mL (ref 211–911)

## 2024-01-28 NOTE — Progress Notes (Signed)
 Hold results to discuss during upcoming office visit.   Luke Shade, MD

## 2024-01-29 ENCOUNTER — Ambulatory Visit

## 2024-01-29 VITALS — BP 124/86 | HR 75 | Temp 98.3°F | Ht 61.0 in | Wt 98.6 lb

## 2024-01-29 DIAGNOSIS — I10 Essential (primary) hypertension: Secondary | ICD-10-CM | POA: Diagnosis not present

## 2024-01-29 DIAGNOSIS — E7800 Pure hypercholesterolemia, unspecified: Secondary | ICD-10-CM | POA: Diagnosis not present

## 2024-01-29 MED ORDER — TORSEMIDE 10 MG PO TABS: 10.0000 mg | ORAL_TABLET | Freq: Every day | ORAL | 1 refills | Status: AC

## 2024-01-29 NOTE — Assessment & Plan Note (Signed)
 Continue atorvastatin  20 mg daily at bedtime.  Recent lipid panel reviewed, LDL within goal.

## 2024-01-29 NOTE — Progress Notes (Signed)
 Lab results reviewed during office visit on 01/29/2024  Wanda Shade, MD

## 2024-01-29 NOTE — Progress Notes (Signed)
 "  Established Patient Office Visit   Subjective  Patient ID: Wanda Nelson, female    DOB: [birth year]  Age: 89 y.o. MRN: [id number]  Chief Complaint  Patient presents with   Hypertension    Discussed the use of AI scribe software for clinical note transcription with the patient, who gave verbal consent to proceed.  History of Present Illness [name] is a 89 year old female with hypertension who presents for blood pressure management. She is accompanied by her daughter-in-law, Donzell.  She has been experiencing elevated blood pressure readings, often over 150/90 mmHg, despite regular monitoring at home using a blood pressure cuff. She checks her blood pressure two to three times a week. Her medication regimen includes a blood pressure pill taken in the morning along with torsemide , a diuretic. She stays hydrated by drinking water before and after her morning walk.  She has a history of pulmonary fibrosis and is under the care of a pulmonologist. She experiences shortness of breath, which is influenced by weather conditions, particularly on cloudy days and when walking uphill. She uses Breyna as needed, though not daily. Cold air also affects her joints.  She has gastrointestinal issues, including IBS, and takes Miralax  as needed. She is increasing her fiber intake by eating more salads. Her bowel movements are irregular, sometimes occurring multiple times a day and other times not at all.  She reports occasional swelling and stiffness in her right foot, which improves with movement. This has been occurring for about a month, and she is unsure if her sleeping position affects it.  Her medication regimen includes torsemide  10 mg in the morning, losartan  100 mg daily.      ROS As per HPI    Objective:     BP 124/86 (BP Location: Left Arm, Patient Position: Sitting)   Pulse 75   Temp 98.3 F (36.8 C) (Oral)   Ht 5' 1 (1.549 m)   Wt 98 lb 9.6 oz (44.7 kg)   LMP   (LMP Unknown)   SpO2 92%   BMI 18.63 kg/m      01/29/2024    4:08 PM 11/22/2023    8:11 AM 09/26/2023    8:16 AM  Depression screen PHQ 2/9  Decreased Interest 0 0 0  Down, Depressed, Hopeless 0 0 0  PHQ - 2 Score 0 0 0  Altered sleeping 0 0 0  Tired, decreased energy 0 0 0  Change in appetite 0 0 0  Feeling bad or failure about yourself  0 0 0  Trouble concentrating 0 0 0  Moving slowly or fidgety/restless 0 0 0  Suicidal thoughts 0 0 0  PHQ-9 Score 0 0 0   Difficult doing work/chores Not difficult at all Not difficult at all Not difficult at all     Data saved with a previous flowsheet row definition      01/29/2024    4:08 PM 11/22/2023    8:11 AM 09/26/2023    8:16 AM 08/14/2023   11:10 AM  GAD 7 : Generalized Anxiety Score  Nervous, Anxious, on Edge 0 0  0  0   Control/stop worrying 0 0  0  0   Worry too much - different things 0 0  0  0   Trouble relaxing 0 0  0  0   Restless 0 0  0  0   Easily annoyed or irritable 0 0  0  0   Afraid - awful might  happen 0 0  0  0   Total GAD 7 Score 0 0 0 0  Anxiety Difficulty Not difficult at all Not difficult at all Not difficult at all Not difficult at all     Data saved with a previous flowsheet row definition      01/29/2024    4:08 PM 11/22/2023    8:11 AM 09/26/2023    8:16 AM  Depression screen PHQ 2/9  Decreased Interest 0 0 0  Down, Depressed, Hopeless 0 0 0  PHQ - 2 Score 0 0 0  Altered sleeping 0 0 0  Tired, decreased energy 0 0 0  Change in appetite 0 0 0  Feeling bad or failure about yourself  0 0 0  Trouble concentrating 0 0 0  Moving slowly or fidgety/restless 0 0 0  Suicidal thoughts 0 0 0  PHQ-9 Score 0 0 0   Difficult doing work/chores Not difficult at all Not difficult at all Not difficult at all     Data saved with a previous flowsheet row definition      01/29/2024    4:08 PM 11/22/2023    8:11 AM 09/26/2023    8:16 AM 08/14/2023   11:10 AM  GAD 7 : Generalized Anxiety Score  Nervous, Anxious,  on Edge 0 0  0  0   Control/stop worrying 0 0  0  0   Worry too much - different things 0 0  0  0   Trouble relaxing 0 0  0  0   Restless 0 0  0  0   Easily annoyed or irritable 0 0  0  0   Afraid - awful might happen 0 0  0  0   Total GAD 7 Score 0 0 0 0  Anxiety Difficulty Not difficult at all Not difficult at all Not difficult at all Not difficult at all     Data saved with a previous flowsheet row definition   SDOH Screenings   Food Insecurity: No Food Insecurity (11/18/2023)  Housing: Low Risk (11/18/2023)  Transportation Needs: No Transportation Needs (11/18/2023)  Utilities: Not At Risk (07/24/2023)  Alcohol Screen: Low Risk (07/24/2023)  Depression (PHQ2-9): Low Risk (01/29/2024)  Financial Resource Strain: Low Risk (11/18/2023)  Physical Activity: Sufficiently Active (11/18/2023)  Social Connections: Moderately Integrated (11/18/2023)  Stress: No Stress Concern Present (11/18/2023)  Tobacco Use: Low Risk (01/29/2024)  Health Literacy: Adequate Health Literacy (07/24/2023)     Physical Exam Constitutional:      General: She is not in acute distress. HENT:     Head: Normocephalic and atraumatic.     Mouth/Throat:     Mouth: Mucous membranes are moist.  Cardiovascular:     Rate and Rhythm: Normal rate.  Pulmonary:     Effort: Pulmonary effort is normal.     Breath sounds: Normal breath sounds.  Abdominal:     Palpations: Abdomen is soft.     Tenderness: There is no abdominal tenderness.  Musculoskeletal:     Cervical back: Neck supple.     Right lower leg: No edema.     Left lower leg: No edema.  Neurological:     Mental Status: She is alert and oriented to person, place, and time.  Psychiatric:        Mood and Affect: Mood normal.        No results found for any visits on 01/29/24.  The ASCVD Risk score (Arnett DK, et al., 2019) failed to  calculate for the following reasons:   The 2019 ASCVD risk score is only valid for ages 41 to 61   Risk score cannot  be calculated because patient has a medical history suggesting prior/existing ASCVD   * - Cholesterol units were assumed    Following lab results discussed:  Component     Latest Ref Rng 01/27/2024  Sodium     135 - 145 mEq/L 136   Potassium     3.5 - 5.1 mEq/L 4.1   Chloride     96 - 112 mEq/L 99   CO2     19 - 32 mEq/L 28   Glucose     70 - 99 mg/dL 99   BUN     6 - 23 mg/dL 19   Creatinine     9.59 - 1.20 mg/dL 9.15   Total Bilirubin     0.2 - 1.2 mg/dL 0.6   Alkaline Phosphatase     39 - 117 U/L 70   AST     5 - 37 U/L 19   ALT     3 - 35 U/L 11   Total Protein     6.0 - 8.3 g/dL 7.3   Albumin      3.5 - 5.2 g/dL 4.2   GFR     >39.99 mL/min 61.00   Calcium      8.4 - 10.5 mg/dL 9.8   Cholesterol     28 - 200 mg/dL 844   Triglycerides     10.0 - 149.0 mg/dL 895.9   HDL Cholesterol     >39.00 mg/dL 39.09   VLDL     0.0 - 40.0 mg/dL 79.1   LDL (calc)     10 - 99 mg/dL 73   Total CHOL/HDL Ratio 3   NonHDL 93.85   Vitamin B12     211 - 911 pg/mL 687     Assessment & Plan:   Assessment & Plan Primary hypertension Home blood pressure cuff showing readings > 10 mmHg higher than clinic reading.  Clinic BP with in goal.  Update blood pressure cuff to a new brand (recommend Omron 3) Monitor blood pressure 2-3 times a week. Report any new symptoms such as chest heaviness, palpitations, or shortness of breath. Continue Torsemide  10 mg daily (future refill sent) and Losartan  100 mg daily.  Given patient's age, co-morbidities, goal BP <150/90 mmHg.  Reviewed stable renal function from 01/27/2024. Orders:   torsemide  (DEMADEX ) 10 MG tablet; Take 1 tablet (10 mg total) by mouth daily.  Pure hypercholesterolemia Continue atorvastatin  20 mg daily at bedtime.  Recent lipid panel reviewed, LDL within goal.     Return in about 3 months (around 04/28/2024) for Chronic follow up .   Luke Shade, MD "

## 2024-01-29 NOTE — Patient Instructions (Addendum)
 Omron 3 BP cuff is good blood pressure cuff.  Check BP 2-3 times a week.  Continue current medication.  Follow up in 3 months.

## 2024-01-29 NOTE — Assessment & Plan Note (Addendum)
 Home blood pressure cuff showing readings > 10 mmHg higher than clinic reading.  Clinic BP with in goal.  Update blood pressure cuff to a new brand (recommend Omron 3) Monitor blood pressure 2-3 times a week. Report any new symptoms such as chest heaviness, palpitations, or shortness of breath. Continue Torsemide  10 mg daily (future refill sent) and Losartan  100 mg daily.  Given patient's age, co-morbidities, goal BP <150/90 mmHg.  Reviewed stable renal function from 01/27/2024. Orders:   torsemide  (DEMADEX ) 10 MG tablet; Take 1 tablet (10 mg total) by mouth daily.

## 2024-03-06 ENCOUNTER — Ambulatory Visit: Admitting: Podiatry

## 2024-04-29 ENCOUNTER — Ambulatory Visit

## 2024-07-27 ENCOUNTER — Ambulatory Visit
